# Patient Record
Sex: Male | Born: 1935
Health system: Southern US, Community
[De-identification: ages and names within clinical notes are randomized; demographics above are authoritative.]

## PROBLEM LIST (undated history)

## (undated) DIAGNOSIS — N189 Chronic kidney disease, unspecified: Secondary | ICD-10-CM

## (undated) DIAGNOSIS — R001 Bradycardia, unspecified: Secondary | ICD-10-CM

## (undated) DIAGNOSIS — D708 Other neutropenia: Secondary | ICD-10-CM

## (undated) DIAGNOSIS — D329 Benign neoplasm of meninges, unspecified: Secondary | ICD-10-CM

## (undated) DIAGNOSIS — G4762 Sleep related leg cramps: Secondary | ICD-10-CM

## (undated) DIAGNOSIS — Z8546 Personal history of malignant neoplasm of prostate: Secondary | ICD-10-CM

## (undated) DIAGNOSIS — G478 Other sleep disorders: Secondary | ICD-10-CM

## (undated) DIAGNOSIS — E785 Hyperlipidemia, unspecified: Secondary | ICD-10-CM

## (undated) DIAGNOSIS — G7000 Myasthenia gravis without (acute) exacerbation: Secondary | ICD-10-CM

## (undated) DIAGNOSIS — I82409 Acute embolism and thrombosis of unspecified deep veins of unspecified lower extremity: Secondary | ICD-10-CM

## (undated) DIAGNOSIS — R569 Unspecified convulsions: Secondary | ICD-10-CM

## (undated) DIAGNOSIS — I499 Cardiac arrhythmia, unspecified: Secondary | ICD-10-CM

## (undated) DIAGNOSIS — M47812 Spondylosis without myelopathy or radiculopathy, cervical region: Secondary | ICD-10-CM

## (undated) DIAGNOSIS — I4891 Unspecified atrial fibrillation: Secondary | ICD-10-CM

## (undated) DIAGNOSIS — T8859XA Other complications of anesthesia, initial encounter: Secondary | ICD-10-CM

## (undated) DIAGNOSIS — C801 Malignant (primary) neoplasm, unspecified: Secondary | ICD-10-CM

## (undated) DIAGNOSIS — G629 Polyneuropathy, unspecified: Secondary | ICD-10-CM

## (undated) DIAGNOSIS — L120 Bullous pemphigoid: Secondary | ICD-10-CM

## (undated) DIAGNOSIS — I10 Essential (primary) hypertension: Secondary | ICD-10-CM

## (undated) DIAGNOSIS — Z87442 Personal history of urinary calculi: Secondary | ICD-10-CM

## (undated) DIAGNOSIS — E041 Nontoxic single thyroid nodule: Secondary | ICD-10-CM

## (undated) DIAGNOSIS — M1990 Unspecified osteoarthritis, unspecified site: Secondary | ICD-10-CM

## (undated) HISTORY — DX: Myasthenia gravis without (acute) exacerbation: G70.00

## (undated) HISTORY — DX: Hyperlipidemia, unspecified: E78.5

## (undated) HISTORY — PX: PARTIAL COLECTOMY: SHX5273

## (undated) HISTORY — DX: Unspecified atrial fibrillation: I48.91

## (undated) HISTORY — DX: Nontoxic single thyroid nodule: E04.1

## (undated) HISTORY — PX: BRAIN SURGERY: SHX531

## (undated) HISTORY — DX: Personal history of malignant neoplasm of prostate: Z85.46

## (undated) HISTORY — DX: Other sleep disorders: G47.8

## (undated) HISTORY — DX: Bullous pemphigoid: L12.0

## (undated) HISTORY — PX: BLADDER SURGERY: SHX569

## (undated) HISTORY — DX: Sleep related leg cramps: G47.62

## (undated) HISTORY — DX: Unspecified convulsions: R56.9

## (undated) HISTORY — PX: RADIOACTIVE SEED IMPLANT: SHX5150

## (undated) HISTORY — PX: CHOLECYSTECTOMY: SHX55

## (undated) HISTORY — DX: Spondylosis without myelopathy or radiculopathy, cervical region: M47.812

---
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
## 1998-09-23 ENCOUNTER — Encounter: Payer: Self-pay | Admitting: Neurology

## 1998-09-23 ENCOUNTER — Ambulatory Visit (HOSPITAL_COMMUNITY): Admission: RE | Admit: 1998-09-23 | Discharge: 1998-09-23 | Payer: Self-pay | Admitting: Neurology

## 2001-02-08 ENCOUNTER — Encounter: Payer: Self-pay | Admitting: Emergency Medicine

## 2001-02-08 ENCOUNTER — Emergency Department (HOSPITAL_COMMUNITY): Admission: EM | Admit: 2001-02-08 | Discharge: 2001-02-08 | Payer: Self-pay | Admitting: Emergency Medicine

## 2001-04-25 ENCOUNTER — Ambulatory Visit (HOSPITAL_COMMUNITY): Admission: RE | Admit: 2001-04-25 | Discharge: 2001-04-25 | Payer: Self-pay | Admitting: General Surgery

## 2001-12-03 ENCOUNTER — Encounter: Payer: Self-pay | Admitting: Emergency Medicine

## 2001-12-03 ENCOUNTER — Emergency Department (HOSPITAL_COMMUNITY): Admission: EM | Admit: 2001-12-03 | Discharge: 2001-12-03 | Payer: Self-pay | Admitting: Emergency Medicine

## 2002-08-24 ENCOUNTER — Encounter: Payer: Self-pay | Admitting: Neurosurgery

## 2002-08-24 ENCOUNTER — Ambulatory Visit (HOSPITAL_COMMUNITY): Admission: RE | Admit: 2002-08-24 | Discharge: 2002-08-24 | Payer: Self-pay | Admitting: Neurosurgery

## 2003-04-17 ENCOUNTER — Ambulatory Visit (HOSPITAL_COMMUNITY): Admission: RE | Admit: 2003-04-17 | Discharge: 2003-04-17 | Payer: Self-pay | Admitting: Family Medicine

## 2004-08-29 ENCOUNTER — Ambulatory Visit (HOSPITAL_COMMUNITY): Admission: RE | Admit: 2004-08-29 | Discharge: 2004-08-29 | Payer: Self-pay | Admitting: Family Medicine

## 2005-04-16 ENCOUNTER — Other Ambulatory Visit: Admission: RE | Admit: 2005-04-16 | Discharge: 2005-04-16 | Payer: Self-pay | Admitting: Dermatology

## 2005-05-31 ENCOUNTER — Encounter: Admission: RE | Admit: 2005-05-31 | Discharge: 2005-05-31 | Payer: Self-pay | Admitting: Neurology

## 2005-09-03 ENCOUNTER — Ambulatory Visit (HOSPITAL_COMMUNITY): Admission: RE | Admit: 2005-09-03 | Discharge: 2005-09-03 | Payer: Self-pay | Admitting: Neurology

## 2005-11-20 ENCOUNTER — Encounter: Admission: RE | Admit: 2005-11-20 | Discharge: 2005-11-20 | Payer: Self-pay | Admitting: Neurology

## 2007-06-09 ENCOUNTER — Ambulatory Visit (HOSPITAL_COMMUNITY): Admission: RE | Admit: 2007-06-09 | Discharge: 2007-06-09 | Payer: Self-pay | Admitting: Family Medicine

## 2007-06-13 ENCOUNTER — Ambulatory Visit (HOSPITAL_COMMUNITY): Admission: RE | Admit: 2007-06-13 | Discharge: 2007-06-13 | Payer: Self-pay | Admitting: Family Medicine

## 2007-06-29 ENCOUNTER — Encounter (INDEPENDENT_AMBULATORY_CARE_PROVIDER_SITE_OTHER): Payer: Self-pay | Admitting: General Surgery

## 2007-06-29 ENCOUNTER — Ambulatory Visit (HOSPITAL_COMMUNITY): Admission: RE | Admit: 2007-06-29 | Discharge: 2007-06-30 | Payer: Self-pay | Admitting: General Surgery

## 2008-03-07 ENCOUNTER — Ambulatory Visit: Admission: RE | Admit: 2008-03-07 | Discharge: 2008-03-20 | Payer: Self-pay | Admitting: Radiation Oncology

## 2008-04-02 ENCOUNTER — Ambulatory Visit (HOSPITAL_COMMUNITY): Admission: RE | Admit: 2008-04-02 | Discharge: 2008-04-02 | Payer: Self-pay | Admitting: General Surgery

## 2008-04-26 ENCOUNTER — Ambulatory Visit: Admission: RE | Admit: 2008-04-26 | Discharge: 2008-07-25 | Payer: Self-pay | Admitting: Radiation Oncology

## 2008-05-17 ENCOUNTER — Ambulatory Visit (HOSPITAL_COMMUNITY): Admission: RE | Admit: 2008-05-17 | Discharge: 2008-05-17 | Payer: Self-pay | Admitting: Family Medicine

## 2008-06-29 ENCOUNTER — Ambulatory Visit (HOSPITAL_BASED_OUTPATIENT_CLINIC_OR_DEPARTMENT_OTHER): Admission: RE | Admit: 2008-06-29 | Discharge: 2008-06-29 | Payer: Self-pay | Admitting: Urology

## 2008-08-06 ENCOUNTER — Ambulatory Visit: Admission: RE | Admit: 2008-08-06 | Discharge: 2008-09-04 | Payer: Self-pay | Admitting: Radiation Oncology

## 2010-07-24 LAB — COMPREHENSIVE METABOLIC PANEL
Chloride: 102 mEq/L (ref 96–112)
Creatinine, Ser: 0.63 mg/dL (ref 0.4–1.5)
GFR calc Af Amer: >60 mL/min (ref >60)
Glucose, Bld: 95 mg/dL (ref 70–99)
Potassium: 4.7 mEq/L (ref 3.5–5.1)
Total Bilirubin: 0.8 mg/dL (ref 0.3–1.2)

## 2010-07-24 LAB — PROTIME-INR: INR: 1 (ref 0.00–1.49)

## 2010-07-24 LAB — APTT: aPTT: 26 seconds (ref 24–37)

## 2010-07-24 LAB — CBC
HCT: 45.3 % (ref 39.0–52.0)
Hemoglobin: 15.7 g/dL (ref 13.0–17.0)
Platelets: 187 10*3/uL (ref 150–400)
RDW: 12.9 % (ref 11.5–15.5)

## 2010-08-26 NOTE — H&P (Signed)
NAMETAVIS, KRING NO.:  [id number]   MEDICAL RECORD NO.:  [id number]          PATIENT TYPE:  AMB   LOCATION:  DAY                           FACILITY:  [facility]   PHYSICIAN:  Dalia Heading, M.D.  DATE OF BIRTH:  11-05-1934   DATE OF ADMISSION:  04/02/2008  DATE OF DISCHARGE:  LH                              HISTORY & PHYSICAL   CHIEF COMPLAINT:  Hematochezia.   HISTORY OF PRESENT ILLNESS:  The patient is a 76 year old white male who  was referred for endoscopic evaluation.  He needs a colonoscopy for  recent hematochezia.  He noted some blood on the toilet paper when he  wiped himself.  He is about to undergo radiation seed placement for  prostate carcinoma.  No abdominal pain, weight loss, nausea, vomiting,  diarrhea, constipation, melena.  Has been noted he did have a history  diverticulosis as per our colonoscopy in 2003.  There is no family  history of colon carcinoma.   PAST MEDICAL HISTORY:  Seizure disorder, hypertension, prostate cancer,  bone pain, myasthenia gravis.   PAST SURGICAL HISTORY:  Craniotomy, cholecystectomy.   CURRENT MEDICATIONS:  Dilantin, atenolol, diclofenac, simvastatin,  pyridostigmine, niacin, tetracycline.   ALLERGIES:  SULFA, IV DYE.   REVIEW OF SYSTEMS:  Noncontributory.   PHYSICAL EXAMINATION:  GENERAL:  The patient is a well-developed, well-  nourished white male in no acute distress.  LUNGS:  Clear to auscultation with equal breath sounds bilaterally.  HEART:  Regular rate and rhythm without S3, S4 or murmurs.  ABDOMEN:  Soft, nontender, nondistended.  No hepatosplenomegaly or  masses noted.  RECTAL:  Deferred to the procedure.   IMPRESSION:  Hematochezia.   PLAN:  The patient is scheduled for a colonoscopy on April 02, 2008.  The risks and benefits of the procedure including bleeding and  perforation were fully explained to the patient, who gave informed  consent.      Dalia Heading, M.D.  Electronically Signed     MAJ/MEDQ  D:  03/22/2008  T:  03/23/2008  Job:  [id number]   cc:   Kirk Ruths, M.D.  Fax: [phone]   [facility]  Fax: [phone]

## 2010-08-26 NOTE — H&P (Signed)
[name], [name] NO.:  [id number]   MEDICAL RECORD NO.:  [id number]          PATIENT TYPE:  AMB   LOCATION:  DAY                           FACILITY:  [facility]   PHYSICIAN:  Dalia Heading, M.D.  DATE OF BIRTH:  04/16/1934   DATE OF ADMISSION:  04/02/2008  DATE OF DISCHARGE:  LH                              HISTORY & PHYSICAL   CHIEF COMPLAINT:  Hematochezia.   HISTORY OF PRESENT ILLNESS:  The patient is a 76 year old white male who  was referred for endoscopic evaluation.  He needs a colonoscopy for  recent hematochezia.  He noted some blood on the toilet paper when he  wiped himself.  He is about to undergo radiation seed placement for  prostate carcinoma.  No abdominal pain, weight loss, nausea, vomiting,  diarrhea, constipation, melena.  Has been noted he did have a history  diverticulosis as per our colonoscopy in 2003.  There is no family  history of colon carcinoma.   PAST MEDICAL HISTORY:  Seizure disorder, hypertension, prostate cancer,  bone pain, myasthenia gravis.   PAST SURGICAL HISTORY:  Craniotomy, cholecystectomy.   CURRENT MEDICATIONS:  Dilantin, atenolol, diclofenac, simvastatin,  pyridostigmine, niacin, tetracycline.   ALLERGIES:  SULFA, IV DYE.   REVIEW OF SYSTEMS:  Noncontributory.   PHYSICAL EXAMINATION:  GENERAL:  The patient is a well-developed, well-  nourished white male in no acute distress.  LUNGS:  Clear to auscultation with equal breath sounds bilaterally.  HEART:  Regular rate and rhythm without S3, S4 or murmurs.  ABDOMEN:  Soft, nontender, nondistended.  No hepatosplenomegaly or  masses noted.  RECTAL:  Deferred to the procedure.   IMPRESSION:  Hematochezia.   PLAN:  The patient is scheduled for a colonoscopy on April 02, 2008.  The risks and benefits of the procedure including bleeding and  perforation were fully explained to the patient, who gave informed  consent.      Dalia Heading, M.D.     MAJ/MEDQ  D:  03/22/2008  T:  03/23/2008  Job:  [id number]   cc:   Kyle [name], M.D.  Fax: [phone]   [name]  Fax: [phone]

## 2010-08-26 NOTE — Op Note (Signed)
NAMEALBARO, DEVINEY NO.:  [id number]   MEDICAL RECORD NO.:  [id number]          PATIENT TYPE:  OIB   LOCATION:  A326                          FACILITY:  [facility]   PHYSICIAN:  Barbaraann Barthel, M.D. DATE OF BIRTH:  May 01, 1934   DATE OF PROCEDURE:  06/29/2007  DATE OF DISCHARGE:                               OPERATIVE REPORT   SURGEON:  Barbaraann Barthel, M.D.   PREOPERATIVE DIAGNOSES:  1. Cholecystitis.  2. Cholelithiasis.   POSTOPERATIVE DIAGNOSES:  1. Cholecystitis.  2. Cholelithiasis.   PROCEDURE:  Laparoscopic cholecystectomy (no cholangiogram).   SPECIMEN:  Gallbladder with stones.   NOTE:  This is a 76 year old white male who was self-referred for a  history of almost 2 years of right upper quadrant pain accompanied with  bouts of nausea and vomiting.  His liver function was grossly within  normal limits.  A sonogram showed the presence of cholelithiasis.  We  discussed laparoscopic cholecystectomy with him in detail, discussing  complications not limited to but including bleeding, infection, damage  to bile ducts, perforation of organs and transitory diarrhea and the  possibility that open surgery might be required.   Informed consent was obtained.   TECHNIQUE:  The patient was placed in supine position.  After the  adequate administration of general anesthesia via endotracheal  intubation, his entire abdomen was prepped with Betadine solution and  draped in the usual manner.  Prior to this a Foley catheter was  aseptically inserted.  With the patient Trendelenburg, a periumbilical  incision was carried out over the superior aspect of the umbilicus.  The  fascia was identified and elevated with a sharp towel clip and then a  Veress needle was inserted and confirmed in position with a saline drop  test.  We then placed an 11-mm cannula in the umbilicus incision and  then under direct vision using the Visiport technique, an 11-mm cannulas  placed  the epigastrium and the two 5-mm cannulas were placed in the  right upper quadrant laterally.  The gallbladder was grasped.  Its  adhesions were taken down.  The cystic duct was clearly visualized and  triply silver-clipped on the side of the common bile duct and singly  silver-clipped on the side of the gallbladder and divided.  Likewise the  cystic artery was visualized and clipped.  The gallbladder was then  removed uneventfully from the liver bed.  There was a small amount of  oozing, which was easily controlled with a cautery device.  The  gallbladder was then removed with minimal spillage with the Endosac  device.  We checked for hemostasis, irrigated with normal saline  solution, and I elected to leave Surgicel in the liver bed and the  Jackson-Pratt drain to drain the liver bed as well.  The abdomen was  then desufflated.  The port sites were the 11-mm cannulas were placed in  the epigastrium and in the umbilicus area were closed with 0 Polysorb  and 0.5% Sensorcaine was used at all port sites to  help with postoperative comfort.  The drain was sutured in place with 3-  0 nylon.  Prior to closure all sponge, needle and instrument counts were  found to be correct.  Estimated blood loss was minimal.  The patient  received 1100 mL of crystalloids intraoperatively.  There were no  complications.      Barbaraann Barthel, M.D.  Electronically Signed     WB/MEDQ  D:  06/29/2007  T:  06/29/2007  Job:  [id number]   cc:   Kirk Ruths, M.D.  Fax: [phone]

## 2010-08-26 NOTE — Op Note (Signed)
NAMECHEVON, LAUFER               ACCOUNT NO.:  [id number]   MEDICAL RECORD NO.:  [id number]          PATIENT TYPE:  AMB   LOCATION:  NESC                         FACILITY:  [facility]   PHYSICIAN:  Ronald L. Earlene Plater, M.D.  DATE OF BIRTH:  1934-12-13   DATE OF PROCEDURE:  06/29/2008  DATE OF DISCHARGE:                               OPERATIVE REPORT   DIAGNOSIS:  Adenocarcinoma of the prostate.   OPERATIVE PROCEDURE:  Robotic arm Nucletron transperineal implantation  of iodine 125 seeds into the prostate and flexible cystourethroscopy.   SURGEON:  Lucrezia Starch. Earlene Plater, M.D.   ASSISTANT:  Artist Pais. Kathrynn Running, M.D.   ANESTHESIA:  LMA.   ESTIMATED BLOOD LOSS:  15 mL.   TUBES:  16 French Foley.   COMPLICATIONS:  None.   A total of 86 iodine 125 seeds were implanted with 24 needles at 42.7420  mCi total apparent activity.   INDICATIONS FOR PROCEDURE:  Mr. Liptak is a very nice 76 year old white  male who has myasthenia gravis.  He had had a low-grade prostate cancer  and underwent watchful waiting with curative intent.  The PSA increased  to 6.04 and a subsequent biopsy in November of 2009 revealed increased  volume of Gleason score 6, 3 plus 3 adenocarcinoma.  He has considered  all options and after understanding risks, benefits and alternatives he  has elected to proceed with the above procedure.   PROCEDURE IN DETAIL:  The patient was placed in supine position.  After  proper LMA anesthesia was placed in the dorsal lithotomy position and  prepped and draped with Betadine in a sterile fashion.  A 16 French  Foley catheter was inserted and the bladder was drained.  The B and K  biplanar transrectal ultrasound probe was placed and both axial and  sagittal scans were performed of the prostate and 3-dimensionally.  Simulation was then performed and compared with preoperative simulation  and dosimetry was calculated for the urethra, the rectum and the  prostate and we were very comfortable  with that dosing.  Two both  electronic and physical grid were placed.  Two holding needles were  placed in unused coordinates.  The initial needle was set at the base  with the robotic arm Nucletron and serial implantation was performed  utilizing the robotic arm Nucletron.  A total of 86 seeds were placed  with 24 needles at 42.7420 mCi total apparent activity.  Following this  all hardware was removed.  The wound was dressed sterilely and the  patient was placed in a supine position.  A static image was obtained  fluoroscopically and we were very comfortable with the localization of  the seeds.  The Foley catheter was removed and scanned for seeds and  there were none within it.  Flexible cystourethroscopy was then  performed and noted to have mild trilobar hypertrophy, grade 1  trabeculation was noted.  Efflux of clear urine was noted from the  normally placed ureteral  orifices bilaterally and there were no seeds or spacers or lesions noted  in the bladder or the urethra.  The flexible  cystourethroscope was  visually removed and a new 16 French Foley catheter was inserted and the  bladder was drained.  The patient tolerated the procedure well.  No  complications.  He was taken to the recovery room stable.      Ronald L. Earlene Plater, M.D.  Electronically Signed     RLD/MEDQ  D:  06/29/2008  T:  06/29/2008  Job:  [id number]

## 2011-01-05 LAB — DIFFERENTIAL
Basophils Absolute: 0
Basophils Absolute: 0.1
Basophils Relative: 0
Basophils Relative: 1
Eosinophils Absolute: 0.1
Eosinophils Relative: 2
Lymphocytes Relative: 27
Lymphs Abs: 1.9
Monocytes Absolute: 0.7
Monocytes Absolute: 1
Neutro Abs: 8.3 — ABNORMAL HIGH
Neutrophils Relative %: 76

## 2011-01-05 LAB — BASIC METABOLIC PANEL
BUN: 9
CO2: 29
Creatinine, Ser: 0.61
GFR calc non Af Amer: >60
Potassium: 4
Sodium: 139

## 2011-01-05 LAB — CBC
HCT: 45.2
Hemoglobin: 13.3
Hemoglobin: 16.1
MCHC: 35.2
MCHC: 35.7
MCV: 92.7
MCV: 94.5
Platelets: 184
WBC: 10.9 — ABNORMAL HIGH
WBC: 9.1

## 2011-01-05 LAB — HEPATIC FUNCTION PANEL
AST: 51 — ABNORMAL HIGH
Alkaline Phosphatase: 76
Alkaline Phosphatase: 97
Total Protein: 7.5

## 2011-01-05 LAB — AMYLASE: Amylase: 103

## 2011-01-27 ENCOUNTER — Other Ambulatory Visit: Payer: Self-pay | Admitting: Dermatology

## 2011-12-24 ENCOUNTER — Other Ambulatory Visit: Payer: Self-pay | Admitting: Dermatology

## 2012-08-17 ENCOUNTER — Other Ambulatory Visit: Payer: Self-pay | Admitting: Dermatology

## 2012-11-17 ENCOUNTER — Other Ambulatory Visit: Payer: Self-pay | Admitting: Dermatology

## 2012-12-21 ENCOUNTER — Emergency Department (HOSPITAL_COMMUNITY): Payer: No Typology Code available for payment source

## 2012-12-21 ENCOUNTER — Encounter (HOSPITAL_COMMUNITY): Payer: Self-pay

## 2012-12-21 ENCOUNTER — Emergency Department (HOSPITAL_COMMUNITY)
Admission: EM | Admit: 2012-12-21 | Discharge: 2012-12-21 | Disposition: A | Discharge status: Home / Self Care | Payer: No Typology Code available for payment source | Attending: Emergency Medicine | Admitting: Emergency Medicine

## 2012-12-21 DIAGNOSIS — Z86011 Personal history of benign neoplasm of the brain: Secondary | ICD-10-CM | POA: Insufficient documentation

## 2012-12-21 DIAGNOSIS — S0993XA Unspecified injury of face, initial encounter: Secondary | ICD-10-CM | POA: Insufficient documentation

## 2012-12-21 DIAGNOSIS — Z859 Personal history of malignant neoplasm, unspecified: Secondary | ICD-10-CM | POA: Insufficient documentation

## 2012-12-21 DIAGNOSIS — Z79899 Other long term (current) drug therapy: Secondary | ICD-10-CM | POA: Insufficient documentation

## 2012-12-21 DIAGNOSIS — S0990XA Unspecified injury of head, initial encounter: Secondary | ICD-10-CM | POA: Insufficient documentation

## 2012-12-21 DIAGNOSIS — Z791 Long term (current) use of non-steroidal anti-inflammatories (NSAID): Secondary | ICD-10-CM | POA: Insufficient documentation

## 2012-12-21 DIAGNOSIS — E041 Nontoxic single thyroid nodule: Secondary | ICD-10-CM | POA: Insufficient documentation

## 2012-12-21 DIAGNOSIS — M129 Arthropathy, unspecified: Secondary | ICD-10-CM | POA: Insufficient documentation

## 2012-12-21 DIAGNOSIS — Z9889 Other specified postprocedural states: Secondary | ICD-10-CM | POA: Insufficient documentation

## 2012-12-21 DIAGNOSIS — Y92410 Unspecified street and highway as the place of occurrence of the external cause: Secondary | ICD-10-CM | POA: Insufficient documentation

## 2012-12-21 DIAGNOSIS — S6990XA Unspecified injury of unspecified wrist, hand and finger(s), initial encounter: Secondary | ICD-10-CM | POA: Insufficient documentation

## 2012-12-21 DIAGNOSIS — Y9389 Activity, other specified: Secondary | ICD-10-CM | POA: Insufficient documentation

## 2012-12-21 DIAGNOSIS — IMO0002 Reserved for concepts with insufficient information to code with codable children: Secondary | ICD-10-CM | POA: Insufficient documentation

## 2012-12-21 DIAGNOSIS — I10 Essential (primary) hypertension: Secondary | ICD-10-CM | POA: Insufficient documentation

## 2012-12-21 HISTORY — DX: Essential (primary) hypertension: I10

## 2012-12-21 HISTORY — DX: Unspecified osteoarthritis, unspecified site: M19.90

## 2012-12-21 HISTORY — DX: Other neutropenia: D70.8

## 2012-12-21 HISTORY — DX: Malignant (primary) neoplasm, unspecified: C80.1
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
## 2012-12-23 ENCOUNTER — Ambulatory Visit (HOSPITAL_COMMUNITY)
Admit: 2012-12-23 | Discharge: 2012-12-23 | Disposition: A | Discharge status: Home / Self Care | Payer: Medicare Other | Source: Ambulatory Visit | Attending: Emergency Medicine | Admitting: Emergency Medicine

## 2012-12-23 DIAGNOSIS — E041 Nontoxic single thyroid nodule: Secondary | ICD-10-CM | POA: Insufficient documentation
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
## 2012-12-29 NOTE — ED Provider Notes (Signed)
Medical screening examination/treatment/procedure(s) were performed by non-physician practitioner and as supervising physician I was immediately available for consultation/collaboration. Mani Celestin, MD, FACEP   Laquinn Shippy L Jewelz Kobus, MD 12/29/12 0843 

## 2012-12-30 NOTE — ED Provider Notes (Signed)
3:08 PM  Spoke with Dwyane Luo,  PA-C with [facility].  Discussed US findings at need for biopsy of thyroid nodule.  Kyle [name] will arrange for f/u procedure and further management of this thyroid finding.  Burgess Amor, PA-C 12/30/12 1511

## 2013-01-04 NOTE — ED Provider Notes (Signed)
Medical screening examination/treatment/procedure(s) were performed by non-physician practitioner and as supervising physician I was immediately available for consultation/collaboration. Berneita Sanagustin, MD, FACEP   Redina Zeller L Rebecca Motta, MD 01/04/13 0823 

## 2013-01-12 ENCOUNTER — Other Ambulatory Visit (HOSPITAL_COMMUNITY): Payer: Self-pay | Admitting: Specialist

## 2013-01-12 DIAGNOSIS — E041 Nontoxic single thyroid nodule: Secondary | ICD-10-CM

## 2013-01-23 ENCOUNTER — Encounter: Payer: Self-pay | Admitting: Neurology

## 2013-01-23 ENCOUNTER — Other Ambulatory Visit (HOSPITAL_COMMUNITY): Payer: Self-pay | Admitting: "Endocrinology

## 2013-01-23 DIAGNOSIS — E041 Nontoxic single thyroid nodule: Secondary | ICD-10-CM

## 2013-01-24 ENCOUNTER — Ambulatory Visit (HOSPITAL_COMMUNITY): Admission: RE | Admit: 2013-01-24 | Payer: Medicare Other | Source: Ambulatory Visit

## 2013-01-26 ENCOUNTER — Ambulatory Visit (HOSPITAL_COMMUNITY): Admission: RE | Admit: 2013-01-26 | Payer: Medicare Other | Source: Ambulatory Visit

## 2013-01-26 ENCOUNTER — Encounter (HOSPITAL_COMMUNITY): Payer: Self-pay

## 2013-01-26 ENCOUNTER — Ambulatory Visit (HOSPITAL_COMMUNITY)
Admission: RE | Admit: 2013-01-26 | Discharge: 2013-01-26 | Disposition: A | Discharge status: Home / Self Care | Payer: Medicare Other | Source: Ambulatory Visit | Attending: "Endocrinology | Admitting: "Endocrinology

## 2013-01-26 DIAGNOSIS — E0789 Other specified disorders of thyroid: Secondary | ICD-10-CM | POA: Insufficient documentation

## 2013-01-26 DIAGNOSIS — E041 Nontoxic single thyroid nodule: Secondary | ICD-10-CM

## 2013-01-26 MED ORDER — LIDOCAINE HCL (PF) 2 % IJ SOLN
INTRAMUSCULAR | Status: AC
  Administered 2013-01-26: 10 mL
  Filled 2013-01-26: qty 10

## 2013-01-26 MED ORDER — LIDOCAINE HCL (PF) 2 % IJ SOLN
10.0000 mL | Freq: Once | INTRAMUSCULAR | Status: AC
  Administered 2013-01-26: 10 mL

## 2013-01-26 NOTE — Progress Notes (Signed)
Thyroid biopsy complete no signs of distress.  

## 2013-01-26 NOTE — Procedures (Signed)
PreOperative Dx: LEFT thyroid mass Postoperative Dx: LEFT thyroid mass Procedure:   US guided FNA of LEFT thyroid nodule Radiologist:  Tyron Russell Anesthesia:  2 ml of 2 lidocaine Specimen:  FNA x 3  EBL:   None Complications: None

## 2013-02-14 ENCOUNTER — Encounter: Payer: Self-pay | Admitting: Neurology

## 2013-02-14 ENCOUNTER — Ambulatory Visit (INDEPENDENT_AMBULATORY_CARE_PROVIDER_SITE_OTHER): Payer: Medicare Other | Admitting: Neurology

## 2013-02-14 VITALS — BP 162/76 | HR 60 | Wt 185.0 lb

## 2013-02-14 DIAGNOSIS — G7000 Myasthenia gravis without (acute) exacerbation: Secondary | ICD-10-CM

## 2013-02-14 DIAGNOSIS — R569 Unspecified convulsions: Secondary | ICD-10-CM | POA: Insufficient documentation

## 2013-02-14 HISTORY — DX: Myasthenia gravis without (acute) exacerbation: G70.00

## 2013-02-14 NOTE — Patient Instructions (Signed)
Myasthenia Gravis Myasthenia gravis is a disease that causes muscle weakness throughout the body. The muscles affected are the ones we can control (voluntary muscles). An example of a voluntary muscle is your hand muscles. You can control the muscles to make the hand pick something up. An example of an involuntary muscle is the heart. The heart beats without any direction from you.  Myasthenia Gravis is thought to be an autoimmune disease. That means that normal defenses of the body begin to attack the body. In this case, the immune system begins to attack cells located at the junctions of the muscles and the nerves. Women are affected more often. Women are affected at a younger age than men. Babies born to affected women frequently develop symptoms at an early age. SYMPTOMS Initially in the disease, the facial muscles are affected first. After this, a person may develop droopy eyelids. They may have difficulty controlling facial muscles. They may have problems chewing. Swallowing and speaking may become impaired. The weakness gradually spreads to the arms and legs. It begins to affect breathing. Sometimes, the symptoms lessen or go away without any apparent cause. DIAGNOSIS  Diagnosis can be made with blood tests. Tests such as electromyography may be done to examine the electrical activity in the muscle. An improvement in symptoms after having an anti-cholinesterase drug helps confirm the diagnosis.  TREATMENT  Medicines are usually prescribed as the first treatment. These medicines help, but they do not cure the disease. A plasma cleansing procedure (plasmapheresis) can be used to treat a crisis. It can also be used to prepare a person for surgery. This procedure produces short-term improvement. Some cases are helped by removing the thymus gland. Steroids are used for short-term benefits. Document Released: 07/06/2000 Document Revised: 06/22/2011 Document Reviewed: 03/30/2005 [employer] Patient  Information 2014 [employer].  

## 2013-02-14 NOTE — Progress Notes (Signed)
Reason for visit: Myasthenia gravis, seizures  Kyle Fera. is an 78 y.o. male  History of present illness:  Kyle [name] is a 78 year old right-handed white male with a history of myasthenia gravis with ocular features, and a history of seizures that are under good control. The patient is on Dilantin, and he takes low-dose Mestinon for the myasthenia. The patient does have some ptosis affecting the right eye on occasion, but he is not clear that this ever closes the eye down completely. The patient denies any double vision. The patient denies any problems with chewing or swallowing. The patient has some difficulty with standing from a squatting position, otherwise he does not note any significant issues with strength of the arms or legs. The patient denies any gait disturbance, or falls. The patient has nocturnal leg cramps occurring once or twice a week, affecting mainly the calf muscles. The patient has to get up and stretch frequently. The patient otherwise has not noted any new medical issues that have come up since last seen. The patient was found to have benign thyroid nodules, and he required a biopsy.  Past Medical History  Diagnosis Date  . Hypertension   . Auto immune neutropenia   . Cancer   . Arthritis   . Ocular myasthenia gravis   . Seizures   . Sleep paralysis   . Dyslipidemia   . History of prostate cancer   . Bullous pemphigoid   . Myasthenia 02/14/2013  . Nocturnal leg cramps   . Thyroid nodule, cold     Past Surgical History  Procedure Laterality Date  . Brain surgery    . Cholecystectomy    . Radioactive seed implant      Family History  Problem Relation Age of Onset  . Heart failure Daughter   . Heart failure Mother   . Diabetes Mother   . Diabetes Sister   . Lung cancer Son     Social history:  reports that he has never smoked. He has never used smokeless tobacco. He reports that he does not drink alcohol or use illicit drugs.    Allergies   Allergen Reactions  . Bactericin [Bacitracin]   . Iohexol      Code: HIVES, Desc: pt was premedicated/hives reaction occurred 24 hours post injection, Onset Date: 16109604   . Sulfa Antibiotics Swelling and Rash    Medications:  Current Outpatient Prescriptions on File Prior to Visit  Medication Sig Dispense Refill  . atenolol (TENORMIN) 25 MG tablet Take 25 mg by mouth daily.      . clobetasol (TEMOVATE) 0.05 % external solution Apply 1 application topically daily as needed (rash).       . diclofenac (VOLTAREN) 75 MG EC tablet Take 75 mg by mouth 2 (two) times daily.      Marland Kitchen DILANTIN 100 MG ER capsule Take 200 mg by mouth 2 (two) times daily.      . fish oil-omega-3 fatty acids 1000 MG capsule Take 1 g by mouth 3 (three) times daily.      . Garlic (GARLIQUE PO) Take 1 tablet by mouth daily.      . niacinamide 500 MG tablet Take 500 mg by mouth 3 (three) times daily.      . predniSONE (DELTASONE) 1 MG tablet Take 4 mg by mouth every other day.      . pyridostigmine (MESTINON) 60 MG tablet Take 30 mg by mouth 3 (three) times daily.      Marland Kitchen  simvastatin (ZOCOR) 20 MG tablet Take 20 mg by mouth daily.      . tamsulosin (FLOMAX) 0.4 MG CAPS capsule Take 0.4 mg by mouth daily.      . TOVIAZ 8 MG TB24 tablet Take 8 mg by mouth daily.       No current facility-administered medications on file prior to visit.    ROS:  Out of a complete 14 system review of symptoms, the patient complains only of the following symptoms, and all other reviewed systems are negative.  Muscle cramps Restless legs  Blood pressure 162/76, pulse 60, weight 185 lb (83.915 kg).  Physical Exam  General: The patient is alert and cooperative at the time of the examination.  Skin: No significant peripheral edema is noted.   Neurologic Exam  Mental status: The patient is oriented x 3.  Cranial nerves: Facial symmetry is not present. Mild ptosis is noted on the right. With superior gaze for 1 minute, there is  some increase in the right sided ptosis. No reports of double vision is noted. Speech is normal, no aphasia or dysarthria is noted. Extraocular movements are full. Visual fields are full. No facial weakness or weakness with jaw opening and closure or head turning or shoulder shrug is noted.  Motor: The patient has good strength in all 4 extremities. With the arms outstretched for 1 minute, no fatigable weakness of the deltoid muscles is noted.  Sensory examination: Soft touch sensation is symmetric on the face, arms, and legs.  Coordination: The patient has good finger-nose-finger and heel-to-shin bilaterally.  Gait and station: The patient has a normal gait. Tandem gait is normal. Romberg is negative. No drift is seen. The patient is able to arise from a squatting position.  Reflexes: Deep tendon reflexes are symmetric.   Assessment/Plan:  1. History of seizures, under good control  2. Myasthenia gravis with ocular features  3. Nocturnal leg cramps  The patient does not wish to go on medications for the nocturnal leg cramps such as baclofen. The patient may try low-dose magnesium, although this may adversely affect the myasthenia. The patient is doing well with the myasthenia this time. The patient not had any recurrent seizures, and he indicates that his primary care physician has checked blood work in March of 2014. The patient will otherwise followup in one year. The patient may adjust the dosing of the Mestinon as needed.  Marlan Palau MD 02/14/2013 3:48 PM  [facility] [address] [place], [place] [address]  Phone [phone] Fax [phone]

## 2013-05-22 ENCOUNTER — Other Ambulatory Visit: Payer: Self-pay

## 2013-05-22 MED ORDER — DICLOFENAC SODIUM 75 MG PO TBEC: 75.0000 mg | DELAYED_RELEASE_TABLET | Freq: Two times a day (BID) | ORAL | Status: DC

## 2013-05-22 MED ORDER — DILANTIN INFATABS 50 MG PO CHEW: 25.0000 mg | CHEWABLE_TABLET | Freq: Two times a day (BID) | ORAL | Status: DC

## 2013-05-22 MED ORDER — ATENOLOL 25 MG PO TABS: 25.0000 mg | ORAL_TABLET | Freq: Every day | ORAL | Status: DC

## 2013-05-22 MED ORDER — DILANTIN 100 MG PO CAPS: 200.0000 mg | ORAL_CAPSULE | Freq: Two times a day (BID) | ORAL | Status: DC

## 2013-05-22 NOTE — Telephone Encounter (Signed)
All of the meds sent were previously prescribed in [facility].

## 2013-07-12 NOTE — H&P (Signed)
  NTS SOAP Note  Vital Signs:  Vitals as of: 2/83/6629: Systolic 476: Diastolic 73: Heart Rate 48: Temp 96.45F: Height 62ft 8in: Weight 177Lbs 0 Ounces: BMI 26.91  BMI : 26.91 kg/m2  Subjective: This 79 Years 22 Months old Male presents for of loose bowel movements.  Some diarrhea noted.  No blood in stools.  Was told to get TCS due to radiation seeds placed for prostate cancer.  No family h/o colon cancer.  Review of Symptoms:  Constitutional:unremarkable   Head:unremarkable    Eyes:unremarkable   Nose/Mouth/Throat:unremarkable Cardiovascular:  unremarkable   Respiratory:unremarkable   Genitourinary:unremarkable     Musculoskeletal:unremarkable   Skin:unremarkable Hematolgic/Lymphatic:unremarkable  negative   Allergic/Immunologic:unremarkablenegative     Past Medical History:  Obtained  Reviewed  Past Medical History  Surgical History: prostate seeds, brain tumor, cholecystectomy Medical Problems: prostate cancer, seizure disorder, HTN Allergies: sulfa, iv contrast Medications: atenolol, dilantin, diclofenac, minocycline, pyrastigmine, prednisone, toviaz, tamsulosin, simvastatin, fish oil   Social History:ObtainedReviewed  Social History  Preferred Language: English Race:  White Ethnicity: Not Hispanic / Latino Age: 79 Years 7 Months Marital Status:  M Alcohol:  No Recreational drug(s):  No   Smoking Status: Never smoker reviewed on 07/11/2013 Functional Status reviewed on 07/11/2013 ------------------------------------------------ Bathing: Normal Cooking: Normal Dressing: Normal Driving: Normal Eating: Normal Managing Meds: Normal Oral Care: Normal Shopping: Normal Toileting: Normal Transferring: Normal Walking: Normal Cognitive Status reviewed on 07/11/2013 ------------------------------------------------ Attention: Normal Decision Making: Normal Language: Normal Memory: Normal Motor: Normal Perception:  Normal Problem Solving: Normal Visual and Spatial: Normal   Family History:  Reviewed  Family Health History Family History is Unknown    Objective Information: General:  Well appearing, well nourished in no distress. Neck:  Supple without lymphadenopathy.  Heart:  RRR, no murmur or gallop.  Normal S1, S2.  No S3, S4.  Lungs:    CTA bilaterally, no wheezes, rhonchi, rales.  Breathing unlabored. Abdomen:Soft, NT/ND, no HSM, no masses.   deferred to procedure  Assessment:Diarrhea  Diagnoses: 787.91 Diarrhea symptom (Diarrhea, unspecified)  Procedures: 99213 - OFFICE OUTPATIENT VISIT 15 MINUTES    Plan:  Scheduled for TCS on 07/25/13.   Patient Education:Alternative treatments to surgery were discussed with patient (and family).  Risks and benefits  of procedure were fully explained to the patient (and family) who gave informed consent. Patient/family questions were addressed.  Follow-up:Pending Surgery

## 2013-07-13 ENCOUNTER — Encounter (HOSPITAL_COMMUNITY): Payer: Self-pay | Admitting: Pharmacy Technician

## 2013-07-25 ENCOUNTER — Encounter (HOSPITAL_COMMUNITY)
Admission: RE | Disposition: A | Discharge status: Home / Self Care | Payer: Self-pay | Source: Ambulatory Visit | Attending: General Surgery

## 2013-07-25 ENCOUNTER — Ambulatory Visit (HOSPITAL_COMMUNITY)
Admission: RE | Admit: 2013-07-25 | Discharge: 2013-07-25 | Disposition: A | Discharge status: Home / Self Care | Payer: Medicare Other | Source: Ambulatory Visit | Attending: General Surgery | Admitting: General Surgery

## 2013-07-25 ENCOUNTER — Encounter (HOSPITAL_COMMUNITY): Payer: Self-pay | Admitting: *Deleted

## 2013-07-25 DIAGNOSIS — I10 Essential (primary) hypertension: Secondary | ICD-10-CM | POA: Insufficient documentation

## 2013-07-25 DIAGNOSIS — Z1211 Encounter for screening for malignant neoplasm of colon: Secondary | ICD-10-CM | POA: Insufficient documentation

## 2013-07-25 DIAGNOSIS — K5730 Diverticulosis of large intestine without perforation or abscess without bleeding: Secondary | ICD-10-CM | POA: Insufficient documentation

## 2013-07-25 DIAGNOSIS — Z79899 Other long term (current) drug therapy: Secondary | ICD-10-CM | POA: Insufficient documentation

## 2013-07-25 DIAGNOSIS — R569 Unspecified convulsions: Secondary | ICD-10-CM | POA: Insufficient documentation

## 2013-07-25 HISTORY — PX: COLONOSCOPY: SHX5424

## 2013-07-25 SURGERY — COLONOSCOPY
Anesthesia: Moderate Sedation

## 2013-07-25 MED ORDER — SODIUM CHLORIDE 0.9 % IV SOLN: INTRAVENOUS | Status: DC

## 2013-07-25 MED ORDER — MEPERIDINE HCL 50 MG/ML IJ SOLN
INTRAMUSCULAR | Status: AC
  Filled 2013-07-25: qty 1

## 2013-07-25 MED ORDER — MEPERIDINE HCL 50 MG/ML IJ SOLN
INTRAMUSCULAR | Status: DC | PRN
  Administered 2013-07-25: 50 mg via INTRAVENOUS

## 2013-07-25 MED ORDER — SIMETHICONE 40 MG/0.6ML PO SUSP
ORAL | Status: DC | PRN
  Administered 2013-07-25: 09:00:00

## 2013-07-25 MED ORDER — MIDAZOLAM HCL 5 MG/5ML IJ SOLN
INTRAMUSCULAR | Status: AC
  Filled 2013-07-25: qty 5

## 2013-07-25 MED ORDER — MIDAZOLAM HCL 5 MG/5ML IJ SOLN
INTRAMUSCULAR | Status: DC | PRN
  Administered 2013-07-25: 1 mg via INTRAVENOUS
  Administered 2013-07-25: 3 mg via INTRAVENOUS

## 2013-07-25 NOTE — Discharge Instructions (Signed)
Colonoscopy, Care After °Refer to this sheet in the next few weeks. These instructions provide you with information on caring for yourself after your procedure. Your health care provider may also give you more specific instructions. Your treatment has been planned according to current medical practices, but problems sometimes occur. Call your health care provider if you have any problems or questions after your procedure. °WHAT TO EXPECT AFTER THE PROCEDURE  °After your procedure, it is typical to have the following: °· A small amount of blood in your stool. °· Moderate amounts of gas and mild abdominal cramping or bloating. °HOME CARE INSTRUCTIONS °· Do not drive, operate machinery, or sign important documents for 24 hours. °· You may shower and resume your regular physical activities, but move at a slower pace for the first 24 hours. °· Take frequent rest periods for the first 24 hours. °· Walk around or put a warm pack on your abdomen to help reduce abdominal cramping and bloating. °· Drink enough fluids to keep your urine clear or pale yellow. °· You may resume your normal diet as instructed by your health care provider. Avoid heavy or fried foods that are hard to digest. °· Avoid drinking alcohol for 24 hours or as instructed by your health care provider. °· Only take over-the-counter or prescription medicines as directed by your health care provider. °· If a tissue sample (biopsy) was taken during your procedure: °· Do not take aspirin or blood thinners for 7 days, or as instructed by your health care provider. °· Do not drink alcohol for 7 days, or as instructed by your health care provider. °· Eat soft foods for the first 24 hours. °SEEK MEDICAL CARE IF: °You have persistent spotting of blood in your stool 2 3 days after the procedure. °SEEK IMMEDIATE MEDICAL CARE IF: °· You have more than a small spotting of blood in your stool. °· You pass large blood clots in your stool. °· Your abdomen is swollen  (distended). °· You have nausea or vomiting. °· You have a fever. °· You have increasing abdominal pain that is not relieved with medicine. °Document Released: 11/12/2003 Document Revised: 01/18/2013 Document Reviewed: 12/05/2012 °ExitCare® Patient Information ©2014 [employer]. ° °

## 2013-07-25 NOTE — Op Note (Signed)
[facility] [address] [place], [address]   COLONOSCOPY PROCEDURE REPORT  PATIENT: Kyle [name], Kyle [name]  MR#: [id number] BIRTHDATE: 12/20/1934 , 78  yrs. old GENDER: Male ENDOSCOPIST: Aviva Signs, MD REFERRED WU:XLKGMWN, William PROCEDURE DATE:  07/25/2013 PROCEDURE:   Colonoscopy, screening ASA CLASS:   Class III INDICATIONS:unexplained diarrhea. MEDICATIONS: Versed 4 mg IV and Demerol 50 mg IV  DESCRIPTION OF PROCEDURE:   After the risks benefits and alternatives of the procedure were thoroughly explained, informed consent was obtained.  A digital rectal exam revealed no abnormalities of the rectum.   The [id number] ([id number])  endoscope was introduced through the anus and advanced to the cecum, which was identified by both the appendix and ileocecal valve. No adverse events experienced.   The quality of the prep was adequate, using Trilyte  The instrument was then slowly withdrawn as the colon was fully examined.      COLON FINDINGS: Mild diverticulosis was noted in the descending colon.   The colon mucosa was otherwise normal.  Retroflexed views revealed no abnormalities. The time to cecum=6 minutes 0 seconds. Withdrawal time=2 minutes 0 seconds.  The scope was withdrawn and the procedure completed. COMPLICATIONS: There were no complications.  ENDOSCOPIC IMPRESSION: 1.   Mild diverticulosis was noted in the descending colon 2.   The colon mucosa was otherwise normal  RECOMMENDATIONS: Repeat Colonscopy in 10 years.   eSigned:  Aviva Signs, MD 07/25/2013 8:51 AM   cc:

## 2013-07-25 NOTE — Interval H&P Note (Signed)
History and Physical Interval Note:  07/25/2013 8:31 AM  Kyle [name].  has presented today for surgery, with the diagnosis of diarrhea  The various methods of treatment have been discussed with the patient and family. After consideration of risks, benefits and other options for treatment, the patient has consented to  Procedure(s): COLONOSCOPY (N/A) as a surgical intervention .  The patient's history has been reviewed, patient examined, no change in status, stable for surgery.  I have reviewed the patient's chart and labs.  Questions were answered to the patient's satisfaction.     Jamesetta So

## 2013-07-27 ENCOUNTER — Encounter (HOSPITAL_COMMUNITY): Payer: Self-pay | Admitting: General Surgery

## 2013-08-22 ENCOUNTER — Other Ambulatory Visit: Payer: Self-pay

## 2013-08-22 MED ORDER — PYRIDOSTIGMINE BROMIDE 60 MG PO TABS: 30.0000 mg | ORAL_TABLET | Freq: Three times a day (TID) | ORAL | Status: DC

## 2013-10-31 ENCOUNTER — Other Ambulatory Visit: Payer: Self-pay | Admitting: Neurology

## 2014-01-17 ENCOUNTER — Other Ambulatory Visit (HOSPITAL_COMMUNITY): Payer: Self-pay | Admitting: "Endocrinology

## 2014-01-17 DIAGNOSIS — E042 Nontoxic multinodular goiter: Secondary | ICD-10-CM

## 2014-01-25 ENCOUNTER — Ambulatory Visit (HOSPITAL_COMMUNITY)
Admission: RE | Admit: 2014-01-25 | Discharge: 2014-01-25 | Disposition: A | Discharge status: Home / Self Care | Payer: Medicare Other | Source: Ambulatory Visit | Attending: "Endocrinology | Admitting: "Endocrinology

## 2014-01-25 DIAGNOSIS — E042 Nontoxic multinodular goiter: Secondary | ICD-10-CM | POA: Diagnosis not present

## 2014-02-14 ENCOUNTER — Ambulatory Visit (INDEPENDENT_AMBULATORY_CARE_PROVIDER_SITE_OTHER): Payer: Medicare Other | Admitting: Neurology

## 2014-02-14 ENCOUNTER — Encounter: Payer: Self-pay | Admitting: Neurology

## 2014-02-14 VITALS — BP 129/67 | HR 50 | Ht 68.0 in | Wt 178.8 lb

## 2014-02-14 DIAGNOSIS — R569 Unspecified convulsions: Secondary | ICD-10-CM

## 2014-02-14 DIAGNOSIS — G7000 Myasthenia gravis without (acute) exacerbation: Secondary | ICD-10-CM

## 2014-02-14 DIAGNOSIS — Z5181 Encounter for therapeutic drug level monitoring: Secondary | ICD-10-CM

## 2014-02-14 MED ORDER — DILANTIN INFATABS 50 MG PO CHEW: 25.0000 mg | CHEWABLE_TABLET | Freq: Two times a day (BID) | ORAL | Status: DC

## 2014-02-14 MED ORDER — PYRIDOSTIGMINE BROMIDE 60 MG PO TABS
30.0000 mg | ORAL_TABLET | Freq: Three times a day (TID) | ORAL | Status: DC
Start: 2014-02-14 — End: 2015-07-17

## 2014-02-14 MED ORDER — ATENOLOL 50 MG PO TABS: 50.0000 mg | ORAL_TABLET | Freq: Every day | ORAL | Status: DC

## 2014-02-14 MED ORDER — DILANTIN 100 MG PO CAPS: ORAL_CAPSULE | ORAL | Status: DC

## 2014-02-14 NOTE — Progress Notes (Signed)
Reason for visit: myasthenia gravis  Kyle [name]. is a 79 y.o. male  History of present illness:  Kyle [name] is a 79 year old right-handed white male with a history of myasthenia gravis primarily with ocular features, and a history of seizures that have been under good control. The patient indicates that he has not had a seizure in over 10 years. The patient is on Dilantin, and he is tolerating the medication well. The patient takes low-dose Mestinon for his myasthenia gravis. He has some occasional episodes of double vision and ptosis, but this does not impair his ability to function, he is able to operate a motor vehicle without difficulty. The patient has some problems with nocturnal leg cramps that may affect the legs and the feet. The patient takes vinegar as a preventative, and episodes may occur on average once a week. He will take mustard when the cramps come on. The patient never went on magnesium supplementation. He returns to this office for an evaluation. He denies any significant medical issues that have come up since last seen. The patient may have some difficulty with getting up from a squatting position at times, but he denies any falls.  Past Medical History  Diagnosis Date  . Hypertension   . Auto immune neutropenia   . Cancer   . Arthritis   . Ocular myasthenia gravis   . Seizures   . Sleep paralysis   . Dyslipidemia   . History of prostate cancer   . Bullous pemphigoid   . Myasthenia 02/14/2013  . Nocturnal leg cramps   . Thyroid nodule, cold   . Diarrhea     Past Surgical History  Procedure Laterality Date  . Brain surgery    . Cholecystectomy    . Radioactive seed implant    . Colonoscopy N/A 07/25/2013    Procedure: COLONOSCOPY;  Surgeon: Jamesetta So, MD;  Location: [facility];  Service: Gastroenterology;  Laterality: N/A;    Family History  Problem Relation Age of Onset  . Heart failure Daughter   . Heart failure Mother   . Diabetes  Mother   . Diabetes Sister   . Lung cancer Son   . Colon cancer Neg Hx     Social history:  reports that he has never smoked. He has never used smokeless tobacco. He reports that he does not drink alcohol or use illicit drugs.    Allergies  Allergen Reactions  . Bactericin [Bacitracin]   . Iohexol      Code: HIVES, Desc: pt was premedicated/hives reaction occurred 24 hours post injection, Onset Date: [id number]   . Sulfa Antibiotics Swelling and Rash    Medications:  Current Outpatient Prescriptions on File Prior to Visit  Medication Sig Dispense Refill  . clobetasol (TEMOVATE) 0.05 % external solution Apply 1 application topically daily as needed (rash).     . diclofenac (VOLTAREN) 75 MG EC tablet Take 1 tablet by mouth two  times daily 180 tablet 0  . fish oil-omega-3 fatty acids 1000 MG capsule Take 1 g by mouth 3 (three) times daily.    . Garlic (GARLIQUE PO) Take 1 tablet by mouth daily.    . niacinamide 500 MG tablet Take 500 mg by mouth 3 (three) times daily.    . simvastatin (ZOCOR) 20 MG tablet Take 20 mg by mouth daily.    . tamsulosin (FLOMAX) 0.4 MG CAPS capsule Take 0.4 mg by mouth daily.     No current facility-administered  medications on file prior to visit.    ROS:  Out of a complete 14 system review of symptoms, the patient complains only of the following symptoms, and all other reviewed systems are negative.  Hearing loss Snoring Muscle cramps  Blood pressure 129/67, pulse 50, height 5\' 8"  (1.727 m), weight 178 lb 12.8 oz (81.103 kg).  Physical Exam  General: The patient is alert and cooperative at the time of the examination.  Skin: No significant peripheral edema is noted.   Neurologic Exam  Mental status: The patient is oriented x 3.  Cranial nerves: Facial symmetry is present. Speech is normal, no aphasia or dysarthria is noted. Extraocular movements are full. Visual fields are full. With superior gaze for 1 minute, there is no increased  ptosis, subjective double vision. Facial muscle strength is normal, strength with jaw opening and closure is normal.  Motor: The patient has good strength in all 4 extremities. With the arms outstretched for 1 minute, there is no fatigable weakness of the deltoid muscle seen.  Sensory examination: Soft touch sensation is symmetric on the face, arms, and legs.  Coordination: The patient has good finger-nose-finger and heel-to-shin bilaterally.  Gait and station: The patient has a normal gait. Tandem gait is minimally unsteady.. Romberg is negative. No drift is seen.  Reflexes: Deep tendon reflexes are symmetric.   Assessment/Plan:  1. Myasthenia gravis  2. History of seizures  The patient has done fairly well with the myasthenia gravis that mainly has ocular features. The patient has history of seizures that are under good control. His primary care physician has checked a CBC and liver profile in May 2015. The patient has not had a Dilantin level in several years, we will check this today. The patient may try magnesium for the nocturnal leg cramps, but if this worsens the myasthenic symptoms he will have to stop the medication. He will follow-up otherwise in 6 months. Prescriptions were given for his medications today. The atenolol dose was increased by his primary care physician in December 2014. His heart rate has dropped from 60 to 50, but the patient is asymptomatic with the medication.  Jill Alexanders MD 02/14/2014 8:33 AM  [facility] [address] [place], [place] [address]  Phone [phone] Fax [phone]

## 2014-02-14 NOTE — Patient Instructions (Signed)
Myasthenia Gravis Myasthenia gravis is a disease that causes muscle weakness throughout the body. The muscles affected are the ones we can control (voluntary muscles). An example of a voluntary muscle is your hand muscles. You can control the muscles to make the hand pick something up. An example of an involuntary muscle is the heart. The heart beats without any direction from you.  Myasthenia Gravis is thought to be an autoimmune disease. That means that normal defenses of the body begin to attack the body. In this case, the immune system begins to attack cells located at the junctions of the muscles and the nerves. Women are affected more often. Women are affected at a younger age than men. Babies born to affected women frequently develop symptoms at an early age. SYMPTOMS Initially in the disease, the facial muscles are affected first. After this, a person may develop droopy eyelids. They may have difficulty controlling facial muscles. They may have problems chewing. Swallowing and speaking may become impaired. The weakness gradually spreads to the arms and legs. It begins to affect breathing. Sometimes, the symptoms lessen or go away without any apparent cause. DIAGNOSIS  Diagnosis can be made with blood tests. Tests such as electromyography may be done to examine the electrical activity in the muscle. An improvement in symptoms after having an anti-cholinesterase drug helps confirm the diagnosis.  TREATMENT  Medicines are usually prescribed as the first treatment. These medicines help, but they do not cure the disease. A plasma cleansing procedure (plasmapheresis) can be used to treat a crisis. It can also be used to prepare a person for surgery. This procedure produces short-term improvement. Some cases are helped by removing the thymus gland. Steroids are used for short-term benefits. Document Released: 07/06/2000 Document Revised: 06/22/2011 Document Reviewed: 05/31/2013 Exit[employer] Patient  Information 2015 [employer]. This information is not intended to replace advice given to you by your health care provider. Make sure you discuss any questions you have with your health care provider.  

## 2014-02-15 LAB — PHENYTOIN LEVEL, TOTAL: Phenytoin Lvl: 17.4 ug/mL (ref 10.0–20.0)

## 2014-02-15 NOTE — Progress Notes (Signed)
Quick Note:  Spoke to patient and relayed good dilantin level, no change in dose, per Dr. Jannifer Franklin. ______

## 2014-02-19 ENCOUNTER — Other Ambulatory Visit: Payer: Self-pay | Admitting: Neurology

## 2014-02-28 ENCOUNTER — Encounter: Payer: Self-pay | Admitting: Neurology

## 2014-03-06 ENCOUNTER — Encounter: Payer: Self-pay | Admitting: Neurology

## 2014-05-28 ENCOUNTER — Other Ambulatory Visit (HOSPITAL_COMMUNITY): Payer: Self-pay | Admitting: "Endocrinology

## 2014-05-28 DIAGNOSIS — E049 Nontoxic goiter, unspecified: Secondary | ICD-10-CM

## 2014-07-26 ENCOUNTER — Ambulatory Visit (HOSPITAL_COMMUNITY)
Admission: RE | Admit: 2014-07-26 | Discharge: 2014-07-26 | Disposition: A | Discharge status: Home / Self Care | Payer: Medicare Other | Source: Ambulatory Visit | Attending: "Endocrinology | Admitting: "Endocrinology

## 2014-07-26 DIAGNOSIS — E049 Nontoxic goiter, unspecified: Secondary | ICD-10-CM | POA: Diagnosis not present

## 2014-08-15 ENCOUNTER — Ambulatory Visit (INDEPENDENT_AMBULATORY_CARE_PROVIDER_SITE_OTHER): Payer: Medicare Other | Admitting: Neurology

## 2014-08-15 ENCOUNTER — Encounter: Payer: Self-pay | Admitting: Neurology

## 2014-08-15 VITALS — BP 156/75 | HR 47 | Ht 68.0 in | Wt 182.0 lb

## 2014-08-15 DIAGNOSIS — R569 Unspecified convulsions: Secondary | ICD-10-CM

## 2014-08-15 DIAGNOSIS — G7000 Myasthenia gravis without (acute) exacerbation: Secondary | ICD-10-CM | POA: Diagnosis not present

## 2014-08-15 MED ORDER — AMLODIPINE BESYLATE 2.5 MG PO TABS: 2.5000 mg | ORAL_TABLET | Freq: Every day | ORAL | Status: DC

## 2014-08-15 MED ORDER — DICLOFENAC SODIUM 75 MG PO TBEC: DELAYED_RELEASE_TABLET | ORAL | Status: DC

## 2014-08-15 MED ORDER — ATENOLOL 25 MG PO TABS: 25.0000 mg | ORAL_TABLET | Freq: Every day | ORAL | Status: DC

## 2014-08-15 NOTE — Patient Instructions (Signed)
Myasthenia Gravis Myasthenia gravis is a disease that causes muscle weakness throughout the body. The muscles affected are the ones we can control (voluntary muscles). An example of a voluntary muscle is your hand muscles. You can control the muscles to make the hand pick something up. An example of an involuntary muscle is the heart. The heart beats without any direction from you.  Myasthenia Gravis is thought to be an autoimmune disease. That means that normal defenses of the body begin to attack the body. In this case, the immune system begins to attack cells located at the junctions of the muscles and the nerves. Women are affected more often. Women are affected at a younger age than men. Babies born to affected women frequently develop symptoms at an early age. SYMPTOMS Initially in the disease, the facial muscles are affected first. After this, a person may develop droopy eyelids. They may have difficulty controlling facial muscles. They may have problems chewing. Swallowing and speaking may become impaired. The weakness gradually spreads to the arms and legs. It begins to affect breathing. Sometimes, the symptoms lessen or go away without any apparent cause. DIAGNOSIS  Diagnosis can be made with blood tests. Tests such as electromyography may be done to examine the electrical activity in the muscle. An improvement in symptoms after having an anti-cholinesterase drug helps confirm the diagnosis.  TREATMENT  Medicines are usually prescribed as the first treatment. These medicines help, but they do not cure the disease. A plasma cleansing procedure (plasmapheresis) can be used to treat a crisis. It can also be used to prepare a person for surgery. This procedure produces short-term improvement. Some cases are helped by removing the thymus gland. Steroids are used for short-term benefits. Document Released: 07/06/2000 Document Revised: 06/22/2011 Document Reviewed: 05/31/2013 Exit[employer] Patient  Information 2015 [employer]. This information is not intended to replace advice given to you by your health care provider. Make sure you discuss any questions you have with your health care provider.  

## 2014-08-15 NOTE — Progress Notes (Signed)
Reason for visit: Myasthenia gravis  Kyle [name]. is an 80 y.o. male  History of present illness:  Kyle [name] is a 80 year old right-handed white male with a history of seizures that have been under good control. He has a history of myasthenia gravis, and he takes Mestinon for this with good benefit. The patient is on Dilantin for the seizures, he has not had any recurrence of seizures. He denies any weakness of the extremities, double vision, swallowing problems or chewing problems. He has had some problems with a slightly elevated blood pressure consistently, with heart rates in the 50s and upper 40s. He is on very low-dose atenolol, 25 mg daily. His current primary care physician is on maternity leave. He has had some problems with nocturnal leg cramps, but he has found a topical ointment that seems to be effective. He has had issues with the skin with bullous pemphigoid that has also affected his gums. He returns for an evaluation.  Past Medical History  Diagnosis Date  . Hypertension   . Auto immune neutropenia   . Cancer   . Arthritis   . Ocular myasthenia gravis   . Seizures   . Sleep paralysis   . Dyslipidemia   . History of prostate cancer   . Bullous pemphigoid   . Myasthenia 02/14/2013  . Nocturnal leg cramps   . Thyroid nodule, cold   . Diarrhea     Past Surgical History  Procedure Laterality Date  . Brain surgery    . Cholecystectomy    . Radioactive seed implant    . Colonoscopy N/A 07/25/2013    Procedure: COLONOSCOPY;  Surgeon: Jamesetta So, MD;  Location: [facility];  Service: Gastroenterology;  Laterality: N/A;    Family History  Problem Relation Age of Onset  . Heart failure Daughter   . Heart failure Mother   . Diabetes Mother   . Diabetes Sister   . Lung cancer Son   . Colon cancer Neg Hx     Social history:  reports that he has never smoked. He has never used smokeless tobacco. He reports that he does not drink alcohol or use illicit  drugs.    Allergies  Allergen Reactions  . Bactericin [Bacitracin]   . Iohexol      Code: HIVES, Desc: pt was premedicated/hives reaction occurred 24 hours post injection, Onset Date: [id number]   . Sulfa Antibiotics Swelling and Rash    Medications:  Prior to Admission medications   Medication Sig Start Date End Date Taking? Authorizing Provider  atenolol (TENORMIN) 25 MG tablet Take 25 mg by mouth daily.   Yes Historical Provider, MD  clobetasol (TEMOVATE) 0.05 % external solution Apply 1 application topically daily as needed (rash).  11/17/12  Yes Historical Provider, MD  diclofenac (VOLTAREN) 75 MG EC tablet Take 1 tablet by mouth two  times daily 02/19/14  Yes Kathrynn Ducking, MD  DILANTIN 100 MG ER capsule Take 2 capsules by mouth  two times daily 02/14/14  Yes Kathrynn Ducking, MD  DILANTIN INFATABS 50 MG tablet Chew 0.5 tablets (25 mg total) by mouth 2 (two) times daily. 02/14/14  Yes Kathrynn Ducking, MD  fish oil-omega-3 fatty acids 1000 MG capsule Take 1 g by mouth 3 (three) times daily.   Yes Historical Provider, MD  Garlic (GARLIQUE PO) Take 1 tablet by mouth daily.   Yes Historical Provider, MD  niacinamide 500 MG tablet Take 500 mg by mouth 3 (  three) times daily.   Yes Historical Provider, MD  pyridostigmine (MESTINON) 60 MG tablet Take 0.5 tablets (30 mg total) by mouth 3 (three) times daily. 02/14/14  Yes Kathrynn Ducking, MD  simvastatin (ZOCOR) 20 MG tablet Take 20 mg by mouth daily. 12/19/12  Yes Historical Provider, MD  tamsulosin (FLOMAX) 0.4 MG CAPS capsule Take 0.4 mg by mouth daily. 09/29/12  Yes Historical Provider, MD    ROS:  Out of a complete 14 system review of symptoms, the patient complains only of the following symptoms, and all other reviewed systems are negative.  Restless legs, snoring Joint pain, muscle cramps Bruising easily  Blood pressure 156/75, pulse 47, height 5\' 8"  (1.727 m), weight 182 lb (82.555 kg).  Physical Exam  General: The patient is  alert and cooperative at the time of the examination.  Skin: No significant peripheral edema is noted.   Neurologic Exam  Mental status: The patient is alert and oriented x 3 at the time of the examination. The patient has apparent normal recent and remote memory, with an apparently normal attention span and concentration ability.   Cranial nerves: Facial symmetry is present. Speech is normal, no aphasia or dysarthria is noted. Extraocular movements are full. Visual fields are full.  Motor: The patient has good strength in all 4 extremities.  Sensory examination: Soft touch sensation is symmetric on the face, arms, and legs.  Coordination: The patient has good finger-nose-finger and heel-to-shin bilaterally.  Gait and station: The patient has a normal gait. Tandem gait is normal. Romberg is negative. No drift is seen.  Reflexes: Deep tendon reflexes are symmetric.   Assessment/Plan:  1. Seizures, well controlled  2. Ocular myasthenia gravis  3. Nocturnal leg cramps  4. Hypertension  The patient is doing relatively well at this point. His blood pressures are slightly elevated, we will add low-dose Norvasc to the atenolol. The patient was given prescription for Dilantin and atenolol. He will follow-up in 9 months.  Jill Alexanders MD 08/15/2014 8:43 PM  [facility] [address] [place], [place] [address]  Phone [phone] Fax [phone]

## 2014-08-23 ENCOUNTER — Telehealth: Payer: Self-pay | Admitting: Neurology

## 2014-08-23 NOTE — Telephone Encounter (Signed)
I called back.  Spoke with Ms Pichardo.  She said they just wanted to clarify if patient is to take both Norvasc and Atenolol.   Per OV notes: The patient is doing relatively well at this point. His blood pressures are slightly elevated, we will add low-dose Norvasc to the atenolol They will proceed with meds as ordered and will call us back if anything further is needed.

## 2014-08-23 NOTE — Telephone Encounter (Signed)
Patient called and requested to speak with someone regarding some of the medication he is taking. Rx.atenolol (TENORMIN) 25 MG tablet and Rx.amLODipine (NORVASC) 2.5 MG tablet. Please call and advise.

## 2014-10-17 ENCOUNTER — Telehealth: Payer: Self-pay | Admitting: Neurology

## 2014-10-17 MED ORDER — AMLODIPINE BESYLATE 2.5 MG PO TABS: 2.5000 mg | ORAL_TABLET | Freq: Every day | ORAL | Status: DC

## 2014-10-17 NOTE — Telephone Encounter (Signed)
Patient is calling to let the doctor how his new Rx amLODipine 2.5 is doing for him.  He states BP average is 154/73 and heartrate 55. Patient has only 2 pills left and is going out of town tomorrow. If he is to continue Rx will need to be called in to [facility].  Please call

## 2014-10-17 NOTE — Telephone Encounter (Signed)
I called the patient. I will call in a Rx for the patient.

## 2014-10-25 ENCOUNTER — Observation Stay (HOSPITAL_COMMUNITY)
Admission: EM | Admit: 2014-10-25 | Discharge: 2014-10-26 | Disposition: A | Discharge status: Home / Self Care | Payer: Medicare Other | Attending: Internal Medicine | Admitting: Internal Medicine

## 2014-10-25 ENCOUNTER — Observation Stay (HOSPITAL_BASED_OUTPATIENT_CLINIC_OR_DEPARTMENT_OTHER): Payer: Medicare Other

## 2014-10-25 ENCOUNTER — Emergency Department (HOSPITAL_COMMUNITY): Payer: Medicare Other

## 2014-10-25 ENCOUNTER — Encounter (HOSPITAL_COMMUNITY): Payer: Self-pay

## 2014-10-25 DIAGNOSIS — E785 Hyperlipidemia, unspecified: Secondary | ICD-10-CM | POA: Diagnosis not present

## 2014-10-25 DIAGNOSIS — R0602 Shortness of breath: Secondary | ICD-10-CM | POA: Diagnosis present

## 2014-10-25 DIAGNOSIS — Z79899 Other long term (current) drug therapy: Secondary | ICD-10-CM | POA: Diagnosis not present

## 2014-10-25 DIAGNOSIS — G4762 Sleep related leg cramps: Secondary | ICD-10-CM | POA: Insufficient documentation

## 2014-10-25 DIAGNOSIS — M1990 Unspecified osteoarthritis, unspecified site: Secondary | ICD-10-CM | POA: Diagnosis not present

## 2014-10-25 DIAGNOSIS — I10 Essential (primary) hypertension: Secondary | ICD-10-CM | POA: Diagnosis not present

## 2014-10-25 DIAGNOSIS — R079 Chest pain, unspecified: Principal | ICD-10-CM | POA: Insufficient documentation

## 2014-10-25 DIAGNOSIS — Z86718 Personal history of other venous thrombosis and embolism: Secondary | ICD-10-CM | POA: Insufficient documentation

## 2014-10-25 DIAGNOSIS — G478 Other sleep disorders: Secondary | ICD-10-CM | POA: Diagnosis not present

## 2014-10-25 DIAGNOSIS — E041 Nontoxic single thyroid nodule: Secondary | ICD-10-CM | POA: Insufficient documentation

## 2014-10-25 DIAGNOSIS — Z8546 Personal history of malignant neoplasm of prostate: Secondary | ICD-10-CM | POA: Insufficient documentation

## 2014-10-25 DIAGNOSIS — R740 Nonspecific elevation of levels of transaminase and lactic acid dehydrogenase [LDH]: Secondary | ICD-10-CM

## 2014-10-25 DIAGNOSIS — Z862 Personal history of diseases of the blood and blood-forming organs and certain disorders involving the immune mechanism: Secondary | ICD-10-CM | POA: Diagnosis not present

## 2014-10-25 DIAGNOSIS — G7000 Myasthenia gravis without (acute) exacerbation: Secondary | ICD-10-CM | POA: Diagnosis present

## 2014-10-25 DIAGNOSIS — Z791 Long term (current) use of non-steroidal anti-inflammatories (NSAID): Secondary | ICD-10-CM | POA: Diagnosis not present

## 2014-10-25 DIAGNOSIS — R0600 Dyspnea, unspecified: Secondary | ICD-10-CM | POA: Diagnosis not present

## 2014-10-25 DIAGNOSIS — D696 Thrombocytopenia, unspecified: Secondary | ICD-10-CM | POA: Diagnosis present

## 2014-10-25 DIAGNOSIS — R7401 Elevation of levels of liver transaminase levels: Secondary | ICD-10-CM | POA: Diagnosis present

## 2014-10-25 HISTORY — DX: Acute embolism and thrombosis of unspecified deep veins of unspecified lower extremity: I82.409

## 2014-10-25 HISTORY — DX: Bradycardia, unspecified: R00.1

## 2014-10-25 HISTORY — DX: Benign neoplasm of meninges, unspecified: D32.9

## 2014-10-25 LAB — CBC WITH DIFFERENTIAL/PLATELET
BASOS ABS: 0 10*3/uL (ref 0.0–0.1)
BASOS PCT: 0 % (ref 0–1)
EOS ABS: 0.3 10*3/uL (ref 0.0–0.7)
Eosinophils Relative: 4 % (ref 0–5)
HEMATOCRIT: 43.7 % (ref 39.0–52.0)
Hemoglobin: 15.2 g/dL (ref 13.0–17.0)
LYMPHS ABS: 2.1 10*3/uL (ref 0.7–4.0)
Lymphocytes Relative: 24 % (ref 12–46)
MCH: 33 pg (ref 26.0–34.0)
MCHC: 34.8 g/dL (ref 30.0–36.0)
MCV: 94.8 fL (ref 78.0–100.0)
MONO ABS: 0.8 10*3/uL (ref 0.1–1.0)
Monocytes Relative: 9 % (ref 3–12)
NEUTROS PCT: 63 % (ref 43–77)
Neutro Abs: 5.4 10*3/uL (ref 1.7–7.7)
Platelets: 130 10*3/uL — ABNORMAL LOW (ref 150–400)
RBC: 4.61 MIL/uL (ref 4.22–5.81)
RDW: 13 % (ref 11.5–15.5)
WBC: 8.6 10*3/uL (ref 4.0–10.5)

## 2014-10-25 LAB — TSH: TSH: 3.316 u[IU]/mL (ref 0.350–4.500)

## 2014-10-25 LAB — BASIC METABOLIC PANEL
Anion gap: 10 (ref 5–15)
BUN: 23 mg/dL — AB (ref 6–20)
CHLORIDE: 104 mmol/L (ref 101–111)
CO2: 24 mmol/L (ref 22–32)
Calcium: 8.5 mg/dL — ABNORMAL LOW (ref 8.9–10.3)
Creatinine, Ser: 0.68 mg/dL (ref 0.61–1.24)
GFR calc Af Amer: >60 mL/min (ref >60)
GFR calc non Af Amer: >60 mL/min (ref >60)
Glucose, Bld: 121 mg/dL — ABNORMAL HIGH (ref 65–99)
Potassium: 4.1 mmol/L (ref 3.5–5.1)
SODIUM: 138 mmol/L (ref 135–145)

## 2014-10-25 LAB — TROPONIN I
Troponin I: <0.03 ng/mL (ref <0.031)
Troponin I: <0.03 ng/mL (ref <0.031)
Troponin I: <0.03 ng/mL (ref <0.031)

## 2014-10-25 LAB — PHENYTOIN LEVEL, TOTAL: Phenytoin Lvl: 14.9 ug/mL (ref 10.0–20.0)

## 2014-10-25 LAB — BRAIN NATRIURETIC PEPTIDE: B NATRIURETIC PEPTIDE 5: 90 pg/mL (ref 0.0–100.0)

## 2014-10-25 MED ORDER — DICLOFENAC SODIUM 75 MG PO TBEC
75.0000 mg | DELAYED_RELEASE_TABLET | Freq: Two times a day (BID) | ORAL | Status: DC
  Administered 2014-10-25 – 2014-10-26 (×4): 75 mg via ORAL
  Filled 2014-10-25 (×9): qty 1

## 2014-10-25 MED ORDER — ASPIRIN 81 MG PO CHEW
324.0000 mg | CHEWABLE_TABLET | Freq: Once | ORAL | Status: AC
  Administered 2014-10-25: 324 mg via ORAL
  Filled 2014-10-25: qty 4

## 2014-10-25 MED ORDER — NITROGLYCERIN 0.4 MG SL SUBL
0.4000 mg | SUBLINGUAL_TABLET | SUBLINGUAL | Status: AC | PRN
  Administered 2014-10-25 (×3): 0.4 mg via SUBLINGUAL
  Filled 2014-10-25: qty 1

## 2014-10-25 MED ORDER — ATENOLOL 25 MG PO TABS
25.0000 mg | ORAL_TABLET | Freq: Every day | ORAL | Status: DC
  Administered 2014-10-25 – 2014-10-26 (×2): 25 mg via ORAL
  Filled 2014-10-25 (×2): qty 1

## 2014-10-25 MED ORDER — MORPHINE SULFATE 2 MG/ML IJ SOLN
2.0000 mg | Freq: Once | INTRAMUSCULAR | Status: AC
  Administered 2014-10-25: 2 mg via INTRAVENOUS
  Filled 2014-10-25: qty 1

## 2014-10-25 MED ORDER — ACETAMINOPHEN 325 MG PO TABS: 650.0000 mg | ORAL_TABLET | Freq: Four times a day (QID) | ORAL | Status: DC | PRN

## 2014-10-25 MED ORDER — PANTOPRAZOLE SODIUM 40 MG PO TBEC
40.0000 mg | DELAYED_RELEASE_TABLET | Freq: Every day | ORAL | Status: DC
Start: 2014-10-25 — End: 2014-10-26
  Administered 2014-10-25 – 2014-10-26 (×2): 40 mg via ORAL
  Filled 2014-10-25 (×2): qty 1

## 2014-10-25 MED ORDER — OMEGA-3-ACID ETHYL ESTERS 1 G PO CAPS
1.0000 g | ORAL_CAPSULE | Freq: Three times a day (TID) | ORAL | Status: DC
  Administered 2014-10-25 – 2014-10-26 (×5): 1 g via ORAL
  Filled 2014-10-25 (×5): qty 1

## 2014-10-25 MED ORDER — OMEGA-3 FATTY ACIDS 1000 MG PO CAPS: 1.0000 g | ORAL_CAPSULE | Freq: Three times a day (TID) | ORAL | Status: DC

## 2014-10-25 MED ORDER — SODIUM CHLORIDE 0.9 % IV SOLN
INTRAVENOUS | Status: AC
  Administered 2014-10-25: 09:00:00 via INTRAVENOUS

## 2014-10-25 MED ORDER — PYRIDOSTIGMINE BROMIDE 60 MG PO TABS
30.0000 mg | ORAL_TABLET | Freq: Three times a day (TID) | ORAL | Status: DC
  Administered 2014-10-25 – 2014-10-26 (×5): 30 mg via ORAL
  Filled 2014-10-25 (×13): qty 0.5

## 2014-10-25 MED ORDER — SODIUM CHLORIDE 0.9 % IJ SOLN
3.0000 mL | Freq: Two times a day (BID) | INTRAMUSCULAR | Status: DC
  Administered 2014-10-26: 3 mL via INTRAVENOUS

## 2014-10-25 MED ORDER — ONDANSETRON HCL 4 MG/2ML IJ SOLN
4.0000 mg | Freq: Once | INTRAMUSCULAR | Status: AC
  Administered 2014-10-25: 4 mg via INTRAVENOUS
  Filled 2014-10-25: qty 2

## 2014-10-25 MED ORDER — ENOXAPARIN SODIUM 40 MG/0.4ML ~~LOC~~ SOLN
40.0000 mg | SUBCUTANEOUS | Status: DC
Start: 2014-10-25 — End: 2014-10-26
  Administered 2014-10-25 – 2014-10-26 (×2): 40 mg via SUBCUTANEOUS
  Filled 2014-10-25 (×2): qty 0.4

## 2014-10-25 MED ORDER — AMLODIPINE BESYLATE 5 MG PO TABS
2.5000 mg | ORAL_TABLET | Freq: Every day | ORAL | Status: DC
  Administered 2014-10-25 – 2014-10-26 (×2): 2.5 mg via ORAL
  Filled 2014-10-25 (×2): qty 1

## 2014-10-25 MED ORDER — TAMSULOSIN HCL 0.4 MG PO CAPS
0.4000 mg | ORAL_CAPSULE | Freq: Every day | ORAL | Status: DC
  Administered 2014-10-25 – 2014-10-26 (×2): 0.4 mg via ORAL
  Filled 2014-10-25 (×2): qty 1

## 2014-10-25 MED ORDER — ACETAMINOPHEN 650 MG RE SUPP: 650.0000 mg | Freq: Four times a day (QID) | RECTAL | Status: DC | PRN

## 2014-10-25 MED ORDER — PHENYTOIN 50 MG PO CHEW
25.0000 mg | CHEWABLE_TABLET | Freq: Two times a day (BID) | ORAL | Status: DC
  Administered 2014-10-25 – 2014-10-26 (×3): 25 mg via ORAL
  Filled 2014-10-25 (×3): qty 1

## 2014-10-25 MED ORDER — ASPIRIN EC 81 MG PO TBEC
81.0000 mg | DELAYED_RELEASE_TABLET | Freq: Every day | ORAL | Status: DC
  Administered 2014-10-25 – 2014-10-26 (×2): 81 mg via ORAL
  Filled 2014-10-25 (×2): qty 1

## 2014-10-25 MED ORDER — SIMVASTATIN 20 MG PO TABS
20.0000 mg | ORAL_TABLET | Freq: Every evening | ORAL | Status: DC
Start: 2014-10-25 — End: 2014-10-26
  Administered 2014-10-25: 20 mg via ORAL
  Filled 2014-10-25: qty 1

## 2014-10-25 MED ORDER — PHENYTOIN SODIUM EXTENDED 100 MG PO CAPS
200.0000 mg | ORAL_CAPSULE | Freq: Two times a day (BID) | ORAL | Status: DC
  Administered 2014-10-25 – 2014-10-26 (×3): 200 mg via ORAL
  Filled 2014-10-25 (×3): qty 2

## 2014-10-25 NOTE — ED Notes (Signed)
   10/25/14 0429  Respiratory  Respiratory (WDL) X  Bilateral Breath Sounds Clear  Respiratory Pattern Regular  Chest Assessment Chest expansion symmetrical  O2 Device Room Air  Pt c/o waking up with chest tightness and sob. Pt states the tightness is better now.

## 2014-10-25 NOTE — Progress Notes (Signed)
[facility] PROGRESS NOTE  Kyle [name]. GYK:[id number] DOB: [birth year] DOA: 10/25/2014 PCP: Rocky Morel, MD  Assessment/Plan: Chest pan: atypical. Resolved today. Risk factors  HTN, dylipidemia.  Await lipid panel. Troponin negative x4. Chest xray no active cardiopulmonary disease. EKG without acute changes. Echo with EF 79-39% grade 1 diastolic dysfunction. Evaluated by cardiology who opine lexiscan in am.    Thrombocytopenia. No s/sx bleeding. monitor  Hyperglycemia: CBG 121. await hga1c. No hx DM  Myasthenia: await neurology consult Cont current tx  Code Status: full Family Communication:  Disposition Plan: home hopefully tomorrw   Consultants:  cardiology  Procedures:  ECHO: The cavity size was normal. Wall thickness was increased in a pattern of moderate LVH. Systolic function was vigorous. The estimated ejection fraction was in the range of 65% to 70%. Wall motion was normal; there were no regional wall motion abnormalities. Doppler parameters are consistent with abnormal left ventricular relaxation (grade 1 diastolic dysfunction).  Antibiotics:    HPI/Subjective: Sitting on side of bed. Denies pain/discomfort  Objective: Filed Vitals:   10/25/14 1336  BP: 132/67  Pulse: 55  Temp: 97.8 F (36.6 C)  Resp: 20    Intake/Output Summary (Last 24 hours) at 10/25/14 1514 Last data filed at 10/25/14 1216  Gross per 24 hour  Intake    240 ml  Output      0 ml  Net    240 ml   Filed Weights   10/25/14 0428 10/25/14 0609  Weight: 77.111 kg (170 lb) 80.65 kg (177 lb 12.8 oz)    Exam:   General:  Well nourished   Cardiovascular: RRR no MGR   Respiratory: normal effort BS clear bilateral  Abdomen: soft +BS non-tender  Musculoskeletal: no swelling/erythema   Data Reviewed: Basic Metabolic Panel:  Recent Labs Lab 10/25/14 0457  NA 138  K 4.1  CL 104  CO2 24  GLUCOSE 121*  BUN 23*  CREATININE 0.68  CALCIUM  8.5*   Liver Function Tests: No results for input(s): AST, ALT, ALKPHOS, BILITOT, PROT, ALBUMIN in the last 168 hours. No results for input(s): LIPASE, AMYLASE in the last 168 hours. No results for input(s): AMMONIA in the last 168 hours. CBC:  Recent Labs Lab 10/25/14 0457  WBC 8.6  NEUTROABS 5.4  HGB 15.2  HCT 43.7  MCV 94.8  PLT 130*   Cardiac Enzymes:  Recent Labs Lab 10/25/14 0457 10/25/14 0810 10/25/14 1342  TROPONINI <0.03 <0.03 <0.03   BNP (last 3 results)  Recent Labs  10/25/14 0457  BNP 90.0    ProBNP (last 3 results) No results for input(s): PROBNP in the last 8760 hours.  CBG: No results for input(s): GLUCAP in the last 168 hours.  No results found for this or any previous visit (from the past 240 hour(s)).   Studies: Dg Chest 2 View  10/25/2014   CLINICAL DATA:  Patient woke up this morning with chest tightness.  EXAM: CHEST  2 VIEW  COMPARISON:  05/17/2008  FINDINGS: Hyperinflation. The heart size and mediastinal contours are within normal limits. Both lungs are clear. The visualized skeletal structures are unremarkable.  IMPRESSION: No active cardiopulmonary disease.   Electronically Signed   By: Lucienne Capers M.D.   On: 10/25/2014 05:25    Scheduled Meds: . amLODipine  2.5 mg Oral Daily  . aspirin EC  81 mg Oral Daily  . atenolol  25 mg Oral Daily  . diclofenac  75 mg Oral BID WC  .  enoxaparin (LOVENOX) injection  40 mg Subcutaneous Q24H  . omega-3 acid ethyl esters  1 g Oral TID  . pantoprazole  40 mg Oral Q0600  . phenytoin  25 mg Oral BID  . phenytoin  200 mg Oral BID  . pyridostigmine  30 mg Oral TID  . simvastatin  20 mg Oral QPM  . sodium chloride  3 mL Intravenous Q12H  . tamsulosin  0.4 mg Oral Daily   Continuous Infusions: . sodium chloride 75 mL/hr at 10/25/14 0849    Active Problems:   Myasthenia   Chest pain   Dyspnea    Time spent: 35 minutes    [facility] Pager [phone]. If 7PM-7AM,  please contact night-coverage at [email or web address], password [facility] 10/25/2014, 3:14 PM

## 2014-10-25 NOTE — Progress Notes (Signed)
Unable to locate the NIF and FVC measurement devices. MD notified. MD ordered me to do Incentive Spirometry to determine Vital Capacity.   Ideal volume goal : 2800 ml  Patient achieved : 2800 ml  Patient had no difficulty performing test. No adverse reactions or shortness of breath noted at this time.

## 2014-10-25 NOTE — Progress Notes (Signed)
2D Echo showed mod LVH, EF 65-70%, normal wall motion, grade 1 DD, severe LAE, mildly dilated RV with normal thickness and systolic function, mod increased PASP, dilated IVC with normal resp variation. D/w Dr. Harl Bowie. No signs of PE on exam and he reports echo does not really show right heart strain. Elevated pulm pressures are felt likely due to chronic diastolic dysfunction. This admission he's had no evidence of acute CHF by exam and BNP was normal. Will keep NPO after midnight and proceed with lexiscan nuc in AM. Patient thinks he could try walking on a treadmill but in the past has been nervous to due to h/o seizures - to be safe, we'll do this pharmacologically. Keller Bounds PA-C

## 2014-10-25 NOTE — Consult Note (Signed)
Cardiology Consultation Note  Patient ID: Kyle Picotte., MRN: [id number], DOB/AGE: May 18, 1934 80 y.o. Admit date: 10/25/2014   Date of Consult: 10/25/2014 Primary Physician: Kyle Morel, MD Primary Cardiologist: New to Dr. Harl [name]  Chief Complaint: chest pain, SOB Reason for Consultation: CP/SOB  HPI: [name] is a 80 y/o active man with history of HTN, dyslipidemia, myasthenia gravis, seizures, bullous pemphigoid,, sleep paralysis, remote RLE DVT (after meningioma resection in 1993) who presented to [facility] overnight with chest tightness and SOB. He is a nonsmoker with a family history of CHF but no known CAD. He awoke around 3am and felt "stuck" like he was awake but paralyzed and couldn't wake himself up. He felt a sensation of SOB with this - sounding like sleep paralysis. He was finally able to jolt himself awake, but noticed he still felt SOB but also with chest tightness. He got up and walked around and took deep breaths, but pain did not improve nor worsen. Due to persistent symptoms he presented to the ER. He was given morphine and NTG without relief. He says the symptoms finally eased off on their own after about 4 hours of constant discomfort. Since that time he's had 2 recurrent episodes of chest tightness radiating to his upper abdomen, lasting about 3-4 minutes and resolving spontaneously. In the ER he is not tachycardic, tacyhpneic or hypoxic. He is currently symptom free. He and his family describe him as very active - always on the go, especially with yardwork. He worked outside yesterday doing mowing and weed-eating and had no functional limitation, CP or dyspnea. He has had no exertional symptoms recently otherwise. CXR no active. BNP normal. Troponin neg x 2. No palpitations, orthopnea, syncope, LEE, weight gain, orthopnea, recent travel/surgery/bedrest. Tele shows NSR/SB (HR upper 40s nocturnally but mostly 50s).  Past Medical History  Diagnosis Date  . Hypertension    . Auto immune neutropenia   . Arthritis   . Ocular myasthenia gravis   . Seizures   . Sleep paralysis   . Dyslipidemia   . History of prostate cancer   . Bullous pemphigoid   . Myasthenia 02/14/2013  . Nocturnal leg cramps   . Thyroid nodule, cold   . Sinus bradycardia   . Meningioma     a. s/p resection 1990s.      Most Recent Cardiac Studies: None available   Surgical History:  Past Surgical History  Procedure Laterality Date  . Brain surgery    . Cholecystectomy    . Radioactive seed implant    . Colonoscopy N/A 07/25/2013    Procedure: COLONOSCOPY;  Surgeon: Kyle So, MD;  Location: [facility];  Service: Gastroenterology;  Laterality: N/A;     Home Meds: Prior to Admission medications   Medication Sig Start Date End Date Taking? Authorizing Provider  amLODipine (NORVASC) 2.5 MG tablet Take 1 tablet (2.5 mg total) by mouth daily. 10/17/14  Yes Kyle Ducking, MD  atenolol (TENORMIN) 25 MG tablet Take 1 tablet (25 mg total) by mouth daily. 08/15/14  Yes Kyle Ducking, MD  clobetasol (TEMOVATE) 0.05 % external solution Apply 1 application topically daily as needed (rash).  11/17/12  Yes Historical Provider, MD  diclofenac (VOLTAREN) 75 MG EC tablet Take 1 tablet by mouth two  times daily 08/15/14  Yes Kyle Ducking, MD  DILANTIN 100 MG ER capsule Take 2 capsules by mouth  two times daily 02/14/14  Yes Kyle Ducking, MD  DILANTIN INFATABS 42  MG tablet Chew 0.5 tablets (25 mg total) by mouth 2 (two) times daily. 02/14/14  Yes Kyle Ducking, MD  fish oil-omega-3 fatty acids 1000 MG capsule Take 1 g by mouth 3 (three) times daily.   Yes Historical Provider, MD  Garlic (GARLIQUE PO) Take 1 tablet by mouth daily.   Yes Historical Provider, MD  niacinamide 500 MG tablet Take 500 mg by mouth 3 (three) times daily.   Yes Historical Provider, MD  pyridostigmine (MESTINON) 60 MG tablet Take 0.5 tablets (30 mg total) by mouth 3 (three) times daily. 02/14/14  Yes Kyle Ducking, MD  simvastatin (ZOCOR) 20 MG tablet Take 20 mg by mouth daily. 12/19/12  Yes Historical Provider, MD  tamsulosin (FLOMAX) 0.4 MG CAPS capsule Take 0.4 mg by mouth daily. 09/29/12  Yes Historical Provider, MD    Inpatient Medications:  . amLODipine  2.5 mg Oral Daily  . atenolol  25 mg Oral Daily  . diclofenac  75 mg Oral BID WC  . enoxaparin (LOVENOX) injection  40 mg Subcutaneous Q24H  . omega-3 acid ethyl esters  1 g Oral TID  . phenytoin  25 mg Oral BID  . phenytoin  100 mg Oral Daily  . pyridostigmine  30 mg Oral TID  . simvastatin  20 mg Oral QPM  . sodium chloride  3 mL Intravenous Q12H  . tamsulosin  0.4 mg Oral Daily   . sodium chloride 75 mL/hr at 10/25/14 0849    Allergies:  Allergies  Allergen Reactions  . Bactericin [Bacitracin]   . Iohexol      Code: HIVES, Desc: pt was premedicated/hives reaction occurred 24 hours post injection, Onset Date: [id number]   . Sulfa Antibiotics Swelling and Rash    History   Social History  . Marital Status: Married    Spouse Name: N/A  . Number of Children: 1  . Years of Education: 37 Kyle [name]   Occupational History  . retired    Social History Main Topics  . Smoking status: Never Smoker   . Smokeless tobacco: Never Used  . Alcohol Use: No  . Drug Use: No  . Sexual Activity: Not on file   Other Topics Concern  . Not on file   Social History Narrative   Patient is right handed.   Patient drinks 1 cup of coffee daily.     Family History  Problem Relation Age of Onset  . Heart failure Daughter   . Heart failure Mother   . Diabetes Mother   . Diabetes Sister   . Lung cancer Son   . Colon cancer Neg Hx      Review of Systems: All other systems reviewed and are otherwise negative except as noted above.  Labs:  Recent Labs  10/25/14 0457 10/25/14 0810  TROPONINI <0.03 <0.03   Lab Results  Component Value Date   WBC 8.6 10/25/2014   HGB 15.2 10/25/2014   HCT 43.7 10/25/2014   MCV 94.8 10/25/2014    PLT 130* 10/25/2014     Recent Labs Lab 10/25/14 0457  NA 138  K 4.1  CL 104  CO2 24  BUN 23*  CREATININE 0.68  CALCIUM 8.5*  GLUCOSE 121*   Radiology/Studies:  Dg Chest 2 View  10/25/2014   CLINICAL DATA:  Patient woke up this morning with chest tightness.  EXAM: CHEST  2 VIEW  COMPARISON:  05/17/2008  FINDINGS: Hyperinflation. The heart size and mediastinal contours are within normal limits. Both lungs are  clear. The visualized skeletal structures are unremarkable.  IMPRESSION: No active cardiopulmonary disease.   Electronically Signed   By: Lucienne Capers M.D.   On: 10/25/2014 05:25    Wt Readings from Last 3 Encounters:  10/25/14 177 lb 12.8 oz (80.65 kg)  08/15/14 182 lb (82.555 kg)  02/14/14 178 lb 12.8 oz (81.103 kg)    EKG: sinus bradycardia 53bpm, nonspecific T wave changes, no prior to compare to  Physical Exam: Blood pressure 128/62, pulse 50, temperature 98.5 F (36.9 C), temperature source Oral, resp. rate 21, height 5\' 8"  (1.727 m), weight 177 lb 12.8 oz (80.65 kg), SpO2 97 %. General: Well developed, well nourished, in no acute distress. Head: Normocephalic, atraumatic, sclera non-icteric, no xanthomas, nares are without discharge.  Neck: Negative for carotid bruits. JVD not elevated. Lungs: Clear bilaterally to auscultation without wheezes, rales, or rhonchi. Breathing is unlabored. Heart: RRR with S1 S2. No murmurs, rubs, or gallops appreciated. Abdomen: Soft, non-tender, non-distended with normoactive bowel sounds. No hepatomegaly. No rebound/guarding. No obvious abdominal masses. Msk:  Strength and tone appear normal for age. Extremities: No clubbing or cyanosis. No edema.  Distal pedal pulses are 2+ and equal bilaterally. Neuro: Alert and oriented X 3. No facial asymmetry. No focal deficit. Moves all extremities spontaneously. Psych:  Responds to questions appropriately with a normal affect.    Assessment and Plan:   1. Chest discomfort/shortness of  breath - symptoms are somewhat atypical, came on after what sounded like an episode of sleep paralysis. Despite 4 hours of constant discomfort, troponins are negative thus far. No recent exertional symptoms. He's had 2 brief recurrences of discomfort associated with epigastric pain. Question GI related. He does take daily NSAIDS. Will start PPI. Await echo. Will discuss plan for nuc with MD - inpatient vs outpatient.  2. HTN - controlled.  3. Hyperlipidemia - check lipids in AM.  4. Myasthenia gravis - he does not seem to let this slow him down. On home regimen.  5. Sinus bradycardia - HR appears at baseline per review of chart. Check TSH.  SignedMelina Copa PA-C 10/25/2014, 9:07 AM Pager: [phone]   Patient seen and discussed with PA Dunn, I agree with her documentation. 80 yo male history of myasthenia gravis, HL, HTN admitted with chest pain. Admitted early this AM with fairly atypical chest pain and is still undergoing rule out for ACS. 4 hours of constant midchest pain with some SOB. [name] far cardiac enzymes are negative, EKG without specific ischemic changes. Echo is pending. Sinus brady, decreased atenolol to 25 mg daily. May discontinue if low heart rates continue, several other options for bp control if needed. Pending complete of ACS rule out and echo results, consider if stress testing is needed and whether inpatient vs outpatient is best.   BNP 90, K 4.1, Cr 0.68, Hgb 15.2, Plt 130, trop neg x 2.  CXR no acute process EKG sinus brady, nonspecific ST/T changes   Zandra Abts MD

## 2014-10-25 NOTE — H&P (Signed)
Kyle Cosens. is an 80 y.o. male.     Chief Complaint: chest tightness HPI: 80yo male with htn, hyperlipidemia, myasthenia gravis, with c/o dyspnea, and chest tightness starting about 3 am.  Pt states that the last round of morphine has helped take the pain away.  Pt denies fever, chills, cough, gerd, n/v, diarrhea, brbpr, black stool, weakness beyond baseline. Pt was brought to ED for evaluation and trop negative.    Past Medical History  Diagnosis Date  . Hypertension   . Auto immune neutropenia   . Cancer   . Arthritis   . Ocular myasthenia gravis   . Seizures   . Sleep paralysis   . Dyslipidemia   . History of prostate cancer   . Bullous pemphigoid   . Myasthenia 02/14/2013  . Nocturnal leg cramps   . Thyroid nodule, cold   . Diarrhea     Past Surgical History  Procedure Laterality Date  . Brain surgery    . Cholecystectomy    . Radioactive seed implant    . Colonoscopy N/A 07/25/2013    Procedure: COLONOSCOPY;  Surgeon: Jamesetta So, MD;  Location: [facility];  Service: Gastroenterology;  Laterality: N/A;    Family History  Problem Relation Age of Onset  . Heart failure Daughter   . Heart failure Mother   . Diabetes Mother   . Diabetes Sister   . Lung cancer Son   . Colon cancer Neg Hx    Social History:  reports that he has never smoked. He has never used smokeless tobacco. He reports that he does not drink alcohol or use illicit drugs.  Allergies:  Allergies  Allergen Reactions  . Bactericin [Bacitracin]   . Iohexol      Code: HIVES, Desc: pt was premedicated/hives reaction occurred 24 hours post injection, Onset Date: 66063016   . Sulfa Antibiotics Swelling and Rash     (Not in a hospital admission)  Results for orders placed or performed during the hospital encounter of 10/25/14 (from the past 48 hour(s))  CBC with Differential     Status: Abnormal   Collection Time: 10/25/14  4:57 AM  Result Value Ref Range   WBC 8.6 4.0 - 10.5 K/uL   RBC 4.61 4.22 - 5.81 MIL/uL   Hemoglobin 15.2 13.0 - 17.0 g/dL   HCT 43.7 39.0 - 52.0 %   MCV 94.8 78.0 - 100.0 fL   MCH 33.0 26.0 - 34.0 pg   MCHC 34.8 30.0 - 36.0 g/dL   RDW 13.0 11.5 - 15.5 %   Platelets 130 (L) 150 - 400 K/uL   Neutrophils Relative % 63 43 - 77 %   Neutro Abs 5.4 1.7 - 7.7 K/uL   Lymphocytes Relative 24 12 - 46 %   Lymphs Abs 2.1 0.7 - 4.0 K/uL   Monocytes Relative 9 3 - 12 %   Monocytes Absolute 0.8 0.1 - 1.0 K/uL   Eosinophils Relative 4 0 - 5 %   Eosinophils Absolute 0.3 0.0 - 0.7 K/uL   Basophils Relative 0 0 - 1 %   Basophils Absolute 0.0 0.0 - 0.1 K/uL  Basic metabolic panel     Status: Abnormal   Collection Time: 10/25/14  4:57 AM  Result Value Ref Range   Sodium 138 135 - 145 mmol/L   Potassium 4.1 3.5 - 5.1 mmol/L   Chloride 104 101 - 111 mmol/L   CO2 24 22 - 32 mmol/L   Glucose, Bld 121 (  H) 65 - 99 mg/dL   BUN 23 (H) 6 - 20 mg/dL   Creatinine, Ser 0.68 0.61 - 1.24 mg/dL   Calcium 8.5 (L) 8.9 - 10.3 mg/dL   GFR calc non Af Amer >60 >60 mL/min   GFR calc Af Amer >60 >60 mL/min    Comment: (NOTE) The eGFR has been calculated using the CKD EPI equation. This calculation has not been validated in all clinical situations. eGFR's persistently <60 mL/min signify possible Chronic Kidney Disease.    Anion gap 10 5 - 15  Troponin I     Status: None   Collection Time: 10/25/14  4:57 AM  Result Value Ref Range   Troponin I <0.03 <0.031 ng/mL    Comment:        NO INDICATION OF MYOCARDIAL INJURY.   Brain natriuretic peptide     Status: None   Collection Time: 10/25/14  4:57 AM  Result Value Ref Range   B Natriuretic Peptide 90.0 0.0 - 100.0 pg/mL  Phenytoin level, total     Status: None   Collection Time: 10/25/14  4:57 AM  Result Value Ref Range   Phenytoin Lvl 14.9 10.0 - 20.0 ug/mL   Dg Chest 2 View  10/25/2014   CLINICAL DATA:  Patient woke up this morning with chest tightness.  EXAM: CHEST  2 VIEW  COMPARISON:  05/17/2008  FINDINGS:  Hyperinflation. The heart size and mediastinal contours are within normal limits. Both lungs are clear. The visualized skeletal structures are unremarkable.  IMPRESSION: No active cardiopulmonary disease.   Electronically Signed   By: Lucienne Capers M.D.   On: 10/25/2014 05:25    Review of Systems  Constitutional: Negative.   HENT: Negative.   Eyes: Negative.   Respiratory: Positive for shortness of breath. Negative for cough, hemoptysis, sputum production and wheezing.   Cardiovascular: Positive for chest pain. Negative for palpitations, orthopnea, claudication, leg swelling and PND.  Gastrointestinal: Negative.   Genitourinary: Negative.   Musculoskeletal: Negative.   Skin: Negative.   Neurological: Negative.   Endo/Heme/Allergies: Negative.   Psychiatric/Behavioral: Negative.     Blood pressure 110/71, pulse 52, temperature 97.1 F (36.2 C), resp. rate 12, height 5' 8" (1.727 m), weight 77.111 kg (170 lb), SpO2 95 %. Physical Exam  Constitutional: He is oriented to person, place, and time. He appears well-developed and well-nourished.  HENT:  Head: Normocephalic and atraumatic.  Eyes: Conjunctivae and EOM are normal. Pupils are equal, round, and reactive to light. No scleral icterus.  Neck: Normal range of motion. Neck supple. No JVD present. No tracheal deviation present. No thyromegaly present.  Cardiovascular: Normal rate and regular rhythm.  Exam reveals no gallop and no friction rub.   No murmur heard. Respiratory: Effort normal and breath sounds normal. No respiratory distress. He has no wheezes. He has no rales.  GI: Soft. Bowel sounds are normal. He exhibits no distension. There is no tenderness. There is no rebound and no guarding.  Musculoskeletal: Normal range of motion. He exhibits no edema or tenderness.  Lymphadenopathy:    He has no cervical adenopathy.  Neurological: He is alert and oriented to person, place, and time. He has normal reflexes. He displays normal  reflexes. No cranial nerve deficit. He exhibits normal muscle tone. Coordination normal.  Skin: Skin is warm and dry. No rash noted. No erythema. No pallor.  Psychiatric: He has a normal mood and affect. His behavior is normal. Judgment and thought content normal.     Assessment/Plan  Dyspnea Chest pan Tele Trop i q6hx3  Check cardiac echo NPO  Check nuclear stress test    Thrombocytopenia,  Check cbc in am  Hyperglycemia Check hga1c  Myasthenia Cont current tx  DVT prophylaxis:  SCD  Jani Gravel 10/25/2014, 6:14 AM

## 2014-10-25 NOTE — Consult Note (Addendum)
[place]ak[place] A. Merlene Laughter, MD     [email or web address]          Kyle [name]. is an 80 y.o. male.   ASSESSMENT/PLAN: 1. Myasthenia gravis mostly ocular type without evidence of exacerbation. 2. Mild gait impairment of unclear etiology likely multifactorial including aging and arthritic changes. No evidence of generalized myasthenic syndrome affecting his symptom of gait impairment. 3. Likely restless leg syndrome. 4. Nocturnal leg pain syndrome. RECOMMENDATION: Labs for restless leg syndrome including ferritin, vitamin B12 and homocysteine level. Continue with pyridostigmine to the current doses.  The patient presents with chest pain and dyspnea on awakening this morning symptoms involving entire chest region and was severe with the patient's symptoms persisted and on the morning time. This resulted in the patient seek medical attention. He does have a history of myasthenia gravis ocular type for the last several years. He initially presented with right ptosis and diplopia. He was treated by Dr. Floyde Parkins in [place] with pyridostigmine after an initial workup revealed the likely diagnosis of myself and is. He has done well with this. He still has mild ptosis but is able to function well. Dr. Tobey Grim notes are reviewed from his last visits and copied in the records. The patient has a longer history of right-sided focal seizures which was worked up to be due to intracranial tumor. The patient underwent craniotomy for the removal of the tumor and resolution of the seizures. He has been seizure-free for at least 10 years. However, he tells me that his right leg has been weak ever since he had his seizures and surgery years ago. Patient reports that he has been having some difficulties with walking around over last year. He feels that his legs are just not as strong as they used to be. He complains of having some nocturnal leg pain associated with motor restlessness.  Additionally, he has pain awakening in the morning time about halt the time. The symptoms have been present for the last several months. The review of systems otherwise unremarkable.  GENERAL: This is a very pleasant man in no acute distress.  HEENT: Supple. Atraumatic normocephalic. There is mild right ptosis.  ABDOMEN: soft  EXTREMITIES: No edema   BACK: Normal.  SKIN: Normal by inspection.    MENTAL STATUS: Alert and oriented. Speech, language and cognition are generally intact. Judgment and insight normal.   CRANIAL NERVES: Pupils are equal, round and reactive to light and accommodation; extra ocular movements are full, there is no significant nystagmus; visual fields are full; upper and lower facial muscles are normal in strength and symmetric, there is no flattening of the nasolabial folds; tongue is midline; uvula is midline; shoulder elevation is normal.  MOTOR: Normal tone, bulk and strength; no pronator drift. Arm abduction time is normal with the patient being able to go 2 minutes.  COORDINATION: Left finger to nose is normal, right finger to nose is normal, No rest tremor; no intention tremor; no postural tremor; no bradykinesia.  REFLEXES: Deep tendon reflexes are symmetrical and normal. Babinski reflexes are flexor bilaterally.   SENSATION: Normal to light touch.  GAIT: Seems relatively normal and steady today.    [[[[[[[[[[[[[[[[[[[[[[[[[[[[facility] NOTE 02/2014. Mr. Vilar is a 80 year old right-handed white male with a history of myasthenia gravis primarily with ocular features, and a history of seizures that have been under good control. The patient indicates that he has not had a seizure in over 10 years. The patient is on Dilantin, and he is  tolerating the medication well. The patient takes low-dose Mestinon for his myasthenia gravis. He has some occasional episodes of double vision and ptosis, but this does not impair his ability to function, he is able to operate a  motor vehicle without difficulty. The patient has some problems with nocturnal leg cramps that may affect the legs and the feet. The patient takes vinegar as a preventative, and episodes may occur on average once a week. He will take mustard when the cramps come on. The patient never went on magnesium supplementation. He returns to this office for an evaluation. He denies any significant medical issues that have come up since last seen. The patient may have some difficulty with getting up from a squatting position at times, but he denies any falls.]]]]]]]]]]]]]]]]]]]]]]]]]]]]]]  Blood pressure 132/67, pulse 55, temperature 97.8 F (36.6 C), temperature source Oral, resp. rate 20, height _0  (1.727 m), weight 80.65 kg (177 lb 12.8 oz), SpO2 95 %.  Past Medical History  Diagnosis Date  . Hypertension   . Auto immune neutropenia   . Arthritis   . Ocular myasthenia gravis   . Seizures   . Sleep paralysis   . Dyslipidemia   . History of prostate cancer   . Bullous pemphigoid   . Myasthenia 02/14/2013  . Nocturnal leg cramps   . Thyroid nodule, cold   . Sinus bradycardia   . Meningioma     a. s/p resection 1990s.  Marland Kitchen DVT (deep venous thrombosis)     a. Has had a previous right lower extremity DVT in the setting of hospitalization and surgery (after his brain tumor was resected in 1993) but is no longer on Coumadin.    Past Surgical History  Procedure Laterality Date  . Brain surgery    . Cholecystectomy    . Radioactive seed implant    . Colonoscopy N/A 07/25/2013    Procedure: COLONOSCOPY;  Surgeon: Jamesetta So, MD;  Location: [facility];  Service: Gastroenterology;  Laterality: N/A;    Family History  Problem Relation Age of Onset  . Heart failure Daughter   . Heart failure Mother   . Diabetes Mother   . Diabetes Sister   . Lung cancer Son   . Colon cancer Neg Hx     Social History:  reports that he has never smoked. He has never used smokeless tobacco. He reports that  he does not drink alcohol or use illicit drugs.  Allergies:  Allergies  Allergen Reactions  . Bactericin [Bacitracin]   . Iohexol      Code: HIVES, Desc: pt was premedicated/hives reaction occurred 24 hours post injection, Onset Date: 96295284   . Sulfa Antibiotics Swelling and Rash    Medications: Prior to Admission medications   Medication Sig Start Date End Date Taking? Authorizing Provider  amLODipine (NORVASC) 2.5 MG tablet Take 1 tablet (2.5 mg total) by mouth daily. 10/17/14  Yes Kathrynn Ducking, MD  atenolol (TENORMIN) 50 MG tablet Take 50 mg by mouth daily.   Yes Historical Provider, MD  clobetasol (TEMOVATE) 0.05 % external solution Apply 1 application topically daily as needed (rash).  11/17/12  Yes Historical Provider, MD  diclofenac (VOLTAREN) 75 MG EC tablet Take 1 tablet by mouth two  times daily 08/15/14  Yes Kathrynn Ducking, MD  DILANTIN 100 MG ER capsule Take 2 capsules by mouth  two times daily 02/14/14  Yes Kathrynn Ducking, MD  DILANTIN INFATABS 50 MG tablet Chew 0.5 tablets (25  mg total) by mouth 2 (two) times daily. 02/14/14  Yes Kathrynn Ducking, MD  fish oil-omega-3 fatty acids 1000 MG capsule Take 1 g by mouth 3 (three) times daily.   Yes Historical Provider, MD  Garlic (GARLIQUE PO) Take 1 tablet by mouth daily.   Yes Historical Provider, MD  niacinamide 500 MG tablet Take 500 mg by mouth 3 (three) times daily.   Yes Historical Provider, MD  pyridostigmine (MESTINON) 60 MG tablet Take 0.5 tablets (30 mg total) by mouth 3 (three) times daily. 02/14/14  Yes Kathrynn Ducking, MD  simvastatin (ZOCOR) 20 MG tablet Take 20 mg by mouth daily. 12/19/12  Yes Historical Provider, MD  tamsulosin (FLOMAX) 0.4 MG CAPS capsule Take 0.4 mg by mouth daily. 09/29/12  Yes Historical Provider, MD    Scheduled Meds: . amLODipine  2.5 mg Oral Daily  . aspirin EC  81 mg Oral Daily  . atenolol  25 mg Oral Daily  . diclofenac  75 mg Oral BID WC  . enoxaparin (LOVENOX) injection  40 mg  Subcutaneous Q24H  . omega-3 acid ethyl esters  1 g Oral TID  . pantoprazole  40 mg Oral Q0600  . phenytoin  25 mg Oral BID  . phenytoin  200 mg Oral BID  . pyridostigmine  30 mg Oral TID  . simvastatin  20 mg Oral QPM  . sodium chloride  3 mL Intravenous Q12H  . tamsulosin  0.4 mg Oral Daily   Continuous Infusions: . sodium chloride 75 mL/hr at 10/25/14 0849   PRN Meds:.acetaminophen **OR** acetaminophen     Results for orders placed or performed during the hospital encounter of 10/25/14 (from the past 48 hour(s))  CBC with Differential     Status: Abnormal   Collection Time: 10/25/14  4:57 AM  Result Value Ref Range   WBC 8.6 4.0 - 10.5 K/uL   RBC 4.61 4.22 - 5.81 MIL/uL   Hemoglobin 15.2 13.0 - 17.0 g/dL   HCT 43.7 39.0 - 52.0 %   MCV 94.8 78.0 - 100.0 fL   MCH 33.0 26.0 - 34.0 pg   MCHC 34.8 30.0 - 36.0 g/dL   RDW 13.0 11.5 - 15.5 %   Platelets 130 (L) 150 - 400 K/uL   Neutrophils Relative % 63 43 - 77 %   Neutro Abs 5.4 1.7 - 7.7 K/uL   Lymphocytes Relative 24 12 - 46 %   Lymphs Abs 2.1 0.7 - 4.0 K/uL   Monocytes Relative 9 3 - 12 %   Monocytes Absolute 0.8 0.1 - 1.0 K/uL   Eosinophils Relative 4 0 - 5 %   Eosinophils Absolute 0.3 0.0 - 0.7 K/uL   Basophils Relative 0 0 - 1 %   Basophils Absolute 0.0 0.0 - 0.1 K/uL  Basic metabolic panel     Status: Abnormal   Collection Time: 10/25/14  4:57 AM  Result Value Ref Range   Sodium 138 135 - 145 mmol/L   Potassium 4.1 3.5 - 5.1 mmol/L   Chloride 104 101 - 111 mmol/L   CO2 24 22 - 32 mmol/L   Glucose, Bld 121 (H) 65 - 99 mg/dL   BUN 23 (H) 6 - 20 mg/dL   Creatinine, Ser 0.68 0.61 - 1.24 mg/dL   Calcium 8.5 (L) 8.9 - 10.3 mg/dL   GFR calc non Af Amer >60 >60 mL/min   GFR calc Af Amer >60 >60 mL/min    Comment: (NOTE) The eGFR has been calculated  using the CKD EPI equation. This calculation has not been validated in all clinical situations. eGFR's persistently <60 mL/min signify possible Chronic  Kidney Disease.    Anion gap 10 5 - 15  Troponin I     Status: None   Collection Time: 10/25/14  4:57 AM  Result Value Ref Range   Troponin I <0.03 <0.031 ng/mL    Comment:        NO INDICATION OF MYOCARDIAL INJURY.   Brain natriuretic peptide     Status: None   Collection Time: 10/25/14  4:57 AM  Result Value Ref Range   B Natriuretic Peptide 90.0 0.0 - 100.0 pg/mL  Phenytoin level, total     Status: None   Collection Time: 10/25/14  4:57 AM  Result Value Ref Range   Phenytoin Lvl 14.9 10.0 - 20.0 ug/mL  TSH     Status: None   Collection Time: 10/25/14  4:57 AM  Result Value Ref Range   TSH 3.316 0.350 - 4.500 uIU/mL  Troponin I (q 6hr x 3)     Status: None   Collection Time: 10/25/14  8:10 AM  Result Value Ref Range   Troponin I <0.03 <0.031 ng/mL    Comment:        NO INDICATION OF MYOCARDIAL INJURY.   Troponin I (q 6hr x 3)     Status: None   Collection Time: 10/25/14  1:42 PM  Result Value Ref Range   Troponin I <0.03 <0.031 ng/mL    Comment:        NO INDICATION OF MYOCARDIAL INJURY.     Studies/Results:     Kenzee Bassin A. Merlene Laughter, M.D.  Diplomate, Tax adviser of Psychiatry and Neurology ( Neurology). 10/25/2014, 5:55 PM

## 2014-10-25 NOTE — ED Notes (Signed)
Pt states he awoke feeling like he couldn't get his breath, states his chest feels tight.

## 2014-10-25 NOTE — Care Management Note (Signed)
Case Management Note  Patient Details  Name: Kyle [name]. MRN: [id number] Date of Birth: 1935/01/12  Subjective/Objective:                    Action/Plan:   Expected Discharge Date:                  Expected Discharge Plan:  Home/Self Care  In-House Referral:  NA  Discharge planning Services  CM Consult  Post Acute Care Choice:  NA Choice offered to:  NA  DME Arranged:    DME Agency:     HH Arranged:    HH Agency:     Status of Service:  Completed, signed off  Medicare Important Message Given:    Date Medicare IM Given:    Medicare IM give by:    Date Additional Medicare IM Given:    Additional Medicare Important Message give by:     If discussed at [place] of Stay Meetings, dates discussed:    Additional Comments: Pts Medicare OBS Notification form signed and placed on shadow chart. Christinia Gully Decherd, RN 10/25/2014, 11:27 AM

## 2014-10-25 NOTE — ED Provider Notes (Signed)
TIME SEEN: 4:30 AM  CHIEF COMPLAINT: Chest tightness and shortness of breath  HPI: Pt is a 80 y.o. male with history of hypertension, dyslipidemia, myasthenia gravis currently on pyridostigmine him a previous history of brain tumor in 1993 status post resection with subsequent right-sided seizures on Dilantin who presents to the emergency department with shortness of breath that woke him up from sleep and chest tightness. Denies any aggravating or relieving factors. Reports his shortness of breath has improved but he still feels tight in his chest. No radiation of pain. Denies missing any doses of his pyridostigmine. No diplopia, extremity weakness, difficulty swallowing or speaking.  Reports history of stress test many years ago. No history of cardiac catheterization. No known history of CAD, CHF. Has had a previous right lower extremity DVT in the setting of hospitalization and surgery (after his brain tumor was resected in 1993) but is no longer on Coumadin. No history of PE. No lower extremity swelling. Reports he does wake up in the mornings with bilateral lower extremity pain for the past 4 months that goes away with walking. No fevers but has had dry cough. No history of tobacco use. No history of asthma or COPD. Does not wear oxygen at home.  Denies nausea or vomiting, dizziness. Is mildly diaphoretic in the emergency department.   ROS: See HPI Constitutional: no fever  Eyes: no drainage  ENT: no runny nose   Cardiovascular:  chest pain  Resp: SOB  GI: no vomiting GU: no dysuria Integumentary: no rash  Allergy: no hives  Musculoskeletal: no leg swelling  Neurological: no slurred speech ROS otherwise negative  PAST MEDICAL HISTORY/PAST SURGICAL HISTORY:  Past Medical History  Diagnosis Date  . Hypertension   . Auto immune neutropenia   . Cancer   . Arthritis   . Ocular myasthenia gravis   . Seizures   . Sleep paralysis   . Dyslipidemia   . History of prostate cancer   .  Bullous pemphigoid   . Myasthenia 02/14/2013  . Nocturnal leg cramps   . Thyroid nodule, cold   . Diarrhea     MEDICATIONS:  Prior to Admission medications   Medication Sig Start Date End Date Taking? Authorizing Provider  amLODipine (NORVASC) 2.5 MG tablet Take 1 tablet (2.5 mg total) by mouth daily. 10/17/14   Kathrynn Ducking, MD  atenolol (TENORMIN) 25 MG tablet Take 1 tablet (25 mg total) by mouth daily. 08/15/14   Kathrynn Ducking, MD  clobetasol (TEMOVATE) 0.05 % external solution Apply 1 application topically daily as needed (rash).  11/17/12   Historical Provider, MD  diclofenac (VOLTAREN) 75 MG EC tablet Take 1 tablet by mouth two  times daily 08/15/14   Kathrynn Ducking, MD  DILANTIN 100 MG ER capsule Take 2 capsules by mouth  two times daily 02/14/14   Kathrynn Ducking, MD  DILANTIN INFATABS 50 MG tablet Chew 0.5 tablets (25 mg total) by mouth 2 (two) times daily. 02/14/14   Kathrynn Ducking, MD  fish oil-omega-3 fatty acids 1000 MG capsule Take 1 g by mouth 3 (three) times daily.    Historical Provider, MD  Garlic (GARLIQUE PO) Take 1 tablet by mouth daily.    Historical Provider, MD  niacinamide 500 MG tablet Take 500 mg by mouth 3 (three) times daily.    Historical Provider, MD  pyridostigmine (MESTINON) 60 MG tablet Take 0.5 tablets (30 mg total) by mouth 3 (three) times daily. 02/14/14   Kathrynn Ducking,  MD  simvastatin (ZOCOR) 20 MG tablet Take 20 mg by mouth daily. 12/19/12   Historical Provider, MD  tamsulosin (FLOMAX) 0.4 MG CAPS capsule Take 0.4 mg by mouth daily. 09/29/12   Historical Provider, MD    ALLERGIES:  Allergies  Allergen Reactions  . Bactericin [Bacitracin]   . Iohexol      Code: HIVES, Desc: pt was premedicated/hives reaction occurred 24 hours post injection, Onset Date: [id number]   . Sulfa Antibiotics Swelling and Rash    SOCIAL HISTORY:  History  Substance Use Topics  . Smoking status: Never Smoker   . Smokeless tobacco: Never Used  . Alcohol Use: No     FAMILY HISTORY: Family History  Problem Relation Age of Onset  . Heart failure Daughter   . Heart failure Mother   . Diabetes Mother   . Diabetes Sister   . Lung cancer Son   . Colon cancer Neg Hx     EXAM: BP 180/82 mmHg  Pulse 57  Temp(Src) 97.1 F (36.2 C)  Resp 16  Ht 5\' 8"  (1.727 m)  Wt 170 lb (77.111 kg)  BMI 25.85 kg/m2  SpO2 96% CONSTITUTIONAL: Alert and oriented and responds appropriately to questions. Elderly, in no apparent distress HEAD: Normocephalic EYES: Conjunctivae clear, PERRL, extraocular movements intact ENT: normal nose; no rhinorrhea; moist mucous membranes; pharynx without lesions noted NECK: Supple, no meningismus, no LAD  CARD: Regular and bradycardic; S1 and S2 appreciated; no murmurs, no clicks, no rubs, no gallops RESP: Normal chest excursion without splinting or tachypnea; breath sounds clear and equal bilaterally; no wheezes, no rhonchi, no rales, no hypoxia or respiratory distress, speaking full sentences ABD/GI: Normal bowel sounds; non-distended; soft, non-tender, no rebound, no guarding, no peritoneal signs BACK:  The back appears normal and is non-tender to palpation, there is no CVA tenderness EXT: Normal ROM in all joints; non-tender to palpation; no edema; normal capillary refill; no cyanosis, no calf tenderness or swelling    SKIN: Normal color for age and race; warm, skin is clammy and slightly moist NEURO: Moves all extremities equally, sensation to light touch intact diffusely, cranial nerves II through XII intact PSYCH: The patient's mood and manner are appropriate. Grooming and personal hygiene are appropriate.  MEDICAL DECISION MAKING: Patient here with chest tightness and shortness of breath. Multiple risk factors for ACS. EKG shows no ischemic changes. He has also had a previous DVT in the setting of surgery and hospitalization. Denies any recent prolonged immobilization, surgery, fracture, trauma. He is not tachycardic,  tachypneic or hypoxic. Lower suspicion that this is caused by his myasthenia gravis given he has no other symptoms and appears neurologically intact. We'll obtain a vital capacity and negative inspiratory force. We'll give aspirin nitroglycerin. We'll obtain cardiac labs, chest x-ray. Anticipate admission for chest pain rule out.  ED PROGRESS: Unable to find equipment to perform negative inspiratory force but patient was able to achieve 2800 mL using incentive spirometry.  Doubt that this is due to muscle weakness from myasthenia gravis. His labs are unremarkable including negative troponin, normal BNP. Chest x-ray clear. No significant improvement in chest tightness with nitroglycerin. We'll give morphine Will admit for chest pain rule out given his age and multiple risk factors. His heart score is 4.  Discussed with Dr. Maudie Mercury with hospitalist service who agrees on admission to observation, telemetry.     EKG Interpretation  Date/Time:  Thursday October 25 2014 04:27:44 EDT Ventricular Rate:  53 PR Interval:  188 QRS Duration:  90 QT Interval:  450 QTC Calculation: 422 R Axis:   38 Text Interpretation:  Sinus bradycardia Nonspecific T abnrm, anterolateral leads No significant change since last tracing in 2009 Confirmed by Laderrick Wilk,  DO, Brantley Wiley [phone]) on 10/25/2014 4:31:10 AM         [place], DO 10/25/14 9476

## 2014-10-25 NOTE — Care Management Note (Signed)
Case Management Note  Patient Details  Name: Kyle [name]. MRN: [id number] Date of Birth: [birth year]  Subjective/Objective:                  Pt admitted from home with CP. Pt lives with his wife and will return home at discharge. Pt is independent with ADL's.  Action/Plan: No Cm needs noted.  Expected Discharge Date:   10/26/14               Expected Discharge Plan:  Home/Self Care  In-House Referral:  NA  Discharge planning Services  CM Consult  Post Acute Care Choice:  NA Choice offered to:  NA  DME Arranged:    DME Agency:     HH Arranged:    HH Agency:     Status of Service:  Completed, signed off  Medicare Important Message Given:    Date Medicare IM Given:    Medicare IM give by:    Date Additional Medicare IM Given:    Additional Medicare Important Message give by:     If discussed at [place] of Stay Meetings, dates discussed:    Additional Comments:  Joylene Draft, RN 10/25/2014, 11:25 AM

## 2014-10-26 ENCOUNTER — Observation Stay (HOSPITAL_COMMUNITY): Payer: Medicare Other

## 2014-10-26 ENCOUNTER — Encounter (HOSPITAL_COMMUNITY): Payer: Self-pay

## 2014-10-26 DIAGNOSIS — R740 Nonspecific elevation of levels of transaminase and lactic acid dehydrogenase [LDH]: Secondary | ICD-10-CM | POA: Diagnosis not present

## 2014-10-26 DIAGNOSIS — D696 Thrombocytopenia, unspecified: Secondary | ICD-10-CM | POA: Diagnosis not present

## 2014-10-26 DIAGNOSIS — R7401 Elevation of levels of liver transaminase levels: Secondary | ICD-10-CM | POA: Diagnosis present

## 2014-10-26 DIAGNOSIS — G7000 Myasthenia gravis without (acute) exacerbation: Secondary | ICD-10-CM | POA: Diagnosis not present

## 2014-10-26 DIAGNOSIS — R0600 Dyspnea, unspecified: Secondary | ICD-10-CM | POA: Diagnosis not present

## 2014-10-26 DIAGNOSIS — R079 Chest pain, unspecified: Secondary | ICD-10-CM | POA: Diagnosis not present

## 2014-10-26 LAB — CBC WITH DIFFERENTIAL/PLATELET
BASOS ABS: 0 10*3/uL (ref 0.0–0.1)
Basophils Relative: 0 % (ref 0–1)
Eosinophils Absolute: 0.1 10*3/uL (ref 0.0–0.7)
Eosinophils Relative: 2 % (ref 0–5)
HCT: 46.3 % (ref 39.0–52.0)
Hemoglobin: 15.9 g/dL (ref 13.0–17.0)
LYMPHS ABS: 0.9 10*3/uL (ref 0.7–4.0)
LYMPHS PCT: 13 % (ref 12–46)
MCH: 32.9 pg (ref 26.0–34.0)
MCHC: 34.3 g/dL (ref 30.0–36.0)
MCV: 95.9 fL (ref 78.0–100.0)
MONO ABS: 0.4 10*3/uL (ref 0.1–1.0)
MONOS PCT: 6 % (ref 3–12)
NEUTROS PCT: 79 % — AB (ref 43–77)
Neutro Abs: 5.6 10*3/uL (ref 1.7–7.7)
PLATELETS: 120 10*3/uL — AB (ref 150–400)
RBC: 4.83 MIL/uL (ref 4.22–5.81)
RDW: 13.1 % (ref 11.5–15.5)
WBC: 7.1 10*3/uL (ref 4.0–10.5)

## 2014-10-26 LAB — COMPREHENSIVE METABOLIC PANEL
ALT: 349 U/L — ABNORMAL HIGH (ref 17–63)
ANION GAP: 7 (ref 5–15)
AST: 207 U/L — ABNORMAL HIGH (ref 15–41)
Albumin: 4 g/dL (ref 3.5–5.0)
Alkaline Phosphatase: 83 U/L (ref 38–126)
BUN: 17 mg/dL (ref 6–20)
CO2: 27 mmol/L (ref 22–32)
Calcium: 8.6 mg/dL — ABNORMAL LOW (ref 8.9–10.3)
Chloride: 103 mmol/L (ref 101–111)
Creatinine, Ser: 0.6 mg/dL — ABNORMAL LOW (ref 0.61–1.24)
GFR calc Af Amer: >60 mL/min (ref >60)
GFR calc non Af Amer: >60 mL/min (ref >60)
GLUCOSE: 177 mg/dL — AB (ref 65–99)
POTASSIUM: 4.6 mmol/L (ref 3.5–5.1)
Sodium: 137 mmol/L (ref 135–145)
Total Bilirubin: 1 mg/dL (ref 0.3–1.2)
Total Protein: 7.4 g/dL (ref 6.5–8.1)

## 2014-10-26 LAB — LIPID PANEL
Cholesterol: 182 mg/dL (ref 0–200)
HDL: 67 mg/dL (ref >40)
LDL CALC: 89 mg/dL (ref 0–99)
Total CHOL/HDL Ratio: 2.7 RATIO
Triglycerides: 130 mg/dL (ref <150)
VLDL: 26 mg/dL (ref 0–40)

## 2014-10-26 LAB — NM MYOCAR MULTI W/SPECT W/WALL MOTION / EF
CHL CUP NUCLEAR SRS: 0
CHL CUP NUCLEAR SSS: 1
LVDIAVOL: 88 mL
LVSYSVOL: 35 mL
NUC STRESS TID: 1.04
Peak HR: 70 {beats}/min
RATE: 0.41
Rest HR: 47 {beats}/min
SDS: 1

## 2014-10-26 LAB — VITAMIN B12: Vitamin B-12: 327 pg/mL (ref 180–914)

## 2014-10-26 LAB — FERRITIN: Ferritin: 191 ng/mL (ref 24–336)

## 2014-10-26 MED ORDER — PANTOPRAZOLE SODIUM 40 MG PO TBEC: 40.0000 mg | DELAYED_RELEASE_TABLET | Freq: Every day | ORAL | Status: DC

## 2014-10-26 MED ORDER — AMLODIPINE BESYLATE 5 MG PO TABS: 10.0000 mg | ORAL_TABLET | Freq: Every day | ORAL | Status: DC

## 2014-10-26 MED ORDER — REGADENOSON 0.4 MG/5ML IV SOLN
INTRAVENOUS | Status: AC
  Administered 2014-10-26: 0.4 mg
  Filled 2014-10-26: qty 5

## 2014-10-26 MED ORDER — ATENOLOL 25 MG PO TABS: 25.0000 mg | ORAL_TABLET | Freq: Every day | ORAL | Status: DC

## 2014-10-26 MED ORDER — TECHNETIUM TC 99M SESTAMIBI - CARDIOLITE
10.0000 | Freq: Once | INTRAVENOUS | Status: AC | PRN
  Administered 2014-10-26: 8 via INTRAVENOUS

## 2014-10-26 MED ORDER — TECHNETIUM TC 99M SESTAMIBI GENERIC - CARDIOLITE
30.0000 | Freq: Once | INTRAVENOUS | Status: AC | PRN
  Administered 2014-10-26: 29 via INTRAVENOUS

## 2014-10-26 MED ORDER — AMLODIPINE BESYLATE 10 MG PO TABS: 10.0000 mg | ORAL_TABLET | Freq: Every day | ORAL | Status: DC

## 2014-10-26 MED ORDER — OMEGA-3-ACID ETHYL ESTERS 1 G PO CAPS: 1.0000 g | ORAL_CAPSULE | Freq: Three times a day (TID) | ORAL | Status: DC

## 2014-10-26 NOTE — Discharge Summary (Signed)
Physician Discharge Summary  [name]. JKD:[id number] DOB: 1935/04/08 DOA: 10/25/2014  PCP: Rocky Morel, MD  Admit date: 10/25/2014 Discharge date: 10/26/2014  Time spent: 40 minutes  Recommendations for Outpatient Follow-up:  1. Has follow up appointment with PCP 2 weeks. Recommend recheck liver function tests and OP abdominal US. Statin discontinued. Monitor BP and HR as BB discontinued and norvasc increased at discharge due to bradycardia 2. Follow up with Dr Jannifer Franklin as soon as possible for evaluation of liver function tests and Dilantin use.   Discharge Diagnoses:  Active Problems:   Myasthenia   Chest pain   Dyspnea   Thrombocytopenia   Elevated transaminase level   Discharge Condition: stable  Diet recommendation: heart healthy  Filed Weights   10/25/14 0428 10/25/14 0609 10/26/14 0616  Weight: 77.111 kg (170 lb) 80.65 kg (177 lb 12.8 oz) 80.196 kg (176 lb 12.8 oz)    History of present illness:  80yo male with htn, hyperlipidemia, myasthenia gravis presented to ED on 10/25/14, with c/o dyspnea, and chest tightness starting about 3 am. Pt stated that the last round of morphine had helped take the pain away. Pt denied fever, chills, cough, gerd, n/v, diarrhea, brbpr, black stool, weakness beyond baseline. Pt was brought to ED for evaluation and trop negative.  Hospital Course:  Chest pan: atypical. No further episodes. Risk factors HTN, dylipidemia.Lipid panel within limits of normal. Troponin negative x4. Chest xray no active cardiopulmonary disease. EKG without acute changes. Echo with EF 09-98% grade 1 diastolic dysfunction. Evaluated by cardiology who recommended lexiscan that was negative.   Thrombocytopenia in setting of elevated LFT's taking dilantin and statin.  No s/sx bleeding.   Elevated transainase: trend unclear. Patient on Dilantin 10 years and currently on statin as well. Exam benign. S/p cholecystectomy remote. Stop statin. Recommend  stopping dilantin but will defer to neurology for changing anti seizure. OP abdominal US. Close OP follow up  Hyperglycemia: CBG 121. hga1c pending at discharge.  Myasthenia:  Evaluated by neurology who opine myasthenia gravis molstly ocular type without exacerbation or generalized myasthenic syndrome per note. Recommended continuing pyridostigmine at current dose. Close OP follow up   Procedures:  ECHO: The cavity size was normal. Wall thickness was increased in a pattern of moderate LVH. Systolic function was vigorous. The estimated ejection fraction was in the range of 65% to 70%. Wall motion was normal; there were no regional wall motion abnormalities. Doppler parameters are consistent with abnormal left ventricular relaxation (grade 1 diastolic dysfunction).  Consultations:  cardiology  Discharge Exam: Filed Vitals:   10/26/14 1500  BP: 124/65  Pulse: 48  Temp: 98.3 F (36.8 C)  Resp: 20    General: well nourished appears comfortable Cardiovascular: RRR no MGR Respiratory: normal effort BS clear bilaterally Abdomen: obese soft BS non-tender no organomegaly  Discharge Instructions   Discharge Instructions    Diet - low sodium heart healthy    Complete by:  As directed      Discharge instructions    Complete by:  As directed   Take medications as directed Stop statin Follow up with dr Jannifer Franklin as soon as possible regarding liver function tests and Dilantin Follow up with PCP as scheduled. Recommend recheck liver function test     Increase activity slowly    Complete by:  As directed           Current Discharge Medication List    START taking these medications   Details  omega-3 acid ethyl  esters (LOVAZA) 1 G capsule Take 1 capsule (1 g total) by mouth 3 (three) times daily. Qty: 30 capsule, Refills: 0    pantoprazole (PROTONIX) 40 MG tablet Take 1 tablet (40 mg total) by mouth daily at 6 (six) AM.      CONTINUE these medications which have  CHANGED   Details  amLODipine (NORVASC) 5 MG tablet Take 2 tablets (10 mg total) by mouth daily. Qty: 30 tablet, Refills: 0      CONTINUE these medications which have NOT CHANGED   Details  clobetasol (TEMOVATE) 0.05 % external solution Apply 1 application topically daily as needed (rash).     diclofenac (VOLTAREN) 75 MG EC tablet Take 1 tablet by mouth two  times daily Qty: 180 tablet, Refills: 1    DILANTIN 100 MG ER capsule Take 2 capsules by mouth  two times daily Qty: 360 capsule, Refills: 3    DILANTIN INFATABS 50 MG tablet Chew 0.5 tablets (25 mg total) by mouth 2 (two) times daily. Qty: 90 tablet, Refills: 3    fish oil-omega-3 fatty acids 1000 MG capsule Take 1 g by mouth 3 (three) times daily.    Garlic (GARLIQUE PO) Take 1 tablet by mouth daily.    niacinamide 500 MG tablet Take 500 mg by mouth 3 (three) times daily.    pyridostigmine (MESTINON) 60 MG tablet Take 0.5 tablets (30 mg total) by mouth 3 (three) times daily. Qty: 135 tablet, Refills: 3    tamsulosin (FLOMAX) 0.4 MG CAPS capsule Take 0.4 mg by mouth daily.      STOP taking these medications     atenolol (TENORMIN) 50 MG tablet      simvastatin (ZOCOR) 20 MG tablet      atenolol (TENORMIN) 25 MG tablet        Allergies  Allergen Reactions  . Bactericin [Bacitracin]   . Iohexol      Code: HIVES, Desc: pt was premedicated/hives reaction occurred 24 hours post injection, Onset Date: [id number]   . Sulfa Antibiotics Swelling and Rash   Follow-up Information    Follow up with Lenor Coffin, MD.   Specialty:  Neurology   Contact information:   [address] [place] [address] [phone]       Follow up with Rocky Morel, MD.   Specialty:  Family Medicine   Why:  follow up as scheduled. recommend recheck of liver function test   Contact information:   [address] [place] [address] [phone]        The results of significant diagnostics  from this hospitalization (including imaging, microbiology, ancillary and laboratory) are listed below for reference.    Significant Diagnostic Studies: Dg Chest 2 View  10/25/2014   CLINICAL DATA:  Patient woke up this morning with chest tightness.  EXAM: CHEST  2 VIEW  COMPARISON:  05/17/2008  FINDINGS: Hyperinflation. The heart size and mediastinal contours are within normal limits. Both lungs are clear. The visualized skeletal structures are unremarkable.  IMPRESSION: No active cardiopulmonary disease.   Electronically Signed   By: Lucienne Capers M.D.   On: 10/25/2014 05:25   Nm Myocar Multi W/spect W/wall Motion / Ef  10/26/2014    Defect 1: There is a small, fixed defect of mild severity present in the  basal inferolateral and mid inferolateral location. Likely due to  artifact, but scar cannot entirely be excluded.  This is a low risk study.  The left ventricular ejection fraction is normal (55-65%).  Nuclear stress EF: 60%.     Microbiology: No results found for this or any previous visit (from the past 240 hour(s)).   Labs: Basic Metabolic Panel:  Recent Labs Lab 10/25/14 0457 10/26/14 0912  NA 138 137  K 4.1 4.6  CL 104 103  CO2 24 27  GLUCOSE 121* 177*  BUN 23* 17  CREATININE 0.68 0.60*  CALCIUM 8.5* 8.6*   Liver Function Tests:  Recent Labs Lab 10/26/14 0912  AST 207*  ALT 349*  ALKPHOS 83  BILITOT 1.0  PROT 7.4  ALBUMIN 4.0   No results for input(s): LIPASE, AMYLASE in the last 168 hours. No results for input(s): AMMONIA in the last 168 hours. CBC:  Recent Labs Lab 10/25/14 0457 10/26/14 0912  WBC 8.6 7.1  NEUTROABS 5.4 5.6  HGB 15.2 15.9  HCT 43.7 46.3  MCV 94.8 95.9  PLT 130* 120*   Cardiac Enzymes:  Recent Labs Lab 10/25/14 0457 10/25/14 0810 10/25/14 1342 10/25/14 1950  TROPONINI <0.03 <0.03 <0.03 <0.03   BNP: BNP (last 3 results)  Recent Labs  10/25/14 0457  BNP 90.0    ProBNP (last 3 results) No results for input(s):  PROBNP in the last 8760 hours.  CBG: No results for input(s): GLUCAP in the last 168 hours.     SignedRadene Gunning  [facility] 10/26/2014, 4:32 PM

## 2014-10-26 NOTE — Progress Notes (Addendum)
Consulting cardiologist: Kate Sable MD Primary Cardiologist: Carlyle Dolly MD  Cardiology Specific Problem List: 1. Hypertension 2. Dyspnea.  3. Chest Pain  Subjective:   Patient seen and examined in stress lab. He is without complaints of chest pain.   Objective:   Temp:  [97.8 F (36.6 C)-97.9 F (36.6 C)] 97.8 F (36.6 C) (07/15 0616) Pulse Rate:  [45-55] 52 (07/15 0616) Resp:  [20] 20 (07/15 0616) BP: (120-151)/(55-79) 151/79 mmHg (07/15 0616) SpO2:  [95 %-100 %] 100 % (07/15 0616) Weight:  [176 lb 12.8 oz (80.196 kg)] 176 lb 12.8 oz (80.196 kg) (07/15 0616) Last BM Date: 10/25/14  Filed Weights   10/25/14 0428 10/25/14 0609 10/26/14 0616  Weight: 170 lb (77.111 kg) 177 lb 12.8 oz (80.65 kg) 176 lb 12.8 oz (80.196 kg)    Intake/Output Summary (Last 24 hours) at 10/26/14 0831 Last data filed at 10/25/14 1737  Gross per 24 hour  Intake    720 ml  Output      0 ml  Net    720 ml    Telemetry: NSR with non-specific T-wave changes.   Exam:  General: No acute distress.  HEENT: Conjunctiva and lids normal, oropharynx clear.  Lungs: Clear to auscultation, nonlabored.  Cardiac: No elevated JVP or bruits. RRR, no gallop or rub.   Abdomen: Normoactive bowel sounds, nontender, nondistended.  Extremities: No pitting edema, distal pulses full.  Neuropsychiatric: Alert and oriented x3, affect appropriate.   Lab Results:  Basic Metabolic Panel:  Recent Labs Lab 10/25/14 0457  NA 138  K 4.1  CL 104  CO2 24  GLUCOSE 121*  BUN 23*  CREATININE 0.68  CALCIUM 8.5*    CBC:  Recent Labs Lab 10/25/14 0457  WBC 8.6  HGB 15.2  HCT 43.7  MCV 94.8  PLT 130*    Cardiac Enzymes:  Recent Labs Lab 10/25/14 0810 10/25/14 1342 10/25/14 1950  TROPONINI <0.03 <0.03 <0.03    BNP: No results for input(s): PROBNP in the last 8760 hours.  Coagulation: No results for input(s): INR in the last 168 hours.  Radiology: Dg Chest 2  View  10/25/2014   CLINICAL DATA:  Patient woke up this morning with chest tightness.  EXAM: CHEST  2 VIEW  COMPARISON:  05/17/2008  FINDINGS: Hyperinflation. The heart size and mediastinal contours are within normal limits. Both lungs are clear. The visualized skeletal structures are unremarkable.  IMPRESSION: No active cardiopulmonary disease.   Electronically Signed   By: Lucienne Capers M.D.   On: 10/25/2014 05:25     ECG:   Medications:   Scheduled Medications: . amLODipine  2.5 mg Oral Daily  . aspirin EC  81 mg Oral Daily  . atenolol  25 mg Oral Daily  . diclofenac  75 mg Oral BID WC  . enoxaparin (LOVENOX) injection  40 mg Subcutaneous Q24H  . omega-3 acid ethyl esters  1 g Oral TID  . pantoprazole  40 mg Oral Q0600  . phenytoin  25 mg Oral BID  . phenytoin  200 mg Oral BID  . pyridostigmine  30 mg Oral TID  . simvastatin  20 mg Oral QPM  . sodium chloride  3 mL Intravenous Q12H  . tamsulosin  0.4 mg Oral Daily    Infusions:    PRN Medications: acetaminophen **OR** acetaminophen, technetium sestamibi generic   Assessment and Plan:   1. Chest Pain:  Troponin is negative X 2. He is undergoing Lexiscan stress test this am.  Follow up scintigraphy will provided definitive information concerning need for more invasive testing.  2. Dyspnea; No significant symptoms at this time.  3. Hypertension:  BP is well controlled.   Phill Myron. Lawrence NP [facility]  10/26/2014, 8:31 AM   The patient was seen and examined, and I agree with the assessment and plan as documented above, with modifications as noted below.  Pt admitted with chest pain and ruled out for an ACS with serial normal troponins. Symptomatology very atypical for ischemic heart disease. Given sinus bradycardia, atenolol decreased to 25 mg daily yesterday. Agree that this can be d/c altogether and amlodipine can be increased for optimal BP control. AST 207, ALT 349 (both elevated), possibly secondary to chronic  phenytoin use. Will need outpatient workup. Awaiting nuclear imaging results. If normal, can be discharged.

## 2014-10-26 NOTE — Progress Notes (Signed)
NURSING PROGRESS NOTE  Kyle [name] [id number] Discharge Data: 10/26/2014 5:25 PM Attending Provider: Kathie Dike, MD GYB:[id number], Kassie Mends, MD   [name]. to be D/C'd Home per MD order.    All IV's discontinued and monitored for bleeding.  All belongings returned to patient for patient to take home.  AVS summary and prescriptions reviewed with patient and spouse.  Patient left floor ambulatory, escorted by RN.  Last Documented Vital Signs:  Blood pressure 124/65, pulse 48, temperature 98.3 F (36.8 C), temperature source Oral, resp. rate 20, height 5\' 8"  (1.727 m), weight 80.196 kg (176 lb 12.8 oz), SpO2 97 %.  [name] D

## 2014-10-26 NOTE — Discharge Instructions (Signed)
Adenosine Stress Electrocardiography An adenosine stress electrocardiography is a test used to detect heart disease (coronary artery disease). Adenosine is a medicine that makes the heart arteries react as if you are exercising. Adenosine is given with a radioactive tracer. The "tracer" is a safe radioactive substance that travels in the bloodstream to the heart arteries. Special imaging cameras detect the tracer and help find blocked arteries in the heart. This test may be done with or without treadmill exercise.  LET [facility] CARE PROVIDER KNOW ABOUT:  Allergies, including latex allergies.  All prescription medicines you taking as well as all non-prescription and over-the-counter medicines, including herbs and vitamins.  Use of steroids (by mouth or creams).  Previous problems with anesthetics or novocaine.  History of blood clots or bleeding problems.  Previous surgery.  Other health problems such as kidney or lung conditions.  Possibility of pregnancy, if this applies. RISKS AND COMPLICATIONS You may develop chest discomfort, shortness of breath, sweating, or light-headedness during the test. On rare occasions, you could experience a heart attack or your heart may go into a very fast or irregular rhythm. This could cause you to collapse. To ensure your safety, your health care provider will supervise the test. Your blood pressure and electrocardiogram are constantly watched. The test team watches for and is able to treat any problems. BEFORE THE PROCEDURE  Do not eat or drink caffeine for 12 to 24 hours before the test. This includes all caffeinated beverages and food, such as pop, coffee (roasted, instant, decaffeinated roasted, decaffeinated instant), hot chocolate, tea, and all chocolate.  Do not smoke on the day of your test. Smoking on the day of your test may change your test results.  Do not eat anything 3 hours before the test or as recommended by your health care provider.  Eating may cause an unclear image and may also cause nausea. If you are diabetic, talk to your health care provider regarding your insulin coverage.  Bring a list of all the medicines you are taking. Take your medicine as usual before the test except as told by the testing center.  Wear comfortable clothing, such as a short-sleeve shirt and sweatpants. Do not wear an underwire bra or jewelry. A hospital gown can be provided.  Shower before your appointment to reduce the spread of bacteria.  You may want to bring a book to read because there are some waiting periods during the test.  Your health care provider will go over the adenosine stress test with you, such as procedure protocol, what to expect, how long it will take, and results. PROCEDURE   An IV will be started in a vein in your hand or arm.  Electrode patches will be placed on your chest. The electrodes are connected to a monitor so your heart rhythm and heart rate can be watched. Your blood pressure will also be monitored during the test.  Two sets of images are usually taken of your heart. The images compare your heart at rest and when it is "stressed." This first image is a "resting" picture of your heart. The "resting" image is usually done before adenosine is given.  Adenosine is given in the IV over a period of 4 to 6 minutes.  After the adenosine is given, you will be monitored for a few minutes afterward to ensure your heart rate, heart rhythm, and blood pressure are normal. AFTER THE PROCEDURE  When your test is completed, you may be asked to schedule an office  visit with your health care provider to discuss the test results, or your health care provider may choose to call you with the results.  Document Released: 06/07/2006 Document Revised: 08/14/2013 Document Reviewed: 07/15/2011 [facility] Patient Information 2015 [place], [place]. This information is not intended to replace advice given to you by your health care provider.  Make sure you discuss any questions you have with your health care provider.  Chest Pain Observation It is often hard to give a specific diagnosis for the cause of chest pain. Among other possibilities your symptoms might be caused by inadequate oxygen delivery to your heart (angina). Angina that is not treated or evaluated can lead to a heart attack (myocardial infarction) or death. Blood tests, electrocardiograms, and X-rays may have been done to help determine a possible cause of your chest pain. After evaluation and observation, your health care provider has determined that it is unlikely your pain was caused by an unstable condition that requires hospitalization. However, a full evaluation of your pain may need to be completed, with additional diagnostic testing as directed. It is very important to keep your follow-up appointments. Not keeping your follow-up appointments could result in permanent heart damage, disability, or death. If there is any problem keeping your follow-up appointments, you must call your health care provider. HOME CARE INSTRUCTIONS  Due to the slight chance that your pain could be angina, it is important to follow your health care provider's treatment plan and also maintain a healthy lifestyle:  Maintain or work toward achieving a healthy weight.  Stay physically active and exercise regularly.  Decrease your salt intake.  Eat a balanced, healthy diet. Talk to a dietitian to learn about heart-healthy foods.  Increase your fiber intake by including whole grains, vegetables, fruits, and nuts in your diet.  Avoid situations that cause stress, anger, or depression.  Take medicines as advised by your health care provider. Report any side effects to your health care provider. Do not stop medicines or adjust the dosages on your own.  Quit smoking. Do not use nicotine patches or gum until you check with your health care provider.  Keep your blood pressure, blood sugar, and  cholesterol levels within normal limits.  Limit alcohol intake to no more than 1 drink per day for women who are not pregnant and 2 drinks per day for men.  Do not abuse drugs. SEEK IMMEDIATE MEDICAL CARE IF: You have severe chest pain or pressure which may include symptoms such as:  You feel pain or pressure in your arms, neck, jaw, or back.  You have severe back or abdominal pain, feel sick to your stomach (nauseous), or throw up (vomit).  You are sweating profusely.  You are having a fast or irregular heartbeat.  You feel short of breath while at rest.  You notice increasing shortness of breath during rest, sleep, or with activity.  You have chest pain that does not get better after rest or after taking your usual medicine.  You wake from sleep with chest pain.  You are unable to sleep because you cannot breathe.  You develop a frequent cough or you are coughing up blood.  You feel dizzy, faint, or experience extreme fatigue.  You develop severe weakness, dizziness, fainting, or chills. Any of these symptoms may represent a serious problem that is an emergency. Do not wait to see if the symptoms will go away. Call your local emergency services (911 in the [place] Do not drive yourself to the hospital. MAKE  SURE YOU:  Understand these instructions.  Will watch your condition.  Will get help right away if you are not doing well or get worse. Document Released: 05/02/2010 Document Revised: 04/04/2013 Document Reviewed: 09/29/2012 [facility] Patient Information 2015 [place], [place]. This information is not intended to replace advice given to you by your health care provider. Make sure you discuss any questions you have with your health care provider.

## 2014-10-27 LAB — HEMOGLOBIN A1C
HEMOGLOBIN A1C: 5.9 % — AB (ref 4.8–5.6)
Mean Plasma Glucose: 123 mg/dL

## 2014-10-28 LAB — HOMOCYSTEINE: Homocysteine: 8.7 umol/L (ref 0.0–15.0)

## 2014-10-30 ENCOUNTER — Ambulatory Visit (INDEPENDENT_AMBULATORY_CARE_PROVIDER_SITE_OTHER): Payer: Medicare Other | Admitting: Neurology

## 2014-10-30 ENCOUNTER — Encounter: Payer: Self-pay | Admitting: Neurology

## 2014-10-30 VITALS — BP 155/73 | HR 48 | Ht 68.0 in | Wt 178.6 lb

## 2014-10-30 DIAGNOSIS — R569 Unspecified convulsions: Secondary | ICD-10-CM | POA: Diagnosis not present

## 2014-10-30 DIAGNOSIS — G7000 Myasthenia gravis without (acute) exacerbation: Secondary | ICD-10-CM | POA: Diagnosis not present

## 2014-10-30 MED ORDER — LEVETIRACETAM 500 MG PO TABS: ORAL_TABLET | ORAL | Status: DC

## 2014-10-30 NOTE — Patient Instructions (Addendum)
We will start a new seizure medication called Keppra (levacitram). Will start on 500 mg, taking one half tablet in the morning and 1 full tablet in the evening. Look out for irritability and drowsiness. You can stop the 50 mg Dilantin tablet as seizures start the [place]. After you have been on both Dilantin and Keppra for 2 weeks, initiate a taper off of Dilantin by 100 mg every 2 weeks until you are off the Dilantin. If you have any problems with the taper, please let me know.  Epilepsy Epilepsy is a disorder in which a person has repeated seizures over time. A seizure is a release of abnormal electrical activity in the brain. Seizures can cause a change in attention, behavior, or the ability to remain awake and alert (altered mental status). Seizures often involve uncontrollable shaking (convulsions).  Most people with epilepsy lead normal lives. However, people with epilepsy are at an increased risk of falls, accidents, and injuries. Therefore, it is important to begin treatment right away. CAUSES  Epilepsy has many possible causes. Anything that disturbs the normal pattern of brain cell activity can lead to seizures. This may include:   Head injury.  Birth trauma.  High fever as a child.  Stroke.  Bleeding into or around the brain.  Certain drugs.  Prolonged low oxygen, such as what occurs after CPR efforts.  Abnormal brain development.  Certain illnesses, such as meningitis, encephalitis (brain infection), malaria, and other infections.  An imbalance of nerve signaling chemicals (neurotransmitters).  SIGNS AND SYMPTOMS  The symptoms of a seizure can vary greatly from one person to another. Right before a seizure, you may have a warning (aura) that a seizure is about to occur. An aura may include the following symptoms:  Fear or anxiety.  Nausea.  Feeling like the room is spinning (vertigo).  Vision changes, such as seeing flashing lights or spots. Common symptoms during  a seizure include:  Abnormal sensations, such as an abnormal smell or a bitter taste in the mouth.   Sudden, general body stiffness.   Convulsions that involve rhythmic jerking of the face, arm, or leg on one or both sides.   Sudden change in consciousness.   Appearing to be awake but not responding.   Appearing to be asleep but cannot be awakened.   Grimacing, chewing, lip smacking, drooling, tongue biting, or loss of bowel or bladder control. After a seizure, you may feel sleepy for a while. DIAGNOSIS  Your health care provider will ask about your symptoms and take a medical history. Descriptions from any witnesses to your seizures will be very helpful in the diagnosis. A physical exam, including a detailed neurological exam, is necessary. Various tests may be done, such as:   An electroencephalogram (EEG). This is a painless test of your brain waves. In this test, a diagram is created of your brain waves. These diagrams can be interpreted by a specialist.  An MRI of the brain.   A CT scan of the brain.   A spinal tap (lumbar puncture, LP).  Blood tests to check for signs of infection or abnormal blood chemistry. TREATMENT  There is no cure for epilepsy, but it is generally treatable. Once epilepsy is diagnosed, it is important to begin treatment as soon as possible. For most people with epilepsy, seizures can be controlled with medicines. The following may also be used:  A pacemaker for the brain (vagus nerve stimulator) can be used for people with seizures that are not  well controlled by medicine.  Surgery on the brain. For some people, epilepsy eventually goes away. HOME CARE INSTRUCTIONS   Follow your health care provider's recommendations on driving and safety in normal activities.  Get enough rest. Lack of sleep can cause seizures.  Only take over-the-counter or prescription medicines as directed by your health care provider. Take any prescribed medicine  exactly as directed.  Avoid any known triggers of your seizures.  Keep a seizure diary. Record what you recall about any seizure, especially any possible trigger.   Make sure the people you live and work with know that you are prone to seizures. They should receive instructions on how to help you. In general, a witness to a seizure should:   Cushion your head and body.   Turn you on your side.   Avoid unnecessarily restraining you.   Not place anything inside your mouth.   Call for emergency medical help if there is any question about what has occurred.   Follow up with your health care provider as directed. You may need regular blood tests to monitor the levels of your medicine.  SEEK MEDICAL CARE IF:   You develop signs of infection or other illness. This might increase the risk of a seizure.   You seem to be having more frequent seizures.   Your seizure pattern is changing.  SEEK IMMEDIATE MEDICAL CARE IF:   You have a seizure that does not stop after a few moments.   You have a seizure that causes any difficulty in breathing.   You have a seizure that results in a very severe headache.   You have a seizure that leaves you with the inability to speak or use a part of your body.  Document Released: 03/30/2005 Document Revised: 01/18/2013 Document Reviewed: 11/09/2012 [facility] Patient Information 2015 [place], [place]. This information is not intended to replace advice given to you by your health care provider. Make sure you discuss any questions you have with your health care provider.

## 2014-10-30 NOTE — Progress Notes (Signed)
Reason for visit: Myasthenia gravis  Jayanth Szczesniak. is an 80 y.o. male  History of present illness:  Mr. Vigen is a 80 year old right-handed white male with a history of myasthenia gravis, and a history of seizures. The patient has not had a seizure in over 1 decade, he has been on Dilantin therapy with good tolerance and control of the seizures. The patient was admitted to the hospital on 10/25/2014 with onset of shortness of breath at nighttime. The patient underwent a cardiac workup that was unremarkable. He was noted to have some bradycardia, and the beta blocker was discontinued, and the Norvasc dose was doubled. The patient was noted to have an elevation in liver enzymes, and he was taken off of his statin medication, and there is a question whether or not the Dilantin should be discontinued. The patient comes to this office for that reason. The patient has not had any weakness of the arms or legs per se, but he does have some mild fatigability of the legs at times. He denies any problems with speech or swallowing, he denies ptosis of the eyes, or double vision.  Past Medical History  Diagnosis Date  . Hypertension   . Auto immune neutropenia   . Arthritis   . Ocular myasthenia gravis   . Seizures   . Sleep paralysis   . Dyslipidemia   . History of prostate cancer   . Bullous pemphigoid   . Myasthenia 02/14/2013  . Nocturnal leg cramps   . Thyroid nodule, cold   . Sinus bradycardia   . Meningioma     a. s/p resection 1990s.  Marland Kitchen DVT (deep venous thrombosis)     a. Has had a previous right lower extremity DVT in the setting of hospitalization and surgery (after his brain tumor was resected in 1993) but is no longer on Coumadin.  . Cancer     Prostate    Past Surgical History  Procedure Laterality Date  . Brain surgery    . Cholecystectomy    . Radioactive seed implant    . Colonoscopy N/A 07/25/2013    Procedure: COLONOSCOPY;  Surgeon: Jamesetta So, MD;  Location:  [facility];  Service: Gastroenterology;  Laterality: N/A;    Family History  Problem Relation Age of Onset  . Heart failure Daughter   . Heart failure Mother   . Diabetes Mother   . Diabetes Sister   . Lung cancer Son   . Colon cancer Neg Hx     Social history:  reports that he has never smoked. He has never used smokeless tobacco. He reports that he does not drink alcohol or use illicit drugs.    Allergies  Allergen Reactions  . Bactericin [Bacitracin]   . Iohexol      Code: HIVES, Desc: pt was premedicated/hives reaction occurred 24 hours post injection, Onset Date: [id number]   . Sulfa Antibiotics Swelling and Rash    Medications:  Prior to Admission medications   Medication Sig Start Date End Date Taking? Authorizing Provider  amLODipine (NORVASC) 5 MG tablet Take 2 tablets (10 mg total) by mouth daily. 10/26/14  Yes Lezlie Octave Black, NP  clobetasol (TEMOVATE) 0.05 % external solution Apply 1 application topically daily as needed (rash).  11/17/12  Yes Historical Provider, MD  diclofenac (VOLTAREN) 75 MG EC tablet Take 1 tablet by mouth two  times daily 08/15/14  Yes Kathrynn Ducking, MD  DILANTIN 100 MG ER capsule Take 2 capsules  by mouth  two times daily 02/14/14  Yes Kathrynn Ducking, MD  fish oil-omega-3 fatty acids 1000 MG capsule Take 1 g by mouth 3 (three) times daily.   Yes Historical Provider, MD  Garlic (GARLIQUE PO) Take 1 tablet by mouth daily.   Yes Historical Provider, MD  niacinamide 500 MG tablet Take 500 mg by mouth 3 (three) times daily.   Yes Historical Provider, MD  pantoprazole (PROTONIX) 40 MG tablet Take 1 tablet (40 mg total) by mouth daily at 6 (six) AM. 10/26/14  Yes Radene Gunning, NP  pyridostigmine (MESTINON) 60 MG tablet Take 0.5 tablets (30 mg total) by mouth 3 (three) times daily. 02/14/14  Yes Kathrynn Ducking, MD  tamsulosin (FLOMAX) 0.4 MG CAPS capsule Take 0.4 mg by mouth daily. 09/29/12  Yes Historical Provider, MD    ROS:   Out of a complete 14  system review of symptoms, the patient complains only of the following symptoms, and all other reviewed systems are negative.  Muscle cramps  Blood pressure 155/73, pulse 48, height 5\' 8"  (1.727 m), weight 178 lb 9.6 oz (81.012 kg).  Physical Exam  General: The patient is alert and cooperative at the time of the examination.  Skin: No significant peripheral edema is noted.   Neurologic Exam  Mental status: The patient is alert and oriented x 3 at the time of the examination. The patient has apparent normal recent and remote memory, with an apparently normal attention span and concentration ability.   Cranial nerves: Facial symmetry is present. Speech is normal, no aphasia or dysarthria is noted. Extraocular movements are full. Visual fields are full. With superior gaze for 1 minute, no ptosis or divergence of gaze or subjective double vision was noted.  Motor: The patient has good strength in all 4 extremities. With arms outstretched 1 minute, no fatigable weakness of the arms was noted.  Sensory examination: Soft touch sensation is symmetric on the face, arms, and legs.  Coordination: The patient has good finger-nose-finger and heel-to-shin bilaterally.  Gait and station: The patient has a normal gait. Tandem gait is normal. Romberg is negative. No drift is seen.  Reflexes: Deep tendon reflexes are symmetric.   Assessment/Plan:  1. Myasthenia gravis  2. History of seizures  3. Nocturnal leg cramps  The patient has had some elevation in liver enzymes recently. The patient will be placed on Keppra, and after a two-week overlap, the patient will be tapered off of Dilantin by 100 mg every 2 weeks. The patient will contact our office if he is not tolerating the medication well. He otherwise will follow-up for his next routine revisit in February 2016. The liver enzymes may need to be checked in the next several weeks.  Jill Alexanders MD 10/30/2014 7:47 PM  [facility] [address] [place], [place] [address]  Phone [phone] Fax [phone]

## 2015-01-31 ENCOUNTER — Ambulatory Visit (INDEPENDENT_AMBULATORY_CARE_PROVIDER_SITE_OTHER): Payer: Medicare Other | Admitting: "Endocrinology

## 2015-01-31 ENCOUNTER — Encounter: Payer: Self-pay | Admitting: "Endocrinology

## 2015-01-31 VITALS — BP 154/74 | HR 59 | Ht 68.0 in | Wt 177.0 lb

## 2015-01-31 DIAGNOSIS — E042 Nontoxic multinodular goiter: Secondary | ICD-10-CM | POA: Diagnosis not present
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
## 2015-02-08 ENCOUNTER — Other Ambulatory Visit: Payer: Self-pay | Admitting: Neurology

## 2015-02-15 ENCOUNTER — Telehealth: Payer: Self-pay | Admitting: Neurology

## 2015-02-15 MED ORDER — LEVETIRACETAM 500 MG PO TABS: ORAL_TABLET | ORAL | Status: DC

## 2015-02-15 NOTE — Telephone Encounter (Signed)
Rx has been sent.  Receipt confirmed by pharmacy.  Called back to advise. Got no answer.  Left message.

## 2015-02-15 NOTE — Telephone Encounter (Signed)
Patient is needing a refill on Keppra 500 MG. He wants it sent to [facility] and he said it usually is a 3 month supply. The best number to contact him is [phone]

## 2015-05-21 ENCOUNTER — Telehealth: Payer: Self-pay

## 2015-05-21 ENCOUNTER — Ambulatory Visit: Payer: Medicare Other | Admitting: Neurology

## 2015-05-21 NOTE — Telephone Encounter (Signed)
Patient did not come to a follow up appointment today.  

## 2015-05-23 ENCOUNTER — Encounter: Payer: Self-pay | Admitting: Neurology

## 2015-05-24 ENCOUNTER — Other Ambulatory Visit (HOSPITAL_COMMUNITY): Payer: Self-pay | Admitting: Internal Medicine

## 2015-05-24 ENCOUNTER — Ambulatory Visit (HOSPITAL_COMMUNITY)
Admission: RE | Admit: 2015-05-24 | Discharge: 2015-05-24 | Disposition: A | Discharge status: Home / Self Care | Payer: Medicare Other | Source: Ambulatory Visit | Attending: Internal Medicine | Admitting: Internal Medicine

## 2015-05-24 DIAGNOSIS — Z1389 Encounter for screening for other disorder: Secondary | ICD-10-CM | POA: Insufficient documentation

## 2015-05-24 DIAGNOSIS — R05 Cough: Secondary | ICD-10-CM

## 2015-05-24 DIAGNOSIS — R059 Cough, unspecified: Secondary | ICD-10-CM

## 2015-06-20 ENCOUNTER — Other Ambulatory Visit: Payer: Self-pay | Admitting: Neurology

## 2015-06-28 DIAGNOSIS — C61 Malignant neoplasm of prostate: Secondary | ICD-10-CM | POA: Insufficient documentation

## 2015-07-17 ENCOUNTER — Other Ambulatory Visit: Payer: Self-pay | Admitting: *Deleted

## 2015-07-17 MED ORDER — PYRIDOSTIGMINE BROMIDE 60 MG PO TABS: 30.0000 mg | ORAL_TABLET | Freq: Three times a day (TID) | ORAL | Status: DC

## 2015-07-23 ENCOUNTER — Telehealth: Payer: Self-pay

## 2015-07-23 NOTE — Telephone Encounter (Signed)
Called pt to offer sooner appt. Spoke to pt's wife who agreed to scheduling appt @ 8 am tomorrow for f/u of MG and seizures.

## 2015-07-24 ENCOUNTER — Encounter: Payer: Self-pay | Admitting: Neurology

## 2015-07-24 ENCOUNTER — Ambulatory Visit (INDEPENDENT_AMBULATORY_CARE_PROVIDER_SITE_OTHER): Payer: Medicare Other | Admitting: Neurology

## 2015-07-24 VITALS — BP 138/78 | HR 54 | Ht 68.0 in | Wt 181.0 lb

## 2015-07-24 DIAGNOSIS — R569 Unspecified convulsions: Secondary | ICD-10-CM

## 2015-07-24 DIAGNOSIS — G7000 Myasthenia gravis without (acute) exacerbation: Secondary | ICD-10-CM

## 2015-07-24 DIAGNOSIS — G4762 Sleep related leg cramps: Secondary | ICD-10-CM | POA: Insufficient documentation

## 2015-07-24 MED ORDER — LEVETIRACETAM 500 MG PO TABS: ORAL_TABLET | ORAL | Status: DC

## 2015-07-24 MED ORDER — ATENOLOL 25 MG PO TABS: 25.0000 mg | ORAL_TABLET | Freq: Every day | ORAL | Status: DC

## 2015-07-24 MED ORDER — DICLOFENAC SODIUM 75 MG PO TBEC: DELAYED_RELEASE_TABLET | ORAL | Status: DC

## 2015-07-24 NOTE — Progress Notes (Signed)
Reason for visit: Myasthenia gravis  Broc Parks. is an 81 y.o. male  History of present illness:  Mr. Refuerzo is a 81 year old right-handed white male with a history of myasthenia gravis and a history of seizures. The patient has done well with the seizures, he has been switched to [place] and he is taking 500 mg twice daily. He is tolerating the medication, and he has had no seizure recurrence in over a decade. He operates a Teacher, music without difficulty. He suffers from nocturnal leg cramps, this is been a significant issue for him, occurring virtually on a nightly basis. He has recently been placed on gabapentin 300 mg in the evening which has helped some, but he still has some breakthrough issues with cramps. He has noted some droopiness of the right eyelid over the last 2 or 3 months. He denies any double vision, occasionally the eyelid will close down completely. The patient does have some difficulty with opening his mouth, not closing the mouth, he denies any weakness of the extremities with exception that he has a chronic sensation of mild weakness in the legs. He denies any falls. He returns to this office for an evaluation.  Past Medical History  Diagnosis Date  . Hypertension   . Auto immune neutropenia (HCC)   . Arthritis   . Ocular myasthenia gravis ([place])   . Seizures ([place])   . Sleep paralysis   . Dyslipidemia   . History of prostate cancer   . Bullous pemphigoid   . Myasthenia ([place]) 02/14/2013  . Nocturnal leg cramps   . Thyroid nodule, cold   . Sinus bradycardia   . Meningioma ([place])     a. s/p resection 1990s.  Marland Kitchen DVT (deep venous thrombosis) ([place])     a. Has had a previous right lower extremity DVT in the setting of hospitalization and surgery (after his brain tumor was resected in 1993) but is no longer on Coumadin.  . Cancer [facility])     Prostate    Past Surgical History  Procedure Laterality Date  . Brain surgery    . Cholecystectomy    . Radioactive  seed implant    . Colonoscopy N/A 07/25/2013    Procedure: COLONOSCOPY;  Surgeon: Jamesetta So, MD;  Location: [facility];  Service: Gastroenterology;  Laterality: N/A;    Family History  Problem Relation Age of Onset  . Heart failure Daughter   . Heart failure Mother   . Diabetes Mother   . Diabetes Sister   . Lung cancer Son   . Colon cancer Neg Hx     Social history:  reports that he has never smoked. He has never used smokeless tobacco. He reports that he does not drink alcohol or use illicit drugs.    Allergies  Allergen Reactions  . Bactericin [Bacitracin]   . Iohexol      Code: HIVES, Desc: pt was premedicated/hives reaction occurred 24 hours post injection, Onset Date: [id number]   . Ivp Dye [Iodinated Diagnostic Agents]   . Sulfa Antibiotics Swelling and Rash    Medications:  Prior to Admission medications   Medication Sig Start Date End Date Taking? Authorizing Provider  amLODipine (NORVASC) 5 MG tablet Take 2 tablets (10 mg total) by mouth daily. 10/26/14   Radene Gunning, NP  clobetasol (TEMOVATE) 0.05 % external solution Apply 1 application topically daily as needed (rash).  11/17/12   Historical Provider, MD  diclofenac (VOLTAREN) 75 MG EC tablet  Take 1 tablet by mouth two  times daily 02/08/15   Kathrynn Ducking, MD  DILANTIN 100 MG ER capsule Take 2 capsules by mouth  two times daily Patient not taking: Reported on 01/31/2015 02/14/14   Kathrynn Ducking, MD  fish oil-omega-3 fatty acids 1000 MG capsule Take 1 g by mouth 3 (three) times daily.    Historical Provider, MD  Garlic (GARLIQUE PO) Take 1 tablet by mouth daily.    Historical Provider, MD  levETIRAcetam (KEPPRA) 500 MG tablet Take 1/2 tablet by mouth in the morning and 1 tablet in the evening 06/20/15   Kathrynn Ducking, MD  niacinamide 500 MG tablet Take 500 mg by mouth 3 (three) times daily.    Historical Provider, MD  pantoprazole (PROTONIX) 40 MG tablet Take 1 tablet (40 mg total) by mouth daily at 6  (six) AM. 10/26/14   Radene Gunning, NP  pyridostigmine (MESTINON) 60 MG tablet Take 0.5 tablets (30 mg total) by mouth 3 (three) times daily. 07/17/15   Kathrynn Ducking, MD  tamsulosin (FLOMAX) 0.4 MG CAPS capsule Take 0.4 mg by mouth daily. 09/29/12   Historical Provider, MD    ROS:  Out of a complete 14 system review of symptoms, the patient complains only of the following symptoms, and all other reviewed systems are negative.  Eye itching Muscle cramps, neck stiffness Ptosis, right eye Bruising easily  Blood pressure 138/78, pulse 54, height 5\' 8"  (1.727 m), weight 181 lb (82.101 kg).  Physical Exam  General: The patient is alert and cooperative at the time of the examination.  Skin: No significant peripheral edema is noted.   Neurologic Exam  Mental status: The patient is alert and oriented x 3 at the time of the examination. The patient has apparent normal recent and remote memory, with an apparently normal attention span and concentration ability.   Cranial nerves: Facial symmetry is not present. Mild ptosis of the right eye is noted. Speech is normal, no aphasia or dysarthria is noted. Extraocular movements are full. Visual fields are full. With superior gaze for 1 minute, there may be some slight increase in right eye ptosis, no divergence of gaze or subjective double vision.  Motor: The patient has good strength in all 4 extremities. With abduction of the arms bilaterally for 1 minute, the patient has no fatigable weakness of the deltoid muscles.  Sensory examination: Soft touch sensation is symmetric on the face, arms, and legs.  Coordination: The patient has good finger-nose-finger and heel-to-shin bilaterally.  Gait and station: The patient has a normal gait. Tandem gait is unsteady. Romberg is negative. No drift is seen.  Reflexes: Deep tendon reflexes are symmetric.   Assessment/Plan:  1. Myasthenia gravis  2. History of seizures, well controlled  3.  Nocturnal leg cramps  The patient is having some ptosis of the right eye, the patient is on Mestinon taking 60 mg, one half tablet 3 times daily. He is to add an extra half tablet for the ptosis when it does occur. If he is consistently taking more tablets throughout the day, we will increase his maintenance dose. He is to contact our office for this. If the gabapentin does not help the nocturnal leg cramps, we will add baclofen in the future. The patient will follow-up in about 6 months. The patient is well controlled on Keppra, he will continue the medication for his seizures. A prescription was called in for the atenolol.  Jill Alexanders MD 07/24/2015  8:16 PM  [facility] [address] [place], [place] [address]  Phone [phone] Fax [phone]

## 2015-07-24 NOTE — Patient Instructions (Signed)
Myasthenia Gravis Myasthenia gravis (MG) means severe weakness. It is a long-term (chronic) condition that causes weakness in the muscles you can control (voluntary muscles). MG can affect any voluntary muscle. The muscles most often affected are the ones that control:   Eye movement.  Facial movements.  Swallowing. MG is an autoimmune disease, which means that your body's defense system (immune system) attacks healthy parts of your body instead of germs and other things that make you sick. When you have MG, your immune system makes proteins (antibodies) that block the chemical (acetylcholine) your body needs to send nerve signals to your muscles. This causes muscle weakness. CAUSES  The exact cause of MG is unknown. One possible cause is an enlarged thymus gland, which is located under your breastbone.  SIGNS AND SYMPTOMS The earliest symptom of MG is muscle weakness that gets worse with activity and gets better after rest. Other symptoms of MG may include:  Drooping eyelids.  Double vision.  Loss of facial expression.  Trouble chewing and swallowing.  Slurred speech.  A waddling walk.  Weakness of the arms, hands, and legs. Trouble breathing is the most dangerous symptom of MG. Sudden and severe difficulty breathing (myasthenic crisis) may require emergency breathing support. This symptom sometimes happens after:   Infection.  Fever.  Drug reaction. DIAGNOSIS  It can be hard to diagnose MG because muscle weakness is a common symptom in many conditions. Your health care provider will do a physical exam. You may also have tests that will help make a diagnosis. These may include:  A blood test.  A test using the medicine edrophonium. This medicine increases muscle strength by slowing the breakdown of acetylcholine.  Tests to measure nerve conduction to muscle (electromyography).  An imaging study of the chest (CT or MRI). TREATMENT  Treatment can improve muscle strength.  Sometimes symptoms of MG go away for a while (remission) and you can stop treatment. Possible treatments include:  Medicine.  Removal of the thymus gland (thymectomy). This may result in a long remission for some people. HOME CARE INSTRUCTIONS  Take medicines only as directed by your health care provider.  Get plenty of rest to conserve your energy.  Take frequent breaks to rest your eyes.  Maintain a healthy diet and a healthy weight.  Do not use any tobacco products including cigarettes, chewing tobacco, or electronic cigarettes. If you need help quitting, ask your health care provider.  Keep all follow-up visits as directed by your health care provider. This is important. SEEK MEDICAL CARE IF:  Your symptoms get worse after a fever or infection.  You have a reaction to a medicine you are taking.  Your symptoms change or get worse. SEEK IMMEDIATE MEDICAL CARE IF: You have trouble breathing.    This information is not intended to replace advice given to you by your health care provider. Make sure you discuss any questions you have with your health care provider.   Document Released: 07/06/2000 Document Revised: 04/20/2014 Document Reviewed: 05/31/2013 Elsevier Interactive Patient Education 2016 Elsevier Inc.  

## 2015-09-06 ENCOUNTER — Telehealth: Payer: Self-pay | Admitting: Neurology

## 2015-09-06 MED ORDER — PYRIDOSTIGMINE BROMIDE 60 MG PO TABS: 60.0000 mg | ORAL_TABLET | Freq: Three times a day (TID) | ORAL | Status: DC

## 2015-09-06 NOTE — Telephone Encounter (Signed)
I called the patient. He has increased the mestinon to 60 mg TID. I will call in a Rx to cover the increase. He is doing well with this.

## 2015-09-06 NOTE — Telephone Encounter (Signed)
Patient is calling regarding medication pyridostigmine (MESTINON) 60 MG tablet. He needs to discuss if there is a lower tier drug that would be cheaper than this medication.

## 2015-09-30 ENCOUNTER — Ambulatory Visit: Payer: Medicare Other | Admitting: Neurology

## 2015-12-12 ENCOUNTER — Ambulatory Visit: Payer: Medicare Other | Admitting: Neurology

## 2016-01-16 ENCOUNTER — Other Ambulatory Visit: Payer: Self-pay | Admitting: Neurology

## 2016-01-22 ENCOUNTER — Ambulatory Visit (INDEPENDENT_AMBULATORY_CARE_PROVIDER_SITE_OTHER): Payer: Medicare Other | Admitting: Neurology

## 2016-01-22 ENCOUNTER — Encounter: Payer: Self-pay | Admitting: Neurology

## 2016-01-22 VITALS — BP 153/80 | HR 57 | Ht 68.0 in | Wt 176.8 lb

## 2016-01-22 DIAGNOSIS — R569 Unspecified convulsions: Secondary | ICD-10-CM | POA: Diagnosis not present

## 2016-01-22 DIAGNOSIS — M47812 Spondylosis without myelopathy or radiculopathy, cervical region: Secondary | ICD-10-CM | POA: Insufficient documentation

## 2016-01-22 DIAGNOSIS — G7000 Myasthenia gravis without (acute) exacerbation: Secondary | ICD-10-CM | POA: Diagnosis not present

## 2016-01-22 DIAGNOSIS — G4762 Sleep related leg cramps: Secondary | ICD-10-CM

## 2016-01-22 HISTORY — DX: Spondylosis without myelopathy or radiculopathy, cervical region: M47.812

## 2016-01-22 MED ORDER — GABAPENTIN 100 MG PO CAPS: 100.0000 mg | ORAL_CAPSULE | Freq: Every day | ORAL | 2 refills | Status: DC

## 2016-01-22 NOTE — Progress Notes (Signed)
Reason for visit: Myasthenia gravis  Kyle [name]. is an 81 y.o. male  History of present illness:  Kyle [name] is a 81 year old right-handed white male with a history of myasthenia gravis with ocular features and a history of seizures. The patient has not had any seizures since last seen, he is doing well with his myasthenia, he only takes Mestinon. The patient has a history of classic migraine headaches, since he has come off of tamsulosin, he has had some visual aura associated with visual distortions, with circular distortions. The patient has nocturnal leg cramps, he may have episodes on average once a week. He may take magnesium supplementation at times. The patient also reports some cervical arthritis, some neck pain and stiffness that seems to get better when he gets up and moves around. When he is inactive, the stiffness and discomfort worsens. He denies any pain going down the arms. The patient overall is doing relatively well. He takes gabapentin at night for the nocturnal leg cramps and for the neck pain. He is also on diclofenac.  Past Medical History:  Diagnosis Date  . Arthritis   . Auto immune neutropenia (HCC)   . Bullous pemphigoid   . Cancer [facility])    Prostate  . DVT (deep venous thrombosis) ([place])    a. Has had a previous right lower extremity DVT in the setting of hospitalization and surgery (after his brain tumor was resected in 1993) but is no longer on Coumadin.  Marland Kitchen Dyslipidemia   . History of prostate cancer   . Hypertension   . Meningioma ([place])    a. s/p resection 1990s.  . Myasthenia ([place]) 02/14/2013  . Nocturnal leg cramps   . Ocular myasthenia gravis ([place])   . Seizures ([place])   . Sinus bradycardia   . Sleep paralysis   . Thyroid nodule, cold     Past Surgical History:  Procedure Laterality Date  . BRAIN SURGERY    . CHOLECYSTECTOMY    . COLONOSCOPY N/A 07/25/2013   Procedure: COLONOSCOPY;  Surgeon: Jamesetta So, MD;  Location[facility];   Service: Gastroenterology;  Laterality: N/A;  . RADIOACTIVE SEED IMPLANT      Family History  Problem Relation Age of Onset  . Heart failure Daughter   . Heart failure Mother   . Diabetes Mother   . Diabetes Sister   . Lung cancer Son   . Colon cancer Neg Hx     Social history:  reports that he has never smoked. He has never used smokeless tobacco. He reports that he does not drink alcohol or use drugs.    Allergies  Allergen Reactions  . Bactericin [Bacitracin]   . Iohexol      Code: HIVES, Desc: pt was premedicated/hives reaction occurred 24 hours post injection, Onset Date: [id number]   . Ivp Dye [Iodinated Diagnostic Agents]   . Sulfa Antibiotics Swelling and Rash    Medications:  Prior to Admission medications   Medication Sig Start Date End Date Taking? Authorizing Provider  atenolol (TENORMIN) 25 MG tablet Take 1 tablet (25 mg total) by mouth daily. 07/24/15  Yes Kathrynn Ducking, MD  clobetasol (TEMOVATE) 0.05 % external solution Apply 1 application topically daily as needed (rash).  11/17/12  Yes Historical Provider, MD  diclofenac (VOLTAREN) 75 MG EC tablet Take 1 tablet by mouth two  times daily 07/24/15  Yes Kathrynn Ducking, MD  fish oil-omega-3 fatty acids 1000 MG capsule Take 1 g  by mouth 3 (three) times daily.   Yes Historical Provider, MD  gabapentin (NEURONTIN) 300 MG capsule at bedtime. 07/03/15  Yes Historical Provider, MD  Garlic (GARLIQUE PO) Take 1 tablet by mouth daily.   Yes Historical Provider, MD  levETIRAcetam (KEPPRA) 500 MG tablet TAKE 1/2 TABLET BY MOUTH IN THE MORNING AND 1 TABLET IN THE EVENING 01/23/16  Yes Kathrynn Ducking, MD  niacinamide 500 MG tablet Take 500 mg by mouth 3 (three) times daily.   Yes Historical Provider, MD  pyridostigmine (MESTINON) 60 MG tablet Take 1 tablet (60 mg total) by mouth 3 (three) times daily. 09/06/15  Yes Kathrynn Ducking, MD    ROS:  Out of a complete 14 system review of symptoms, the patient complains only of the  following symptoms, and all other reviewed systems are negative.  Neck discomfort Leg cramps  Blood pressure (!) 153/80, pulse (!) 57, height 5\' 8"  (1.727 m), weight 176 lb 12 oz (80.2 kg).  Physical Exam  General: The patient is alert and cooperative at the time of the examination.  Skin: No significant peripheral edema is noted.   Neurologic Exam  Mental status: The patient is alert and oriented x 3 at the time of the examination. The patient has apparent normal recent and remote memory, with an apparently normal attention span and concentration ability.   Cranial nerves: Facial symmetry is present. Speech is normal, no aphasia or dysarthria is noted. Extraocular movements are full. Visual fields are full.  Motor: The patient has good strength in all 4 extremities.  Sensory examination: Soft touch sensation is symmetric on the face, arms, and legs.  Coordination: The patient has good finger-nose-finger and heel-to-shin bilaterally.  Gait and station: The patient has a normal gait. Tandem gait is slightly unsteady. Romberg is negative. No drift is seen.  Reflexes: Deep tendon reflexes are symmetric.   Assessment/Plan:  1. Myasthenia gravis with ocular features  2. History of seizures, well controlled  3. Cervical spondylosis  4. Nocturnal leg cramps  The patient is in general doing well, he is having some increased discomfort with the neck, we will increase the gabapentin taking 400 mg at night. A prescription for the 100 mg gabapentin capsules were called in. He will follow-up in 6 months, he will contact me for dose adjustments on the gabapentin.  Jill Alexanders MD 01/22/2016 7:47 AM  [facility] [address] [place], [place] [address]  Phone [phone] Fax [phone]

## 2016-01-22 NOTE — Patient Instructions (Addendum)
Leg Cramps Leg cramps occur when a muscle or muscles tighten and you have no control over this tightening (involuntary muscle contraction). Muscle cramps can develop in any muscle, but the most common place is in the calf muscles of the leg. Those cramps can occur during exercise or when you are at rest. Leg cramps are painful, and they may last for a few seconds to a few minutes. Cramps may return several times before they finally stop. Usually, leg cramps are not caused by a serious medical problem. In many cases, the cause is not known. Some common causes include:  Overexertion.  Overuse from repetitive motions, or doing the same thing over and over.  Remaining in a certain position for a long period of time.  Improper preparation, form, or technique while performing a sport or an activity.  Dehydration.  Injury.  Side effects of some medicines.  Abnormally low levels of the salts and ions in your blood (electrolytes), especially potassium and calcium. These levels could be low if you are taking water pills (diuretics) or if you are pregnant. HOME CARE INSTRUCTIONS Watch your condition for any changes. Taking the following actions may help to lessen any discomfort that you are feeling:  Stay well-hydrated. Drink enough fluid to keep your urine clear or pale yellow.  Try massaging, stretching, and relaxing the affected muscle. Do this for several minutes at a time.  For tight or tense muscles, use a warm towel, heating pad, or hot shower water directed to the affected area.  If you are sore or have pain after a cramp, applying ice to the affected area may relieve discomfort.  Put ice in a plastic bag.  Place a towel between your skin and the bag.  Leave the ice on for 20 minutes, 2-3 times per day.  Avoid strenuous exercise for several days if you have been having frequent leg cramps.  Make sure that your diet includes the essential minerals for your muscles to work  normally.  Take medicines only as directed by your health care provider. SEEK MEDICAL CARE IF:  Your leg cramps get more severe or more frequent, or they do not improve over time.  Your foot becomes cold, numb, or blue.   This information is not intended to replace advice given to you by your health care provider. Make sure you discuss any questions you have with your health care provider.   Document Released: 05/07/2004 Document Revised: 08/14/2014 Document Reviewed: 03/07/2014 Elsevier Interactive Patient Education 2016 Elsevier Inc.   

## 2016-01-27 ENCOUNTER — Other Ambulatory Visit: Payer: Self-pay | Admitting: "Endocrinology

## 2016-01-27 DIAGNOSIS — E039 Hypothyroidism, unspecified: Secondary | ICD-10-CM

## 2016-01-27 LAB — T4, FREE: FREE T4: 1.2 ng/dL (ref 0.8–1.8)

## 2016-01-27 LAB — TSH: TSH: 2.13 m[IU]/L (ref 0.40–4.50)

## 2016-02-03 ENCOUNTER — Ambulatory Visit (INDEPENDENT_AMBULATORY_CARE_PROVIDER_SITE_OTHER): Payer: Medicare Other | Admitting: "Endocrinology

## 2016-02-03 ENCOUNTER — Encounter: Payer: Self-pay | Admitting: "Endocrinology

## 2016-02-03 VITALS — BP 152/90 | HR 55 | Ht 68.0 in | Wt 181.0 lb

## 2016-02-03 DIAGNOSIS — I10 Essential (primary) hypertension: Secondary | ICD-10-CM | POA: Insufficient documentation

## 2016-02-03 DIAGNOSIS — E042 Nontoxic multinodular goiter: Secondary | ICD-10-CM
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
## 2016-03-13 ENCOUNTER — Other Ambulatory Visit: Payer: Self-pay

## 2016-03-13 ENCOUNTER — Telehealth: Payer: Self-pay | Admitting: *Deleted

## 2016-03-13 MED ORDER — GABAPENTIN 300 MG PO CAPS: 300.0000 mg | ORAL_CAPSULE | Freq: Every day | ORAL | 3 refills | Status: DC

## 2016-03-13 MED ORDER — GABAPENTIN 100 MG PO CAPS: 100.0000 mg | ORAL_CAPSULE | Freq: Every day | ORAL | 3 refills | Status: DC

## 2016-03-13 NOTE — Telephone Encounter (Signed)
90 day refills sent to mail order pharmacy as requested.

## 2016-04-14 DIAGNOSIS — H524 Presbyopia: Secondary | ICD-10-CM | POA: Diagnosis not present

## 2016-04-14 DIAGNOSIS — H5203 Hypermetropia, bilateral: Secondary | ICD-10-CM | POA: Diagnosis not present

## 2016-04-14 DIAGNOSIS — H52221 Regular astigmatism, right eye: Secondary | ICD-10-CM | POA: Diagnosis not present

## 2016-04-14 DIAGNOSIS — H25813 Combined forms of age-related cataract, bilateral: Secondary | ICD-10-CM | POA: Diagnosis not present

## 2016-04-22 ENCOUNTER — Other Ambulatory Visit: Payer: Self-pay | Admitting: Neurology

## 2016-05-12 ENCOUNTER — Encounter: Payer: Self-pay | Admitting: "Endocrinology

## 2016-05-18 MED ORDER — GABAPENTIN 400 MG PO CAPS: 400.0000 mg | ORAL_CAPSULE | Freq: Every day | ORAL | 3 refills | Status: DC

## 2016-05-18 NOTE — Telephone Encounter (Signed)
A prescription was sent into [facility] for gabapentin 400 mg capsules to take 1 at night.

## 2016-05-18 NOTE — Telephone Encounter (Signed)
Dr Jannifer Franklin- per your last OV note you wanted patient to increase to 400mg  at night. I see where 100mg  capsule was called in. Can you send in rx for  400mg  at night if this is correct to [facility]?  Thank you

## 2016-05-20 ENCOUNTER — Other Ambulatory Visit: Payer: Self-pay | Admitting: Neurology

## 2016-06-02 DIAGNOSIS — C61 Malignant neoplasm of prostate: Secondary | ICD-10-CM | POA: Diagnosis not present

## 2016-06-02 DIAGNOSIS — Z1389 Encounter for screening for other disorder: Secondary | ICD-10-CM | POA: Diagnosis not present

## 2016-06-02 DIAGNOSIS — Z0001 Encounter for general adult medical examination with abnormal findings: Secondary | ICD-10-CM | POA: Diagnosis not present

## 2016-06-02 DIAGNOSIS — E748 Other specified disorders of carbohydrate metabolism: Secondary | ICD-10-CM | POA: Diagnosis not present

## 2016-06-02 DIAGNOSIS — I10 Essential (primary) hypertension: Secondary | ICD-10-CM | POA: Diagnosis not present

## 2016-06-02 DIAGNOSIS — G7000 Myasthenia gravis without (acute) exacerbation: Secondary | ICD-10-CM | POA: Diagnosis not present

## 2016-06-29 DIAGNOSIS — N320 Bladder-neck obstruction: Secondary | ICD-10-CM | POA: Diagnosis not present

## 2016-06-29 DIAGNOSIS — C61 Malignant neoplasm of prostate: Secondary | ICD-10-CM | POA: Diagnosis not present

## 2016-07-04 ENCOUNTER — Other Ambulatory Visit: Payer: Self-pay | Admitting: Neurology

## 2016-07-08 ENCOUNTER — Other Ambulatory Visit: Payer: Self-pay | Admitting: Neurology

## 2016-07-14 IMAGING — NM NM MYOCAR MULTI W/SPECT W/WALL MOTION & EF
2 series · 12 of 12 positions shown · non-contrast
Comparison: none

[Series 1: rest · 8.28mm/px · 6 of 64 frames shown]
[frame 6/64]
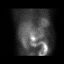
[frame 16/64]
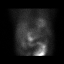
[frame 27/64]
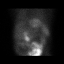
[frame 38/64]
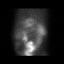
[frame 48/64]
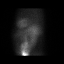
[frame 59/64]
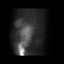

[Series 2: stress gated · 8.28mm/px · 6 of 64 frames shown]
[frame 6/64]
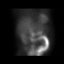
[frame 16/64]
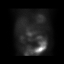
[frame 27/64]
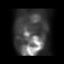
[frame 38/64]
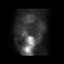
[frame 48/64]
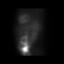
[frame 59/64]
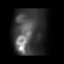

[12 of 12 positions shown; findings below may reference images not displayed]

Canned report from images found in remote index.

Refer to host system for actual result text.

## 2016-07-21 ENCOUNTER — Other Ambulatory Visit: Payer: Self-pay | Admitting: "Endocrinology

## 2016-07-21 ENCOUNTER — Ambulatory Visit (HOSPITAL_COMMUNITY)
Admission: RE | Admit: 2016-07-21 | Discharge: 2016-07-21 | Disposition: A | Discharge status: Home / Self Care | Payer: Medicare Other | Source: Ambulatory Visit | Attending: "Endocrinology | Admitting: "Endocrinology

## 2016-07-21 DIAGNOSIS — E042 Nontoxic multinodular goiter: Secondary | ICD-10-CM | POA: Diagnosis not present

## 2016-07-21 DIAGNOSIS — E041 Nontoxic single thyroid nodule: Secondary | ICD-10-CM | POA: Diagnosis not present

## 2016-07-22 ENCOUNTER — Ambulatory Visit (INDEPENDENT_AMBULATORY_CARE_PROVIDER_SITE_OTHER): Payer: Medicare Other | Admitting: Nurse Practitioner

## 2016-07-22 ENCOUNTER — Encounter: Payer: Self-pay | Admitting: Nurse Practitioner

## 2016-07-22 VITALS — BP 144/78 | HR 48 | Resp 18 | Ht 68.0 in | Wt 176.0 lb

## 2016-07-22 DIAGNOSIS — R569 Unspecified convulsions: Secondary | ICD-10-CM

## 2016-07-22 DIAGNOSIS — M47812 Spondylosis without myelopathy or radiculopathy, cervical region: Secondary | ICD-10-CM | POA: Diagnosis not present

## 2016-07-22 DIAGNOSIS — G4762 Sleep related leg cramps: Secondary | ICD-10-CM | POA: Diagnosis not present

## 2016-07-22 DIAGNOSIS — G7000 Myasthenia gravis without (acute) exacerbation: Secondary | ICD-10-CM | POA: Diagnosis not present

## 2016-07-22 LAB — TSH: TSH: 1.82 u[IU]/mL (ref 0.450–4.500)

## 2016-07-22 LAB — T4, FREE: Free T4: 1.34 ng/dL (ref 0.82–1.77)

## 2016-07-22 NOTE — Patient Instructions (Addendum)
Continue Keppra as directed for seizure disorder Continue gabapentin for restless legs Continue Mestinon for myasthenia gravis Given a copy of neck exercises Follow-up in 6 months  Neck Exercises Neck exercises can be important for many reasons:  They can help you to improve and maintain flexibility in your neck. This can be especially important as you age.  They can help to make your neck stronger. This can make movement easier.  They can reduce or prevent neck pain.  They may help your upper back. Ask your health care provider which neck exercises would be best for you. Exercises Neck Press  Repeat this exercise 10 times. Do it first thing in the morning and right before bed or as told by your health care provider. 1. Lie on your back on a firm bed or on the floor with a pillow under your head. 2. Use your neck muscles to push your head down on the pillow and straighten your spine. 3. Hold the position as well as you can. Keep your head facing up and your chin tucked. 4. Slowly count to 5 while holding this position. 5. Relax for a few seconds. Then repeat. Isometric Strengthening  Do a full set of these exercises 2 times a day or as told by your health care provider. 1. Sit in a supportive chair and place your hand on your forehead. 2. Push forward with your head and neck while pushing back with your hand. Hold for 10 seconds. 3. Relax. Then repeat the exercise 3 times. 4. Next, do thesequence again, this time putting your hand against the back of your head. Use your head and neck to push backward against the hand pressure. 5. Finally, do the same exercise on either side of your head, pushing sideways against the pressure of your hand. Prone Head Lifts  Repeat this exercise 5 times. Do this 2 times a day or as told by your health care provider. 1. Lie face-down, resting on your elbows so that your chest and upper back are raised. 2. Start with your head facing downward, near  your chest. Position your chin either on or near your chest. 3. Slowly lift your head upward. Lift until you are looking straight ahead. Then continue lifting your head as far back as you can stretch. 4. Hold your head up for 5 seconds. Then slowly lower it to your starting position. Supine Head Lifts  Repeat this exercise 8-10 times. Do this 2 times a day or as told by your health care provider. 1. Lie on your back, bending your knees to point to the ceiling and keeping your feet flat on the floor. 2. Lift your head slowly off the floor, raising your chin toward your chest. 3. Hold for 5 seconds. 4. Relax and repeat. Scapular Retraction  Repeat this exercise 5 times. Do this 2 times a day or as told by your health care provider. 1. Stand with your arms at your sides. Look straight ahead. 2. Slowly pull both shoulders backward and downward until you feel a stretch between your shoulder blades in your upper back. 3. Hold for 10-30 seconds. 4. Relax and repeat. Contact a health care provider if:  Your neck pain or discomfort gets much worse when you do an exercise.  Your neck pain or discomfort does not improve within 2 hours after you exercise. If you have any of these problems, stop exercising right away. Do not do the exercises again unless your health care provider says that you can.  Get help right away if:  You develop sudden, severe neck pain. If this happens, stop exercising right away. Do not do the exercises again unless your health care provider says that you can. Exercises Neck Stretch  Repeat this exercise 3-5 times. 1. Do this exercise while standing or while sitting in a chair. 2. Place your feet flat on the floor, shoulder-width apart. 3. Slowly turn your head to the right. Turn it all the way to the right so you can look over your right shoulder. Do not tilt or tip your head. 4. Hold this position for 10-30 seconds. 5. Slowly turn your head to the left, to look over your  left shoulder. 6. Hold this position for 10-30 seconds. Neck Retraction Repeat this exercise 8-10 times. Do this 3-4 times a day or as told by your health care provider. 1. Do this exercise while standing or while sitting in a sturdy chair. 2. Look straight ahead. Do not bend your neck. 3. Use your fingers to push your chin backward. Do not bend your neck for this movement. Continue to face straight ahead. If you are doing the exercise properly, you will feel a slight sensation in your throat and a stretch at the back of your neck. 4. Hold the stretch for 1-2 seconds. Relax and repeat. This information is not intended to replace advice given to you by your health care provider. Make sure you discuss any questions you have with your health care provider. Document Released: 03/11/2015 Document Revised: 09/05/2015 Document Reviewed: 10/08/2014 Elsevier Interactive Patient Education  2017 [employer].

## 2016-07-22 NOTE — Progress Notes (Signed)
I have read the note, and I agree with the clinical assessment and plan.  Jah Alarid KEITH   

## 2016-07-22 NOTE — Progress Notes (Signed)
[facility]  PATIENT: Kyle [name]. DOB: 10-09-1934   REASON FOR VISIT: follow up for myasthenia gravis, seizure  disorder, restless legs HISTORY FROM: Patient    HISTORY OF PRESENT ILLNESS:UPDATE 04/11/2018CMMr Kyle [name], 82 year old male returns for follow-up for myasthenia gravis with ocular features, seizure disorder and restless leg syndrome. She also has a history of cervical arthritis with neck pain and stiffness that is better when he moves about. He denies any pain in arms or sensory changes. He is currently on Mestinon for his myasthenia and has done well with this. Last seizure was 12 years ago. Patient reports he had surgery to remove a benign brain tumor in 1993. He denies any headaches today. He is on gabapentin 400 mg at night for his restless legs and he also has some diclofenac to take if needed. He returns for reevaluation  01/22/16 Kyle [name] is a 82 year old right-handed white male with a history of myasthenia gravis with ocular features and a history of seizures. The patient has not had any seizures since last seen, he is doing well with his myasthenia, he only takes Mestinon. The patient has a history of classic migraine headaches, since he has come off of tamsulosin, he has had some visual aura associated with visual distortions, with circular distortions. The patient has nocturnal leg cramps, he may have episodes on average once a week. He may take magnesium supplementation at times. The patient also reports some cervical arthritis, some neck pain and stiffness that seems to get better when he gets up and moves around. When he is inactive, the stiffness and discomfort worsens. He denies any pain going down the arms. The patient overall is doing relatively well. He takes gabapentin at night for the nocturnal leg cramps and for the neck pain. He is also on diclofenac.   REVIEW OF SYSTEMS: Full 14 system review of systems performed and notable only for  those listed, all others are neg:  Constitutional: neg  Cardiovascular: neg Ear/Nose/Throat: neg  Skin: neg Eyes: neg Respiratory: neg Gastroitestinal: neg  Hematology/Lymphatic: neg  Endocrine: neg Musculoskeletal: Neck pain Allergy/Immunology: neg Neurological: neg Psychiatric: neg Sleep : Restless legs   ALLERGIES: Allergies  Allergen Reactions  . Bactericin [Bacitracin]   . Iohexol      Code: HIVES, Desc: pt was premedicated/hives reaction occurred 24 hours post injection, Onset Date: [id number]   . Ivp Dye [Iodinated Diagnostic Agents]   . Sulfa Antibiotics Swelling and Rash    HOME MEDICATIONS: Outpatient Medications Prior to Visit  Medication Sig Dispense Refill  . atenolol (TENORMIN) 25 MG tablet TAKE 1 TABLET BY MOUTH  DAILY 90 tablet 0  . clobetasol (TEMOVATE) 0.05 % external solution Apply 1 application topically daily as needed (rash).     Kyle [name] ON 07/23/2016] diclofenac (VOLTAREN) 75 MG EC tablet TAKE 1 TABLET BY MOUTH TWO  TIMES DAILY 180 tablet 0  . fish oil-omega-3 fatty acids 1000 MG capsule Take 1 g by mouth 3 (three) times daily.    Marland Kitchen gabapentin (NEURONTIN) 400 MG capsule Take 1 capsule (400 mg total) by mouth at bedtime. 90 capsule 3  . Garlic (GARLIQUE PO) Take 1 tablet by mouth daily.    Marland Kitchen levETIRAcetam (KEPPRA) 500 MG tablet TAKE 1/2 TABLET BY MOUTH IN THE MORNING AND 1 TABLET IN THE EVENING 135 tablet 3  . niacinamide 500 MG tablet Take 500 mg by mouth 3 (three) times daily.    Kyle [name] ON 09/05/2016] pyridostigmine (MESTINON) 60 MG  tablet TAKE 1 TABLET BY MOUTH 3  TIMES DAILY 270 tablet 0   No facility-administered medications prior to visit.     PAST MEDICAL HISTORY: Past Medical History:  Diagnosis Date  . Arthritis   . Auto immune neutropenia (HCC)   . Bullous pemphigoid   . Cancer [facility])    Prostate  . Cervical spondylosis without myelopathy 01/22/2016  . DVT (deep venous thrombosis) ([place])    a. Has had a previous right lower extremity  DVT in the setting of hospitalization and surgery (after his brain tumor was resected in 1993) but is no longer on Coumadin.  Marland Kitchen Dyslipidemia   . History of prostate cancer   . Hypertension   . Meningioma ([place])    a. s/p resection 1990s.  . Myasthenia ([place]) 02/14/2013  . Nocturnal leg cramps   . Ocular myasthenia gravis ([place])   . Seizures ([place])   . Sinus bradycardia   . Sleep paralysis   . Thyroid nodule, cold     PAST SURGICAL HISTORY: Past Surgical History:  Procedure Laterality Date  . BRAIN SURGERY    . CHOLECYSTECTOMY    . COLONOSCOPY N/A 07/25/2013   Procedure: COLONOSCOPY;  Surgeon: Jamesetta So, MD;  Location[facility];  Service: Gastroenterology;  Laterality: N/A;  . RADIOACTIVE SEED IMPLANT      FAMILY HISTORY: Family History  Problem Relation Age of Onset  . Heart failure Mother   . Diabetes Mother   . Diabetes Sister   . Lung cancer Son   . Heart failure Daughter   . Colon cancer Neg Hx     SOCIAL HISTORY: Social History   Social History  . Marital status: Married    Spouse name: N/A  . Number of children: 1  . Years of education: 76 [place]   Occupational History  . retired    Social History Main Topics  . Smoking status: Never Smoker  . Smokeless tobacco: Never Used  . Alcohol use No  . Drug use: No  . Sexual activity: Not on file   Other Topics Concern  . Not on file   Social History Narrative   Patient is right handed.   Patient drinks 1 cup of coffee daily.     PHYSICAL EXAM  Vitals:   07/22/16 0720  BP: (!) 144/78  Pulse: (!) 48  Resp: 18  Weight: 176 lb (79.8 kg)  Height: 5\' 8"  (1.727 m)   Body mass index is 26.76 kg/m.  Generalized: Well developed, in no acute distress  Head: normocephalic and atraumatic,. Oropharynx benign  Neck: Supple, No decreased range of motion Cardiac: Regular rate rhythm, no murmur  Musculoskeletal: No deformity   Neurological examination   Mentation: Alert oriented to time, place, history  taking. Attention span and concentration appropriate. Recent and remote memory intact.  Follows all commands speech and language fluent.   Cranial nerve II-XII: .Pupils were equal round reactive to light extraocular movements were full, visual field were full on confrontational test. No ptosis Facial sensation and strength were normal. hearing was intact to finger rubbing bilaterally. Uvula tongue midline. head turning and shoulder shrug were normal and symmetric.Tongue protrusion into cheek strength was normal. Motor: normal bulk and tone, full strength in the BUE, BLE, fine finger movements normal, no pronator drift. No focal weakness Sensory: normal and symmetric to light touch, pinprick, and  Vibration, in the upper and lower extremities Coordination: finger-nose-finger, heel-to-shin bilaterally, no dysmetria Reflexes: Symmetric upper and lower,  plantar  responses were flexor bilaterally. Gait and Station: Rising up from seated position without assistance, normal stance,  moderate stride, good arm swing, smooth turning, able to perform tiptoe, and heel walking without difficulty. Tandem gait is mildly unsteady   DIAGNOSTIC DATA (LABS, IMAGING, TESTING) - I reviewed patient records, labs, notes, testing and imaging myself where available.     ASSESSMENT AND PLAN  82 y.o. year old male  has a past medical history of Arthritis;  Meningioma ([place]); (02/14/2013); Nocturnal leg cramps; Ocular myasthenia gravis ([place]); Seizures ([place]); Sinus bradycardia; and cervical spondylosis  Here to follow-up.  PLAN: Continue Keppra as directed for seizure disorder Continue gabapentin for restless legs Continue Mestinon for myasthenia gravis Given a copy of neck exercises Follow-up in 6 months I spent 25 minutes in total face to face time with the patient more than 50% of which was spent counseling and coordination of care, reviewing test results reviewing medications and discussing and reviewing the diagnosis  of myasthenia gravis, restless legs, seizure disorder and cervical spondylosis. Given a copy of neck exercises to perform Dennie Bible, [facility], [facility], APRN  [facility] [address], [place], [place] [address] [phone]

## 2016-07-27 ENCOUNTER — Ambulatory Visit: Payer: Medicare Other | Admitting: "Endocrinology

## 2016-07-29 ENCOUNTER — Encounter: Payer: Self-pay | Admitting: "Endocrinology

## 2016-07-29 ENCOUNTER — Ambulatory Visit (INDEPENDENT_AMBULATORY_CARE_PROVIDER_SITE_OTHER): Payer: Medicare Other | Admitting: "Endocrinology

## 2016-07-29 VITALS — BP 146/80 | HR 61 | Ht 68.0 in | Wt 174.0 lb

## 2016-07-29 DIAGNOSIS — E042 Nontoxic multinodular goiter: Secondary | ICD-10-CM
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
## 2016-08-31 ENCOUNTER — Other Ambulatory Visit: Payer: Self-pay | Admitting: *Deleted

## 2016-08-31 ENCOUNTER — Other Ambulatory Visit: Payer: Self-pay | Admitting: Neurology

## 2016-08-31 MED ORDER — PYRIDOSTIGMINE BROMIDE 60 MG PO TABS: 60.0000 mg | ORAL_TABLET | Freq: Three times a day (TID) | ORAL | 3 refills | Status: DC

## 2016-09-14 ENCOUNTER — Encounter (INDEPENDENT_AMBULATORY_CARE_PROVIDER_SITE_OTHER): Payer: Self-pay

## 2016-09-14 ENCOUNTER — Encounter: Payer: Self-pay | Admitting: Neurology

## 2016-09-14 ENCOUNTER — Ambulatory Visit (INDEPENDENT_AMBULATORY_CARE_PROVIDER_SITE_OTHER): Payer: Medicare Other | Admitting: Neurology

## 2016-09-14 VITALS — BP 168/81 | HR 44 | Ht 68.0 in | Wt 172.0 lb

## 2016-09-14 DIAGNOSIS — M47812 Spondylosis without myelopathy or radiculopathy, cervical region: Secondary | ICD-10-CM

## 2016-09-14 DIAGNOSIS — R569 Unspecified convulsions: Secondary | ICD-10-CM | POA: Diagnosis not present

## 2016-09-14 DIAGNOSIS — G7000 Myasthenia gravis without (acute) exacerbation: Secondary | ICD-10-CM | POA: Diagnosis not present

## 2016-09-14 MED ORDER — PYRIDOSTIGMINE BROMIDE 60 MG PO TABS: 90.0000 mg | ORAL_TABLET | Freq: Three times a day (TID) | ORAL | 3 refills | Status: DC

## 2016-09-14 NOTE — Patient Instructions (Signed)
   We will go up on the mestinon 60 mg tablets taking 1.5 tablets three times a day.

## 2016-09-14 NOTE — Progress Notes (Signed)
Reason for visit: Myasthenia gravis  Kyle [name]. is an 82 y.o. male  History of present illness:  Kyle [name] is a 82 year old right-handed white male with a history of myasthenia gravis, cervical arthritis, and a history of seizures. The patient has done well with his seizures, he has not had any recurrence while taking Keppra. The patient has noted an increase in double vision and some occasional droopiness of the right eyelid over the last one month. If he closes one eye, he will have clearing of vision. The patient denies any problems with chewing or swallowing, he denies any change in speech, he denies a weakness of the extremities. He is on Mestinon taking 60 mg 3 times daily. He is tolerating the medication well. He has been doing some exercises for the cervical spine which he finds have been somewhat helpful. The patient returns to the office today for an evaluation.  Past Medical History:  Diagnosis Date  . Arthritis   . Auto immune neutropenia (HCC)   . Bullous pemphigoid   . Cancer [facility])    Prostate  . Cervical spondylosis without myelopathy 01/22/2016  . DVT (deep venous thrombosis) ([place])    a. Has had a previous right lower extremity DVT in the setting of hospitalization and surgery (after his brain tumor was resected in 1993) but is no longer on Coumadin.  Marland Kitchen Dyslipidemia   . History of prostate cancer   . Hypertension   . Meningioma ([place])    a. s/p resection 1990s.  . Myasthenia ([place]) 02/14/2013  . Nocturnal leg cramps   . Ocular myasthenia gravis ([place])   . Seizures ([place])   . Sinus bradycardia   . Sleep paralysis   . Thyroid nodule, cold     Past Surgical History:  Procedure Laterality Date  . BRAIN SURGERY    . CHOLECYSTECTOMY    . COLONOSCOPY N/A 07/25/2013   Procedure: COLONOSCOPY;  Surgeon: Jamesetta So, MD;  Location[facility];  Service: Gastroenterology;  Laterality: N/A;  . RADIOACTIVE SEED IMPLANT      Family History  Problem Relation  Age of Onset  . Heart failure Mother   . Diabetes Mother   . Diabetes Sister   . Lung cancer Son   . Heart failure Daughter   . Colon cancer Neg Hx     Social history:  reports that he has never smoked. He has never used smokeless tobacco. He reports that he does not drink alcohol or use drugs.    Allergies  Allergen Reactions  . Bactericin [Bacitracin]   . Iohexol      Code: HIVES, Desc: pt was premedicated/hives reaction occurred 24 hours post injection, Onset Date: 38182993   . Ivp Dye [Iodinated Diagnostic Agents]   . Sulfa Antibiotics Swelling and Rash    Medications:  Prior to Admission medications   Medication Sig Start Date End Date Taking? Authorizing Provider  atenolol (TENORMIN) 25 MG tablet TAKE 1 TABLET BY MOUTH  DAILY 09/01/16  Yes Kathrynn Ducking, MD  clobetasol (TEMOVATE) 0.05 % external solution Apply 1 application topically daily as needed (rash).  11/17/12  Yes [provider]  diclofenac (VOLTAREN) 75 MG EC tablet TAKE 1 TABLET BY MOUTH TWO  TIMES DAILY 07/23/16  Yes Kathrynn Ducking, MD  fish oil-omega-3 fatty acids 1000 MG capsule Take 1 g by mouth 3 (three) times daily.   Yes [provider]  gabapentin (NEURONTIN) 400 MG capsule Take 1 capsule (  400 mg total) by mouth at bedtime. 05/18/16  Yes Kathrynn Ducking, MD  Garlic (GARLIQUE PO) Take 1 tablet by mouth daily.   Yes [provider]  levETIRAcetam (KEPPRA) 500 MG tablet TAKE 1/2 TABLET BY MOUTH IN THE MORNING AND 1 TABLET IN THE EVENING 01/23/16  Yes Kathrynn Ducking, MD  losartan (COZAAR) 25 MG tablet Take 25 mg by mouth daily.  06/02/16  Yes [provider]  MAGNESIUM PO Take 800 mg by mouth daily.    Yes [provider]  niacinamide 500 MG tablet Take 500 mg by mouth 3 (three) times daily.   Yes [provider]  pyridostigmine (MESTINON) 60 MG tablet Take 1 tablet (60 mg total) by mouth 3 (three) times daily. 08/31/16  Yes Dennie Bible, NP    simvastatin (ZOCOR) 20 MG tablet  06/02/16  Yes [provider]    ROS:  Out of a complete 14 system review of symptoms, the patient complains only of the following symptoms, and all other reviewed systems are negative.  Double vision, loss of vision Muscle cramps, neck pain Bruising easily  Blood pressure (!) 168/81, pulse (!) 44, height 5\' 8"  (1.727 m), weight 172 lb (78 kg).  Physical Exam  General: The patient is alert and cooperative at the time of the examination.  Skin: No significant peripheral edema is noted.   Neurologic Exam  Mental status: The patient is alert and oriented x 3 at the time of the examination. The patient has apparent normal recent and remote memory, with an apparently normal attention span and concentration ability.   Cranial nerves: Facial symmetry is present. Speech is normal, no aphasia or dysarthria is noted. Extraocular movements are full. Visual fields are full. With superior deviation of eyes for 1 minute, no divergence of gaze or subjective double vision was noted, no ptosis was noted.  Motor: The patient has good strength in all 4 extremities. With arms outstretched for 1 minute, no fatigable weakness of the deltoid muscles was seen.  Sensory examination: Soft touch sensation is symmetric on the face, arms, and legs.  Coordination: The patient has good finger-nose-finger and heel-to-shin bilaterally.  Gait and station: The patient has a normal gait. Tandem gait is unsteady. Romberg is negative. No drift is seen.  Reflexes: Deep tendon reflexes are symmetric.   Assessment/Plan:  1. Myasthenia gravis with mild exacerbation  2. Cervical spondylosis  3. History of seizures  The patient has done well with seizure control, he is doing somewhat better with the cervical arthritis pain. The patient is having some increased problems with double vision and ptosis likely associated with the myasthenia gravis. We will go up on the  Mestinon taking 90 mg 3 times daily. If the patient continues to have symptoms, we will add low-dose prednisone within the next 2 weeks, he will call our office if this is the case. He will follow-up for his next scheduled appointment.  Jill Alexanders MD 09/14/2016 9:11 AM  [facility] [address] [place], [place] [address]  Phone [phone] Fax [phone]

## 2016-09-24 ENCOUNTER — Telehealth: Payer: Self-pay | Admitting: Neurology

## 2016-09-24 MED ORDER — PREDNISONE 5 MG PO TABS: 10.0000 mg | ORAL_TABLET | Freq: Every day | ORAL | 3 refills | Status: DC

## 2016-09-24 NOTE — Telephone Encounter (Signed)
I called the patient. The patient still having some double vision and droopy eyelids. I talk with the wife, I will start prednisone 10 mg daily. A prescription was sent in.

## 2016-09-30 ENCOUNTER — Ambulatory Visit: Payer: Medicare Other | Admitting: Nurse Practitioner

## 2016-10-19 ENCOUNTER — Other Ambulatory Visit: Payer: Self-pay | Admitting: Neurology

## 2016-11-03 ENCOUNTER — Ambulatory Visit (INDEPENDENT_AMBULATORY_CARE_PROVIDER_SITE_OTHER): Payer: Medicare Other | Admitting: General Surgery

## 2016-11-03 ENCOUNTER — Encounter: Payer: Self-pay | Admitting: General Surgery

## 2016-11-03 VITALS — BP 152/70 | HR 46 | Temp 97.1°F | Resp 18 | Ht 68.0 in | Wt 170.0 lb

## 2016-11-03 DIAGNOSIS — K921 Melena: Secondary | ICD-10-CM | POA: Diagnosis not present

## 2016-11-03 MED ORDER — PEG 3350-KCL-NABCB-NACL-NASULF 236 G PO SOLR: 4000.0000 mL | Freq: Once | ORAL | 0 refills | Status: AC

## 2016-11-03 NOTE — H&P (Signed)
Sajid Ruppert.; [id number]; [birth year]   HPI Patient is a 82 year old white male who was referred to my care by Dr. Gerarda Fraction for evaluation and treatment of blood per rectum. He states he has been having blood per rectum over the past few months, though it seems to be getting worse. He does notice blood on the stool. He has not been lightheaded. He did have a colonoscopy in 2015 which showed sigmoid diverticulosis. Does have prostate cancer with radiation seeds present. He does have additional cramping when he moves his bowels. He denies any fever, chills, or diarrhea. He states sometimes the caliber of his stools have decreased. There is no family history of colon cancer. He currently has no pain. Past Medical History:  Diagnosis Date  . Arthritis   . Auto immune neutropenia (HCC)   . Bullous pemphigoid   . Cancer [facility])    Prostate  . Cervical spondylosis without myelopathy 01/22/2016  . DVT (deep venous thrombosis) ([place])    a. Has had a previous right lower extremity DVT in the setting of hospitalization and surgery (after his brain tumor was resected in 1993) but is no longer on Coumadin.  Marland Kitchen Dyslipidemia   . History of prostate cancer   . Hypertension   . Meningioma ([place])    a. s/p resection 1990s.  . Myasthenia ([place]) 02/14/2013  . Nocturnal leg cramps   . Ocular myasthenia gravis ([place])   . Seizures ([place])   . Sinus bradycardia   . Sleep paralysis   . Thyroid nodule, cold     Past Surgical History:  Procedure Laterality Date  . BRAIN SURGERY    . CHOLECYSTECTOMY    . COLONOSCOPY N/A 07/25/2013   Procedure: COLONOSCOPY;  Surgeon: Jamesetta So, MD;  Location[facility];  Service: Gastroenterology;  Laterality: N/A;  . RADIOACTIVE SEED IMPLANT      Family History  Problem Relation Age of Onset  . Heart failure Mother   . Diabetes Mother   . Diabetes Sister   . Lung cancer Son   . Heart failure Daughter   . Colon cancer Neg Hx     Current Outpatient Prescriptions on  File Prior to Visit  Medication Sig Dispense Refill  . atenolol (TENORMIN) 25 MG tablet TAKE 1 TABLET BY MOUTH  DAILY 90 tablet 3  . clobetasol (TEMOVATE) 0.05 % external solution Apply 1 application topically daily as needed (rash).     . diclofenac (VOLTAREN) 75 MG EC tablet TAKE 1 TABLET BY MOUTH TWO  TIMES DAILY 180 tablet 1  . fish oil-omega-3 fatty acids 1000 MG capsule Take 1 g by mouth 3 (three) times daily.    Marland Kitchen gabapentin (NEURONTIN) 400 MG capsule Take 1 capsule (400 mg total) by mouth at bedtime. 90 capsule 3  . Garlic (GARLIQUE PO) Take 1 tablet by mouth daily.    Marland Kitchen levETIRAcetam (KEPPRA) 500 MG tablet TAKE 1/2 TABLET BY MOUTH IN THE MORNING AND 1 TABLET IN THE EVENING 135 tablet 3  . losartan (COZAAR) 25 MG tablet Take 25 mg by mouth daily.     Marland Kitchen MAGNESIUM PO Take 800 mg by mouth daily.     . niacinamide 500 MG tablet Take 500 mg by mouth 3 (three) times daily.    . predniSONE (DELTASONE) 5 MG tablet Take 2 tablets (10 mg total) by mouth daily with breakfast. 60 tablet 3  . pyridostigmine (MESTINON) 60 MG tablet Take 1.5 tablets (90 mg total) by mouth 3 (three)  times daily. 270 tablet 3  . simvastatin (ZOCOR) 20 MG tablet      No current facility-administered medications on file prior to visit.     Allergies  Allergen Reactions  . Bactericin [Bacitracin]   . Iohexol      Code: HIVES, Desc: pt was premedicated/hives reaction occurred 24 hours post injection, Onset Date: 88416606   . Ivp Dye [Iodinated Diagnostic Agents]   . Sulfa Antibiotics Swelling and Rash    History  Alcohol Use No    History  Smoking Status  . Never Smoker  Smokeless Tobacco  . Never Used    Review of Systems  Constitutional: Negative.   HENT: Negative.   Eyes: Negative.   Respiratory: Negative.   Cardiovascular: Negative.   Gastrointestinal: Positive for abdominal pain.  Genitourinary: Negative.   Musculoskeletal: Positive for neck pain.  Skin: Negative.   Neurological: Negative.    Endo/Heme/Allergies: Negative.   Psychiatric/Behavioral: Negative.     Objective   Vitals:   11/03/16 0915  BP: (!) 152/70  Pulse: (!) 46  Resp: 18  Temp: (!) 97.1 F (36.2 C)    Physical Exam  Constitutional: He is oriented to person, place, and time and well-developed, well-nourished, and in no distress.  HENT:  Head: Normocephalic and atraumatic.  Neck: Normal range of motion. Neck supple.  Cardiovascular: Normal rate, regular rhythm and normal heart sounds.   No murmur heard. Pulmonary/Chest: Effort normal and breath sounds normal. He has no wheezes. He has no rales.  Abdominal: Soft. Bowel sounds are normal. He exhibits no distension. There is no tenderness.  Neurological: He is alert and oriented to person, place, and time.  Skin: Skin is warm and dry.  Vitals reviewed.   Assessment  Blood per rectum Plan   Patient is scheduled for a colonoscopy on 11/10/2016. The risks and benefits of the procedure were fully explained to the patient, who gave informed consent.

## 2016-11-03 NOTE — Patient Instructions (Signed)
Colonoscopy, Adult A colonoscopy is an exam to look at the entire large intestine. During the exam, a lubricated, bendable tube is inserted into the anus and then passed into the rectum, colon, and other parts of the large intestine. A colonoscopy is often done as a part of normal colorectal screening or in response to certain symptoms, such as anemia, persistent diarrhea, abdominal pain, and blood in the stool. The exam can help screen for and diagnose medical problems, including:  Tumors.  Polyps.  Inflammation.  Areas of bleeding.  Tell a health care provider about:  Any allergies you have.  All medicines you are taking, including vitamins, herbs, eye drops, creams, and over-the-counter medicines.  Any problems you or family members have had with anesthetic medicines.  Any blood disorders you have.  Any surgeries you have had.  Any medical conditions you have.  Any problems you have had passing stool. What are the risks? Generally, this is a safe procedure. However, problems may occur, including:  Bleeding.  A tear in the intestine.  A reaction to medicines given during the exam.  Infection (rare).  What happens before the procedure? Eating and drinking restrictions Follow instructions from your health care provider about eating and drinking, which may include:  A few days before the procedure - follow a low-fiber diet. Avoid nuts, seeds, dried fruit, raw fruits, and vegetables.  1-3 days before the procedure - follow a clear liquid diet. Drink only clear liquids, such as clear broth or bouillon, black coffee or tea, clear juice, clear soft drinks or sports drinks, gelatin dessert, and popsicles. Avoid any liquids that contain red or purple dye.  On the day of the procedure - do not eat or drink anything during the 2 hours before the procedure, or within the time period that your health care provider recommends.  Bowel prep If you were prescribed an oral bowel prep  to clean out your colon:  Take it as told by your health care provider. Starting the day before your procedure, you will need to drink a large amount of medicated liquid. The liquid will cause you to have multiple loose stools until your stool is almost clear or light green.  If your skin or anus gets irritated from diarrhea, you may use these to relieve the irritation: ? Medicated wipes, such as adult wet wipes with aloe and vitamin E. ? A skin soothing-product like petroleum jelly.  If you vomit while drinking the bowel prep, take a break for up to 60 minutes and then begin the bowel prep again. If vomiting continues and you cannot take the bowel prep without vomiting, call your health care provider.  General instructions  Ask your health care provider about changing or stopping your regular medicines. This is especially important if you are taking diabetes medicines or blood thinners.  Plan to have someone take you home from the hospital or clinic. What happens during the procedure?  An IV tube may be inserted into one of your veins.  You will be given medicine to help you relax (sedative).  To reduce your risk of infection: ? Your health care team will wash or sanitize their hands. ? Your anal area will be washed with soap.  You will be asked to lie on your side with your knees bent.  Your health care provider will lubricate a long, thin, flexible tube. The tube will have a camera and a light on the end.  The tube will be inserted into your   anus.  The tube will be gently eased through your rectum and colon.  Air will be delivered into your colon to keep it open. You may feel some pressure or cramping.  The camera will be used to take images during the procedure.  A small tissue sample may be removed from your body to be examined under a microscope (biopsy). If any potential problems are found, the tissue will be sent to a lab for testing.  If small polyps are found, your  health care provider may remove them and have them checked for cancer cells.  The tube that was inserted into your anus will be slowly removed. The procedure may vary among health care providers and hospitals. What happens after the procedure?  Your blood pressure, heart rate, breathing rate, and blood oxygen level will be monitored until the medicines you were given have worn off.  Do not drive for 24 hours after the exam.  You may have a small amount of blood in your stool.  You may pass gas and have mild abdominal cramping or bloating due to the air that was used to inflate your colon during the exam.  It is up to you to get the results of your procedure. Ask your health care provider, or the department performing the procedure, when your results will be ready. This information is not intended to replace advice given to you by your health care provider. Make sure you discuss any questions you have with your health care provider. Document Released: 03/27/2000 Document Revised: 01/29/2016 Document Reviewed: 06/11/2015 Elsevier Interactive Patient Education  2018 [employer]  

## 2016-11-03 NOTE — Progress Notes (Signed)
Kyle [name].; [id number]; [birth year]   HPI Patient is a 82 year old white male who was referred to my care by Dr. Gerarda Fraction for evaluation and treatment of blood per rectum. He states he has been having blood per rectum over the past few months, though it seems to be getting worse. He does notice blood on the stool. He has not been lightheaded. He did have a colonoscopy in 2015 which showed sigmoid diverticulosis. Does have prostate cancer with radiation seeds present. He does have additional cramping when he moves his bowels. He denies any fever, chills, or diarrhea. He states sometimes the caliber of his stools have decreased. There is no family history of colon cancer. He currently has no pain. Past Medical History:  Diagnosis Date  . Arthritis   . Auto immune neutropenia (HCC)   . Bullous pemphigoid   . Cancer [facility])    Prostate  . Cervical spondylosis without myelopathy 01/22/2016  . DVT (deep venous thrombosis) ([place])    a. Has had a previous right lower extremity DVT in the setting of hospitalization and surgery (after his brain tumor was resected in 1993) but is no longer on Coumadin.  Marland Kitchen Dyslipidemia   . History of prostate cancer   . Hypertension   . Meningioma ([place])    a. s/p resection 1990s.  . Myasthenia ([place]) 02/14/2013  . Nocturnal leg cramps   . Ocular myasthenia gravis ([place])   . Seizures ([place])   . Sinus bradycardia   . Sleep paralysis   . Thyroid nodule, cold     Past Surgical History:  Procedure Laterality Date  . BRAIN SURGERY    . CHOLECYSTECTOMY    . COLONOSCOPY N/A 07/25/2013   Procedure: COLONOSCOPY;  Surgeon: Jamesetta So, MD;  Location: [facility];  Service: Gastroenterology;  Laterality: N/A;  . RADIOACTIVE SEED IMPLANT      Family History  Problem Relation Age of Onset  . Heart failure Mother   . Diabetes Mother   . Diabetes Sister   . Lung cancer Son   . Heart failure Daughter   . Colon cancer Neg Hx     Current Outpatient Prescriptions on  File Prior to Visit  Medication Sig Dispense Refill  . atenolol (TENORMIN) 25 MG tablet TAKE 1 TABLET BY MOUTH  DAILY 90 tablet 3  . clobetasol (TEMOVATE) 0.05 % external solution Apply 1 application topically daily as needed (rash).     . diclofenac (VOLTAREN) 75 MG EC tablet TAKE 1 TABLET BY MOUTH TWO  TIMES DAILY 180 tablet 1  . fish oil-omega-3 fatty acids 1000 MG capsule Take 1 g by mouth 3 (three) times daily.    Marland Kitchen gabapentin (NEURONTIN) 400 MG capsule Take 1 capsule (400 mg total) by mouth at bedtime. 90 capsule 3  . Garlic (GARLIQUE PO) Take 1 tablet by mouth daily.    Marland Kitchen levETIRAcetam (KEPPRA) 500 MG tablet TAKE 1/2 TABLET BY MOUTH IN THE MORNING AND 1 TABLET IN THE EVENING 135 tablet 3  . losartan (COZAAR) 25 MG tablet Take 25 mg by mouth daily.     Marland Kitchen MAGNESIUM PO Take 800 mg by mouth daily.     . niacinamide 500 MG tablet Take 500 mg by mouth 3 (three) times daily.    . predniSONE (DELTASONE) 5 MG tablet Take 2 tablets (10 mg total) by mouth daily with breakfast. 60 tablet 3  . pyridostigmine (MESTINON) 60 MG tablet Take 1.5 tablets (90 mg total) by mouth 3 (three)  times daily. 270 tablet 3  . simvastatin (ZOCOR) 20 MG tablet      No current facility-administered medications on file prior to visit.     Allergies  Allergen Reactions  . Bactericin [Bacitracin]   . Iohexol      Code: HIVES, Desc: pt was premedicated/hives reaction occurred 24 hours post injection, Onset Date: [id number]   . Ivp Dye [Iodinated Diagnostic Agents]   . Sulfa Antibiotics Swelling and Rash    History  Alcohol Use No    History  Smoking Status  . Never Smoker  Smokeless Tobacco  . Never Used    Review of Systems  Constitutional: Negative.   HENT: Negative.   Eyes: Negative.   Respiratory: Negative.   Cardiovascular: Negative.   Gastrointestinal: Positive for abdominal pain.  Genitourinary: Negative.   Musculoskeletal: Positive for neck pain.  Skin: Negative.   Neurological: Negative.    Endo/Heme/Allergies: Negative.   Psychiatric/Behavioral: Negative.     Objective   Vitals:   11/03/16 0915  BP: (!) 152/70  Pulse: (!) 46  Resp: 18  Temp: (!) 97.1 F (36.2 C)    Physical Exam  Constitutional: He is oriented to person, place, and time and well-developed, well-nourished, and in no distress.  HENT:  Head: Normocephalic and atraumatic.  Neck: Normal range of motion. Neck supple.  Cardiovascular: Normal rate, regular rhythm and normal heart sounds.   No murmur heard. Pulmonary/Chest: Effort normal and breath sounds normal. He has no wheezes. He has no rales.  Abdominal: Soft. Bowel sounds are normal. He exhibits no distension. There is no tenderness.  Neurological: He is alert and oriented to person, place, and time.  Skin: Skin is warm and dry.  Vitals reviewed.   Assessment  Blood per rectum Plan   Patient is scheduled for a colonoscopy on 11/10/2016. The risks and benefits of the procedure were fully explained to the patient, who gave informed consent.

## 2016-11-10 ENCOUNTER — Encounter (HOSPITAL_COMMUNITY)
Admission: RE | Disposition: A | Discharge status: Home Health | Payer: Self-pay | Source: Ambulatory Visit | Attending: General Surgery

## 2016-11-10 ENCOUNTER — Encounter (HOSPITAL_COMMUNITY): Payer: Self-pay

## 2016-11-10 ENCOUNTER — Ambulatory Visit (HOSPITAL_COMMUNITY)
Admission: RE | Admit: 2016-11-10 | Discharge: 2016-11-10 | Disposition: A | Discharge status: Home Health | Payer: Medicare Other | Source: Ambulatory Visit | Attending: General Surgery | Admitting: General Surgery

## 2016-11-10 DIAGNOSIS — M1990 Unspecified osteoarthritis, unspecified site: Secondary | ICD-10-CM | POA: Insufficient documentation

## 2016-11-10 DIAGNOSIS — K5730 Diverticulosis of large intestine without perforation or abscess without bleeding: Secondary | ICD-10-CM | POA: Diagnosis not present

## 2016-11-10 DIAGNOSIS — Z86718 Personal history of other venous thrombosis and embolism: Secondary | ICD-10-CM | POA: Insufficient documentation

## 2016-11-10 DIAGNOSIS — Z79899 Other long term (current) drug therapy: Secondary | ICD-10-CM | POA: Insufficient documentation

## 2016-11-10 DIAGNOSIS — I10 Essential (primary) hypertension: Secondary | ICD-10-CM | POA: Insufficient documentation

## 2016-11-10 DIAGNOSIS — E785 Hyperlipidemia, unspecified: Secondary | ICD-10-CM | POA: Insufficient documentation

## 2016-11-10 DIAGNOSIS — M47812 Spondylosis without myelopathy or radiculopathy, cervical region: Secondary | ICD-10-CM | POA: Insufficient documentation

## 2016-11-10 DIAGNOSIS — K641 Second degree hemorrhoids: Secondary | ICD-10-CM | POA: Diagnosis not present

## 2016-11-10 DIAGNOSIS — Z7901 Long term (current) use of anticoagulants: Secondary | ICD-10-CM | POA: Diagnosis not present

## 2016-11-10 DIAGNOSIS — K921 Melena: Secondary | ICD-10-CM | POA: Diagnosis not present

## 2016-11-10 DIAGNOSIS — G7000 Myasthenia gravis without (acute) exacerbation: Secondary | ICD-10-CM | POA: Diagnosis not present

## 2016-11-10 DIAGNOSIS — Z8546 Personal history of malignant neoplasm of prostate: Secondary | ICD-10-CM | POA: Insufficient documentation

## 2016-11-10 HISTORY — PX: COLONOSCOPY: SHX5424

## 2016-11-10 SURGERY — COLONOSCOPY
Anesthesia: Moderate Sedation

## 2016-11-10 MED ORDER — MIDAZOLAM HCL 5 MG/5ML IJ SOLN
INTRAMUSCULAR | Status: AC
  Filled 2016-11-10: qty 5

## 2016-11-10 MED ORDER — MIDAZOLAM HCL 5 MG/5ML IJ SOLN
INTRAMUSCULAR | Status: DC | PRN
  Administered 2016-11-10: 1 mg via INTRAVENOUS
  Administered 2016-11-10: 2 mg via INTRAVENOUS

## 2016-11-10 MED ORDER — STERILE WATER FOR IRRIGATION IR SOLN
Status: DC | PRN
  Administered 2016-11-10: 08:00:00

## 2016-11-10 MED ORDER — MEPERIDINE HCL 50 MG/ML IJ SOLN
INTRAMUSCULAR | Status: AC
  Filled 2016-11-10: qty 1

## 2016-11-10 MED ORDER — MEPERIDINE HCL 50 MG/ML IJ SOLN
INTRAMUSCULAR | Status: DC | PRN
  Administered 2016-11-10: 50 mg via INTRAVENOUS

## 2016-11-10 MED ORDER — SODIUM CHLORIDE 0.9 % IV SOLN
INTRAVENOUS | Status: DC
  Administered 2016-11-10: 07:00:00 via INTRAVENOUS

## 2016-11-10 NOTE — Interval H&P Note (Signed)
History and Physical Interval Note:  11/10/2016 7:51 AM  Kyle [name].  has presented today for surgery, with the diagnosis of blood in stool  The various methods of treatment have been discussed with the patient and family. After consideration of risks, benefits and other options for treatment, the patient has consented to  Procedure(s): COLONOSCOPY (N/A) as a surgical intervention .  The patient's history has been reviewed, patient examined, no change in status, stable for surgery.  I have reviewed the patient's chart and labs.  Questions were answered to the patient's satisfaction.     Aviva Signs

## 2016-11-10 NOTE — Discharge Instructions (Signed)
Colonoscopy, Adult, Care After  This sheet gives you information about how to care for yourself after your procedure. Your health care provider may also give you more specific instructions. If you have problems or questions, contact your health care provider.  What can I expect after the procedure?  After the procedure, it is common to have:  · A small amount of blood in your stool for 24 hours after the procedure.  · Some gas.  · Mild abdominal cramping or bloating.    Follow these instructions at home:  General instructions    · For the first 24 hours after the procedure:  ? Do not drive or use machinery.  ? Do not sign important documents.  ? Do not drink alcohol.  ? Do your regular daily activities at a slower pace than normal.  ? Eat soft, easy-to-digest foods.  ? Rest often.  · Take over-the-counter or prescription medicines only as told by your health care provider.  · It is up to you to get the results of your procedure. Ask your health care provider, or the department performing the procedure, when your results will be ready.  Relieving cramping and bloating  · Try walking around when you have cramps or feel bloated.  · Apply heat to your abdomen as told by your health care provider. Use a heat source that your health care provider recommends, such as a moist heat pack or a heating pad.  ? Place a towel between your skin and the heat source.  ? Leave the heat on for 20-30 minutes.  ? Remove the heat if your skin turns bright red. This is especially important if you are unable to feel pain, heat, or cold. You may have a greater risk of getting burned.  Eating and drinking  · Drink enough fluid to keep your urine clear or pale yellow.  · Resume your normal diet as instructed by your health care provider. Avoid heavy or fried foods that are hard to digest.  · Avoid drinking alcohol for as long as instructed by your health care provider.  Contact a health care provider if:  · You have blood in your stool 2-3  days after the procedure.  Get help right away if:  · You have more than a small spotting of blood in your stool.  · You pass large blood clots in your stool.  · Your abdomen is swollen.  · You have nausea or vomiting.  · You have a fever.  · You have increasing abdominal pain that is not relieved with medicine.  This information is not intended to replace advice given to you by your health care provider. Make sure you discuss any questions you have with your health care provider.  Document Released: 11/12/2003 Document Revised: 12/23/2015 Document Reviewed: 06/11/2015  Elsevier Interactive Patient Education © 2018 [employer]

## 2016-11-10 NOTE — Op Note (Signed)
[facility] Patient Name: Kyle [name] Procedure Date: 11/10/2016 7:56 AM MRN: [id number] Date of Birth: 11-Oct-1934 Attending MD: Aviva Signs , MD CSN: [id number] Age: 82 Admit Type: Outpatient Procedure:                Colonoscopy Indications:              Hematochezia Providers:                Aviva Signs, MD, Otis Peak B. Sharon Seller, RN, Bonnetta Barry, Technician Referring MD:              Medicines:                Midazolam 3 mg IV, Meperidine 50 mg IV Complications:            No immediate complications. Estimated blood loss:                            None. Estimated Blood Loss:     Estimated blood loss: none. Procedure:                Pre-Anesthesia Assessment:                           - Prior to the procedure, a History and Physical                            was performed, and patient medications and                            allergies were reviewed. The patient is competent.                            The risks and benefits of the procedure and the                            sedation options and risks were discussed with the                            patient. All questions were answered and informed                            consent was obtained. Patient identification and                            proposed procedure were verified by the physician,                            the nurse and the technician in the endoscopy                            suite. Mental Status Examination: alert and  oriented. Airway Examination: normal oropharyngeal                            airway and neck mobility. Respiratory Examination:                            clear to auscultation. CV Examination: normal.                            Prophylactic Antibiotics: The patient does not                            require prophylactic antibiotics. Prior                            Anticoagulants: The patient has taken no previous                 anticoagulant or antiplatelet agents. ASA Grade                            Assessment: III - A patient with severe systemic                            disease. After reviewing the risks and benefits,                            the patient was deemed in satisfactory condition to                            undergo the procedure. The anesthesia plan was to                            use moderate sedation / analgesia (conscious                            sedation). Immediately prior to administration of                            medications, the patient was re-assessed for                            adequacy to receive sedatives. The heart rate,                            respiratory rate, oxygen saturations, blood                            pressure, adequacy of pulmonary ventilation, and                            response to care were monitored throughout the                            procedure. The physical status of the patient was  re-assessed after the procedure.                           After obtaining informed consent, the colonoscope                            was passed under direct vision. Throughout the                            procedure, the patient's blood pressure, pulse, and                            oxygen saturations were monitored continuously. The                            [id number] ([id number]) scope was introduced through                            the anus and advanced to the the cecum, identified                            by the appendiceal orifice, ileocecal valve and                            palpation. No anatomical landmarks were                            photographed. The entire colon was examined. The                            colonoscopy was performed without difficulty. The                            patient tolerated the procedure well. The quality                            of the bowel preparation was adequate.  The total                            duration of the procedure was 10 minutes. Scope In: 7:59:48 AM Scope Out: 8:08:17 AM Scope Withdrawal Time: 0 hours 2 minutes 56 seconds  Total Procedure Duration: 0 hours 8 minutes 29 seconds  Findings:      The perianal exam findings include non-thrombosed internal hemorrhoids       and internal hemorrhoids that prolapse with straining, but spontaneously       regress to the resting position (Grade II).      Multiple medium-mouthed diverticula were found in the left colon and       right colon. There was no evidence of diverticular bleeding.      The exam was otherwise without abnormality on direct and retroflexion       views. Impression:               - Non-thrombosed internal hemorrhoids and internal  hemorrhoids that prolapse with straining, but                            spontaneously regress to the resting position                            (Grade II) found on perianal exam.                           - Moderate diverticulosis in the left colon and in                            the right colon. There was no evidence of                            diverticular bleeding.                           - The examination was otherwise normal on direct                            and retroflexion views.                           - No specimens collected. Moderate Sedation:      Moderate (conscious) sedation was administered by the endoscopy nurse       and supervised by the endoscopist. The following parameters were       monitored: oxygen saturation, heart rate, blood pressure, and response       to care. Total physician intraservice time was 10 minutes. Recommendation:           - Patient has a contact number available for                            emergencies. The signs and symptoms of potential                            delayed complications were discussed with the                            patient. Return to normal  activities tomorrow.                            Written discharge instructions were provided to the                            patient.                           - Written discharge instructions were provided to                            the patient.                           - The signs and  symptoms of potential delayed                            complications were discussed with the patient.                           - Patient has a contact number available for                            emergencies.                           - Return to normal activities tomorrow.                           - Resume previous diet.                           - Continue present medications.                           - No repeat colonoscopy due to age. Procedure Code(s):        --- Professional ---                           [id number], Colonoscopy, flexible; diagnostic, including                            collection of specimen(s) by brushing or washing,                            when performed (separate procedure)                           G0500, Moderate sedation services provided by the                            same physician or other qualified health care                            professional performing a gastrointestinal                            endoscopic service that sedation supports,                            requiring the presence of an independent trained                            observer to assist in the monitoring of the                            patient's level of consciousness and physiological                            status; initial 15 minutes of intra-service time;  patient age 75 years or older (additional time may                            be reported with [phone], as appropriate) Diagnosis Code(s):        --- Professional ---                           K92.1, Melena (includes Hematochezia)                           K64.1, Second degree hemorrhoids                            K57.30, Diverticulosis of large intestine without                            perforation or abscess without bleeding CPT copyright 2016 [employer]. All rights reserved. The codes documented in this report are preliminary and upon coder review may  be revised to meet current compliance requirements. Aviva Signs, MD Aviva Signs, MD 11/10/2016 8:15:43 AM This report has been signed electronically. Number of Addenda: 0

## 2016-11-11 ENCOUNTER — Encounter (HOSPITAL_COMMUNITY): Payer: Self-pay | Admitting: General Surgery

## 2016-11-26 DIAGNOSIS — L821 Other seborrheic keratosis: Secondary | ICD-10-CM | POA: Diagnosis not present

## 2016-11-26 DIAGNOSIS — X32XXXD Exposure to sunlight, subsequent encounter: Secondary | ICD-10-CM | POA: Diagnosis not present

## 2016-11-26 DIAGNOSIS — L120 Bullous pemphigoid: Secondary | ICD-10-CM | POA: Diagnosis not present

## 2016-11-26 DIAGNOSIS — Z1283 Encounter for screening for malignant neoplasm of skin: Secondary | ICD-10-CM | POA: Diagnosis not present

## 2016-11-26 DIAGNOSIS — L570 Actinic keratosis: Secondary | ICD-10-CM | POA: Diagnosis not present

## 2016-12-15 ENCOUNTER — Emergency Department (HOSPITAL_COMMUNITY): Payer: Medicare Other

## 2016-12-15 ENCOUNTER — Inpatient Hospital Stay (HOSPITAL_COMMUNITY)
Admission: EM | Admit: 2016-12-15 | Discharge: 2016-12-19 | DRG: 392 | Disposition: A | Discharge status: Home / Self Care | Payer: Medicare Other | Attending: Internal Medicine | Admitting: Internal Medicine

## 2016-12-15 ENCOUNTER — Encounter (HOSPITAL_COMMUNITY): Payer: Self-pay | Admitting: *Deleted

## 2016-12-15 DIAGNOSIS — H9190 Unspecified hearing loss, unspecified ear: Secondary | ICD-10-CM | POA: Diagnosis not present

## 2016-12-15 DIAGNOSIS — Z8546 Personal history of malignant neoplasm of prostate: Secondary | ICD-10-CM | POA: Diagnosis not present

## 2016-12-15 DIAGNOSIS — Z8249 Family history of ischemic heart disease and other diseases of the circulatory system: Secondary | ICD-10-CM | POA: Diagnosis not present

## 2016-12-15 DIAGNOSIS — D709 Neutropenia, unspecified: Secondary | ICD-10-CM | POA: Diagnosis not present

## 2016-12-15 DIAGNOSIS — K5792 Diverticulitis of intestine, part unspecified, without perforation or abscess without bleeding: Secondary | ICD-10-CM | POA: Diagnosis not present

## 2016-12-15 DIAGNOSIS — R561 Post traumatic seizures: Secondary | ICD-10-CM | POA: Diagnosis not present

## 2016-12-15 DIAGNOSIS — Z881 Allergy status to other antibiotic agents status: Secondary | ICD-10-CM

## 2016-12-15 DIAGNOSIS — E785 Hyperlipidemia, unspecified: Secondary | ICD-10-CM | POA: Diagnosis not present

## 2016-12-15 DIAGNOSIS — R001 Bradycardia, unspecified: Secondary | ICD-10-CM | POA: Diagnosis present

## 2016-12-15 DIAGNOSIS — B9620 Unspecified Escherichia coli [E. coli] as the cause of diseases classified elsewhere: Secondary | ICD-10-CM | POA: Diagnosis present

## 2016-12-15 DIAGNOSIS — Z79899 Other long term (current) drug therapy: Secondary | ICD-10-CM

## 2016-12-15 DIAGNOSIS — Z801 Family history of malignant neoplasm of trachea, bronchus and lung: Secondary | ICD-10-CM

## 2016-12-15 DIAGNOSIS — Z91041 Radiographic dye allergy status: Secondary | ICD-10-CM

## 2016-12-15 DIAGNOSIS — N200 Calculus of kidney: Secondary | ICD-10-CM | POA: Diagnosis not present

## 2016-12-15 DIAGNOSIS — Z86011 Personal history of benign neoplasm of the brain: Secondary | ICD-10-CM

## 2016-12-15 DIAGNOSIS — Z883 Allergy status to other anti-infective agents status: Secondary | ICD-10-CM

## 2016-12-15 DIAGNOSIS — I10 Essential (primary) hypertension: Secondary | ICD-10-CM

## 2016-12-15 DIAGNOSIS — Z7952 Long term (current) use of systemic steroids: Secondary | ICD-10-CM | POA: Diagnosis not present

## 2016-12-15 DIAGNOSIS — Z9049 Acquired absence of other specified parts of digestive tract: Secondary | ICD-10-CM

## 2016-12-15 DIAGNOSIS — G40909 Epilepsy, unspecified, not intractable, without status epilepticus: Secondary | ICD-10-CM | POA: Diagnosis present

## 2016-12-15 DIAGNOSIS — K5720 Diverticulitis of large intestine with perforation and abscess without bleeding: Secondary | ICD-10-CM | POA: Diagnosis not present

## 2016-12-15 DIAGNOSIS — R569 Unspecified convulsions: Secondary | ICD-10-CM | POA: Diagnosis not present

## 2016-12-15 DIAGNOSIS — G7000 Myasthenia gravis without (acute) exacerbation: Secondary | ICD-10-CM

## 2016-12-15 DIAGNOSIS — K5732 Diverticulitis of large intestine without perforation or abscess without bleeding: Secondary | ICD-10-CM | POA: Diagnosis not present

## 2016-12-15 DIAGNOSIS — K631 Perforation of intestine (nontraumatic): Secondary | ICD-10-CM | POA: Diagnosis present

## 2016-12-15 DIAGNOSIS — Z86718 Personal history of other venous thrombosis and embolism: Secondary | ICD-10-CM | POA: Diagnosis not present

## 2016-12-15 DIAGNOSIS — K5730 Diverticulosis of large intestine without perforation or abscess without bleeding: Secondary | ICD-10-CM | POA: Diagnosis not present

## 2016-12-15 LAB — CBC WITH DIFFERENTIAL/PLATELET
BASOS ABS: 0 10*3/uL (ref 0.0–0.1)
Basophils Relative: 0 %
Eosinophils Absolute: 0 10*3/uL (ref 0.0–0.7)
Eosinophils Relative: 0 %
HEMATOCRIT: 40.7 % (ref 39.0–52.0)
HEMOGLOBIN: 13.4 g/dL (ref 13.0–17.0)
LYMPHS PCT: 4 %
Lymphs Abs: 0.8 10*3/uL (ref 0.7–4.0)
MCH: 31.5 pg (ref 26.0–34.0)
MCHC: 32.9 g/dL (ref 30.0–36.0)
MCV: 95.8 fL (ref 78.0–100.0)
MONOS PCT: 7 %
Monocytes Absolute: 1.5 10*3/uL — ABNORMAL HIGH (ref 0.1–1.0)
NEUTROS ABS: 18.8 10*3/uL — AB (ref 1.7–7.7)
Neutrophils Relative %: 89 %
Platelets: 147 10*3/uL — ABNORMAL LOW (ref 150–400)
RBC: 4.25 MIL/uL (ref 4.22–5.81)
RDW: 13.4 % (ref 11.5–15.5)
WBC MORPHOLOGY: INCREASED
WBC: 21.1 10*3/uL — AB (ref 4.0–10.5)

## 2016-12-15 LAB — URINALYSIS, ROUTINE W REFLEX MICROSCOPIC
Bacteria, UA: NONE SEEN
Bilirubin Urine: NEGATIVE
GLUCOSE, UA: 50 mg/dL — AB
Ketones, ur: NEGATIVE mg/dL
Leukocytes, UA: NEGATIVE
Nitrite: NEGATIVE
PROTEIN: 30 mg/dL — AB
SPECIFIC GRAVITY, URINE: 1.021 (ref 1.005–1.030)
pH: 5 (ref 5.0–8.0)

## 2016-12-15 LAB — COMPREHENSIVE METABOLIC PANEL
ALBUMIN: 3.5 g/dL (ref 3.5–5.0)
ALT: 64 U/L — AB (ref 17–63)
AST: 40 U/L (ref 15–41)
Alkaline Phosphatase: 68 U/L (ref 38–126)
Anion gap: 9 (ref 5–15)
BILIRUBIN TOTAL: 1.7 mg/dL — AB (ref 0.3–1.2)
BUN: 26 mg/dL — AB (ref 6–20)
CO2: 23 mmol/L (ref 22–32)
Calcium: 8.8 mg/dL — ABNORMAL LOW (ref 8.9–10.3)
Chloride: 101 mmol/L (ref 101–111)
Creatinine, Ser: 0.82 mg/dL (ref 0.61–1.24)
GFR calc Af Amer: >60 mL/min (ref >60)
GFR calc non Af Amer: >60 mL/min (ref >60)
GLUCOSE: 212 mg/dL — AB (ref 65–99)
POTASSIUM: 4.7 mmol/L (ref 3.5–5.1)
Sodium: 133 mmol/L — ABNORMAL LOW (ref 135–145)
TOTAL PROTEIN: 7.1 g/dL (ref 6.5–8.1)

## 2016-12-15 MED ORDER — SODIUM CHLORIDE 0.9 % IV SOLN
3.0000 g | Freq: Four times a day (QID) | INTRAVENOUS | Status: DC
  Administered 2016-12-15 – 2016-12-17 (×8): 3 g via INTRAVENOUS
  Filled 2016-12-15 (×12): qty 3

## 2016-12-15 MED ORDER — PREDNISONE 20 MG PO TABS
20.0000 mg | ORAL_TABLET | Freq: Every day | ORAL | Status: DC
  Administered 2016-12-16 – 2016-12-19 (×4): 20 mg via ORAL
  Filled 2016-12-15 (×4): qty 1

## 2016-12-15 MED ORDER — SIMVASTATIN 20 MG PO TABS
20.0000 mg | ORAL_TABLET | Freq: Every day | ORAL | Status: DC
  Administered 2016-12-15 – 2016-12-18 (×4): 20 mg via ORAL
  Filled 2016-12-15 (×4): qty 1

## 2016-12-15 MED ORDER — SODIUM CHLORIDE 0.9 % IV SOLN
INTRAVENOUS | Status: DC
  Administered 2016-12-15 – 2016-12-18 (×4): via INTRAVENOUS

## 2016-12-15 MED ORDER — ACETAMINOPHEN 650 MG RE SUPP: 650.0000 mg | Freq: Four times a day (QID) | RECTAL | Status: DC | PRN

## 2016-12-15 MED ORDER — PIPERACILLIN-TAZOBACTAM 3.375 G IVPB 30 MIN
3.3750 g | Freq: Once | INTRAVENOUS | Status: AC
  Administered 2016-12-15: 3.375 g via INTRAVENOUS
  Filled 2016-12-15: qty 50

## 2016-12-15 MED ORDER — HYDROCODONE-ACETAMINOPHEN 5-325 MG PO TABS: 1.0000 | ORAL_TABLET | ORAL | Status: DC | PRN

## 2016-12-15 MED ORDER — PYRIDOSTIGMINE BROMIDE 60 MG PO TABS
90.0000 mg | ORAL_TABLET | Freq: Three times a day (TID) | ORAL | Status: DC
  Administered 2016-12-15 – 2016-12-19 (×11): 90 mg via ORAL
  Filled 2016-12-15 (×18): qty 1.5

## 2016-12-15 MED ORDER — NIACINAMIDE 500 MG PO TABS: 500.0000 mg | ORAL_TABLET | Freq: Three times a day (TID) | ORAL | Status: DC

## 2016-12-15 MED ORDER — LEVETIRACETAM 250 MG PO TABS
250.0000 mg | ORAL_TABLET | Freq: Every morning | ORAL | Status: DC
  Administered 2016-12-16 – 2016-12-19 (×4): 250 mg via ORAL
  Filled 2016-12-15 (×4): qty 1

## 2016-12-15 MED ORDER — POLYVINYL ALCOHOL 1.4 % OP SOLN: 1.0000 [drp] | Freq: Three times a day (TID) | OPHTHALMIC | Status: DC | PRN

## 2016-12-15 MED ORDER — ONDANSETRON HCL 4 MG/2ML IJ SOLN: 4.0000 mg | Freq: Four times a day (QID) | INTRAMUSCULAR | Status: DC | PRN

## 2016-12-15 MED ORDER — ONDANSETRON HCL 4 MG PO TABS: 4.0000 mg | ORAL_TABLET | Freq: Four times a day (QID) | ORAL | Status: DC | PRN

## 2016-12-15 MED ORDER — GABAPENTIN 400 MG PO CAPS
400.0000 mg | ORAL_CAPSULE | Freq: Every day | ORAL | Status: DC
  Administered 2016-12-15 – 2016-12-18 (×4): 400 mg via ORAL
  Filled 2016-12-15 (×4): qty 1

## 2016-12-15 MED ORDER — ACETAMINOPHEN 325 MG PO TABS: 650.0000 mg | ORAL_TABLET | Freq: Four times a day (QID) | ORAL | Status: DC | PRN

## 2016-12-15 MED ORDER — MORPHINE SULFATE (PF) 2 MG/ML IV SOLN: 2.0000 mg | INTRAVENOUS | Status: DC | PRN

## 2016-12-15 MED ORDER — LOSARTAN POTASSIUM 50 MG PO TABS
25.0000 mg | ORAL_TABLET | Freq: Every day | ORAL | Status: DC
  Administered 2016-12-15 – 2016-12-18 (×4): 25 mg via ORAL
  Filled 2016-12-15 (×4): qty 1

## 2016-12-15 MED ORDER — POLYETHYLENE GLYCOL 3350 17 G PO PACK: 17.0000 g | PACK | Freq: Every day | ORAL | Status: DC | PRN

## 2016-12-15 MED ORDER — LEVETIRACETAM 500 MG PO TABS
500.0000 mg | ORAL_TABLET | Freq: Every day | ORAL | Status: DC
  Administered 2016-12-15 – 2016-12-18 (×4): 500 mg via ORAL
  Filled 2016-12-15 (×4): qty 1

## 2016-12-15 NOTE — ED Notes (Signed)
Patient transported to CT 

## 2016-12-15 NOTE — ED Triage Notes (Signed)
Pt comes in with lower abdominal pain starting Sunday. States he has had pain with urination and making a bowel movement. Pt made a loose bowel movement last night. Pt feels more tired than normal.

## 2016-12-15 NOTE — H&P (Signed)
History and Physical  Kyle [name]. JKK:[id number] DOB: October 20, 1934 DOA: 12/15/2016  PCP: Redmond School, MD  Patient coming from: home  Chief Complaint: abdominal pain  HPI:  12yom PMH myasthenia gravis, prostate cancer, presented to the emergency department with abdominal pain. CT revealed perforated diverticulitis without evidence of peritonitis. He was assessed by the general surgeon, and medical admission was recommended, treatment with antibiotics and close observation.  Patient reports symptoms began suddenly 9/2, with lower left-sided abdominal pain. Pain was constant and intense in nature. Aggravated by sitting upright and moving, improved with rest. No fever or systemic symptoms. Appetite has remained intact.  ED Course: Afebrile, vital signs stable, treated with Zosyn.   Review of Systems:  Negative for fever, new visual changes, sore throat, rash, new muscle aches, chest pain, shortness of breath, dysuria, bleeding, nausea, vomiting   Past Medical History:  Diagnosis Date  . Arthritis   . Auto immune neutropenia (HCC)   . Bullous pemphigoid   . Cancer [facility])    Prostate  . Cervical spondylosis without myelopathy 01/22/2016  . DVT (deep venous thrombosis) ([place])    a. Has had a previous right lower extremity DVT in the setting of hospitalization and surgery (after his brain tumor was resected in 1993) but is no longer on Coumadin.  Marland Kitchen Dyslipidemia   . History of prostate cancer   . Hypertension   . Meningioma ([place])    a. s/p resection 1990s.  . Myasthenia ([place]) 02/14/2013  . Nocturnal leg cramps   . Ocular myasthenia gravis ([place])   . Seizures ([place])   . Sinus bradycardia   . Sleep paralysis   . Thyroid nodule, cold     Past Surgical History:  Procedure Laterality Date  . BRAIN SURGERY    . CHOLECYSTECTOMY    . COLONOSCOPY N/A 07/25/2013   Procedure: COLONOSCOPY;  Surgeon: Jamesetta So, MD;  [facility];  Service: Gastroenterology;  Laterality:  N/A;  . COLONOSCOPY N/A 11/10/2016   Procedure: COLONOSCOPY;  Surgeon: Aviva Signs, MD;  Location: [facility];  Service: Gastroenterology;  Laterality: N/A;  . RADIOACTIVE SEED IMPLANT       reports that he has never smoked. He has never used smokeless tobacco. He reports that he does not drink alcohol or use drugs. Mobility: Ambulatory  Allergies  Allergen Reactions  . Bactericin [Bacitracin] Other (See Comments)    Unknown reaction   . Iohexol      Code: HIVES, Desc: pt was premedicated/hives reaction occurred 24 hours post injection, Onset Date: 71696789   . Ivp Dye [Iodinated Diagnostic Agents]   . Sulfa Antibiotics Swelling and Rash    Family History  Problem Relation Age of Onset  . Heart failure Mother   . Diabetes Mother   . Diabetes Sister   . Lung cancer Son   . Heart failure Daughter   . Colon cancer Neg Hx      Prior to Admission medications   Medication Sig Start Date End Date Taking? Authorizing Provider  atenolol (TENORMIN) 25 MG tablet TAKE 1 TABLET BY MOUTH  DAILY 09/01/16  Yes Kathrynn Ducking, MD  clobetasol (TEMOVATE) 0.05 % external solution Apply 1 application topically daily as needed (rash).  11/17/12  Yes [provider]  diclofenac (VOLTAREN) 75 MG EC tablet TAKE 1 TABLET BY MOUTH TWO  TIMES DAILY 10/19/16  Yes Kathrynn Ducking, MD  fish oil-omega-3 fatty acids 1000 MG capsule Take 1 g by mouth 3 (three)  times daily.   Yes [provider]  gabapentin (NEURONTIN) 400 MG capsule Take 1 capsule (400 mg total) by mouth at bedtime. 05/18/16  Yes Kathrynn Ducking, MD  Garlic (GARLIQUE PO) Take 1 tablet by mouth daily.   Yes [provider]  levETIRAcetam (KEPPRA) 500 MG tablet TAKE 1/2 TABLET BY MOUTH IN THE MORNING AND 1 TABLET IN THE EVENING 01/23/16  Yes Kathrynn Ducking, MD  losartan (COZAAR) 25 MG tablet Take 25 mg by mouth at bedtime.  06/02/16  Yes [provider]  magnesium oxide (MAGOX 400) 400 (241.3 Mg) MG  tablet Take 800 mg by mouth daily.   Yes [provider]  niacinamide 500 MG tablet Take 500 mg by mouth 3 (three) times daily.   Yes [provider]  Polyethyl Glycol-Propyl Glycol (SYSTANE) 0.4-0.3 % SOLN Place 1 drop into both eyes 3 (three) times daily as needed (for dry eyes).   Yes [provider]  predniSONE (DELTASONE) 5 MG tablet Take 2 tablets (10 mg total) by mouth daily with breakfast. 09/24/16  Yes Kathrynn Ducking, MD  pyridostigmine (MESTINON) 60 MG tablet Take 1.5 tablets (90 mg total) by mouth 3 (three) times daily. 09/14/16  Yes Kathrynn Ducking, MD  simvastatin (ZOCOR) 20 MG tablet Take 20 mg by mouth at bedtime.  06/02/16  Yes [provider]    Physical Exam:   97.7, 88, 101/45, 99% on room air. Appears calm, comfortable.  Eyes. Pupils, irises, lids appear unremarkable.  ENT. Grossly normal lips, tongue. Slightly hard of hearing.  Neck. No lymphadenopathy or masses. No thyromegaly.  Cardiovascular. Regular rate and rhythm. No murmur, rub or gallop. No lower extremity edema.  Respiratory. Clear to auscultation bilaterally. No wheezes, rales or rhonchi. Normal respiratory effort.  Abdomen. Soft, nondistended. No rebound or guarding. There is  moderate left lower quadrant pain with palpation. No hernias are noted.  Skin. No rash or induration. Nontender to palpation.  Musculoskeletal. Moves all extremities. Grossly normal tone and strength. Digits of the upper extremities appear unremarkable.  Psychiatric. Grossly normal mood and affect. Speech fluent and appropriate.   Wt Readings from Last 3 Encounters:  12/15/16 74.8 kg (165 lb)  11/03/16 77.1 kg (170 lb)  09/14/16 78 kg (172 lb)    I have personally reviewed following labs and imaging studies  Labs:   Complete metabolic panel unremarkable  WBC 21.1, platelets 147  Imaging studies:   CT abdomen and pelvis noted, extensive active diverticulitis of the sigmoid colon  with large amount of gas in the soft tissues in dissection superiorly into the retroperitoneum.   Principal Problem:   Perforation and abscess of large intestine concurrent with and due to diverticulitis Active Problems:   Myasthenia ([place])   Convulsions/seizures ([place])   Essential hypertension, benign   Assessment/Plan Perforated sigmoid diverticulitis without abscess or peritonitis. -Discussed with Dr. Arnoldo Morale. Plan bowel rest, sips with meds, IV antibiotics, analgesics as needed.  Myasthenia gravis. -Stable. Continue pyridostigmine.  Essential hypertension. -Stable. Currently bradycardic. Will hold metoprolol for now.  Seizure disorder, status post resection of meningioma. Last seizure approximately 12 years ago. -Continue Keppra   Severity of Illness: The appropriate patient status for this patient is INPATIENT. Inpatient status is judged to be reasonable and necessary in order to provide the required intensity of service to ensure the patient's safety. The patient's presenting symptoms, physical exam findings, and initial radiographic and laboratory data in the context of their chronic comorbidities is felt to  place them at high risk for further clinical deterioration. Furthermore, it is not anticipated that the patient will be medically stable for discharge from the hospital within 2 midnights of admission. The following factors support the patient status of inpatient.   * I certify that at the point of admission it is my clinical judgment that the patient will require inpatient hospital care spanning beyond 2 midnights from the point of admission due to high intensity of service, high risk for further deterioration and high frequency of surveillance required.*     DVT prophylaxis: SCDs Code Status: full Family Communication: wife and daughter at bedside   Time spent: 48 minutes  Murray Hodgkins, MD  [facility] Direct contact: [phone] --[place]ia [place]  --[email or web address]; password [email or web address]  7PM-7AM contact night coverage as above  12/15/2016, 2:51 PM

## 2016-12-15 NOTE — Progress Notes (Signed)
Pharmacy Antibiotic Note  Kyle [name]. is a 82 y.o. male admitted on 12/15/2016 with intra abdominal infection.  Pharmacy has been consulted for unasyn dosing.  Plan: Unasyn 3 gm IV q6 hours F/u renal function, cultures and clinical course  Height: 5\' 8"  (172.7 cm) Weight: 165 lb (74.8 kg) IBW/kg (Calculated) : 68.4  Temp (24hrs), Avg:98.2 F (36.8 C), Min:97.7 F (36.5 C), Max:98.7 F (37.1 C)   Recent Labs Lab 12/15/16 1031  WBC 21.1*  CREATININE 0.82    Estimated Creatinine Clearance: 69.5 mL/min (by C-G formula based on SCr of 0.82 mg/dL).    Allergies  Allergen Reactions  . Bactericin [Bacitracin] Other (See Comments)    Unknown reaction   . Iohexol      Code: HIVES, Desc: pt was premedicated/hives reaction occurred 24 hours post injection, Onset Date: [id number]   . Ivp Dye [Iodinated Diagnostic Agents]   . Sulfa Antibiotics Swelling and Rash     Thank you for allowing pharmacy to be a part of this patient's care.  Eurika Sandy, [place] 12/15/2016 2:57 PM

## 2016-12-15 NOTE — ED Notes (Signed)
Report given to Audrea Muscat, pt going to room 323.

## 2016-12-15 NOTE — Consult Note (Signed)
Reason for Consult: Perforated sigmoid diverticulitis Referring Physician: Dr. Tammi Sou. is an 82 y.o. male.  HPI: Patient is an 82 year old white male who presented emergency room with a 2 day history of worsening lower abdominal pain. He states it has been intermittent in nature, but worsened over the last 24 hours. He denies any nausea or vomiting. His pain is 6 out of 10. He denies any fever or chills. CT scan of the abdomen and pelvis revealed perforated sigmoid diverticulitis with retroperitoneal air. No free air is noted within the abdominal cavity. No abscess is present. I recently performed a colonoscopy on 11/10/2016 which showed pandiverticulosis.  Past Medical History:  Diagnosis Date  . Arthritis   . Auto immune neutropenia (HCC)   . Bullous pemphigoid   . Cancer [facility])    Prostate  . Cervical spondylosis without myelopathy 01/22/2016  . DVT (deep venous thrombosis) ([place])    a. Has had a previous right lower extremity DVT in the setting of hospitalization and surgery (after his brain tumor was resected in 1993) but is no longer on Coumadin.  Marland Kitchen Dyslipidemia   . History of prostate cancer   . Hypertension   . Meningioma ([place])    a. s/p resection 1990s.  . Myasthenia ([place]) 02/14/2013  . Nocturnal leg cramps   . Ocular myasthenia gravis ([place])   . Seizures ([place])   . Sinus bradycardia   . Sleep paralysis   . Thyroid nodule, cold     Past Surgical History:  Procedure Laterality Date  . BRAIN SURGERY    . CHOLECYSTECTOMY    . COLONOSCOPY N/A 07/25/2013   Procedure: COLONOSCOPY;  Surgeon: Jamesetta So, MD;  [facility];  Service: Gastroenterology;  Laterality: N/A;  . COLONOSCOPY N/A 11/10/2016   Procedure: COLONOSCOPY;  Surgeon: Aviva Signs, MD;  Location[facility];  Service: Gastroenterology;  Laterality: N/A;  . RADIOACTIVE SEED IMPLANT      Family History  Problem Relation Age of Onset  . Heart failure Mother   . Diabetes Mother    . Diabetes Sister   . Lung cancer Son   . Heart failure Daughter   . Colon cancer Neg Hx     Social History:  reports that he has never smoked. He has never used smokeless tobacco. He reports that he does not drink alcohol or use drugs.  Allergies:  Allergies  Allergen Reactions  . Bactericin [Bacitracin] Other (See Comments)    Unknown reaction   . Iohexol      Code: HIVES, Desc: pt was premedicated/hives reaction occurred 24 hours post injection, Onset Date: 44967591   . Ivp Dye [Iodinated Diagnostic Agents]   . Sulfa Antibiotics Swelling and Rash    Medications: Scheduled:  Results for orders placed or performed during the hospital encounter of 12/15/16 (from the past 48 hour(s))  Urinalysis, Routine w reflex microscopic     Status: Abnormal   Collection Time: 12/15/16  9:45 AM  Result Value Ref Range   Color, Urine YELLOW YELLOW   APPearance CLEAR CLEAR   Specific Gravity, Urine 1.021 1.005 - 1.030   pH 5.0 5.0 - 8.0   Glucose, UA 50 (A) NEGATIVE mg/dL   Hgb urine dipstick SMALL (A) NEGATIVE   Bilirubin Urine NEGATIVE NEGATIVE   Ketones, ur NEGATIVE NEGATIVE mg/dL   Protein, ur 30 (A) NEGATIVE mg/dL   Nitrite NEGATIVE NEGATIVE   Leukocytes, UA NEGATIVE NEGATIVE   RBC / HPF 0-5 0 -  5 RBC/hpf   WBC, UA 0-5 0 - 5 WBC/hpf   Bacteria, UA NONE SEEN NONE SEEN   Squamous Epithelial / LPF 0-5 (A) NONE SEEN  CBC with Differential/Platelet     Status: Abnormal   Collection Time: 12/15/16 10:31 AM  Result Value Ref Range   WBC 21.1 (H) 4.0 - 10.5 K/uL   RBC 4.25 4.22 - 5.81 MIL/uL   Hemoglobin 13.4 13.0 - 17.0 g/dL   HCT 40.7 39.0 - 52.0 %   MCV 95.8 78.0 - 100.0 fL   MCH 31.5 26.0 - 34.0 pg   MCHC 32.9 30.0 - 36.0 g/dL   RDW 13.4 11.5 - 15.5 %   Platelets 147 (L) 150 - 400 K/uL   Neutrophils Relative % 89 %   Lymphocytes Relative 4 %   Monocytes Relative 7 %   Eosinophils Relative 0 %   Basophils Relative 0 %   Neutro Abs 18.8 (H) 1.7 - 7.7 K/uL   Lymphs Abs 0.8  0.7 - 4.0 K/uL   Monocytes Absolute 1.5 (H) 0.1 - 1.0 K/uL   Eosinophils Absolute 0.0 0.0 - 0.7 K/uL   Basophils Absolute 0.0 0.0 - 0.1 K/uL   WBC Morphology INCREASED BANDS (>20% BANDS)     Comment: ATYPICAL LYMPHOCYTES  Comprehensive metabolic panel     Status: Abnormal   Collection Time: 12/15/16 10:31 AM  Result Value Ref Range   Sodium 133 (L) 135 - 145 mmol/L   Potassium 4.7 3.5 - 5.1 mmol/L   Chloride 101 101 - 111 mmol/L   CO2 23 22 - 32 mmol/L   Glucose, Bld 212 (H) 65 - 99 mg/dL   BUN 26 (H) 6 - 20 mg/dL   Creatinine, Ser 0.82 0.61 - 1.24 mg/dL   Calcium 8.8 (L) 8.9 - 10.3 mg/dL   Total Protein 7.1 6.5 - 8.1 g/dL   Albumin 3.5 3.5 - 5.0 g/dL   AST 40 15 - 41 U/L   ALT 64 (H) 17 - 63 U/L   Alkaline Phosphatase 68 38 - 126 U/L   Total Bilirubin 1.7 (H) 0.3 - 1.2 mg/dL   GFR calc non Af Amer >60 >60 mL/min   GFR calc Af Amer >60 >60 mL/min    Comment: (NOTE) The eGFR has been calculated using the CKD EPI equation. This calculation has not been validated in all clinical situations. eGFR's persistently <60 mL/min signify possible Chronic Kidney Disease.    Anion gap 9 5 - 15    Ct Abdomen Pelvis Wo Contrast  Result Date: 12/15/2016 CLINICAL DATA:  Abdominal pain, diverticulitis suspected EXAM: CT ABDOMEN AND PELVIS WITHOUT CONTRAST TECHNIQUE: Multidetector CT imaging of the abdomen and pelvis was performed following the standard protocol without IV contrast. COMPARISON:  None. FINDINGS: Lower chest: Lung bases are clear. No effusions. Heart is normal size. Calcifications in the visualize coronary arteries. Hepatobiliary: Scattered calcifications in the liver compatible with old granulomatous disease. Prior cholecystectomy. Pancreas: No focal abnormality or ductal dilatation. Spleen: No focal abnormality.  Normal size. Adrenals/Urinary Tract: Bilateral renal parapelvic cysts. No hydronephrosis. Two calcifications noted in the bladder or possibly at the left ureterovesical  junction. These are punctate 1-2 mm stones. Bilateral nonobstructing renal stones. Adrenal glands unremarkable. Stomach/Bowel: Extensive diverticular disease in the sigmoid colon. Extensive stranding and extraluminal gas noted throughout the pelvis adjacent to the sigmoid colon, extending superiorly in the retroperitoneum compatible with perforation. No fluid collection. No evidence of bowel obstruction. Vascular/Lymphatic: Aortic and iliac calcifications. No aneurysm or  adenopathy. Reproductive: Prostate radiation seeds in place. Other: No free fluid. Musculoskeletal: Degenerative changes in the lumbar spine. No acute bony abnormality. IMPRESSION: Sigmoid diverticulosis with extensive changes of active diverticulitis and moderate to large amount of extraluminal gas in the soft tissues surrounding the sigmoid colon and dissecting superiorly in the retroperitoneum compatible with bowel perforation. Bilateral nephrolithiasis. Punctate 1-2 mm stones within the bladder or possibly at the left ureterovesical junction. No hydronephrosis. Aortoiliac atherosclerosis. Coronary artery disease. Electronically Signed   By: Rolm Baptise M.D.   On: 12/15/2016 10:56    ROS:  Pertinent items are noted in HPI.  Blood pressure (!) 107/52, pulse 60, temperature 98.7 F (37.1 C), temperature source Oral, resp. rate 20, SpO2 96 %. Physical Exam: Well-developed well-nourished white male in no acute distress Head is normocephalic, atraumatic Lungs clear auscultation with breath sounds bilaterally Heart examination reveals a regular rate and rhythm without S3, S4, murmurs Abdomen is soft with some discomfort to palpation in the suprapubic region. No rigidity is noted. No hepatosplenomegaly is noted.  CT scan images personally reviewed  Assessment/Plan: Impression: Sigmoid diverticulitis with contained retroperitoneal perforation. No abscess is present at this time. Plan: Patient be admitted by the hospitalist for bowel  rest and IV antibiotics. He may need a follow-up in 3-5 days. He does not need acute surgical intervention at this time. Patient is aware and agreeable to the treatment plan. Patient may have ice chips and sips of clears to take medicine.  Aviva Signs 12/15/2016, 12:20 PM

## 2016-12-15 NOTE — ED Notes (Signed)
Pt returned from CT °

## 2016-12-15 NOTE — ED Provider Notes (Signed)
[facility] DEPT Provider Note   CSN: [id number] Arrival date & time: 12/15/16  7494     History   Chief Complaint Chief Complaint  Patient presents with  . Abdominal Pain    HPI Huel Centola. is a 82 y.o. male.chief complaint is lower abdominal pain  HPI: 82 year old male. Normal colonoscopy in June area and occasional discomfort with bowel movements. Has had symptoms for the last 2 days.  He describes suprapubic and left lower quadrant pain. Worse with turning over in bed with ambulation. Had chills and sweats yesterday while sitting in his recliner. History of prostate cancer status post radium seed implant. His PSA has returned normal and his function has normalized. He states that he normally does not have nocturia. For the last week he has had nocturia 2 or 3 episodes per night. No dysuria or difficulty starting stream. No gross hematuria.  Has known diverticuli from previous colonoscopy and imaging. Has never had an episode of diverticulitis.  Past Medical History:  Diagnosis Date  . Arthritis   . Auto immune neutropenia (HCC)   . Bullous pemphigoid   . Cancer [facility])    Prostate  . Cervical spondylosis without myelopathy 01/22/2016  . DVT (deep venous thrombosis) ([place])    a. Has had a previous right lower extremity DVT in the setting of hospitalization and surgery (after his brain tumor was resected in 1993) but is no longer on Coumadin.  Marland Kitchen Dyslipidemia   . History of prostate cancer   . Hypertension   . Meningioma ([place])    a. s/p resection 1990s.  . Myasthenia ([place]) 02/14/2013  . Nocturnal leg cramps   . Ocular myasthenia gravis ([place])   . Seizures ([place])   . Sinus bradycardia   . Sleep paralysis   . Thyroid nodule, cold     Patient Active Problem List   Diagnosis Date Noted  . Blood in stool   . Second degree hemorrhoids   . Diverticulosis of large intestine without diverticulitis   . Essential hypertension, benign 02/03/2016  . Cervical spondylosis  without myelopathy 01/22/2016  . Nocturnal leg cramps 07/24/2015  . Nontoxic multinodular goiter 01/31/2015  . Elevated transaminase level 10/26/2014  . Chest pain 10/25/2014  . Dyspnea 10/25/2014  . Thrombocytopenia ([place]) 10/25/2014  . Myasthenia ([place]) 02/14/2013  . Convulsions/seizures ([place]) 02/14/2013    Past Surgical History:  Procedure Laterality Date  . BRAIN SURGERY    . CHOLECYSTECTOMY    . COLONOSCOPY N/A 07/25/2013   Procedure: COLONOSCOPY;  Surgeon: Jamesetta So, MD;  Location: [facility];  Service: Gastroenterology;  Laterality: N/A;  . COLONOSCOPY N/A 11/10/2016   Procedure: COLONOSCOPY;  Surgeon: Aviva Signs, MD;  Location: [facility] END[facility] SUITE;  Service: Gastroenterology;  Laterality: N/A;  . RADIOACTIVE SEED IMPLANT         Home Medications    Prior to Admission medications   Medication Sig Start Date End Date Taking? Authorizing Provider  atenolol (TENORMIN) 25 MG tablet TAKE 1 TABLET BY MOUTH  DAILY 09/01/16  Yes Kathrynn Ducking, MD  clobetasol (TEMOVATE) 0.05 % external solution Apply 1 application topically daily as needed (rash).  11/17/12  Yes [provider]  diclofenac (VOLTAREN) 75 MG EC tablet TAKE 1 TABLET BY MOUTH TWO  TIMES DAILY 10/19/16  Yes Kathrynn Ducking, MD  fish oil-omega-3 fatty acids 1000 MG capsule Take 1 g by mouth 3 (three) times daily.   Yes [provider]  gabapentin (NEURONTIN) 400 MG capsule  Take 1 capsule (400 mg total) by mouth at bedtime. 05/18/16  Yes Kathrynn Ducking, MD  Garlic (GARLIQUE PO) Take 1 tablet by mouth daily.   Yes [provider]  levETIRAcetam (KEPPRA) 500 MG tablet TAKE 1/2 TABLET BY MOUTH IN THE MORNING AND 1 TABLET IN THE EVENING 01/23/16  Yes Kathrynn Ducking, MD  losartan (COZAAR) 25 MG tablet Take 25 mg by mouth at bedtime.  06/02/16  Yes [provider]  magnesium oxide (MAGOX 400) 400 (241.3 Mg) MG tablet Take 800 mg by mouth daily.   Yes [provider]    niacinamide 500 MG tablet Take 500 mg by mouth 3 (three) times daily.   Yes [provider]  Polyethyl Glycol-Propyl Glycol (SYSTANE) 0.4-0.3 % SOLN Place 1 drop into both eyes 3 (three) times daily as needed (for dry eyes).   Yes [provider]  predniSONE (DELTASONE) 5 MG tablet Take 2 tablets (10 mg total) by mouth daily with breakfast. 09/24/16  Yes Kathrynn Ducking, MD  pyridostigmine (MESTINON) 60 MG tablet Take 1.5 tablets (90 mg total) by mouth 3 (three) times daily. 09/14/16  Yes Kathrynn Ducking, MD  simvastatin (ZOCOR) 20 MG tablet Take 20 mg by mouth at bedtime.  06/02/16  Yes [provider]    Family History Family History  Problem Relation Age of Onset  . Heart failure Mother   . Diabetes Mother   . Diabetes Sister   . Lung cancer Son   . Heart failure Daughter   . Colon cancer Neg Hx     Social History Social History  Substance Use Topics  . Smoking status: Never Smoker  . Smokeless tobacco: Never Used  . Alcohol use No     Allergies   Bactericin [bacitracin]; Iohexol; Ivp dye [iodinated diagnostic agents]; and Sulfa antibiotics   Review of Systems Review of Systems  Constitutional: Positive for diaphoresis and fatigue. Negative for appetite change, chills and fever.  HENT: Negative for mouth sores, sore throat and trouble swallowing.   Eyes: Negative for visual disturbance.  Respiratory: Negative for cough, chest tightness, shortness of breath and wheezing.   Cardiovascular: Negative for chest pain.  Gastrointestinal: Positive for abdominal pain. Negative for abdominal distention, diarrhea, nausea and vomiting.  Endocrine: Negative for polydipsia, polyphagia and polyuria.  Genitourinary: Positive for frequency and urgency. Negative for dysuria and hematuria.  Musculoskeletal: Negative for gait problem.  Skin: Negative for color change, pallor and rash.  Neurological: Negative for dizziness, syncope, light-headedness and  headaches.  Hematological: Does not bruise/bleed easily.  Psychiatric/Behavioral: Negative for behavioral problems and confusion.     Physical Exam Updated Vital Signs BP (!) 147/71 (BP Location: Right Arm)   Pulse 66   Temp 98.7 F (37.1 C) (Oral)   Resp 20   SpO2 99%   Physical Exam  Constitutional: He is oriented to person, place, and time. He appears well-developed and well-nourished. No distress.  HENT:  Head: Normocephalic.  Eyes: Pupils are equal, round, and reactive to light. Conjunctivae are normal. No scleral icterus.  Neck: Normal range of motion. Neck supple. No thyromegaly present.  Cardiovascular: Normal rate and regular rhythm.  Exam reveals no gallop and no friction rub.   No murmur heard. Pulmonary/Chest: Effort normal and breath sounds normal. No respiratory distress. He has no wheezes. He has no rales.  Abdominal: Soft. Bowel sounds are normal. He exhibits no distension. There is no tenderness. There is no rebound.  Minimal suprapubic and left  lower quadrant tenderness to deep palpation. No peritoneal irritation.  Musculoskeletal: Normal range of motion.  Neurological: He is alert and oriented to person, place, and time.  Skin: Skin is warm and dry. No rash noted.  Psychiatric: He has a normal mood and affect. His behavior is normal.     ED Treatments / Results  Labs (all labs ordered are listed, but only abnormal results are displayed) Labs Reviewed  URINALYSIS, ROUTINE W REFLEX MICROSCOPIC - Abnormal; Notable for the following:       Result Value   Glucose, UA 50 (*)    Hgb urine dipstick SMALL (*)    Protein, ur 30 (*)    Squamous Epithelial / LPF 0-5 (*)    All other components within normal limits  CBC WITH DIFFERENTIAL/PLATELET - Abnormal; Notable for the following:    WBC 21.1 (*)    Platelets 147 (*)    Neutro Abs 18.8 (*)    Monocytes Absolute 1.5 (*)    All other components within normal limits  COMPREHENSIVE METABOLIC PANEL - Abnormal;  Notable for the following:    Sodium 133 (*)    Glucose, Bld 212 (*)    BUN 26 (*)    Calcium 8.8 (*)    ALT 64 (*)    Total Bilirubin 1.7 (*)    All other components within normal limits  URINE CULTURE    EKG  EKG Interpretation None       Radiology Ct Abdomen Pelvis Wo Contrast  Result Date: 12/15/2016 CLINICAL DATA:  Abdominal pain, diverticulitis suspected EXAM: CT ABDOMEN AND PELVIS WITHOUT CONTRAST TECHNIQUE: Multidetector CT imaging of the abdomen and pelvis was performed following the standard protocol without IV contrast. COMPARISON:  None. FINDINGS: Lower chest: Lung bases are clear. No effusions. Heart is normal size. Calcifications in the visualize coronary arteries. Hepatobiliary: Scattered calcifications in the liver compatible with old granulomatous disease. Prior cholecystectomy. Pancreas: No focal abnormality or ductal dilatation. Spleen: No focal abnormality.  Normal size. Adrenals/Urinary Tract: Bilateral renal parapelvic cysts. No hydronephrosis. Two calcifications noted in the bladder or possibly at the left ureterovesical junction. These are punctate 1-2 mm stones. Bilateral nonobstructing renal stones. Adrenal glands unremarkable. Stomach/Bowel: Extensive diverticular disease in the sigmoid colon. Extensive stranding and extraluminal gas noted throughout the pelvis adjacent to the sigmoid colon, extending superiorly in the retroperitoneum compatible with perforation. No fluid collection. No evidence of bowel obstruction. Vascular/Lymphatic: Aortic and iliac calcifications. No aneurysm or adenopathy. Reproductive: Prostate radiation seeds in place. Other: No free fluid. Musculoskeletal: Degenerative changes in the lumbar spine. No acute bony abnormality. IMPRESSION: Sigmoid diverticulosis with extensive changes of active diverticulitis and moderate to large amount of extraluminal gas in the soft tissues surrounding the sigmoid colon and dissecting superiorly in the  retroperitoneum compatible with bowel perforation. Bilateral nephrolithiasis. Punctate 1-2 mm stones within the bladder or possibly at the left ureterovesical junction. No hydronephrosis. Aortoiliac atherosclerosis. Coronary artery disease. Electronically Signed   By: Rolm Baptise M.D.   On: 12/15/2016 10:56    Procedures Procedures (including critical care time)  Medications Ordered in ED Medications  piperacillin-tazobactam (ZOSYN) IVPB 3.375 g (not administered)     Initial Impression / Assessment and Plan / ED Course  I have reviewed the triage vital signs and the nursing notes.  Pertinent labs & imaging results that were available during my care of the patient were reviewed by me and considered in my medical decision making (see chart for details).    Differential  diagnosis diverticulitis, doubt constipation, prostatitis, doubt cystitis or Tylenol. Plan imaging. We'll be noncontrast as patient has significant contrast allergy. Lab. Reevaluation after the above.   CT scan shows extensive diverticuli with diverticulitis and perforation. Gas is contained within the pelvis. No diffuse pneumoperitoneum. Given IV Zosyn. We'll discussed with hospitalist, and general surgery.  Final Clinical Impressions(s) / ED Diagnoses   Final diagnoses:  Diverticulitis  Bowel perforation [facility])    New Prescriptions New Prescriptions   No medications on file     Tanna Furry, MD 12/15/16 1118

## 2016-12-16 DIAGNOSIS — K5720 Diverticulitis of large intestine with perforation and abscess without bleeding: Principal | ICD-10-CM

## 2016-12-16 DIAGNOSIS — K631 Perforation of intestine (nontraumatic): Secondary | ICD-10-CM

## 2016-12-16 LAB — BASIC METABOLIC PANEL
ANION GAP: 10 (ref 5–15)
BUN: 21 mg/dL — ABNORMAL HIGH (ref 6–20)
CALCIUM: 8.6 mg/dL — AB (ref 8.9–10.3)
CO2: 23 mmol/L (ref 22–32)
Chloride: 101 mmol/L (ref 101–111)
Creatinine, Ser: 0.66 mg/dL (ref 0.61–1.24)
GLUCOSE: 94 mg/dL (ref 65–99)
POTASSIUM: 4 mmol/L (ref 3.5–5.1)
SODIUM: 134 mmol/L — AB (ref 135–145)

## 2016-12-16 LAB — C DIFFICILE QUICK SCREEN W PCR REFLEX
C DIFFICILE (CDIFF) INTERP: NOT DETECTED
C DIFFICILE (CDIFF) TOXIN: NEGATIVE
C DIFFICLE (CDIFF) ANTIGEN: NEGATIVE

## 2016-12-16 LAB — CBC
HEMATOCRIT: 41.2 % (ref 39.0–52.0)
Hemoglobin: 13.6 g/dL (ref 13.0–17.0)
MCH: 31.6 pg (ref 26.0–34.0)
MCHC: 33 g/dL (ref 30.0–36.0)
MCV: 95.6 fL (ref 78.0–100.0)
Platelets: 148 10*3/uL — ABNORMAL LOW (ref 150–400)
RBC: 4.31 MIL/uL (ref 4.22–5.81)
RDW: 13.7 % (ref 11.5–15.5)
WBC: 18.5 10*3/uL — AB (ref 4.0–10.5)

## 2016-12-16 MED ORDER — SACCHAROMYCES BOULARDII 250 MG PO CAPS
250.0000 mg | ORAL_CAPSULE | Freq: Two times a day (BID) | ORAL | Status: DC
Start: 2016-12-16 — End: 2016-12-19
  Administered 2016-12-16 – 2016-12-19 (×6): 250 mg via ORAL
  Filled 2016-12-16 (×6): qty 1

## 2016-12-16 MED ORDER — ENOXAPARIN SODIUM 40 MG/0.4ML ~~LOC~~ SOLN
40.0000 mg | SUBCUTANEOUS | Status: DC
  Administered 2016-12-16 – 2016-12-19 (×4): 40 mg via SUBCUTANEOUS
  Filled 2016-12-16 (×4): qty 0.4

## 2016-12-16 NOTE — Progress Notes (Signed)
[facility] PROGRESS NOTE  Kyle [name]. LOV:[id number] DOB: 1934-08-02 DOA: 12/15/2016   PCP: Redmond School, MD     Assessment/Plan: Principal Problem:   Perforation and abscess of large intestine concurrent with and due to diverticulitis Active Problems:   Myasthenia ([place])   Convulsions/seizures ([place])   Essential hypertension, benign   Bowel perforation ([place])     82yom PMH myasthenia gravis, prostate cancer, presented to the emergency department with abdominal pain. CT revealed perforated diverticulitis without evidence of peritonitis. He was assessed by the general surgeon, and medical admission was recommended, treatment with antibiotics and close observation.  Assessment and plan Perforated sigmoid diverticulitis without abscess or peritonitis. Continue Unasyn, clear liquids, leukocytosis improving, clinically improving GI panel for stool studies   Myasthenia gravis. -Stable. Continue pyridostigmine.  Essential hypertension. -Stable. Currently bradycardic.  Continue to hold metoprolol  Seizure disorder, status post resection of meningioma. Last seizure approximately 12 years ago. -Continue Keppra   DVT prophylaxsis Lovenox  Code Status:   Full code    Family Communication: Discussed in detail with the patient, all imaging results, lab results explained to the patient   Disposition Plan:  1-2 days      Consultants:  Surgery  Procedures:  None    Antibiotics: Anti-infectives    Start     Dose/Rate Route Frequency Ordered Stop   12/15/16 1600  Ampicillin-Sulbactam (UNASYN) 3 g in sodium chloride 0.9 % 100 mL IVPB     3 g 200 mL/hr over 30 Minutes Intravenous Every 6 hours 12/15/16 1453     12/15/16 1130  piperacillin-tazobactam (ZOSYN) IVPB 3.375 g     3.375 g 100 mL/hr over 30 Minutes Intravenous  Once 12/15/16 1115 12/15/16 1209         HPI/Subjective: Patient complains of having 2 bowel movements today, has had  diarrhea yesterday  Objective: Vitals:   12/15/16 2041 12/15/16 2059 12/16/16 0543 12/16/16 1119  BP: (!) 135/54  128/62   Pulse: (!) 58  60   Resp:   20   Temp: (!) 97.5 F (36.4 C)  98 F (36.7 C)   TempSrc: Oral  Oral   SpO2: 96% 94% 95% (!) 89%  Weight:      Height:        Intake/Output Summary (Last 24 hours) at 12/16/16 1206 Last data filed at 12/15/16 1523  Gross per 24 hour  Intake                0 ml  Output              150 ml  Net             -150 ml    Exam:  Examination:  General exam: Appears calm and comfortable  Respiratory system: Clear to auscultation. Respiratory effort normal. Cardiovascular system: S1 & S2 heard, RRR. No JVD, murmurs, rubs, gallops or clicks. No pedal edema. Gastrointestinal system: Abdomen is nondistended, soft and nontender. No organomegaly or masses felt. Normal bowel sounds heard. Central nervous system: Alert and oriented. No focal neurological deficits. Extremities: Symmetric 5 x 5 power. Skin: No rashes, lesions or ulcers Psychiatry: Judgement and insight appear normal. Mood & affect appropriate.     Data Reviewed: I have personally reviewed following labs and imaging studies  Micro Results No results found for this or any previous visit (from the past 240 hour(s)).  Radiology Reports Ct Abdomen Pelvis Wo Contrast  Result Date: 12/15/2016  CLINICAL DATA:  Abdominal pain, diverticulitis suspected EXAM: CT ABDOMEN AND PELVIS WITHOUT CONTRAST TECHNIQUE: Multidetector CT imaging of the abdomen and pelvis was performed following the standard protocol without IV contrast. COMPARISON:  None. FINDINGS: Lower chest: Lung bases are clear. No effusions. Heart is normal size. Calcifications in the visualize coronary arteries. Hepatobiliary: Scattered calcifications in the liver compatible with old granulomatous disease. Prior cholecystectomy. Pancreas: No focal abnormality or ductal dilatation. Spleen: No focal abnormality.  Normal size.  Adrenals/Urinary Tract: Bilateral renal parapelvic cysts. No hydronephrosis. Two calcifications noted in the bladder or possibly at the left ureterovesical junction. These are punctate 1-2 mm stones. Bilateral nonobstructing renal stones. Adrenal glands unremarkable. Stomach/Bowel: Extensive diverticular disease in the sigmoid colon. Extensive stranding and extraluminal gas noted throughout the pelvis adjacent to the sigmoid colon, extending superiorly in the retroperitoneum compatible with perforation. No fluid collection. No evidence of bowel obstruction. Vascular/Lymphatic: Aortic and iliac calcifications. No aneurysm or adenopathy. Reproductive: Prostate radiation seeds in place. Other: No free fluid. Musculoskeletal: Degenerative changes in the lumbar spine. No acute bony abnormality. IMPRESSION: Sigmoid diverticulosis with extensive changes of active diverticulitis and moderate to large amount of extraluminal gas in the soft tissues surrounding the sigmoid colon and dissecting superiorly in the retroperitoneum compatible with bowel perforation. Bilateral nephrolithiasis. Punctate 1-2 mm stones within the bladder or possibly at the left ureterovesical junction. No hydronephrosis. Aortoiliac atherosclerosis. Coronary artery disease. Electronically Signed   By: Rolm Baptise M.D.   On: 12/15/2016 10:56     CBC  Recent Labs Lab 12/15/16 1031 12/16/16 0631  WBC 21.1* 18.5*  HGB 13.4 13.6  HCT 40.7 41.2  PLT 147* 148*  MCV 95.8 95.6  MCH 31.5 31.6  MCHC 32.9 33.0  RDW 13.4 13.7  LYMPHSABS 0.8  --   MONOABS 1.5*  --   EOSABS 0.0  --   BASOSABS 0.0  --     Chemistries   Recent Labs Lab 12/15/16 1031 12/16/16 0631  NA 133* 134*  K 4.7 4.0  CL 101 101  CO2 23 23  GLUCOSE 212* 94  BUN 26* 21*  CREATININE 0.82 0.66  CALCIUM 8.8* 8.6*  AST 40  --   ALT 64*  --   ALKPHOS 68  --   BILITOT 1.7*  --     ------------------------------------------------------------------------------------------------------------------ estimated creatinine clearance is 71.3 mL/min (by C-G formula based on SCr of 0.66 mg/dL). ------------------------------------------------------------------------------------------------------------------ No results for input(s): HGBA1C in the last 72 hours. ------------------------------------------------------------------------------------------------------------------ No results for input(s): CHOL, HDL, LDLCALC, TRIG, CHOLHDL, LDLDIRECT in the last 72 hours. ------------------------------------------------------------------------------------------------------------------ No results for input(s): TSH, T4TOTAL, T3FREE, THYROIDAB in the last 72 hours.  Invalid input(s): FREET3 ------------------------------------------------------------------------------------------------------------------ No results for input(s): VITAMINB12, FOLATE, FERRITIN, TIBC, IRON, RETICCTPCT in the last 72 hours.  Coagulation profile No results for input(s): INR, PROTIME in the last 168 hours.  No results for input(s): DDIMER in the last 72 hours.  Cardiac Enzymes No results for input(s): CKMB, TROPONINI, MYOGLOBIN in the last 168 hours.  Invalid input(s): CK ------------------------------------------------------------------------------------------------------------------ Invalid input(s): POCBNP   CBG: No results for input(s): GLUCAP in the last 168 hours.     Studies: Ct Abdomen Pelvis Wo Contrast  Result Date: 12/15/2016 CLINICAL DATA:  Abdominal pain, diverticulitis suspected EXAM: CT ABDOMEN AND PELVIS WITHOUT CONTRAST TECHNIQUE: Multidetector CT imaging of the abdomen and pelvis was performed following the standard protocol without IV contrast. COMPARISON:  None. FINDINGS: Lower chest: Lung bases are clear. No effusions. Heart is normal size. Calcifications in the  visualize coronary  arteries. Hepatobiliary: Scattered calcifications in the liver compatible with old granulomatous disease. Prior cholecystectomy. Pancreas: No focal abnormality or ductal dilatation. Spleen: No focal abnormality.  Normal size. Adrenals/Urinary Tract: Bilateral renal parapelvic cysts. No hydronephrosis. Two calcifications noted in the bladder or possibly at the left ureterovesical junction. These are punctate 1-2 mm stones. Bilateral nonobstructing renal stones. Adrenal glands unremarkable. Stomach/Bowel: Extensive diverticular disease in the sigmoid colon. Extensive stranding and extraluminal gas noted throughout the pelvis adjacent to the sigmoid colon, extending superiorly in the retroperitoneum compatible with perforation. No fluid collection. No evidence of bowel obstruction. Vascular/Lymphatic: Aortic and iliac calcifications. No aneurysm or adenopathy. Reproductive: Prostate radiation seeds in place. Other: No free fluid. Musculoskeletal: Degenerative changes in the lumbar spine. No acute bony abnormality. IMPRESSION: Sigmoid diverticulosis with extensive changes of active diverticulitis and moderate to large amount of extraluminal gas in the soft tissues surrounding the sigmoid colon and dissecting superiorly in the retroperitoneum compatible with bowel perforation. Bilateral nephrolithiasis. Punctate 1-2 mm stones within the bladder or possibly at the left ureterovesical junction. No hydronephrosis. Aortoiliac atherosclerosis. Coronary artery disease. Electronically Signed   By: Rolm Baptise M.D.   On: 12/15/2016 10:56      Lab Results  Component Value Date   HGBA1C 5.9 (H) 10/25/2014   Lab Results  Component Value Date   LDLCALC 89 10/26/2014   CREATININE 0.66 12/16/2016       Scheduled Meds: . gabapentin  400 mg Oral QHS  . levETIRAcetam  250 mg Oral q morning - 10a  . levETIRAcetam  500 mg Oral QHS  . losartan  25 mg Oral QHS  . niacinamide  500 mg Oral TID  . predniSONE  20 mg Oral  Q breakfast  . pyridostigmine  90 mg Oral TID  . simvastatin  20 mg Oral QHS   Continuous Infusions: . sodium chloride 75 mL/hr at 12/15/16 1523  . ampicillin-sulbactam (UNASYN) IV 3 g (12/16/16 0455)     LOS: 1 day    Time spent: >30 MINS    Reyne Dumas  [facility] Pager [phone]. If 7PM-7AM, please contact night-coverage at [email or web address], password [facility] 12/16/2016, 12:06 PM  LOS: 1 day

## 2016-12-16 NOTE — Progress Notes (Signed)
Subjective: Patient has had multiple bowel movements since admission. He states his abdominal pain is still present when having a bowel movement, but has not worsened. He denies any fever or chills.  Objective: Vital signs in last 24 hours: Temp:  [97.5 F (36.4 C)-98 F (36.7 C)] 98 F (36.7 C) (09/05 0543) Pulse Rate:  [58-88] 60 (09/05 0543) Resp:  [20] 20 (09/05 0543) BP: (101-135)/(45-62) 128/62 (09/05 0543) SpO2:  [94 %-99 %] 95 % (09/05 0543) Weight:  [165 lb (74.8 kg)] 165 lb (74.8 kg) (09/04 1248) Last BM Date: 12/15/16  Intake/Output from previous day: 09/04 0701 - 09/05 0700 In: 0  Out: 150 [Urine:150] Intake/Output this shift: No intake/output data recorded.  General appearance: alert, cooperative and no distress GI: Soft with tenderness to deep palpation in the suprapubic region. No rigidity is noted. Bowel sounds active.  Lab Results:   Recent Labs  12/15/16 1031 12/16/16 0631  WBC 21.1* 18.5*  HGB 13.4 13.6  HCT 40.7 41.2  PLT 147* 148*   BMET  Recent Labs  12/15/16 1031 12/16/16 0631  NA 133* 134*  K 4.7 4.0  CL 101 101  CO2 23 23  GLUCOSE 212* 94  BUN 26* 21*  CREATININE 0.82 0.66  CALCIUM 8.8* 8.6*   PT/INR No results for input(s): LABPROT, INR in the last 72 hours.  Studies/Results: Ct Abdomen Pelvis Wo Contrast  Result Date: 12/15/2016 CLINICAL DATA:  Abdominal pain, diverticulitis suspected EXAM: CT ABDOMEN AND PELVIS WITHOUT CONTRAST TECHNIQUE: Multidetector CT imaging of the abdomen and pelvis was performed following the standard protocol without IV contrast. COMPARISON:  None. FINDINGS: Lower chest: Lung bases are clear. No effusions. Heart is normal size. Calcifications in the visualize coronary arteries. Hepatobiliary: Scattered calcifications in the liver compatible with old granulomatous disease. Prior cholecystectomy. Pancreas: No focal abnormality or ductal dilatation. Spleen: No focal abnormality.  Normal size.  Adrenals/Urinary Tract: Bilateral renal parapelvic cysts. No hydronephrosis. Two calcifications noted in the bladder or possibly at the left ureterovesical junction. These are punctate 1-2 mm stones. Bilateral nonobstructing renal stones. Adrenal glands unremarkable. Stomach/Bowel: Extensive diverticular disease in the sigmoid colon. Extensive stranding and extraluminal gas noted throughout the pelvis adjacent to the sigmoid colon, extending superiorly in the retroperitoneum compatible with perforation. No fluid collection. No evidence of bowel obstruction. Vascular/Lymphatic: Aortic and iliac calcifications. No aneurysm or adenopathy. Reproductive: Prostate radiation seeds in place. Other: No free fluid. Musculoskeletal: Degenerative changes in the lumbar spine. No acute bony abnormality. IMPRESSION: Sigmoid diverticulosis with extensive changes of active diverticulitis and moderate to large amount of extraluminal gas in the soft tissues surrounding the sigmoid colon and dissecting superiorly in the retroperitoneum compatible with bowel perforation. Bilateral nephrolithiasis. Punctate 1-2 mm stones within the bladder or possibly at the left ureterovesical junction. No hydronephrosis. Aortoiliac atherosclerosis. Coronary artery disease. Electronically Signed   By: Rolm Baptise M.D.   On: 12/15/2016 10:56    Anti-infectives: Anti-infectives    Start     Dose/Rate Route Frequency Ordered Stop   12/15/16 1600  Ampicillin-Sulbactam (UNASYN) 3 g in sodium chloride 0.9 % 100 mL IVPB     3 g 200 mL/hr over 30 Minutes Intravenous Every 6 hours 12/15/16 1453     12/15/16 1130  piperacillin-tazobactam (ZOSYN) IVPB 3.375 g     3.375 g 100 mL/hr over 30 Minutes Intravenous  Once 12/15/16 1115 12/15/16 1209      Assessment/Plan: Impression: Contained perforated sigmoid diverticulitis. Leukocytosis starting to resolve. Clinically, the patient appears  somewhat better. Plan: Continue current management.  LOS: 1  day    Aviva Signs 12/16/2016

## 2016-12-17 LAB — COMPREHENSIVE METABOLIC PANEL
ALT: 37 U/L (ref 17–63)
ANION GAP: 10 (ref 5–15)
AST: 22 U/L (ref 15–41)
Albumin: 3.2 g/dL — ABNORMAL LOW (ref 3.5–5.0)
Alkaline Phosphatase: 68 U/L (ref 38–126)
BUN: 18 mg/dL (ref 6–20)
CHLORIDE: 101 mmol/L (ref 101–111)
CO2: 25 mmol/L (ref 22–32)
Calcium: 8.9 mg/dL (ref 8.9–10.3)
Creatinine, Ser: 0.65 mg/dL (ref 0.61–1.24)
GFR calc non Af Amer: >60 mL/min (ref >60)
Glucose, Bld: 80 mg/dL (ref 65–99)
POTASSIUM: 3.6 mmol/L (ref 3.5–5.1)
SODIUM: 136 mmol/L (ref 135–145)
Total Bilirubin: 1.1 mg/dL (ref 0.3–1.2)
Total Protein: 7.3 g/dL (ref 6.5–8.1)

## 2016-12-17 LAB — GASTROINTESTINAL PANEL BY PCR, STOOL (REPLACES STOOL CULTURE)
ADENOVIRUS F40/41: NOT DETECTED
ASTROVIRUS: NOT DETECTED
CYCLOSPORA CAYETANENSIS: NOT DETECTED
Campylobacter species: NOT DETECTED
Cryptosporidium: NOT DETECTED
ENTAMOEBA HISTOLYTICA: NOT DETECTED
ENTEROPATHOGENIC E COLI (EPEC): DETECTED — AB
ENTEROTOXIGENIC E COLI (ETEC): NOT DETECTED
Enteroaggregative E coli (EAEC): NOT DETECTED
Giardia lamblia: NOT DETECTED
Norovirus GI/GII: NOT DETECTED
Plesimonas shigelloides: NOT DETECTED
Rotavirus A: NOT DETECTED
SAPOVIRUS (I, II, IV, AND V): NOT DETECTED
Salmonella species: NOT DETECTED
Shiga like toxin producing E coli (STEC): NOT DETECTED
Shigella/Enteroinvasive E coli (EIEC): NOT DETECTED
VIBRIO CHOLERAE: NOT DETECTED
VIBRIO SPECIES: NOT DETECTED
YERSINIA ENTEROCOLITICA: NOT DETECTED

## 2016-12-17 LAB — CBC
HCT: 40.2 % (ref 39.0–52.0)
Hemoglobin: 13.6 g/dL (ref 13.0–17.0)
MCH: 31.7 pg (ref 26.0–34.0)
MCHC: 33.8 g/dL (ref 30.0–36.0)
MCV: 93.7 fL (ref 78.0–100.0)
PLATELETS: 176 10*3/uL (ref 150–400)
RBC: 4.29 MIL/uL (ref 4.22–5.81)
RDW: 13.5 % (ref 11.5–15.5)
WBC: 17.2 10*3/uL — ABNORMAL HIGH (ref 4.0–10.5)

## 2016-12-17 LAB — URINE CULTURE: Culture: <10000 — AB

## 2016-12-17 MED ORDER — LOPERAMIDE HCL 2 MG PO CAPS
2.0000 mg | ORAL_CAPSULE | Freq: Once | ORAL | Status: AC
  Administered 2016-12-17: 2 mg via ORAL
  Filled 2016-12-17: qty 1

## 2016-12-17 MED ORDER — PIPERACILLIN-TAZOBACTAM 3.375 G IVPB
3.3750 g | Freq: Three times a day (TID) | INTRAVENOUS | Status: DC
  Administered 2016-12-17 – 2016-12-19 (×6): 3.375 g via INTRAVENOUS
  Filled 2016-12-17 (×5): qty 50

## 2016-12-17 NOTE — Progress Notes (Signed)
Subjective: Patient states that he feels a little bit better, but still has lower abdominal pain. He has had multiple bowel movements. No nausea or vomiting.  Objective: Vital signs in last 24 hours: Temp:  [98 F (36.7 C)-99.1 F (37.3 C)] 99.1 F (37.3 C) (09/06 0543) Pulse Rate:  [65] 65 (09/05 1500) Resp:  [18] 18 (09/06 0543) BP: (126-157)/(52-70) 126/52 (09/06 0543) SpO2:  [89 %-100 %] 100 % (09/06 0543) Last BM Date: 12/16/16  Intake/Output from previous day: 09/05 0701 - 09/06 0700 In: 600 [P.O.:600] Out: 2100 [Urine:2100] Intake/Output this shift: No intake/output data recorded.  General appearance: alert, cooperative and no distress GI: Soft with active bowel sounds. No rigidity noted. Discomfort to palpation in the suprapubic and left lower quadrant regions.  Lab Results:   Recent Labs  12/16/16 0631 12/17/16 0648  WBC 18.5* 17.2*  HGB 13.6 13.6  HCT 41.2 40.2  PLT 148* 176   BMET  Recent Labs  12/16/16 0631 12/17/16 0648  NA 134* 136  K 4.0 3.6  CL 101 101  CO2 23 25  GLUCOSE 94 80  BUN 21* 18  CREATININE 0.66 0.65  CALCIUM 8.6* 8.9   PT/INR No results for input(s): LABPROT, INR in the last 72 hours.  Studies/Results: Ct Abdomen Pelvis Wo Contrast  Result Date: 12/15/2016 CLINICAL DATA:  Abdominal pain, diverticulitis suspected EXAM: CT ABDOMEN AND PELVIS WITHOUT CONTRAST TECHNIQUE: Multidetector CT imaging of the abdomen and pelvis was performed following the standard protocol without IV contrast. COMPARISON:  None. FINDINGS: Lower chest: Lung bases are clear. No effusions. Heart is normal size. Calcifications in the visualize coronary arteries. Hepatobiliary: Scattered calcifications in the liver compatible with old granulomatous disease. Prior cholecystectomy. Pancreas: No focal abnormality or ductal dilatation. Spleen: No focal abnormality.  Normal size. Adrenals/Urinary Tract: Bilateral renal parapelvic cysts. No hydronephrosis. Two  calcifications noted in the bladder or possibly at the left ureterovesical junction. These are punctate 1-2 mm stones. Bilateral nonobstructing renal stones. Adrenal glands unremarkable. Stomach/Bowel: Extensive diverticular disease in the sigmoid colon. Extensive stranding and extraluminal gas noted throughout the pelvis adjacent to the sigmoid colon, extending superiorly in the retroperitoneum compatible with perforation. No fluid collection. No evidence of bowel obstruction. Vascular/Lymphatic: Aortic and iliac calcifications. No aneurysm or adenopathy. Reproductive: Prostate radiation seeds in place. Other: No free fluid. Musculoskeletal: Degenerative changes in the lumbar spine. No acute bony abnormality. IMPRESSION: Sigmoid diverticulosis with extensive changes of active diverticulitis and moderate to large amount of extraluminal gas in the soft tissues surrounding the sigmoid colon and dissecting superiorly in the retroperitoneum compatible with bowel perforation. Bilateral nephrolithiasis. Punctate 1-2 mm stones within the bladder or possibly at the left ureterovesical junction. No hydronephrosis. Aortoiliac atherosclerosis. Coronary artery disease. Electronically Signed   By: Rolm Baptise M.D.   On: 12/15/2016 10:56    Anti-infectives: Anti-infectives    Start     Dose/Rate Route Frequency Ordered Stop   12/15/16 1600  Ampicillin-Sulbactam (UNASYN) 3 g in sodium chloride 0.9 % 100 mL IVPB     3 g 200 mL/hr over 30 Minutes Intravenous Every 6 hours 12/15/16 1453     12/15/16 1130  piperacillin-tazobactam (ZOSYN) IVPB 3.375 g     3.375 g 100 mL/hr over 30 Minutes Intravenous  Once 12/15/16 1115 12/15/16 1209      Assessment/Plan: Impression: Perforated sigmoid diverticulitis, resolving leukocytosis Plan: Continue current management. Will repeat CT scan of abdomen and pelvis tomorrow.  LOS: 2 days    Aviva Signs 12/17/2016

## 2016-12-17 NOTE — Progress Notes (Signed)
Pharmacy Antibiotic Note  Kyle [name]. is a 82 y.o. male admitted on 12/15/2016 with intra abdominal infection.  Pharmacy has been consulted for unasyn dosing, now change to zosyn  Plan: Zosyn 3.375 gm IV q8 hours F/u renal function, cultures and clinical course  Height: 5\' 8"  (172.7 cm) Weight: 154 lb (69.9 kg) (AFter breakfast and lunch time) IBW/kg (Calculated) : 68.4  Temp (24hrs), Avg:98.6 F (37 C), Min:98 F (36.7 C), Max:99.1 F (37.3 C)   Recent Labs Lab 12/15/16 1031 12/16/16 0631 12/17/16 0648  WBC 21.1* 18.5* 17.2*  CREATININE 0.82 0.66 0.65    Estimated Creatinine Clearance: 71.3 mL/min (by C-G formula based on SCr of 0.65 mg/dL).    Allergies  Allergen Reactions  . Bactericin [Bacitracin] Other (See Comments)    Unknown reaction   . Iohexol      Code: HIVES, Desc: pt was premedicated/hives reaction occurred 24 hours post injection, Onset Date: [id number]   . Ivp Dye [Iodinated Diagnostic Agents]   . Sulfa Antibiotics Swelling and Rash     Thank you for allowing pharmacy to be a part of this patient's care.  Excell Seltzer Poteet 12/17/2016 3:42 PM

## 2016-12-17 NOTE — Progress Notes (Addendum)
[facility] PROGRESS NOTE  Kyle [name]. YNW:[id number] DOB: December 31, 1934 DOA: 12/15/2016   PCP: Redmond School, MD     Assessment/Plan: Principal Problem:   Perforation and abscess of large intestine concurrent with and due to diverticulitis Active Problems:   Myasthenia ([place])   Convulsions/seizures ([place])   Essential hypertension, benign   Bowel perforation ([place])     82 yo m PMH myasthenia gravis, prostate cancer, presented to the emergency department with abdominal pain. CT revealed perforated diverticulitis without evidence of peritonitis. He was assessed by the general surgeon, and medical admission was recommended, treatment with antibiotics and close observation.  Assessment and plan Perforated sigmoid diverticulitis without abscess or peritonitis. Tolerating clear liquids, advance to soft diet, leukocytosis improving, clinically improving GI panel positive for Enterobacter and Escherichia coli , c diff negative  Repeat CT in am  Change Unasyn to Zosyn  Myasthenia gravis. -Stable. Continue pyridostigmine.  Essential hypertension. -Stable. Currently bradycardic.  Continue to hold metoprolol  Seizure disorder, status post resection of meningioma. Last seizure approximately 12 years ago. -Continue Keppra   DVT prophylaxsis Lovenox  Code Status:   Full code    Family Communication: Discussed in detail with the patient, all imaging results, lab results explained to the patient   Disposition Plan:  1-2 days      Consultants:  Surgery  Procedures:  None    Antibiotics: Anti-infectives    Start     Dose/Rate Route Frequency Ordered Stop   12/15/16 1600  Ampicillin-Sulbactam (UNASYN) 3 g in sodium chloride 0.9 % 100 mL IVPB     3 g 200 mL/hr over 30 Minutes Intravenous Every 6 hours 12/15/16 1453     12/15/16 1130  piperacillin-tazobactam (ZOSYN) IVPB 3.375 g     3.375 g 100 mL/hr over 30 Minutes Intravenous  Once 12/15/16 1115  12/15/16 1209         HPI/Subjective: Patient complains of having  7  bowel movements yesterday  Objective: Vitals:   12/16/16 1500 12/16/16 2010 12/16/16 2200 12/17/16 0543  BP: 135/60  (!) 157/70 (!) 126/52  Pulse: 65     Resp: 18  18 18   Temp: 98 F (36.7 C)  98 F (36.7 C) 99.1 F (37.3 C)  TempSrc: Oral  Oral Oral  SpO2: 97% 96%  100%  Weight:      Height:        Intake/Output Summary (Last 24 hours) at 12/17/16 1008 Last data filed at 12/17/16 0544  Gross per 24 hour  Intake              600 ml  Output             2100 ml  Net            -1500 ml    Exam:  Examination:  General exam: Appears calm and comfortable  Respiratory system: Clear to auscultation. Respiratory effort normal. Cardiovascular system: S1 & S2 heard, RRR. No JVD, murmurs, rubs, gallops or clicks. No pedal edema. Gastrointestinal system: Abdomen is nondistended, soft and nontender. No organomegaly or masses felt. Normal bowel sounds heard. Central nervous system: Alert and oriented. No focal neurological deficits. Extremities: Symmetric 5 x 5 power. Skin: No rashes, lesions or ulcers Psychiatry: Judgement and insight appear normal. Mood & affect appropriate.     Data Reviewed: I have personally reviewed following labs and imaging studies  Micro Results Recent Results (from the past 240 hour(s))  Urine Culture  Status: Abnormal   Collection Time: 12/15/16  9:51 AM  Result Value Ref Range Status   Specimen Description URINE, CLEAN CATCH  Final   Special Requests NONE  Final   Culture (A)  Final    <10,000 COLONIES/mL INSIGNIFICANT GROWTH Performed at [facility], [address] [place], [place] [address]    Report Status 12/17/2016 FINAL  Final  C difficile quick scan w PCR reflex     Status: None   Collection Time: 12/16/16  1:11 PM  Result Value Ref Range Status   C Diff antigen NEGATIVE NEGATIVE Final   C Diff toxin NEGATIVE NEGATIVE Final   C Diff interpretation  No C. difficile detected.  Final    Radiology Reports Ct Abdomen Pelvis Wo Contrast  Result Date: 12/15/2016 CLINICAL DATA:  Abdominal pain, diverticulitis suspected EXAM: CT ABDOMEN AND PELVIS WITHOUT CONTRAST TECHNIQUE: Multidetector CT imaging of the abdomen and pelvis was performed following the standard protocol without IV contrast. COMPARISON:  None. FINDINGS: Lower chest: Lung bases are clear. No effusions. Heart is normal size. Calcifications in the visualize coronary arteries. Hepatobiliary: Scattered calcifications in the liver compatible with old granulomatous disease. Prior cholecystectomy. Pancreas: No focal abnormality or ductal dilatation. Spleen: No focal abnormality.  Normal size. Adrenals/Urinary Tract: Bilateral renal parapelvic cysts. No hydronephrosis. Two calcifications noted in the bladder or possibly at the left ureterovesical junction. These are punctate 1-2 mm stones. Bilateral nonobstructing renal stones. Adrenal glands unremarkable. Stomach/Bowel: Extensive diverticular disease in the sigmoid colon. Extensive stranding and extraluminal gas noted throughout the pelvis adjacent to the sigmoid colon, extending superiorly in the retroperitoneum compatible with perforation. No fluid collection. No evidence of bowel obstruction. Vascular/Lymphatic: Aortic and iliac calcifications. No aneurysm or adenopathy. Reproductive: Prostate radiation seeds in place. Other: No free fluid. Musculoskeletal: Degenerative changes in the lumbar spine. No acute bony abnormality. IMPRESSION: Sigmoid diverticulosis with extensive changes of active diverticulitis and moderate to large amount of extraluminal gas in the soft tissues surrounding the sigmoid colon and dissecting superiorly in the retroperitoneum compatible with bowel perforation. Bilateral nephrolithiasis. Punctate 1-2 mm stones within the bladder or possibly at the left ureterovesical junction. No hydronephrosis. Aortoiliac atherosclerosis.  Coronary artery disease. Electronically Signed   By: Rolm Baptise M.D.   On: 12/15/2016 10:56     CBC  Recent Labs Lab 12/15/16 1031 12/16/16 0631 12/17/16 0648  WBC 21.1* 18.5* 17.2*  HGB 13.4 13.6 13.6  HCT 40.7 41.2 40.2  PLT 147* 148* 176  MCV 95.8 95.6 93.7  MCH 31.5 31.6 31.7  MCHC 32.9 33.0 33.8  RDW 13.4 13.7 13.5  LYMPHSABS 0.8  --   --   MONOABS 1.5*  --   --   EOSABS 0.0  --   --   BASOSABS 0.0  --   --     Chemistries   Recent Labs Lab 12/15/16 1031 12/16/16 0631 12/17/16 0648  NA 133* 134* 136  K 4.7 4.0 3.6  CL 101 101 101  CO2 23 23 25   GLUCOSE 212* 94 80  BUN 26* 21* 18  CREATININE 0.82 0.66 0.65  CALCIUM 8.8* 8.6* 8.9  AST 40  --  22  ALT 64*  --  37  ALKPHOS 68  --  68  BILITOT 1.7*  --  1.1   ------------------------------------------------------------------------------------------------------------------ estimated creatinine clearance is 71.3 mL/min (by C-G formula based on SCr of 0.65 mg/dL). ------------------------------------------------------------------------------------------------------------------ No results for input(s): HGBA1C in the last 72 hours. ------------------------------------------------------------------------------------------------------------------ No results for  input(s): CHOL, HDL, LDLCALC, TRIG, CHOLHDL, LDLDIRECT in the last 72 hours. ------------------------------------------------------------------------------------------------------------------ No results for input(s): TSH, T4TOTAL, T3FREE, THYROIDAB in the last 72 hours.  Invalid input(s): FREET3 ------------------------------------------------------------------------------------------------------------------ No results for input(s): VITAMINB12, FOLATE, FERRITIN, TIBC, IRON, RETICCTPCT in the last 72 hours.  Coagulation profile No results for input(s): INR, PROTIME in the last 168 hours.  No results for input(s): DDIMER in the last 72 hours.  Cardiac  Enzymes No results for input(s): CKMB, TROPONINI, MYOGLOBIN in the last 168 hours.  Invalid input(s): CK ------------------------------------------------------------------------------------------------------------------ Invalid input(s): POCBNP   CBG: No results for input(s): GLUCAP in the last 168 hours.     Studies: Ct Abdomen Pelvis Wo Contrast  Result Date: 12/15/2016 CLINICAL DATA:  Abdominal pain, diverticulitis suspected EXAM: CT ABDOMEN AND PELVIS WITHOUT CONTRAST TECHNIQUE: Multidetector CT imaging of the abdomen and pelvis was performed following the standard protocol without IV contrast. COMPARISON:  None. FINDINGS: Lower chest: Lung bases are clear. No effusions. Heart is normal size. Calcifications in the visualize coronary arteries. Hepatobiliary: Scattered calcifications in the liver compatible with old granulomatous disease. Prior cholecystectomy. Pancreas: No focal abnormality or ductal dilatation. Spleen: No focal abnormality.  Normal size. Adrenals/Urinary Tract: Bilateral renal parapelvic cysts. No hydronephrosis. Two calcifications noted in the bladder or possibly at the left ureterovesical junction. These are punctate 1-2 mm stones. Bilateral nonobstructing renal stones. Adrenal glands unremarkable. Stomach/Bowel: Extensive diverticular disease in the sigmoid colon. Extensive stranding and extraluminal gas noted throughout the pelvis adjacent to the sigmoid colon, extending superiorly in the retroperitoneum compatible with perforation. No fluid collection. No evidence of bowel obstruction. Vascular/Lymphatic: Aortic and iliac calcifications. No aneurysm or adenopathy. Reproductive: Prostate radiation seeds in place. Other: No free fluid. Musculoskeletal: Degenerative changes in the lumbar spine. No acute bony abnormality. IMPRESSION: Sigmoid diverticulosis with extensive changes of active diverticulitis and moderate to large amount of extraluminal gas in the soft tissues  surrounding the sigmoid colon and dissecting superiorly in the retroperitoneum compatible with bowel perforation. Bilateral nephrolithiasis. Punctate 1-2 mm stones within the bladder or possibly at the left ureterovesical junction. No hydronephrosis. Aortoiliac atherosclerosis. Coronary artery disease. Electronically Signed   By: Rolm Baptise M.D.   On: 12/15/2016 10:56      Lab Results  Component Value Date   HGBA1C 5.9 (H) 10/25/2014   Lab Results  Component Value Date   LDLCALC 89 10/26/2014   CREATININE 0.65 12/17/2016       Scheduled Meds: . enoxaparin (LOVENOX) injection  40 mg Subcutaneous Q24H  . gabapentin  400 mg Oral QHS  . levETIRAcetam  250 mg Oral q morning - 10a  . levETIRAcetam  500 mg Oral QHS  . losartan  25 mg Oral QHS  . niacinamide  500 mg Oral TID  . predniSONE  20 mg Oral Q breakfast  . pyridostigmine  90 mg Oral TID  . saccharomyces boulardii  250 mg Oral BID  . simvastatin  20 mg Oral QHS   Continuous Infusions: . sodium chloride 75 mL/hr at 12/17/16 0953  . ampicillin-sulbactam (UNASYN) IV 3 g (12/17/16 0953)     LOS: 2 days    Time spent: >30 MINS    Reyne Dumas  [facility] Pager [phone]. If 7PM-7AM, please contact night-coverage at [email or web address], password [facility] [place] 12/17/2016, 10:08 AM  LOS: 2 days

## 2016-12-18 ENCOUNTER — Inpatient Hospital Stay (HOSPITAL_COMMUNITY): Payer: Medicare Other

## 2016-12-18 DIAGNOSIS — R561 Post traumatic seizures: Secondary | ICD-10-CM

## 2016-12-18 MED ORDER — BARIUM SULFATE 2.1 % PO SUSP
ORAL | Status: AC
  Administered 2016-12-18: 08:00:00
  Filled 2016-12-18: qty 2

## 2016-12-18 NOTE — Progress Notes (Signed)
[facility] PROGRESS NOTE  Kyle [name]. DTO:[id number] DOB: 1935-03-01 DOA: 12/15/2016   PCP: Redmond School, MD     Assessment/Plan: Principal Problem:   Perforation and abscess of large intestine concurrent with and due to diverticulitis Active Problems:   Myasthenia ([place])   Convulsions/seizures ([place])   Essential hypertension, benign   Bowel perforation ([place])     82 yo m PMH myasthenia gravis, prostate cancer, presented to the emergency department with abdominal pain. CT revealed perforated diverticulitis without evidence of peritonitis. He was assessed by the general surgeon, and medical admission was recommended, treatment with antibiotics and close observation.  Assessment and plan Perforated sigmoid diverticulitis without abscess or peritonitis. Clinically improving, tolerating soft diet GI panel positive for Enterobacter and Escherichia coli , c diff negative  Repeat CT 9/7 showed perforated sigmoid diverticulitis, mild interval worsening, no well-defined drainable abscess Continue Zosyn, may discharge home on Cipro/Flagyl for 10 days Anticipate discharge in 24-48 hours  Myasthenia gravis. -Stable. Continue pyridostigmine.  Essential hypertension. -Stable. Currently bradycardic.  Continue to hold metoprolol  Seizure disorder, status post resection of meningioma. Last seizure approximately 12 years ago. -Continue Keppra   DVT prophylaxsis Lovenox  Code Status:   Full code    Family Communication: Discussed in detail with the patient, all imaging results, lab results explained to the patient   Disposition Plan:  1-2 days      Consultants:  Surgery  Procedures:  None    Antibiotics: Anti-infectives    Start     Dose/Rate Route Frequency Ordered Stop   12/17/16 1600  piperacillin-tazobactam (ZOSYN) IVPB 3.375 g     3.375 g 12.5 mL/hr over 240 Minutes Intravenous Every 8 hours 12/17/16 1542     12/15/16 1600   Ampicillin-Sulbactam (UNASYN) 3 g in sodium chloride 0.9 % 100 mL IVPB  Status:  Discontinued     3 g 200 mL/hr over 30 Minutes Intravenous Every 6 hours 12/15/16 1453 12/17/16 1513   12/15/16 1130  piperacillin-tazobactam (ZOSYN) IVPB 3.375 g     3.375 g 100 mL/hr over 30 Minutes Intravenous  Once 12/15/16 1115 12/15/16 1209         HPI/Subjective: Patient complains of having  4-5  bowel movements , They appear to be more formed  Objective: Vitals:   12/17/16 1050 12/17/16 1342 12/17/16 2011 12/18/16 0541  BP:  (!) 108/53  (!) 117/54  Pulse:  62  (!) 55  Resp:  16  16  Temp:  98.7 F (37.1 C)  98.8 F (37.1 C)  TempSrc:  Oral  Oral  SpO2: 100% 99% 91% 100%  Weight:      Height:        Intake/Output Summary (Last 24 hours) at 12/18/16 1153 Last data filed at 12/18/16 0842  Gross per 24 hour  Intake              770 ml  Output             1500 ml  Net             -730 ml    Exam:  Examination:  General exam: Appears calm and comfortable  Respiratory system: Clear to auscultation. Respiratory effort normal. Cardiovascular system: S1 & S2 heard, RRR. No JVD, murmurs, rubs, gallops or clicks. No pedal edema. Gastrointestinal system: Abdomen is nondistended, soft and nontender. No organomegaly or masses felt. Normal bowel sounds heard. Central nervous system: Alert and oriented. No focal  neurological deficits. Extremities: Symmetric 5 x 5 power. Skin: No rashes, lesions or ulcers Psychiatry: Judgement and insight appear normal. Mood & affect appropriate.     Data Reviewed: I have personally reviewed following labs and imaging studies  Micro Results Recent Results (from the past 240 hour(s))  Urine Culture     Status: Abnormal   Collection Time: 12/15/16  9:51 AM  Result Value Ref Range Status   Specimen Description URINE, CLEAN CATCH  Final   Special Requests NONE  Final   Culture (A)  Final    <10,000 COLONIES/mL INSIGNIFICANT GROWTH Performed at [facility], [address] [place], [place] [address]    Report Status 12/17/2016 FINAL  Final  Gastrointestinal Panel by PCR , Stool     Status: Abnormal   Collection Time: 12/16/16  1:11 PM  Result Value Ref Range Status   Campylobacter species NOT DETECTED NOT DETECTED Final   Plesimonas shigelloides NOT DETECTED NOT DETECTED Final   Salmonella species NOT DETECTED NOT DETECTED Final   Yersinia enterocolitica NOT DETECTED NOT DETECTED Final   Vibrio species NOT DETECTED NOT DETECTED Final   Vibrio cholerae NOT DETECTED NOT DETECTED Final   Enteroaggregative E coli (EAEC) NOT DETECTED NOT DETECTED Final   Enteropathogenic E coli (EPEC) DETECTED (A) NOT DETECTED Final    Comment: RESULT CALLED TO, READ BACK BY AND VERIFIED WITH: LAURA BIVENS 12/17/16 1503 KLW    Enterotoxigenic E coli (ETEC) NOT DETECTED NOT DETECTED Final   Shiga like toxin producing E coli (STEC) NOT DETECTED NOT DETECTED Final   Shigella/Enteroinvasive E coli (EIEC) NOT DETECTED NOT DETECTED Final   Cryptosporidium NOT DETECTED NOT DETECTED Final   Cyclospora cayetanensis NOT DETECTED NOT DETECTED Final   Entamoeba histolytica NOT DETECTED NOT DETECTED Final   Giardia lamblia NOT DETECTED NOT DETECTED Final   Adenovirus F40/41 NOT DETECTED NOT DETECTED Final   Astrovirus NOT DETECTED NOT DETECTED Final   Norovirus GI/GII NOT DETECTED NOT DETECTED Final   Rotavirus A NOT DETECTED NOT DETECTED Final   Sapovirus (I, II, IV, and V) NOT DETECTED NOT DETECTED Final  C difficile quick scan w PCR reflex     Status: None   Collection Time: 12/16/16  1:11 PM  Result Value Ref Range Status   C Diff antigen NEGATIVE NEGATIVE Final   C Diff toxin NEGATIVE NEGATIVE Final   C Diff interpretation No C. difficile detected.  Final    Radiology Reports Ct Abdomen Pelvis Wo Contrast  Result Date: 12/18/2016 CLINICAL DATA:  Inpatient. Recent diagnosis of perforated sigmoid diverticulitis, now presenting with abdominal pain and  fever. EXAM: CT ABDOMEN AND PELVIS WITHOUT CONTRAST TECHNIQUE: Multidetector CT imaging of the abdomen and pelvis was performed following the standard protocol without IV contrast. COMPARISON:  12/15/2016 CT abdomen/ pelvis. FINDINGS: Lower chest: No significant pulmonary nodules or acute consolidative airspace disease. Coronary atherosclerosis. Hepatobiliary: Normal liver size. Stable granulomatous inferior right liver lobe calcification. No liver mass. Cholecystectomy. No biliary ductal dilatation. Pancreas: Normal, with no mass or duct dilation. Spleen: Normal size. No mass. Adrenals/Urinary Tract: Normal adrenals. Nonobstructing 3 mm interpolar right renal stone. Nonobstructing 4 mm interpolar left renal stone. Small simple parapelvic renal cysts in both kidneys, unchanged. No hydronephrosis. No contour deforming renal mass. Normal bladder. Stomach/Bowel: Grossly normal stomach. Normal caliber small bowel with no small bowel wall thickening. The appendix is mildly dilated (9 mm diameter), unchanged. There is new mild wall thickening in the distal appendix, probably reactive.  Moderate sigmoid diverticulosis. Mild segmental wall thickening throughout the mid sigmoid colon. There is extensive phlegmonous change in the sigmoid mesentery with extensive pericolonic free air, ill-defined fluid and fat stranding. The amount of free air is slightly decreased in the interval, particularly in the upper perirectal and para-aortic regions. The amount of fat stranding and ill-defined fluid in the sigmoid mesentery is increased. No well-defined measurable focal fluid collection. Oral contrast progresses to the ascending colon. No additional sites of large bowel wall thickening. Vascular/Lymphatic: Atherosclerotic nonaneurysmal abdominal aorta. No pathologically enlarged lymph nodes in the abdomen or pelvis. Reproductive: Normal size prostate. Brachytherapy seeds are again noted in the prostate. Other: No ascites.  Musculoskeletal: No aggressive appearing focal osseous lesions. Marked lumbar spondylosis. IMPRESSION: 1. Perforated sigmoid diverticulitis, with mild interval worsening of extensive phlegmonous changes in the sigmoid mesentery. No well-defined measurable/drainable abscess at this time. 2. Additional findings include Aortic Atherosclerosis (ICD10-I70.0), coronary atherosclerosis and nonobstructing bilateral nephrolithiasis. Electronically Signed   By: Ilona Sorrel M.D.   On: 12/18/2016 10:55   Ct Abdomen Pelvis Wo Contrast  Result Date: 12/15/2016 CLINICAL DATA:  Abdominal pain, diverticulitis suspected EXAM: CT ABDOMEN AND PELVIS WITHOUT CONTRAST TECHNIQUE: Multidetector CT imaging of the abdomen and pelvis was performed following the standard protocol without IV contrast. COMPARISON:  None. FINDINGS: Lower chest: Lung bases are clear. No effusions. Heart is normal size. Calcifications in the visualize coronary arteries. Hepatobiliary: Scattered calcifications in the liver compatible with old granulomatous disease. Prior cholecystectomy. Pancreas: No focal abnormality or ductal dilatation. Spleen: No focal abnormality.  Normal size. Adrenals/Urinary Tract: Bilateral renal parapelvic cysts. No hydronephrosis. Two calcifications noted in the bladder or possibly at the left ureterovesical junction. These are punctate 1-2 mm stones. Bilateral nonobstructing renal stones. Adrenal glands unremarkable. Stomach/Bowel: Extensive diverticular disease in the sigmoid colon. Extensive stranding and extraluminal gas noted throughout the pelvis adjacent to the sigmoid colon, extending superiorly in the retroperitoneum compatible with perforation. No fluid collection. No evidence of bowel obstruction. Vascular/Lymphatic: Aortic and iliac calcifications. No aneurysm or adenopathy. Reproductive: Prostate radiation seeds in place. Other: No free fluid. Musculoskeletal: Degenerative changes in the lumbar spine. No acute bony  abnormality. IMPRESSION: Sigmoid diverticulosis with extensive changes of active diverticulitis and moderate to large amount of extraluminal gas in the soft tissues surrounding the sigmoid colon and dissecting superiorly in the retroperitoneum compatible with bowel perforation. Bilateral nephrolithiasis. Punctate 1-2 mm stones within the bladder or possibly at the left ureterovesical junction. No hydronephrosis. Aortoiliac atherosclerosis. Coronary artery disease. Electronically Signed   By: Rolm Baptise M.D.   On: 12/15/2016 10:56     CBC  Recent Labs Lab 12/15/16 1031 12/16/16 0631 12/17/16 0648  WBC 21.1* 18.5* 17.2*  HGB 13.4 13.6 13.6  HCT 40.7 41.2 40.2  PLT 147* 148* 176  MCV 95.8 95.6 93.7  MCH 31.5 31.6 31.7  MCHC 32.9 33.0 33.8  RDW 13.4 13.7 13.5  LYMPHSABS 0.8  --   --   MONOABS 1.5*  --   --   EOSABS 0.0  --   --   BASOSABS 0.0  --   --     Chemistries   Recent Labs Lab 12/15/16 1031 12/16/16 0631 12/17/16 0648  NA 133* 134* 136  K 4.7 4.0 3.6  CL 101 101 101  CO2 23 23 25   GLUCOSE 212* 94 80  BUN 26* 21* 18  CREATININE 0.82 0.66 0.65  CALCIUM 8.8* 8.6* 8.9  AST 40  --  22  ALT 64*  --  37  ALKPHOS 68  --  68  BILITOT 1.7*  --  1.1   ------------------------------------------------------------------------------------------------------------------ estimated creatinine clearance is 71.3 mL/min (by C-G formula based on SCr of 0.65 mg/dL). ------------------------------------------------------------------------------------------------------------------ No results for input(s): HGBA1C in the last 72 hours. ------------------------------------------------------------------------------------------------------------------ No results for input(s): CHOL, HDL, LDLCALC, TRIG, CHOLHDL, LDLDIRECT in the last 72 hours. ------------------------------------------------------------------------------------------------------------------ No results for input(s): TSH,  T4TOTAL, T3FREE, THYROIDAB in the last 72 hours.  Invalid input(s): FREET3 ------------------------------------------------------------------------------------------------------------------ No results for input(s): VITAMINB12, FOLATE, FERRITIN, TIBC, IRON, RETICCTPCT in the last 72 hours.  Coagulation profile No results for input(s): INR, PROTIME in the last 168 hours.  No results for input(s): DDIMER in the last 72 hours.  Cardiac Enzymes No results for input(s): CKMB, TROPONINI, MYOGLOBIN in the last 168 hours.  Invalid input(s): CK ------------------------------------------------------------------------------------------------------------------ Invalid input(s): POCBNP   CBG: No results for input(s): GLUCAP in the last 168 hours.     Studies: Ct Abdomen Pelvis Wo Contrast  Result Date: 12/18/2016 CLINICAL DATA:  Inpatient. Recent diagnosis of perforated sigmoid diverticulitis, now presenting with abdominal pain and fever. EXAM: CT ABDOMEN AND PELVIS WITHOUT CONTRAST TECHNIQUE: Multidetector CT imaging of the abdomen and pelvis was performed following the standard protocol without IV contrast. COMPARISON:  12/15/2016 CT abdomen/ pelvis. FINDINGS: Lower chest: No significant pulmonary nodules or acute consolidative airspace disease. Coronary atherosclerosis. Hepatobiliary: Normal liver size. Stable granulomatous inferior right liver lobe calcification. No liver mass. Cholecystectomy. No biliary ductal dilatation. Pancreas: Normal, with no mass or duct dilation. Spleen: Normal size. No mass. Adrenals/Urinary Tract: Normal adrenals. Nonobstructing 3 mm interpolar right renal stone. Nonobstructing 4 mm interpolar left renal stone. Small simple parapelvic renal cysts in both kidneys, unchanged. No hydronephrosis. No contour deforming renal mass. Normal bladder. Stomach/Bowel: Grossly normal stomach. Normal caliber small bowel with no small bowel wall thickening. The appendix is mildly dilated  (9 mm diameter), unchanged. There is new mild wall thickening in the distal appendix, probably reactive. Moderate sigmoid diverticulosis. Mild segmental wall thickening throughout the mid sigmoid colon. There is extensive phlegmonous change in the sigmoid mesentery with extensive pericolonic free air, ill-defined fluid and fat stranding. The amount of free air is slightly decreased in the interval, particularly in the upper perirectal and para-aortic regions. The amount of fat stranding and ill-defined fluid in the sigmoid mesentery is increased. No well-defined measurable focal fluid collection. Oral contrast progresses to the ascending colon. No additional sites of large bowel wall thickening. Vascular/Lymphatic: Atherosclerotic nonaneurysmal abdominal aorta. No pathologically enlarged lymph nodes in the abdomen or pelvis. Reproductive: Normal size prostate. Brachytherapy seeds are again noted in the prostate. Other: No ascites. Musculoskeletal: No aggressive appearing focal osseous lesions. Marked lumbar spondylosis. IMPRESSION: 1. Perforated sigmoid diverticulitis, with mild interval worsening of extensive phlegmonous changes in the sigmoid mesentery. No well-defined measurable/drainable abscess at this time. 2. Additional findings include Aortic Atherosclerosis (ICD10-I70.0), coronary atherosclerosis and nonobstructing bilateral nephrolithiasis. Electronically Signed   By: Ilona Sorrel M.D.   On: 12/18/2016 10:55      Lab Results  Component Value Date   HGBA1C 5.9 (H) 10/25/2014   Lab Results  Component Value Date   LDLCALC 89 10/26/2014   CREATININE 0.65 12/17/2016       Scheduled Meds: . enoxaparin (LOVENOX) injection  40 mg Subcutaneous Q24H  . gabapentin  400 mg Oral QHS  . levETIRAcetam  250 mg Oral q morning - 10a  . levETIRAcetam  500 mg Oral QHS  . losartan  25 mg Oral QHS  .  niacinamide  500 mg Oral TID  . predniSONE  20 mg Oral Q breakfast  . pyridostigmine  90 mg Oral TID  .  saccharomyces boulardii  250 mg Oral BID  . simvastatin  20 mg Oral QHS   Continuous Infusions: . sodium chloride 10 mL/hr at 12/18/16 1118  . piperacillin-tazobactam (ZOSYN)  IV 3.375 g (12/18/16 0820)     LOS: 3 days    Time spent: >30 MINS    Reyne Dumas  [facility] Pager [phone]. If 7PM-7AM, please contact night-coverage at [email or web address], password [facility] 12/18/2016, 11:53 AM  LOS: 3 days

## 2016-12-18 NOTE — Care Management Note (Signed)
Case Management Note  Patient Details  Name: Kyle [name]. MRN: [id number] Date of Birth: 1934-11-27  Subjective/Objective:                  Admitted with diverticulitis and perforations. Pt from home with wife. Very active at baseline. No HH, no DME. Pt not homebound. Pt has PCP, transportation and insurance with drug coverage. Pt communicates no needs or concerns about DC plan.  Action/Plan: DC home with self care.   Expected Discharge Date:  12/17/16               Expected Discharge Plan:  Home/Self Care  In-House Referral:  NA  Discharge planning Services  CM Consult  Post Acute Care Choice:  NA Choice offered to:  NA  Status of Service:  Completed, signed off  Sherald Barge, RN 12/18/2016, 8:01 AM

## 2016-12-18 NOTE — Progress Notes (Signed)
Subjective: Still with mild abdominal discomfort and bloating. Patient had a soft diet this morning.  Objective: Vital signs in last 24 hours: Temp:  [98.7 F (37.1 C)-98.8 F (37.1 C)] 98.8 F (37.1 C) (09/07 0541) Pulse Rate:  [55-62] 55 (09/07 0541) Resp:  [16] 16 (09/07 0541) BP: (108-117)/(53-54) 117/54 (09/07 0541) SpO2:  [91 %-100 %] 100 % (09/07 0541) Last BM Date: 12/16/16  Intake/Output from previous day: 09/06 0701 - 09/07 0700 In: 750 [P.O.:600; IV Piggyback:150] Out: 1500 [Urine:1500] Intake/Output this shift: Total I/O In: 240 [P.O.:240] Out: -   General appearance: alert, cooperative and no distress GI: Soft with slight distention noted. Minimal suprapubic discomfort to palpation noted. No rigidity noted. Bowel sounds active. CT scan images personally reviewed. Lab Results:   Recent Labs  12/16/16 0631 12/17/16 0648  WBC 18.5* 17.2*  HGB 13.6 13.6  HCT 41.2 40.2  PLT 148* 176   BMET  Recent Labs  12/16/16 0631 12/17/16 0648  NA 134* 136  K 4.0 3.6  CL 101 101  CO2 23 25  GLUCOSE 94 80  BUN 21* 18  CREATININE 0.66 0.65  CALCIUM 8.6* 8.9   PT/INR No results for input(s): LABPROT, INR in the last 72 hours.  Studies/Results: Ct Abdomen Pelvis Wo Contrast  Result Date: 12/18/2016 CLINICAL DATA:  Inpatient. Recent diagnosis of perforated sigmoid diverticulitis, now presenting with abdominal pain and fever. EXAM: CT ABDOMEN AND PELVIS WITHOUT CONTRAST TECHNIQUE: Multidetector CT imaging of the abdomen and pelvis was performed following the standard protocol without IV contrast. COMPARISON:  12/15/2016 CT abdomen/ pelvis. FINDINGS: Lower chest: No significant pulmonary nodules or acute consolidative airspace disease. Coronary atherosclerosis. Hepatobiliary: Normal liver size. Stable granulomatous inferior right liver lobe calcification. No liver mass. Cholecystectomy. No biliary ductal dilatation. Pancreas: Normal, with no mass or duct dilation.  Spleen: Normal size. No mass. Adrenals/Urinary Tract: Normal adrenals. Nonobstructing 3 mm interpolar right renal stone. Nonobstructing 4 mm interpolar left renal stone. Small simple parapelvic renal cysts in both kidneys, unchanged. No hydronephrosis. No contour deforming renal mass. Normal bladder. Stomach/Bowel: Grossly normal stomach. Normal caliber small bowel with no small bowel wall thickening. The appendix is mildly dilated (9 mm diameter), unchanged. There is new mild wall thickening in the distal appendix, probably reactive. Moderate sigmoid diverticulosis. Mild segmental wall thickening throughout the mid sigmoid colon. There is extensive phlegmonous change in the sigmoid mesentery with extensive pericolonic free air, ill-defined fluid and fat stranding. The amount of free air is slightly decreased in the interval, particularly in the upper perirectal and para-aortic regions. The amount of fat stranding and ill-defined fluid in the sigmoid mesentery is increased. No well-defined measurable focal fluid collection. Oral contrast progresses to the ascending colon. No additional sites of large bowel wall thickening. Vascular/Lymphatic: Atherosclerotic nonaneurysmal abdominal aorta. No pathologically enlarged lymph nodes in the abdomen or pelvis. Reproductive: Normal size prostate. Brachytherapy seeds are again noted in the prostate. Other: No ascites. Musculoskeletal: No aggressive appearing focal osseous lesions. Marked lumbar spondylosis. IMPRESSION: 1. Perforated sigmoid diverticulitis, with mild interval worsening of extensive phlegmonous changes in the sigmoid mesentery. No well-defined measurable/drainable abscess at this time. 2. Additional findings include Aortic Atherosclerosis (ICD10-I70.0), coronary atherosclerosis and nonobstructing bilateral nephrolithiasis. Electronically Signed   By: Ilona Sorrel M.D.   On: 12/18/2016 10:55    Anti-infectives: Anti-infectives    Start     Dose/Rate Route  Frequency Ordered Stop   12/17/16 1600  piperacillin-tazobactam (ZOSYN) IVPB 3.375 g     3.375  g 12.5 mL/hr over 240 Minutes Intravenous Every 8 hours 12/17/16 1542     12/15/16 1600  Ampicillin-Sulbactam (UNASYN) 3 g in sodium chloride 0.9 % 100 mL IVPB  Status:  Discontinued     3 g 200 mL/hr over 30 Minutes Intravenous Every 6 hours 12/15/16 1453 12/17/16 1513   12/15/16 1130  piperacillin-tazobactam (ZOSYN) IVPB 3.375 g     3.375 g 100 mL/hr over 30 Minutes Intravenous  Once 12/15/16 1115 12/15/16 1209      Assessment/Plan: Impression: Sigmoid diverticulitis with resolved retroperitoneal air. Patient now has significant inflammation of the mesentery. There is no discrete abscess. No pneumoperitoneum. White blood cell count down to 17,000. Was started on a soft diet. Plan: Would continue IV antibiotics for next 24 hours. Should he tolerate the soft diet and his white blood cell count continued to trend downward, may be able to discharge in next 24-48 hours. No need for acute surgical intervention.  LOS: 3 days    Aviva Signs 12/18/2016

## 2016-12-18 NOTE — Care Management Important Message (Signed)
Important Message  Patient Details  Name: Kyle [name]. MRN: [id number] Date of Birth: [birth year]   Medicare Important Message Given:  Yes    Sherald Barge, RN 12/18/2016, 9:56 AM

## 2016-12-19 LAB — COMPREHENSIVE METABOLIC PANEL
ALBUMIN: 3.3 g/dL — AB (ref 3.5–5.0)
ALT: 36 U/L (ref 17–63)
ANION GAP: 11 (ref 5–15)
AST: 23 U/L (ref 15–41)
Alkaline Phosphatase: 69 U/L (ref 38–126)
BUN: 17 mg/dL (ref 6–20)
CALCIUM: 9.1 mg/dL (ref 8.9–10.3)
CHLORIDE: 103 mmol/L (ref 101–111)
CO2: 25 mmol/L (ref 22–32)
Creatinine, Ser: 0.72 mg/dL (ref 0.61–1.24)
GFR calc Af Amer: >60 mL/min (ref >60)
GFR calc non Af Amer: >60 mL/min (ref >60)
GLUCOSE: 82 mg/dL (ref 65–99)
POTASSIUM: 3.4 mmol/L — AB (ref 3.5–5.1)
SODIUM: 139 mmol/L (ref 135–145)
Total Bilirubin: 0.7 mg/dL (ref 0.3–1.2)
Total Protein: 7.5 g/dL (ref 6.5–8.1)

## 2016-12-19 LAB — CBC
HCT: 39.2 % (ref 39.0–52.0)
HEMOGLOBIN: 13.2 g/dL (ref 13.0–17.0)
MCH: 31.3 pg (ref 26.0–34.0)
MCHC: 33.7 g/dL (ref 30.0–36.0)
MCV: 92.9 fL (ref 78.0–100.0)
PLATELETS: 219 10*3/uL (ref 150–400)
RBC: 4.22 MIL/uL (ref 4.22–5.81)
RDW: 13.3 % (ref 11.5–15.5)
WBC: 14.4 10*3/uL — ABNORMAL HIGH (ref 4.0–10.5)

## 2016-12-19 MED ORDER — CIPROFLOXACIN HCL 250 MG PO TABS: 500.0000 mg | ORAL_TABLET | Freq: Two times a day (BID) | ORAL | Status: DC

## 2016-12-19 MED ORDER — CIPROFLOXACIN HCL 500 MG PO TABS: 500.0000 mg | ORAL_TABLET | Freq: Two times a day (BID) | ORAL | 0 refills | Status: DC

## 2016-12-19 MED ORDER — METRONIDAZOLE 500 MG PO TABS: 500.0000 mg | ORAL_TABLET | Freq: Three times a day (TID) | ORAL | 0 refills | Status: DC

## 2016-12-19 MED ORDER — METRONIDAZOLE IN NACL 5-0.79 MG/ML-% IV SOLN
500.0000 mg | Freq: Three times a day (TID) | INTRAVENOUS | Status: DC
Start: 2016-12-19 — End: 2016-12-19

## 2016-12-19 NOTE — Discharge Instructions (Signed)

## 2016-12-19 NOTE — Progress Notes (Signed)
Subjective: Patient denies any abdominal pain. Tolerating diet well.  Objective: Vital signs in last 24 hours: Temp:  [97.9 F (36.6 C)-98.5 F (36.9 C)] 98.4 F (36.9 C) (09/08 0615) Pulse Rate:  [60-65] 64 (09/08 0615) Resp:  [16] 16 (09/08 0615) BP: (126-140)/(68-72) 126/68 (09/08 0615) SpO2:  [98 %-100 %] 100 % (09/08 0615) Weight:  [164 lb 11.6 oz (74.7 kg)] 164 lb 11.6 oz (74.7 kg) (09/08 0615) Last BM Date: 12/19/16 (per pt)  Intake/Output from previous day: 09/07 0701 - 09/08 0700 In: 480 [P.O.:480] Out: 2200 [Urine:2200] Intake/Output this shift: Total I/O In: 240 [P.O.:240] Out: -   General appearance: alert, cooperative and no distress GI: soft, non-tender; bowel sounds normal; no masses,  no organomegaly  Lab Results:   Recent Labs  12/17/16 0648 12/19/16 0639  WBC 17.2* 14.4*  HGB 13.6 13.2  HCT 40.2 39.2  PLT 176 219   BMET  Recent Labs  12/17/16 0648 12/19/16 0639  NA 136 139  K 3.6 3.4*  CL 101 103  CO2 25 25  GLUCOSE 80 82  BUN 18 17  CREATININE 0.65 0.72  CALCIUM 8.9 9.1   PT/INR No results for input(s): LABPROT, INR in the last 72 hours.  Studies/Results: Ct Abdomen Pelvis Wo Contrast  Result Date: 12/18/2016 CLINICAL DATA:  Inpatient. Recent diagnosis of perforated sigmoid diverticulitis, now presenting with abdominal pain and fever. EXAM: CT ABDOMEN AND PELVIS WITHOUT CONTRAST TECHNIQUE: Multidetector CT imaging of the abdomen and pelvis was performed following the standard protocol without IV contrast. COMPARISON:  12/15/2016 CT abdomen/ pelvis. FINDINGS: Lower chest: No significant pulmonary nodules or acute consolidative airspace disease. Coronary atherosclerosis. Hepatobiliary: Normal liver size. Stable granulomatous inferior right liver lobe calcification. No liver mass. Cholecystectomy. No biliary ductal dilatation. Pancreas: Normal, with no mass or duct dilation. Spleen: Normal size. No mass. Adrenals/Urinary Tract: Normal  adrenals. Nonobstructing 3 mm interpolar right renal stone. Nonobstructing 4 mm interpolar left renal stone. Small simple parapelvic renal cysts in both kidneys, unchanged. No hydronephrosis. No contour deforming renal mass. Normal bladder. Stomach/Bowel: Grossly normal stomach. Normal caliber small bowel with no small bowel wall thickening. The appendix is mildly dilated (9 mm diameter), unchanged. There is new mild wall thickening in the distal appendix, probably reactive. Moderate sigmoid diverticulosis. Mild segmental wall thickening throughout the mid sigmoid colon. There is extensive phlegmonous change in the sigmoid mesentery with extensive pericolonic free air, ill-defined fluid and fat stranding. The amount of free air is slightly decreased in the interval, particularly in the upper perirectal and para-aortic regions. The amount of fat stranding and ill-defined fluid in the sigmoid mesentery is increased. No well-defined measurable focal fluid collection. Oral contrast progresses to the ascending colon. No additional sites of large bowel wall thickening. Vascular/Lymphatic: Atherosclerotic nonaneurysmal abdominal aorta. No pathologically enlarged lymph nodes in the abdomen or pelvis. Reproductive: Normal size prostate. Brachytherapy seeds are again noted in the prostate. Other: No ascites. Musculoskeletal: No aggressive appearing focal osseous lesions. Marked lumbar spondylosis. IMPRESSION: 1. Perforated sigmoid diverticulitis, with mild interval worsening of extensive phlegmonous changes in the sigmoid mesentery. No well-defined measurable/drainable abscess at this time. 2. Additional findings include Aortic Atherosclerosis (ICD10-I70.0), coronary atherosclerosis and nonobstructing bilateral nephrolithiasis. Electronically Signed   By: Ilona Sorrel M.D.   On: 12/18/2016 10:55    Anti-infectives: Anti-infectives    Start     Dose/Rate Route Frequency Ordered Stop   12/17/16 1600   piperacillin-tazobactam (ZOSYN) IVPB 3.375 g     3.375 g  12.5 mL/hr over 240 Minutes Intravenous Every 8 hours 12/17/16 1542     12/15/16 1600  Ampicillin-Sulbactam (UNASYN) 3 g in sodium chloride 0.9 % 100 mL IVPB  Status:  Discontinued     3 g 200 mL/hr over 30 Minutes Intravenous Every 6 hours 12/15/16 1453 12/17/16 1513   12/15/16 1130  piperacillin-tazobactam (ZOSYN) IVPB 3.375 g     3.375 g 100 mL/hr over 30 Minutes Intravenous  Once 12/15/16 1115 12/15/16 1209      Assessment/Plan: Impression: Perforated sigmoid diverticulitis, resolving. Follow-up CT scan showed no abscess. Plan: Okay for discharge on ciprofloxacin and Flagyl for a total of 2 weeks. I will see the patient in my office in 2 weeks.  LOS: 4 days    Aviva Signs 12/19/2016

## 2016-12-19 NOTE — Discharge Summary (Signed)
DISCHARGE SUMMARY  Kyle [name].  MR#: [id number]  DOB:[birth year]  Date of Admission: 12/15/2016 Date of Discharge: 12/19/2016  Attending Physician:[name],Kyle T  Patient's BUL:AGTXM, Kyle Nails, MD  Consults:  Gen Surgery   Disposition: D/C home   Follow-up Appts: Follow-up Information    Kyle Signs, MD. Schedule an appointment as soon as possible for a visit on 12/31/2016.   Specialty:  General Surgery Contact information: [id number] [place] [address] [phone]          Discharge Diagnoses: Perforation and abscess of large intestine due to diverticulitis Myasthenia gravis Essential hypertension  Initial presentation: 82yo M w/ a hx of myasthenia gravis and prostate cancer who presented to the ED with abdominal pain. CT revealed a perforated diverticulitis without evidence of peritonitis. He was assessed by the General Surgeon, and medical admission was recommended.  Hospital Course: CT at admission confirmed a perforated sigmoid diverticulitis without evidence of abscess or Aivan peritonitis.  The patient's diet was initially limited and he was placed on empiric antibiotic therapy.  General Surgery followed in consultation.  As the patient improved clinically his diet was able to be advanced.  A follow-up CT abdomen was accomplished and there remained no evidence of an abscess.  At the time of his discharge he is tolerating a soft diet without abdominal pain or other complications.  The patient is to complete a 2 week course of ciprofloxacin and Flagyl.  He will follow up with general surgery in 2 weeks.  Fortunately his myasthenia gravis remained stable throughout his hospital stay.  No other acute medical complications were encountered during this hospitalization.  Allergies as of 12/19/2016      Reactions   Bactericin [bacitracin] Other (See Comments)   Unknown reaction    Iohexol     Code: HIVES, Desc: pt was premedicated/hives reaction  occurred 24 hours post injection, Onset Date: [id number]   Ivp Dye [iodinated Diagnostic Agents]    Sulfa Antibiotics Swelling, Rash      Medication List    TAKE these medications   atenolol 25 MG tablet Commonly known as:  TENORMIN TAKE 1 TABLET BY MOUTH  DAILY   ciprofloxacin 500 MG tablet Commonly known as:  CIPRO Take 1 tablet (500 mg total) by mouth 2 (two) times daily.   clobetasol 0.05 % external solution Commonly known as:  TEMOVATE Apply 1 application topically daily as needed (rash).   diclofenac 75 MG EC tablet Commonly known as:  VOLTAREN TAKE 1 TABLET BY MOUTH TWO  TIMES DAILY   fish oil-omega-3 fatty acids 1000 MG capsule Take 1 g by mouth 3 (three) times daily.   gabapentin 400 MG capsule Commonly known as:  NEURONTIN Take 1 capsule (400 mg total) by mouth at bedtime.   GARLIQUE PO Take 1 tablet by mouth daily.   levETIRAcetam 500 MG tablet Commonly known as:  KEPPRA TAKE 1/2 TABLET BY MOUTH IN THE MORNING AND 1 TABLET IN THE EVENING   losartan 25 MG tablet Commonly known as:  COZAAR Take 25 mg by mouth at bedtime.   MAGOX 400 400 (241.3 Mg) MG tablet Generic drug:  magnesium oxide Take 800 mg by mouth daily.   metroNIDAZOLE 500 MG tablet Commonly known as:  FLAGYL Take 1 tablet (500 mg total) by mouth 3 (three) times daily.   niacinamide 500 MG tablet Take 500 mg by mouth 3 (three) times daily.   predniSONE 5 MG tablet Commonly known as:  DELTASONE Take 2 tablets (10 mg  total) by mouth daily with breakfast.   pyridostigmine 60 MG tablet Commonly known as:  MESTINON Take 1.5 tablets (90 mg total) by mouth 3 (three) times daily.   simvastatin 20 MG tablet Commonly known as:  ZOCOR Take 20 mg by mouth at bedtime.   SYSTANE 0.4-0.3 % Soln Generic drug:  Polyethyl Glycol-Propyl Glycol Place 1 drop into both eyes 3 (three) times daily as needed (for dry eyes).            Discharge Care Instructions        Start     Ordered    12/19/16 0000  ciprofloxacin (CIPRO) 500 MG tablet  2 times daily     12/19/16 1435   12/19/16 0000  metroNIDAZOLE (FLAGYL) 500 MG tablet  3 times daily     12/19/16 1435   12/19/16 0000  Increase activity slowly     12/19/16 1435      Day of Discharge BP 122/68 (BP Location: Left Arm)   Pulse 68   Temp 97.6 F (36.4 C) (Oral)   Resp 16   Ht 5\' 8"  (1.727 m)   Wt 74.7 kg (164 lb 11.6 oz)   SpO2 97%   BMI 25.05 kg/m   Physical Exam: General: No acute respiratory distress Lungs: Clear to auscultation bilaterally without wheezes or crackles Cardiovascular: Regular rate and rhythm without murmur gallop or rub normal S1 and S2 Abdomen: Nontender, nondistended, soft, bowel sounds positive, no rebound, no ascites, no appreciable mass Extremities: No significant cyanosis, clubbing, or edema bilateral lower extremities  Basic Metabolic Panel:  Recent Labs Lab 12/15/16 1031 12/16/16 0631 12/17/16 0648 12/19/16 0639  NA 133* 134* 136 139  K 4.7 4.0 3.6 3.4*  CL 101 101 101 103  CO2 23 23 25 25   GLUCOSE 212* 94 80 82  BUN 26* 21* 18 17  CREATININE 0.82 0.66 0.65 0.72  CALCIUM 8.8* 8.6* 8.9 9.1    Liver Function Tests:  Recent Labs Lab 12/15/16 1031 12/17/16 0648 12/19/16 0639  AST 40 22 23  ALT 64* 37 36  ALKPHOS 68 68 69  BILITOT 1.7* 1.1 0.7  PROT 7.1 7.3 7.5  ALBUMIN 3.5 3.2* 3.3*    CBC:  Recent Labs Lab 12/15/16 1031 12/16/16 0631 12/17/16 0648 12/19/16 0639  WBC 21.1* 18.5* 17.2* 14.4*  NEUTROABS 18.8*  --   --   --   HGB 13.4 13.6 13.6 13.2  HCT 40.7 41.2 40.2 39.2  MCV 95.8 95.6 93.7 92.9  PLT 147* 148* 176 219    Recent Results (from the past 240 hour(s))  Urine Culture     Status: Abnormal   Collection Time: 12/15/16  9:51 AM  Result Value Ref Range Status   Specimen Description URINE, CLEAN CATCH  Final   Special Requests NONE  Final   Culture (A)  Final    <10,000 COLONIES/mL INSIGNIFICANT GROWTH Performed at [facility],  [address], [place], [place] [address]    Report Status 12/17/2016 FINAL  Final  Gastrointestinal Panel by PCR , Stool     Status: Abnormal   Collection Time: 12/16/16  1:11 PM  Result Value Ref Range Status   Campylobacter species NOT DETECTED NOT DETECTED Final   Plesimonas shigelloides NOT DETECTED NOT DETECTED Final   Salmonella species NOT DETECTED NOT DETECTED Final   Yersinia enterocolitica NOT DETECTED NOT DETECTED Final   Vibrio species NOT DETECTED NOT DETECTED Final   Vibrio cholerae NOT DETECTED NOT DETECTED  Final   Enteroaggregative E coli (EAEC) NOT DETECTED NOT DETECTED Final   Enteropathogenic E coli (EPEC) DETECTED (A) NOT DETECTED Final    Comment: RESULT CALLED TO, READ BACK BY AND VERIFIED WITH: LAURA BIVENS 12/17/16 1503 KLW    Enterotoxigenic E coli (ETEC) NOT DETECTED NOT DETECTED Final   Shiga like toxin producing E coli (STEC) NOT DETECTED NOT DETECTED Final   Shigella/Enteroinvasive E coli (EIEC) NOT DETECTED NOT DETECTED Final   Cryptosporidium NOT DETECTED NOT DETECTED Final   Cyclospora cayetanensis NOT DETECTED NOT DETECTED Final   Entamoeba histolytica NOT DETECTED NOT DETECTED Final   Giardia lamblia NOT DETECTED NOT DETECTED Final   Adenovirus F40/41 NOT DETECTED NOT DETECTED Final   Astrovirus NOT DETECTED NOT DETECTED Final   Norovirus GI/GII NOT DETECTED NOT DETECTED Final   Rotavirus A NOT DETECTED NOT DETECTED Final   Sapovirus (I, II, IV, and V) NOT DETECTED NOT DETECTED Final  C difficile quick scan w PCR reflex     Status: None   Collection Time: 12/16/16  1:11 PM  Result Value Ref Range Status   C Diff antigen NEGATIVE NEGATIVE Final   C Diff toxin NEGATIVE NEGATIVE Final   C Diff interpretation No C. difficile detected.  Final     Time spent in discharge (includes decision making & examination of pt): 25 minutes  12/19/2016, 2:36 PM   Cherene Altes, MD [facility] Office  [phone] Pager [phone]  On-Call/Text  Page:      [email or web address]      password [facility]

## 2016-12-19 NOTE — Progress Notes (Signed)
Pt IV removed, tolerated well.  Reviewed discharge instructions with pt and family at bedside, answered all questions at this time.  Pt discharged to lobby via wheelchair by NT with all belongings.

## 2016-12-27 ENCOUNTER — Inpatient Hospital Stay (HOSPITAL_COMMUNITY)
Admission: EM | Admit: 2016-12-27 | Discharge: 2016-12-30 | DRG: 392 | Disposition: A | Discharge status: Home / Self Care | Payer: Medicare Other | Attending: Internal Medicine | Admitting: Internal Medicine

## 2016-12-27 ENCOUNTER — Emergency Department (HOSPITAL_COMMUNITY): Payer: Medicare Other

## 2016-12-27 ENCOUNTER — Encounter (HOSPITAL_COMMUNITY): Payer: Self-pay

## 2016-12-27 DIAGNOSIS — I10 Essential (primary) hypertension: Secondary | ICD-10-CM | POA: Diagnosis not present

## 2016-12-27 DIAGNOSIS — K631 Perforation of intestine (nontraumatic): Secondary | ICD-10-CM | POA: Diagnosis present

## 2016-12-27 DIAGNOSIS — G40909 Epilepsy, unspecified, not intractable, without status epilepticus: Secondary | ICD-10-CM | POA: Diagnosis present

## 2016-12-27 DIAGNOSIS — Z888 Allergy status to other drugs, medicaments and biological substances status: Secondary | ICD-10-CM

## 2016-12-27 DIAGNOSIS — Z883 Allergy status to other anti-infective agents status: Secondary | ICD-10-CM | POA: Diagnosis not present

## 2016-12-27 DIAGNOSIS — K5720 Diverticulitis of large intestine with perforation and abscess without bleeding: Secondary | ICD-10-CM | POA: Diagnosis not present

## 2016-12-27 DIAGNOSIS — M1991 Primary osteoarthritis, unspecified site: Secondary | ICD-10-CM | POA: Diagnosis present

## 2016-12-27 DIAGNOSIS — R001 Bradycardia, unspecified: Secondary | ICD-10-CM | POA: Diagnosis not present

## 2016-12-27 DIAGNOSIS — Z86718 Personal history of other venous thrombosis and embolism: Secondary | ICD-10-CM

## 2016-12-27 DIAGNOSIS — Z882 Allergy status to sulfonamides status: Secondary | ICD-10-CM

## 2016-12-27 DIAGNOSIS — Z86011 Personal history of benign neoplasm of the brain: Secondary | ICD-10-CM | POA: Diagnosis not present

## 2016-12-27 DIAGNOSIS — K5732 Diverticulitis of large intestine without perforation or abscess without bleeding: Secondary | ICD-10-CM | POA: Diagnosis not present

## 2016-12-27 DIAGNOSIS — K5792 Diverticulitis of intestine, part unspecified, without perforation or abscess without bleeding: Secondary | ICD-10-CM | POA: Diagnosis present

## 2016-12-27 DIAGNOSIS — E785 Hyperlipidemia, unspecified: Secondary | ICD-10-CM | POA: Diagnosis not present

## 2016-12-27 DIAGNOSIS — K651 Peritoneal abscess: Secondary | ICD-10-CM | POA: Diagnosis not present

## 2016-12-27 DIAGNOSIS — G7000 Myasthenia gravis without (acute) exacerbation: Secondary | ICD-10-CM | POA: Diagnosis present

## 2016-12-27 DIAGNOSIS — N200 Calculus of kidney: Secondary | ICD-10-CM | POA: Diagnosis present

## 2016-12-27 DIAGNOSIS — Z91041 Radiographic dye allergy status: Secondary | ICD-10-CM

## 2016-12-27 DIAGNOSIS — K5780 Diverticulitis of intestine, part unspecified, with perforation and abscess without bleeding: Secondary | ICD-10-CM | POA: Diagnosis not present

## 2016-12-27 LAB — COMPREHENSIVE METABOLIC PANEL
ALK PHOS: 51 U/L (ref 38–126)
ALT: 27 U/L (ref 17–63)
AST: 29 U/L (ref 15–41)
Albumin: 2.9 g/dL — ABNORMAL LOW (ref 3.5–5.0)
Anion gap: 10 (ref 5–15)
BUN: 18 mg/dL (ref 6–20)
CALCIUM: 8.9 mg/dL (ref 8.9–10.3)
CO2: 24 mmol/L (ref 22–32)
CREATININE: 0.87 mg/dL (ref 0.61–1.24)
Chloride: 102 mmol/L (ref 101–111)
Glucose, Bld: 206 mg/dL — ABNORMAL HIGH (ref 65–99)
Potassium: 4 mmol/L (ref 3.5–5.1)
Sodium: 136 mmol/L (ref 135–145)
Total Bilirubin: 0.4 mg/dL (ref 0.3–1.2)
Total Protein: 6.6 g/dL (ref 6.5–8.1)

## 2016-12-27 LAB — CBC WITH DIFFERENTIAL/PLATELET
BASOS ABS: 0 10*3/uL (ref 0.0–0.1)
Basophils Relative: 0 %
EOS ABS: 0.1 10*3/uL (ref 0.0–0.7)
EOS PCT: 1 %
HCT: 38 % — ABNORMAL LOW (ref 39.0–52.0)
Hemoglobin: 12.6 g/dL — ABNORMAL LOW (ref 13.0–17.0)
Lymphocytes Relative: 5 %
Lymphs Abs: 0.9 10*3/uL (ref 0.7–4.0)
MCH: 31 pg (ref 26.0–34.0)
MCHC: 33.2 g/dL (ref 30.0–36.0)
MCV: 93.6 fL (ref 78.0–100.0)
Monocytes Absolute: 1.4 10*3/uL — ABNORMAL HIGH (ref 0.1–1.0)
Monocytes Relative: 8 %
Neutro Abs: 16.7 10*3/uL — ABNORMAL HIGH (ref 1.7–7.7)
Neutrophils Relative %: 86 %
PLATELETS: 269 10*3/uL (ref 150–400)
RBC: 4.06 MIL/uL — AB (ref 4.22–5.81)
RDW: 13.6 % (ref 11.5–15.5)
WBC: 19.2 10*3/uL — AB (ref 4.0–10.5)

## 2016-12-27 LAB — URINALYSIS, ROUTINE W REFLEX MICROSCOPIC
BILIRUBIN URINE: NEGATIVE
Bacteria, UA: NONE SEEN
Glucose, UA: NEGATIVE mg/dL
HGB URINE DIPSTICK: NEGATIVE
Ketones, ur: NEGATIVE mg/dL
Nitrite: NEGATIVE
PH: 5 (ref 5.0–8.0)
Protein, ur: NEGATIVE mg/dL
SPECIFIC GRAVITY, URINE: 1.02 (ref 1.005–1.030)
Squamous Epithelial / LPF: NONE SEEN

## 2016-12-27 LAB — LIPASE, BLOOD: LIPASE: 36 U/L (ref 11–51)

## 2016-12-27 MED ORDER — ACETAMINOPHEN 325 MG PO TABS: 650.0000 mg | ORAL_TABLET | Freq: Four times a day (QID) | ORAL | Status: DC | PRN

## 2016-12-27 MED ORDER — GABAPENTIN 400 MG PO CAPS
400.0000 mg | ORAL_CAPSULE | Freq: Every day | ORAL | Status: DC
  Administered 2016-12-27 – 2016-12-30 (×3): 400 mg via ORAL
  Filled 2016-12-27 (×3): qty 1

## 2016-12-27 MED ORDER — PREDNISONE 10 MG PO TABS
10.0000 mg | ORAL_TABLET | Freq: Every day | ORAL | Status: DC
  Administered 2016-12-28 – 2016-12-30 (×3): 10 mg via ORAL
  Filled 2016-12-27 (×3): qty 1

## 2016-12-27 MED ORDER — POLYETHYL GLYCOL-PROPYL GLYCOL 0.4-0.3 % OP SOLN: 1.0000 [drp] | Freq: Three times a day (TID) | OPHTHALMIC | Status: DC | PRN

## 2016-12-27 MED ORDER — ONDANSETRON HCL 4 MG PO TABS: 4.0000 mg | ORAL_TABLET | Freq: Four times a day (QID) | ORAL | Status: DC | PRN

## 2016-12-27 MED ORDER — LEVETIRACETAM 500 MG PO TABS: 500.0000 mg | ORAL_TABLET | Freq: Every day | ORAL | Status: DC

## 2016-12-27 MED ORDER — PIPERACILLIN-TAZOBACTAM 3.375 G IVPB 30 MIN: 3.3750 g | Freq: Three times a day (TID) | INTRAVENOUS | Status: DC

## 2016-12-27 MED ORDER — ONDANSETRON HCL 4 MG/2ML IJ SOLN: 4.0000 mg | Freq: Four times a day (QID) | INTRAMUSCULAR | Status: DC | PRN

## 2016-12-27 MED ORDER — ENOXAPARIN SODIUM 40 MG/0.4ML ~~LOC~~ SOLN
40.0000 mg | SUBCUTANEOUS | Status: DC
  Administered 2016-12-27: 40 mg via SUBCUTANEOUS
  Filled 2016-12-27: qty 0.4

## 2016-12-27 MED ORDER — SODIUM CHLORIDE 0.9 % IV BOLUS (SEPSIS)
500.0000 mL | Freq: Once | INTRAVENOUS | Status: AC
  Administered 2016-12-27: 500 mL via INTRAVENOUS

## 2016-12-27 MED ORDER — OMEGA-3-ACID ETHYL ESTERS 1 G PO CAPS
1.0000 g | ORAL_CAPSULE | Freq: Three times a day (TID) | ORAL | Status: DC
  Administered 2016-12-27 – 2016-12-30 (×9): 1 g via ORAL
  Filled 2016-12-27 (×9): qty 1

## 2016-12-27 MED ORDER — NIACINAMIDE 500 MG PO TABS
500.0000 mg | ORAL_TABLET | Freq: Three times a day (TID) | ORAL | Status: DC
  Filled 2016-12-27 (×9): qty 1

## 2016-12-27 MED ORDER — MAGNESIUM OXIDE 400 (241.3 MG) MG PO TABS
800.0000 mg | ORAL_TABLET | Freq: Every day | ORAL | Status: DC
  Administered 2016-12-28 – 2016-12-30 (×3): 800 mg via ORAL
  Filled 2016-12-27 (×3): qty 2

## 2016-12-27 MED ORDER — LEVETIRACETAM 250 MG PO TABS
250.0000 mg | ORAL_TABLET | Freq: Every day | ORAL | Status: DC
  Administered 2016-12-28 – 2016-12-30 (×3): 250 mg via ORAL
  Filled 2016-12-27 (×3): qty 1

## 2016-12-27 MED ORDER — PIPERACILLIN-TAZOBACTAM 3.375 G IVPB
3.3750 g | Freq: Three times a day (TID) | INTRAVENOUS | Status: DC
  Administered 2016-12-27 – 2016-12-30 (×8): 3.375 g via INTRAVENOUS
  Filled 2016-12-27 (×8): qty 50

## 2016-12-27 MED ORDER — POLYVINYL ALCOHOL 1.4 % OP SOLN: 1.0000 [drp] | OPHTHALMIC | Status: DC | PRN

## 2016-12-27 MED ORDER — PYRIDOSTIGMINE BROMIDE 60 MG PO TABS
90.0000 mg | ORAL_TABLET | Freq: Three times a day (TID) | ORAL | Status: DC
  Administered 2016-12-27 – 2016-12-30 (×9): 90 mg via ORAL
  Filled 2016-12-27 (×15): qty 1.5

## 2016-12-27 MED ORDER — LEVETIRACETAM 500 MG PO TABS
500.0000 mg | ORAL_TABLET | Freq: Every day | ORAL | Status: DC
  Administered 2016-12-27 – 2016-12-30 (×3): 500 mg via ORAL
  Filled 2016-12-27 (×3): qty 1

## 2016-12-27 MED ORDER — SIMVASTATIN 20 MG PO TABS
20.0000 mg | ORAL_TABLET | Freq: Every day | ORAL | Status: DC
  Administered 2016-12-27 – 2016-12-30 (×3): 20 mg via ORAL
  Filled 2016-12-27 (×3): qty 1

## 2016-12-27 MED ORDER — OMEGA-3 FATTY ACIDS 1000 MG PO CAPS: 1.0000 g | ORAL_CAPSULE | Freq: Three times a day (TID) | ORAL | Status: DC

## 2016-12-27 MED ORDER — NIACINAMIDE 500 MG PO TABS: 500.0000 mg | ORAL_TABLET | Freq: Three times a day (TID) | ORAL | Status: DC

## 2016-12-27 MED ORDER — PIPERACILLIN-TAZOBACTAM 3.375 G IVPB 30 MIN
3.3750 g | Freq: Once | INTRAVENOUS | Status: AC
  Administered 2016-12-27: 3.375 g via INTRAVENOUS
  Filled 2016-12-27: qty 50

## 2016-12-27 MED ORDER — ACETAMINOPHEN 650 MG RE SUPP: 650.0000 mg | Freq: Four times a day (QID) | RECTAL | Status: DC | PRN

## 2016-12-27 MED ORDER — POTASSIUM CHLORIDE IN NACL 20-0.9 MEQ/L-% IV SOLN
INTRAVENOUS | Status: DC
  Administered 2016-12-27 – 2016-12-30 (×4): via INTRAVENOUS

## 2016-12-27 MED ORDER — LOSARTAN POTASSIUM 50 MG PO TABS
25.0000 mg | ORAL_TABLET | Freq: Every day | ORAL | Status: DC
  Administered 2016-12-27 – 2016-12-30 (×3): 25 mg via ORAL
  Filled 2016-12-27 (×3): qty 1

## 2016-12-27 MED ORDER — FENTANYL CITRATE (PF) 100 MCG/2ML IJ SOLN
25.0000 ug | INTRAMUSCULAR | Status: DC | PRN
  Administered 2016-12-28: 25 ug via INTRAVENOUS
  Filled 2016-12-27: qty 2

## 2016-12-27 NOTE — ED Triage Notes (Signed)
Pt reports had diverticulitis and perforated colon and was admitted recently.  Reports was on IV antibiotics in the hospital and has been on po antibiotics at home.  Reports was supposed to see Dr. Arnoldo Morale this Thursday for follow up but pt having continuous lower abd pain.  Denies blood in stool, Denies n/v.   Reports diarrhea.

## 2016-12-27 NOTE — ED Provider Notes (Signed)
Emergency Department Provider Note   I have reviewed the triage vital signs and the nursing notes.   HISTORY  Chief Complaint Abdominal Pain   HPI Kyle [name]. is a 82 y.o. male with PMH of DVT, HLD, HTN, ocular myasthenia, and recent admission for perforated diverticulitis returns to the emergency department today with worsening lower abdominal pain. He describes pain primarily with having bowel movements but over the past 1-2 days has had worsening, constant lower abdominal pain. He describes it as occurring low in the abdomen with slight radiation to the right, as opposed to the left. He denies any fevers or chills. He is completing his antibiotics with the last dose of antiemetics plan for tomorrow. No nausea and vomiting. He denies any blood in the stool. He is having frequent bowel movements that are watery to semi-formed.   Past Medical History:  Diagnosis Date  . Arthritis   . Auto immune neutropenia (HCC)   . Bullous pemphigoid   . Cancer [facility])    Prostate  . Cervical spondylosis without myelopathy 01/22/2016  . DVT (deep venous thrombosis) ([place])    a. Has had a previous right lower extremity DVT in the setting of hospitalization and surgery (after his brain tumor was resected in 1993) but is no longer on Coumadin.  Marland Kitchen Dyslipidemia   . History of prostate cancer   . Hypertension   . Meningioma ([place])    a. s/p resection 1990s.  . Myasthenia ([place]) 02/14/2013  . Nocturnal leg cramps   . Ocular myasthenia gravis ([place])   . Seizures ([place])   . Sinus bradycardia   . Sleep paralysis   . Thyroid nodule, cold     Patient Active Problem List   Diagnosis Date Noted  . Acute diverticulitis of intestine 12/27/2016  . Bowel perforation ([place])   . Perforation and abscess of large intestine concurrent with and due to diverticulitis 12/15/2016  . Blood in stool   . Second degree hemorrhoids   . Diverticulosis of large intestine without diverticulitis   . Essential  hypertension, benign 02/03/2016  . Cervical spondylosis without myelopathy 01/22/2016  . Nocturnal leg cramps 07/24/2015  . Nontoxic multinodular goiter 01/31/2015  . Elevated transaminase level 10/26/2014  . Thrombocytopenia ([place]) 10/25/2014  . Myasthenia ([place]) 02/14/2013  . Convulsions/seizures ([place]) 02/14/2013    Past Surgical History:  Procedure Laterality Date  . BRAIN SURGERY    . CHOLECYSTECTOMY    . COLONOSCOPY N/A 07/25/2013   Procedure: COLONOSCOPY;  Surgeon: Jamesetta So, MD;  Location: [facility];  Service: Gastroenterology;  Laterality: N/A;  . COLONOSCOPY N/A 11/10/2016   Procedure: COLONOSCOPY;  Surgeon: Aviva Signs, MD;  Location: [facility] ENDO SUITE;  Service: Gastroenterology;  Laterality: N/A;  . RADIOACTIVE SEED IMPLANT        Allergies Bactericin [bacitracin]; Iohexol; Ivp dye [iodinated diagnostic agents]; and Sulfa antibiotics  Family History  Problem Relation Age of Onset  . Heart failure Mother   . Diabetes Mother   . Diabetes Sister   . Lung cancer Son   . Heart failure Daughter   . Colon cancer Neg Hx     Social History Social History  Substance Use Topics  . Smoking status: Never Smoker  . Smokeless tobacco: Never Used  . Alcohol use No    Review of Systems  Constitutional: No fever/chills Eyes: No visual changes. ENT: No sore throat. Cardiovascular: Denies chest pain. Respiratory: Denies shortness of breath. Gastrointestinal: Positive lower abdominal pain.  No  nausea, no vomiting.  No diarrhea.  No constipation. Genitourinary: Negative for dysuria. Musculoskeletal: Negative for back pain. Skin: Negative for rash. Neurological: Negative for headaches, focal weakness or numbness.  10-point ROS otherwise negative.  ____________________________________________   PHYSICAL EXAM:  VITAL SIGNS: ED Triage Vitals  Enc Vitals Group     BP 12/27/16 1026 (!) 142/66     Pulse Rate 12/27/16 1026 (!) 43     Resp 12/27/16 1026 18      Temp 12/27/16 1026 98.2 F (36.8 C)     Temp Source 12/27/16 1026 Oral     SpO2 12/27/16 1026 96 %     Weight 12/27/16 1025 160 lb (72.6 kg)     Height 12/27/16 1025 5\' 8"  (1.727 m)     Pain Score 12/27/16 1024 4   Constitutional: Alert and oriented. Well appearing and in no acute distress. Eyes: Conjunctivae are normal. Head: Atraumatic. Nose: No congestion/rhinnorhea. Mouth/Throat: Mucous membranes are moist.   Neck: No stridor.  Cardiovascular: Normal rate, regular rhythm. Good peripheral circulation. Grossly normal heart sounds.   Respiratory: Normal respiratory effort.  No retractions. Lungs CTAB. Gastrointestinal: Soft with diffuse lower abdominal tenderness. No rebound or guarding. No distention.  Musculoskeletal: No lower extremity tenderness nor edema. No gross deformities of extremities. Neurologic:  Normal speech and language. No gross focal neurologic deficits are appreciated.  Skin:  Skin is warm, dry and intact. No rash noted.  ____________________________________________   LABS (all labs ordered are listed, but only abnormal results are displayed)  Labs Reviewed  COMPREHENSIVE METABOLIC PANEL - Abnormal; Notable for the following:       Result Value   Glucose, Bld 206 (*)    Albumin 2.9 (*)    All other components within normal limits  CBC WITH DIFFERENTIAL/PLATELET - Abnormal; Notable for the following:    WBC 19.2 (*)    RBC 4.06 (*)    Hemoglobin 12.6 (*)    HCT 38.0 (*)    Neutro Abs 16.7 (*)    Monocytes Absolute 1.4 (*)    All other components within normal limits  URINALYSIS, ROUTINE W REFLEX MICROSCOPIC - Abnormal; Notable for the following:    Color, Urine AMBER (*)    Leukocytes, UA MODERATE (*)    All other components within normal limits  URINE CULTURE  LIPASE, BLOOD  BASIC METABOLIC PANEL  CBC  PROTIME-INR  APTT   ____________________________________________  RADIOLOGY  Ct Abdomen Pelvis Wo Contrast  Result Date: 12/27/2016 CLINICAL  DATA:  Diverticulitis with perforation. Persistent fever and abdominal pain. EXAM: CT ABDOMEN AND PELVIS WITHOUT CONTRAST TECHNIQUE: Multidetector CT imaging of the abdomen and pelvis was performed following the standard protocol without IV contrast. COMPARISON:  12/18/2016 FINDINGS: Lower chest: Normal Hepatobiliary: No liver lesion. Previous cholecystectomy. Benign hepatic calcifications unchanged. Pancreas: Normal Spleen: Normal Adrenals/Urinary Tract: Adrenal glands are normal. Kidneys show some cortical atrophy. Multiple small parapelvic cysts. Nonobstructing 2 mm stone in the right lower pole. Nonobstructing 4 mm stone in the left midportion. No evidence of passing stone. Bladder appears normal. Stomach/Bowel: No evidence of bowel obstruction. Advanced sigmoid diverticulitis with perforation. 3.8 cm abscess in the central pelvis. 5 cm region of less distinct inflammatory change with edema and gas. No evidence of cul de sac abscess. Vascular/Lymphatic: Aortic atherosclerosis. No aneurysm. IVC is normal. No retroperitoneal adenopathy. Reproductive: Multiple prostate seed implants. Other: No free fluid or air. Musculoskeletal: Chronic lumbar degenerative changes. IMPRESSION: Persistent sigmoid diverticulitis with perforation. 4cm abscess immediately adjacent. 5  cm region of phlegmonous inflammation with air bubbles and edema. Electronically Signed   By: Nelson Chimes M.D.   On: 12/27/2016 11:20    ____________________________________________   PROCEDURES  Procedure(s) performed:   Procedures   ____________________________________________   INITIAL IMPRESSION / ASSESSMENT AND PLAN / ED COURSE  Pertinent labs & imaging results that were available during my care of the patient were reviewed by me and considered in my medical decision making (see chart for details).  Patient presents to the emergency department for evaluation of continued/worsening lower abdominal pain with radiation slightly to  the right. No fevers or chills. The patient does have focal tenderness in the lower abdomen without rebound or guarding. Given his recent history of perforated diverticulitis plan for CT to evaluate for abscess formation.   12:17 PM Spoke with Dr. Arnoldo Morale who recommends admission with IV abx and monitoring. He will see in the AM. Question if the abscess is large enough for IR drainage. Dies sips and clear liquids at this time.   Discussed patient's case with Hospitalist to request admission. Patient and family (if present) updated with plan. Care transferred to Hospitalist service.  I reviewed all nursing notes, vitals, pertinent old records, EKGs, labs, imaging (as available).  ____________________________________________  FINAL CLINICAL IMPRESSION(S) / ED DIAGNOSES  Final diagnoses:  Intra-abdominal abscess ([place])     MEDICATIONS GIVEN DURING THIS VISIT:  Medications  magnesium oxide (MAG-OX) tablet 800 mg (800 mg Oral Not Given 12/27/16 1530)  predniSONE (DELTASONE) tablet 10 mg (not administered)  pyridostigmine (MESTINON) tablet 90 mg (90 mg Oral Given 12/27/16 1616)  losartan (COZAAR) tablet 25 mg (not administered)  simvastatin (ZOCOR) tablet 20 mg (not administered)  gabapentin (NEURONTIN) capsule 400 mg (not administered)  enoxaparin (LOVENOX) injection 40 mg (40 mg Subcutaneous Given 12/27/16 1616)  acetaminophen (TYLENOL) tablet 650 mg (not administered)    Or  acetaminophen (TYLENOL) suppository 650 mg (not administered)  ondansetron (ZOFRAN) tablet 4 mg (not administered)    Or  ondansetron (ZOFRAN) injection 4 mg (not administered)  0.9 % NaCl with KCl 20 mEq/ L  infusion ( Intravenous New Bag/Given 12/27/16 1613)  fentaNYL (SUBLIMAZE) injection 25 mcg (not administered)  polyvinyl alcohol (LIQUIFILM TEARS) 1.4 % ophthalmic solution 1 drop (not administered)  levETIRAcetam (KEPPRA) tablet 500 mg (not administered)  omega-3 acid ethyl esters (LOVAZA) capsule 1 g (1 g  Oral Given 12/27/16 1616)  niacinamide tablet 500 mg (500 mg Oral Not Given 12/27/16 1700)  piperacillin-tazobactam (ZOSYN) IVPB 3.375 g (not administered)  levETIRAcetam (KEPPRA) tablet 250 mg (not administered)  sodium chloride 0.9 % bolus 500 mL (0 mLs Intravenous Stopped 12/27/16 1230)  piperacillin-tazobactam (ZOSYN) IVPB 3.375 g (0 g Intravenous Stopped 12/27/16 1324)  sodium chloride 0.9 % bolus 500 mL (0 mLs Intravenous Stopped 12/27/16 1310)     NEW OUTPATIENT MEDICATIONS STARTED DURING THIS VISIT:  None   Note:  This document was prepared using Dragon voice recognition software and may include unintentional dictation errors.  Nanda Quinton, MD Emergency Medicine    Long, [facility], MD 12/27/16 2004

## 2016-12-28 DIAGNOSIS — I10 Essential (primary) hypertension: Secondary | ICD-10-CM

## 2016-12-28 DIAGNOSIS — K631 Perforation of intestine (nontraumatic): Secondary | ICD-10-CM

## 2016-12-28 DIAGNOSIS — G7000 Myasthenia gravis without (acute) exacerbation: Secondary | ICD-10-CM

## 2016-12-28 DIAGNOSIS — K651 Peritoneal abscess: Secondary | ICD-10-CM

## 2016-12-28 DIAGNOSIS — K5720 Diverticulitis of large intestine with perforation and abscess without bleeding: Principal | ICD-10-CM

## 2016-12-28 LAB — BASIC METABOLIC PANEL
ANION GAP: 9 (ref 5–15)
BUN: 15 mg/dL (ref 6–20)
CALCIUM: 8.6 mg/dL — AB (ref 8.9–10.3)
CO2: 25 mmol/L (ref 22–32)
CREATININE: 0.78 mg/dL (ref 0.61–1.24)
Chloride: 102 mmol/L (ref 101–111)
Glucose, Bld: 95 mg/dL (ref 65–99)
Potassium: 4.1 mmol/L (ref 3.5–5.1)
SODIUM: 136 mmol/L (ref 135–145)

## 2016-12-28 LAB — APTT: aPTT: 29 seconds (ref 24–36)

## 2016-12-28 LAB — CBC
HCT: 41 % (ref 39.0–52.0)
Hemoglobin: 13.4 g/dL (ref 13.0–17.0)
MCH: 30.9 pg (ref 26.0–34.0)
MCHC: 32.7 g/dL (ref 30.0–36.0)
MCV: 94.7 fL (ref 78.0–100.0)
Platelets: 271 10*3/uL (ref 150–400)
RBC: 4.33 MIL/uL (ref 4.22–5.81)
RDW: 13.8 % (ref 11.5–15.5)
WBC: 14.8 10*3/uL — AB (ref 4.0–10.5)

## 2016-12-28 LAB — PROTIME-INR
INR: 1.36
PROTHROMBIN TIME: 16.7 s — AB (ref 11.4–15.2)

## 2016-12-28 MED ORDER — ENOXAPARIN SODIUM 40 MG/0.4ML ~~LOC~~ SOLN
40.0000 mg | SUBCUTANEOUS | Status: DC
  Administered 2016-12-29: 40 mg via SUBCUTANEOUS
  Filled 2016-12-28: qty 0.4

## 2016-12-28 MED ORDER — NIACIN 500 MG PO TABS
500.0000 mg | ORAL_TABLET | Freq: Three times a day (TID) | ORAL | Status: DC
  Administered 2016-12-28 – 2016-12-30 (×6): 500 mg via ORAL
  Filled 2016-12-28 (×13): qty 1

## 2016-12-28 NOTE — Progress Notes (Signed)
PROGRESS NOTE  Kyle [name]. IOE:[id number] DOB: [birth year] DOA: 12/27/2016 PCP: Redmond School, MD  Brief History:  82 y.o. male with medical history of perforated diverticulitis, essential hypertension, hyperlipidemia, ocular myasthenia gravis, and DVT no longer on anticoagulation presented with 3-4 day history of worsening abdominal pain which significantly worsened over the period of the past 24 hours. The patient was recently discharged from the hospital after a stay from 12/15/2016 through 12/19/2016 at which time, the patient was treated for perforated sigmoid diverticulitis. The patient was treated nonsurgically. He was discharged home with Cipro and Flagyl which he is due to finish on 12/28/2016. At home, the patient continued to have 4-5 loose stools per day associated with abdominal pain. He endorses compliance with his antibiotics. He denies any fevers, chills, chest pain, shortness breath, coughing, hemoptysis, nausea, vomiting, dysuria, hematuria, hematochezia, melena. Upon presentation, the patient was noted to have WBC 19.2, but he was afebrile and hemodynamically stable. The patient was started on Zosyn and IV fluids.  CT of the abdomen and pelvis showed advanced sigmoid diverticulitis with perforation and 3.8 cm abscess in the central pelvis. There was a 5 cm region of less distinct inflammatory changes with edema and gas.  General surgery was consulted to assist.  Assessment/Plan: Perforated diverticulitis/Intraabdominal abscess -Gen. surgery consulted -Continue IV Zosyn -Continue IV fluids -IR for percutaneous drainage of abscess  Essential hypertension -Continue losartan -Holding atenolol secondary to bradycardia -BP controlled  Hyperlipidemia -Continue niacin and Lovaza  Ocular myasthenia -Continue Mestinon and prednisone -double dose prednisone due to stress  Seizure disorder -Continue home dose of Keppra       Disposition Plan:   Home in  2-3 days  Family Communication:   Spouse updated at bedside 9/17  Consultants:  General surgery  Code Status:  FULL   DVT Prophylaxis:  [place] Lovenox   Procedures: As Listed in Progress Note Above  Antibiotics: None    Subjective: Patient complains of right lower quadrant and left lower quadrant abdominal pain. He denies any nausea, vomiting, fevers, chills, chest pain, short breath, nausea, vomiting, medications, melena.  Objective: Vitals:   12/27/16 1400 12/27/16 1501 12/27/16 2122 12/28/16 0526  BP: (!) 156/70 139/66 (!) 122/58 (!) 122/49  Pulse: (!) 50 (!) 45 76 71  Resp: 19 18 18 18   Temp:  97.6 F (36.4 C) 98.9 F (37.2 C) 98.5 F (36.9 C)  TempSrc:  Oral Oral Oral  SpO2: 98% 98% 98% 96%  Weight:  75.3 kg (166 lb)    Height:  5\' 8"  (1.727 m)      Intake/Output Summary (Last 24 hours) at 12/28/16 1111 Last data filed at 12/28/16 1829  Gross per 24 hour  Intake          1233.75 ml  Output              400 ml  Net           833.75 ml   Weight change:  Exam:   General:  Pt is alert, follows commands appropriately, not in acute distress  HEENT: No icterus, No thrush, No neck mass, [place]/AT  Cardiovascular: RRR, S1/S2, no rubs, no gallops  Respiratory: CTA bilaterally, no wheezing, no crackles, no rhonchi  Abdomen: Soft/+BS, RLQ, LLQ tender, non distended, no guarding  Extremities: No edema, No lymphangitis, No petechiae, No rashes, no synovitis   Data Reviewed: I have personally reviewed following labs and imaging studies Basic  Metabolic Panel:  Recent Labs Lab 12/27/16 1049 12/28/16 0628  NA 136 136  K 4.0 4.1  CL 102 102  CO2 24 25  GLUCOSE 206* 95  BUN 18 15  CREATININE 0.87 0.78  CALCIUM 8.9 8.6*   Liver Function Tests:  Recent Labs Lab 12/27/16 1049  AST 29  ALT 27  ALKPHOS 51  BILITOT 0.4  PROT 6.6  ALBUMIN 2.9*    Recent Labs Lab 12/27/16 1049  LIPASE 36   No results for input(s): AMMONIA in the last 168  hours. Coagulation Profile:  Recent Labs Lab 12/28/16 0628  INR 1.36   CBC:  Recent Labs Lab 12/27/16 1049 12/28/16 0628  WBC 19.2* 14.8*  NEUTROABS 16.7*  --   HGB 12.6* 13.4  HCT 38.0* 41.0  MCV 93.6 94.7  PLT 269 271   Cardiac Enzymes: No results for input(s): CKTOTAL, CKMB, CKMBINDEX, TROPONINI in the last 168 hours. BNP: Invalid input(s): POCBNP CBG: No results for input(s): GLUCAP in the last 168 hours. HbA1C: No results for input(s): HGBA1C in the last 72 hours. Urine analysis:    Component Value Date/Time   COLORURINE AMBER (A) 12/27/2016 1105   APPEARANCEUR CLEAR 12/27/2016 1105   LABSPEC 1.020 12/27/2016 1105   PHURINE 5.0 12/27/2016 1105   GLUCOSEU NEGATIVE 12/27/2016 1105   HGBUR NEGATIVE 12/27/2016 1105   BILIRUBINUR NEGATIVE 12/27/2016 1105   KETONESUR NEGATIVE 12/27/2016 1105   PROTEINUR NEGATIVE 12/27/2016 1105   NITRITE NEGATIVE 12/27/2016 1105   LEUKOCYTESUR MODERATE (A) 12/27/2016 1105   Sepsis Labs: @LABRCNTIP (procalcitonin:4,lacticidven:4) )No results found for this or any previous visit (from the past 240 hour(s)).   Scheduled Meds: . enoxaparin (LOVENOX) injection  40 mg Subcutaneous Q24H  . gabapentin  400 mg Oral QHS  . levETIRAcetam  250 mg Oral Daily  . levETIRAcetam  500 mg Oral QHS  . losartan  25 mg Oral QHS  . magnesium oxide  800 mg Oral Daily  . niacin  500 mg Oral TID WC  . omega-3 acid ethyl esters  1 g Oral TID  . predniSONE  10 mg Oral Q breakfast  . pyridostigmine  90 mg Oral TID  . simvastatin  20 mg Oral QHS   Continuous Infusions: . 0.9 % NaCl with KCl 20 mEq / L 75 mL/hr at 12/28/16 0613  . piperacillin-tazobactam (ZOSYN)  IV Stopped (12/28/16 1013)    Procedures/Studies: Ct Abdomen Pelvis Wo Contrast  Result Date: 12/27/2016 CLINICAL DATA:  Diverticulitis with perforation. Persistent fever and abdominal pain. EXAM: CT ABDOMEN AND PELVIS WITHOUT CONTRAST TECHNIQUE: Multidetector CT imaging of the abdomen  and pelvis was performed following the standard protocol without IV contrast. COMPARISON:  12/18/2016 FINDINGS: Lower chest: Normal Hepatobiliary: No liver lesion. Previous cholecystectomy. Benign hepatic calcifications unchanged. Pancreas: Normal Spleen: Normal Adrenals/Urinary Tract: Adrenal glands are normal. Kidneys show some cortical atrophy. Multiple small parapelvic cysts. Nonobstructing 2 mm stone in the right lower pole. Nonobstructing 4 mm stone in the left midportion. No evidence of passing stone. Bladder appears normal. Stomach/Bowel: No evidence of bowel obstruction. Advanced sigmoid diverticulitis with perforation. 3.8 cm abscess in the central pelvis. 5 cm region of less distinct inflammatory change with edema and gas. No evidence of cul de sac abscess. Vascular/Lymphatic: Aortic atherosclerosis. No aneurysm. IVC is normal. No retroperitoneal adenopathy. Reproductive: Multiple prostate seed implants. Other: No free fluid or air. Musculoskeletal: Chronic lumbar degenerative changes. IMPRESSION: Persistent sigmoid diverticulitis with perforation. 4cm abscess immediately adjacent. 5 cm region of phlegmonous inflammation with  air bubbles and edema. Electronically Signed   By: Nelson Chimes M.D.   On: 12/27/2016 11:20   Ct Abdomen Pelvis Wo Contrast  Result Date: 12/18/2016 CLINICAL DATA:  Inpatient. Recent diagnosis of perforated sigmoid diverticulitis, now presenting with abdominal pain and fever. EXAM: CT ABDOMEN AND PELVIS WITHOUT CONTRAST TECHNIQUE: Multidetector CT imaging of the abdomen and pelvis was performed following the standard protocol without IV contrast. COMPARISON:  12/15/2016 CT abdomen/ pelvis. FINDINGS: Lower chest: No significant pulmonary nodules or acute consolidative airspace disease. Coronary atherosclerosis. Hepatobiliary: Normal liver size. Stable granulomatous inferior right liver lobe calcification. No liver mass. Cholecystectomy. No biliary ductal dilatation. Pancreas:  Normal, with no mass or duct dilation. Spleen: Normal size. No mass. Adrenals/Urinary Tract: Normal adrenals. Nonobstructing 3 mm interpolar right renal stone. Nonobstructing 4 mm interpolar left renal stone. Small simple parapelvic renal cysts in both kidneys, unchanged. No hydronephrosis. No contour deforming renal mass. Normal bladder. Stomach/Bowel: Grossly normal stomach. Normal caliber small bowel with no small bowel wall thickening. The appendix is mildly dilated (9 mm diameter), unchanged. There is new mild wall thickening in the distal appendix, probably reactive. Moderate sigmoid diverticulosis. Mild segmental wall thickening throughout the mid sigmoid colon. There is extensive phlegmonous change in the sigmoid mesentery with extensive pericolonic free air, ill-defined fluid and fat stranding. The amount of free air is slightly decreased in the interval, particularly in the upper perirectal and para-aortic regions. The amount of fat stranding and ill-defined fluid in the sigmoid mesentery is increased. No well-defined measurable focal fluid collection. Oral contrast progresses to the ascending colon. No additional sites of large bowel wall thickening. Vascular/Lymphatic: Atherosclerotic nonaneurysmal abdominal aorta. No pathologically enlarged lymph nodes in the abdomen or pelvis. Reproductive: Normal size prostate. Brachytherapy seeds are again noted in the prostate. Other: No ascites. Musculoskeletal: No aggressive appearing focal osseous lesions. Marked lumbar spondylosis. IMPRESSION: 1. Perforated sigmoid diverticulitis, with mild interval worsening of extensive phlegmonous changes in the sigmoid mesentery. No well-defined measurable/drainable abscess at this time. 2. Additional findings include Aortic Atherosclerosis (ICD10-I70.0), coronary atherosclerosis and nonobstructing bilateral nephrolithiasis. Electronically Signed   By: Ilona Sorrel M.D.   On: 12/18/2016 10:55   Ct Abdomen Pelvis Wo  Contrast  Result Date: 12/15/2016 CLINICAL DATA:  Abdominal pain, diverticulitis suspected EXAM: CT ABDOMEN AND PELVIS WITHOUT CONTRAST TECHNIQUE: Multidetector CT imaging of the abdomen and pelvis was performed following the standard protocol without IV contrast. COMPARISON:  None. FINDINGS: Lower chest: Lung bases are clear. No effusions. Heart is normal size. Calcifications in the visualize coronary arteries. Hepatobiliary: Scattered calcifications in the liver compatible with old granulomatous disease. Prior cholecystectomy. Pancreas: No focal abnormality or ductal dilatation. Spleen: No focal abnormality.  Normal size. Adrenals/Urinary Tract: Bilateral renal parapelvic cysts. No hydronephrosis. Two calcifications noted in the bladder or possibly at the left ureterovesical junction. These are punctate 1-2 mm stones. Bilateral nonobstructing renal stones. Adrenal glands unremarkable. Stomach/Bowel: Extensive diverticular disease in the sigmoid colon. Extensive stranding and extraluminal gas noted throughout the pelvis adjacent to the sigmoid colon, extending superiorly in the retroperitoneum compatible with perforation. No fluid collection. No evidence of bowel obstruction. Vascular/Lymphatic: Aortic and iliac calcifications. No aneurysm or adenopathy. Reproductive: Prostate radiation seeds in place. Other: No free fluid. Musculoskeletal: Degenerative changes in the lumbar spine. No acute bony abnormality. IMPRESSION: Sigmoid diverticulosis with extensive changes of active diverticulitis and moderate to large amount of extraluminal gas in the soft tissues surrounding the sigmoid colon and dissecting superiorly in the retroperitoneum compatible with bowel  perforation. Bilateral nephrolithiasis. Punctate 1-2 mm stones within the bladder or possibly at the left ureterovesical junction. No hydronephrosis. Aortoiliac atherosclerosis. Coronary artery disease. Electronically Signed   By: Rolm Baptise M.D.   On:  12/15/2016 10:56    Girtie Wiersma, DO  [facility] Pager [phone]  If 7PM-7AM, please contact night-coverage [email or web address] Password [email or web address] 12/28/2016, 11:11 AM   LOS: 1 day

## 2016-12-28 NOTE — H&P (Signed)
History and Physical  Kyle [name]. KVQ:[id number] DOB: [birth year] DOA: 12/27/2016   PCP: Redmond School, MD   Patient coming from: Home  Chief Complaint: abdominal pain Date of service 12/27/16  HPI:  Kyle [name]. is a 82 y.o. male with medical history of perforated diverticulitis, essential hypertension, hyperlipidemia, ocular myasthenia gravis, and DVT no longer on anticoagulation presented with 3-4 day history of worsening abdominal pain which significantly worsened over the period of the past 24 hours. The patient was recently discharged from the hospital after a stay from 12/15/2016 through 12/19/2016 at which time, the patient was treated for perforated sigmoid diverticulitis. The patient was treated nonsurgically. He was discharged home with Cipro and Flagyl which he is due to finish on 12/28/2016. At home, the patient continued to have 4-5 loose stools per day associated with abdominal pain. He endorses compliance with his antibiotics. He denies any fevers, chills, chest pain, shortness breath, coughing, hemoptysis, nausea, vomiting, dysuria, hematuria, hematochezia, melena. Upon presentation, the patient was noted to have WBC 19.2, but he was afebrile and hemodynamically stable. The patient was started on Zosyn and IV fluids.  CT of the abdomen and pelvis showed advanced sigmoid diverticulitis with perforation and 3.8 cm abscess in the central pelvis. There was a 5 cm region of less distinct inflammatory changes with edema and gas.  Assessment/Plan: Perforated diverticulitis -Gen. surgery consulted -Continue IV Zosyn -Continue IV fluids  Essential hypertension -Continue losartan -Holding atenolol secondary to bradycardia  Hyperlipidemia -Continue niacin and Lovaza  Ocular myasthenia -Continue Mestinon and prednisone -double dose prednisone due to stress  Seizure disorder -Continue home dose of Keppra        Past Medical History:  Diagnosis Date  .  Arthritis   . Auto immune neutropenia (HCC)   . Bullous pemphigoid   . Cancer [facility])    Prostate  . Cervical spondylosis without myelopathy 01/22/2016  . DVT (deep venous thrombosis) ([place])    a. Has had a previous right lower extremity DVT in the setting of hospitalization and surgery (after his brain tumor was resected in 1993) but is no longer on Coumadin.  Marland Kitchen Dyslipidemia   . History of prostate cancer   . Hypertension   . Meningioma ([place])    a. s/p resection 1990s.  . Myasthenia ([place]) 02/14/2013  . Nocturnal leg cramps   . Ocular myasthenia gravis ([place])   . Seizures ([place])   . Sinus bradycardia   . Sleep paralysis   . Thyroid nodule, cold    Past Surgical History:  Procedure Laterality Date  . BRAIN SURGERY    . CHOLECYSTECTOMY    . COLONOSCOPY N/A 07/25/2013   Procedure: COLONOSCOPY;  Surgeon: Jamesetta So, MD;  Location[facility];  Service: Gastroenterology;  Laterality: N/A;  . COLONOSCOPY N/A 11/10/2016   Procedure: COLONOSCOPY;  Surgeon: Aviva Signs, MD;  Location: [facility];  Service: Gastroenterology;  Laterality: N/A;  . RADIOACTIVE SEED IMPLANT     Social History:  reports that he has never smoked. He has never used smokeless tobacco. He reports that he does not drink alcohol or use drugs.   Family History  Problem Relation Age of Onset  . Heart failure Mother   . Diabetes Mother   . Diabetes Sister   . Lung cancer Son   . Heart failure Daughter   . Colon cancer Neg Hx      Allergies  Allergen Reactions  . Bactericin [Bacitracin] Other (  See Comments)    Unknown reaction   . Iohexol      Code: HIVES, Desc: pt was premedicated/hives reaction occurred 24 hours post injection, Onset Date: [id number]   . Ivp Dye [Iodinated Diagnostic Agents]   . Sulfa Antibiotics Swelling and Rash     Prior to Admission medications   Medication Sig Start Date End Date Taking? Authorizing Provider  atenolol (TENORMIN) 25 MG tablet TAKE 1 TABLET BY MOUTH  DAILY  09/01/16  Yes Kathrynn Ducking, MD  ciprofloxacin (CIPRO) 500 MG tablet Take 1 tablet (500 mg total) by mouth 2 (two) times daily. 12/19/16  Yes Cherene Altes, MD  clobetasol (TEMOVATE) 0.05 % external solution Apply 1 application topically daily as needed (rash).  11/17/12  Yes [provider]  diclofenac (VOLTAREN) 75 MG EC tablet TAKE 1 TABLET BY MOUTH TWO  TIMES DAILY 10/19/16  Yes Kathrynn Ducking, MD  fish oil-omega-3 fatty acids 1000 MG capsule Take 1 g by mouth 3 (three) times daily.   Yes [provider]  gabapentin (NEURONTIN) 400 MG capsule Take 1 capsule (400 mg total) by mouth at bedtime. 05/18/16  Yes Kathrynn Ducking, MD  Garlic (GARLIQUE PO) Take 1 tablet by mouth daily.   Yes [provider]  levETIRAcetam (KEPPRA) 500 MG tablet TAKE 1/2 TABLET BY MOUTH IN THE MORNING AND 1 TABLET IN THE EVENING 01/23/16  Yes Kathrynn Ducking, MD  losartan (COZAAR) 25 MG tablet Take 25 mg by mouth at bedtime.  06/02/16  Yes [provider]  magnesium oxide (MAGOX 400) 400 (241.3 Mg) MG tablet Take 800 mg by mouth daily.   Yes [provider]  metroNIDAZOLE (FLAGYL) 500 MG tablet Take 1 tablet (500 mg total) by mouth 3 (three) times daily. 12/19/16 12/28/16 Yes Cherene Altes, MD  niacinamide 500 MG tablet Take 500 mg by mouth 3 (three) times daily.   Yes [provider]  Polyethyl Glycol-Propyl Glycol (SYSTANE) 0.4-0.3 % SOLN Place 1 drop into both eyes 3 (three) times daily as needed (for dry eyes).   Yes [provider]  predniSONE (DELTASONE) 5 MG tablet Take 2 tablets (10 mg total) by mouth daily with breakfast. 09/24/16  Yes Kathrynn Ducking, MD  pyridostigmine (MESTINON) 60 MG tablet Take 1.5 tablets (90 mg total) by mouth 3 (three) times daily. 09/14/16  Yes Kathrynn Ducking, MD  simvastatin (ZOCOR) 20 MG tablet Take 20 mg by mouth at bedtime.  06/02/16  Yes [provider]    Review of Systems:  Constitutional:  No  weight loss, night sweats, Fevers, chills, fatigue.  Head&Eyes: No headache.  No vision loss.  No eye pain or scotoma ENT:  No Difficulty swallowing,Tooth/dental problems,Sore throat,  No ear ache, post nasal drip,  Cardio-vascular:  No chest pain, Orthopnea, PND, swelling in lower extremities,  dizziness, palpitations  GI:  No   nausea, vomiting, diarrhea, loss of appetite, hematochezia, melena, heartburn, indigestion, Resp:  No shortness of breath with exertion or at rest. No cough. No coughing up of blood .No wheezing.No chest wall deformity  Skin:  no rash or lesions.  GU:  no dysuria, change in color of urine, no urgency or frequency. No flank pain.  Musculoskeletal:  No joint pain or swelling. No decreased range of motion. No back pain.  Psych:  No change in mood or affect. No depression or anxiety. Neurologic: No headache, no dysesthesia, no focal weakness, no vision loss. No syncope  Physical Exam:  Vitals:   12/27/16 1400 12/27/16 1501 12/27/16 2122 12/28/16 0526  BP: (!) 156/70 139/66 (!) 122/58 (!) 122/49  Pulse: (!) 50 (!) 45 76 71  Resp: 19 18 18 18   Temp:  97.6 F (36.4 C) 98.9 F (37.2 C) 98.5 F (36.9 C)  TempSrc:  Oral Oral Oral  SpO2: 98% 98% 98% 96%  Weight:  75.3 kg (166 lb)    Height:  5\' 8"  (1.727 m)     General:  A&O x 3, NAD, nontoxic, pleasant/cooperative Head/Eye: No conjunctival hemorrhage, no icterus, [place]/AT, No nystagmus ENT:  No icterus,  No thrush, good dentition, no pharyngeal exudate Neck:  No masses, no lymphadenpathy, no bruits CV:  RRR, no rub, no gallop, no S3 Lung:  CTAB, good air movement, no wheeze, no rhonchi Abdomen: soft, +BS, nondistended, no peritoneal signs; RLQ and periumbilical pain Ext: No cyanosis, No rashes, No petechiae, No lymphangitis, No edema Neuro: CNII-XII intact, strength 4/5 in bilateral upper and lower extremities, no dysmetria  Labs on Admission:  Basic Metabolic Panel:  Recent Labs Lab 12/27/16 1049  12/28/16 0628  NA 136 136  K 4.0 4.1  CL 102 102  CO2 24 25  GLUCOSE 206* 95  BUN 18 15  CREATININE 0.87 0.78  CALCIUM 8.9 8.6*   Liver Function Tests:  Recent Labs Lab 12/27/16 1049  AST 29  ALT 27  ALKPHOS 51  BILITOT 0.4  PROT 6.6  ALBUMIN 2.9*    Recent Labs Lab 12/27/16 1049  LIPASE 36   No results for input(s): AMMONIA in the last 168 hours. CBC:  Recent Labs Lab 12/27/16 1049 12/28/16 0628  WBC 19.2* 14.8*  NEUTROABS 16.7*  --   HGB 12.6* 13.4  HCT 38.0* 41.0  MCV 93.6 94.7  PLT 269 271   Coagulation Profile:  Recent Labs Lab 12/28/16 0628  INR 1.36   Cardiac Enzymes: No results for input(s): CKTOTAL, CKMB, CKMBINDEX, TROPONINI in the last 168 hours. BNP: Invalid input(s): POCBNP CBG: No results for input(s): GLUCAP in the last 168 hours. Urine analysis:    Component Value Date/Time   COLORURINE AMBER (A) 12/27/2016 1105   APPEARANCEUR CLEAR 12/27/2016 1105   LABSPEC 1.020 12/27/2016 1105   PHURINE 5.0 12/27/2016 1105   GLUCOSEU NEGATIVE 12/27/2016 1105   HGBUR NEGATIVE 12/27/2016 1105   BILIRUBINUR NEGATIVE 12/27/2016 1105   KETONESUR NEGATIVE 12/27/2016 1105   PROTEINUR NEGATIVE 12/27/2016 1105   NITRITE NEGATIVE 12/27/2016 1105   LEUKOCYTESUR MODERATE (A) 12/27/2016 1105   Sepsis Labs: @LABRCNTIP (procalcitonin:4,lacticidven:4) )No results found for this or any previous visit (from the past 240 hour(s)).   Radiological Exams on Admission: Ct Abdomen Pelvis Wo Contrast  Result Date: 12/27/2016 CLINICAL DATA:  Diverticulitis with perforation. Persistent fever and abdominal pain. EXAM: CT ABDOMEN AND PELVIS WITHOUT CONTRAST TECHNIQUE: Multidetector CT imaging of the abdomen and pelvis was performed following the standard protocol without IV contrast. COMPARISON:  12/18/2016 FINDINGS: Lower chest: Normal Hepatobiliary: No liver lesion. Previous cholecystectomy. Benign hepatic calcifications unchanged. Pancreas: Normal Spleen: Normal  Adrenals/Urinary Tract: Adrenal glands are normal. Kidneys show some cortical atrophy. Multiple small parapelvic cysts. Nonobstructing 2 mm stone in the right lower pole. Nonobstructing 4 mm stone in the left midportion. No evidence of passing stone. Bladder appears normal. Stomach/Bowel: No evidence of bowel obstruction. Advanced sigmoid diverticulitis with perforation. 3.8 cm abscess in the central pelvis. 5 cm region of less distinct inflammatory change with edema and gas. No evidence of cul de sac abscess. Vascular/Lymphatic: Aortic  atherosclerosis. No aneurysm. IVC is normal. No retroperitoneal adenopathy. Reproductive: Multiple prostate seed implants. Other: No free fluid or air. Musculoskeletal: Chronic lumbar degenerative changes. IMPRESSION: Persistent sigmoid diverticulitis with perforation. 4cm abscess immediately adjacent. 5 cm region of phlegmonous inflammation with air bubbles and edema. Electronically Signed   By: Nelson Chimes M.D.   On: 12/27/2016 11:20        Time spent:60 minutes Code Status:   FULL Family Communication:  No Family at bedside Disposition Plan: expect 2-3 day hospitalization Consults called: general surgery DVT Prophylaxis: [place] Lovenox  Adysson Revelle, DO  [facility] Pager [phone]  If 7PM-7AM, please contact night-coverage [email or web address] Password [facility] 12/28/2016, 10:43 AM

## 2016-12-28 NOTE — Progress Notes (Signed)
Patient ID: Kyle [name]., male   DOB: [birth year], 82 y.o.   MRN: [id number]   Scheduled for abscess drain placement at [facility] Radiology Tomorrow 9/18  See orders

## 2016-12-28 NOTE — Consult Note (Signed)
Reason for Consult: Sigmoid diverticulitis with abscess Referring Physician: Dr. Etta Grandchild. is an 82 y.o. male.  HPI: Patient is an 82 year old white male who was recently discharged from the hospital with perforated sigmoid diverticulitis with phlegmon formation who presented back to the hospital yesterday with a 24-hour history of worsening lower abdominal pain which was suprapubic in nature, made worse with straining when moving bowels. He denies any nausea or vomiting. Denies any fever or chills. He has not had any melena or hematochezia. CT scan of the abdomen done in the emergency room revealed a 4 cm contained abscess with a larger area of phlegmon formation. No free air was noted. The patient's white blood cell count was noted to be elevated at 19,000. He was then brought into the hospital for further management treatment as he was failing outpatient medical therapy. He currently has no significant pain.  Past Medical History:  Diagnosis Date  . Arthritis   . Auto immune neutropenia (HCC)   . Bullous pemphigoid   . Cancer [facility])    Prostate  . Cervical spondylosis without myelopathy 01/22/2016  . DVT (deep venous thrombosis) ([place])    a. Has had a previous right lower extremity DVT in the setting of hospitalization and surgery (after his brain tumor was resected in 1993) but is no longer on Coumadin.  Marland Kitchen Dyslipidemia   . History of prostate cancer   . Hypertension   . Meningioma ([place])    a. s/p resection 1990s.  . Myasthenia ([place]) 02/14/2013  . Nocturnal leg cramps   . Ocular myasthenia gravis ([place])   . Seizures ([place])   . Sinus bradycardia   . Sleep paralysis   . Thyroid nodule, cold     Past Surgical History:  Procedure Laterality Date  . BRAIN SURGERY    . CHOLECYSTECTOMY    . COLONOSCOPY N/A 07/25/2013   Procedure: COLONOSCOPY;  Surgeon: Jamesetta So, MD;  [facility]: [facility];  Service: Gastroenterology;  Laterality: N/A;  . COLONOSCOPY N/A 11/10/2016   Procedure: COLONOSCOPY;  Surgeon: Aviva Signs, MD;  Location: [facility];  Service: Gastroenterology;  Laterality: N/A;  . RADIOACTIVE SEED IMPLANT      Family History  Problem Relation Age of Onset  . Heart failure Mother   . Diabetes Mother   . Diabetes Sister   . Lung cancer Son   . Heart failure Daughter   . Colon cancer Neg Hx     Social History:  reports that he has never smoked. He has never used smokeless tobacco. He reports that he does not drink alcohol or use drugs.  Allergies:  Allergies  Allergen Reactions  . Bactericin [Bacitracin] Other (See Comments)    Unknown reaction   . Iohexol      Code: HIVES, Desc: pt was premedicated/hives reaction occurred 24 hours post injection, Onset Date: [id number]   . Ivp Dye [Iodinated Diagnostic Agents]   . Sulfa Antibiotics Swelling and Rash    Medications:  Scheduled: . enoxaparin (LOVENOX) injection  40 mg Subcutaneous Q24H  . gabapentin  400 mg Oral QHS  . levETIRAcetam  250 mg Oral Daily  . levETIRAcetam  500 mg Oral QHS  . losartan  25 mg Oral QHS  . magnesium oxide  800 mg Oral Daily  . niacin  500 mg Oral TID WC  . omega-3 acid ethyl esters  1 g Oral TID  . predniSONE  10 mg Oral Q breakfast  . pyridostigmine  90 mg Oral TID  . simvastatin  20 mg Oral QHS    Results for orders placed or performed during the hospital encounter of 12/27/16 (from the past 48 hour(s))  Comprehensive metabolic panel     Status: Abnormal   Collection Time: 12/27/16 10:49 AM  Result Value Ref Range   Sodium 136 135 - 145 mmol/L   Potassium 4.0 3.5 - 5.1 mmol/L   Chloride 102 101 - 111 mmol/L   CO2 24 22 - 32 mmol/L   Glucose, Bld 206 (H) 65 - 99 mg/dL   BUN 18 6 - 20 mg/dL   Creatinine, Ser 0.87 0.61 - 1.24 mg/dL   Calcium 8.9 8.9 - 10.3 mg/dL   Total Protein 6.6 6.5 - 8.1 g/dL   Albumin 2.9 (L) 3.5 - 5.0 g/dL   AST 29 15 - 41 U/L   ALT 27 17 - 63 U/L   Alkaline Phosphatase 51 38 - 126 U/L   Total Bilirubin 0.4 0.3 - 1.2  mg/dL   GFR calc non Af Amer >60 >60 mL/min   GFR calc Af Amer >60 >60 mL/min    Comment: (NOTE) The eGFR has been calculated using the CKD EPI equation. This calculation has not been validated in all clinical situations. eGFR's persistently <60 mL/min signify possible Chronic Kidney Disease.    Anion gap 10 5 - 15  Lipase, blood     Status: None   Collection Time: 12/27/16 10:49 AM  Result Value Ref Range   Lipase 36 11 - 51 U/L  CBC with Differential     Status: Abnormal   Collection Time: 12/27/16 10:49 AM  Result Value Ref Range   WBC 19.2 (H) 4.0 - 10.5 K/uL   RBC 4.06 (L) 4.22 - 5.81 MIL/uL   Hemoglobin 12.6 (L) 13.0 - 17.0 g/dL   HCT 38.0 (L) 39.0 - 52.0 %   MCV 93.6 78.0 - 100.0 fL   MCH 31.0 26.0 - 34.0 pg   MCHC 33.2 30.0 - 36.0 g/dL   RDW 13.6 11.5 - 15.5 %   Platelets 269 150 - 400 K/uL   Neutrophils Relative % 86 %   Neutro Abs 16.7 (H) 1.7 - 7.7 K/uL   Lymphocytes Relative 5 %   Lymphs Abs 0.9 0.7 - 4.0 K/uL   Monocytes Relative 8 %   Monocytes Absolute 1.4 (H) 0.1 - 1.0 K/uL   Eosinophils Relative 1 %   Eosinophils Absolute 0.1 0.0 - 0.7 K/uL   Basophils Relative 0 %   Basophils Absolute 0.0 0.0 - 0.1 K/uL  Urinalysis, Routine w reflex microscopic     Status: Abnormal   Collection Time: 12/27/16 11:05 AM  Result Value Ref Range   Color, Urine AMBER (A) YELLOW    Comment: BIOCHEMICALS MAY BE AFFECTED BY COLOR   APPearance CLEAR CLEAR   Specific Gravity, Urine 1.020 1.005 - 1.030   pH 5.0 5.0 - 8.0   Glucose, UA NEGATIVE NEGATIVE mg/dL   Hgb urine dipstick NEGATIVE NEGATIVE   Bilirubin Urine NEGATIVE NEGATIVE   Ketones, ur NEGATIVE NEGATIVE mg/dL   Protein, ur NEGATIVE NEGATIVE mg/dL   Nitrite NEGATIVE NEGATIVE   Leukocytes, UA MODERATE (A) NEGATIVE   RBC / HPF 6-30 0 - 5 RBC/hpf   WBC, UA 0-5 0 - 5 WBC/hpf   Bacteria, UA NONE SEEN NONE SEEN   Squamous Epithelial / LPF NONE SEEN NONE SEEN   Mucus PRESENT    Hyaline Casts, UA PRESENT   Basic  metabolic panel     Status: Abnormal   Collection Time: 12/28/16  6:28 AM  Result Value Ref Range   Sodium 136 135 - 145 mmol/L   Potassium 4.1 3.5 - 5.1 mmol/L   Chloride 102 101 - 111 mmol/L   CO2 25 22 - 32 mmol/L   Glucose, Bld 95 65 - 99 mg/dL   BUN 15 6 - 20 mg/dL   Creatinine, Ser 0.78 0.61 - 1.24 mg/dL   Calcium 8.6 (L) 8.9 - 10.3 mg/dL   GFR calc non Af Amer >60 >60 mL/min   GFR calc Af Amer >60 >60 mL/min    Comment: (NOTE) The eGFR has been calculated using the CKD EPI equation. This calculation has not been validated in all clinical situations. eGFR's persistently <60 mL/min signify possible Chronic Kidney Disease.    Anion gap 9 5 - 15  CBC     Status: Abnormal   Collection Time: 12/28/16  6:28 AM  Result Value Ref Range   WBC 14.8 (H) 4.0 - 10.5 K/uL   RBC 4.33 4.22 - 5.81 MIL/uL   Hemoglobin 13.4 13.0 - 17.0 g/dL   HCT 41.0 39.0 - 52.0 %   MCV 94.7 78.0 - 100.0 fL   MCH 30.9 26.0 - 34.0 pg   MCHC 32.7 30.0 - 36.0 g/dL   RDW 13.8 11.5 - 15.5 %   Platelets 271 150 - 400 K/uL  Protime-INR     Status: Abnormal   Collection Time: 12/28/16  6:28 AM  Result Value Ref Range   Prothrombin Time 16.7 (H) 11.4 - 15.2 seconds   INR 1.36   APTT     Status: None   Collection Time: 12/28/16  6:28 AM  Result Value Ref Range   aPTT 29 24 - 36 seconds    Ct Abdomen Pelvis Wo Contrast  Result Date: 12/27/2016 CLINICAL DATA:  Diverticulitis with perforation. Persistent fever and abdominal pain. EXAM: CT ABDOMEN AND PELVIS WITHOUT CONTRAST TECHNIQUE: Multidetector CT imaging of the abdomen and pelvis was performed following the standard protocol without IV contrast. COMPARISON:  12/18/2016 FINDINGS: Lower chest: Normal Hepatobiliary: No liver lesion. Previous cholecystectomy. Benign hepatic calcifications unchanged. Pancreas: Normal Spleen: Normal Adrenals/Urinary Tract: Adrenal glands are normal. Kidneys show some cortical atrophy. Multiple small parapelvic cysts.  Nonobstructing 2 mm stone in the right lower pole. Nonobstructing 4 mm stone in the left midportion. No evidence of passing stone. Bladder appears normal. Stomach/Bowel: No evidence of bowel obstruction. Advanced sigmoid diverticulitis with perforation. 3.8 cm abscess in the central pelvis. 5 cm region of less distinct inflammatory change with edema and gas. No evidence of cul de sac abscess. Vascular/Lymphatic: Aortic atherosclerosis. No aneurysm. IVC is normal. No retroperitoneal adenopathy. Reproductive: Multiple prostate seed implants. Other: No free fluid or air. Musculoskeletal: Chronic lumbar degenerative changes. IMPRESSION: Persistent sigmoid diverticulitis with perforation. 4cm abscess immediately adjacent. 5 cm region of phlegmonous inflammation with air bubbles and edema. Electronically Signed   By: Nelson Chimes M.D.   On: 12/27/2016 11:20    ROS:  Pertinent items are noted in HPI.  Blood pressure (!) 122/49, pulse 71, temperature 98.5 F (36.9 C), temperature source Oral, resp. rate 18, height 5' 8"  (1.727 m), weight 166 lb (75.3 kg), SpO2 96 %. Physical Exam: Pleasant well-developed well-nourished white male no acute distress Head is normocephalic, atraumatic. Lungs clear auscultation with equal breath sounds bilaterally. Heart examination reveals a regular rate and rhythm without S3, S4, murmurs. Abdomen is soft with no significant  distention noted. Minimal suprapubic and left lower quadrant tenderness is noted to deep palpation. No hepatosplenomegaly, masses, or hernias are noted. No rigidity is noted.  CT scan images personally reviewed  Assessment/Plan: Impression: Sigmoid diverticulitis with contained perforation and abscess. Has somewhat filled outpatient antibiotic therapy. Plan: Arrangements have been made for interventional radiology to drain the abscess percutaneously. Further management pending those results. Continue IV Zosyn. Leukocytosis is starting to resolve.  Aviva Signs 12/28/2016, 12:12 PM

## 2016-12-29 ENCOUNTER — Ambulatory Visit (HOSPITAL_COMMUNITY)
Admit: 2016-12-29 | Discharge: 2016-12-29 | Disposition: A | Discharge status: Home / Self Care | Payer: Medicare Other | Attending: General Surgery | Admitting: General Surgery

## 2016-12-29 DIAGNOSIS — K5780 Diverticulitis of intestine, part unspecified, with perforation and abscess without bleeding: Secondary | ICD-10-CM | POA: Diagnosis not present

## 2016-12-29 LAB — CBC
HCT: 40.9 % (ref 39.0–52.0)
HEMOGLOBIN: 13.5 g/dL (ref 13.0–17.0)
MCH: 31 pg (ref 26.0–34.0)
MCHC: 33 g/dL (ref 30.0–36.0)
MCV: 93.8 fL (ref 78.0–100.0)
Platelets: 284 10*3/uL (ref 150–400)
RBC: 4.36 MIL/uL (ref 4.22–5.81)
RDW: 13.8 % (ref 11.5–15.5)
WBC: 13.4 10*3/uL — ABNORMAL HIGH (ref 4.0–10.5)

## 2016-12-29 LAB — BASIC METABOLIC PANEL
ANION GAP: 10 (ref 5–15)
BUN: 14 mg/dL (ref 6–20)
CALCIUM: 8.5 mg/dL — AB (ref 8.9–10.3)
CHLORIDE: 102 mmol/L (ref 101–111)
CO2: 23 mmol/L (ref 22–32)
Creatinine, Ser: 0.84 mg/dL (ref 0.61–1.24)
GFR calc non Af Amer: >60 mL/min (ref >60)
Glucose, Bld: 99 mg/dL (ref 65–99)
Potassium: 3.9 mmol/L (ref 3.5–5.1)
Sodium: 135 mmol/L (ref 135–145)

## 2016-12-29 LAB — URINE CULTURE
CULTURE: NO GROWTH
Special Requests: NORMAL

## 2016-12-29 MED ORDER — FENTANYL CITRATE (PF) 100 MCG/2ML IJ SOLN
INTRAMUSCULAR | Status: AC | PRN
  Administered 2016-12-29 (×4): 25 ug via INTRAVENOUS

## 2016-12-29 MED ORDER — MIDAZOLAM HCL 2 MG/2ML IJ SOLN
INTRAMUSCULAR | Status: AC
  Filled 2016-12-29: qty 4

## 2016-12-29 MED ORDER — MIDAZOLAM HCL 2 MG/2ML IJ SOLN
INTRAMUSCULAR | Status: AC | PRN
  Administered 2016-12-29: 1 mg via INTRAVENOUS
  Administered 2016-12-29 (×2): 0.5 mg via INTRAVENOUS
  Administered 2016-12-29: 1 mg via INTRAVENOUS

## 2016-12-29 MED ORDER — FENTANYL CITRATE (PF) 100 MCG/2ML IJ SOLN
INTRAMUSCULAR | Status: AC
  Filled 2016-12-29: qty 2

## 2016-12-29 MED ORDER — SODIUM CHLORIDE 0.9% FLUSH
5.0000 mL | Freq: Three times a day (TID) | INTRAVENOUS | Status: DC
  Administered 2016-12-29 – 2016-12-30 (×2): 5 mL via INTRAVENOUS

## 2016-12-29 MED ORDER — LIDOCAINE HCL 1 % IJ SOLN
INTRAMUSCULAR | Status: AC
  Filled 2016-12-29: qty 20

## 2016-12-29 MED ORDER — HYDROCODONE-ACETAMINOPHEN 5-325 MG PO TABS: 1.0000 | ORAL_TABLET | ORAL | Status: DC | PRN

## 2016-12-29 NOTE — Sedation Documentation (Signed)
Patient denies pain and is resting comfortably.  

## 2016-12-29 NOTE — Progress Notes (Addendum)
PROGRESS NOTE  Kyle [name]. KGY:[id number] DOB: 1934-08-27 DOA: 12/27/2016 PCP: Redmond School, MD Brief History:  82 y.o.malewith medical history of perforated diverticulitis, essential hypertension, hyperlipidemia, ocular myasthenia gravis, and DVT no longer on anticoagulation presented with 3-4day history of worsening abdominal pain which significantly worsened over the period of the past 24 hours. The patient was recently discharged from the hospital after a stay from 12/15/2016 through 12/19/2016 at which time, the patient was treated for perforated sigmoid diverticulitis. The patient was treated nonsurgically. He was discharged home with Cipro and Flagyl which he is due to finish on 12/28/2016. At home, the patient continued to have 4-5 loose stools per day associated with abdominal pain. He endorses compliance with his antibiotics. He denies any fevers, chills, chest pain, shortness breath, coughing, hemoptysis, nausea, vomiting, dysuria, hematuria, hematochezia, melena. Upon presentation, the patient was noted to have WBC 19.2, but he was afebrile and hemodynamically stable. The patient was started on Zosyn and IV fluids. CT of the abdomen and pelvis showed advanced sigmoid diverticulitis with perforation and 3.8 cm abscess in the central pelvis. There was a 5 cm region of less distinct inflammatory changes with edema and gas.  General surgery was consulted to assist.  Assessment/Plan: Perforated diverticulitis/Intraabdominal abscess -Gen. surgery consult appreciated -Continue IV Zosyn -Continue IV fluids -advance diet -12/29/16--IR percutaneous drainage of abscess-->JP drain placed-->follow cultures  Essential hypertension -Continue losartan -Holding atenolol secondary to bradycardia -BP controlled  Hyperlipidemia -Continue statin, niacin and Lovaza  Ocular myasthenia -Continue Mestinon and prednisone  Seizure disorder -Continue home dose of  Keppra     Disposition Plan:   Home in 1-2 days when cleared by surgery Family Communication:  No family present  Consultants:  General surgery  Code Status:  FULL   DVT Prophylaxis:  [place] Lovenox   Procedures: As Listed in Progress Note Above  Antibiotics: Zosyn 12/27/16>>>    Subjective: Overall, the patient states that his abdominal pain is slightly improved. She feels hungry and wants to eat. He denies any fevers, chest congestion, shortness breath, nausea, vomiting. He denies any hematochezia or melena.  Objective: Vitals:   12/27/16 2122 12/28/16 0526 12/28/16 2100 12/29/16 0614  BP: (!) 122/58 (!) 122/49 (!) 121/59 (!) 117/56  Pulse: 76 71 61 71  Resp: 18 18 18 18   Temp: 98.9 F (37.2 C) 98.5 F (36.9 C) 98.5 F (36.9 C) 98.6 F (37 C)  TempSrc: Oral Oral Oral Oral  SpO2: 98% 96% 97% 98%  Weight:      Height:        Intake/Output Summary (Last 24 hours) at 12/29/16 1736 Last data filed at 12/29/16 0736  Gross per 24 hour  Intake             2325 ml  Output              700 ml  Net             1625 ml   Weight change:  Exam:   General:  Pt is alert, follows commands appropriately, not in acute distress  HEENT: No icterus, No thrush, No neck mass, [place]/AT  Cardiovascular: RRR, S1/S2, no rubs, no gallops  Respiratory: CTA bilaterally, no wheezing, no crackles, no rhonchi  Abdomen: Soft/+BS, suprapubic and LLQ tender, non distended, no guarding  Extremities: No edema, No lymphangitis, No petechiae, No rashes, no synovitis   Data Reviewed: I have personally reviewed following labs and  imaging studies Basic Metabolic Panel:  Recent Labs Lab 12/27/16 1049 12/28/16 0628 12/29/16 0633  NA 136 136 135  K 4.0 4.1 3.9  CL 102 102 102  CO2 24 25 23   GLUCOSE 206* 95 99  BUN 18 15 14   CREATININE 0.87 0.78 0.84  CALCIUM 8.9 8.6* 8.5*   Liver Function Tests:  Recent Labs Lab 12/27/16 1049  AST 29  ALT 27  ALKPHOS 51  BILITOT 0.4   PROT 6.6  ALBUMIN 2.9*    Recent Labs Lab 12/27/16 1049  LIPASE 36   No results for input(s): AMMONIA in the last 168 hours. Coagulation Profile:  Recent Labs Lab 12/28/16 0628  INR 1.36   CBC:  Recent Labs Lab 12/27/16 1049 12/28/16 0628 12/29/16 0633  WBC 19.2* 14.8* 13.4*  NEUTROABS 16.7*  --   --   HGB 12.6* 13.4 13.5  HCT 38.0* 41.0 40.9  MCV 93.6 94.7 93.8  PLT 269 271 284   Cardiac Enzymes: No results for input(s): CKTOTAL, CKMB, CKMBINDEX, TROPONINI in the last 168 hours. BNP: Invalid input(s): POCBNP CBG: No results for input(s): GLUCAP in the last 168 hours. HbA1C: No results for input(s): HGBA1C in the last 72 hours. Urine analysis:    Component Value Date/Time   COLORURINE AMBER (A) 12/27/2016 1105   APPEARANCEUR CLEAR 12/27/2016 1105   LABSPEC 1.020 12/27/2016 1105   PHURINE 5.0 12/27/2016 1105   GLUCOSEU NEGATIVE 12/27/2016 1105   HGBUR NEGATIVE 12/27/2016 1105   BILIRUBINUR NEGATIVE 12/27/2016 1105   KETONESUR NEGATIVE 12/27/2016 1105   PROTEINUR NEGATIVE 12/27/2016 1105   NITRITE NEGATIVE 12/27/2016 1105   LEUKOCYTESUR MODERATE (A) 12/27/2016 1105   Sepsis Labs: @LABRCNTIP (procalcitonin:4,lacticidven:4) ) Recent Results (from the past 240 hour(s))  Urine culture     Status: None   Collection Time: 12/27/16 11:05 AM  Result Value Ref Range Status   Specimen Description URINE, CLEAN CATCH  Final   Special Requests Normal  Final   Culture   Final    NO GROWTH Performed at [facility], [place] [address] [place], [place] [address]    Report Status 12/29/2016 FINAL  Final     Scheduled Meds: . enoxaparin (LOVENOX) injection  40 mg Subcutaneous Q24H  . gabapentin  400 mg Oral QHS  . levETIRAcetam  250 mg Oral Daily  . levETIRAcetam  500 mg Oral QHS  . losartan  25 mg Oral QHS  . magnesium oxide  800 mg Oral Daily  . niacin  500 mg Oral TID WC  . omega-3 acid ethyl esters  1 g Oral TID  . predniSONE  10 mg Oral Q breakfast   . pyridostigmine  90 mg Oral TID  . simvastatin  20 mg Oral QHS   Continuous Infusions: . 0.9 % NaCl with KCl 20 mEq / L 75 mL/hr at 12/29/16 0909  . piperacillin-tazobactam (ZOSYN)  IV 3.375 g (12/29/16 1545)    Procedures/Studies: Ct Abdomen Pelvis Wo Contrast  Result Date: 12/27/2016 CLINICAL DATA:  Diverticulitis with perforation. Persistent fever and abdominal pain. EXAM: CT ABDOMEN AND PELVIS WITHOUT CONTRAST TECHNIQUE: Multidetector CT imaging of the abdomen and pelvis was performed following the standard protocol without IV contrast. COMPARISON:  12/18/2016 FINDINGS: Lower chest: Normal Hepatobiliary: No liver lesion. Previous cholecystectomy. Benign hepatic calcifications unchanged. Pancreas: Normal Spleen: Normal Adrenals/Urinary Tract: Adrenal glands are normal. Kidneys show some cortical atrophy. Multiple small parapelvic cysts. Nonobstructing 2 mm stone in the right lower pole. Nonobstructing 4 mm stone in the  left midportion. No evidence of passing stone. Bladder appears normal. Stomach/Bowel: No evidence of bowel obstruction. Advanced sigmoid diverticulitis with perforation. 3.8 cm abscess in the central pelvis. 5 cm region of less distinct inflammatory change with edema and gas. No evidence of cul de sac abscess. Vascular/Lymphatic: Aortic atherosclerosis. No aneurysm. IVC is normal. No retroperitoneal adenopathy. Reproductive: Multiple prostate seed implants. Other: No free fluid or air. Musculoskeletal: Chronic lumbar degenerative changes. IMPRESSION: Persistent sigmoid diverticulitis with perforation. 4cm abscess immediately adjacent. 5 cm region of phlegmonous inflammation with air bubbles and edema. Electronically Signed   By: Nelson Chimes M.D.   On: 12/27/2016 11:20   Ct Abdomen Pelvis Wo Contrast  Result Date: 12/18/2016 CLINICAL DATA:  Inpatient. Recent diagnosis of perforated sigmoid diverticulitis, now presenting with abdominal pain and fever. EXAM: CT ABDOMEN AND PELVIS  WITHOUT CONTRAST TECHNIQUE: Multidetector CT imaging of the abdomen and pelvis was performed following the standard protocol without IV contrast. COMPARISON:  12/15/2016 CT abdomen/ pelvis. FINDINGS: Lower chest: No significant pulmonary nodules or acute consolidative airspace disease. Coronary atherosclerosis. Hepatobiliary: Normal liver size. Stable granulomatous inferior right liver lobe calcification. No liver mass. Cholecystectomy. No biliary ductal dilatation. Pancreas: Normal, with no mass or duct dilation. Spleen: Normal size. No mass. Adrenals/Urinary Tract: Normal adrenals. Nonobstructing 3 mm interpolar right renal stone. Nonobstructing 4 mm interpolar left renal stone. Small simple parapelvic renal cysts in both kidneys, unchanged. No hydronephrosis. No contour deforming renal mass. Normal bladder. Stomach/Bowel: Grossly normal stomach. Normal caliber small bowel with no small bowel wall thickening. The appendix is mildly dilated (9 mm diameter), unchanged. There is new mild wall thickening in the distal appendix, probably reactive. Moderate sigmoid diverticulosis. Mild segmental wall thickening throughout the mid sigmoid colon. There is extensive phlegmonous change in the sigmoid mesentery with extensive pericolonic free air, ill-defined fluid and fat stranding. The amount of free air is slightly decreased in the interval, particularly in the upper perirectal and para-aortic regions. The amount of fat stranding and ill-defined fluid in the sigmoid mesentery is increased. No well-defined measurable focal fluid collection. Oral contrast progresses to the ascending colon. No additional sites of large bowel wall thickening. Vascular/Lymphatic: Atherosclerotic nonaneurysmal abdominal aorta. No pathologically enlarged lymph nodes in the abdomen or pelvis. Reproductive: Normal size prostate. Brachytherapy seeds are again noted in the prostate. Other: No ascites. Musculoskeletal: No aggressive appearing focal  osseous lesions. Marked lumbar spondylosis. IMPRESSION: 1. Perforated sigmoid diverticulitis, with mild interval worsening of extensive phlegmonous changes in the sigmoid mesentery. No well-defined measurable/drainable abscess at this time. 2. Additional findings include Aortic Atherosclerosis (ICD10-I70.0), coronary atherosclerosis and nonobstructing bilateral nephrolithiasis. Electronically Signed   By: Ilona Sorrel M.D.   On: 12/18/2016 10:55   Ct Abdomen Pelvis Wo Contrast  Result Date: 12/15/2016 CLINICAL DATA:  Abdominal pain, diverticulitis suspected EXAM: CT ABDOMEN AND PELVIS WITHOUT CONTRAST TECHNIQUE: Multidetector CT imaging of the abdomen and pelvis was performed following the standard protocol without IV contrast. COMPARISON:  None. FINDINGS: Lower chest: Lung bases are clear. No effusions. Heart is normal size. Calcifications in the visualize coronary arteries. Hepatobiliary: Scattered calcifications in the liver compatible with old granulomatous disease. Prior cholecystectomy. Pancreas: No focal abnormality or ductal dilatation. Spleen: No focal abnormality.  Normal size. Adrenals/Urinary Tract: Bilateral renal parapelvic cysts. No hydronephrosis. Two calcifications noted in the bladder or possibly at the left ureterovesical junction. These are punctate 1-2 mm stones. Bilateral nonobstructing renal stones. Adrenal glands unremarkable. Stomach/Bowel: Extensive diverticular disease in the sigmoid colon. Extensive stranding and  extraluminal gas noted throughout the pelvis adjacent to the sigmoid colon, extending superiorly in the retroperitoneum compatible with perforation. No fluid collection. No evidence of bowel obstruction. Vascular/Lymphatic: Aortic and iliac calcifications. No aneurysm or adenopathy. Reproductive: Prostate radiation seeds in place. Other: No free fluid. Musculoskeletal: Degenerative changes in the lumbar spine. No acute bony abnormality. IMPRESSION: Sigmoid diverticulosis with  extensive changes of active diverticulitis and moderate to large amount of extraluminal gas in the soft tissues surrounding the sigmoid colon and dissecting superiorly in the retroperitoneum compatible with bowel perforation. Bilateral nephrolithiasis. Punctate 1-2 mm stones within the bladder or possibly at the left ureterovesical junction. No hydronephrosis. Aortoiliac atherosclerosis. Coronary artery disease. Electronically Signed   By: Rolm Baptise M.D.   On: 12/15/2016 10:56    Zella Dewan, DO  [facility] Pager [phone]  If 7PM-7AM, please contact night-coverage [email or web address] Password [email or web address] 12/29/2016, 5:36 PM   LOS: 2 days

## 2016-12-29 NOTE — Consult Note (Signed)
Chief Complaint: Patient was seen in consultation today for abdominal abscess  Referring Physician(s): Jenkins,Mark  History of Present Illness: Kyle [name]. is a 82 y.o. male with past medical history of arthritis, prostate cancer, DVT, HTN, and sinus bradycardia who presented to [facility] with severe abdominal pain.  Patient found to have sigmoid diverticulitis.   CT Abdomen 12/27/16: Persistent sigmoid diverticulitis with perforation. 4cm abscess immediately adjacent. 5 cm region of phlegmonous inflammation with air bubbles and edema.  IR consulted for possible aspiration and and drainage at the request of Dr. Arnoldo Morale.  Case reviewed by Dr. Vernard Gambles and approved.  Patient presents for procedure today at [facility].   Past Medical History:  Diagnosis Date  . Arthritis   . Auto immune neutropenia (HCC)   . Bullous pemphigoid   . Cancer [facility])    Prostate  . Cervical spondylosis without myelopathy 01/22/2016  . DVT (deep venous thrombosis) ([place])    a. Has had a previous right lower extremity DVT in the setting of hospitalization and surgery (after his brain tumor was resected in 1993) but is no longer on Coumadin.  Marland Kitchen Dyslipidemia   . History of prostate cancer   . Hypertension   . Meningioma ([place])    a. s/p resection 1990s.  . Myasthenia ([place]) 02/14/2013  . Nocturnal leg cramps   . Ocular myasthenia gravis ([place])   . Seizures ([place])   . Sinus bradycardia   . Sleep paralysis   . Thyroid nodule, cold     Past Surgical History:  Procedure Laterality Date  . BRAIN SURGERY    . CHOLECYSTECTOMY    . COLONOSCOPY N/A 07/25/2013   Procedure: COLONOSCOPY;  Surgeon: Jamesetta So, MD;  Location: [facility];  Service: Gastroenterology;  Laterality: N/A;  . COLONOSCOPY N/A 11/10/2016   Procedure: COLONOSCOPY;  Surgeon: Aviva Signs, MD;  Location: [facility];  Service: Gastroenterology;  Laterality: N/A;  . RADIOACTIVE SEED IMPLANT      Allergies: Bactericin [bacitracin];  Iohexol; Ivp dye [iodinated diagnostic agents]; and Sulfa antibiotics  Medications: Prior to Admission medications   Medication Sig Start Date End Date Taking? Authorizing Provider  atenolol (TENORMIN) 25 MG tablet TAKE 1 TABLET BY MOUTH  DAILY 09/01/16   Kathrynn Ducking, MD  ciprofloxacin (CIPRO) 500 MG tablet Take 1 tablet (500 mg total) by mouth 2 (two) times daily. 12/19/16   Cherene Altes, MD  clobetasol (TEMOVATE) 0.05 % external solution Apply 1 application topically daily as needed (rash).  11/17/12   [provider]  diclofenac (VOLTAREN) 75 MG EC tablet TAKE 1 TABLET BY MOUTH TWO  TIMES DAILY 10/19/16   Kathrynn Ducking, MD  fish oil-omega-3 fatty acids 1000 MG capsule Take 1 g by mouth 3 (three) times daily.    [provider]  gabapentin (NEURONTIN) 400 MG capsule Take 1 capsule (400 mg total) by mouth at bedtime. 05/18/16   Kathrynn Ducking, MD  Garlic (GARLIQUE PO) Take 1 tablet by mouth daily.    [provider]  levETIRAcetam (KEPPRA) 500 MG tablet TAKE 1/2 TABLET BY MOUTH IN THE MORNING AND 1 TABLET IN THE EVENING 01/23/16   Kathrynn Ducking, MD  losartan (COZAAR) 25 MG tablet Take 25 mg by mouth at bedtime.  06/02/16   [provider]  magnesium oxide (MAGOX 400) 400 (241.3 Mg) MG tablet Take 800 mg by mouth daily.    [provider]  niacinamide 500 MG tablet Take 500 mg  by mouth 3 (three) times daily.    [provider]  Polyethyl Glycol-Propyl Glycol (SYSTANE) 0.4-0.3 % SOLN Place 1 drop into both eyes 3 (three) times daily as needed (for dry eyes).    [provider]  predniSONE (DELTASONE) 5 MG tablet Take 2 tablets (10 mg total) by mouth daily with breakfast. 09/24/16   Kathrynn Ducking, MD  pyridostigmine (MESTINON) 60 MG tablet Take 1.5 tablets (90 mg total) by mouth 3 (three) times daily. 09/14/16   Kathrynn Ducking, MD  simvastatin (ZOCOR) 20 MG tablet Take 20 mg by mouth at bedtime.  06/02/16   [provider]     Family History  Problem Relation Age of Onset  . Heart failure Mother   . Diabetes Mother   . Diabetes Sister   . Lung cancer Son   . Heart failure Daughter   . Colon cancer Neg Hx     Social History   Social History  . Marital status: Married    Spouse name: N/A  . Number of children: 1  . Years of education: 31 [place]   Occupational History  . retired    Social History Main Topics  . Smoking status: Never Smoker  . Smokeless tobacco: Never Used  . Alcohol use No  . Drug use: No  . Sexual activity: Not on file   Other Topics Concern  . Not on file   Social History Narrative   Patient is right handed.   Patient drinks 1 cup of coffee daily.   Review of Systems  Constitutional: Negative for fatigue and fever.  Respiratory: Negative for cough and shortness of breath.   Cardiovascular: Negative for chest pain.  Gastrointestinal: Positive for abdominal pain.  Psychiatric/Behavioral: Negative for behavioral problems and confusion.    Vital Signs: There were no vitals taken for this visit.  Physical Exam  Constitutional: He is oriented to person, place, and time. He appears well-developed.  Cardiovascular: Normal rate, regular rhythm and normal heart sounds.   Pulmonary/Chest: Effort normal and breath sounds normal. No respiratory distress.  Abdominal: Soft.  Neurological: He is alert and oriented to person, place, and time.  Skin: Skin is warm and dry.  Psychiatric: He has a normal mood and affect. His behavior is normal. Judgment and thought content normal.  Nursing note and vitals reviewed.   Mallampati Score:  MD Evaluation Airway: WNL Heart: WNL Abdomen: WNL Chest/ Lungs: WNL ASA  Classification: 3 Mallampati/Airway Score: Two  Imaging: Ct Abdomen Pelvis Wo Contrast  Result Date: 12/27/2016 CLINICAL DATA:  Diverticulitis with perforation. Persistent fever and abdominal pain. EXAM: CT ABDOMEN AND PELVIS WITHOUT CONTRAST TECHNIQUE:  Multidetector CT imaging of the abdomen and pelvis was performed following the standard protocol without IV contrast. COMPARISON:  12/18/2016 FINDINGS: Lower chest: Normal Hepatobiliary: No liver lesion. Previous cholecystectomy. Benign hepatic calcifications unchanged. Pancreas: Normal Spleen: Normal Adrenals/Urinary Tract: Adrenal glands are normal. Kidneys show some cortical atrophy. Multiple small parapelvic cysts. Nonobstructing 2 mm stone in the right lower pole. Nonobstructing 4 mm stone in the left midportion. No evidence of passing stone. Bladder appears normal. Stomach/Bowel: No evidence of bowel obstruction. Advanced sigmoid diverticulitis with perforation. 3.8 cm abscess in the central pelvis. 5 cm region of less distinct inflammatory change with edema and gas. No evidence of cul de sac abscess. Vascular/Lymphatic: Aortic atherosclerosis. No aneurysm. IVC is normal. No retroperitoneal adenopathy. Reproductive: Multiple prostate seed implants. Other: No free fluid or air. Musculoskeletal: Chronic lumbar degenerative changes. IMPRESSION:  Persistent sigmoid diverticulitis with perforation. 4cm abscess immediately adjacent. 5 cm region of phlegmonous inflammation with air bubbles and edema. Electronically Signed   By: Nelson Chimes M.D.   On: 12/27/2016 11:20   Ct Abdomen Pelvis Wo Contrast  Result Date: 12/18/2016 CLINICAL DATA:  Inpatient. Recent diagnosis of perforated sigmoid diverticulitis, now presenting with abdominal pain and fever. EXAM: CT ABDOMEN AND PELVIS WITHOUT CONTRAST TECHNIQUE: Multidetector CT imaging of the abdomen and pelvis was performed following the standard protocol without IV contrast. COMPARISON:  12/15/2016 CT abdomen/ pelvis. FINDINGS: Lower chest: No significant pulmonary nodules or acute consolidative airspace disease. Coronary atherosclerosis. Hepatobiliary: Normal liver size. Stable granulomatous inferior right liver lobe calcification. No liver mass. Cholecystectomy. No  biliary ductal dilatation. Pancreas: Normal, with no mass or duct dilation. Spleen: Normal size. No mass. Adrenals/Urinary Tract: Normal adrenals. Nonobstructing 3 mm interpolar right renal stone. Nonobstructing 4 mm interpolar left renal stone. Small simple parapelvic renal cysts in both kidneys, unchanged. No hydronephrosis. No contour deforming renal mass. Normal bladder. Stomach/Bowel: Grossly normal stomach. Normal caliber small bowel with no small bowel wall thickening. The appendix is mildly dilated (9 mm diameter), unchanged. There is new mild wall thickening in the distal appendix, probably reactive. Moderate sigmoid diverticulosis. Mild segmental wall thickening throughout the mid sigmoid colon. There is extensive phlegmonous change in the sigmoid mesentery with extensive pericolonic free air, ill-defined fluid and fat stranding. The amount of free air is slightly decreased in the interval, particularly in the upper perirectal and para-aortic regions. The amount of fat stranding and ill-defined fluid in the sigmoid mesentery is increased. No well-defined measurable focal fluid collection. Oral contrast progresses to the ascending colon. No additional sites of large bowel wall thickening. Vascular/Lymphatic: Atherosclerotic nonaneurysmal abdominal aorta. No pathologically enlarged lymph nodes in the abdomen or pelvis. Reproductive: Normal size prostate. Brachytherapy seeds are again noted in the prostate. Other: No ascites. Musculoskeletal: No aggressive appearing focal osseous lesions. Marked lumbar spondylosis. IMPRESSION: 1. Perforated sigmoid diverticulitis, with mild interval worsening of extensive phlegmonous changes in the sigmoid mesentery. No well-defined measurable/drainable abscess at this time. 2. Additional findings include Aortic Atherosclerosis (ICD10-I70.0), coronary atherosclerosis and nonobstructing bilateral nephrolithiasis. Electronically Signed   By: Ilona Sorrel M.D.   On: 12/18/2016  10:55   Ct Abdomen Pelvis Wo Contrast  Result Date: 12/15/2016 CLINICAL DATA:  Abdominal pain, diverticulitis suspected EXAM: CT ABDOMEN AND PELVIS WITHOUT CONTRAST TECHNIQUE: Multidetector CT imaging of the abdomen and pelvis was performed following the standard protocol without IV contrast. COMPARISON:  None. FINDINGS: Lower chest: Lung bases are clear. No effusions. Heart is normal size. Calcifications in the visualize coronary arteries. Hepatobiliary: Scattered calcifications in the liver compatible with old granulomatous disease. Prior cholecystectomy. Pancreas: No focal abnormality or ductal dilatation. Spleen: No focal abnormality.  Normal size. Adrenals/Urinary Tract: Bilateral renal parapelvic cysts. No hydronephrosis. Two calcifications noted in the bladder or possibly at the left ureterovesical junction. These are punctate 1-2 mm stones. Bilateral nonobstructing renal stones. Adrenal glands unremarkable. Stomach/Bowel: Extensive diverticular disease in the sigmoid colon. Extensive stranding and extraluminal gas noted throughout the pelvis adjacent to the sigmoid colon, extending superiorly in the retroperitoneum compatible with perforation. No fluid collection. No evidence of bowel obstruction. Vascular/Lymphatic: Aortic and iliac calcifications. No aneurysm or adenopathy. Reproductive: Prostate radiation seeds in place. Other: No free fluid. Musculoskeletal: Degenerative changes in the lumbar spine. No acute bony abnormality. IMPRESSION: Sigmoid diverticulosis with extensive changes of active diverticulitis and moderate to large amount of extraluminal gas in  the soft tissues surrounding the sigmoid colon and dissecting superiorly in the retroperitoneum compatible with bowel perforation. Bilateral nephrolithiasis. Punctate 1-2 mm stones within the bladder or possibly at the left ureterovesical junction. No hydronephrosis. Aortoiliac atherosclerosis. Coronary artery disease. Electronically Signed   By:  Rolm Baptise M.D.   On: 12/15/2016 10:56    Labs:  CBC:  Recent Labs  12/19/16 0639 12/27/16 1049 12/28/16 0628 12/29/16 0633  WBC 14.4* 19.2* 14.8* 13.4*  HGB 13.2 12.6* 13.4 13.5  HCT 39.2 38.0* 41.0 40.9  PLT 219 269 271 284    COAGS:  Recent Labs  12/28/16 0628  INR 1.36  APTT 29    BMP:  Recent Labs  12/19/16 0639 12/27/16 1049 12/28/16 0628 12/29/16 0633  NA 139 136 136 135  K 3.4* 4.0 4.1 3.9  CL 103 102 102 102  CO2 25 24 25 23   GLUCOSE 82 206* 95 99  BUN 17 18 15 14   CALCIUM 9.1 8.9 8.6* 8.5*  CREATININE 0.72 0.87 0.78 0.84  GFRNONAA >60 >60 >60 >60  GFRAA >60 >60 >60 >60    LIVER FUNCTION TESTS:  Recent Labs  12/15/16 1031 12/17/16 0648 12/19/16 0639 12/27/16 1049  BILITOT 1.7* 1.1 0.7 0.4  AST 40 22 23 29   ALT 64* 37 36 27  ALKPHOS 68 68 69 51  PROT 7.1 7.3 7.5 6.6  ALBUMIN 3.5 3.2* 3.3* 2.9*    TUMOR MARKERS: No results for input(s): AFPTM, CEA, CA199, CHROMGRNA in the last 8760 hours.  Assessment and Plan: Abdominal abscess Patient with perforated diverticulitis and contained abscess.  IR consulted for aspiration and drainage.  Case reviewed and approved by Dr. Vernard Gambles. Patient transferred [facility] to [facility] for procedure.  RN aware of patient history of asymptomatic bradycardia.  He has been NPO.  He blood thinners have been held.  Risks and benefits discussed with the patient including bleeding, infection, damage to adjacent structures, bowel perforation/fistula connection, and sepsis. All of the patient's questions were answered, patient is agreeable to proceed. Consent signed and in chart.  Thank you for this interesting consult.  I greatly enjoyed meeting [employer]. and look forward to participating in their care.  A copy of this report was sent to the requesting provider on this date.  Electronically Signed: Docia Barrier 12/29/2016, 10:32 AM   I spent a total of 40 Minutes    in face to face in  clinical consultation, greater than 50% of which was counseling/coordinating care for abdominal abscess

## 2016-12-29 NOTE — Procedures (Signed)
CT guided drainage of colonic diverticular abscess.  Removed 30 ml of yellow purulent fluid.  Minimal blood loss and no immediate complication.

## 2016-12-29 NOTE — Sedation Documentation (Signed)
Patient is resting comfortably. 

## 2016-12-29 NOTE — Progress Notes (Signed)
  Subjective: Patient having slight suprapubic and left lower quadrant abdominal pain with bowel movement. He does say it is better than it was.  Objective: Vital [name] in last 24 hours: Temp:  [98.5 F (36.9 C)-98.6 F (37 C)] 98.6 F (37 C) (09/18 8937) Pulse Rate:  [61-71] 71 (09/18 0614) Resp:  [18] 18 (09/18 0614) BP: (117-121)/(56-59) 117/56 (09/18 0614) SpO2:  [97 %-98 %] 98 % (09/18 0614) Last BM Date: 12/29/16  Intake/Output from previous day: 09/17 0701 - 09/18 0700 In: 2565 [P.O.:240; I.V.:2175; IV Piggyback:150] Out: 400 [Urine:400] Intake/Output this shift: Total I/O In: -  Out: 300 [Urine:300]  General appearance: alert, cooperative and no distress GI: Soft, flat. Minimal discomfort in left lower quadrant palpation. No rigidity noted.  Lab Results:   Recent Labs  12/28/16 0628 12/29/16 0633  WBC 14.8* 13.4*  HGB 13.4 13.5  HCT 41.0 40.9  PLT 271 284   BMET  Recent Labs  12/28/16 0628 12/29/16 0633  NA 136 135  K 4.1 3.9  CL 102 102  CO2 25 23  GLUCOSE 95 99  BUN 15 14  CREATININE 0.78 0.84  CALCIUM 8.6* 8.5*   PT/INR  Recent Labs  12/28/16 0628  LABPROT 16.7*  INR 1.36    Studies/Results: Ct Abdomen Pelvis Wo Contrast  Result Date: 12/27/2016 CLINICAL DATA:  Diverticulitis with perforation. Persistent fever and abdominal pain. EXAM: CT ABDOMEN AND PELVIS WITHOUT CONTRAST TECHNIQUE: Multidetector CT imaging of the abdomen and pelvis was performed following the standard protocol without IV contrast. COMPARISON:  12/18/2016 FINDINGS: Lower chest: Normal Hepatobiliary: No liver lesion. Previous cholecystectomy. Benign hepatic calcifications unchanged. Pancreas: Normal Spleen: Normal Adrenals/Urinary Tract: Adrenal glands are normal. Kidneys show some cortical atrophy. Multiple small parapelvic cysts. Nonobstructing 2 mm stone in the right lower pole. Nonobstructing 4 mm stone in the left midportion. No evidence of passing stone. Bladder  appears normal. Stomach/Bowel: No evidence of bowel obstruction. Advanced sigmoid diverticulitis with perforation. 3.8 cm abscess in the central pelvis. 5 cm region of less distinct inflammatory change with edema and gas. No evidence of cul de sac abscess. Vascular/Lymphatic: Aortic atherosclerosis. No aneurysm. IVC is normal. No retroperitoneal adenopathy. Reproductive: Multiple prostate seed implants. Other: No free fluid or air. Musculoskeletal: Chronic lumbar degenerative changes. IMPRESSION: Persistent sigmoid diverticulitis with perforation. 4cm abscess immediately adjacent. 5 cm region of phlegmonous inflammation with air bubbles and edema. Electronically Signed   By: Nelson Chimes M.D.   On: 12/27/2016 11:20    Anti-infectives: Anti-infectives    Start     Dose/Rate Route Frequency Ordered Stop   12/27/16 2200  piperacillin-tazobactam (ZOSYN) IVPB 3.375 g     3.375 g 12.5 mL/hr over 240 Minutes Intravenous Every 8 hours 12/27/16 1527     12/27/16 1515  piperacillin-tazobactam (ZOSYN) IVPB 3.375 g  Status:  Discontinued     3.375 g 100 mL/hr over 30 Minutes Intravenous Every 8 hours 12/27/16 1501 12/27/16 1525   12/27/16 1230  piperacillin-tazobactam (ZOSYN) IVPB 3.375 g     3.375 g 100 mL/hr over 30 Minutes Intravenous  Once 12/27/16 1217 12/27/16 1324      Assessment/Plan: Impression: Sigmoid diverticulitis with contained abscess Plan: For percutaneous drainage of abscess today. Further management pending those results. Leukocytosis resolving.  LOS: 2 days    Kyle [name] 12/29/2016

## 2016-12-30 ENCOUNTER — Other Ambulatory Visit: Payer: Self-pay | Admitting: General Surgery

## 2016-12-30 DIAGNOSIS — K5792 Diverticulitis of intestine, part unspecified, without perforation or abscess without bleeding: Secondary | ICD-10-CM

## 2016-12-30 DIAGNOSIS — K651 Peritoneal abscess: Secondary | ICD-10-CM

## 2016-12-30 LAB — CBC
HCT: 40.3 % (ref 39.0–52.0)
HEMOGLOBIN: 13.3 g/dL (ref 13.0–17.0)
MCH: 31.1 pg (ref 26.0–34.0)
MCHC: 33 g/dL (ref 30.0–36.0)
MCV: 94.2 fL (ref 78.0–100.0)
PLATELETS: 257 10*3/uL (ref 150–400)
RBC: 4.28 MIL/uL (ref 4.22–5.81)
RDW: 13.7 % (ref 11.5–15.5)
WBC: 9 10*3/uL (ref 4.0–10.5)

## 2016-12-30 MED ORDER — AMOXICILLIN-POT CLAVULANATE 875-125 MG PO TABS: 1.0000 | ORAL_TABLET | Freq: Two times a day (BID) | ORAL | 0 refills | Status: AC

## 2016-12-30 NOTE — Progress Notes (Signed)
Referring Physician(s): Dr Isaac Bliss  Supervising Physician: Aletta Edouard  Patient Status:  [facility] Inpt  Chief Complaint:  Sigmoid diverticulitis abscess Drain placed 9/18  Subjective:  Better this am Soreness at drain site Up in bed; has ambulated in room Eating better  Allergies: Bactericin [bacitracin]; Iohexol; Ivp dye [iodinated diagnostic agents]; and Sulfa antibiotics  Medications: Prior to Admission medications   Medication Sig Start Date End Date Taking? Authorizing Provider  atenolol (TENORMIN) 25 MG tablet TAKE 1 TABLET BY MOUTH  DAILY 09/01/16  Yes Kathrynn Ducking, MD  ciprofloxacin (CIPRO) 500 MG tablet Take 1 tablet (500 mg total) by mouth 2 (two) times daily. 12/19/16  Yes Cherene Altes, MD  clobetasol (TEMOVATE) 0.05 % external solution Apply 1 application topically daily as needed (rash).  11/17/12  Yes [provider]  diclofenac (VOLTAREN) 75 MG EC tablet TAKE 1 TABLET BY MOUTH TWO  TIMES DAILY 10/19/16  Yes Kathrynn Ducking, MD  fish oil-omega-3 fatty acids 1000 MG capsule Take 1 g by mouth 3 (three) times daily.   Yes [provider]  gabapentin (NEURONTIN) 400 MG capsule Take 1 capsule (400 mg total) by mouth at bedtime. 05/18/16  Yes Kathrynn Ducking, MD  Garlic (GARLIQUE PO) Take 1 tablet by mouth daily.   Yes [provider]  levETIRAcetam (KEPPRA) 500 MG tablet TAKE 1/2 TABLET BY MOUTH IN THE MORNING AND 1 TABLET IN THE EVENING 01/23/16  Yes Kathrynn Ducking, MD  losartan (COZAAR) 25 MG tablet Take 25 mg by mouth at bedtime.  06/02/16  Yes [provider]  magnesium oxide (MAGOX 400) 400 (241.3 Mg) MG tablet Take 800 mg by mouth daily.   Yes [provider]  niacinamide 500 MG tablet Take 500 mg by mouth 3 (three) times daily.   Yes [provider]  Polyethyl Glycol-Propyl Glycol (SYSTANE) 0.4-0.3 % SOLN Place 1 drop into both eyes 3 (three) times daily as needed (for dry eyes).   Yes  [provider]  predniSONE (DELTASONE) 5 MG tablet Take 2 tablets (10 mg total) by mouth daily with breakfast. 09/24/16  Yes Kathrynn Ducking, MD  pyridostigmine (MESTINON) 60 MG tablet Take 1.5 tablets (90 mg total) by mouth 3 (three) times daily. 09/14/16  Yes Kathrynn Ducking, MD  simvastatin (ZOCOR) 20 MG tablet Take 20 mg by mouth at bedtime.  06/02/16  Yes [provider]     Vital Signs: BP 119/65 (BP Location: Left Arm)   Pulse 61   Temp (!) 97.5 F (36.4 C) (Oral)   Resp 18   Ht 5\' 8"  (1.727 m)   Wt 166 lb (75.3 kg)   SpO2 97%   BMI 25.24 kg/m   Physical Exam  Constitutional: He is oriented to person, place, and time.  Abdominal: Soft. Bowel sounds are normal. There is tenderness.  Musculoskeletal: Normal range of motion.  Neurological: He is alert and oriented to person, place, and time.  Skin: Skin is warm and dry.  Site is clean and dry NT no bleeding 30 cc OP so far 20 cc in JP Bloody fluid Drain flushes with ease  Nursing note and vitals reviewed.   Imaging: Ct Abdomen Pelvis Wo Contrast  Result Date: 12/27/2016 CLINICAL DATA:  Diverticulitis with perforation. Persistent fever and abdominal pain. EXAM: CT ABDOMEN AND PELVIS WITHOUT CONTRAST TECHNIQUE: Multidetector CT imaging of the abdomen and pelvis was performed following the standard protocol without IV contrast. COMPARISON:  12/18/2016 FINDINGS:  Lower chest: Normal Hepatobiliary: No liver lesion. Previous cholecystectomy. Benign hepatic calcifications unchanged. Pancreas: Normal Spleen: Normal Adrenals/Urinary Tract: Adrenal glands are normal. Kidneys show some cortical atrophy. Multiple small parapelvic cysts. Nonobstructing 2 mm stone in the right lower pole. Nonobstructing 4 mm stone in the left midportion. No evidence of passing stone. Bladder appears normal. Stomach/Bowel: No evidence of bowel obstruction. Advanced sigmoid diverticulitis with perforation. 3.8 cm abscess in the central  pelvis. 5 cm region of less distinct inflammatory change with edema and gas. No evidence of cul de sac abscess. Vascular/Lymphatic: Aortic atherosclerosis. No aneurysm. IVC is normal. No retroperitoneal adenopathy. Reproductive: Multiple prostate seed implants. Other: No free fluid or air. Musculoskeletal: Chronic lumbar degenerative changes. IMPRESSION: Persistent sigmoid diverticulitis with perforation. 4cm abscess immediately adjacent. 5 cm region of phlegmonous inflammation with air bubbles and edema. Electronically Signed   By: Nelson Chimes M.D.   On: 12/27/2016 11:20   Ct Image Guided Drainage By Percutaneous Catheter  Result Date: 12/30/2016 INDICATION: 82 year old with a diverticular abscess. Plan for CT-guided drain placement. EXAM: CT GUIDED DRAINAGE OF DIVERTICULAR ABSCESS MEDICATIONS: Sedation medications ANESTHESIA/SEDATION: 3.0 mg IV Versed 100 mcg IV Fentanyl Moderate Sedation Time:  26 minutes The patient was continuously monitored during the procedure by the interventional radiology nurse under my direct supervision. COMPLICATIONS: None immediate. TECHNIQUE: Informed written consent was obtained from the patient after a thorough discussion of the procedural risks, benefits and alternatives. All questions were addressed. A timeout was performed prior to the initiation of the procedure. PROCEDURE: CT images obtained in the lower abdomen and pelvis with the patient supine. The left side of the abdomen was prepped and draped in sterile fashion. Skin was anesthetized with 1% lidocaine. Using CT guidance, an 18 gauge trocar needle was directed from the lateral left abdomen into the midline abscess collection. Yellow purulent fluid was aspirated from the collection. Stiff Amplatz wire was placed. Tract was dilated to accommodate a 10 [place] multipurpose drain. 30 mL of yellow purulent fluid was removed. Catheter was attached to a suction bulb. FINDINGS: Air-fluid collection in the midline of the lower  abdomen. Multiple adjacent small pockets of extraluminal gas just caudal and posterior to the larger air-fluid collection. Drain was successfully placed within the largest air-fluid collection. 30 mL of purulent fluid was removed. The largest air-fluid collection was decompressed at the end of the procedure. IMPRESSION: CT-guided placement of a drainage catheter within the lower abdominal diverticular abscess. Electronically Signed   By: Markus Daft M.D.   On: 12/30/2016 08:44    Labs:  CBC:  Recent Labs  12/27/16 1049 12/28/16 0628 12/29/16 0633 12/30/16 0602  WBC 19.2* 14.8* 13.4* 9.0  HGB 12.6* 13.4 13.5 13.3  HCT 38.0* 41.0 40.9 40.3  PLT 269 271 284 257    COAGS:  Recent Labs  12/28/16 0628  INR 1.36  APTT 29    BMP:  Recent Labs  12/19/16 0639 12/27/16 1049 12/28/16 0628 12/29/16 0633  NA 139 136 136 135  K 3.4* 4.0 4.1 3.9  CL 103 102 102 102  CO2 25 24 25 23   GLUCOSE 82 206* 95 99  BUN 17 18 15 14   CALCIUM 9.1 8.9 8.6* 8.5*  CREATININE 0.72 0.87 0.78 0.84  GFRNONAA >60 >60 >60 >60  GFRAA >60 >60 >60 >60    LIVER FUNCTION TESTS:  Recent Labs  12/15/16 1031 12/17/16 0648 12/19/16 0639 12/27/16 1049  BILITOT 1.7* 1.1 0.7 0.4  AST 40 22 23 29   ALT  64* 37 36 27  ALKPHOS 68 68 69 51  PROT 7.1 7.3 7.5 6.6  ALBUMIN 3.5 3.2* 3.3* 2.9*    Assessment and Plan:  Diverticular abscess drain intact Afeb; wbc wnl Plan for home per MD IR OP drain clinic can follow pt if needed Would flush daily 5-10 cc sterile saline Record output Should have drain injection prior to removal  Electronically Signed: Navina Wohlers A, PA-C 12/30/2016, 11:22 AM   I spent a total of 15 Minutes at the the patient's bedside AND on the patient's hospital floor or unit, greater than 50% of which was counseling/coordinating care for abscess drain

## 2016-12-30 NOTE — Progress Notes (Signed)
Discharge instructions given at bedside

## 2016-12-30 NOTE — Care Management Important Message (Signed)
Important Message  Patient Details  Name: Kyle [name]. MRN: [id number] Date of Birth: Mar 22, 1935   Medicare Important Message Given:  Yes    Sherald Barge, RN 12/30/2016, 2:11 PM

## 2016-12-30 NOTE — Progress Notes (Signed)
Subjective: Patient has no complaints.  Objective: Vital signs in last 24 hours: Temp:  [97.5 F (36.4 C)-98 F (36.7 C)] 97.5 F (36.4 C) (09/19 0559) Pulse Rate:  [57-69] 61 (09/19 0559) Resp:  [16-18] 18 (09/19 0559) BP: (102-122)/(62-75) 119/65 (09/19 0559) SpO2:  [92 %-100 %] 97 % (09/19 0559) Last BM Date: 12/29/16  Intake/Output from previous day: 09/18 0701 - 09/19 0700 In: -  Out: 1005 [Urine:975; Drains:30] Intake/Output this shift: No intake/output data recorded.  General appearance: alert, cooperative and no distress GI: Soft, JP drain with serosanguineous fluid present.  Lab Results:   Recent Labs  12/29/16 0633 12/30/16 0602  WBC 13.4* 9.0  HGB 13.5 13.3  HCT 40.9 40.3  PLT 284 257   BMET  Recent Labs  12/28/16 0628 12/29/16 0633  NA 136 135  K 4.1 3.9  CL 102 102  CO2 25 23  GLUCOSE 95 99  BUN 15 14  CREATININE 0.78 0.84  CALCIUM 8.6* 8.5*   PT/INR  Recent Labs  12/28/16 0628  LABPROT 16.7*  INR 1.36    Studies/Results: Ct Image Guided Drainage By Percutaneous Catheter  Result Date: 12/30/2016 INDICATION: 82 year old with a diverticular abscess. Plan for CT-guided drain placement. EXAM: CT GUIDED DRAINAGE OF DIVERTICULAR ABSCESS MEDICATIONS: Sedation medications ANESTHESIA/SEDATION: 3.0 mg IV Versed 100 mcg IV Fentanyl Moderate Sedation Time:  26 minutes The patient was continuously monitored during the procedure by the interventional radiology nurse under my direct supervision. COMPLICATIONS: None immediate. TECHNIQUE: Informed written consent was obtained from the patient after a thorough discussion of the procedural risks, benefits and alternatives. All questions were addressed. A timeout was performed prior to the initiation of the procedure. PROCEDURE: CT images obtained in the lower abdomen and pelvis with the patient supine. The left side of the abdomen was prepped and draped in sterile fashion. Skin was anesthetized with 1%  lidocaine. Using CT guidance, an 18 gauge trocar needle was directed from the lateral left abdomen into the midline abscess collection. Yellow purulent fluid was aspirated from the collection. Stiff Amplatz wire was placed. Tract was dilated to accommodate a 10 [place] multipurpose drain. 30 mL of yellow purulent fluid was removed. Catheter was attached to a suction bulb. FINDINGS: Air-fluid collection in the midline of the lower abdomen. Multiple adjacent small pockets of extraluminal gas just caudal and posterior to the larger air-fluid collection. Drain was successfully placed within the largest air-fluid collection. 30 mL of purulent fluid was removed. The largest air-fluid collection was decompressed at the end of the procedure. IMPRESSION: CT-guided placement of a drainage catheter within the lower abdominal diverticular abscess. Electronically Signed   By: Markus Daft M.D.   On: 12/30/2016 08:44    Anti-infectives: Anti-infectives    Start     Dose/Rate Route Frequency Ordered Stop   12/27/16 2200  piperacillin-tazobactam (ZOSYN) IVPB 3.375 g     3.375 g 12.5 mL/hr over 240 Minutes Intravenous Every 8 hours 12/27/16 1527     12/27/16 1515  piperacillin-tazobactam (ZOSYN) IVPB 3.375 g  Status:  Discontinued     3.375 g 100 mL/hr over 30 Minutes Intravenous Every 8 hours 12/27/16 1501 12/27/16 1525   12/27/16 1230  piperacillin-tazobactam (ZOSYN) IVPB 3.375 g     3.375 g 100 mL/hr over 30 Minutes Intravenous  Once 12/27/16 1217 12/27/16 1324      Assessment/Plan: Impression: Sigmoid diverticulitis with perforation, resolving. Preliminary cultures show mixed bacteria. Leukocytosis has resolved. Plan: OK for discharge home with drain in  place. Would discharge on Augmentin for 10 days. I will see him in my office in one week.  LOS: 3 days    Aviva Signs 12/30/2016

## 2016-12-30 NOTE — Progress Notes (Signed)
Patient IV and telemetry removed, tolerated well.

## 2016-12-30 NOTE — Discharge Instructions (Signed)
Surgical Drain Home Care °Surgical drains are used to remove extra fluid that normally builds up in a surgical wound after surgery. A surgical drain helps to heal a surgical wound. Different kinds of surgical drains include: °· Active drains. These drains use suction to pull drainage away from the surgical wound. Drainage flows through a tube to a container outside of the body. It is important to keep the bulb or the drainage container flat (compressed) at all times, except while you empty it. Flattening the bulb or container creates suction. The two most common types of active drains are bulb drains and Hemovac drains. °· Passive drains. These drains allow fluid to drain naturally, by gravity. Drainage flows through a tube to a bandage (dressing) or a container outside of the body. Passive drains do not need to be emptied. The most common type of passive drain is the Penrose drain. ° °A drain is placed during surgery. Immediately after surgery, drainage is usually bright red and a little thicker than water. The drainage may gradually turn yellow or pink and become thinner. It is likely that your health care provider will remove the drain when the drainage stops or when the amount decreases to 1-2 Tbsp (15-30 mL) during a 24-hour period. °How to care for your surgical drain °· Keep the skin around the drain dry and covered with a dressing at all times. °· Check your drain area every day for signs of infection. Check for: °? More redness, swelling, or pain. °? Pus or a bad smell. °? Cloudy drainage. °Follow instructions from your health care provider about how to take care of your drain and how to change your dressing. Change your dressing at least one time every day. Change it more often if needed to keep the dressing dry. Make sure you: °1. Gather your supplies, including: °? Tape. °? Germ-free cleaning solution (sterile saline). °? Split gauze drain sponge: 4 x 4 inches (10 x 10 cm). °? Gauze square: 4 x 4 inches  (10 x 10 cm). °2. Wash your hands with soap and water before you change your dressing. If soap and water are not available, use hand sanitizer. °3. Remove the old dressing. Avoid using scissors to do that. °4. Use sterile saline to clean your skin around the drain. °5. Place the tube through the slit in a drain sponge. Place the drain sponge so that it covers your wound. °6. Place the gauze square or another drain sponge on top of the drain sponge that is on the wound. Make sure the tube is between those layers. °7. Tape the dressing to your skin. °8. If you have an active bulb or Hemovac drain, tape the drainage tube to your skin 1-2 inches (2.5-5 cm) below the place where the tube enters your body. Taping keeps the tube from pulling on any stitches (sutures) that you have. °9. Wash your hands with soap and water. °10. Write down the color of your drainage and how often you change your dressing. ° °How to empty your active bulb or Hemovac drain °1. Make sure that you have a measuring cup that you can empty your drainage into. °2. Wash your hands with soap and water. If soap and water are not available, use hand sanitizer. °3. Gently move your fingers down the tube while squeezing very lightly. This is called stripping the tube. This clears any drainage, clots, or tissue from the tube. °? Do not pull on the tube. °? You may need to strip   the tube several times every day to keep the tube clear. °4. Open the bulb cap or the drain plug. Do not touch the inside of the cap or the bottom of the plug. °5. Empty all of the drainage into the measuring cup. °6. Compress the bulb or the container and replace the cap or the plug. To compress the bulb or the container, squeeze it firmly in the middle while you close the cap or plug the container. °7. Write down the amount of drainage that you have in each 24-hour period. If you have less than 2 Tbsp (30 mL) of drainage during 24 hours, contact your health care  provider. °8. Flush the drainage down the toilet. °9. Wash your hands with soap and water. °Contact a health care provider if: °· You have more redness, swelling, or pain around your drain area. °· The amount of drainage that you have is increasing instead of decreasing. °· You have pus or a bad smell coming from your drain area. °· You have a fever. °· You have drainage that is cloudy. °· There is a sudden stop or a sudden decrease in the amount of drainage that you have. °· Your tube falls out. °· Your active drain does not stay compressed after you empty it. °This information is not intended to replace advice given to you by your health care provider. Make sure you discuss any questions you have with your health care provider. °Document Released: 03/27/2000 Document Revised: 09/05/2015 Document Reviewed: 10/17/2014 °Elsevier Interactive Patient Education © 2018 [employer] ° °

## 2016-12-30 NOTE — Care Management Note (Signed)
Case Management Note  Patient Details  Name: Kyle [name]. MRN: [id number] Date of Birth: October 09, 1934  Subjective/Objective:                  Admitted with diverticulitis. Pt seen last week. He lives with wife. Ind, not homebound, very active, has PCP, transportation, insurance with drug coverage, no difficulty affording medications/medical care. Pt communicates no needs or concerns about DC.   Action/Plan: DC home today with self care.   Expected Discharge Date:     12/30/2016             Expected Discharge Plan:  Home/Self Care  In-House Referral:  NA  Discharge planning Services  CM Consult  Post Acute Care Choice:  NA Choice offered to:  NA  Status of Service:  Completed, signed off  Sherald Barge, RN 12/30/2016, 2:11 PM

## 2016-12-30 NOTE — Discharge Summary (Signed)
Physician Discharge Summary  Kyle [name]. UVO:[id number] DOB: 06-Apr-1935 DOA: 12/27/2016  PCP: Redmond School, MD  Admit date: 12/27/2016 Discharge date: 12/30/2016  Time spent: 45 minutes  Recommendations for Outpatient Follow-up:  -Will be discharged home today. -Will need to follow up with IR and with Dr. Arnoldo Morale in 1 week.   Discharge Diagnoses:  Active Problems:   Myasthenia ([place])   Essential hypertension, benign   Bowel perforation ([facility])   Acute diverticulitis of intestine   Intra-abdominal abscess ([place])   Abscess of sigmoid colon due to diverticulitis   Discharge Condition: Stable and improved  Filed Weights   12/27/16 1025 12/27/16 1501  Weight: 72.6 kg (160 lb) 75.3 kg (166 lb)    History of present illness:  As per Dr. Carles Collet on 9/17: Kyle [name]. is a 82 y.o. male with medical history of perforated diverticulitis, essential hypertension, hyperlipidemia, ocular myasthenia gravis, and DVT no longer on anticoagulation presented with 3-4 day history of worsening abdominal pain which significantly worsened over the period of the past 24 hours. The patient was recently discharged from the hospital after a stay from 12/15/2016 through 12/19/2016 at which time, the patient was treated for perforated sigmoid diverticulitis. The patient was treated nonsurgically. He was discharged home with Cipro and Flagyl which he is due to finish on 12/28/2016. At home, the patient continued to have 4-5 loose stools per day associated with abdominal pain. He endorses compliance with his antibiotics. He denies any fevers, chills, chest pain, shortness breath, coughing, hemoptysis, nausea, vomiting, dysuria, hematuria, hematochezia, melena. Upon presentation, the patient was noted to have WBC 19.2, but he was afebrile and hemodynamically stable. The patient was started on Zosyn and IV fluids.  CT of the abdomen and pelvis showed advanced sigmoid diverticulitis with perforation and 3.8  cm abscess in the central pelvis. There was a 5 cm region of less distinct inflammatory changes with edema and gas.  Hospital Course:   Perforated diverticulitis/intra-abdominal abscess -He is status post percutaneous drainage of axis with JP drain. Cultures with multiple bacterial morphotypes. -Had been on Zosyn while in the hospital, will transition over to Augmentin for 10 days for surgical recommendations. -Has follow-up in place with both surgery and IR.  Hypertension -Well-controlled, continue losartan, atenolol.  Hyperlipidemia -Continue statin, niacin and Lovaza  Seizure disorder -Continue Keppra  Procedures:  JP drain  Consultations: IR  Gen. surgery   Discharge Instructions  Discharge Instructions    Diet - low sodium heart healthy    Complete by:  As directed    Increase activity slowly    Complete by:  As directed      Allergies as of 12/30/2016      Reactions   Bactericin [bacitracin] Other (See Comments)   Unknown reaction    Iohexol     Code: HIVES, Desc: pt was premedicated/hives reaction occurred 24 hours post injection, Onset Date: [id number]   Ivp Dye [iodinated Diagnostic Agents]    Sulfa Antibiotics Swelling, Rash      Medication List    STOP taking these medications   ciprofloxacin 500 MG tablet Commonly known as:  CIPRO   metroNIDAZOLE 500 MG tablet Commonly known as:  FLAGYL     TAKE these medications   amoxicillin-clavulanate 875-125 MG tablet Commonly known as:  AUGMENTIN Take 1 tablet by mouth 2 (two) times daily.   atenolol 25 MG tablet Commonly known as:  TENORMIN TAKE 1 TABLET BY MOUTH  DAILY   clobetasol  0.05 % external solution Commonly known as:  TEMOVATE Apply 1 application topically daily as needed (rash).   diclofenac 75 MG EC tablet Commonly known as:  VOLTAREN TAKE 1 TABLET BY MOUTH TWO  TIMES DAILY   fish oil-omega-3 fatty acids 1000 MG capsule Take 1 g by mouth 3 (three) times daily.   gabapentin 400 MG  capsule Commonly known as:  NEURONTIN Take 1 capsule (400 mg total) by mouth at bedtime.   GARLIQUE PO Take 1 tablet by mouth daily.   levETIRAcetam 500 MG tablet Commonly known as:  KEPPRA TAKE 1/2 TABLET BY MOUTH IN THE MORNING AND 1 TABLET IN THE EVENING   losartan 25 MG tablet Commonly known as:  COZAAR Take 25 mg by mouth at bedtime.   MAGOX 400 400 (241.3 Mg) MG tablet Generic drug:  magnesium oxide Take 800 mg by mouth daily.   niacinamide 500 MG tablet Take 500 mg by mouth 3 (three) times daily.   predniSONE 5 MG tablet Commonly known as:  DELTASONE Take 2 tablets (10 mg total) by mouth daily with breakfast.   pyridostigmine 60 MG tablet Commonly known as:  MESTINON Take 1.5 tablets (90 mg total) by mouth 3 (three) times daily.   simvastatin 20 MG tablet Commonly known as:  ZOCOR Take 20 mg by mouth at bedtime.   SYSTANE 0.4-0.3 % Soln Generic drug:  Polyethyl Glycol-Propyl Glycol Place 1 drop into both eyes 3 (three) times daily as needed (for dry eyes).            Discharge Care Instructions        Start     Ordered   12/30/16 0000  IR Radiologist Eval & Mgmt    Question Answer Comment  Reason for Exam (SYMPTOM  OR DIAGNOSIS REQUIRED) 7-10 day follow up post sigmoid diverticular abscess drain placement---Dr Anselm Pancoast; will need injection and CT   Preferred Imaging Location? [facility]      12/30/16 1130   12/30/16 0000  amoxicillin-clavulanate (AUGMENTIN) 875-125 MG tablet  2 times daily     12/30/16 1536   12/30/16 0000  Increase activity slowly     12/30/16 1536   12/30/16 0000  Diet - low sodium heart healthy     12/30/16 1536     Allergies  Allergen Reactions  . Bactericin [Bacitracin] Other (See Comments)    Unknown reaction   . Iohexol      Code: HIVES, Desc: pt was premedicated/hives reaction occurred 24 hours post injection, Onset Date: [id number]   . Ivp Dye [Iodinated Diagnostic Agents]   . Sulfa Antibiotics Swelling  and Rash   Follow-up Information    Aviva Signs, MD. Schedule an appointment as soon as possible for a visit on 01/05/2017.   Specialty:  General Surgery Contact information: [address] [place] [address] [phone]        Markus Daft, MD Follow up in 1 week(s).   Specialty:  Interventional Radiology Why:  call [phone] if questions or concerns; clinic will call pt with appt date and time Contact information: [place] [address] [phone]            The results of significant diagnostics from this hospitalization (including imaging, microbiology, ancillary and laboratory) are listed below for reference.    Significant Diagnostic Studies: Ct Abdomen Pelvis Wo Contrast  Result Date: 12/27/2016 CLINICAL DATA:  Diverticulitis with perforation. Persistent fever and abdominal pain. EXAM: CT ABDOMEN AND PELVIS WITHOUT  CONTRAST TECHNIQUE: Multidetector CT imaging of the abdomen and pelvis was performed following the standard protocol without IV contrast. COMPARISON:  12/18/2016 FINDINGS: Lower chest: Normal Hepatobiliary: No liver lesion. Previous cholecystectomy. Benign hepatic calcifications unchanged. Pancreas: Normal Spleen: Normal Adrenals/Urinary Tract: Adrenal glands are normal. Kidneys show some cortical atrophy. Multiple small parapelvic cysts. Nonobstructing 2 mm stone in the right lower pole. Nonobstructing 4 mm stone in the left midportion. No evidence of passing stone. Bladder appears normal. Stomach/Bowel: No evidence of bowel obstruction. Advanced sigmoid diverticulitis with perforation. 3.8 cm abscess in the central pelvis. 5 cm region of less distinct inflammatory change with edema and gas. No evidence of cul de sac abscess. Vascular/Lymphatic: Aortic atherosclerosis. No aneurysm. IVC is normal. No retroperitoneal adenopathy. Reproductive: Multiple prostate seed implants. Other: No free fluid or air. Musculoskeletal: Chronic  lumbar degenerative changes. IMPRESSION: Persistent sigmoid diverticulitis with perforation. 4cm abscess immediately adjacent. 5 cm region of phlegmonous inflammation with air bubbles and edema. Electronically Signed   By: Nelson Chimes M.D.   On: 12/27/2016 11:20   Ct Abdomen Pelvis Wo Contrast  Result Date: 12/18/2016 CLINICAL DATA:  Inpatient. Recent diagnosis of perforated sigmoid diverticulitis, now presenting with abdominal pain and fever. EXAM: CT ABDOMEN AND PELVIS WITHOUT CONTRAST TECHNIQUE: Multidetector CT imaging of the abdomen and pelvis was performed following the standard protocol without IV contrast. COMPARISON:  12/15/2016 CT abdomen/ pelvis. FINDINGS: Lower chest: No significant pulmonary nodules or acute consolidative airspace disease. Coronary atherosclerosis. Hepatobiliary: Normal liver size. Stable granulomatous inferior right liver lobe calcification. No liver mass. Cholecystectomy. No biliary ductal dilatation. Pancreas: Normal, with no mass or duct dilation. Spleen: Normal size. No mass. Adrenals/Urinary Tract: Normal adrenals. Nonobstructing 3 mm interpolar right renal stone. Nonobstructing 4 mm interpolar left renal stone. Small simple parapelvic renal cysts in both kidneys, unchanged. No hydronephrosis. No contour deforming renal mass. Normal bladder. Stomach/Bowel: Grossly normal stomach. Normal caliber small bowel with no small bowel wall thickening. The appendix is mildly dilated (9 mm diameter), unchanged. There is new mild wall thickening in the distal appendix, probably reactive. Moderate sigmoid diverticulosis. Mild segmental wall thickening throughout the mid sigmoid colon. There is extensive phlegmonous change in the sigmoid mesentery with extensive pericolonic free air, ill-defined fluid and fat stranding. The amount of free air is slightly decreased in the interval, particularly in the upper perirectal and para-aortic regions. The amount of fat stranding and ill-defined fluid  in the sigmoid mesentery is increased. No well-defined measurable focal fluid collection. Oral contrast progresses to the ascending colon. No additional sites of large bowel wall thickening. Vascular/Lymphatic: Atherosclerotic nonaneurysmal abdominal aorta. No pathologically enlarged lymph nodes in the abdomen or pelvis. Reproductive: Normal size prostate. Brachytherapy seeds are again noted in the prostate. Other: No ascites. Musculoskeletal: No aggressive appearing focal osseous lesions. Marked lumbar spondylosis. IMPRESSION: 1. Perforated sigmoid diverticulitis, with mild interval worsening of extensive phlegmonous changes in the sigmoid mesentery. No well-defined measurable/drainable abscess at this time. 2. Additional findings include Aortic Atherosclerosis (ICD10-I70.0), coronary atherosclerosis and nonobstructing bilateral nephrolithiasis. Electronically Signed   By: Ilona Sorrel M.D.   On: 12/18/2016 10:55   Ct Abdomen Pelvis Wo Contrast  Result Date: 12/15/2016 CLINICAL DATA:  Abdominal pain, diverticulitis suspected EXAM: CT ABDOMEN AND PELVIS WITHOUT CONTRAST TECHNIQUE: Multidetector CT imaging of the abdomen and pelvis was performed following the standard protocol without IV contrast. COMPARISON:  None. FINDINGS: Lower chest: Lung bases are clear. No effusions. Heart is normal size. Calcifications in the visualize coronary arteries. Hepatobiliary: Scattered  calcifications in the liver compatible with old granulomatous disease. Prior cholecystectomy. Pancreas: No focal abnormality or ductal dilatation. Spleen: No focal abnormality.  Normal size. Adrenals/Urinary Tract: Bilateral renal parapelvic cysts. No hydronephrosis. Two calcifications noted in the bladder or possibly at the left ureterovesical junction. These are punctate 1-2 mm stones. Bilateral nonobstructing renal stones. Adrenal glands unremarkable. Stomach/Bowel: Extensive diverticular disease in the sigmoid colon. Extensive stranding and  extraluminal gas noted throughout the pelvis adjacent to the sigmoid colon, extending superiorly in the retroperitoneum compatible with perforation. No fluid collection. No evidence of bowel obstruction. Vascular/Lymphatic: Aortic and iliac calcifications. No aneurysm or adenopathy. Reproductive: Prostate radiation seeds in place. Other: No free fluid. Musculoskeletal: Degenerative changes in the lumbar spine. No acute bony abnormality. IMPRESSION: Sigmoid diverticulosis with extensive changes of active diverticulitis and moderate to large amount of extraluminal gas in the soft tissues surrounding the sigmoid colon and dissecting superiorly in the retroperitoneum compatible with bowel perforation. Bilateral nephrolithiasis. Punctate 1-2 mm stones within the bladder or possibly at the left ureterovesical junction. No hydronephrosis. Aortoiliac atherosclerosis. Coronary artery disease. Electronically Signed   By: Rolm Baptise M.D.   On: 12/15/2016 10:56   Ct Image Guided Drainage By Percutaneous Catheter  Result Date: 12/30/2016 INDICATION: 82 year old with a diverticular abscess. Plan for CT-guided drain placement. EXAM: CT GUIDED DRAINAGE OF DIVERTICULAR ABSCESS MEDICATIONS: Sedation medications ANESTHESIA/SEDATION: 3.0 mg IV Versed 100 mcg IV Fentanyl Moderate Sedation Time:  26 minutes The patient was continuously monitored during the procedure by the interventional radiology nurse under my direct supervision. COMPLICATIONS: None immediate. TECHNIQUE: Informed written consent was obtained from the patient after a thorough discussion of the procedural risks, benefits and alternatives. All questions were addressed. A timeout was performed prior to the initiation of the procedure. PROCEDURE: CT images obtained in the lower abdomen and pelvis with the patient supine. The left side of the abdomen was prepped and draped in sterile fashion. Skin was anesthetized with 1% lidocaine. Using CT guidance, an 18 gauge  trocar needle was directed from the lateral left abdomen into the midline abscess collection. Yellow purulent fluid was aspirated from the collection. Stiff Amplatz wire was placed. Tract was dilated to accommodate a 10 [place] multipurpose drain. 30 mL of yellow purulent fluid was removed. Catheter was attached to a suction bulb. FINDINGS: Air-fluid collection in the midline of the lower abdomen. Multiple adjacent small pockets of extraluminal gas just caudal and posterior to the larger air-fluid collection. Drain was successfully placed within the largest air-fluid collection. 30 mL of purulent fluid was removed. The largest air-fluid collection was decompressed at the end of the procedure. IMPRESSION: CT-guided placement of a drainage catheter within the lower abdominal diverticular abscess. Electronically Signed   By: Markus Daft M.D.   On: 12/30/2016 08:44    Microbiology: Recent Results (from the past 240 hour(s))  Urine culture     Status: None   Collection Time: 12/27/16 11:05 AM  Result Value Ref Range Status   Specimen Description URINE, CLEAN CATCH  Final   Special Requests Normal  Final   Culture   Final    NO GROWTH Performed at [facility], [address], [place], [place] [id number]    Report Status 12/29/2016 FINAL  Final  Aerobic/Anaerobic Culture (surgical/deep wound)     Status: None (Preliminary result)   Collection Time: 12/29/16  3:13 PM  Result Value Ref Range Status   Specimen Description ABSCESS  Final   Special Requests DIVERTICULAR  Final  Gram Stain   Final    ABUNDANT WBC PRESENT, PREDOMINANTLY PMN MODERATE GRAM VARIABLE ROD RARE GRAM NEGATIVE RODS RARE GRAM POSITIVE RODS    Culture CULTURE REINCUBATED FOR BETTER GROWTH  Final   Report Status PENDING  Incomplete     Labs: Basic Metabolic Panel:  Recent Labs Lab 12/27/16 1049 12/28/16 0628 12/29/16 0633  NA 136 136 135  K 4.0 4.1 3.9  CL 102 102 102  CO2 24 25 23   GLUCOSE 206* 95 99  BUN 18  15 14   CREATININE 0.87 0.78 0.84  CALCIUM 8.9 8.6* 8.5*   Liver Function Tests:  Recent Labs Lab 12/27/16 1049  AST 29  ALT 27  ALKPHOS 51  BILITOT 0.4  PROT 6.6  ALBUMIN 2.9*    Recent Labs Lab 12/27/16 1049  LIPASE 36   No results for input(s): AMMONIA in the last 168 hours. CBC:  Recent Labs Lab 12/27/16 1049 12/28/16 0628 12/29/16 0633 12/30/16 0602  WBC 19.2* 14.8* 13.4* 9.0  NEUTROABS 16.7*  --   --   --   HGB 12.6* 13.4 13.5 13.3  HCT 38.0* 41.0 40.9 40.3  MCV 93.6 94.7 93.8 94.2  PLT 269 271 284 257   Cardiac Enzymes: No results for input(s): CKTOTAL, CKMB, CKMBINDEX, TROPONINI in the last 168 hours. BNP: BNP (last 3 results) No results for input(s): BNP in the last 8760 hours.  ProBNP (last 3 results) No results for input(s): PROBNP in the last 8760 hours.  CBG: No results for input(s): GLUCAP in the last 168 hours.     SignedLelon Frohlich  [facility] Pager: [phone] 12/30/2016, 3:37 PM

## 2016-12-31 ENCOUNTER — Ambulatory Visit: Payer: Medicare Other | Admitting: General Surgery

## 2017-01-01 ENCOUNTER — Encounter (HOSPITAL_COMMUNITY): Payer: Self-pay | Admitting: Emergency Medicine

## 2017-01-01 ENCOUNTER — Emergency Department (HOSPITAL_COMMUNITY)
Admission: EM | Admit: 2017-01-01 | Discharge: 2017-01-01 | Disposition: A | Discharge status: Home / Self Care | Payer: Medicare Other | Attending: Emergency Medicine | Admitting: Emergency Medicine

## 2017-01-01 DIAGNOSIS — Z9889 Other specified postprocedural states: Secondary | ICD-10-CM | POA: Insufficient documentation

## 2017-01-01 DIAGNOSIS — T85698A Other mechanical complication of other specified internal prosthetic devices, implants and grafts, initial encounter: Secondary | ICD-10-CM | POA: Diagnosis not present

## 2017-01-01 DIAGNOSIS — I10 Essential (primary) hypertension: Secondary | ICD-10-CM | POA: Insufficient documentation

## 2017-01-01 DIAGNOSIS — Y658 Other specified misadventures during surgical and medical care: Secondary | ICD-10-CM | POA: Insufficient documentation

## 2017-01-01 DIAGNOSIS — Z79899 Other long term (current) drug therapy: Secondary | ICD-10-CM | POA: Insufficient documentation

## 2017-01-01 DIAGNOSIS — Z8546 Personal history of malignant neoplasm of prostate: Secondary | ICD-10-CM | POA: Insufficient documentation

## 2017-01-01 NOTE — ED Triage Notes (Signed)
Pt had abdominal surgery 12/29/16 with a drain placement. Pt was flushing the drain at home as instructed, and the flush site broke. No irritation/redness/pain to the surgical site.

## 2017-01-01 NOTE — Discharge Instructions (Signed)
Dr. Kathlene Cote office will call you in the morning to have an appointment for drain replacement. Return for other concerns. Keep dressing on until seen.

## 2017-01-01 NOTE — ED Notes (Signed)
ED Provider at bedside. 

## 2017-01-01 NOTE — ED Provider Notes (Signed)
[place] DEPT Provider Note   CSN: [id number] Arrival date & time: 01/01/17  1545     History   Chief Complaint Chief Complaint  Patient presents with  . Post-op Problem    HPI Kyle [name]. is a 82 y.o. male with medical history of perforated diverticulitis, essential hypertension,and hyperlipidemia who presents emergency department today for JP drain dysfunction. Patient was recently discharged from the hospital on 12/30/2016 after patient was found to have a perforated diverticulitis with intraabdominal abscess. The patient had interventional radiology drain the abscess percutaneously and a JP drain was placed by Dr. Arnoldo Morale on 12/29/16. The patient successfully drained approximately 12.5 mL of milky fluid yesterday evening at approximately 10 PM. The patient was trying to flush the tube today at 1pm and a please of plastic that connects the drainage catheter to the tubing for the collection bulb broke. There is approximately 12.5 mL of fluid still in the bulb, milky fluid in appearance. He denies fever, chills, abdominal pain, nausea, vomiting, or redness/heat/drainge around JP drain site.  HPI  Past Medical History:  Diagnosis Date  . Arthritis   . Auto immune neutropenia (HCC)   . Bullous pemphigoid   . Cancer [facility])    Prostate  . Cervical spondylosis without myelopathy 01/22/2016  . DVT (deep venous thrombosis) ([place])    a. Has had a previous right lower extremity DVT in the setting of hospitalization and surgery (after his brain tumor was resected in 1993) but is no longer on Coumadin.  Marland Kitchen Dyslipidemia   . History of prostate cancer   . Hypertension   . Meningioma ([place])    a. s/p resection 1990s.  . Myasthenia ([place]) 02/14/2013  . Nocturnal leg cramps   . Ocular myasthenia gravis ([place])   . Seizures ([place])   . Sinus bradycardia   . Sleep paralysis   . Thyroid nodule, cold     Patient Active Problem List   Diagnosis Date Noted  . Intra-abdominal abscess ([place])    . Abscess of sigmoid colon due to diverticulitis   . Acute diverticulitis of intestine 12/27/2016  . Bowel perforation ([place])   . Perforation and abscess of large intestine concurrent with and due to diverticulitis 12/15/2016  . Blood in stool   . Second degree hemorrhoids   . Diverticulosis of large intestine without diverticulitis   . Essential hypertension, benign 02/03/2016  . Cervical spondylosis without myelopathy 01/22/2016  . Nocturnal leg cramps 07/24/2015  . Nontoxic multinodular goiter 01/31/2015  . Elevated transaminase level 10/26/2014  . Thrombocytopenia ([place]) 10/25/2014  . Myasthenia ([place]) 02/14/2013  . Convulsions/seizures ([place]) 02/14/2013    Past Surgical History:  Procedure Laterality Date  . BRAIN SURGERY    . CHOLECYSTECTOMY    . COLONOSCOPY N/A 07/25/2013   Procedure: COLONOSCOPY;  Surgeon: Jamesetta So, MD;  [facility];  Service: Gastroenterology;  Laterality: N/A;  . COLONOSCOPY N/A 11/10/2016   Procedure: COLONOSCOPY;  Surgeon: Aviva Signs, MD;  Location: [facility] ENDO [facility];  Service: Gastroenterology;  Laterality: N/A;  . RADIOACTIVE SEED IMPLANT         Home Medications    Prior to Admission medications   Medication Sig Start Date End Date Taking? Authorizing Provider  amoxicillin-clavulanate (AUGMENTIN) 875-125 MG tablet Take 1 tablet by mouth 2 (two) times daily. 12/30/16 01/09/17  Isaac Bliss, Rayford Halsted, MD  atenolol (TENORMIN) 25 MG tablet TAKE 1 TABLET BY MOUTH  DAILY 09/01/16   Kathrynn Ducking, MD  clobetasol (TEMOVATE)  0.05 % external solution Apply 1 application topically daily as needed (rash).  11/17/12   [provider]  diclofenac (VOLTAREN) 75 MG EC tablet TAKE 1 TABLET BY MOUTH TWO  TIMES DAILY 10/19/16   Kathrynn Ducking, MD  fish oil-omega-3 fatty acids 1000 MG capsule Take 1 g by mouth 3 (three) times daily.    [provider]  gabapentin (NEURONTIN) 400 MG capsule Take 1 capsule (400 mg total) by mouth at  bedtime. 05/18/16   Kathrynn Ducking, MD  Garlic (GARLIQUE PO) Take 1 tablet by mouth daily.    [provider]  levETIRAcetam (KEPPRA) 500 MG tablet TAKE 1/2 TABLET BY MOUTH IN THE MORNING AND 1 TABLET IN THE EVENING 01/23/16   Kathrynn Ducking, MD  losartan (COZAAR) 25 MG tablet Take 25 mg by mouth at bedtime.  06/02/16   [provider]  magnesium oxide (MAGOX 400) 400 (241.3 Mg) MG tablet Take 800 mg by mouth daily.    [provider]  niacinamide 500 MG tablet Take 500 mg by mouth 3 (three) times daily.    [provider]  Polyethyl Glycol-Propyl Glycol (SYSTANE) 0.4-0.3 % SOLN Place 1 drop into both eyes 3 (three) times daily as needed (for dry eyes).    [provider]  predniSONE (DELTASONE) 5 MG tablet Take 2 tablets (10 mg total) by mouth daily with breakfast. 09/24/16   Kathrynn Ducking, MD  pyridostigmine (MESTINON) 60 MG tablet Take 1.5 tablets (90 mg total) by mouth 3 (three) times daily. 09/14/16   Kathrynn Ducking, MD  simvastatin (ZOCOR) 20 MG tablet Take 20 mg by mouth at bedtime.  06/02/16   [provider]    Family History Family History  Problem Relation Age of Onset  . Heart failure Mother   . Diabetes Mother   . Diabetes Sister   . Lung cancer Son   . Heart failure Daughter   . Colon cancer Neg Hx     Social History Social History  Substance Use Topics  . Smoking status: Never Smoker  . Smokeless tobacco: Never Used  . Alcohol use No     Allergies   Bactericin [bacitracin]; Iohexol; Ivp dye [iodinated diagnostic agents]; and Sulfa antibiotics   Review of Systems Review of Systems  Constitutional: Negative for chills and fever.  Gastrointestinal: Negative for abdominal pain, diarrhea, nausea and vomiting.  Skin: Positive for wound.  Neurological: Negative for weakness.  All other systems reviewed and are negative.    Physical Exam Updated Vital Signs BP (!) 148/88 (BP Location: Right Arm)    Pulse 87   Temp 98 F (36.7 C) (Oral)   SpO2 100%   Physical Exam  Constitutional: He appears well-developed and well-nourished.  HENT:  Head: Normocephalic and atraumatic.  Right Ear: External ear normal.  Left Ear: External ear normal.  Eyes: Conjunctivae are normal. Right eye exhibits no discharge. Left eye exhibits no discharge. No scleral icterus.  Pulmonary/Chest: Effort normal. No respiratory distress.  Abdominal: Soft. There is no tenderness.  Neurological: He is alert.  Skin: Skin is warm and dry. No erythema. No pallor.  Patient drainage tube stitched and secure to skin. No surronding heat or discharge or other signs of infection. Luer-Lock cap appears to have broken. Is still attached by suture string as shown below. Will not reattached manually. Minimal milky drainage for broken are of tubing as below. Bulb remains with 12.17ml of milky substance.   Psychiatric: He has a  normal mood and affect.  Nursing note and vitals reviewed.      ED Treatments / Results  Labs (all labs ordered are listed, but only abnormal results are displayed) Labs Reviewed - No data to display  EKG  EKG Interpretation None       Radiology No results found.  Procedures Procedures (including critical care time)  Medications Ordered in ED Medications - No data to display   Initial Impression / Assessment and Plan / ED Course  I have reviewed the triage vital signs and the nursing notes.  Pertinent labs & imaging results that were available during my care of the patient were reviewed by me and considered in my medical decision making (see chart for details).     82 y.o. male recently discharged from the hospital on 12/30/2016 for perforated diverticulitis with intraabdominal abscess with a JP drain placement (placed by Dr. Arnoldo Morale on 12/29/16) presenting for damage to JP drain. Patient was attempting to drain the JP drain when this occurred. Picture above with description. No  surrounding signs of infection around JP drain. He denies systemic signs of illness and vital signs are reassuring. Consult to IR placed and Dr. Kathlene Cote advised he will replace tomorrow. Asked to place dressing over are of malfunction with gauze and inform the patient not to take dressing off. Outpatient office will call the patient in the morning around 8am. Advised the patient does not need to be NPO. JP drain malfunction dressed and patient informed of plan. They are in agreement with plan. Patient case seen and discussed with Dr. Ellender Hose who is in agreement with plan. Patient appears safe for d/c.   Final Clinical Impressions(s) / ED Diagnoses   Final diagnoses:  JP drain, broken, initial encounter    New Prescriptions New Prescriptions   No medications on file     Lorelle Gibbs 01/01/17 2230    Duffy Bruce, MD 01/02/17 1113

## 2017-01-02 ENCOUNTER — Other Ambulatory Visit (HOSPITAL_COMMUNITY): Payer: Self-pay | Admitting: Interventional Radiology

## 2017-01-02 ENCOUNTER — Ambulatory Visit (HOSPITAL_COMMUNITY)
Admission: RE | Admit: 2017-01-02 | Discharge: 2017-01-02 | Disposition: A | Discharge status: Home / Self Care | Payer: Medicare Other | Source: Ambulatory Visit | Attending: Interventional Radiology | Admitting: Interventional Radiology

## 2017-01-02 ENCOUNTER — Encounter (HOSPITAL_COMMUNITY): Payer: Self-pay | Admitting: Interventional Radiology

## 2017-01-02 DIAGNOSIS — T85618A Breakdown (mechanical) of other specified internal prosthetic devices, implants and grafts, initial encounter: Secondary | ICD-10-CM | POA: Insufficient documentation

## 2017-01-02 DIAGNOSIS — Z9889 Other specified postprocedural states: Secondary | ICD-10-CM

## 2017-01-02 DIAGNOSIS — K5780 Diverticulitis of intestine, part unspecified, with perforation and abscess without bleeding: Secondary | ICD-10-CM | POA: Insufficient documentation

## 2017-01-02 DIAGNOSIS — Y828 Other medical devices associated with adverse incidents: Secondary | ICD-10-CM | POA: Diagnosis not present

## 2017-01-02 DIAGNOSIS — T85898A Other specified complication of other internal prosthetic devices, implants and grafts, initial encounter: Secondary | ICD-10-CM | POA: Diagnosis not present

## 2017-01-02 HISTORY — PX: IR CATHETER TUBE CHANGE: IMG717

## 2017-01-02 MED ORDER — IOPAMIDOL (ISOVUE-300) INJECTION 61%
INTRAVENOUS | Status: AC
  Administered 2017-01-02: 5 mL
  Filled 2017-01-02: qty 50

## 2017-01-02 MED ORDER — LIDOCAINE HCL 1 % IJ SOLN
INTRAMUSCULAR | Status: DC | PRN
  Administered 2017-01-02: 5 mL

## 2017-01-02 MED ORDER — LIDOCAINE HCL (PF) 1 % IJ SOLN
INTRAMUSCULAR | Status: AC
  Filled 2017-01-02: qty 10

## 2017-01-04 ENCOUNTER — Telehealth: Payer: Self-pay | Admitting: Radiology

## 2017-01-04 NOTE — Telephone Encounter (Signed)
Called 13 hour prep to [employer]. Called pt to give instructions. Pt states he understands.

## 2017-01-04 NOTE — Patient Outreach (Signed)
Outreach patient after ED visit on 01/01/17.  There was no answer but I left a voicemail message asking for a return call.  A letter will be mailed on today that will include [facility] Brochure explaining services and 24 Hour Nurse Advice Line magnet.

## 2017-01-06 ENCOUNTER — Other Ambulatory Visit: Payer: Self-pay | Admitting: Neurology

## 2017-01-06 ENCOUNTER — Other Ambulatory Visit: Payer: Self-pay | Admitting: General Surgery

## 2017-01-06 ENCOUNTER — Ambulatory Visit
Admission: RE | Admit: 2017-01-06 | Discharge: 2017-01-06 | Disposition: A | Discharge status: Home / Self Care | Payer: Medicare Other | Source: Ambulatory Visit | Attending: Radiology | Admitting: Radiology

## 2017-01-06 ENCOUNTER — Ambulatory Visit
Admission: RE | Admit: 2017-01-06 | Discharge: 2017-01-06 | Disposition: A | Discharge status: Home / Self Care | Payer: Medicare Other | Source: Ambulatory Visit | Attending: General Surgery | Admitting: General Surgery

## 2017-01-06 DIAGNOSIS — K5792 Diverticulitis of intestine, part unspecified, without perforation or abscess without bleeding: Secondary | ICD-10-CM

## 2017-01-06 DIAGNOSIS — K5720 Diverticulitis of large intestine with perforation and abscess without bleeding: Secondary | ICD-10-CM | POA: Diagnosis not present

## 2017-01-06 DIAGNOSIS — K5780 Diverticulitis of intestine, part unspecified, with perforation and abscess without bleeding: Secondary | ICD-10-CM | POA: Diagnosis not present

## 2017-01-06 DIAGNOSIS — K5730 Diverticulosis of large intestine without perforation or abscess without bleeding: Secondary | ICD-10-CM | POA: Diagnosis not present

## 2017-01-06 DIAGNOSIS — Z4659 Encounter for fitting and adjustment of other gastrointestinal appliance and device: Secondary | ICD-10-CM | POA: Diagnosis not present

## 2017-01-06 HISTORY — PX: IR RADIOLOGIST EVAL & MGMT: IMG5224

## 2017-01-06 LAB — AEROBIC/ANAEROBIC CULTURE (SURGICAL/DEEP WOUND)

## 2017-01-06 LAB — AEROBIC/ANAEROBIC CULTURE W GRAM STAIN (SURGICAL/DEEP WOUND)

## 2017-01-06 MED ORDER — IOPAMIDOL (ISOVUE-300) INJECTION 61%
100.0000 mL | Freq: Once | INTRAVENOUS | Status: AC | PRN
Start: 2017-01-06 — End: 2017-01-06
  Administered 2017-01-06: 50 mL via INTRAVENOUS

## 2017-01-06 NOTE — Progress Notes (Signed)
Referring Physician(s): Dr Mickeal Needy  Chief Complaint: The patient is seen in follow up today s/p  Diverticular abscess drain placed 9/18 in IR Exchanged 9/22 ---secondary break in catheter  History of present illness:  Doing well Denies N/V fever Denies pain Taking Cipro po per Dr Arnoldo Morale Minimal output of drain Scheduled for CT scan for recheck today   Past Medical History:  Diagnosis Date  . Arthritis   . Auto immune neutropenia (HCC)   . Bullous pemphigoid   . Cancer [facility])    Prostate  . Cervical spondylosis without myelopathy 01/22/2016  . DVT (deep venous thrombosis) ([place])    a. Has had a previous right lower extremity DVT in the setting of hospitalization and surgery (after his brain tumor was resected in 1993) but is no longer on Coumadin.  Marland Kitchen Dyslipidemia   . History of prostate cancer   . Hypertension   . Meningioma ([place])    a. s/p resection 1990s.  . Myasthenia ([place]) 02/14/2013  . Nocturnal leg cramps   . Ocular myasthenia gravis ([place])   . Seizures ([place])   . Sinus bradycardia   . Sleep paralysis   . Thyroid nodule, cold     Past Surgical History:  Procedure Laterality Date  . BRAIN SURGERY    . CHOLECYSTECTOMY    . COLONOSCOPY N/A 07/25/2013   Procedure: COLONOSCOPY;  Surgeon: Jamesetta So, MD;  [facility];  Service: Gastroenterology;  Laterality: N/A;  . COLONOSCOPY N/A 11/10/2016   Procedure: COLONOSCOPY;  Surgeon: Aviva Signs, MD;  Location[facility];  Service: Gastroenterology;  Laterality: N/A;  . IR CATHETER TUBE CHANGE  01/02/2017  . RADIOACTIVE SEED IMPLANT      Allergies: Iodinated diagnostic agents; Sulfa antibiotics; and Bactericin [bacitracin]  Medications: Prior to Admission medications   Medication Sig Start Date End Date Taking? Authorizing Provider  amoxicillin-clavulanate (AUGMENTIN) 875-125 MG tablet Take 1 tablet by mouth 2 (two) times daily. 12/30/16 01/09/17  Isaac Bliss, Rayford Halsted, MD  atenolol  (TENORMIN) 25 MG tablet TAKE 1 TABLET BY MOUTH  DAILY 09/01/16   Kathrynn Ducking, MD  clobetasol (TEMOVATE) 0.05 % external solution Apply 1 application topically daily as needed (rash).  11/17/12   [provider]  diclofenac (VOLTAREN) 75 MG EC tablet TAKE 1 TABLET BY MOUTH TWO  TIMES DAILY 10/19/16   Kathrynn Ducking, MD  fish oil-omega-3 fatty acids 1000 MG capsule Take 1 g by mouth 3 (three) times daily.    [provider]  gabapentin (NEURONTIN) 400 MG capsule Take 1 capsule (400 mg total) by mouth at bedtime. 05/18/16   Kathrynn Ducking, MD  Garlic (GARLIQUE PO) Take 1 tablet by mouth daily.    [provider]  levETIRAcetam (KEPPRA) 500 MG tablet TAKE 1/2 TABLET BY MOUTH IN THE MORNING AND 1 TABLET IN THE EVENING 01/06/17   Kathrynn Ducking, MD  losartan (COZAAR) 25 MG tablet Take 25 mg by mouth at bedtime.  06/02/16   [provider]  magnesium oxide (MAGOX 400) 400 (241.3 Mg) MG tablet Take 800 mg by mouth daily.    [provider]  niacinamide 500 MG tablet Take 500 mg by mouth 3 (three) times daily.    [provider]  Polyethyl Glycol-Propyl Glycol (SYSTANE) 0.4-0.3 % SOLN Place 1 drop into both eyes 3 (three) times daily as needed (for dry eyes).    [provider]  predniSONE (DELTASONE) 5 MG tablet Take 2 tablets (  10 mg total) by mouth daily with breakfast. 09/24/16   Kathrynn Ducking, MD  pyridostigmine (MESTINON) 60 MG tablet Take 1.5 tablets (90 mg total) by mouth 3 (three) times daily. 09/14/16   Kathrynn Ducking, MD  simvastatin (ZOCOR) 20 MG tablet Take 20 mg by mouth at bedtime.  06/02/16   [provider]     Family History  Problem Relation Age of Onset  . Heart failure Mother   . Diabetes Mother   . Diabetes Sister   . Lung cancer Son   . Heart failure Daughter   . Colon cancer Neg Hx     Social History   Social History  . Marital status: Married    Spouse name: N/A  . Number of children: 1  .  Years of education: 84 [place]   Occupational History  . retired    Social History Main Topics  . Smoking status: Never Smoker  . Smokeless tobacco: Never Used  . Alcohol use No  . Drug use: No  . Sexual activity: Not on file   Other Topics Concern  . Not on file   Social History Narrative   Patient is right handed.   Patient drinks 1 cup of coffee daily.     Vital Signs: BP (!) 146/70 (BP Location: Right Arm)   Pulse (!) 56   Temp 97.8 F (36.6 C)   SpO2 100%   Physical Exam  Constitutional: He is oriented to person, place, and time.  Abdominal: Soft. Bowel sounds are normal.  Musculoskeletal: Normal range of motion.  Neurological: He is alert and oriented to person, place, and time.  Skin: Skin is warm and dry.  Skin site is clean and dry NT no bleeding Output serous color Little in JP  Injection does reveal fistula to bowel per Dr Kathlene Cote  Psychiatric: He has a normal mood and affect. His behavior is normal.  Nursing note and vitals reviewed.  CT shows resolution of abscess per Dr Kathlene Cote  Imaging: No results found.  Labs:  CBC:  Recent Labs  12/27/16 1049 12/28/16 0628 12/29/16 0633 12/30/16 0602  WBC 19.2* 14.8* 13.4* 9.0  HGB 12.6* 13.4 13.5 13.3  HCT 38.0* 41.0 40.9 40.3  PLT 269 271 284 257    COAGS:  Recent Labs  12/28/16 0628  INR 1.36  APTT 29    BMP:  Recent Labs  12/19/16 0639 12/27/16 1049 12/28/16 0628 12/29/16 0633  NA 139 136 136 135  K 3.4* 4.0 4.1 3.9  CL 103 102 102 102  CO2 25 24 25 23   GLUCOSE 82 206* 95 99  BUN 17 18 15 14   CALCIUM 9.1 8.9 8.6* 8.5*  CREATININE 0.72 0.87 0.78 0.84  GFRNONAA >60 >60 >60 >60  GFRAA >60 >60 >60 >60    LIVER FUNCTION TESTS:  Recent Labs  12/15/16 1031 12/17/16 0648 12/19/16 0639 12/27/16 1049  BILITOT 1.7* 1.1 0.7 0.4  AST 40 22 23 29   ALT 64* 37 36 27  ALKPHOS 68 68 69 51  PROT 7.1 7.3 7.5 6.6  ALBUMIN 3.5 3.2* 3.3* 2.9*    Assessment:  Diverticular  abscess CT shows abscess resolution +fistula per drain injection Change JP to gravity bag Discontinue flushes Recheck drain injection only in 2 weeks (no need for premedication)  Signed: Doloris Servantes A, PA-C 01/06/2017, 1:35 PM   Please refer to Dr. Kathlene Cote attestation of this note for management and plan.

## 2017-01-07 ENCOUNTER — Ambulatory Visit (INDEPENDENT_AMBULATORY_CARE_PROVIDER_SITE_OTHER): Payer: Medicare Other | Admitting: General Surgery

## 2017-01-07 ENCOUNTER — Encounter: Payer: Self-pay | Admitting: General Surgery

## 2017-01-07 VITALS — BP 133/73 | HR 96 | Temp 97.5°F | Resp 18 | Ht 68.0 in | Wt 165.0 lb

## 2017-01-07 DIAGNOSIS — K5732 Diverticulitis of large intestine without perforation or abscess without bleeding: Secondary | ICD-10-CM | POA: Diagnosis not present

## 2017-01-07 NOTE — Progress Notes (Signed)
Subjective:     Kyle [name].  Status post percutaneous drainage of intra-abdominal abscess secondary to perforated sigmoid diverticulitis. Patient underwent CT scan yesterday by interventional radiology in [place]. They have left the drainage tube in place and will be seeing him again in 2 weeks for follow-up. He does complain of a rash and he thinks it secondary to the contrast that was used during the procedure. He was given prednisone prior to the procedure. He is continuing to take his ciprofloxacin. He currently has no pain. He denies any fevers. Objective:    BP 133/73   Pulse 96   Temp (!) 97.5 F (36.4 C)   Resp 18   Ht 5\' 8"  (1.727 m)   Wt 165 lb (74.8 kg)   BMI 25.09 kg/m   General:  alert, cooperative and no distress  Abdomen soft, nontender, nondistended. Drainage tube in place in left lower quadrant.     Assessment:    Sigmoid colon diverticulitis with abscess secondary to perforation, currently with drainage catheter in place.    Plan:   Triamcinolone topical cream prescribed for the rash. Finish ciprofloxacin as prescribed. To follow-up with interventional radiology in 2 weeks. Will follow peripherally as needed.

## 2017-01-12 ENCOUNTER — Ambulatory Visit (INDEPENDENT_AMBULATORY_CARE_PROVIDER_SITE_OTHER): Payer: Medicare Other | Admitting: Neurology

## 2017-01-12 ENCOUNTER — Encounter: Payer: Self-pay | Admitting: Neurology

## 2017-01-12 ENCOUNTER — Ambulatory Visit: Payer: Medicare Other | Admitting: Neurology

## 2017-01-12 VITALS — BP 141/69 | HR 65 | Ht 68.0 in | Wt 173.5 lb

## 2017-01-12 DIAGNOSIS — R561 Post traumatic seizures: Secondary | ICD-10-CM

## 2017-01-12 DIAGNOSIS — G7000 Myasthenia gravis without (acute) exacerbation: Secondary | ICD-10-CM | POA: Diagnosis not present

## 2017-01-12 MED ORDER — PREDNISONE 5 MG PO TABS: 5.0000 mg | ORAL_TABLET | Freq: Every day | ORAL | 3 refills | Status: DC

## 2017-01-12 MED ORDER — PREDNISONE 2.5 MG PO TABS: 2.5000 mg | ORAL_TABLET | Freq: Every day | ORAL | 1 refills | Status: DC

## 2017-01-12 NOTE — Patient Instructions (Signed)
   We will reduce the prednisone dose to 7.5 mg daily for 2 months, then reduce to 5 mg daily.  Call if the symptoms of Myasthenia worsen.

## 2017-01-12 NOTE — Progress Notes (Signed)
Reason for visit: Myasthenia gravis, seizures  Kyle [name]. is an 82 y.o. male  History of present illness:  Kyle [name] is an 82 year old right-handed white male with a history of myasthenia gravis primarily with ocular features. The patient has been on Mestinon and he was placed on low-dose prednisone taking 10 mg daily in June 2018 secondary to ongoing symptoms of double vision. Since being on prednisone his myasthenic symptoms have significantly improved. He has not had any problems chewing, swallowing, he reports no ptosis or double vision. Unfortunately, he was in the hospital in September 2018 with a perforated diverticula. He has required a JP drain and antibiotics. He is now covering from this. The patient has not had any exacerbation of his myasthenia or any further seizures in spite of all of the medical issues he has encountered recently. The patient returns to the office today for an evaluation. The atenolol was discontinued, the patient was bradycardic on the medication.  Past Medical History:  Diagnosis Date  . Arthritis   . Auto immune neutropenia (HCC)   . Bullous pemphigoid   . Cancer [facility])    Prostate  . Cervical spondylosis without myelopathy 01/22/2016  . DVT (deep venous thrombosis) ([place])    a. Has had a previous right lower extremity DVT in the setting of hospitalization and surgery (after his brain tumor was resected in 1993) but is no longer on Coumadin.  Marland Kitchen Dyslipidemia   . History of prostate cancer   . Hypertension   . Meningioma ([place])    a. s/p resection 1990s.  . Myasthenia ([place]) 02/14/2013  . Nocturnal leg cramps   . Ocular myasthenia gravis ([place])   . Seizures ([place])   . Sinus bradycardia   . Sleep paralysis   . Thyroid nodule, cold     Past Surgical History:  Procedure Laterality Date  . BRAIN SURGERY    . CHOLECYSTECTOMY    . COLONOSCOPY N/A 07/25/2013   Procedure: COLONOSCOPY;  Surgeon: Jamesetta So, MD;  [facility];   Service: Gastroenterology;  Laterality: N/A;  . COLONOSCOPY N/A 11/10/2016   Procedure: COLONOSCOPY;  Surgeon: Aviva Signs, MD;  Location[facility];  Service: Gastroenterology;  Laterality: N/A;  . IR CATHETER TUBE CHANGE  01/02/2017  . RADIOACTIVE SEED IMPLANT      Family History  Problem Relation Age of Onset  . Heart failure Mother   . Diabetes Mother   . Diabetes Sister   . Lung cancer Son   . Heart failure Daughter   . Colon cancer Neg Hx     Social history:  reports that he has never smoked. He has never used smokeless tobacco. He reports that he does not drink alcohol or use drugs.    Allergies  Allergen Reactions  . Iodinated Diagnostic Agents Hives     pt was premedicated/hives reaction occurred 24 hours post injection, Onset Date: [id number]   . Sulfa Antibiotics Swelling and Rash  . Bactericin [Bacitracin] Other (See Comments)    Unknown reaction     Medications:  Prior to Admission medications   Medication Sig Start Date End Date Taking? Authorizing Provider  clobetasol (TEMOVATE) 0.05 % external solution Apply 1 application topically daily as needed (rash).  11/17/12  Yes [provider]  diclofenac (VOLTAREN) 75 MG EC tablet TAKE 1 TABLET BY MOUTH TWO  TIMES DAILY 10/19/16  Yes Kathrynn Ducking, MD  fish oil-omega-3 fatty acids 1000 MG capsule Take 1 g  by mouth 3 (three) times daily.   Yes [provider]  gabapentin (NEURONTIN) 400 MG capsule Take 1 capsule (400 mg total) by mouth at bedtime. 05/18/16  Yes Kathrynn Ducking, MD  Garlic (GARLIQUE PO) Take 1 tablet by mouth daily.   Yes [provider]  levETIRAcetam (KEPPRA) 500 MG tablet TAKE 1/2 TABLET BY MOUTH IN THE MORNING AND 1 TABLET IN THE EVENING 01/06/17  Yes Kathrynn Ducking, MD  losartan (COZAAR) 25 MG tablet Take 25 mg by mouth at bedtime.  06/02/16  Yes [provider]  magnesium oxide (MAGOX 400) 400 (241.3 Mg) MG tablet Take 800 mg by mouth daily.   Yes [provider]  niacinamide 500 MG tablet Take 500 mg by mouth 3 (three) times daily.   Yes [provider]  Polyethyl Glycol-Propyl Glycol (SYSTANE) 0.4-0.3 % SOLN Place 1 drop into both eyes 3 (three) times daily as needed (for dry eyes).   Yes [provider]  predniSONE (DELTASONE) 5 MG tablet Take 2 tablets (10 mg total) by mouth daily with breakfast. 09/24/16  Yes Kathrynn Ducking, MD  pyridostigmine (MESTINON) 60 MG tablet Take 1.5 tablets (90 mg total) by mouth 3 (three) times daily. 09/14/16  Yes Kathrynn Ducking, MD  simvastatin (ZOCOR) 20 MG tablet Take 20 mg by mouth at bedtime.  06/02/16  Yes [provider]    ROS:  Out of a complete 14 system review of symptoms, the patient complains only of the following symptoms, and all other reviewed systems are negative.  Skin rash Leg swelling  Blood pressure (!) 141/69, pulse 65, height 5\' 8"  (1.727 m), weight 173 lb 8 oz (78.7 kg).  Physical Exam  General: The patient is alert and cooperative at the time of the examination.  Skin: 1-2+ edema below the knees is noted, skin rashes noted below the knees.   Neurologic Exam  Mental status: The patient is alert and oriented x 3 at the time of the examination. The patient has apparent normal recent and remote memory, with an apparently normal attention span and concentration ability.   Cranial nerves: Facial symmetry is present. Speech is normal, no aphasia or dysarthria is noted. Extraocular movements are full. Visual fields are full. With superior gaze for 1 minute, no ptosis or divergence of gaze or subjective double vision was noted.  Motor: The patient has good strength in all 4 extremities. With arms outstretched 1 minute, no fatigable weakness of the deltoid muscles was noted.  Sensory examination: Soft touch sensation is symmetric on the face, arms, and legs.  Coordination: The patient has good finger-nose-finger and heel-to-shin bilaterally.  Gait  and station: The patient has a normal gait. Tandem gait is unsteady. Romberg is negative. No drift is seen.  Reflexes: Deep tendon reflexes are symmetric.   Assessment/Plan:  1. Myasthenia gravis  2. History of seizures  The patient is doing relatively well at this point. We will reduce the prednisone taking 7.5 mg daily for 2 months and then go to 5 mg daily. He will call if his myasthenic symptoms worsen. The patient will remain on Mestinon and Keppra for the seizures. Will follow-up in 5 months.  Jill Alexanders MD 01/12/2017 9:02 AM  [facility] [address] [place], [place] [address]  Phone [phone] Fax [phone]

## 2017-01-20 ENCOUNTER — Ambulatory Visit
Admission: RE | Admit: 2017-01-20 | Discharge: 2017-01-20 | Disposition: A | Discharge status: Home / Self Care | Payer: Medicare Other | Source: Ambulatory Visit | Attending: General Surgery | Admitting: General Surgery

## 2017-01-20 ENCOUNTER — Other Ambulatory Visit: Payer: Self-pay | Admitting: General Surgery

## 2017-01-20 DIAGNOSIS — K5792 Diverticulitis of intestine, part unspecified, without perforation or abscess without bleeding: Secondary | ICD-10-CM

## 2017-01-20 DIAGNOSIS — K651 Peritoneal abscess: Secondary | ICD-10-CM | POA: Diagnosis not present

## 2017-01-20 DIAGNOSIS — Z434 Encounter for attention to other artificial openings of digestive tract: Secondary | ICD-10-CM | POA: Diagnosis not present

## 2017-01-20 HISTORY — PX: IR RADIOLOGIST EVAL & MGMT: IMG5224

## 2017-01-20 NOTE — Progress Notes (Signed)
Chief Complaint: Patient was seen in consultation today for follow up diverticular abscess drain at the request of Jenkins,Mark  Referring Physician(s): Aviva Signs  History of Present Illness: Kyle [name]. is a 82 y.o. male with diverticular abscess s/p drain placement 9/19. Was seen on 9/26 for follow up and had drain injection as well. Abscess was resolved by CT but injection showed + fistulous communication to the colon. He was switched to gravity bag and advised not to flush. He has been doing well the past 2 weeks. Reports minimal drainage. Bowels moving well. He does report he still developed a pretty bad rash after his contrasted VT and injection last visit despite pre-medication. This has finally resolved and he is a bit nervous about getting contrast injection today. I reassurred him that drain injection is not the same as systemic intravenous injection and his risk of reaction would be very low. He agreed to proceed with injection today  Past Medical History:  Diagnosis Date  . Arthritis   . Auto immune neutropenia (HCC)   . Bullous pemphigoid   . Cancer [facility])    Prostate  . Cervical spondylosis without myelopathy 01/22/2016  . DVT (deep venous thrombosis) ([place])    a. Has had a previous right lower extremity DVT in the setting of hospitalization and surgery (after his brain tumor was resected in 1993) but is no longer on Coumadin.  Marland Kitchen Dyslipidemia   . History of prostate cancer   . Hypertension   . Meningioma ([place])    a. s/p resection 1990s.  . Myasthenia ([place]) 02/14/2013  . Nocturnal leg cramps   . Ocular myasthenia gravis ([place])   . Seizures ([place])   . Sinus bradycardia   . Sleep paralysis   . Thyroid nodule, cold     Past Surgical History:  Procedure Laterality Date  . BRAIN SURGERY    . CHOLECYSTECTOMY    . COLONOSCOPY N/A 07/25/2013   Procedure: COLONOSCOPY;  Surgeon: Jamesetta So, MD;  [facility];  Service: Gastroenterology;   Laterality: N/A;  . COLONOSCOPY N/A 11/10/2016   Procedure: COLONOSCOPY;  Surgeon: Aviva Signs, MD;  Location: [facility];  Service: Gastroenterology;  Laterality: N/A;  . IR CATHETER TUBE CHANGE  01/02/2017  . RADIOACTIVE SEED IMPLANT      Allergies: Iodinated diagnostic agents; Sulfa antibiotics; and Bactericin [bacitracin]  Medications: Prior to Admission medications   Medication Sig Start Date End Date Taking? Authorizing Provider  clobetasol (TEMOVATE) 0.05 % external solution Apply 1 application topically daily as needed (rash).  11/17/12   [provider]  diclofenac (VOLTAREN) 75 MG EC tablet TAKE 1 TABLET BY MOUTH TWO  TIMES DAILY 10/19/16   Kathrynn Ducking, MD  fish oil-omega-3 fatty acids 1000 MG capsule Take 1 g by mouth 3 (three) times daily.    [provider]  gabapentin (NEURONTIN) 400 MG capsule Take 1 capsule (400 mg total) by mouth at bedtime. 05/18/16   Kathrynn Ducking, MD  Garlic (GARLIQUE PO) Take 1 tablet by mouth daily.    [provider]  levETIRAcetam (KEPPRA) 500 MG tablet TAKE 1/2 TABLET BY MOUTH IN THE MORNING AND 1 TABLET IN THE EVENING 01/06/17   Kathrynn Ducking, MD  losartan (COZAAR) 25 MG tablet Take 25 mg by mouth at bedtime.  06/02/16   [provider]  magnesium oxide (MAGOX 400) 400 (241.3 Mg) MG tablet Take 800 mg by mouth daily.    [provider]  niacinamide 500 MG tablet Take 500 mg by mouth 3 (three) times daily.    [provider]  Polyethyl Glycol-Propyl Glycol (SYSTANE) 0.4-0.3 % SOLN Place 1 drop into both eyes 3 (three) times daily as needed (for dry eyes).    [provider]  predniSONE (DELTASONE) 2.5 MG tablet Take 1 tablet (2.5 mg total) by mouth daily with breakfast. 01/12/17   Kathrynn Ducking, MD  predniSONE (DELTASONE) 5 MG tablet Take 1 tablet (5 mg total) by mouth daily with breakfast. 01/12/17   Kathrynn Ducking, MD  pyridostigmine (MESTINON) 60 MG tablet Take 1.5  tablets (90 mg total) by mouth 3 (three) times daily. 09/14/16   Kathrynn Ducking, MD  simvastatin (ZOCOR) 20 MG tablet Take 20 mg by mouth at bedtime.  06/02/16   [provider]     Family History  Problem Relation Age of Onset  . Heart failure Mother   . Diabetes Mother   . Diabetes Sister   . Lung cancer Son   . Heart failure Daughter   . Colon cancer Neg Hx     Social History   Social History  . Marital status: Married    Spouse name: N/A  . Number of children: 1  . Years of education: 24 [place]   Occupational History  . retired    Social History Main Topics  . Smoking status: Never Smoker  . Smokeless tobacco: Never Used  . Alcohol use No  . Drug use: No  . Sexual activity: Not on file   Other Topics Concern  . Not on file   Social History Narrative   Patient is right handed.   Patient drinks 1 cup of coffee daily.      Review of Systems: A 12 point ROS discussed and pertinent positives are indicated in the HPI above.  All other systems are negative.  Review of Systems  Vital Signs: There were no vitals taken for this visit.  Physical Exam LLQ drain intact, sie clean, NT Injection performed, see full report but fistula still patent.  Imaging: Ct Abdomen Pelvis Wo Contrast  Result Date: 12/27/2016 CLINICAL DATA:  Diverticulitis with perforation. Persistent fever and abdominal pain. EXAM: CT ABDOMEN AND PELVIS WITHOUT CONTRAST TECHNIQUE: Multidetector CT imaging of the abdomen and pelvis was performed following the standard protocol without IV contrast. COMPARISON:  12/18/2016 FINDINGS: Lower chest: Normal Hepatobiliary: No liver lesion. Previous cholecystectomy. Benign hepatic calcifications unchanged. Pancreas: Normal Spleen: Normal Adrenals/Urinary Tract: Adrenal glands are normal. Kidneys show some cortical atrophy. Multiple small parapelvic cysts. Nonobstructing 2 mm stone in the right lower pole. Nonobstructing 4 mm stone in the left midportion. No  evidence of passing stone. Bladder appears normal. Stomach/Bowel: No evidence of bowel obstruction. Advanced sigmoid diverticulitis with perforation. 3.8 cm abscess in the central pelvis. 5 cm region of less distinct inflammatory change with edema and gas. No evidence of cul de sac abscess. Vascular/Lymphatic: Aortic atherosclerosis. No aneurysm. IVC is normal. No retroperitoneal adenopathy. Reproductive: Multiple prostate seed implants. Other: No free fluid or air. Musculoskeletal: Chronic lumbar degenerative changes. IMPRESSION: Persistent sigmoid diverticulitis with perforation. 4cm abscess immediately adjacent. 5 cm region of phlegmonous inflammation with air bubbles and edema. Electronically Signed   By: Nelson Chimes M.D.   On: 12/27/2016 11:20   Ct Abdomen Pelvis W Contrast  Result Date: 01/06/2017 CLINICAL DATA:  Sigmoid colonic diverticulitis with abscess status post percutaneous drainage abscess on 12/29/2016. EXAM: CT ABDOMEN AND PELVIS WITH CONTRAST TECHNIQUE: Multidetector CT  imaging of the abdomen and pelvis was performed using the standard protocol following bolus administration of intravenous contrast. CONTRAST:  40mL ISOVUE-300 IOPAMIDOL (ISOVUE-300) INJECTION 61% The patient was pre-medicated with a full 13 hour steroid prep prior to contrast administration. COMPARISON:  12/27/2016 FINDINGS: Lower chest: No acute abnormality. Hepatobiliary: No focal liver abnormality is seen. Status post cholecystectomy. No biliary dilatation. Pancreas: Unremarkable. No pancreatic ductal dilatation or surrounding inflammatory changes. Spleen: Normal in size without focal abnormality. Adrenals/Urinary Tract: Adrenal glands are unremarkable. Stable small bilateral nonobstructing renal calculi and bilateral parapelvic renal cysts. Bladder is unremarkable. Stomach/Bowel: No evidence of bowel obstruction. Markedly reduced inflammation at the level of sigmoid diverticulitis. Drainage catheter just superior to the  sigmoid colon present with complete decompression of abscess and no residual fluid remaining. No extraluminal air is identified. No evidence of bowel obstruction or ileus. Vascular/Lymphatic: No significant vascular findings are present. No enlarged abdominal or pelvic lymph nodes. Reproductive: Prostate is unremarkable. Other: No abdominal wall hernia or abnormality. No abdominopelvic ascites. Musculoskeletal: Stable lumbar degenerative disc disease with associated mild leftward convex scoliosis. IMPRESSION: Resolved sigmoid diverticular abscess with no residual liquefied abscess remaining at the level of the percutaneous drainage catheter. Inflammation related to acute diverticulitis shows significant reduction since the prior CT. Electronically Signed   By: Aletta Edouard M.D.   On: 01/06/2017 16:59   Ir Catheter Tube Change  Result Date: 01/02/2017 INDICATION: Status post percutaneous catheter drainage of diverticular abscess on 12/29/2016. The drainage catheter has become damaged with separation of the catheter from its external hub outside of the body and now requires replacement. EXAM: PERCUTANEOUS DRAINAGE CATHETER EXCHANGE FLUOROSCOPY TIME:  30 seconds.  2.4 mGy. CONTRAST:  5 mL Isovue-300 MEDICATIONS: None ANESTHESIA/SEDATION: None COMPLICATIONS: None immediate. PROCEDURE: Informed written consent was obtained from the patient after a thorough discussion of the procedural risks, benefits and alternatives. All questions were addressed. Maximal Sterile Barrier Technique was utilized including caps, mask, sterile gowns, sterile gloves, sterile drape, hand hygiene and skin antiseptic. A timeout was performed prior to the initiation of the procedure. The pre-existing drainage catheter was prepped. Injection was performed of a small amount of contrast material via the drainage catheter tubing in order to confirm catheter positioning. The catheter was cut and removed over a guidewire. A new 10 French  percutaneous drainage catheter was advanced over the wire. Catheter positioning was confirmed with fluoroscopy. The catheter was flushed and connected to a suction bulb. The catheter was secured at the skin with a Prolene retention suture. A sterile overlying dressing was applied. FINDINGS: Distal catheter positioning appears stable with the level of a decompressed diverticular abscess. A new drainage catheter was positioned within the abscess cavity. IMPRESSION: Exchange of diverticular abscess drainage catheter due to damage to the originally placed drain. A new 10 French drain was placed and advanced into the abscess cavity. Electronically Signed   By: Aletta Edouard M.D.   On: 01/02/2017 11:10   Dg Sinus/fist Tube Chk-non Gi  Result Date: 01/06/2017 INDICATION: Status post percutaneous drainage of sigmoid diverticular abscess on 12/29/2016. Abscess cavity has resolved by CT and the drain is now injected to assess for potential remaining fistula to the sigmoid colon. EXAM: INJECTION OF PERCUTANEOUS ABSCESS DRAINAGE CATHETER UNDER FLUOROSCOPY CONTRAST:  10 mL Omnipaque 300 FLUOROSCOPY TIME:  48 seconds.  90 mGy. MEDICATIONS: None ANESTHESIA/SEDATION: None COMPLICATIONS: None immediate. PROCEDURE: The pre-existing drainage catheter was injected with contrast material and multiple fluoroscopic spot images obtained including cine sequences. The drainage  catheter was then aspirated, gently flushed with saline and attached to a gravity bag. FINDINGS: With injection of the drainage catheter, there is initial filling of an irregular cavity around the pigtail portion of the drainage catheter. There then is filling of a thin fistula inferior to the abscess cavity that communicates with the lumen of the sigmoid colon with contrast than demonstrating further transit in the sigmoid colon, opacifying several sigmoid diverticula. IMPRESSION: Injection of the diverticular abscess drainage catheter demonstrates a fistula to  the sigmoid colon. The fistula is inferior to the abscess cavity and is very thin by fluoroscopic imaging. Attempt will be made to try to close the fistula by further management of the drainage catheter. The catheter was attached to a gravity drainage bag rather than a suction bulb and the patient and his wife were told to stop flushing the drain. A repeat drainage catheter injection will be performed in 2 weeks. Electronically Signed   By: Aletta Edouard M.D.   On: 01/06/2017 17:13   Ct Image Guided Drainage By Percutaneous Catheter  Result Date: 12/30/2016 INDICATION: 82 year old with a diverticular abscess. Plan for CT-guided drain placement. EXAM: CT GUIDED DRAINAGE OF DIVERTICULAR ABSCESS MEDICATIONS: Sedation medications ANESTHESIA/SEDATION: 3.0 mg IV Versed 100 mcg IV Fentanyl Moderate Sedation Time:  26 minutes The patient was continuously monitored during the procedure by the interventional radiology nurse under my direct supervision. COMPLICATIONS: None immediate. TECHNIQUE: Informed written consent was obtained from the patient after a thorough discussion of the procedural risks, benefits and alternatives. All questions were addressed. A timeout was performed prior to the initiation of the procedure. PROCEDURE: CT images obtained in the lower abdomen and pelvis with the patient supine. The left side of the abdomen was prepped and draped in sterile fashion. Skin was anesthetized with 1% lidocaine. Using CT guidance, an 18 gauge trocar needle was directed from the lateral left abdomen into the midline abscess collection. Yellow purulent fluid was aspirated from the collection. Stiff Amplatz wire was placed. Tract was dilated to accommodate a 10 [place] multipurpose drain. 30 mL of yellow purulent fluid was removed. Catheter was attached to a suction bulb. FINDINGS: Air-fluid collection in the midline of the lower abdomen. Multiple adjacent small pockets of extraluminal gas just caudal and posterior to  the larger air-fluid collection. Drain was successfully placed within the largest air-fluid collection. 30 mL of purulent fluid was removed. The largest air-fluid collection was decompressed at the end of the procedure. IMPRESSION: CT-guided placement of a drainage catheter within the lower abdominal diverticular abscess. Electronically Signed   By: Markus Daft M.D.   On: 12/30/2016 08:44    Labs:  CBC:  Recent Labs  12/27/16 1049 12/28/16 0628 12/29/16 0633 12/30/16 0602  WBC 19.2* 14.8* 13.4* 9.0  HGB 12.6* 13.4 13.5 13.3  HCT 38.0* 41.0 40.9 40.3  PLT 269 271 284 257    COAGS:  Recent Labs  12/28/16 0628  INR 1.36  APTT 29    BMP:  Recent Labs  12/19/16 0639 12/27/16 1049 12/28/16 0628 12/29/16 0633  NA 139 136 136 135  K 3.4* 4.0 4.1 3.9  CL 103 102 102 102  CO2 25 24 25 23   GLUCOSE 82 206* 95 99  BUN 17 18 15 14   CALCIUM 9.1 8.9 8.6* 8.5*  CREATININE 0.72 0.87 0.78 0.84  GFRNONAA >60 >60 >60 >60  GFRAA >60 >60 >60 >60    LIVER FUNCTION TESTS:  Recent Labs  12/15/16 1031 12/17/16 0648 12/19/16 4034  12/27/16 1049  BILITOT 1.7* 1.1 0.7 0.4  AST 40 22 23 29   ALT 64* 37 36 27  ALKPHOS 68 68 69 51  PROT 7.1 7.3 7.5 6.6  ALBUMIN 3.5 3.2* 3.3* 2.9*    TUMOR MARKERS: No results for input(s): AFPTM, CEA, CA199, CHROMGRNA in the last 8760 hours.  Assessment and Plan: Diverticular abscess s/p perc drain Drain injection today shows fistula remains patent. Continue gravity drainage, no flushes. Recheck again in 2 weeks. Encouraged pt to take po Benadryl as a precaution when he gets home and to call if reaction occurs again.  Thank you for this interesting consult.  I greatly enjoyed meeting [employer]. and look forward to participating in their care.  A copy of this report was sent to the requesting provider on this date.  Electronically Signed: Ascencion Dike 01/20/2017, 11:41 AM   I spent a total of 20 minutes in face to face in  clinical consultation, greater than 50% of which was counseling/coordinating care for diverticular abscess drain

## 2017-01-21 ENCOUNTER — Ambulatory Visit: Payer: Medicare Other | Admitting: Nurse Practitioner

## 2017-01-25 ENCOUNTER — Encounter: Payer: Self-pay | Admitting: Radiology

## 2017-02-03 ENCOUNTER — Other Ambulatory Visit: Payer: Self-pay | Admitting: General Surgery

## 2017-02-03 ENCOUNTER — Ambulatory Visit
Admission: RE | Admit: 2017-02-03 | Discharge: 2017-02-03 | Disposition: A | Discharge status: Home / Self Care | Payer: Medicare Other | Source: Ambulatory Visit | Attending: General Surgery | Admitting: General Surgery

## 2017-02-03 ENCOUNTER — Telehealth: Payer: Self-pay | Admitting: Neurology

## 2017-02-03 DIAGNOSIS — Z434 Encounter for attention to other artificial openings of digestive tract: Secondary | ICD-10-CM | POA: Diagnosis not present

## 2017-02-03 DIAGNOSIS — K5792 Diverticulitis of intestine, part unspecified, without perforation or abscess without bleeding: Secondary | ICD-10-CM

## 2017-02-03 DIAGNOSIS — K5780 Diverticulitis of intestine, part unspecified, with perforation and abscess without bleeding: Secondary | ICD-10-CM | POA: Diagnosis not present

## 2017-02-03 HISTORY — PX: IR RADIOLOGIST EVAL & MGMT: IMG5224

## 2017-02-03 MED ORDER — PREDNISONE 5 MG PO TABS: 5.0000 mg | ORAL_TABLET | Freq: Every day | ORAL | 5 refills | Status: DC

## 2017-02-03 NOTE — Addendum Note (Signed)
Addended by: Kathrynn Ducking on: 02/03/2017 05:34 PM   Modules accepted: Orders

## 2017-02-03 NOTE — Telephone Encounter (Signed)
The prescription for prednisone will be called in. 

## 2017-02-03 NOTE — Consult Note (Signed)
Chief Complaint: Patient was seen in consultation today for follow-up of the diverticular abscess at the request of Jenkins,Mark  Referring Physician(s): Aviva Signs  History of Present Illness: Kyle Sutch. is a 82 y.o. male with a diverticular abscess. Percutaneous drain placement on 12/29/2016. Follow-up imaging has demonstrated resolution of the diverticular abscess but the patient has a persistent colonic fistula. The last fistula study was 2 weeks ago. Patient notes minimal drainage from the catheter. Approximately half a teaspoon per day. He denies fevers, chills or abdominal pain. He is having normal bowel movements. He has completed his antibiotics. He is not flushing the drain. History of an iodinated contrast reaction but no reaction with the previous drain injection.  Past Medical History:  Diagnosis Date  . Arthritis   . Auto immune neutropenia (HCC)   . Bullous pemphigoid   . Cancer [facility])    Prostate  . Cervical spondylosis without myelopathy 01/22/2016  . DVT (deep venous thrombosis) ([place])    a. Has had a previous right lower extremity DVT in the setting of hospitalization and surgery (after his brain tumor was resected in 1993) but is no longer on Coumadin.  Marland Kitchen Dyslipidemia   . History of prostate cancer   . Hypertension   . Meningioma ([place])    a. s/p resection 1990s.  . Myasthenia ([place]) 02/14/2013  . Nocturnal leg cramps   . Ocular myasthenia gravis ([place])   . Seizures ([place])   . Sinus bradycardia   . Sleep paralysis   . Thyroid nodule, cold     Past Surgical History:  Procedure Laterality Date  . BRAIN SURGERY    . CHOLECYSTECTOMY    . COLONOSCOPY N/A 07/25/2013   Procedure: COLONOSCOPY;  Surgeon: Jamesetta So, MD;  [facility];  Service: Gastroenterology;  Laterality: N/A;  . COLONOSCOPY N/A 11/10/2016   Procedure: COLONOSCOPY;  Surgeon: Aviva Signs, MD;  Location: [facility];  Service: Gastroenterology;  Laterality: N/A;  . IR  CATHETER TUBE CHANGE  01/02/2017  . IR RADIOLOGIST EVAL & MGMT  01/06/2017  . RADIOACTIVE SEED IMPLANT      Allergies: Iodinated diagnostic agents; Sulfa antibiotics; and Bactericin [bacitracin]  Medications: Prior to Admission medications   Medication Sig Start Date End Date Taking? Authorizing Provider  clobetasol (TEMOVATE) 0.05 % external solution Apply 1 application topically daily as needed (rash).  11/17/12   [provider]  diclofenac (VOLTAREN) 75 MG EC tablet TAKE 1 TABLET BY MOUTH TWO  TIMES DAILY 10/19/16   Kathrynn Ducking, MD  fish oil-omega-3 fatty acids 1000 MG capsule Take 1 g by mouth 3 (three) times daily.    [provider]  gabapentin (NEURONTIN) 400 MG capsule Take 1 capsule (400 mg total) by mouth at bedtime. 05/18/16   Kathrynn Ducking, MD  Garlic (GARLIQUE PO) Take 1 tablet by mouth daily.    [provider]  levETIRAcetam (KEPPRA) 500 MG tablet TAKE 1/2 TABLET BY MOUTH IN THE MORNING AND 1 TABLET IN THE EVENING 01/06/17   Kathrynn Ducking, MD  losartan (COZAAR) 25 MG tablet Take 25 mg by mouth at bedtime.  06/02/16   [provider]  magnesium oxide (MAGOX 400) 400 (241.3 Mg) MG tablet Take 800 mg by mouth daily.    [provider]  niacinamide 500 MG tablet Take 500 mg by mouth 3 (three) times daily.    [provider]  Polyethyl Glycol-Propyl Glycol (SYSTANE) 0.4-0.3 % SOLN Place 1 drop  into both eyes 3 (three) times daily as needed (for dry eyes).    [provider]  predniSONE (DELTASONE) 2.5 MG tablet Take 1 tablet (2.5 mg total) by mouth daily with breakfast. 01/12/17   Kathrynn Ducking, MD  predniSONE (DELTASONE) 5 MG tablet Take 1 tablet (5 mg total) by mouth daily with breakfast. 01/12/17   Kathrynn Ducking, MD  pyridostigmine (MESTINON) 60 MG tablet Take 1.5 tablets (90 mg total) by mouth 3 (three) times daily. 09/14/16   Kathrynn Ducking, MD  simvastatin (ZOCOR) 20 MG tablet Take 20 mg by mouth at  bedtime.  06/02/16   [provider]     Family History  Problem Relation Age of Onset  . Heart failure Mother   . Diabetes Mother   . Diabetes Sister   . Lung cancer Son   . Heart failure Daughter   . Colon cancer Neg Hx     Social History   Social History  . Marital status: Married    Spouse name: N/A  . Number of children: 1  . Years of education: 51 [place]   Occupational History  . retired    Social History Main Topics  . Smoking status: Never Smoker  . Smokeless tobacco: Never Used  . Alcohol use No  . Drug use: No  . Sexual activity: Not on file   Other Topics Concern  . Not on file   Social History Narrative   Patient is right handed.   Patient drinks 1 cup of coffee daily.    Review of Systems  Constitutional: Negative for activity change, chills and fever.  Gastrointestinal: Negative for abdominal distention and abdominal pain.    Vital Signs: BP 133/82   Pulse 60   Temp 97.8 F (36.6 C)   SpO2 100%   Physical Exam  Constitutional: No distress.  Abdominal: Soft. He exhibits no distension. There is no tenderness.  Percutaneous drain is present in the left lower abdomen. Retention sutures are still intact.  The StatLock device was removed and a new [name] was placed. Few drops of cloudy yellow fluid were within the gravity bag.       Imaging: Ct Abdomen Pelvis W Contrast  Result Date: 01/06/2017 CLINICAL DATA:  Sigmoid colonic diverticulitis with abscess status post percutaneous drainage abscess on 12/29/2016. EXAM: CT ABDOMEN AND PELVIS WITH CONTRAST TECHNIQUE: Multidetector CT imaging of the abdomen and pelvis was performed using the standard protocol following bolus administration of intravenous contrast. CONTRAST:  50mL ISOVUE-300 IOPAMIDOL (ISOVUE-300) INJECTION 61% The patient was pre-medicated with a full 13 hour steroid prep prior to contrast administration. COMPARISON:  12/27/2016 FINDINGS: Lower chest: No acute abnormality.  Hepatobiliary: No focal liver abnormality is seen. Status post cholecystectomy. No biliary dilatation. Pancreas: Unremarkable. No pancreatic ductal dilatation or surrounding inflammatory changes. Spleen: Normal in size without focal abnormality. Adrenals/Urinary Tract: Adrenal glands are unremarkable. Stable small bilateral nonobstructing renal calculi and bilateral parapelvic renal cysts. Bladder is unremarkable. Stomach/Bowel: No evidence of bowel obstruction. Markedly reduced inflammation at the level of sigmoid diverticulitis. Drainage catheter just superior to the sigmoid colon present with complete decompression of abscess and no residual fluid remaining. No extraluminal air is identified. No evidence of bowel obstruction or ileus. Vascular/Lymphatic: No significant vascular findings are present. No enlarged abdominal or pelvic lymph nodes. Reproductive: Prostate is unremarkable. Other: No abdominal wall hernia or abnormality. No abdominopelvic ascites. Musculoskeletal: Stable lumbar degenerative disc disease with associated mild leftward convex scoliosis. IMPRESSION: Resolved sigmoid diverticular  abscess with no residual liquefied abscess remaining at the level of the percutaneous drainage catheter. Inflammation related to acute diverticulitis shows significant reduction since the prior CT. Electronically Signed   By: Aletta Edouard M.D.   On: 01/06/2017 16:59   Dg Sinus/fist Tube Chk-non Gi  Result Date: 02/03/2017 INDICATION: Follow-up diverticular abscess and colonic fistula. Percutaneous drain was placed on 12/29/2016. EXAM: Drain injection with fluoroscopy MEDICATIONS: None ANESTHESIA/SEDATION: None COMPLICATIONS: None immediate. PROCEDURE: Patient has a history of a contrast reaction but did not have any problems with a drain injection 2 weeks ago. Patient was placed supine. The drain was injected with 5 mL of Omnipaque 300. Small amount of contrast was aspirated at the end of the procedure. The  drain was flushed with normal saline and connected back to the gravity bag. The StatLock device was changed.  A new dressing was placed. FINDINGS: Left lower abdominal percutaneous drain is intact. Tube in a stable position based on fluoroscopy. Injection of contrast demonstrates immediate filling of the sigmoid colon from a fistula tract. There is no significant abscess collection around the drain. Fistula connection is similar to the previous examination. IMPRESSION: Persistent fistula to the sigmoid colon. No change from the exam on 01/20/2017. Electronically Signed   By: Markus Daft M.D.   On: 02/03/2017 11:42   Dg Sinus/fist Tube Chk-non Gi  Result Date: 01/20/2017 INDICATION: 82 year old male with a history of diverticular abscess complicated by persistent fistulous communication between the drainage catheter and the sigmoid colon. He presents today for repeat drain injection. Of note, he had a significant cutaneous reaction to intravenous contrast despite a 13 hour pretreatment at his last visit when he had a CT scan of the abdomen and pelvis with contrast. He was not pre treated prior to this drain injection, however this is a very small amount of contrast into the drainage catheter, not the vascular system. His risk of reaction is very low. EXAM: Drain injection under fluoroscopy MEDICATIONS: None ANESTHESIA/SEDATION: None COMPLICATIONS: 5 mL Omnipaque 300 PROCEDURE: Informed written consent was obtained from the patient after a thorough discussion of the procedural risks, benefits and alternatives. All questions were addressed. A timeout was performed prior to the initiation of the procedure. A gentle hand injection of contrast material was performed under real-time fluoroscopy. There is immediate communication along a small fistulous tract between the drainage catheter and the adjacent sigmoid colon. The size of the fistula may be slightly smaller than seen on the prior study. IMPRESSION: Persistent  but slightly improved fistulous communication with the adjacent sigmoid colon. Return to clinic with repeat drain injection in 2 weeks time. Signed, Criselda Peaches, MD [facility] Radiology Electronically Signed   By: Jacqulynn Cadet M.D.   On: 01/20/2017 12:42   Dg Sinus/fist Tube Chk-non Gi  Result Date: 01/06/2017 INDICATION: Status post percutaneous drainage of sigmoid diverticular abscess on 12/29/2016. Abscess cavity has resolved by CT and the drain is now injected to assess for potential remaining fistula to the sigmoid colon. EXAM: INJECTION OF PERCUTANEOUS ABSCESS DRAINAGE CATHETER UNDER FLUOROSCOPY CONTRAST:  10 mL Omnipaque 300 FLUOROSCOPY TIME:  48 seconds.  90 mGy. MEDICATIONS: None ANESTHESIA/SEDATION: None COMPLICATIONS: None immediate. PROCEDURE: The pre-existing drainage catheter was injected with contrast material and multiple fluoroscopic spot images obtained including cine sequences. The drainage catheter was then aspirated, gently flushed with saline and attached to a gravity bag. FINDINGS: With injection of the drainage catheter, there is initial filling of an irregular  cavity around the pigtail portion of the drainage catheter. There then is filling of a thin fistula inferior to the abscess cavity that communicates with the lumen of the sigmoid colon with contrast than demonstrating further transit in the sigmoid colon, opacifying several sigmoid diverticula. IMPRESSION: Injection of the diverticular abscess drainage catheter demonstrates a fistula to the sigmoid colon. The fistula is inferior to the abscess cavity and is very thin by fluoroscopic imaging. Attempt will be made to try to close the fistula by further management of the drainage catheter. The catheter was attached to a gravity drainage bag rather than a suction bulb and the patient and his wife were told to stop flushing the drain. A repeat drainage catheter injection  will be performed in 2 weeks. Electronically Signed   By: Aletta Edouard M.D.   On: 01/06/2017 17:13   Ir Radiologist Eval & Mgmt  Result Date: 01/25/2017 Please refer to notes tab for details about interventional procedure. (Op Note)   Labs:  CBC:  Recent Labs  12/27/16 1049 12/28/16 0628 12/29/16 0633 12/30/16 0602  WBC 19.2* 14.8* 13.4* 9.0  HGB 12.6* 13.4 13.5 13.3  HCT 38.0* 41.0 40.9 40.3  PLT 269 271 284 257    COAGS:  Recent Labs  12/28/16 0628  INR 1.36  APTT 29    BMP:  Recent Labs  12/19/16 0639 12/27/16 1049 12/28/16 0628 12/29/16 0633  NA 139 136 136 135  K 3.4* 4.0 4.1 3.9  CL 103 102 102 102  CO2 25 24 25 23   GLUCOSE 82 206* 95 99  BUN 17 18 15 14   CALCIUM 9.1 8.9 8.6* 8.5*  CREATININE 0.72 0.87 0.78 0.84  GFRNONAA >60 >60 >60 >60  GFRAA >60 >60 >60 >60    LIVER FUNCTION TESTS:  Recent Labs  12/15/16 1031 12/17/16 0648 12/19/16 0639 12/27/16 1049  BILITOT 1.7* 1.1 0.7 0.4  AST 40 22 23 29   ALT 64* 37 36 27  ALKPHOS 68 68 69 51  PROT 7.1 7.3 7.5 6.6  ALBUMIN 3.5 3.2* 3.3* 2.9*    TUMOR MARKERS: No results for input(s): AFPTM, CEA, CA199, CHROMGRNA in the last 8760 hours.  Assessment and Plan:  82 year old with a history of a diverticular abscess and persistent colonic fistula. Percutaneous drain is still intact with minimal output. No change in the colonic fistula from 01/20/2017. Explained to patient that it can take a long time for these fistulas to heal and sometimes they will not heal with drain placement and antibiotics alone.  Patient will keep the drain to a gravity bag without flushing. Plan for another drain injection in 2 weeks.   Thank you for this interesting consult.  I greatly enjoyed meeting Kyle [name]. and look forward to participating in their care.  A copy of this report was sent to the requesting provider on this date.  Electronically Signed: Carylon Perches 02/03/2017, 12:16 PM   I spent a  total of    15 Minutes in face to face in clinical consultation, greater than 50% of which was counseling/coordinating care for a diverticular abscess and fistula.

## 2017-02-03 NOTE — Telephone Encounter (Signed)
Patient requesting a new Rx for predniSONE (DELTASONE) 5 MG tablet #30. He takes 1 pill a day. Please call to [facility] in [place].

## 2017-02-10 ENCOUNTER — Encounter: Payer: Self-pay | Admitting: Radiology

## 2017-02-17 ENCOUNTER — Ambulatory Visit
Admission: RE | Admit: 2017-02-17 | Discharge: 2017-02-17 | Disposition: A | Discharge status: Home / Self Care | Payer: Medicare Other | Source: Ambulatory Visit | Attending: General Surgery | Admitting: General Surgery

## 2017-02-17 DIAGNOSIS — K5780 Diverticulitis of intestine, part unspecified, with perforation and abscess without bleeding: Secondary | ICD-10-CM | POA: Diagnosis not present

## 2017-02-17 DIAGNOSIS — Z434 Encounter for attention to other artificial openings of digestive tract: Secondary | ICD-10-CM | POA: Diagnosis not present

## 2017-02-17 DIAGNOSIS — K5792 Diverticulitis of intestine, part unspecified, without perforation or abscess without bleeding: Secondary | ICD-10-CM

## 2017-02-17 HISTORY — PX: IR RADIOLOGIST EVAL & MGMT: IMG5224

## 2017-02-17 NOTE — Progress Notes (Signed)
Chief Complaint: Patient was seen in consultation today for follow up diverticular abscess drain at the request of Jenkins,Mark  Referring Physician(s): Aviva Signs  History of Present Illness: Kyle [name]. is a 82 y.o. male with diverticular abscess s/p drain placement 9/19. Was seen on 9/26 for follow up and had drain injection as well. Abscess was resolved by CT but injection showed + fistulous communication to the colon. He was switched to gravity bag and advised not to flush. He has had multiple subsequent drain injections confirming persistent fistula patency. He has been doing well the past 2 weeks. Reports minimal drainage. Bowels moving well. Has not had recent follow up with surgical team.  Past Medical History:  Diagnosis Date  . Arthritis   . Auto immune neutropenia (HCC)   . Bullous pemphigoid   . Cancer [facility])    Prostate  . Cervical spondylosis without myelopathy 01/22/2016  . DVT (deep venous thrombosis) ([place])    a. Has had a previous right lower extremity DVT in the setting of hospitalization and surgery (after his brain tumor was resected in 1993) but is no longer on Coumadin.  Marland Kitchen Dyslipidemia   . History of prostate cancer   . Hypertension   . Meningioma ([place])    a. s/p resection 1990s.  . Myasthenia ([place]) 02/14/2013  . Nocturnal leg cramps   . Ocular myasthenia gravis ([place])   . Seizures ([place])   . Sinus bradycardia   . Sleep paralysis   . Thyroid nodule, cold     Past Surgical History:  Procedure Laterality Date  . BRAIN SURGERY    . CHOLECYSTECTOMY    . IR CATHETER TUBE CHANGE  01/02/2017  . IR RADIOLOGIST EVAL & MGMT  01/06/2017  . IR RADIOLOGIST EVAL & MGMT  01/20/2017  . IR RADIOLOGIST EVAL & MGMT  02/03/2017  . RADIOACTIVE SEED IMPLANT      Allergies: Iodinated diagnostic agents; Sulfa antibiotics; and Bactericin [bacitracin]  Medications: Prior to Admission medications   Medication Sig Start Date End Date Taking? Authorizing  Provider  clobetasol (TEMOVATE) 0.05 % external solution Apply 1 application topically daily as needed (rash).  11/17/12   [provider]  diclofenac (VOLTAREN) 75 MG EC tablet TAKE 1 TABLET BY MOUTH TWO  TIMES DAILY 10/19/16   Kathrynn Ducking, MD  fish oil-omega-3 fatty acids 1000 MG capsule Take 1 g by mouth 3 (three) times daily.    [provider]  gabapentin (NEURONTIN) 400 MG capsule Take 1 capsule (400 mg total) by mouth at bedtime. 05/18/16   Kathrynn Ducking, MD  Garlic (GARLIQUE PO) Take 1 tablet by mouth daily.    [provider]  levETIRAcetam (KEPPRA) 500 MG tablet TAKE 1/2 TABLET BY MOUTH IN THE MORNING AND 1 TABLET IN THE EVENING 01/06/17   Kathrynn Ducking, MD  losartan (COZAAR) 25 MG tablet Take 25 mg by mouth at bedtime.  06/02/16   [provider]  magnesium oxide (MAGOX 400) 400 (241.3 Mg) MG tablet Take 800 mg by mouth daily.    [provider]  niacinamide 500 MG tablet Take 500 mg by mouth 3 (three) times daily.    [provider]  Polyethyl Glycol-Propyl Glycol (SYSTANE) 0.4-0.3 % SOLN Place 1 drop into both eyes 3 (three) times daily as needed (for dry eyes).    [provider]  predniSONE (DELTASONE) 2.5 MG tablet Take 1 tablet (2.5 mg total) by mouth daily with breakfast. 01/12/17  Kathrynn Ducking, MD  predniSONE (DELTASONE) 5 MG tablet Take 1 tablet (5 mg total) by mouth daily with breakfast. 01/12/17   Kathrynn Ducking, MD  pyridostigmine (MESTINON) 60 MG tablet Take 1.5 tablets (90 mg total) by mouth 3 (three) times daily. 09/14/16   Kathrynn Ducking, MD  simvastatin (ZOCOR) 20 MG tablet Take 20 mg by mouth at bedtime.  06/02/16   [provider]     Family History  Problem Relation Age of Onset  . Heart failure Mother   . Diabetes Mother   . Diabetes Sister   . Lung cancer Son   . Heart failure Daughter   . Colon cancer Neg Hx     Social History   Socioeconomic History  . Marital status:  Married    Spouse name: Not on file  . Number of children: 1  . Years of education: [place]  . Highest education level: Not on file  Social Needs  . Financial resource strain: Not on file  . Food insecurity - worry: Not on file  . Food insecurity - inability: Not on file  . Transportation needs - medical: Not on file  . Transportation needs - non-medical: Not on file  Occupational History  . Occupation: retired  Tobacco Use  . Smoking status: Never Smoker  . Smokeless tobacco: Never Used  Substance and Sexual Activity  . Alcohol use: No  . Drug use: No  . Sexual activity: Not on file  Other Topics Concern  . Not on file  Social History Narrative   Patient is right handed.   Patient drinks 1 cup of coffee daily.      Review of Systems: A 12 point ROS discussed and pertinent positives are indicated in the HPI above.  All other systems are negative.  Review of Systems  Vital Signs: BP 125/76   Pulse (!) 54   Temp 97.8 F (36.6 C)   SpO2 99%   Physical Exam LLQ drain intact, sie clean, NT Injection performed, see full report but fistula still patent.  Imaging: Dg Sinus/fist Tube Chk-non Gi  Result Date: 02/03/2017 INDICATION: Follow-up diverticular abscess and colonic fistula. Percutaneous drain was placed on 12/29/2016. EXAM: Drain injection with fluoroscopy MEDICATIONS: None ANESTHESIA/SEDATION: None COMPLICATIONS: None immediate. PROCEDURE: Patient has a history of a contrast reaction but did not have any problems with a drain injection 2 weeks ago. Patient was placed supine. The drain was injected with 5 mL of Omnipaque 300. Small amount of contrast was aspirated at the end of the procedure. The drain was flushed with normal saline and connected back to the gravity bag. The StatLock device was changed.  A new dressing was placed. FINDINGS: Left lower abdominal percutaneous drain is intact. Tube in a stable position based on fluoroscopy. Injection of contrast demonstrates  immediate filling of the sigmoid colon from a fistula tract. There is no significant abscess collection around the drain. Fistula connection is similar to the previous examination. IMPRESSION: Persistent fistula to the sigmoid colon. No change from the exam on 01/20/2017. Electronically Signed   By: Markus Daft M.D.   On: 02/03/2017 11:42   Dg Sinus/fist Tube Chk-non Gi  Result Date: 01/20/2017 INDICATION: 82 year old male with a history of diverticular abscess complicated by persistent fistulous communication between the drainage catheter and the sigmoid colon. He presents today for repeat drain injection. Of note, he had a significant cutaneous reaction to intravenous contrast despite a 13 hour pretreatment at his last visit  when he had a CT scan of the abdomen and pelvis with contrast. He was not pre treated prior to this drain injection, however this is a very small amount of contrast into the drainage catheter, not the vascular system. His risk of reaction is very low. EXAM: Drain injection under fluoroscopy MEDICATIONS: None ANESTHESIA/SEDATION: None COMPLICATIONS: 5 mL Omnipaque 300 PROCEDURE: Informed written consent was obtained from the patient after a thorough discussion of the procedural risks, benefits and alternatives. All questions were addressed. A timeout was performed prior to the initiation of the procedure. A gentle hand injection of contrast material was performed under real-time fluoroscopy. There is immediate communication along a small fistulous tract between the drainage catheter and the adjacent sigmoid colon. The size of the fistula may be slightly smaller than seen on the prior study. IMPRESSION: Persistent but slightly improved fistulous communication with the adjacent sigmoid colon. Return to clinic with repeat drain injection in 2 weeks time. Signed, Criselda Peaches, MD [facility] Radiology Electronically Signed   By: Jacqulynn Cadet M.D.   On: 01/20/2017 12:42   Ir Radiologist Eval & Mgmt  Result Date: 02/10/2017 Please refer to notes tab for details about interventional procedure. (Op Note)  Ir Radiologist Eval & Mgmt  Result Date: 02/10/2017 Please refer to notes tab for details about interventional procedure. (Op Note)   Labs:  CBC: Recent Labs    12/27/16 1049 12/28/16 0628 12/29/16 0633 12/30/16 0602  WBC 19.2* 14.8* 13.4* 9.0  HGB 12.6* 13.4 13.5 13.3  HCT 38.0* 41.0 40.9 40.3  PLT 269 271 284 257    COAGS: Recent Labs    12/28/16 0628  INR 1.36  APTT 29    BMP: Recent Labs    12/19/16 0639 12/27/16 1049 12/28/16 0628 12/29/16 0633  NA 139 136 136 135  K 3.4* 4.0 4.1 3.9  CL 103 102 102 102  CO2 25 24 25 23   GLUCOSE 82 206* 95 99  BUN 17 18 15 14   CALCIUM 9.1 8.9 8.6* 8.5*  CREATININE 0.72 0.87 0.78 0.84  GFRNONAA >60 >60 >60 >60  GFRAA >60 >60 >60 >60    LIVER FUNCTION TESTS: Recent Labs    12/15/16 1031 12/17/16 0648 12/19/16 0639 12/27/16 1049  BILITOT 1.7* 1.1 0.7 0.4  AST 40 22 23 29   ALT 64* 37 36 27  ALKPHOS 68 68 69 51  PROT 7.1 7.3 7.5 6.6  ALBUMIN 3.5 3.2* 3.3* 2.9*    TUMOR MARKERS: No results for input(s): AFPTM, CEA, CA199, CHROMGRNA in the last 8760 hours.  Assessment and Plan: Diverticular abscess s/p perc drain Drain injection today again shows fistula remains patent. Continue gravity drainage, no flushes. Encouraged patient to follow up with his surgeon for further plan/management as it has been almost 2 months since drain placement. Otherwise follow up drain injection in another 2 weeks if no plans for surgery.  Thank you for this interesting consult.  I greatly enjoyed meeting [employer]. and look forward to participating in their care.  A copy of this report was sent to the requesting provider on this date.  Electronically Signed: Ascencion Dike 02/17/2017, 11:28 AM   I spent a total of 20 minutes in face to face in  clinical consultation, greater than 50% of which was counseling/coordinating care for diverticular abscess drain

## 2017-02-25 ENCOUNTER — Ambulatory Visit (INDEPENDENT_AMBULATORY_CARE_PROVIDER_SITE_OTHER): Payer: Medicare Other | Admitting: General Surgery

## 2017-02-25 ENCOUNTER — Encounter: Payer: Self-pay | Admitting: General Surgery

## 2017-02-25 VITALS — BP 156/72 | HR 57 | Temp 97.5°F | Resp 18 | Ht 68.0 in | Wt 170.0 lb

## 2017-02-25 DIAGNOSIS — K5732 Diverticulitis of large intestine without perforation or abscess without bleeding: Secondary | ICD-10-CM | POA: Diagnosis not present

## 2017-02-25 NOTE — Progress Notes (Signed)
Subjective:     Kyle [name].  Here for follow-up of diverticulitis of the sigmoid colon.  He has had a catheter in place for almost 2 months.  Recent CT scan with fistulogram showed a small but persistent hole in the sigmoid colon.  He was referred back to my care for possible partial colectomy.  Patient states that he feels fine.  He has had minimal, less than 10 cc cloudy drainage in the bag.  No stool or air have been in the bag.  He does not flush it.  He denies any fever or chills.  He is not taking antibiotics at this time. Objective:    BP (!) 156/72   Pulse (!) 57   Temp (!) 97.5 F (36.4 C)   Resp 18   Ht 5\' 8"  (1.727 m)   Wt 170 lb (77.1 kg)   BMI 25.85 kg/m   General:  alert, cooperative and no distress  Abdomen is soft, nontender, nondistended.  Drainage tube in place in left lower quadrant with some cloudy drainage in the tubing, but nothing in the bag.  No air is in the bag.  No stool was in the bag. CT scan results reviewed.     Assessment:    Perforated sigmoid diverticulitis with resolving abscess.  Clinically, he does not have evidence of persistent leak from the sigmoid colon.    Plan:   Will discuss with interventional radiology.  He is at increased risk for surgery due to his age and hypertension, but it is not prohibitive.  Given that clinically he has improved, would like to see him one more CT scan with may be downsizing of the drain.  Patient understands and agrees.  Follow-up after CT scan is done.

## 2017-03-01 ENCOUNTER — Other Ambulatory Visit (HOSPITAL_COMMUNITY): Payer: Self-pay | Admitting: Interventional Radiology

## 2017-03-01 DIAGNOSIS — Z8719 Personal history of other diseases of the digestive system: Secondary | ICD-10-CM

## 2017-03-02 ENCOUNTER — Other Ambulatory Visit: Payer: Self-pay | Admitting: Radiology

## 2017-03-03 ENCOUNTER — Ambulatory Visit (HOSPITAL_COMMUNITY)
Admission: RE | Admit: 2017-03-03 | Discharge: 2017-03-03 | Disposition: A | Discharge status: Home / Self Care | Payer: Medicare Other | Source: Ambulatory Visit | Attending: Interventional Radiology | Admitting: Interventional Radiology

## 2017-03-03 ENCOUNTER — Encounter (HOSPITAL_COMMUNITY): Payer: Self-pay

## 2017-03-03 ENCOUNTER — Other Ambulatory Visit (HOSPITAL_COMMUNITY): Payer: Self-pay | Admitting: Interventional Radiology

## 2017-03-03 DIAGNOSIS — Z86718 Personal history of other venous thrombosis and embolism: Secondary | ICD-10-CM | POA: Diagnosis not present

## 2017-03-03 DIAGNOSIS — I10 Essential (primary) hypertension: Secondary | ICD-10-CM | POA: Diagnosis not present

## 2017-03-03 DIAGNOSIS — Z09 Encounter for follow-up examination after completed treatment for conditions other than malignant neoplasm: Secondary | ICD-10-CM | POA: Diagnosis not present

## 2017-03-03 DIAGNOSIS — Z8719 Personal history of other diseases of the digestive system: Secondary | ICD-10-CM

## 2017-03-03 DIAGNOSIS — K5730 Diverticulosis of large intestine without perforation or abscess without bleeding: Secondary | ICD-10-CM | POA: Diagnosis not present

## 2017-03-03 DIAGNOSIS — R569 Unspecified convulsions: Secondary | ICD-10-CM | POA: Insufficient documentation

## 2017-03-03 DIAGNOSIS — K630 Abscess of intestine: Secondary | ICD-10-CM | POA: Diagnosis not present

## 2017-03-03 DIAGNOSIS — E785 Hyperlipidemia, unspecified: Secondary | ICD-10-CM | POA: Insufficient documentation

## 2017-03-03 DIAGNOSIS — Z7952 Long term (current) use of systemic steroids: Secondary | ICD-10-CM | POA: Diagnosis not present

## 2017-03-03 DIAGNOSIS — Z4803 Encounter for change or removal of drains: Secondary | ICD-10-CM | POA: Diagnosis not present

## 2017-03-03 DIAGNOSIS — K651 Peritoneal abscess: Secondary | ICD-10-CM | POA: Diagnosis not present

## 2017-03-03 DIAGNOSIS — G7000 Myasthenia gravis without (acute) exacerbation: Secondary | ICD-10-CM | POA: Insufficient documentation

## 2017-03-03 HISTORY — PX: IR SINUS/FIST TUBE CHK-NON GI: IMG673

## 2017-03-03 LAB — CBC WITH DIFFERENTIAL/PLATELET
Basophils Absolute: 0 10*3/uL (ref 0.0–0.1)
Basophils Relative: 0 %
EOS ABS: 0.1 10*3/uL (ref 0.0–0.7)
EOS PCT: 1 %
HCT: 40.4 % (ref 39.0–52.0)
Hemoglobin: 13.6 g/dL (ref 13.0–17.0)
LYMPHS ABS: 1.9 10*3/uL (ref 0.7–4.0)
LYMPHS PCT: 21 %
MCH: 31.3 pg (ref 26.0–34.0)
MCHC: 33.7 g/dL (ref 30.0–36.0)
MCV: 93.1 fL (ref 78.0–100.0)
MONO ABS: 0.8 10*3/uL (ref 0.1–1.0)
Monocytes Relative: 9 %
Neutro Abs: 6.4 10*3/uL (ref 1.7–7.7)
Neutrophils Relative %: 69 %
PLATELETS: 192 10*3/uL (ref 150–400)
RBC: 4.34 MIL/uL (ref 4.22–5.81)
RDW: 13.5 % (ref 11.5–15.5)
WBC: 9.2 10*3/uL (ref 4.0–10.5)

## 2017-03-03 LAB — COMPREHENSIVE METABOLIC PANEL
ALT: 19 U/L (ref 17–63)
AST: 22 U/L (ref 15–41)
Albumin: 4.1 g/dL (ref 3.5–5.0)
Alkaline Phosphatase: 59 U/L (ref 38–126)
Anion gap: 8 (ref 5–15)
BUN: 20 mg/dL (ref 6–20)
CALCIUM: 9.6 mg/dL (ref 8.9–10.3)
CO2: 27 mmol/L (ref 22–32)
Chloride: 106 mmol/L (ref 101–111)
Creatinine, Ser: 0.68 mg/dL (ref 0.61–1.24)
GFR calc Af Amer: >60 mL/min (ref >60)
Glucose, Bld: 89 mg/dL (ref 65–99)
Potassium: 3.9 mmol/L (ref 3.5–5.1)
SODIUM: 141 mmol/L (ref 135–145)
TOTAL PROTEIN: 7.8 g/dL (ref 6.5–8.1)
Total Bilirubin: 0.9 mg/dL (ref 0.3–1.2)

## 2017-03-03 LAB — PROTIME-INR
INR: 1.02
PROTHROMBIN TIME: 13.3 s (ref 11.4–15.2)

## 2017-03-03 MED ORDER — IOPAMIDOL (ISOVUE-300) INJECTION 61%
10.0000 mL | Freq: Once | INTRAVENOUS | Status: AC | PRN
  Administered 2017-03-03: 10 mL

## 2017-03-03 MED ORDER — LIDOCAINE-EPINEPHRINE (PF) 2 %-1:200000 IJ SOLN
INTRAMUSCULAR | Status: AC | PRN
  Administered 2017-03-03: 10 mL

## 2017-03-03 MED ORDER — SODIUM CHLORIDE 0.9 % IV SOLN
INTRAVENOUS | Status: DC
  Administered 2017-03-03: 11:00:00 via INTRAVENOUS

## 2017-03-03 MED ORDER — IOPAMIDOL (ISOVUE-300) INJECTION 61%
INTRAVENOUS | Status: AC
  Filled 2017-03-03: qty 50

## 2017-03-03 MED ORDER — LIDOCAINE-EPINEPHRINE (PF) 2 %-1:200000 IJ SOLN
INTRAMUSCULAR | Status: AC
  Filled 2017-03-03: qty 20

## 2017-03-03 NOTE — H&P (Signed)
Referring Physician(s): Mickeal Needy  Supervising Physician: Sandi Mariscal  Patient Status:  [facility]  Chief Complaint: pelvic abscess  Subjective: Patient familiar to IR service from prior diverticular abscess drain placement on 12/29/16.  The drain was subsequently exchanged on 01/02/17.  He has a known persistent fistula from the drained abscess cavity to the sigmoid colon.  He has an increased risk for surgery due to his age and hypertension but it is not prohibitive.  Given that patient has clinically improved, surgical team prefers that he undergo drain downsizing to hopefully expedite fistula closing at this time and he presents today for the procedure.  He currently denies fever, chills, headache, chest pain, dyspnea, abdominal pain, nausea vomiting or bleeding.  Does have occasional cough.  He is currently not irrigating drain and there has been minimal output.  Past Medical History:  Diagnosis Date  . Arthritis   . Auto immune neutropenia (HCC)   . Bullous pemphigoid   . Cancer [facility])    Prostate  . Cervical spondylosis without myelopathy 01/22/2016  . DVT (deep venous thrombosis) ([place])    a. Has had a previous right lower extremity DVT in the setting of hospitalization and surgery (after his brain tumor was resected in 1993) but is no longer on Coumadin.  Marland Kitchen Dyslipidemia   . History of prostate cancer   . Hypertension   . Meningioma ([place])    a. s/p resection 1990s.  . Myasthenia ([place]) 02/14/2013  . Nocturnal leg cramps   . Ocular myasthenia gravis ([place])   . Seizures ([place])   . Sinus bradycardia   . Sleep paralysis   . Thyroid nodule, cold    Past Surgical History:  Procedure Laterality Date  . BRAIN SURGERY    . CHOLECYSTECTOMY    . COLONOSCOPY N/A 07/25/2013   Procedure: COLONOSCOPY;  Surgeon: Jamesetta So, MD;  [facility];  Service: Gastroenterology;  Laterality: N/A;  . COLONOSCOPY N/A 11/10/2016   Procedure: COLONOSCOPY;  Surgeon: Aviva Signs, MD;   Location: [facility];  Service: Gastroenterology;  Laterality: N/A;  . IR CATHETER TUBE CHANGE  01/02/2017  . IR RADIOLOGIST EVAL & MGMT  01/06/2017  . IR RADIOLOGIST EVAL & MGMT  01/20/2017  . IR RADIOLOGIST EVAL & MGMT  02/03/2017  . IR RADIOLOGIST EVAL & MGMT  02/17/2017  . RADIOACTIVE SEED IMPLANT       Allergies: Iodinated diagnostic agents; Sulfa antibiotics; and Bactericin [bacitracin]  Medications: Prior to Admission medications   Medication Sig Start Date End Date Taking? Authorizing Provider  clobetasol (TEMOVATE) 0.05 % external solution Apply 1 application topically daily as needed (rash).  11/17/12  Yes [provider]  diclofenac (VOLTAREN) 75 MG EC tablet TAKE 1 TABLET BY MOUTH TWO  TIMES DAILY 10/19/16  Yes Kathrynn Ducking, MD  fish oil-omega-3 fatty acids 1000 MG capsule Take 1 g by mouth 3 (three) times daily.   Yes [provider]  gabapentin (NEURONTIN) 400 MG capsule Take 1 capsule (400 mg total) by mouth at bedtime. 05/18/16  Yes Kathrynn Ducking, MD  Garlic (GARLIQUE PO) Take 1 tablet by mouth daily.   Yes [provider]  levETIRAcetam (KEPPRA) 500 MG tablet TAKE 1/2 TABLET BY MOUTH IN THE MORNING AND 1 TABLET IN THE EVENING 01/06/17  Yes Kathrynn Ducking, MD  losartan (COZAAR) 25 MG tablet Take 25 mg by mouth at bedtime.  06/02/16  Yes [provider]  magnesium oxide (MAGOX 400) 400 (241.3  Mg) MG tablet Take 800 mg by mouth daily.   Yes [provider]  niacinamide 500 MG tablet Take 500 mg by mouth 3 (three) times daily.   Yes [provider]  Polyethyl Glycol-Propyl Glycol (SYSTANE) 0.4-0.3 % SOLN Place 1 drop into both eyes 3 (three) times daily as needed (for dry eyes).   Yes [provider]  predniSONE (DELTASONE) 2.5 MG tablet Take 1 tablet (2.5 mg total) by mouth daily with breakfast. 01/12/17  Yes Kathrynn Ducking, MD  predniSONE (DELTASONE) 5 MG tablet Take 1 tablet (5 mg total) by mouth daily  with breakfast. 02/03/17  Yes Kathrynn Ducking, MD  pyridostigmine (MESTINON) 60 MG tablet Take 1.5 tablets (90 mg total) by mouth 3 (three) times daily. 09/14/16  Yes Kathrynn Ducking, MD  simvastatin (ZOCOR) 20 MG tablet Take 20 mg by mouth at bedtime.  06/02/16  Yes [provider]     Vital Signs: BP (!) 141/77   Pulse (!) 50   Temp 98.4 F (36.9 C) (Oral)   Resp 20   Ht 5\' 8"  (1.727 m)   Wt 168 lb 8 oz (76.4 kg)   SpO2 100%   BMI 25.62 kg/m   Physical Exam awake, alert.  Chest clear to auscultation bilaterally.  Heart with bradycardic rhythm with some ectopy; abdomen soft, positive bowel sounds, nontender, clean, intact left lower quadrant drain with minimal output in bag; no lower extremity edema  Imaging: No results found.  Labs:  CBC: Recent Labs    12/28/16 0628 12/29/16 0633 12/30/16 0602 03/03/17 1024  WBC 14.8* 13.4* 9.0 9.2  HGB 13.4 13.5 13.3 13.6  HCT 41.0 40.9 40.3 40.4  PLT 271 284 257 192    COAGS: Recent Labs    12/28/16 0628 03/03/17 1024  INR 1.36 1.02  APTT 29  --     BMP: Recent Labs    12/27/16 1049 12/28/16 0628 12/29/16 0633 03/03/17 1024  NA 136 136 135 141  K 4.0 4.1 3.9 3.9  CL 102 102 102 106  CO2 24 25 23 27   GLUCOSE 206* 95 99 89  BUN 18 15 14 20   CALCIUM 8.9 8.6* 8.5* 9.6  CREATININE 0.87 0.78 0.84 0.68  GFRNONAA >60 >60 >60 >60  GFRAA >60 >60 >60 >60    LIVER FUNCTION TESTS: Recent Labs    12/17/16 0648 12/19/16 0639 12/27/16 1049 03/03/17 1024  BILITOT 1.1 0.7 0.4 0.9  AST 22 23 29 22   ALT 37 36 27 19  ALKPHOS 68 69 51 59  PROT 7.3 7.5 6.6 7.8  ALBUMIN 3.2* 3.3* 2.9* 4.1    Assessment and Plan: Patient with history of diverticular abscess drain placement on 12/29/16 with persistent fistula to sigmoid colon; He has an increased risk for surgery due to his age and hypertension but it is not prohibitive.  Given that patient has clinically improved, surgical team prefers that he undergo drain  downsizing to hopefully expedite fistula closing at this time and he presents today for the procedure.  Details/risks of procedure, including but not limited to, internal bleeding, infection, injury to adjacent structures, need for prolonged drainage discussed with patient and family with their understanding and consent.   Electronically Signed: D. Rowe Robert, PA-C 03/03/2017, 11:19 AM   I spent a total of 20 minutes  at the the patient's bedside AND on the patient's hospital floor or unit, greater than 50% of which was counseling/coordinating care for pelvic abscess drain exchange/downsizing

## 2017-03-03 NOTE — Discharge Instructions (Signed)
Wound Care Wound Care  Keep the wound clean and dry.  Remove dressing in 24 hours and may shower  Contact a doctor if:  You were given a tetanus shot and you have any of these where the needle went in: ? Swelling. ? Very bad pain. ? Redness. ? Bleeding.  You have a fever.  A wound that was closed breaks open.  You notice a bad smell coming from the wound.  You notice something coming out of the wound, such as wood or glass.  Medicine does not help your pain.  You have any of these at the site of the wound. ? More redness. ? More swelling. ? More pain.  You have any of these coming from the wound. ? Fluid. ? Blood. ? Pus.  You notice a change in the color of your skin near the wound.  You need to change the bandage often due to fluid, blood, or pus coming from the wound.  You have a new rash.  You have numbness around the wound. Get help right away if:  You have very bad swelling around the wound.  Your pain suddenly gets worse and is very bad.  You have painful lumps near the wound or on skin that is anywhere on your body.  You have a red streak going away from the wound.  The wound is on your hand or foot and you cannot move a finger or toe like normal.  The wound is on your hand or foot and you notice that your fingers or toes look pale or bluish. This information is not intended to replace advice given to you by your health care provider. Make sure you discuss any questions you have with your health care provider. Document Released: 09/16/2007 Document Revised: 09/05/2015 Document Reviewed: 11/09/2012 Elsevier Interactive Patient Education  2017 [employer].

## 2017-03-03 NOTE — Procedures (Signed)
Pre procedural Dx: Diverticular Abscess Post procedural Dx: Same  Fluoro guided drain injection is negative for the presence of a persistent fistula between the end of the decompressed abscess cavity and the colon.  Drainage catheter removed at the bedside without incident.  EBL: None  Complications: None immediate  Ronny Bacon, MD Pager #: [phone]

## 2017-03-12 ENCOUNTER — Ambulatory Visit: Payer: Medicare Other | Admitting: Neurology

## 2017-04-21 ENCOUNTER — Other Ambulatory Visit: Payer: Self-pay | Admitting: Neurology

## 2017-05-14 ENCOUNTER — Other Ambulatory Visit: Payer: Self-pay | Admitting: "Endocrinology

## 2017-05-14 DIAGNOSIS — E039 Hypothyroidism, unspecified: Secondary | ICD-10-CM

## 2017-06-09 ENCOUNTER — Other Ambulatory Visit: Payer: Self-pay | Admitting: Nurse Practitioner

## 2017-06-16 ENCOUNTER — Encounter: Payer: Self-pay | Admitting: Adult Health

## 2017-06-16 ENCOUNTER — Encounter (INDEPENDENT_AMBULATORY_CARE_PROVIDER_SITE_OTHER): Payer: Self-pay

## 2017-06-16 ENCOUNTER — Ambulatory Visit: Payer: Medicare Other | Admitting: Adult Health

## 2017-06-16 VITALS — BP 138/65 | HR 57 | Wt 169.2 lb

## 2017-06-16 DIAGNOSIS — R569 Unspecified convulsions: Secondary | ICD-10-CM

## 2017-06-16 DIAGNOSIS — G7000 Myasthenia gravis without (acute) exacerbation: Secondary | ICD-10-CM | POA: Diagnosis not present

## 2017-06-16 MED ORDER — PREDNISONE 5 MG PO TABS
2.5000 mg | ORAL_TABLET | Freq: Every day | ORAL | 5 refills | Status: DC
Start: 2017-06-16 — End: 2018-05-12

## 2017-06-16 NOTE — Progress Notes (Signed)
I have read the note, and I agree with the clinical assessment and plan.  Bedie Dominey K [place]   

## 2017-06-16 NOTE — Patient Instructions (Signed)
Your Plan:  Continue Mestinon and Keppra Reduce Prednisone to 2.5 mg daily. If symptoms reoccur please let us know After 2 months on prednisone please call and we can further reduce dose.  If your symptoms worsen or you develop new symptoms please let us know.   Thank you for coming to see [place] at [facility]. I hope we have been able to provide you high quality care today.  You may receive a patient satisfaction survey over the next few weeks. We would appreciate your feedback and comments so that we may continue to improve ourselves and the health of our patients.

## 2017-06-16 NOTE — Progress Notes (Signed)
PATIENT: Kyle [name]. DOB: 04/06/1935  REASON FOR VISIT: follow up HISTORY FROM: patient  HISTORY OF PRESENT ILLNESS: Today 06/16/17 Kyle [name] is an 83 year old male with a history of myasthenia gravis primarily with ocular features.  He returns today for follow-up.  He remains on Mestinon 90 mg 3 times a day.  Reports that he is tolerating this medication well.  He did reduce his dose of prednisone to 5 mg daily.  He denies diplopia and ptosis.  Denies any trouble chewing or swallowing.  Denies any weakness in the arms.  He does report that he has some ongoing weakness in the legs.  He states that if he squats down he has to use his arms but push him back up.  He states before he can do this with no assistance.  The patient denies any seizure events.  He continues on Keppra and tolerates it well.  He returns today for evaluation.  HISTORY Kyle [name] is an 83 year old right-handed white male with a history of myasthenia gravis primarily with ocular features. The patient has been on Mestinon and he was placed on low-dose prednisone taking 10 mg daily in June 2018 secondary to ongoing symptoms of double vision. Since being on prednisone his myasthenic symptoms have significantly improved. He has not had any problems chewing, swallowing, he reports no ptosis or double vision. Unfortunately, he was in the hospital in September 2018 with a perforated diverticula. He has required a JP drain and antibiotics. He is now covering from this. The patient has not had any exacerbation of his myasthenia or any further seizures in spite of all of the medical issues he has encountered recently. The patient returns to the office today for an evaluation. The atenolol was discontinued, the patient was bradycardic on the medication.   REVIEW OF SYSTEMS: Out of a complete 14 system review of symptoms, the patient complains only of the following symptoms, and all other reviewed systems are  negative.  Diarrhea, muscle cramps  ALLERGIES: Allergies  Allergen Reactions  . Iodinated Diagnostic Agents Hives     pt was premedicated/hives reaction occurred 24 hours post injection, Onset Date: [id number]   . Sulfa Antibiotics Swelling and Rash  . Bactericin [Bacitracin] Other (See Comments)    Unknown reaction     HOME MEDICATIONS: Outpatient Medications Prior to Visit  Medication Sig Dispense Refill  . clobetasol (TEMOVATE) 0.05 % external solution Apply 1 application topically daily as needed (rash).     . diclofenac (VOLTAREN) 75 MG EC tablet TAKE 1 TABLET BY MOUTH TWO  TIMES DAILY 180 tablet 1  . fish oil-omega-3 fatty acids 1000 MG capsule Take 1 g by mouth 3 (three) times daily.    Marland Kitchen gabapentin (NEURONTIN) 400 MG capsule TAKE 1 CAPSULE BY MOUTH AT  BEDTIME 90 capsule 3  . Garlic (GARLIQUE PO) Take 1 tablet by mouth daily.    Marland Kitchen levETIRAcetam (KEPPRA) 500 MG tablet TAKE 1/2 TABLET BY MOUTH IN THE MORNING AND 1 TABLET IN THE EVENING 135 tablet 3  . losartan (COZAAR) 25 MG tablet Take 25 mg by mouth at bedtime.     . magnesium oxide (MAGOX 400) 400 (241.3 Mg) MG tablet Take 800 mg by mouth daily.    . niacinamide 500 MG tablet Take 500 mg by mouth 3 (three) times daily.    Vladimir Faster Glycol-Propyl Glycol (SYSTANE) 0.4-0.3 % SOLN Place 1 drop into both eyes 3 (three) times daily as needed (for dry eyes).    Marland Kitchen  predniSONE (DELTASONE) 5 MG tablet Take 1 tablet (5 mg total) by mouth daily with breakfast. 30 tablet 5  . pyridostigmine (MESTINON) 60 MG tablet Take 1.5 tablets (90 mg total) by mouth 3 (three) times daily. 270 tablet 3  . simvastatin (ZOCOR) 20 MG tablet Take 20 mg by mouth at bedtime.     . predniSONE (DELTASONE) 2.5 MG tablet Take 1 tablet (2.5 mg total) by mouth daily with breakfast. 30 tablet 1   No facility-administered medications prior to visit.     PAST MEDICAL HISTORY: Past Medical History:  Diagnosis Date  . Arthritis   . Auto immune neutropenia (HCC)    . Bullous pemphigoid   . Cancer [facility])    Prostate  . Cervical spondylosis without myelopathy 01/22/2016  . DVT (deep venous thrombosis) ([place])    a. Has had a previous right lower extremity DVT in the setting of hospitalization and surgery (after his brain tumor was resected in 1993) but is no longer on Coumadin.  Marland Kitchen Dyslipidemia   . History of prostate cancer   . Hypertension   . Meningioma ([place])    a. s/p resection 1990s.  . Myasthenia ([place]) 02/14/2013  . Nocturnal leg cramps   . Ocular myasthenia gravis ([place])   . Seizures ([place])   . Sinus bradycardia   . Sleep paralysis   . Thyroid nodule, cold     PAST SURGICAL HISTORY: Past Surgical History:  Procedure Laterality Date  . BRAIN SURGERY    . CHOLECYSTECTOMY    . COLONOSCOPY N/A 07/25/2013   Procedure: COLONOSCOPY;  Surgeon: Jamesetta So, MD;  [facility];  Service: Gastroenterology;  Laterality: N/A;  . COLONOSCOPY N/A 11/10/2016   Procedure: COLONOSCOPY;  Surgeon: Aviva Signs, MD;  Location[facility];  Service: Gastroenterology;  Laterality: N/A;  . IR CATHETER TUBE CHANGE  01/02/2017  . IR RADIOLOGIST EVAL & MGMT  01/06/2017  . IR RADIOLOGIST EVAL & MGMT  01/20/2017  . IR RADIOLOGIST EVAL & MGMT  02/03/2017  . IR RADIOLOGIST EVAL & MGMT  02/17/2017  . IR SINUS/FIST TUBE CHK-NON GI  03/03/2017  . RADIOACTIVE SEED IMPLANT      FAMILY HISTORY: Family History  Problem Relation Age of Onset  . Heart failure Mother   . Diabetes Mother   . Diabetes Sister   . Dementia Sister   . Lung cancer Son   . Heart failure Daughter   . Colon cancer Neg Hx     SOCIAL HISTORY: Social History   Socioeconomic History  . Marital status: Married    Spouse name: Not on file  . Number of children: 1  . Years of education: 82 TH  . Highest education level: Not on file  Social Needs  . Financial resource strain: Not on file  . Food insecurity - worry: Not on file  . Food insecurity - inability: Not on file  .  Transportation needs - medical: Not on file  . Transportation needs - non-medical: Not on file  Occupational History  . Occupation: retired  Tobacco Use  . Smoking status: Never Smoker  . Smokeless tobacco: Never Used  Substance and Sexual Activity  . Alcohol use: No  . Drug use: No  . Sexual activity: Not on file  Other Topics Concern  . Not on file  Social History Narrative   Patient is right handed.   Patient drinks 1 cup of coffee daily.      PHYSICAL EXAM  Vitals:  06/16/17 0733  BP: 138/65  Pulse: (!) 57  Weight: 169 lb 3.2 oz (76.7 kg)   Body mass index is 25.73 kg/m.  Generalized: Well developed, in no acute distress   Neurological examination  Mentation: Alert oriented to time, place, history taking. Follows all commands speech and language fluent Cranial nerve II-XII: Pupils were equal round reactive to light. Extraocular movements were full, visual field were full on confrontational test. Facial sensation and strength were normal. Uvula tongue midline. Head turning and shoulder shrug  were normal and symmetric.  With superior gaze held for 1 minute no diplopia and ptosis Motor: The motor testing reveals 5 over 5 strength of all 4 extremities. Good symmetric motor tone is noted throughout.  With arms abducted for 1 minute no weakness noted. Sensory: Sensory testing is intact to soft touch on all 4 extremities. No evidence of extinction is noted.  Coordination: Cerebellar testing reveals good finger-nose-finger and heel-to-shin bilaterally.  Gait and station: Gait is normal.  Reflexes: Deep tendon reflexes are symmetric and normal bilaterally.   DIAGNOSTIC DATA (LABS, IMAGING, TESTING) - I reviewed patient records, labs, notes, testing and imaging myself where available.  Lab Results  Component Value Date   WBC 9.2 03/03/2017   HGB 13.6 03/03/2017   HCT 40.4 03/03/2017   MCV 93.1 03/03/2017   PLT 192 03/03/2017      Component Value Date/Time   NA 141  03/03/2017 1024   K 3.9 03/03/2017 1024   CL 106 03/03/2017 1024   CO2 27 03/03/2017 1024   GLUCOSE 89 03/03/2017 1024   BUN 20 03/03/2017 1024   CREATININE 0.68 03/03/2017 1024   CALCIUM 9.6 03/03/2017 1024   PROT 7.8 03/03/2017 1024   ALBUMIN 4.1 03/03/2017 1024   AST 22 03/03/2017 1024   ALT 19 03/03/2017 1024   ALKPHOS 59 03/03/2017 1024   BILITOT 0.9 03/03/2017 1024   GFRNONAA >60 03/03/2017 1024   GFRAA >60 03/03/2017 1024   Lab Results  Component Value Date   CHOL 182 10/26/2014   HDL 67 10/26/2014   LDLCALC 89 10/26/2014   TRIG 130 10/26/2014   CHOLHDL 2.7 10/26/2014   Lab Results  Component Value Date   HGBA1C 5.9 (H) 10/25/2014   Lab Results  Component Value Date   VITAMINB12 327 10/26/2014   Lab Results  Component Value Date   TSH 1.820 07/21/2016      ASSESSMENT AND PLAN 83 y.o. year old male  has a past medical history of Arthritis, Auto immune neutropenia ([place]), Bullous pemphigoid, Cancer ([place]), Cervical spondylosis without myelopathy (01/22/2016), DVT (deep venous thrombosis) ([place]), Dyslipidemia, History of prostate cancer, Hypertension, Meningioma ([place]), Myasthenia ([place]) (02/14/2013), Nocturnal leg cramps, Ocular myasthenia gravis ([place]), Seizures ([place]), Sinus bradycardia, Sleep paralysis, and Thyroid nodule, cold. here with :  1.  Myasthenia gravis 2.  Seizures  The patient will continue on Mestinon 90 mg 3 times a day.  We will reduce his dose of prednisone to 2.5 mg daily.  If he has a recurrence of symptoms he should let us know.  After 2 months if he is tolerating the reduction in prednisone he should let us know and we will further reduce his dose.  He will continue on Keppra for seizures.  He is advised that if his symptoms worsen or he develops new symptoms he should let us know.  He will follow-up in 6 months or sooner if needed.    Ward Givens, MSN, NP-C 06/16/2017, 7:55 AM [facility]  [place], [address] [place], [place] [address] [phone]

## 2017-06-28 ENCOUNTER — Telehealth: Payer: Self-pay | Admitting: Neurology

## 2017-06-28 DIAGNOSIS — R8290 Unspecified abnormal findings in urine: Secondary | ICD-10-CM | POA: Diagnosis not present

## 2017-06-28 DIAGNOSIS — C61 Malignant neoplasm of prostate: Secondary | ICD-10-CM | POA: Diagnosis not present

## 2017-06-28 DIAGNOSIS — R3915 Urgency of urination: Secondary | ICD-10-CM | POA: Diagnosis not present

## 2017-06-28 NOTE — Telephone Encounter (Signed)
I called the patient and left a message.  No need to get a Keppra level unless he is having problems with seizure control or tolerance of the drug.

## 2017-06-28 NOTE — Telephone Encounter (Signed)
Patient states he is getting a physical with his primary care. He wanted to know if he needed blood work to check his Keppra levels.

## 2017-07-01 DIAGNOSIS — N321 Vesicointestinal fistula: Secondary | ICD-10-CM | POA: Diagnosis not present

## 2017-07-02 DIAGNOSIS — I10 Essential (primary) hypertension: Secondary | ICD-10-CM | POA: Diagnosis not present

## 2017-07-02 DIAGNOSIS — R3989 Other symptoms and signs involving the genitourinary system: Secondary | ICD-10-CM | POA: Diagnosis not present

## 2017-07-02 DIAGNOSIS — Z0001 Encounter for general adult medical examination with abnormal findings: Secondary | ICD-10-CM | POA: Diagnosis not present

## 2017-07-02 DIAGNOSIS — Z1389 Encounter for screening for other disorder: Secondary | ICD-10-CM | POA: Diagnosis not present

## 2017-07-02 DIAGNOSIS — E782 Mixed hyperlipidemia: Secondary | ICD-10-CM | POA: Diagnosis not present

## 2017-07-02 DIAGNOSIS — Z6824 Body mass index (BMI) 24.0-24.9, adult: Secondary | ICD-10-CM | POA: Diagnosis not present

## 2017-07-02 DIAGNOSIS — G7000 Myasthenia gravis without (acute) exacerbation: Secondary | ICD-10-CM | POA: Diagnosis not present

## 2017-07-02 DIAGNOSIS — K651 Peritoneal abscess: Secondary | ICD-10-CM | POA: Diagnosis not present

## 2017-07-02 DIAGNOSIS — C61 Malignant neoplasm of prostate: Secondary | ICD-10-CM | POA: Diagnosis not present

## 2017-07-09 DIAGNOSIS — N200 Calculus of kidney: Secondary | ICD-10-CM | POA: Diagnosis not present

## 2017-07-09 DIAGNOSIS — K5732 Diverticulitis of large intestine without perforation or abscess without bleeding: Secondary | ICD-10-CM | POA: Diagnosis not present

## 2017-07-14 ENCOUNTER — Ambulatory Visit (HOSPITAL_COMMUNITY)
Admission: RE | Admit: 2017-07-14 | Discharge: 2017-07-14 | Disposition: A | Discharge status: Home / Self Care | Payer: Medicare Other | Source: Ambulatory Visit | Attending: "Endocrinology | Admitting: "Endocrinology

## 2017-07-14 DIAGNOSIS — E042 Nontoxic multinodular goiter: Secondary | ICD-10-CM | POA: Insufficient documentation

## 2017-07-14 DIAGNOSIS — E041 Nontoxic single thyroid nodule: Secondary | ICD-10-CM | POA: Diagnosis not present

## 2017-07-27 DIAGNOSIS — N321 Vesicointestinal fistula: Secondary | ICD-10-CM | POA: Diagnosis not present

## 2017-07-30 ENCOUNTER — Other Ambulatory Visit: Payer: Self-pay | Admitting: Neurology

## 2017-07-30 ENCOUNTER — Other Ambulatory Visit: Payer: Self-pay | Admitting: Nurse Practitioner

## 2017-08-02 DIAGNOSIS — E039 Hypothyroidism, unspecified: Secondary | ICD-10-CM | POA: Diagnosis not present

## 2017-08-02 NOTE — Telephone Encounter (Signed)
Spoke to wife and gets longterm meds thru [facility].

## 2017-08-03 LAB — T4, FREE: FREE T4: 1.11 ng/dL (ref 0.82–1.77)

## 2017-08-03 LAB — TSH: TSH: 2.42 u[IU]/mL (ref 0.450–4.500)

## 2017-08-09 ENCOUNTER — Ambulatory Visit: Payer: Medicare Other | Admitting: "Endocrinology

## 2017-08-09 ENCOUNTER — Encounter: Payer: Self-pay | Admitting: "Endocrinology

## 2017-08-09 VITALS — BP 142/77 | HR 54 | Ht 68.0 in | Wt 166.0 lb

## 2017-08-09 DIAGNOSIS — E042 Nontoxic multinodular goiter: Secondary | ICD-10-CM | POA: Diagnosis not present
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
## 2017-08-17 ENCOUNTER — Telehealth: Payer: Self-pay | Admitting: Adult Health

## 2017-08-17 NOTE — Telephone Encounter (Signed)
He can stop medication. If symptoms reoccur he should let us know

## 2017-08-17 NOTE — Telephone Encounter (Signed)
Pt called to advise since decreasing prednisone to 2.5mg  at last OV he has not had any problems. Pt said he was told to call back in a couple of months to let Megan/NP know so a decision could made if it can be stopped. Please call to advise

## 2017-08-17 NOTE — Telephone Encounter (Signed)
Called the pt to make him aware that he can stop the mediation per Ward Givens, NP. Informed the pt to call us if symptoms reoocur. Pt verbalized understanding. Pt had no questions at this time but was encouraged to call back if questions arise.

## 2017-08-19 DIAGNOSIS — E785 Hyperlipidemia, unspecified: Secondary | ICD-10-CM | POA: Diagnosis not present

## 2017-08-19 DIAGNOSIS — N3289 Other specified disorders of bladder: Secondary | ICD-10-CM | POA: Diagnosis not present

## 2017-08-19 DIAGNOSIS — K641 Second degree hemorrhoids: Secondary | ICD-10-CM | POA: Diagnosis not present

## 2017-08-19 DIAGNOSIS — I10 Essential (primary) hypertension: Secondary | ICD-10-CM | POA: Diagnosis not present

## 2017-08-19 DIAGNOSIS — N321 Vesicointestinal fistula: Secondary | ICD-10-CM | POA: Diagnosis not present

## 2017-08-19 DIAGNOSIS — Z79899 Other long term (current) drug therapy: Secondary | ICD-10-CM | POA: Diagnosis not present

## 2017-08-19 DIAGNOSIS — Z91041 Radiographic dye allergy status: Secondary | ICD-10-CM | POA: Diagnosis not present

## 2017-08-19 DIAGNOSIS — G7000 Myasthenia gravis without (acute) exacerbation: Secondary | ICD-10-CM | POA: Diagnosis not present

## 2017-08-19 DIAGNOSIS — K5732 Diverticulitis of large intestine without perforation or abscess without bleeding: Secondary | ICD-10-CM | POA: Diagnosis not present

## 2017-08-20 DIAGNOSIS — I493 Ventricular premature depolarization: Secondary | ICD-10-CM | POA: Diagnosis not present

## 2017-08-20 DIAGNOSIS — R9431 Abnormal electrocardiogram [ECG] [EKG]: Secondary | ICD-10-CM | POA: Diagnosis not present

## 2017-09-09 ENCOUNTER — Ambulatory Visit (HOSPITAL_COMMUNITY)
Admission: RE | Admit: 2017-09-09 | Discharge: 2017-09-09 | Disposition: A | Discharge status: Home / Self Care | Payer: Medicare Other | Source: Ambulatory Visit | Attending: "Endocrinology | Admitting: "Endocrinology

## 2017-09-09 ENCOUNTER — Encounter (HOSPITAL_COMMUNITY): Payer: Self-pay

## 2017-09-09 DIAGNOSIS — D649 Anemia, unspecified: Secondary | ICD-10-CM | POA: Diagnosis not present

## 2017-09-09 DIAGNOSIS — Z0001 Encounter for general adult medical examination with abnormal findings: Secondary | ICD-10-CM | POA: Diagnosis not present

## 2017-09-09 DIAGNOSIS — E042 Nontoxic multinodular goiter: Secondary | ICD-10-CM | POA: Diagnosis not present

## 2017-09-09 DIAGNOSIS — E748 Other specified disorders of carbohydrate metabolism: Secondary | ICD-10-CM | POA: Diagnosis not present

## 2017-09-09 DIAGNOSIS — E041 Nontoxic single thyroid nodule: Secondary | ICD-10-CM | POA: Diagnosis not present

## 2017-09-09 MED ORDER — LIDOCAINE HCL (PF) 2 % IJ SOLN
INTRAMUSCULAR | Status: AC
  Filled 2017-09-09: qty 10

## 2017-09-09 NOTE — Procedures (Signed)
Uncomplicated FNA x 5 of the indeterminate right thyroid nodule using [place].    JWatts MD

## 2017-09-15 DIAGNOSIS — G8918 Other acute postprocedural pain: Secondary | ICD-10-CM | POA: Diagnosis not present

## 2017-09-15 DIAGNOSIS — N3289 Other specified disorders of bladder: Secondary | ICD-10-CM | POA: Diagnosis not present

## 2017-09-15 DIAGNOSIS — Z79899 Other long term (current) drug therapy: Secondary | ICD-10-CM | POA: Diagnosis not present

## 2017-09-15 DIAGNOSIS — K641 Second degree hemorrhoids: Secondary | ICD-10-CM | POA: Diagnosis not present

## 2017-09-15 DIAGNOSIS — E785 Hyperlipidemia, unspecified: Secondary | ICD-10-CM | POA: Diagnosis not present

## 2017-09-15 DIAGNOSIS — I10 Essential (primary) hypertension: Secondary | ICD-10-CM | POA: Diagnosis not present

## 2017-09-15 DIAGNOSIS — K5732 Diverticulitis of large intestine without perforation or abscess without bleeding: Secondary | ICD-10-CM | POA: Diagnosis not present

## 2017-09-15 DIAGNOSIS — G7000 Myasthenia gravis without (acute) exacerbation: Secondary | ICD-10-CM | POA: Diagnosis not present

## 2017-09-15 DIAGNOSIS — Z91041 Radiographic dye allergy status: Secondary | ICD-10-CM | POA: Diagnosis not present

## 2017-09-15 DIAGNOSIS — N321 Vesicointestinal fistula: Secondary | ICD-10-CM | POA: Diagnosis not present

## 2017-09-17 ENCOUNTER — Other Ambulatory Visit: Payer: Self-pay | Admitting: Neurology

## 2017-09-18 DIAGNOSIS — Z8719 Personal history of other diseases of the digestive system: Secondary | ICD-10-CM | POA: Insufficient documentation

## 2017-10-06 DIAGNOSIS — R339 Retention of urine, unspecified: Secondary | ICD-10-CM | POA: Diagnosis not present

## 2017-10-06 DIAGNOSIS — N321 Vesicointestinal fistula: Secondary | ICD-10-CM | POA: Diagnosis not present

## 2017-10-06 DIAGNOSIS — Z466 Encounter for fitting and adjustment of urinary device: Secondary | ICD-10-CM | POA: Diagnosis not present

## 2017-10-07 ENCOUNTER — Other Ambulatory Visit: Payer: Self-pay | Admitting: Neurology

## 2017-10-08 DIAGNOSIS — Z9049 Acquired absence of other specified parts of digestive tract: Secondary | ICD-10-CM | POA: Diagnosis not present

## 2017-10-08 DIAGNOSIS — Z9889 Other specified postprocedural states: Secondary | ICD-10-CM | POA: Diagnosis not present

## 2017-10-08 DIAGNOSIS — Z8719 Personal history of other diseases of the digestive system: Secondary | ICD-10-CM | POA: Diagnosis not present

## 2017-10-08 DIAGNOSIS — Z4800 Encounter for change or removal of nonsurgical wound dressing: Secondary | ICD-10-CM | POA: Diagnosis not present

## 2017-11-09 ENCOUNTER — Ambulatory Visit: Payer: Medicare Other | Admitting: "Endocrinology

## 2017-11-09 ENCOUNTER — Encounter: Payer: Self-pay | Admitting: "Endocrinology

## 2017-11-09 VITALS — BP 118/54 | HR 66 | Ht 68.0 in | Wt 161.0 lb

## 2017-11-09 DIAGNOSIS — E042 Nontoxic multinodular goiter: Secondary | ICD-10-CM | POA: Diagnosis not present
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
## 2017-11-25 ENCOUNTER — Other Ambulatory Visit: Payer: Self-pay | Admitting: Neurology

## 2017-11-25 DIAGNOSIS — X32XXXD Exposure to sunlight, subsequent encounter: Secondary | ICD-10-CM | POA: Diagnosis not present

## 2017-11-25 DIAGNOSIS — Z1283 Encounter for screening for malignant neoplasm of skin: Secondary | ICD-10-CM | POA: Diagnosis not present

## 2017-11-25 DIAGNOSIS — L570 Actinic keratosis: Secondary | ICD-10-CM | POA: Diagnosis not present

## 2017-11-25 DIAGNOSIS — L120 Bullous pemphigoid: Secondary | ICD-10-CM | POA: Diagnosis not present

## 2017-11-25 DIAGNOSIS — L821 Other seborrheic keratosis: Secondary | ICD-10-CM | POA: Diagnosis not present

## 2017-12-07 DIAGNOSIS — R399 Unspecified symptoms and signs involving the genitourinary system: Secondary | ICD-10-CM | POA: Diagnosis not present

## 2017-12-21 ENCOUNTER — Ambulatory Visit: Payer: Medicare Other | Admitting: Adult Health

## 2017-12-21 ENCOUNTER — Encounter: Payer: Self-pay | Admitting: Adult Health

## 2017-12-21 VITALS — BP 150/75 | HR 55 | Ht 68.0 in | Wt 166.2 lb

## 2017-12-21 DIAGNOSIS — G7000 Myasthenia gravis without (acute) exacerbation: Secondary | ICD-10-CM | POA: Diagnosis not present

## 2017-12-21 DIAGNOSIS — R569 Unspecified convulsions: Secondary | ICD-10-CM

## 2017-12-21 DIAGNOSIS — M542 Cervicalgia: Secondary | ICD-10-CM | POA: Diagnosis not present

## 2017-12-21 MED ORDER — DICLOFENAC SODIUM 75 MG PO TBEC: 75.0000 mg | DELAYED_RELEASE_TABLET | Freq: Two times a day (BID) | ORAL | 1 refills | Status: DC

## 2017-12-21 MED ORDER — LEVETIRACETAM 500 MG PO TABS: ORAL_TABLET | ORAL | 3 refills | Status: DC

## 2017-12-21 NOTE — Patient Instructions (Signed)
Your Plan:  Continue Mestinon Continue Keppra for seizures Diclofenac refilled for arthritis If your symptoms worsen or you develop new symptoms please let us know.   Thank you for coming to see [place] at [facility]. I hope we have been able to provide you high quality care today.  You may receive a patient satisfaction survey over the next few weeks. We would appreciate your feedback and comments so that we may continue to improve ourselves and the health of our patients.

## 2017-12-21 NOTE — Progress Notes (Signed)
PATIENT: Kyle [name]. DOB: 08-Aug-1934  REASON FOR VISIT: follow up HISTORY FROM: patient  HISTORY OF PRESENT ILLNESS: Today 12/21/17 Kyle [name] is an 83 year old male with a history of myasthenia gravis with ocular features.  He returns today for follow-up.  He no longer is on prednisone.  He reports that his symptoms did not worsen when he came off of prednisone.  He remains on Mestinon 90 mg 3 times a day.  He reports that he continues to notice ptosis in the right eye.  He denies diplopia.  Denies any trouble chewing or swallowing.  He reports generalized weakness in the legs.  Denies any weakness in the arms.  Denies any trouble breathing.  He continues on Keppra.  Denies any seizure events.  Reports that since the last visit he has had 2 surgeries.  One was for a hole in the colon and another one was for a hole in the bladder.  He returns today for evaluation.  HISTORY 06/16/17 Kyle [name] is an 83 year old male with a history of myasthenia gravis primarily with ocular features.  He returns today for follow-up.  He remains on Mestinon 90 mg 3 times a day.  Reports that he is tolerating this medication well.  He did reduce his dose of prednisone to 5 mg daily.  He denies diplopia and ptosis.  Denies any trouble chewing or swallowing.  Denies any weakness in the arms.  He does report that he has some ongoing weakness in the legs.  He states that if he squats down he has to use his arms but push him back up.  He states before he can do this with no assistance.  The patient denies any seizure events.  He continues on Keppra and tolerates it well.  He reports that he continues to have some neck pain.  He was given diclofenac by Dr. Jannifer [name] for arthritis.  He reports that that has been beneficial. He returns today for evaluation.   REVIEW OF SYSTEMS: Out of a complete 14 system review of symptoms, the patient complains only of the following symptoms, and all other reviewed systems are  negative.  Restless leg, neck pain, neck stiffness  ALLERGIES: Allergies  Allergen Reactions  . Iodinated Diagnostic Agents Hives     pt was premedicated/hives reaction occurred 24 hours post injection, Onset Date: [id number]   . Sulfa Antibiotics Swelling and Rash  . Bactericin [Bacitracin] Other (See Comments)    Unknown reaction     HOME MEDICATIONS: Outpatient Medications Prior to Visit  Medication Sig Dispense Refill  . clobetasol (TEMOVATE) 0.05 % external solution Apply 1 application topically daily as needed (rash).     . diclofenac (VOLTAREN) 75 MG EC tablet TAKE 1 TABLET BY MOUTH TWO  TIMES DAILY 180 tablet 1  . fish oil-omega-3 fatty acids 1000 MG capsule Take 1 g by mouth 3 (three) times daily.    Marland Kitchen gabapentin (NEURONTIN) 400 MG capsule TAKE 1 CAPSULE BY MOUTH AT  BEDTIME 90 capsule 3  . Garlic (GARLIQUE PO) Take 1 tablet by mouth daily.    Marland Kitchen levETIRAcetam (KEPPRA) 500 MG tablet Take by mouth.    . levETIRAcetam (KEPPRA) 500 MG tablet TAKE 1/2 TABLET BY MOUTH IN THE MORNING AND 1 TABLET IN THE EVENING 135 tablet 0  . losartan (COZAAR) 25 MG tablet Take 25 mg by mouth at bedtime.     . magnesium oxide (MAGOX 400) 400 (241.3 Mg) MG tablet Take 800 mg by mouth  daily.    . niacinamide 500 MG tablet Take 500 mg by mouth 3 (three) times daily.    Marland Kitchen oxybutynin (DITROPAN) 5 MG tablet TAKE 1 TABLET BY MOUTH THREE TIMES DAILY FOR 30 DAYS  0  . Polyethyl Glycol-Propyl Glycol (SYSTANE) 0.4-0.3 % SOLN Place 1 drop into both eyes 3 (three) times daily as needed (for dry eyes).    . pyridostigmine (MESTINON) 60 MG tablet Take 1.5 tablets (90 mg total) by mouth 3 (three) times daily. 405 tablet 3  . simvastatin (ZOCOR) 20 MG tablet Take 20 mg by mouth at bedtime.     . predniSONE (DELTASONE) 5 MG tablet Take 0.5 tablets (2.5 mg total) by mouth daily with breakfast. (Patient not taking: Reported on 12/21/2017) 30 tablet 5   No facility-administered medications prior to visit.     PAST  MEDICAL HISTORY: Past Medical History:  Diagnosis Date  . Arthritis   . Auto immune neutropenia (HCC)   . Bullous pemphigoid   . Cancer [facility])    Prostate  . Cervical spondylosis without myelopathy 01/22/2016  . DVT (deep venous thrombosis) ([place])    a. Has had a previous right lower extremity DVT in the setting of hospitalization and surgery (after his brain tumor was resected in 1993) but is no longer on Coumadin.  Marland Kitchen Dyslipidemia   . History of prostate cancer   . Hypertension   . Meningioma ([place])    a. s/p resection 1990s.  . Myasthenia ([place]) 02/14/2013  . Nocturnal leg cramps   . Ocular myasthenia gravis ([place])   . Seizures ([place])   . Sinus bradycardia   . Sleep paralysis   . Thyroid nodule, cold     PAST SURGICAL HISTORY: Past Surgical History:  Procedure Laterality Date  . BRAIN SURGERY    . CHOLECYSTECTOMY    . COLONOSCOPY N/A 07/25/2013   Procedure: COLONOSCOPY;  Surgeon: Kyle So, MD;  [facility];  Service: Gastroenterology;  Laterality: N/A;  . COLONOSCOPY N/A 11/10/2016   Procedure: COLONOSCOPY;  Surgeon: Kyle Signs, MD;  [facility];  Service: Gastroenterology;  Laterality: N/A;  . IR CATHETER TUBE CHANGE  01/02/2017  . IR RADIOLOGIST EVAL & MGMT  01/06/2017  . IR RADIOLOGIST EVAL & MGMT  01/20/2017  . IR RADIOLOGIST EVAL & MGMT  02/03/2017  . IR RADIOLOGIST EVAL & MGMT  02/17/2017  . IR SINUS/FIST TUBE CHK-NON GI  03/03/2017  . RADIOACTIVE SEED IMPLANT      FAMILY HISTORY: Family History  Problem Relation Age of Onset  . Heart failure Mother   . Diabetes Mother   . Diabetes Sister   . Dementia Sister   . Lung cancer Son   . Heart failure Daughter   . Colon cancer Neg Hx     SOCIAL HISTORY: Social History   Socioeconomic History  . Marital status: Married    Spouse name: Not on file  . Number of children: 1  . Years of education: 59 TH  . Highest education level: Not on file  Occupational History  . Occupation:  retired  Scientific laboratory technician  . Financial resource strain: Not on file  . Food insecurity:    Worry: Not on file    Inability: Not on file  . Transportation needs:    Medical: Not on file    Non-medical: Not on file  Tobacco Use  . Smoking status: Never Smoker  . Smokeless tobacco: Never Used  Substance and Sexual Activity  .  Alcohol use: No  . Drug use: No  . Sexual activity: Not on file  Lifestyle  . Physical activity:    Days per week: Not on file    Minutes per session: Not on file  . Stress: Not on file  Relationships  . Social connections:    Talks on phone: Not on file    Gets together: Not on file    Attends religious service: Not on file    Active member of club or organization: Not on file    Attends meetings of clubs or organizations: Not on file    Relationship status: Not on file  . Intimate partner violence:    Fear of current or ex partner: Not on file    Emotionally abused: Not on file    Physically abused: Not on file    Forced sexual activity: Not on file  Other Topics Concern  . Not on file  Social History Narrative   Patient is right handed.   Patient drinks 1 cup of coffee daily.      PHYSICAL EXAM  Vitals:   12/21/17 0707  BP: (!) 150/75  Pulse: (!) 55  Weight: 166 lb 3.2 oz (75.4 kg)  Height: 5\' 8"  (1.727 m)   Body mass index is 25.27 kg/m.  Generalized: Well developed, in no acute distress   Neurological examination  Mentation: Alert oriented to time, place, history taking. Follows all commands speech and language fluent Cranial nerve II-XII: Pupils were equal round reactive to light. Extraocular movements were full, visual field were full on confrontational test. Facial sensation and strength were normal. Uvula tongue midline. Head turning and shoulder shrug  were normal and symmetric.  With superior gaze for 1 minute ptosis noted in the right eye.  No diplopia noted. Motor: The motor testing reveals 5 over 5 strength of all 4 extremities.  Good symmetric motor tone is noted throughout.  With arms abducted for 1 minute no weakness noted Sensory: Sensory testing is intact to soft touch on all 4 extremities. No evidence of extinction is noted.  Coordination: Cerebellar testing reveals good finger-nose-finger and heel-to-shin bilaterally.  Gait and station: Gait is normal. Reflexes: Deep tendon reflexes are symmetric and normal bilaterally.   DIAGNOSTIC DATA (LABS, IMAGING, TESTING) - I reviewed patient records, labs, notes, testing and imaging myself where available.  Lab Results  Component Value Date   WBC 9.2 03/03/2017   HGB 13.6 03/03/2017   HCT 40.4 03/03/2017   MCV 93.1 03/03/2017   PLT 192 03/03/2017      Component Value Date/Time   NA 141 03/03/2017 1024   K 3.9 03/03/2017 1024   CL 106 03/03/2017 1024   CO2 27 03/03/2017 1024   GLUCOSE 89 03/03/2017 1024   BUN 20 03/03/2017 1024   CREATININE 0.68 03/03/2017 1024   CALCIUM 9.6 03/03/2017 1024   PROT 7.8 03/03/2017 1024   ALBUMIN 4.1 03/03/2017 1024   AST 22 03/03/2017 1024   ALT 19 03/03/2017 1024   ALKPHOS 59 03/03/2017 1024   BILITOT 0.9 03/03/2017 1024   GFRNONAA >60 03/03/2017 1024   GFRAA >60 03/03/2017 1024   Lab Results  Component Value Date   CHOL 182 10/26/2014   HDL 67 10/26/2014   LDLCALC 89 10/26/2014   TRIG 130 10/26/2014   CHOLHDL 2.7 10/26/2014   Lab Results  Component Value Date   HGBA1C 5.9 (H) 10/25/2014   Lab Results  Component Value Date   VITAMINB12 327 10/26/2014  Lab Results  Component Value Date   TSH 2.420 08/02/2017      ASSESSMENT AND PLAN 83 y.o. year old male  has a past medical history of Arthritis, Auto immune neutropenia ([place]), Bullous pemphigoid, Cancer ([place]), Cervical spondylosis without myelopathy (01/22/2016), DVT (deep venous thrombosis) ([place]), Dyslipidemia, History of prostate cancer, Hypertension, Meningioma ([place]), Myasthenia ([place]) (02/14/2013), Nocturnal leg cramps, Ocular myasthenia gravis ([place]),  Seizures ([place]), Sinus bradycardia, Sleep paralysis, and Thyroid nodule, cold. here with :  1.  Myasthenia gravis 2.  Seizures 3.  Arthritic pain in the cervical region  Overall the patient is doing well.  He will continue on Mestinon 90 mg 3 times a day.  He will continue on Keppra 250 mg in the morning and 500 mg at bedtime.  Diclofenac was refilled for arthritis.  He is advised that if his symptoms worsen or he develops new symptoms he should let us know.  He will follow-up in 6 months or sooner if needed.     Ward Givens, MSN, NP-C 12/21/2017, 7:29 AM [facility] [address], [place], [place] [address] [phone]

## 2018-01-07 DIAGNOSIS — N3941 Urge incontinence: Secondary | ICD-10-CM | POA: Insufficient documentation

## 2018-01-29 ENCOUNTER — Other Ambulatory Visit: Payer: Self-pay | Admitting: Adult Health

## 2018-01-29 ENCOUNTER — Other Ambulatory Visit: Payer: Self-pay | Admitting: Neurology

## 2018-02-18 DIAGNOSIS — I10 Essential (primary) hypertension: Secondary | ICD-10-CM | POA: Diagnosis not present

## 2018-02-18 DIAGNOSIS — M47812 Spondylosis without myelopathy or radiculopathy, cervical region: Secondary | ICD-10-CM | POA: Diagnosis not present

## 2018-02-18 DIAGNOSIS — Z6824 Body mass index (BMI) 24.0-24.9, adult: Secondary | ICD-10-CM | POA: Diagnosis not present

## 2018-02-18 DIAGNOSIS — G7000 Myasthenia gravis without (acute) exacerbation: Secondary | ICD-10-CM | POA: Diagnosis not present

## 2018-02-18 DIAGNOSIS — M5030 Other cervical disc degeneration, unspecified cervical region: Secondary | ICD-10-CM | POA: Diagnosis not present

## 2018-05-04 ENCOUNTER — Other Ambulatory Visit: Payer: Self-pay | Admitting: "Endocrinology

## 2018-05-04 DIAGNOSIS — E042 Nontoxic multinodular goiter: Secondary | ICD-10-CM | POA: Diagnosis not present

## 2018-05-05 LAB — TSH: TSH: 1.94 u[IU]/mL (ref 0.450–4.500)

## 2018-05-05 LAB — T4, FREE: FREE T4: 1.27 ng/dL (ref 0.82–1.77)

## 2018-05-12 ENCOUNTER — Encounter: Payer: Self-pay | Admitting: "Endocrinology

## 2018-05-12 ENCOUNTER — Ambulatory Visit: Payer: Medicare Other | Admitting: "Endocrinology

## 2018-05-12 VITALS — BP 148/85 | HR 63 | Ht 68.0 in | Wt 172.0 lb

## 2018-05-12 DIAGNOSIS — E042 Nontoxic multinodular goiter: Secondary | ICD-10-CM
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
## 2018-06-17 DIAGNOSIS — N183 Chronic kidney disease, stage 3 (moderate): Secondary | ICD-10-CM | POA: Diagnosis not present

## 2018-06-17 DIAGNOSIS — Z6824 Body mass index (BMI) 24.0-24.9, adult: Secondary | ICD-10-CM | POA: Diagnosis not present

## 2018-06-17 DIAGNOSIS — E063 Autoimmune thyroiditis: Secondary | ICD-10-CM | POA: Diagnosis not present

## 2018-06-17 DIAGNOSIS — Z0001 Encounter for general adult medical examination with abnormal findings: Secondary | ICD-10-CM | POA: Diagnosis not present

## 2018-06-17 DIAGNOSIS — M542 Cervicalgia: Secondary | ICD-10-CM | POA: Diagnosis not present

## 2018-06-17 DIAGNOSIS — E7849 Other hyperlipidemia: Secondary | ICD-10-CM | POA: Diagnosis not present

## 2018-06-17 DIAGNOSIS — Z1389 Encounter for screening for other disorder: Secondary | ICD-10-CM | POA: Diagnosis not present

## 2018-06-17 DIAGNOSIS — I10 Essential (primary) hypertension: Secondary | ICD-10-CM | POA: Diagnosis not present

## 2018-06-20 ENCOUNTER — Other Ambulatory Visit: Payer: Self-pay | Admitting: *Deleted

## 2018-06-20 ENCOUNTER — Other Ambulatory Visit: Payer: Self-pay

## 2018-06-20 MED ORDER — PYRIDOSTIGMINE BROMIDE 60 MG PO TABS: 90.0000 mg | ORAL_TABLET | Freq: Three times a day (TID) | ORAL | 3 refills | Status: DC

## 2018-06-20 MED ORDER — GABAPENTIN 400 MG PO CAPS: 400.0000 mg | ORAL_CAPSULE | Freq: Every day | ORAL | 3 refills | Status: DC

## 2018-06-21 DIAGNOSIS — H6123 Impacted cerumen, bilateral: Secondary | ICD-10-CM | POA: Diagnosis not present

## 2018-06-21 DIAGNOSIS — E7800 Pure hypercholesterolemia, unspecified: Secondary | ICD-10-CM | POA: Insufficient documentation

## 2018-06-21 DIAGNOSIS — C801 Malignant (primary) neoplasm, unspecified: Secondary | ICD-10-CM | POA: Insufficient documentation

## 2018-06-21 DIAGNOSIS — H9193 Unspecified hearing loss, bilateral: Secondary | ICD-10-CM | POA: Diagnosis not present

## 2018-06-21 DIAGNOSIS — M542 Cervicalgia: Secondary | ICD-10-CM | POA: Diagnosis not present

## 2018-06-21 DIAGNOSIS — M5030 Other cervical disc degeneration, unspecified cervical region: Secondary | ICD-10-CM | POA: Diagnosis not present

## 2018-06-26 ENCOUNTER — Telehealth: Payer: Self-pay | Admitting: *Deleted

## 2018-06-26 NOTE — Telephone Encounter (Signed)
Left message letting patient know our office will be closed, on 06/27/2018, to disinfect the facility. We will call back to reschedule the appointment that was scheduled for that day.

## 2018-06-27 ENCOUNTER — Ambulatory Visit: Payer: Medicare Other | Admitting: Neurology

## 2018-06-27 ENCOUNTER — Telehealth: Payer: Self-pay

## 2018-06-27 NOTE — Telephone Encounter (Signed)
I contacted the pt to reschedule appt from 06/27/18 pt rescheduled to 07/05/18 845 am with SS, NP.

## 2018-06-28 ENCOUNTER — Ambulatory Visit (HOSPITAL_COMMUNITY): Payer: Medicare Other | Attending: Orthopedic Surgery

## 2018-06-28 ENCOUNTER — Encounter (HOSPITAL_COMMUNITY): Payer: Self-pay

## 2018-06-28 ENCOUNTER — Other Ambulatory Visit: Payer: Self-pay

## 2018-06-28 DIAGNOSIS — M542 Cervicalgia: Secondary | ICD-10-CM | POA: Insufficient documentation

## 2018-06-28 DIAGNOSIS — R29898 Other symptoms and signs involving the musculoskeletal system: Secondary | ICD-10-CM | POA: Insufficient documentation

## 2018-06-28 DIAGNOSIS — R293 Abnormal posture: Secondary | ICD-10-CM | POA: Insufficient documentation

## 2018-06-28 NOTE — Therapy (Signed)
[facility] [address] [place], [place], [address] Phone: [phone]   Fax:  [phone]  Physical Therapy Evaluation  Patient Details  Name: Kyle [name]. MRN: [id number] Date of Birth: 04/20/1934 Referring Provider (PT): Melina Schools, MD   Encounter Date: 06/28/2018  PT End of Session - 06/28/18 0938    Visit Number  1    Number of Visits  8    Date for PT Re-Evaluation  07/26/18    Authorization Type  [employer] Medicare    Authorization Time Period  06/28/18 to 07/26/18    Authorization - Visit Number  1    Authorization - Number of Visits  10    PT Start Time  0845    PT Stop Time  0928    PT Time Calculation (min)  43 min    Activity Tolerance  Patient tolerated treatment well;No increased pain    Behavior During Therapy  WFL for tasks assessed/performed       Past Medical History:  Diagnosis Date  . Arthritis   . Auto immune neutropenia (HCC)   . Bullous pemphigoid   . Cancer [facility])    Prostate  . Cervical spondylosis without myelopathy 01/22/2016  . DVT (deep venous thrombosis) ([place])    a. Has had a previous right lower extremity DVT in the setting of hospitalization and surgery (after his brain tumor was resected in 1993) but is no longer on Coumadin.  Marland Kitchen Dyslipidemia   . History of prostate cancer   . Hypertension   . Meningioma ([place])    a. s/p resection 1990s.  . Myasthenia ([place]) 02/14/2013  . Nocturnal leg cramps   . Ocular myasthenia gravis ([place])   . Seizures ([place])   . Sinus bradycardia   . Sleep paralysis   . Thyroid nodule, cold     Past Surgical History:  Procedure Laterality Date  . BRAIN SURGERY    . CHOLECYSTECTOMY    . COLONOSCOPY N/A 07/25/2013   Procedure: COLONOSCOPY;  Surgeon: Jamesetta So, MD;  [facility];  Service: Gastroenterology;  Laterality: N/A;  . COLONOSCOPY N/A 11/10/2016   Procedure: COLONOSCOPY;  Surgeon: Aviva Signs, MD;  [facility];  Service:  Gastroenterology;  Laterality: N/A;  . IR CATHETER TUBE CHANGE  01/02/2017  . IR RADIOLOGIST EVAL & MGMT  01/06/2017  . IR RADIOLOGIST EVAL & MGMT  01/20/2017  . IR RADIOLOGIST EVAL & MGMT  02/03/2017  . IR RADIOLOGIST EVAL & MGMT  02/17/2017  . IR SINUS/FIST TUBE CHK-NON GI  03/03/2017  . RADIOACTIVE SEED IMPLANT      There were no vitals filed for this visit.   Subjective Assessment - 06/28/18 0850    Subjective  Pt reports that he has been having neck pain when he turns his head or moves his head. He states that it has bene going on for several years but it has worsened over the last few months. Dr. Rolena Infante took x-rays of his neck which showed degenerative disc in his neck. He states that heat helps a little but if he does anything physical, later on he has increased pain. His pain is located on the sides of his neck, some on the back of his neck but no pain down into upper trap region, shoulders, no n/t or b/b issues since this started. States his hands can go to sleep when he is trying to sleep but not sure if this is the way he  is positioned or not. No HA.    Limitations  Lifting;House hold activities    Diagnostic tests  x-rays    Patient Stated Goals  some degree of easing the pain    Currently in Pain?  No/denies         [facility] PT Assessment - 06/28/18 0001      Assessment   Medical Diagnosis  degeneration of cervical intervertebral disc    Referring Provider (PT)  Melina Schools, MD    Onset Date/Surgical Date  --   years ago, worsening over the last few months   Hand Dominance  Right    Next MD Visit  07/04/18    Prior Therapy  none      Balance Screen   Has the patient fallen in the past 6 months  No    Has the patient had a decrease in activity level because of a fear of falling?   No    Is the patient reluctant to leave their home because of a fear of falling?   No      Prior Function   Level of Independence  Independent    Vocation  Retired    Leisure  work in the  garden, Haematologist, watching the ballgames      Observation/Other Assessments   Focus on Therapeutic Outcomes (FOTO)   25% limitation      ROM / Strength   AROM / PROM / Strength  AROM;Strength      AROM   AROM Assessment Site  Cervical    Cervical Flexion  40   all from lower cervical spine   Cervical Extension  36    Cervical - Right Side Bend  15    Cervical - Left Side Bend  17    Cervical - Right Rotation  65    Cervical - Left Rotation  62      Strength   Strength Assessment Site  Shoulder;Elbow;Wrist;Hand    Right Shoulder Flexion  4+/5    Right Shoulder ABduction  4+/5    Right Shoulder Internal Rotation  5/5    Right Shoulder External Rotation  5/5    Left Shoulder Flexion  4+/5    Left Shoulder ABduction  4+/5    Left Shoulder Internal Rotation  5/5    Left Shoulder External Rotation  5/5    Right Elbow Flexion  5/5    Right Elbow Extension  5/5    Left Elbow Flexion  5/5    Left Elbow Extension  5/5    Right Wrist Flexion  5/5    Right Wrist Extension  5/5    Left Wrist Flexion  5/5    Left Wrist Extension  5/5    Right Hand Gross Grasp  Functional    Left Hand Gross Grasp  Functional      Palpation   Spinal mobility  hypomobile per gross assessment    Palpation comment  max restrictions in bil upper trap, levator scap, SCM, scalenes, cervical paraspinals, and suboccipitals; palpation to levator scap, SCM, and paraspinals recreated same pain but palpation throughout all mm groups tender      Special Tests    Special Tests  --    Cervical Tests  --          Objective measurements completed on examination: See above findings.        PT Education - 06/28/18 5465    Education Details  exam findings, HEP, POC  Person(s) Educated  Patient    Methods  Explanation;Demonstration;Handout    Comprehension  Verbalized understanding;Returned demonstration       PT Short Term Goals - 06/28/18 0941      PT SHORT TERM GOAL #1   Title  Pt will have  improved cervical AROM by 5deg throughout all in order to demo reduced restrictions and improve his overall function.     Time  2    Period  Weeks    Status  New    Target Date  07/12/18      PT SHORT TERM GOAL #2   Title  Pt will report being able to participate in his regular workout routine with 3/10 neck pain or less afterwards to demo improved overall strength.    Time  2    Period  Weeks    Status  New        PT Long Term Goals - 06/28/18 9629      PT LONG TERM GOAL #1   Title  Pt will have improved cervical AROM by 10deg throughout flexion, extension, and bil rotation in order to further reduce pain and maximize his [facility] duties with greater ease.     Time  4    Period  Weeks    Status  New    Target Date  07/26/18      PT LONG TERM GOAL #2   Title  Pt will report being able to perform yardwork with greater ease and with less neck pain following to demo improved strength and overall function at home.     Time  4    Period  Weeks    Status  New      PT LONG TERM GOAL #3   Title  Pt will have reduced soft tissue restrictions to moderate throughout cervical mm in order to reduce overall pain and maximize ROM.     Time  4    Period  Weeks    Status  New             Plan - 06/28/18 0939    Clinical Impression Statement  Pt is pleasant 84YO M who presents to OPPT with c/o chronic bil neck pain of insidious onset. He presents with deficits in ROM, posture, joint hypomobility, functional strength, and functional mobility due to neck pain. Pt noted to have increased forward head posture, increased thoracic kyphosis, and most of his cervical ROM appeared to occur at lower cervical spine. Pt also with palpable restrictions and tightness throughout cervical mm, mostly in levator scap, upper cervical paraspinals/suboccipitals, and SCM. Pt needs skilled PT intervention to address these impairments in order to reduce pain and maximize function at home with less pain.     Personal  Factors and Comorbidities  Age;Comorbidity 3+    Examination-Activity Limitations  Lift    Examination-Participation Restrictions  Yard Work    Stability/Clinical Decision Making  Stable/Uncomplicated    Clinical Decision Making  Low    Rehab Potential  Good    PT Frequency  2x / week    PT Duration  4 weeks    PT Treatment/Interventions  ADLs/Self Care Home Management;Aquatic Therapy;Cryotherapy;Electrical Stimulation;Moist Heat;Traction;Functional mobility training;Therapeutic activities;Ultrasound;Therapeutic exercise;Balance training;Neuromuscular re-education;Patient/family education;Orthotic Fit/Training;Manual techniques;Scar mobilization;Passive range of motion;Dry needling;Taping    PT Next Visit Plan  review goals, begin cervical retractions, stretching, pec stretching, and other postural training/strengthening, cervical and thoraic mobility work, manual for soft tissue restrictions and suboccipistal release    PT Home  Exercise Plan  eval: seated scap retraction    Consulted and Agree with Plan of Care  Patient       Patient will benefit from skilled therapeutic intervention in order to improve the following deficits and impairments:  Decreased activity tolerance, Decreased mobility, Decreased range of motion, Decreased strength, Hypomobility, Increased fascial restricitons, Increased muscle spasms, Impaired flexibility, Improper body mechanics, Postural dysfunction, Pain  Visit Diagnosis: Cervicalgia - Plan: PT plan of care cert/re-cert  Abnormal posture - Plan: PT plan of care cert/re-cert  Other symptoms and signs involving the musculoskeletal system - Plan: PT plan of care cert/re-cert     Problem List Patient Active Problem List   Diagnosis Date Noted  . Intra-abdominal abscess ([place])   . Abscess of sigmoid colon due to diverticulitis   . Acute diverticulitis of intestine 12/27/2016  . Bowel perforation ([place])   . Perforation and abscess of large intestine concurrent  with and due to diverticulitis 12/15/2016  . Blood in stool   . Second degree hemorrhoids   . Diverticulosis of large intestine without diverticulitis   . Essential hypertension, benign 02/03/2016  . Cervical spondylosis without myelopathy 01/22/2016  . Nocturnal leg cramps 07/24/2015  . Nontoxic multinodular goiter 01/31/2015  . Elevated transaminase level 10/26/2014  . Thrombocytopenia ([place]) 10/25/2014  . Myasthenia ([place]) 02/14/2013  . Convulsions/seizures ([place]) 02/14/2013        Geraldine Solar PT, [facility] [address] [place], [place], [address] Phone: [phone]   Fax:  [phone]  Name: Sal Spratley. MRN: [id number] Date of Birth: 1934/12/04

## 2018-06-30 ENCOUNTER — Other Ambulatory Visit: Payer: Self-pay

## 2018-06-30 ENCOUNTER — Ambulatory Visit (HOSPITAL_COMMUNITY): Payer: Medicare Other | Admitting: Physical Therapy

## 2018-06-30 ENCOUNTER — Encounter (HOSPITAL_COMMUNITY): Payer: Self-pay | Admitting: Physical Therapy

## 2018-06-30 DIAGNOSIS — M542 Cervicalgia: Secondary | ICD-10-CM | POA: Diagnosis not present

## 2018-06-30 DIAGNOSIS — R29898 Other symptoms and signs involving the musculoskeletal system: Secondary | ICD-10-CM

## 2018-06-30 DIAGNOSIS — R293 Abnormal posture: Secondary | ICD-10-CM | POA: Diagnosis not present

## 2018-06-30 NOTE — Therapy (Signed)
[facility] [place], [place], [address] Phone: [phone]   Fax:  [phone]  Physical Therapy Treatment  Patient Details  Name: Kyle [name]. MRN: [id number] Date of Birth: 05/13/1934 Referring Provider (PT): Melina Schools, MD   Encounter Date: 06/30/2018  PT End of Session - 06/30/18 1015    Visit Number  2    Number of Visits  8    Date for PT Re-Evaluation  07/26/18    Authorization Type  [employer]HC Medicare    Authorization Time Period  06/28/18 to 07/26/18    Authorization - Visit Number  2    Authorization - Number of Visits  10    PT Start Time  0905    PT Stop Time  0945    PT Time Calculation (min)  40 min    Activity Tolerance  Patient tolerated treatment well;No increased pain    Behavior During Therapy  WFL for tasks assessed/performed       Past Medical History:  Diagnosis Date  . Arthritis   . Auto immune neutropenia (HCC)   . Bullous pemphigoid   . Cancer [facility])    Prostate  . Cervical spondylosis without myelopathy 01/22/2016  . DVT (deep venous thrombosis) ([place])    a. Has had a previous right lower extremity DVT in the setting of hospitalization and surgery (after his brain tumor was resected in 1993) but is no longer on Coumadin.  Marland Kitchen Dyslipidemia   . History of prostate cancer   . Hypertension   . Meningioma ([place])    a. s/p resection 1990s.  . Myasthenia ([place]) 02/14/2013  . Nocturnal leg cramps   . Ocular myasthenia gravis ([place])   . Seizures ([place])   . Sinus bradycardia   . Sleep paralysis   . Thyroid nodule, cold     Past Surgical History:  Procedure Laterality Date  . BRAIN SURGERY    . CHOLECYSTECTOMY    . COLONOSCOPY N/A 07/25/2013   Procedure: COLONOSCOPY;  Surgeon: Jamesetta So, MD;  [facility];  Service: Gastroenterology;  Laterality: N/A;  . COLONOSCOPY N/A 11/10/2016   Procedure: COLONOSCOPY;  Surgeon: Aviva Signs, MD;  [facility];  Service:  Gastroenterology;  Laterality: N/A;  . IR CATHETER TUBE CHANGE  01/02/2017  . IR RADIOLOGIST EVAL & MGMT  01/06/2017  . IR RADIOLOGIST EVAL & MGMT  01/20/2017  . IR RADIOLOGIST EVAL & MGMT  02/03/2017  . IR RADIOLOGIST EVAL & MGMT  02/17/2017  . IR SINUS/FIST TUBE CHK-NON GI  03/03/2017  . RADIOACTIVE SEED IMPLANT      There were no vitals filed for this visit.  Subjective Assessment - 06/30/18 0909    Subjective  Pt states that he can move his neck but it hurts.      Limitations  Lifting;House hold activities    Diagnostic tests  x-rays    Patient Stated Goals  some degree of easing the pain    Currently in Pain?  Yes    Pain Score  5     Pain Location  Neck    Pain Orientation  Left;Right    Pain Descriptors / Indicators  Sore;Aching    Pain Type  Chronic pain    Pain Onset  1 to 4 weeks ago    Pain Frequency  Intermittent    Aggravating Factors   moving     Pain Relieving Factors  keeping still    Effect  of Pain on Daily Activities  does them just has increased pain                        [facility] Adult PT Treatment/Exercise - 06/30/18 0001      Exercises   Exercises  Neck      Neck Exercises: Seated   Cervical Rotation  Both;5 reps    Lateral Flexion  Both;5 reps    Postural Training  sitting tall x 5 reps       Neck Exercises: Supine   Cervical Isometrics  Extension;Right lateral flexion;Left lateral flexion;3 secs;5 reps    Neck Retraction  10 reps    Other Supine Exercise  scapular retraction x 10       Manual Therapy   Manual Therapy  Joint mobilization;Soft tissue mobilization;Manual Traction    Manual therapy comments  done seperate from all other activities of therapy     Joint Mobilization  Grade II and II to improve motion     Soft tissue mobilization  to decrease spasm and pain     Manual Traction  15" HOLD X 5             PT Education - 06/30/18 1012    Education Details  The effects of gravity on our spine,  the stress that occurs  when you have a forward head.     Person(s) Educated  Patient    Methods  Explanation;Handout    Comprehension  Verbalized understanding       PT Short Term Goals - 06/30/18 1037      PT SHORT TERM GOAL #1   Title  Pt will have improved cervical AROM by 5deg throughout all in order to demo reduced restrictions and improve his overall function.     Time  2    Period  Weeks    Status  On-going    Target Date  07/12/18      PT SHORT TERM GOAL #2   Title  Pt will report being able to participate in his regular workout routine with 3/10 neck pain or less afterwards to demo improved overall strength.    Time  2    Period  Weeks    Status  On-going        PT Long Term Goals - 06/30/18 1038      PT LONG TERM GOAL #1   Title  Pt will have improved cervical AROM by 10deg throughout flexion, extension, and bil rotation in order to further reduce pain and maximize his [facility] duties with greater ease.     Time  4    Period  Weeks    Status  On-going      PT LONG TERM GOAL #2   Title  Pt will report being able to perform yardwork with greater ease and with less neck pain following to demo improved strength and overall function at home.     Time  4    Period  Weeks    Status  On-going      PT LONG TERM GOAL #3   Title  Pt will have reduced soft tissue restrictions to moderate throughout cervical mm in order to reduce overall pain and maximize ROM.     Time  4    Period  Weeks    Status  New            Plan - 06/30/18 1015    Clinical Impression Statement  Evaluation and goals reviewed with pt.  PT using substitutional patterns with instructed in sitting cervical and scapular retraction therefore instructed in supine position.     Personal Factors and Comorbidities  Age;Comorbidity 3+    Examination-Activity Limitations  Lift    Examination-Participation Restrictions  Yard Work    Stability/Clinical Decision Making  Stable/Uncomplicated    Rehab Potential  Good    PT Frequency   2x / week    PT Duration  4 weeks    PT Treatment/Interventions  ADLs/Self Care Home Management;Aquatic Therapy;Cryotherapy;Electrical Stimulation;Moist Heat;Traction;Functional mobility training;Therapeutic activities;Ultrasound;Therapeutic exercise;Balance training;Neuromuscular re-education;Patient/family education;Orthotic Fit/Training;Manual techniques;Scar mobilization;Passive range of motion;Dry needling;Taping    PT Next Visit Plan   stretching, pec stretching, and other postural training/strengthening, cervical and thoraic mobility work, manual for soft tissue restrictions and suboccipistal release    PT Home Exercise Plan  eval: seated scap retraction    Consulted and Agree with Plan of Care  Patient       Patient will benefit from skilled therapeutic intervention in order to improve the following deficits and impairments:  Decreased activity tolerance, Decreased mobility, Decreased range of motion, Decreased strength, Hypomobility, Increased fascial restricitons, Increased muscle spasms, Impaired flexibility, Improper body mechanics, Postural dysfunction, Pain  Visit Diagnosis: Cervicalgia  Abnormal posture  Other symptoms and signs involving the musculoskeletal system     Problem List Patient Active Problem List   Diagnosis Date Noted  . Intra-abdominal abscess ([place])   . Abscess of sigmoid colon due to diverticulitis   . Acute diverticulitis of intestine 12/27/2016  . Bowel perforation ([place])   . Perforation and abscess of large intestine concurrent with and due to diverticulitis 12/15/2016  . Blood in stool   . Second degree hemorrhoids   . Diverticulosis of large intestine without diverticulitis   . Essential hypertension, benign 02/03/2016  . Cervical spondylosis without myelopathy 01/22/2016  . Nocturnal leg cramps 07/24/2015  . Nontoxic multinodular goiter 01/31/2015  . Elevated transaminase level 10/26/2014  . Thrombocytopenia ([place]) 10/25/2014  . Myasthenia  ([place]) 02/14/2013  . Convulsions/seizures [facility]) 02/14/2013    Rayetta Humphrey, PT CLT [phone] 06/30/2018, 10:38 AM  [facility] [address] [place], [place], [address] Phone: [phone]   Fax:  [phone]  Name: Jamerius Boeckman. MRN: [id number] Date of Birth: 1934-07-17

## 2018-07-01 ENCOUNTER — Telehealth (HOSPITAL_COMMUNITY): Payer: Self-pay

## 2018-07-01 NOTE — Telephone Encounter (Signed)
Spoke to pt's wife and informed her that our clinic would be closed for the next 2 weeks due to COVID-19. Told her that his next scheduled appointment would be 07/19/18. Educated her that the pt could call our office back if he had any questions.    Geraldine Solar PT, DPT

## 2018-07-04 DIAGNOSIS — R52 Pain, unspecified: Secondary | ICD-10-CM | POA: Diagnosis not present

## 2018-07-05 ENCOUNTER — Ambulatory Visit: Payer: Self-pay | Admitting: Neurology

## 2018-07-05 ENCOUNTER — Ambulatory Visit (HOSPITAL_COMMUNITY): Payer: Medicare Other

## 2018-07-05 ENCOUNTER — Telehealth (HOSPITAL_COMMUNITY): Payer: Self-pay

## 2018-07-05 NOTE — Telephone Encounter (Signed)
Spoke to pt's wife as she stated he was not home at the moment. Asked if he felt okay with his current exercises and how he was doing overall and she said that he told her his neck was a little sore this morning. Told Mrs. Isenberg that if he wanted to call back when he got home that I could talk with him and see if he wanted more exercises to try and she verbalized understanding.   Geraldine Solar PT, DPT

## 2018-07-07 ENCOUNTER — Ambulatory Visit (HOSPITAL_COMMUNITY): Payer: Medicare Other

## 2018-07-12 ENCOUNTER — Encounter (HOSPITAL_COMMUNITY): Payer: Medicare Other

## 2018-07-13 ENCOUNTER — Telehealth: Payer: Self-pay | Admitting: Neurology

## 2018-07-13 ENCOUNTER — Telehealth (HOSPITAL_COMMUNITY): Payer: Self-pay

## 2018-07-13 NOTE — Telephone Encounter (Signed)
Spoke to pt's wife regarding our clinic cancelling all appointments for further notice due to COVID-19 to ensure pt and staff safety during this time. Pt's wife stated they did not have capability for telehealth sessions but stated we could continue weekly calls to check in on how he is doing. She said to call them once we reopen to get him scheduled.   Geraldine Solar PT, DPT

## 2018-07-13 NOTE — Telephone Encounter (Signed)
I called the patient to set up virtual visit. They do not have smart phone or computer. No way to do virtual visit. We will do telephone visit. I talked with his wife and confirmed the time. 4/7 10;45

## 2018-07-14 ENCOUNTER — Encounter (HOSPITAL_COMMUNITY): Payer: Medicare Other

## 2018-07-19 ENCOUNTER — Ambulatory Visit (HOSPITAL_COMMUNITY): Payer: Medicare Other

## 2018-07-19 ENCOUNTER — Other Ambulatory Visit: Payer: Self-pay

## 2018-07-19 ENCOUNTER — Encounter: Payer: Self-pay | Admitting: Neurology

## 2018-07-19 ENCOUNTER — Ambulatory Visit (INDEPENDENT_AMBULATORY_CARE_PROVIDER_SITE_OTHER): Payer: Medicare Other | Admitting: Neurology

## 2018-07-19 DIAGNOSIS — G7000 Myasthenia gravis without (acute) exacerbation: Secondary | ICD-10-CM | POA: Diagnosis not present

## 2018-07-19 DIAGNOSIS — R569 Unspecified convulsions: Secondary | ICD-10-CM

## 2018-07-19 MED ORDER — GABAPENTIN 100 MG PO CAPS: ORAL_CAPSULE | ORAL | 3 refills | Status: DC

## 2018-07-19 MED ORDER — PREDNISONE 5 MG PO TABS: 5.0000 mg | ORAL_TABLET | Freq: Every day | ORAL | 5 refills | Status: DC

## 2018-07-19 NOTE — Progress Notes (Signed)
Virtual Visit via Telephone Note  I connected with Kyle [name]. on 07/19/18 at 10:45 AM EDT by telephone and verified that I am speaking with the correct person using two identifiers.   I discussed the limitations, risks, security and privacy concerns of performing an evaluation and management service by telephone and the availability of in person appointments. I also discussed with the patient that there may be a patient responsible charge related to this service. The patient expressed understanding and agreed to proceed.  History of Present Illness: 07/19/2018 SS: Kyle [name] is a 84 year old male with history of myasthenia gravis with ocular features. He has come off prednisone in May 2019. He remains on mestinon 90 mg three times daily. He remains on Keppra 250 mg in the morning, 500 mg at bedtime for seizure history. He has not had any seizure events.  I talked to Kyle [name] on the phone today.  He reports that he has been doing fairly well.  He does report that he is having more ptosis whenever he is reading, worse in the right eye.  He reports that he will have to open his eyelids to continue reading.  He does find the Mestinon to be some beneficial.  He denies any weakness in his hands or arms.  He denies any difficulty swallowing.  He denies any diplopia.  He continues to report generalized leg weakness, feels like for the last year his legs have become weaker.  He reports that he will avoid stairs, climbing ladders because his legs feel so weak.  He says that in general he is a very active 84 year old, still manages 3 yards, and has his own garden.  He is still taking gabapentin 400 mg at bedtime for pain in his legs.  He feels the medication is wearing off because he will wake up in the middle the night with leg pain.  12/21/2017 MM: Kyle [name] is an 84 year old male with a history of myasthenia gravis with ocular features.  He returns today for follow-up.  He no longer is on prednisone.   He reports that his symptoms did not worsen when he came off of prednisone.  He remains on Mestinon 90 mg 3 times a day.  He reports that he continues to notice ptosis in the right eye.  He denies diplopia.  Denies any trouble chewing or swallowing.  He reports generalized weakness in the legs.  Denies any weakness in the arms.  Denies any trouble breathing.  He continues on Keppra.  Denies any seizure events.  Reports that since the last visit he has had 2 surgeries.  One was for a hole in the colon and another one was for a hole in the bladder.  He returns today for evaluation.   Observations/Objective: Alert, answers questions appropriately, knowledgeable of her condition, speech is clear and concise (telephone visit)  Assessment and Plan: 1.  Myasthenia gravis, ocular 2. Seizures   Overall today he is complaining of worsening ptosis in both of his eyes when reading, right greater than the left.  He also continues to complain of generalized leg weakness however it has gotten worse in the last year.  I talked with Dr. Jannifer Franklin. He is currently taking Mestinon 90 mg 3 times daily, he will increase his Mestinon to 4 times daily.  He will restart prednisone 5 mg daily.  I will increase his gabapentin to 600 mg at bedtime. He has not had any seizure events, he will continue taking  Keppra.  Today I have sent in the prescription for gabapentin, 100 mg capsules, taking 2 capsules at bedtime with his gabapentin 400 mg.  He just received a 19-month supply from [facility] and would like to use these up.  He will call when his medication is running low so that we can send in a new prescription for gabapentin 600 mg at bedtime.  He also would like to use of his Mestinon which he just received a 69-month supply.  He will call for a new prescription when he starts running low.  He will follow-up in the office in 3 to 4 months for a revisit.  Follow Up Instructions: 3 to 4 months for revisit   I discussed the  assessment and treatment plan with the patient. The patient was provided an opportunity to ask questions and all were answered. The patient agreed with the plan and demonstrated an understanding of the instructions.   The patient was advised to call back or seek an in-person evaluation if the symptoms worsen or if the condition fails to improve as anticipated.  I provided 30 minutes of non-face-to-face time during this encounter.  Evangeline Dakin, DNP  [facility] [address], [place], [place] [address] [phone]

## 2018-07-19 NOTE — Progress Notes (Signed)
I have read the note, and I agree with the clinical assessment and plan.  Kyle [name] Kyle [name]   

## 2018-07-20 ENCOUNTER — Telehealth: Payer: Self-pay | Admitting: Neurology

## 2018-07-20 NOTE — Telephone Encounter (Signed)
I called the patient.  He is currently taking magnesium for leg cramps.  I advised him to stop taking the magnesium at this time given his reported leg weakness, ptosis related to his myasthenia gravis.  He verbalized understanding.

## 2018-07-21 ENCOUNTER — Encounter (HOSPITAL_COMMUNITY): Payer: Medicare Other

## 2018-07-25 ENCOUNTER — Telehealth (HOSPITAL_COMMUNITY): Payer: Self-pay

## 2018-07-25 NOTE — Telephone Encounter (Signed)
Attempted to call pt for weekly check-in, however, no answer and no answering machine setup. Will call back at a later time/day as able/appropriate.  Geraldine Solar PT, DPT

## 2018-08-01 ENCOUNTER — Telehealth (HOSPITAL_COMMUNITY): Payer: Self-pay

## 2018-08-01 NOTE — Telephone Encounter (Signed)
Weekly check-in phone call made to pt this date but no answer so left a voicemail. Educated pt that if had any questions or concerns about his HEP or pain that he could call our office back, otherwise, we would call again next week.   Geraldine Solar PT, DPT

## 2018-08-02 DIAGNOSIS — M5030 Other cervical disc degeneration, unspecified cervical region: Secondary | ICD-10-CM | POA: Diagnosis not present

## 2018-08-12 ENCOUNTER — Telehealth (HOSPITAL_COMMUNITY): Payer: Self-pay | Admitting: Physical Therapy

## 2018-08-12 NOTE — Telephone Encounter (Signed)
Therapist called to check-in on the patient and spoke to patient about how he is doing. Patient reported he is feeling about the same. He stated he has been trying to do his exercises when he can. He said he does not have any questions about them and does not need any updates on his HEP. He also stated he does not need continued weekly calls, but wishes to be called when the clinic re-opens. Patient stated he did get some injections about a month ago, but that he did not feel that this had helped. Therapist explained that patient would be called when the clinic re-opened and that in the meantime if the patient had any questions or concerns he could call the clinic phone number.  Clarene Critchley PT, DPT 10:16 AM, 08/12/18 [phone]

## 2018-08-21 ENCOUNTER — Other Ambulatory Visit: Payer: Self-pay | Admitting: Adult Health

## 2018-08-22 ENCOUNTER — Telehealth: Payer: Self-pay | Admitting: Neurology

## 2018-08-22 NOTE — Telephone Encounter (Signed)
Pt has called for a refill on his gabapentin (NEURONTIN) pt states this should be for 600mg  once a day and the  pyridostigmine (MESTINON) 60 MG tablet is for a total of 90 mg  4 times a day.  Please call into  [place]

## 2018-08-23 ENCOUNTER — Telehealth: Payer: Self-pay | Admitting: Neurology

## 2018-08-23 ENCOUNTER — Telehealth (HOSPITAL_COMMUNITY): Payer: Self-pay

## 2018-08-23 MED ORDER — GABAPENTIN 600 MG PO TABS: 600.0000 mg | ORAL_TABLET | Freq: Every day | ORAL | 3 refills | Status: DC

## 2018-08-23 MED ORDER — PYRIDOSTIGMINE BROMIDE 60 MG PO TABS: 90.0000 mg | ORAL_TABLET | Freq: Four times a day (QID) | ORAL | 3 refills | Status: DC

## 2018-08-23 NOTE — Telephone Encounter (Signed)
I called the patient and left a message. I have sent in a new rx for gabapentin 600 mg once daily, and Mestinon 90 mg up to 4 times daily as needed to [facility] per patient request.

## 2018-08-23 NOTE — Telephone Encounter (Signed)
I called Mr. Marques number on file and left a message informing him that our office is re-opening and will be able to see him in clinic if he is interested in returning to physical therapy for his neck pain. I asked that he call our front office number to let us know what he would like to do.  Kipp Brood, PT, DPT, [facility] Physical Therapist with [facility]  08/23/2018 11:42 AM

## 2018-09-06 ENCOUNTER — Encounter (HOSPITAL_COMMUNITY): Payer: Self-pay | Admitting: Physical Therapy

## 2018-09-06 ENCOUNTER — Other Ambulatory Visit: Payer: Self-pay

## 2018-09-06 ENCOUNTER — Ambulatory Visit (HOSPITAL_COMMUNITY): Payer: Medicare Other | Attending: Orthopedic Surgery | Admitting: Physical Therapy

## 2018-09-06 DIAGNOSIS — M542 Cervicalgia: Secondary | ICD-10-CM | POA: Insufficient documentation

## 2018-09-06 DIAGNOSIS — R29898 Other symptoms and signs involving the musculoskeletal system: Secondary | ICD-10-CM | POA: Insufficient documentation

## 2018-09-06 DIAGNOSIS — R293 Abnormal posture: Secondary | ICD-10-CM | POA: Diagnosis not present

## 2018-09-06 NOTE — Therapy (Signed)
[facility] [address] [place], [place], [address] Phone: [phone]   Fax:  [phone]  Physical Therapy Treatment / Re-assessment  Patient Details  Name: Kyle [name]. MRN: [id number] Date of Birth: 23-Nov-1934 Referring Provider (PT): Melina Schools, MD   Encounter Date: 09/06/2018   Progress Note Reporting Period 06/28/18  To 09/06/18  See note below for Objective Data and Assessment of Progress/Goals.       PT End of Session - 09/06/18 1046    Visit Number  3    Number of Visits  16    Date for PT Re-Evaluation  07/26/18    Authorization Type  [employer] Medicare    Authorization Time Period  06/28/18 to 07/26/18    Authorization - Visit Number  1    Authorization - Number of Visits  10    PT Start Time  1030    PT Stop Time  1110    PT Time Calculation (min)  40 min    Activity Tolerance  Patient tolerated treatment well;No increased pain    Behavior During Therapy  WFL for tasks assessed/performed       Past Medical History:  Diagnosis Date  . Arthritis   . Auto immune neutropenia (HCC)   . Bullous pemphigoid   . Cancer [facility])    Prostate  . Cervical spondylosis without myelopathy 01/22/2016  . DVT (deep venous thrombosis) ([place])    a. Has had a previous right lower extremity DVT in the setting of hospitalization and surgery (after his brain tumor was resected in 1993) but is no longer on Coumadin.  Marland Kitchen Dyslipidemia   . History of prostate cancer   . Hypertension   . Meningioma ([place])    a. s/p resection 1990s.  . Myasthenia ([place]) 02/14/2013  . Nocturnal leg cramps   . Ocular myasthenia gravis ([place])   . Seizures ([place])   . Sinus bradycardia   . Sleep paralysis   . Thyroid nodule, cold     Past Surgical History:  Procedure Laterality Date  . BRAIN SURGERY    . CHOLECYSTECTOMY    . COLONOSCOPY N/A 07/25/2013   Procedure: COLONOSCOPY;  Surgeon: Jamesetta So, MD;  [facility];  Service:  Gastroenterology;  Laterality: N/A;  . COLONOSCOPY N/A 11/10/2016   Procedure: COLONOSCOPY;  Surgeon: Aviva Signs, MD;  Location: [facility];  Service: Gastroenterology;  Laterality: N/A;  . IR CATHETER TUBE CHANGE  01/02/2017  . IR RADIOLOGIST EVAL & MGMT  01/06/2017  . IR RADIOLOGIST EVAL & MGMT  01/20/2017  . IR RADIOLOGIST EVAL & MGMT  02/03/2017  . IR RADIOLOGIST EVAL & MGMT  02/17/2017  . IR SINUS/FIST TUBE CHK-NON GI  03/03/2017  . RADIOACTIVE SEED IMPLANT      There were no vitals filed for this visit.  Subjective Assessment - 09/06/18 1031    Subjective  Patient reported that his neck has been a little better since he got some gel from the MD. He stated he is still having difficulty with yardwork.    Limitations  Lifting;House hold activities    Diagnostic tests  x-rays    Patient Stated Goals  some degree of easing the pain    Currently in Pain?  Yes    Pain Score  3     Pain Location  Neck    Pain Orientation  Right;Left    Pain Descriptors / Indicators  Aching    Pain Type  Chronic pain    Pain Onset  1 to 4 weeks ago         [facility] PT Assessment - 09/06/18 0001      Assessment   Medical Diagnosis  degeneration of cervical intervertebral disc    Referring Provider (PT)  Melina Schools, MD    Hand Dominance  Right    Prior Therapy  none      Prior Function   Level of Independence  Independent    Vocation  Retired    Leisure  work in the garden, Haematologist, watching the ballgames      Observation/Other Assessments   Focus on Therapeutic Outcomes (FOTO)   39% limited      AROM   Cervical Flexion  40   was 40   Cervical Extension  43   was 36   Cervical - Right Side Bend  25   was 15   Cervical - Left Side Bend  20   was 17   Cervical - Right Rotation  69   was 68   Cervical - Left Rotation  63   was 62     Palpation   Palpation comment  max restrictions in bil upper trap, levator scap, SCM, scalenes, cervical paraspinals, and suboccipitals; palpation  to levator scap, SCM, and paraspinals recreated same pain but palpation throughout all mm groups tender                   [facility] Adult PT Treatment/Exercise - 09/06/18 0001      Exercises   Exercises  Neck      Neck Exercises: Seated   Postural Training  sitting tall x 5 reps       Neck Exercises: Supine   Neck Retraction  10 reps    Neck Retraction Limitations  10'' with verbal cues and demonstration and tactile cues    Other Supine Exercise  scapular retraction x 10       Manual Therapy   Manual Therapy  Soft tissue mobilization;Manual Traction    Manual therapy comments  done seperate from all other activities of therapy     Soft tissue mobilization  patient seated. To bilateral cervical paraspinals. to decrease spasm and pain     Manual Traction  15'' Hold x 5 to cervical spine             PT Education - 09/06/18 1141    Education Details  Discussed continuation of care, patient consenting to in-clinic visits.     Person(s) Educated  Patient    Methods  Explanation    Comprehension  Verbalized understanding       PT Short Term Goals - 09/06/18 1143      PT SHORT TERM GOAL #1   Title  Pt will have improved cervical AROM by 5deg throughout all in order to demo reduced restrictions and improve his overall function.     Baseline  09/06/18: Achieved in some, but not all planes of movement see objective measures    Time  2    Period  Weeks    Status  Partially Met    Target Date  07/12/18      PT SHORT TERM GOAL #2   Title  Pt will report being able to participate in his regular workout routine with 3/10 neck pain or less afterwards to demo improved overall strength.    Baseline  09/06/18: Patient has not been able to return to his regular workout  Time  2    Period  Weeks    Status  On-going        PT Long Term Goals - 09/06/18 1144      PT LONG TERM GOAL #1   Title  Pt will have improved cervical AROM by 10deg throughout flexion, extension, and  bil rotation in order to further reduce pain and maximize his [facility] duties with greater ease.     Baseline  09/06/18: See objective measures    Time  4    Period  Weeks    Status  On-going      PT LONG TERM GOAL #2   Title  Pt will report being able to perform yardwork with greater ease and with less neck pain following to demo improved strength and overall function at home.     Baseline  09/06/18: patient reporting that it has improved, but still has pain following yardwork    Time  4    Period  Weeks    Status  On-going      PT LONG TERM GOAL #3   Title  Pt will have reduced soft tissue restrictions to moderate throughout cervical mm in order to reduce overall pain and maximize ROM.     Baseline  09/06/18: Patient with continued soft tissue restrictions    Time  4    Period  Weeks    Status  On-going            Plan - 09/06/18 1158    Clinical Impression Statement  Patient returned to therapy for the first treatment session since 06/30/18 (over 2 months break), due to clinic closure because of Covid-19. Performed re-assessment with indications of some improvement in patient's ROM and patient reporting some improvements in functional mobility, however, patient still with reported pain muscular restrictions and ROM restrictions. Discussed with patient plans to continue with therapy for an additional 4 weeks twice a week for in-clinic visits with patient made aware of his risks related to Covid-19 and patient consented to in-clinic visits. This session continued to focus on postural strengthening with patient requiring frequent verbal and tactile cues for proper form. Ended session with manual to reduce muscular restrictions and pain.     Personal Factors and Comorbidities  Age;Comorbidity 3+    Examination-Activity Limitations  Lift    Examination-Participation Restrictions  Yard Work    Stability/Clinical Decision Making  Stable/Uncomplicated    Rehab Potential  Good    PT Frequency  2x /  week    PT Duration  4 weeks    PT Treatment/Interventions  ADLs/Self Care Home Management;Aquatic Therapy;Cryotherapy;Electrical Stimulation;Moist Heat;Traction;Functional mobility training;Therapeutic activities;Ultrasound;Therapeutic exercise;Balance training;Neuromuscular re-education;Patient/family education;Orthotic Fit/Training;Manual techniques;Scar mobilization;Passive range of motion;Dry needling;Taping    PT Next Visit Plan   stretching, pec stretching, and other postural training/strengthening, cervical and thoraic mobility work, manual for soft tissue restrictions and suboccipistal release    PT Home Exercise Plan  eval: seated scap retraction    Consulted and Agree with Plan of Care  Patient       Patient will benefit from skilled therapeutic intervention in order to improve the following deficits and impairments:  Decreased activity tolerance, Decreased mobility, Decreased range of motion, Decreased strength, Hypomobility, Increased fascial restricitons, Increased muscle spasms, Impaired flexibility, Improper body mechanics, Postural dysfunction, Pain  Visit Diagnosis: Cervicalgia  Abnormal posture  Other symptoms and signs involving the musculoskeletal system     Problem List Patient Active Problem List   Diagnosis Date Noted  .  Intra-abdominal abscess ([place])   . Abscess of sigmoid colon due to diverticulitis   . Acute diverticulitis of intestine 12/27/2016  . Bowel perforation ([place])   . Perforation and abscess of large intestine concurrent with and due to diverticulitis 12/15/2016  . Blood in stool   . Second degree hemorrhoids   . Diverticulosis of large intestine without diverticulitis   . Essential hypertension, benign 02/03/2016  . Cervical spondylosis without myelopathy 01/22/2016  . Nocturnal leg cramps 07/24/2015  . Nontoxic multinodular goiter 01/31/2015  . Elevated transaminase level 10/26/2014  . Thrombocytopenia ([place]) 10/25/2014  . Myasthenia ([place])  02/14/2013  . Convulsions/seizures ([place]) 02/14/2013   Clarene Critchley PT, DPT 12:00 PM, 09/06/18 [facility] [address] [place], [place], [address] Phone: [phone]   Fax:  [phone]  Name: Kyle [name]. MRN: [id number] Date of Birth: [birth year]

## 2018-09-13 ENCOUNTER — Encounter (HOSPITAL_COMMUNITY): Payer: Self-pay

## 2018-09-13 ENCOUNTER — Other Ambulatory Visit: Payer: Self-pay

## 2018-09-13 ENCOUNTER — Ambulatory Visit (HOSPITAL_COMMUNITY): Payer: Medicare Other | Attending: Orthopedic Surgery

## 2018-09-13 DIAGNOSIS — R29898 Other symptoms and signs involving the musculoskeletal system: Secondary | ICD-10-CM | POA: Diagnosis not present

## 2018-09-13 DIAGNOSIS — R293 Abnormal posture: Secondary | ICD-10-CM | POA: Insufficient documentation

## 2018-09-13 DIAGNOSIS — M542 Cervicalgia: Secondary | ICD-10-CM | POA: Insufficient documentation

## 2018-09-13 NOTE — Therapy (Signed)
[place], [place], [address] Phone: [phone]   Fax:  [phone]  Physical Therapy Treatment  Patient Details  Name: Kyle [name]. MRN: [id number] Date of Birth: [birth year] Referring Provider (PT): Melina Schools, MD   Encounter Date: 09/13/2018  PT End of Session - 09/13/18 1201    Visit Number  4    Number of Visits  16    Date for PT Re-Evaluation  10/04/18    Authorization Type  [employer] Medicare    Authorization Time Period  06/28/18 to 07/26/18    Authorization - Visit Number  2    Authorization - Number of Visits  10    PT Start Time  0092    PT Stop Time  3300    PT Time Calculation (min)  42 min    Activity Tolerance  Patient tolerated treatment well;No increased pain    Behavior During Therapy  WFL for tasks assessed/performed       Past Medical History:  Diagnosis Date  . Arthritis   . Auto immune neutropenia (HCC)   . Bullous pemphigoid   . Cancer [facility])    Prostate  . Cervical spondylosis without myelopathy 01/22/2016  . DVT (deep venous thrombosis) ([place])    a. Has had a previous right lower extremity DVT in the setting of hospitalization and surgery (after his brain tumor was resected in 1993) but is no longer on Coumadin.  Marland Kitchen Dyslipidemia   . History of prostate cancer   . Hypertension   . Meningioma ([place])    a. s/p resection 1990s.  . Myasthenia ([place]) 02/14/2013  . Nocturnal leg cramps   . Ocular myasthenia gravis ([place])   . Seizures ([place])   . Sinus bradycardia   . Sleep paralysis   . Thyroid nodule, cold     Past Surgical History:  Procedure Laterality Date  . BRAIN SURGERY    . CHOLECYSTECTOMY    . COLONOSCOPY N/A 07/25/2013   Procedure: COLONOSCOPY;  Surgeon: Jamesetta So, MD;  [facility];  Service: Gastroenterology;  Laterality: N/A;  . COLONOSCOPY N/A 11/10/2016   Procedure: COLONOSCOPY;  Surgeon: Aviva Signs, MD;  [facility];  Service:  Gastroenterology;  Laterality: N/A;  . IR CATHETER TUBE CHANGE  01/02/2017  . IR RADIOLOGIST EVAL & MGMT  01/06/2017  . IR RADIOLOGIST EVAL & MGMT  01/20/2017  . IR RADIOLOGIST EVAL & MGMT  02/03/2017  . IR RADIOLOGIST EVAL & MGMT  02/17/2017  . IR SINUS/FIST TUBE CHK-NON GI  03/03/2017  . RADIOACTIVE SEED IMPLANT      There were no vitals filed for this visit.  Subjective Assessment - 09/13/18 1118    Subjective  Pt states that his neck is doing alright this morning. He used his tiller yesterday in the garden and he states his neck was sore last night from that.     Limitations  Lifting;House hold activities    Diagnostic tests  x-rays    Patient Stated Goals  some degree of easing the pain    Currently in Pain?  Yes    Pain Score  3     Pain Location  Neck    Pain Orientation  Right;Left    Pain Descriptors / Indicators  Aching    Pain Type  Chronic pain    Pain Onset  1 to 4 weeks ago    Pain Frequency  Intermittent    Aggravating Factors  moving    Pain Relieving Factors  keeping still    Effect of Pain on Daily Activities  does them just has increased pain              [facility] Adult PT Treatment/Exercise - 09/13/18 0001      Neck Exercises: Theraband   Scapula Retraction  10 reps;Red    Scapula Retraction Limitations  2 sets    Shoulder Extension  10 reps;Red    Shoulder Extension Limitations  2 sets      Neck Exercises: Seated   Neck Retraction  10 reps    Neck Retraction Limitations  mod cueing for form -- pt did best with cue to give self double chin    Other Seated Exercise  thoracic rotation with bolster and PVC pipe 10x2-3" holds each (cued to turn head opposite of shoulders to reduce neck pain with exercise)      Neck Exercises: Supine   Other Supine Exercise  UE flex OH x15reps (cues for cervical retraction and neutral lumbar spine) to promote thoracic extension      Manual Therapy   Manual Therapy  Soft tissue mobilization    Manual therapy comments   done seperate from all other activities of therapy     Soft tissue mobilization  STM to upper trap, levator scap, and cervical paraspinals with pt in prone to reduce restrictions and pain      Neck Exercises: Stretches   Upper Trapezius Stretch  Right;Left;2 reps;30 seconds    Levator Stretch  Right;Left;2 reps;30 seconds           PT Education - 09/13/18 1203    Education Details  exercise technique, added chin tucks to HEP    Person(s) Educated  Patient    Methods  Explanation;Demonstration;Tactile cues;Verbal cues    Comprehension  Verbalized understanding;Returned demonstration       PT Short Term Goals - 09/06/18 1143      PT SHORT TERM GOAL #1   Title  Pt will have improved cervical AROM by 5deg throughout all in order to demo reduced restrictions and improve his overall function.     Baseline  09/06/18: Achieved in some, but not all planes of movement see objective measures    Time  2    Period  Weeks    Status  Partially Met    Target Date  07/12/18      PT SHORT TERM GOAL #2   Title  Pt will report being able to participate in his regular workout routine with 3/10 neck pain or less afterwards to demo improved overall strength.    Baseline  09/06/18: Patient has not been able to return to his regular workout    Time  2    Period  Weeks    Status  On-going        PT Long Term Goals - 09/06/18 1144      PT LONG TERM GOAL #1   Title  Pt will have improved cervical AROM by 10deg throughout flexion, extension, and bil rotation in order to further reduce pain and maximize his [facility] duties with greater ease.     Baseline  09/06/18: See objective measures    Time  4    Period  Weeks    Status  On-going      PT LONG TERM GOAL #2   Title  Pt will report being able to perform yardwork with greater ease and with less neck pain following to  demo improved strength and overall function at home.     Baseline  09/06/18: patient reporting that it has improved, but still has pain  following yardwork    Time  4    Period  Weeks    Status  On-going      PT LONG TERM GOAL #3   Title  Pt will have reduced soft tissue restrictions to moderate throughout cervical mm in order to reduce overall pain and maximize ROM.     Baseline  09/06/18: Patient with continued soft tissue restrictions    Time  4    Period  Weeks    Status  On-going            Plan - 09/13/18 1202    Clinical Impression Statement  Continued with established POC focusing on improving overall mobility of cervical and thoracic spine, postural strength, and reducing restrictions. Performed cervical retractions in sitting this date and pt did best with cue to give himself a double chin; retractions still limited due to restrictions throughout cervical spine and thoracic spine. Added supine UE flexion OH to promote thoracic extension. Ended with manual STM to reduce restrictions in surrounding cervical mm. Cued pt to practice giving himself double chin when he is driving or riding his lawn mower and he verbalized understanding.     Personal Factors and Comorbidities  Age;Comorbidity 3+    Examination-Activity Limitations  Lift    Examination-Participation Restrictions  Yard Work    Stability/Clinical Decision Making  Stable/Uncomplicated    Rehab Potential  Good    PT Frequency  2x / week    PT Duration  4 weeks    PT Treatment/Interventions  ADLs/Self Care Home Management;Aquatic Therapy;Cryotherapy;Electrical Stimulation;Moist Heat;Traction;Functional mobility training;Therapeutic activities;Ultrasound;Therapeutic exercise;Balance training;Neuromuscular re-education;Patient/family education;Orthotic Fit/Training;Manual techniques;Scar mobilization;Passive range of motion;Dry needling;Taping    PT Next Visit Plan  continue stretching, pec stretching, and other postural training/strengthening, cervical and thoraic mobility work, manual for soft tissue restrictions and suboccipistal release    PT Home Exercise  Plan  eval: seated scap retraction; 6/2: chin tucks (giving self double chin) in sitting    Consulted and Agree with Plan of Care  Patient       Patient will benefit from skilled therapeutic intervention in order to improve the following deficits and impairments:  Decreased activity tolerance, Decreased mobility, Decreased range of motion, Decreased strength, Hypomobility, Increased fascial restricitons, Increased muscle spasms, Impaired flexibility, Improper body mechanics, Postural dysfunction, Pain  Visit Diagnosis: Cervicalgia  Abnormal posture  Other symptoms and signs involving the musculoskeletal system     Problem List Patient Active Problem List   Diagnosis Date Noted  . Intra-abdominal abscess ([place])   . Abscess of sigmoid colon due to diverticulitis   . Acute diverticulitis of intestine 12/27/2016  . Bowel perforation ([place])   . Perforation and abscess of large intestine concurrent with and due to diverticulitis 12/15/2016  . Blood in stool   . Second degree hemorrhoids   . Diverticulosis of large intestine without diverticulitis   . Essential hypertension, benign 02/03/2016  . Cervical spondylosis without myelopathy 01/22/2016  . Nocturnal leg cramps 07/24/2015  . Nontoxic multinodular goiter 01/31/2015  . Elevated transaminase level 10/26/2014  . Thrombocytopenia ([place]) 10/25/2014  . Myasthenia ([place]) 02/14/2013  . Convulsions/seizures ([place]) 02/14/2013        Geraldine Solar PT, [place] [address] [place], [place], [address] Phone: [phone]   Fax:  [phone]  Name: Kyle [name]. MRN: [id number] Date of Birth: 09-Oct-1934

## 2018-09-15 ENCOUNTER — Encounter (HOSPITAL_COMMUNITY): Payer: Self-pay

## 2018-09-15 ENCOUNTER — Ambulatory Visit (HOSPITAL_COMMUNITY): Payer: Medicare Other

## 2018-09-15 ENCOUNTER — Other Ambulatory Visit: Payer: Self-pay

## 2018-09-15 DIAGNOSIS — R293 Abnormal posture: Secondary | ICD-10-CM

## 2018-09-15 DIAGNOSIS — M542 Cervicalgia: Secondary | ICD-10-CM | POA: Diagnosis not present

## 2018-09-15 DIAGNOSIS — R29898 Other symptoms and signs involving the musculoskeletal system: Secondary | ICD-10-CM | POA: Diagnosis not present

## 2018-09-15 NOTE — Therapy (Signed)
[place], [place], [address] Phone: [phone]   Fax:  [phone]  Physical Therapy Treatment  Patient Details  Name: Kyle [name]. MRN: [id number] Date of Birth: 1934/06/18 Referring Provider (PT): Melina Schools, MD   Encounter Date: 09/15/2018  PT End of Session - 09/15/18 0910    Visit Number  5    Number of Visits  16    Date for PT Re-Evaluation  10/04/18    Authorization Type  UHC Medicare    Authorization Time Period  06/28/18 to 07/26/18    Authorization - Visit Number  3    Authorization - Number of Visits  10    PT Start Time  0910    PT Stop Time  0951    PT Time Calculation (min)  41 min    Activity Tolerance  Patient tolerated treatment well;No increased pain    Behavior During Therapy  WFL for tasks assessed/performed       Past Medical History:  Diagnosis Date  . Arthritis   . Auto immune neutropenia (HCC)   . Bullous pemphigoid   . Cancer [facility])    Prostate  . Cervical spondylosis without myelopathy 01/22/2016  . DVT (deep venous thrombosis) ([place])    a. Has had a previous right lower extremity DVT in the setting of hospitalization and surgery (after his brain tumor was resected in 1993) but is no longer on Coumadin.  Marland Kitchen Dyslipidemia   . History of prostate cancer   . Hypertension   . Meningioma ([place])    a. s/p resection 1990s.  . Myasthenia ([place]) 02/14/2013  . Nocturnal leg cramps   . Ocular myasthenia gravis ([place])   . Seizures ([place])   . Sinus bradycardia   . Sleep paralysis   . Thyroid nodule, cold     Past Surgical History:  Procedure Laterality Date  . BRAIN SURGERY    . CHOLECYSTECTOMY    . COLONOSCOPY N/A 07/25/2013   Procedure: COLONOSCOPY;  Surgeon: Jamesetta So, MD;  [facility];  Service: Gastroenterology;  Laterality: N/A;  . COLONOSCOPY N/A 11/10/2016   Procedure: COLONOSCOPY;  Surgeon: Aviva Signs, MD;  [facility];  Service:  Gastroenterology;  Laterality: N/A;  . IR CATHETER TUBE CHANGE  01/02/2017  . IR RADIOLOGIST EVAL & MGMT  01/06/2017  . IR RADIOLOGIST EVAL & MGMT  01/20/2017  . IR RADIOLOGIST EVAL & MGMT  02/03/2017  . IR RADIOLOGIST EVAL & MGMT  02/17/2017  . IR SINUS/FIST TUBE CHK-NON GI  03/03/2017  . RADIOACTIVE SEED IMPLANT      There were no vitals filed for this visit.  Subjective Assessment - 09/15/18 0910    Subjective  Pt stating that his neck is bothering him a little more on the R side today than the L. Hurts when he turns L.     Limitations  Lifting;House hold activities    Diagnostic tests  x-rays    Patient Stated Goals  some degree of easing the pain    Currently in Pain?  Yes    Pain Score  4     Pain Location  Neck    Pain Orientation  Right    Pain Descriptors / Indicators  Aching    Pain Type  Chronic pain    Pain Onset  1 to 4 weeks ago    Pain Frequency  Intermittent    Aggravating Factors   moving  Pain Relieving Factors  keeping still    Effect of Pain on Daily Activities  does them just has increased pain             [facility] Adult PT Treatment/Exercise - 09/15/18 0001      Neck Exercises: Theraband   Scapula Retraction  10 reps;Red    Scapula Retraction Limitations  2 sets    Shoulder Extension  10 reps;Red    Shoulder Extension Limitations  2 sets    Horizontal ABduction  10 reps;Red    Horizontal ABduction Limitations  band pulls, 3sets    Other Theraband Exercises  scap retraction +ER RTB 3x10 reps RTB    Other Theraband Exercises  lower trap strength RTB (anchored high, elbows pull down by side) 3x10 reps      Neck Exercises: Seated   Neck Retraction  20 reps    Neck Retraction Limitations  mod cueing for form -- pt did best with cue to give self double chin    Other Seated Exercise  thoracic rotation with bolster and PVC pipe 10x2-3" holds each (cued to turn head opposite of shoulders to reduce neck pain with exercise)      Manual Therapy   Manual  Therapy  Soft tissue mobilization    Manual therapy comments  done seperate from all other activities of therapy     Soft tissue mobilization  STM to upper trap, levator scap, cervical paraspinals, and SCM with pt in supine to reduce restrictions and pain      Neck Exercises: Stretches   Upper Trapezius Stretch  Right;Left;2 reps;30 seconds    Levator Stretch  Right;Left;2 reps;30 seconds             PT Education - 09/15/18 0910    Education Details  exercise technique, continue HEP    Person(s) Educated  Patient    Methods  Explanation;Demonstration;Tactile cues;Verbal cues    Comprehension  Verbalized understanding;Returned demonstration       PT Short Term Goals - 09/06/18 1143      PT SHORT TERM GOAL #1   Title  Pt will have improved cervical AROM by 5deg throughout all in order to demo reduced restrictions and improve his overall function.     Baseline  09/06/18: Achieved in some, but not all planes of movement see objective measures    Time  2    Period  Weeks    Status  Partially Met    Target Date  07/12/18      PT SHORT TERM GOAL #2   Title  Pt will report being able to participate in his regular workout routine with 3/10 neck pain or less afterwards to demo improved overall strength.    Baseline  09/06/18: Patient has not been able to return to his regular workout    Time  2    Period  Weeks    Status  On-going        PT Long Term Goals - 09/06/18 1144      PT LONG TERM GOAL #1   Title  Pt will have improved cervical AROM by 10deg throughout flexion, extension, and bil rotation in order to further reduce pain and maximize his [facility] duties with greater ease.     Baseline  09/06/18: See objective measures    Time  4    Period  Weeks    Status  On-going      PT LONG TERM GOAL #2   Title  Pt  will report being able to perform yardwork with greater ease and with less neck pain following to demo improved strength and overall function at home.     Baseline  09/06/18:  patient reporting that it has improved, but still has pain following yardwork    Time  4    Period  Weeks    Status  On-going      PT LONG TERM GOAL #3   Title  Pt will have reduced soft tissue restrictions to moderate throughout cervical mm in order to reduce overall pain and maximize ROM.     Baseline  09/06/18: Patient with continued soft tissue restrictions    Time  4    Period  Weeks    Status  On-going            Plan - 09/15/18 7341    Clinical Impression Statement  Continued with established POC focusing on improving cervical and overall posture, postural strength, and reducing soft tissue restrictions. Continued with require cues for proper form throughout therex. He is still using shoulder and thoracic spine/mm to compensate for cervical retractions. Continued with theraband strengthening and thoracic mobility. Ended with manual for STM to reduce soft tissue restrictions, adding in STM to [facility] as he was pinpointing pain at his mastoid process. Pt reported slightly reduced pain at EOS.     Personal Factors and Comorbidities  Age;Comorbidity 3+    Examination-Activity Limitations  Lift    Examination-Participation Restrictions  Yard Work    Stability/Clinical Decision Making  Stable/Uncomplicated    Rehab Potential  Good    PT Frequency  2x / week    PT Duration  4 weeks    PT Treatment/Interventions  ADLs/Self Care Home Management;Aquatic Therapy;Cryotherapy;Electrical Stimulation;Moist Heat;Traction;Functional mobility training;Therapeutic activities;Ultrasound;Therapeutic exercise;Balance training;Neuromuscular re-education;Patient/family education;Orthotic Fit/Training;Manual techniques;Scar mobilization;Passive range of motion;Dry needling;Taping    PT Next Visit Plan  continue stretching, pec stretching, and other postural training/strengthening, cervical and thoraic mobility work, manual for soft tissue restrictions and suboccipistal release    PT Home Exercise Plan  eval:  seated scap retraction; 6/2: chin tucks (giving self double chin) in sitting    Consulted and Agree with Plan of Care  Patient       Patient will benefit from skilled therapeutic intervention in order to improve the following deficits and impairments:  Decreased activity tolerance, Decreased mobility, Decreased range of motion, Decreased strength, Hypomobility, Increased fascial restricitons, Increased muscle spasms, Impaired flexibility, Improper body mechanics, Postural dysfunction, Pain  Visit Diagnosis: Cervicalgia  Abnormal posture  Other symptoms and signs involving the musculoskeletal system     Problem List Patient Active Problem List   Diagnosis Date Noted  . Intra-abdominal abscess ([place])   . Abscess of sigmoid colon due to diverticulitis   . Acute diverticulitis of intestine 12/27/2016  . Bowel perforation ([place])   . Perforation and abscess of large intestine concurrent with and due to diverticulitis 12/15/2016  . Blood in stool   . Second degree hemorrhoids   . Diverticulosis of large intestine without diverticulitis   . Essential hypertension, benign 02/03/2016  . Cervical spondylosis without myelopathy 01/22/2016  . Nocturnal leg cramps 07/24/2015  . Nontoxic multinodular goiter 01/31/2015  . Elevated transaminase level 10/26/2014  . Thrombocytopenia ([place]) 10/25/2014  . Myasthenia ([place]) 02/14/2013  . Convulsions/seizures ([place]) 02/14/2013       Geraldine Solar PT, [place] [address] [place], [place], [address] Phone: [phone]   Fax:  [phone]  Name: Kyle [name]. MRN: [id number] Date of Birth: 1934/08/21

## 2018-09-17 IMAGING — CT CT IMAGE GUIDED DRAINAGE BY PERCUTANEOUS CATHETER
3 of 6 series · 14 of 32 positions shown, 19 images · non-contrast
Comparison: none

INDICATION: 82-year-old with a diverticular abscess. Plan for CT-guided drain
placement.
TECHNIQUE: Informed written consent was obtained from the patient after a
thorough discussion of the procedural risks, benefits and
alternatives. All questions were addressed. A timeout was performed
prior to the initiation of the procedure.

[Series 2: i-spiral 5.0 b40f · axial · 0.87mm/px · z∈[+406,+546]mm · 6 of 56 slices shown, 11 images (1 of 3)]
[im 8/56  soft-tissue]
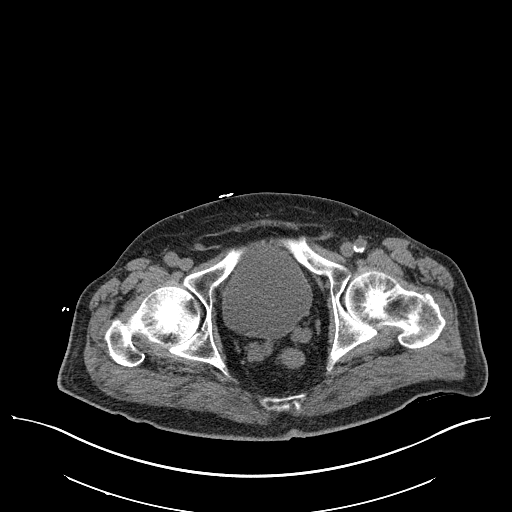
[im 8/56  bone]
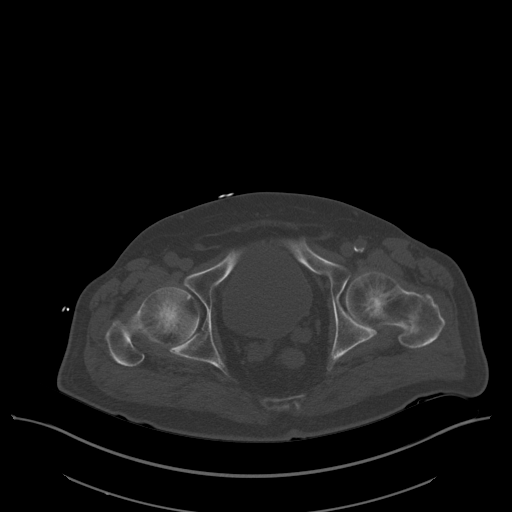
[im 16/56  soft-tissue]
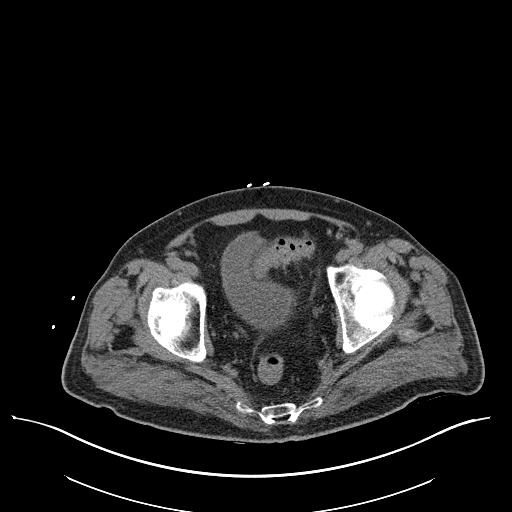
[im 24/56  soft-tissue]
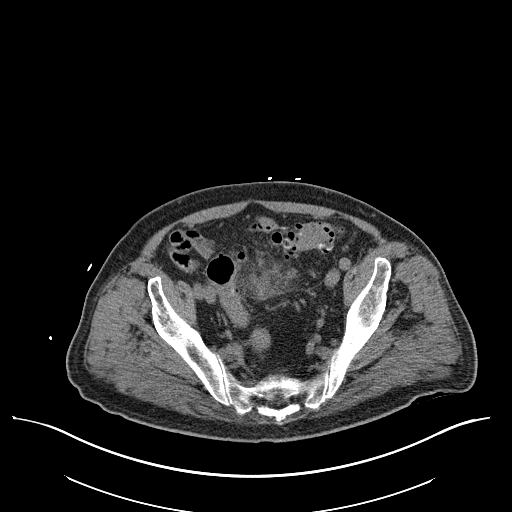
[im 24/56  lung]
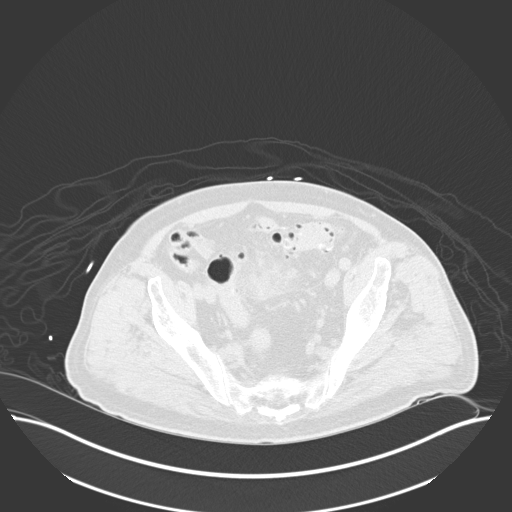
[im 32/56  soft-tissue]
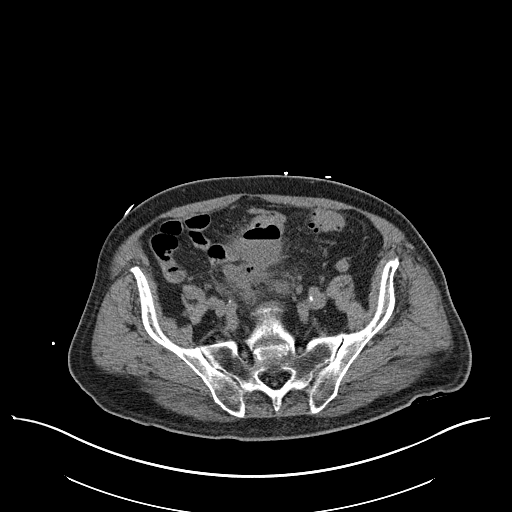
[im 32/56  lung]
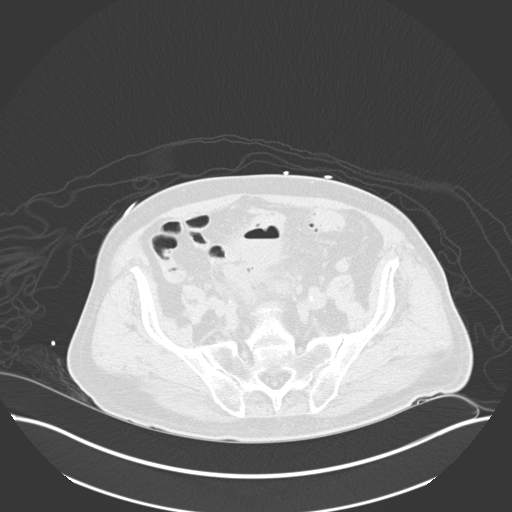
[im 40/56  soft-tissue]
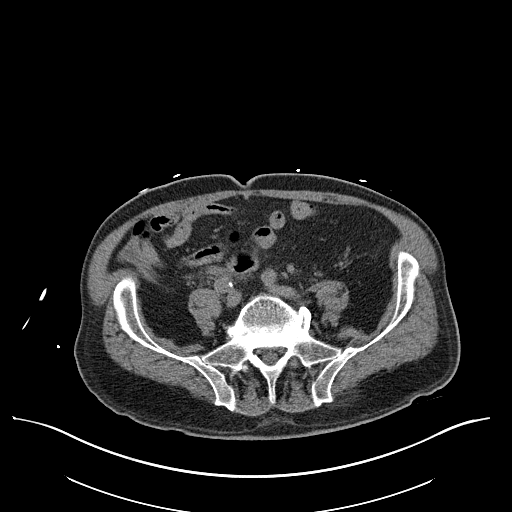
[im 40/56  lung]
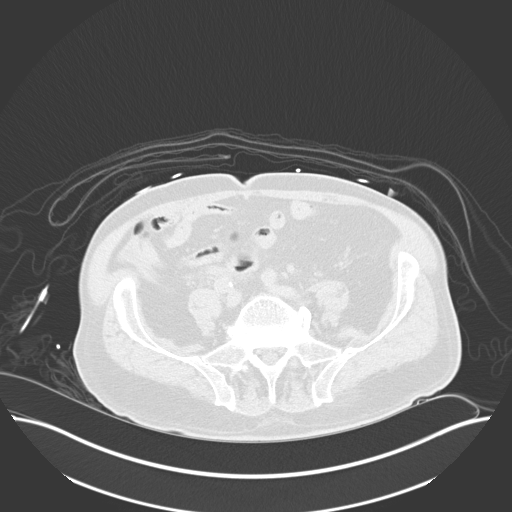
[im 48/56  soft-tissue]
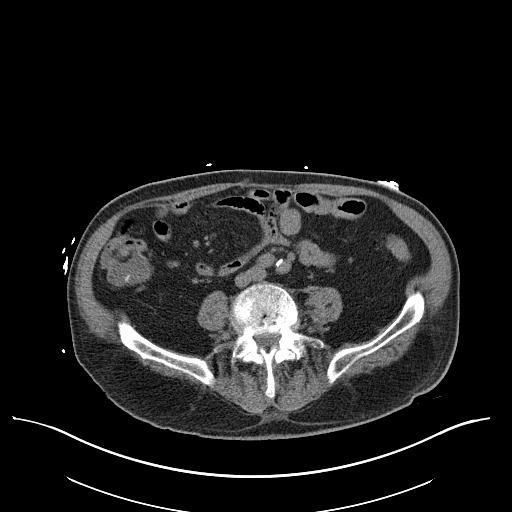
[im 48/56  lung]
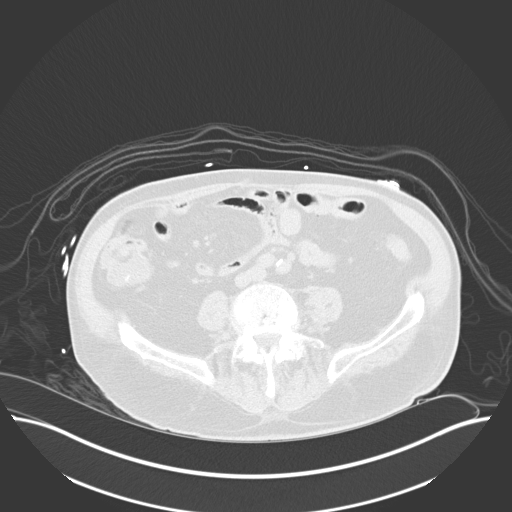

[Series 4: i-spiral 5.0 b40f · axial · 0.87mm/px · z∈[+416,+451]mm · 2 of 31 slices shown (2 of 3)]
[im 11/31  soft-tissue]
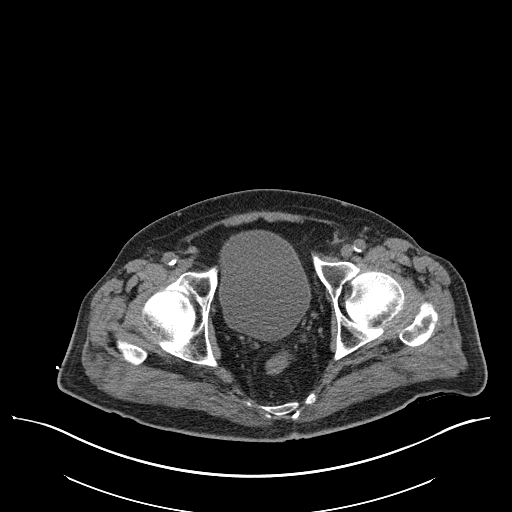
[im 21/31  soft-tissue]
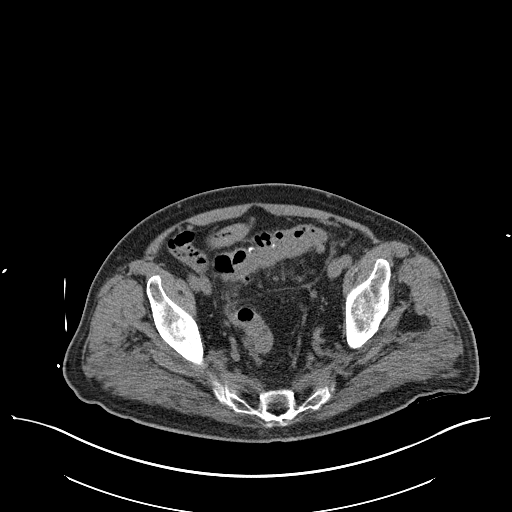

[Series 5: i-spiral 5.0 b40f · axial · 0.87mm/px · z∈[+409,+542]mm · 6 of 54 slices shown (3 of 3)]
[im 8/54  soft-tissue]
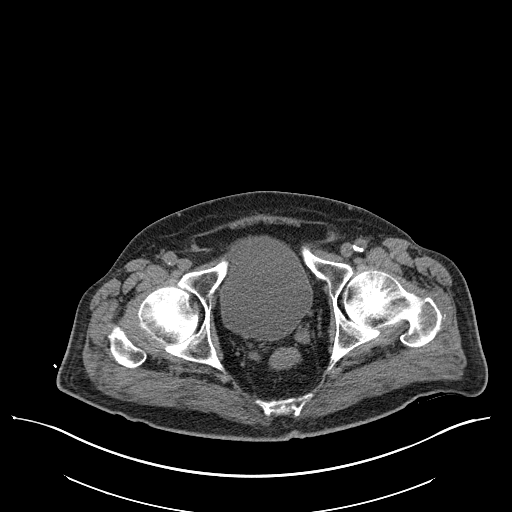
[im 16/54  soft-tissue]
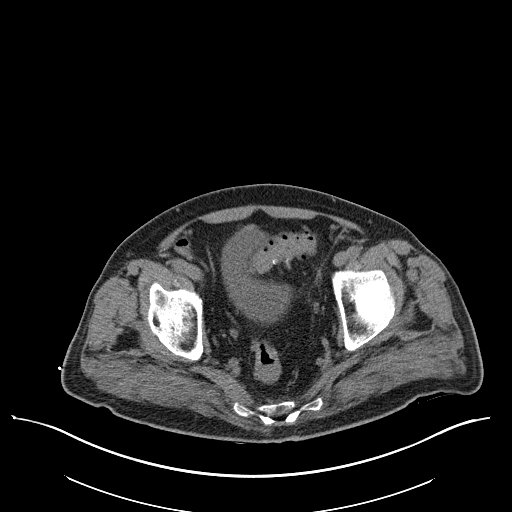
[im 23/54  soft-tissue]
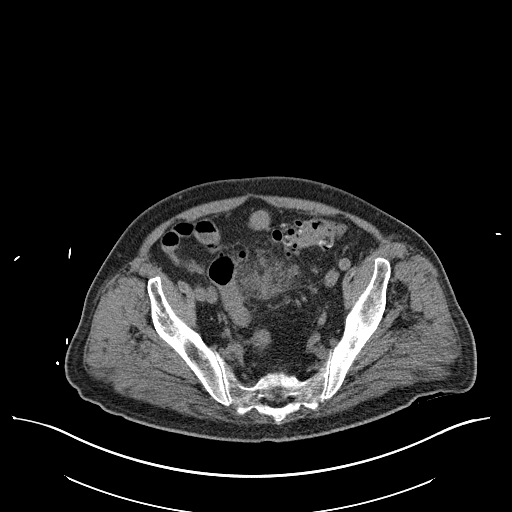
[im 31/54  soft-tissue]
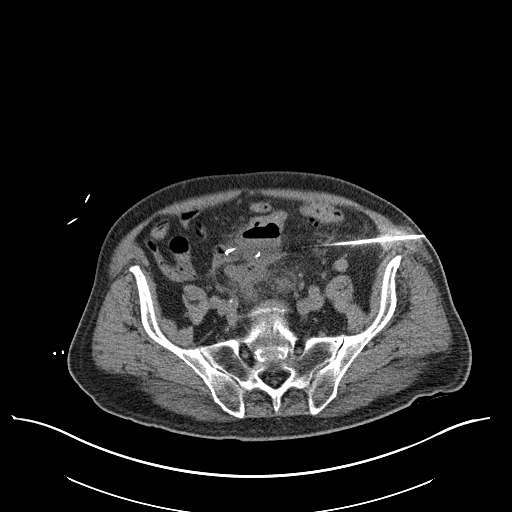
[im 38/54  soft-tissue]
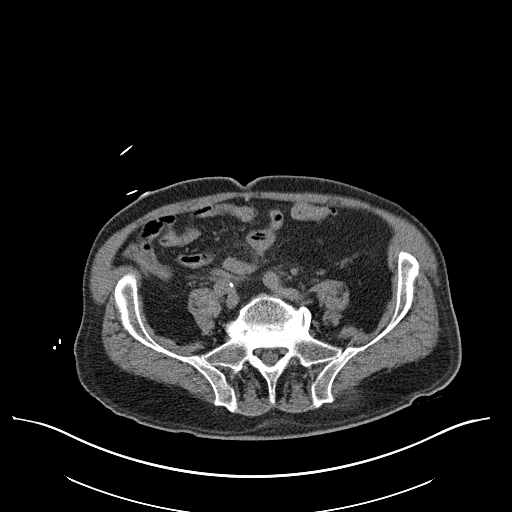
[im 46/54  soft-tissue]
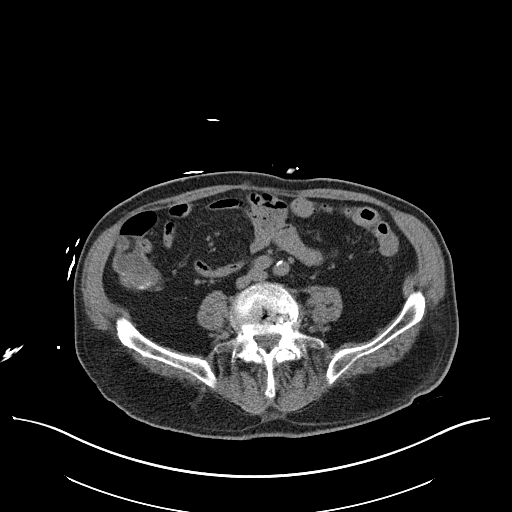

[14 of 32 positions shown; findings below may reference images not displayed]

EXAM:
CT GUIDED DRAINAGE OF DIVERTICULAR ABSCESS

MEDICATIONS:
Sedation medications

ANESTHESIA/SEDATION:
3.0 mg IV Versed 100 mcg IV Fentanyl

Moderate Sedation Time:  26 minutes

The patient was continuously monitored during the procedure by the
interventional radiology nurse under my direct supervision.

COMPLICATIONS:
None immediate.
PROCEDURE:
CT images obtained in the lower abdomen and pelvis with the patient
supine. The left side of the abdomen was prepped and draped in
sterile fashion. Skin was anesthetized with 1% lidocaine. Using CT
guidance, an 18 gauge trocar needle was directed from the lateral
left abdomen into the midline abscess collection. Yellow purulent
fluid was aspirated from the collection. Stiff Amplatz wire was
placed. Tract was dilated to accommodate a 10 French multipurpose
drain. 30 mL of yellow purulent fluid was removed. Catheter was
attached to a suction bulb.
FINDINGS: Air-fluid collection in the midline of the lower abdomen. Multiple
adjacent small pockets of extraluminal gas just caudal and posterior
to the larger air-fluid collection. Drain was successfully placed
within the largest air-fluid collection. 30 mL of purulent fluid was
removed. The largest air-fluid collection was decompressed at the
end of the procedure.
IMPRESSION: CT-guided placement of a drainage catheter within the lower
abdominal diverticular abscess.

## 2018-09-20 ENCOUNTER — Other Ambulatory Visit: Payer: Self-pay

## 2018-09-20 ENCOUNTER — Ambulatory Visit (HOSPITAL_COMMUNITY): Payer: Medicare Other

## 2018-09-20 ENCOUNTER — Encounter (HOSPITAL_COMMUNITY): Payer: Self-pay

## 2018-09-20 DIAGNOSIS — R29898 Other symptoms and signs involving the musculoskeletal system: Secondary | ICD-10-CM

## 2018-09-20 DIAGNOSIS — M542 Cervicalgia: Secondary | ICD-10-CM

## 2018-09-20 DIAGNOSIS — R293 Abnormal posture: Secondary | ICD-10-CM

## 2018-09-20 NOTE — Patient Instructions (Signed)

## 2018-09-20 NOTE — Therapy (Signed)
[place], [place], [address] Phone: [phone]   Fax:  [phone]  Physical Therapy Treatment  Patient Details  Name: Kyle [name]. MRN: [id number] Date of Birth: 1934/05/23 Referring Provider (PT): Melina Schools, MD   Encounter Date: 09/20/2018  PT End of Session - 09/20/18 1116    Visit Number  6    Number of Visits  16    Date for PT Re-Evaluation  10/04/18    Authorization Type  [employer] Medicare    Authorization Time Period  06/28/18 to 07/26/18; NEW 09/06/18 to 10/04/18    Authorization - Visit Number  4    Authorization - Number of Visits  10    PT Start Time  1113    PT Stop Time  3419    PT Time Calculation (min)  45 min    Activity Tolerance  Patient tolerated treatment well;No increased pain    Behavior During Therapy  WFL for tasks assessed/performed       Past Medical History:  Diagnosis Date  . Arthritis   . Auto immune neutropenia (HCC)   . Bullous pemphigoid   . Cancer [facility])    Prostate  . Cervical spondylosis without myelopathy 01/22/2016  . DVT (deep venous thrombosis) ([place])    a. Has had a previous right lower extremity DVT in the setting of hospitalization and surgery (after his brain tumor was resected in 1993) but is no longer on Coumadin.  Marland Kitchen Dyslipidemia   . History of prostate cancer   . Hypertension   . Meningioma ([place])    a. s/p resection 1990s.  . Myasthenia ([place]) 02/14/2013  . Nocturnal leg cramps   . Ocular myasthenia gravis ([place])   . Seizures ([place])   . Sinus bradycardia   . Sleep paralysis   . Thyroid nodule, cold     Past Surgical History:  Procedure Laterality Date  . BRAIN SURGERY    . CHOLECYSTECTOMY    . COLONOSCOPY N/A 07/25/2013   Procedure: COLONOSCOPY;  Surgeon: Jamesetta So, MD;  [facility];  Service: Gastroenterology;  Laterality: N/A;  . COLONOSCOPY N/A 11/10/2016   Procedure: COLONOSCOPY;  Surgeon: Aviva Signs, MD;  [facility];  Service: Gastroenterology;  Laterality: N/A;  . IR CATHETER TUBE CHANGE  01/02/2017  . IR RADIOLOGIST EVAL & MGMT  01/06/2017  . IR RADIOLOGIST EVAL & MGMT  01/20/2017  . IR RADIOLOGIST EVAL & MGMT  02/03/2017  . IR RADIOLOGIST EVAL & MGMT  02/17/2017  . IR SINUS/FIST TUBE CHK-NON GI  03/03/2017  . RADIOACTIVE SEED IMPLANT      There were no vitals filed for this visit.  Subjective Assessment - 09/20/18 1117    Subjective  Pt states that he weed eated this morning and now his neck is sore from that. It didn't bother him during it but it is sore now. Does feel like the therapy and manual work is helping.    Limitations  Lifting;House hold activities    Diagnostic tests  x-rays    Patient Stated Goals  some degree of easing the pain    Currently in Pain?  Yes    Pain Score  3     Pain Location  Neck    Pain Onset  1 to 4 weeks ago    Pain Frequency  Intermittent    Aggravating Factors   moving    Pain Relieving Factors  keeping still  Effect of Pain on Daily Activities  does them just has increased pai            [facility] Adult PT Treatment/Exercise - 09/20/18 0001      Neck Exercises: Machines for Strengthening   UBE (Upper Arm Bike)  x3 mins retro for postural strength      Neck Exercises: Theraband   Scapula Retraction  10 reps;Green    Scapula Retraction Limitations  2 sets    Shoulder Extension  10 reps;Green    Shoulder Extension Limitations  2 sets    Horizontal ABduction  10 reps;Green    Horizontal ABduction Limitations  (seated) band pulls, 3sets    Other Theraband Exercises  (seated) scap retraction +ER GTB 3x10 reps    Other Theraband Exercises  lower trap strength GTB (anchored high, elbows pull down by side) 2x10 reps      Neck Exercises: Supine   Neck Retraction  15 reps    Neck Retraction Limitations  3-5" holds, into towel, cues to reduce shoulder/back compensation    Other Supine Exercise  UE flex OH +chin tuck x15 reps (to improve thoracic  extension/cervical spine dissociation from thoracic spine/GH movement)      Neck Exercises: Stretches   Upper Trapezius Stretch  Right;Left;2 reps;30 seconds    Levator Stretch  Right;Left;2 reps;30 seconds    Corner Stretch  2 reps;30 seconds             PT Education - 09/20/18 1116    Education Details  exercise technique, continue HEP    Person(s) Educated  Patient    Methods  Explanation;Demonstration;Tactile cues;Verbal cues    Comprehension  Verbalized understanding;Returned demonstration       PT Short Term Goals - 09/06/18 1143      PT SHORT TERM GOAL #1   Title  Pt will have improved cervical AROM by 5deg throughout all in order to demo reduced restrictions and improve his overall function.     Baseline  09/06/18: Achieved in some, but not all planes of movement see objective measures    Time  2    Period  Weeks    Status  Partially Met    Target Date  07/12/18      PT SHORT TERM GOAL #2   Title  Pt will report being able to participate in his regular workout routine with 3/10 neck pain or less afterwards to demo improved overall strength.    Baseline  09/06/18: Patient has not been able to return to his regular workout    Time  2    Period  Weeks    Status  On-going        PT Long Term Goals - 09/06/18 1144      PT LONG TERM GOAL #1   Title  Pt will have improved cervical AROM by 10deg throughout flexion, extension, and bil rotation in order to further reduce pain and maximize his [facility] duties with greater ease.     Baseline  09/06/18: See objective measures    Time  4    Period  Weeks    Status  On-going      PT LONG TERM GOAL #2   Title  Pt will report being able to perform yardwork with greater ease and with less neck pain following to demo improved strength and overall function at home.     Baseline  09/06/18: patient reporting that it has improved, but still has pain following yardwork  Time  4    Period  Weeks    Status  On-going      PT LONG TERM  GOAL #3   Title  Pt will have reduced soft tissue restrictions to moderate throughout cervical mm in order to reduce overall pain and maximize ROM.     Baseline  09/06/18: Patient with continued soft tissue restrictions    Time  4    Period  Weeks    Status  On-going            Plan - 09/20/18 1206    Clinical Impression Statement  Continued with established POC focusing on improving overall posture, postural strength, and reducing cervical pain. Began UBE and pec stretching for postural strengthening and improved thoracic mobility. Also added UE flexion in supine +chin tuck to facilitate dissociation between cervical spine and thoracic spine/GH complex as he tends to compensate with these due to his lack of cervical ROM. Ended with manual again and feel that his pinpointed pain is actually at proximal upper trap vs SCM as noted last session; pt still with palpable restrictions throughout distal upper trap (between neck and shoulder) and educated pt on benefits/risks of dry needling. Provided handout and will initiate this if pt agrees next visit.     Personal Factors and Comorbidities  Age;Comorbidity 3+    Examination-Activity Limitations  Lift    Examination-Participation Restrictions  Yard Work    Stability/Clinical Decision Making  Stable/Uncomplicated    Rehab Potential  Good    PT Frequency  2x / week    PT Duration  4 weeks    PT Treatment/Interventions  ADLs/Self Care Home Management;Aquatic Therapy;Cryotherapy;Electrical Stimulation;Moist Heat;Traction;Functional mobility training;Therapeutic activities;Ultrasound;Therapeutic exercise;Balance training;Neuromuscular re-education;Patient/family education;Orthotic Fit/Training;Manual techniques;Scar mobilization;Passive range of motion;Dry needling;Taping    PT Next Visit Plan  initiate dry neelding when able. continue stretching, pec stretching, and other postural training/strengthening, cervical and thoraic mobility work, manual for  soft tissue restrictions and suboccipistal release    PT Home Exercise Plan  eval: seated scap retraction; 6/2: chin tucks (giving self double chin) in sitting    Consulted and Agree with Plan of Care  Patient       Patient will benefit from skilled therapeutic intervention in order to improve the following deficits and impairments:  Decreased activity tolerance, Decreased mobility, Decreased range of motion, Decreased strength, Hypomobility, Increased fascial restricitons, Increased muscle spasms, Impaired flexibility, Improper body mechanics, Postural dysfunction, Pain  Visit Diagnosis: Cervicalgia  Abnormal posture  Other symptoms and signs involving the musculoskeletal system     Problem List Patient Active Problem List   Diagnosis Date Noted  . Intra-abdominal abscess ([place])   . Abscess of sigmoid colon due to diverticulitis   . Acute diverticulitis of intestine 12/27/2016  . Bowel perforation ([place])   . Perforation and abscess of large intestine concurrent with and due to diverticulitis 12/15/2016  . Blood in stool   . Second degree hemorrhoids   . Diverticulosis of large intestine without diverticulitis   . Essential hypertension, benign 02/03/2016  . Cervical spondylosis without myelopathy 01/22/2016  . Nocturnal leg cramps 07/24/2015  . Nontoxic multinodular goiter 01/31/2015  . Elevated transaminase level 10/26/2014  . Thrombocytopenia ([place]) 10/25/2014  . Myasthenia ([place]) 02/14/2013  . Convulsions/seizures ([place]) 02/14/2013       Geraldine Solar PT, [place] [address] [place], [place], [address] Phone: [phone]   Fax:  [phone]  Name: Kyle [name].  MRN: [id number] Date of Birth: 20-Jun-1934

## 2018-09-22 ENCOUNTER — Encounter (HOSPITAL_COMMUNITY): Payer: Self-pay | Admitting: Physical Therapy

## 2018-09-22 ENCOUNTER — Ambulatory Visit (HOSPITAL_COMMUNITY): Payer: Medicare Other | Admitting: Physical Therapy

## 2018-09-22 ENCOUNTER — Other Ambulatory Visit: Payer: Self-pay

## 2018-09-22 DIAGNOSIS — M542 Cervicalgia: Secondary | ICD-10-CM | POA: Diagnosis not present

## 2018-09-22 DIAGNOSIS — R29898 Other symptoms and signs involving the musculoskeletal system: Secondary | ICD-10-CM | POA: Diagnosis not present

## 2018-09-22 DIAGNOSIS — R293 Abnormal posture: Secondary | ICD-10-CM

## 2018-09-22 NOTE — Therapy (Signed)
[place], [place], [address] Phone: [phone]   Fax:  [phone]  Physical Therapy Treatment  Patient Details  Name: Kyle [name]. MRN: [id number] Date of Birth: Mar 20, 1935 Referring Provider (PT): Melina Schools, MD   Encounter Date: 09/22/2018  PT End of Session - 09/22/18 0943    Visit Number  7    Number of Visits  16    Date for PT Re-Evaluation  10/04/18    Authorization Type  [employer] Medicare    Authorization Time Period  06/28/18 to 07/26/18; NEW 09/06/18 to 10/04/18    Authorization - Visit Number  5    Authorization - Number of Visits  10    PT Start Time  0923    PT Stop Time  1001    PT Time Calculation (min)  38 min    Activity Tolerance  Patient tolerated treatment well;No increased pain    Behavior During Therapy  WFL for tasks assessed/performed       Past Medical History:  Diagnosis Date  . Arthritis   . Auto immune neutropenia (HCC)   . Bullous pemphigoid   . Cancer [facility])    Prostate  . Cervical spondylosis without myelopathy 01/22/2016  . DVT (deep venous thrombosis) ([place])    a. Has had a previous right lower extremity DVT in the setting of hospitalization and surgery (after his brain tumor was resected in 1993) but is no longer on Coumadin.  Marland Kitchen Dyslipidemia   . History of prostate cancer   . Hypertension   . Meningioma ([place])    a. s/p resection 1990s.  . Myasthenia ([place]) 02/14/2013  . Nocturnal leg cramps   . Ocular myasthenia gravis ([place])   . Seizures ([place])   . Sinus bradycardia   . Sleep paralysis   . Thyroid nodule, cold     Past Surgical History:  Procedure Laterality Date  . BRAIN SURGERY    . CHOLECYSTECTOMY    . COLONOSCOPY N/A 07/25/2013   Procedure: COLONOSCOPY;  Surgeon: Jamesetta So, MD;  [facility];  Service: Gastroenterology;  Laterality: N/A;  . COLONOSCOPY N/A 11/10/2016   Procedure: COLONOSCOPY;  Surgeon: Aviva Signs, MD;  [facility];  Service: Gastroenterology;  Laterality: N/A;  . IR CATHETER TUBE CHANGE  01/02/2017  . IR RADIOLOGIST EVAL & MGMT  01/06/2017  . IR RADIOLOGIST EVAL & MGMT  01/20/2017  . IR RADIOLOGIST EVAL & MGMT  02/03/2017  . IR RADIOLOGIST EVAL & MGMT  02/17/2017  . IR SINUS/FIST TUBE CHK-NON GI  03/03/2017  . RADIOACTIVE SEED IMPLANT      There were no vitals filed for this visit.  Subjective Assessment - 09/22/18 0925    Subjective  Patient reported that his neck pain is about his usual pain which he rates as a 3/10.    Limitations  Lifting;House hold activities    Diagnostic tests  x-rays    Patient Stated Goals  some degree of easing the pain    Currently in Pain?  Yes    Pain Score  3     Pain Location  Neck    Pain Orientation  Right;Left    Pain Descriptors / Indicators  Aching    Pain Type  Chronic pain    Pain Onset  1 to 4 weeks ago  [place] Adult PT Treatment/Exercise - 09/22/18 0001      Neck Exercises: Machines for Strengthening   UBE (Upper Arm Bike)  x3 mins retro for postural strength      Neck Exercises: Theraband   Scapula Retraction  10 reps;Green    Scapula Retraction Limitations  2 sets    Shoulder Extension  10 reps;Green    Shoulder Extension Limitations  2 sets    Rows  10 reps;Green    Rows Limitations  2 sets    Horizontal ABduction  10 reps;Green    Horizontal ABduction Limitations  (seated) band pulls, 3sets    Other Theraband Exercises  (seated) scap retraction +ER GTB 3x10 reps      Neck Exercises: Supine   Neck Retraction  15 reps    Neck Retraction Limitations  3-5" holds, into towel, cues to reduce shoulder/back compensation    Other Supine Exercise  UE flex OH +chin tuck x15 reps (to improve thoracic extension/cervical spine dissociation from thoracic spine/GH movement)      Neck Exercises: Prone   Other Prone Exercise  Lower trap superman lift offs no weight x 10      Neck Exercises: Stretches   Upper  Trapezius Stretch  Right;Left;2 reps;30 seconds    Levator Stretch  Right;Left;2 reps;30 seconds    Corner Stretch  2 reps;30 seconds               PT Short Term Goals - 09/06/18 1143      PT SHORT TERM GOAL #1   Title  Pt will have improved cervical AROM by 5deg throughout all in order to demo reduced restrictions and improve his overall function.     Baseline  09/06/18: Achieved in some, but not all planes of movement see objective measures    Time  2    Period  Weeks    Status  Partially Met    Target Date  07/12/18      PT SHORT TERM GOAL #2   Title  Pt will report being able to participate in his regular workout routine with 3/10 neck pain or less afterwards to demo improved overall strength.    Baseline  09/06/18: Patient has not been able to return to his regular workout    Time  2    Period  Weeks    Status  On-going        PT Long Term Goals - 09/06/18 1144      PT LONG TERM GOAL #1   Title  Pt will have improved cervical AROM by 10deg throughout flexion, extension, and bil rotation in order to further reduce pain and maximize his [facility] duties with greater ease.     Baseline  09/06/18: See objective measures    Time  4    Period  Weeks    Status  On-going      PT LONG TERM GOAL #2   Title  Pt will report being able to perform yardwork with greater ease and with less neck pain following to demo improved strength and overall function at home.     Baseline  09/06/18: patient reporting that it has improved, but still has pain following yardwork    Time  4    Period  Weeks    Status  On-going      PT LONG TERM GOAL #3   Title  Pt will have reduced soft tissue restrictions to moderate throughout cervical mm in order to reduce overall pain  and maximize ROM.     Baseline  09/06/18: Patient with continued soft tissue restrictions    Time  4    Period  Weeks    Status  On-going            Plan - 09/22/18 1013    Clinical Impression Statement  This session  continued with a  focus on improving patient's cervical mobility and scapular as well as postural strengthening. This session added rows with green theraband and also added prone superman lift-offs for lower trapezius strengthening. Patient required cueing for improved form with exercises particularly to prevent thoracic compensation with exercises. Cued patient to engage abdominals throughout to reduce flaring of ribcage with exercises. Patient would benefit from continued skilled physical therapy in order to continue progressing towards functional goals.    Personal Factors and Comorbidities  Age;Comorbidity 3+    Examination-Activity Limitations  Lift    Examination-Participation Restrictions  Yard Work    Stability/Clinical Decision Making  Stable/Uncomplicated    Rehab Potential  Good    PT Frequency  2x / week    PT Duration  4 weeks    PT Treatment/Interventions  ADLs/Self Care Home Management;Aquatic Therapy;Cryotherapy;Electrical Stimulation;Moist Heat;Traction;Functional mobility training;Therapeutic activities;Ultrasound;Therapeutic exercise;Balance training;Neuromuscular re-education;Patient/family education;Orthotic Fit/Training;Manual techniques;Scar mobilization;Passive range of motion;Dry needling;Taping    PT Next Visit Plan  initiate dry neelding when able. continue stretching, pec stretching, and other postural training/strengthening, cervical and thoraic mobility work, manual for soft tissue restrictions and suboccipistal release    PT Home Exercise Plan  eval: seated scap retraction; 6/2: chin tucks (giving self double chin) in sitting    Consulted and Agree with Plan of Care  Patient       Patient will benefit from skilled therapeutic intervention in order to improve the following deficits and impairments:  Decreased activity tolerance, Decreased mobility, Decreased range of motion, Decreased strength, Hypomobility, Increased fascial restricitons, Increased muscle spasms,  Impaired flexibility, Improper body mechanics, Postural dysfunction, Pain  Visit Diagnosis: Cervicalgia  Abnormal posture  Other symptoms and signs involving the musculoskeletal system     Problem List Patient Active Problem List   Diagnosis Date Noted  . Intra-abdominal abscess ([place])   . Abscess of sigmoid colon due to diverticulitis   . Acute diverticulitis of intestine 12/27/2016  . Bowel perforation ([place])   . Perforation and abscess of large intestine concurrent with and due to diverticulitis 12/15/2016  . Blood in stool   . Second degree hemorrhoids   . Diverticulosis of large intestine without diverticulitis   . Essential hypertension, benign 02/03/2016  . Cervical spondylosis without myelopathy 01/22/2016  . Nocturnal leg cramps 07/24/2015  . Nontoxic multinodular goiter 01/31/2015  . Elevated transaminase level 10/26/2014  . Thrombocytopenia ([place]) 10/25/2014  . Myasthenia ([place]) 02/14/2013  . Convulsions/seizures ([place]) 02/14/2013   Clarene Critchley PT, DPT 10:16 AM, 09/22/18 [facility] [address] [place], [place], [address] Phone: [phone]   Fax:  [phone]  Name: Kennie Snedden. MRN: [id number] Date of Birth: January 12, 1935

## 2018-09-27 ENCOUNTER — Encounter (HOSPITAL_COMMUNITY): Payer: Self-pay

## 2018-09-27 ENCOUNTER — Ambulatory Visit (HOSPITAL_COMMUNITY): Payer: Medicare Other

## 2018-09-27 ENCOUNTER — Other Ambulatory Visit: Payer: Self-pay

## 2018-09-27 DIAGNOSIS — M542 Cervicalgia: Secondary | ICD-10-CM | POA: Diagnosis not present

## 2018-09-27 DIAGNOSIS — R29898 Other symptoms and signs involving the musculoskeletal system: Secondary | ICD-10-CM | POA: Diagnosis not present

## 2018-09-27 DIAGNOSIS — R293 Abnormal posture: Secondary | ICD-10-CM | POA: Diagnosis not present

## 2018-09-27 NOTE — Therapy (Signed)
[place], [place], [address] Phone: [phone]   Fax:  [phone]  Physical Therapy Treatment  Patient Details  Name: Kyle [name]. MRN: [id number] Date of Birth: [birth year] Referring Provider (PT): Melina Schools, MD   Encounter Date: 09/27/2018  PT End of Session - 09/27/18 1010    Visit Number  8    Number of Visits  16    Date for PT Re-Evaluation  10/04/18    Authorization Type  [employer] Medicare    Authorization Time Period  06/28/18 to 07/26/18; NEW 09/06/18 to 10/04/18    Authorization - Visit Number  6    Authorization - Number of Visits  10    PT Start Time  0254    PT Stop Time  2706    PT Time Calculation (min)  46 min    Activity Tolerance  Patient tolerated treatment well;No increased pain    Behavior During Therapy  WFL for tasks assessed/performed       Past Medical History:  Diagnosis Date  . Arthritis   . Auto immune neutropenia (HCC)   . Bullous pemphigoid   . Cancer [facility])    Prostate  . Cervical spondylosis without myelopathy 01/22/2016  . DVT (deep venous thrombosis) ([place])    a. Has had a previous right lower extremity DVT in the setting of hospitalization and surgery (after his brain tumor was resected in 1993) but is no longer on Coumadin.  Marland Kitchen Dyslipidemia   . History of prostate cancer   . Hypertension   . Meningioma ([place])    a. s/p resection 1990s.  . Myasthenia ([place]) 02/14/2013  . Nocturnal leg cramps   . Ocular myasthenia gravis ([place])   . Seizures ([place])   . Sinus bradycardia   . Sleep paralysis   . Thyroid nodule, cold     Past Surgical History:  Procedure Laterality Date  . BRAIN SURGERY    . CHOLECYSTECTOMY    . COLONOSCOPY N/A 07/25/2013   Procedure: COLONOSCOPY;  Surgeon: Jamesetta So, MD;  [facility];  Service: Gastroenterology;  Laterality: N/A;  . COLONOSCOPY N/A 11/10/2016   Procedure: COLONOSCOPY;  Surgeon: Aviva Signs, MD;  Location[facility];  Service: Gastroenterology;  Laterality: N/A;  . IR CATHETER TUBE CHANGE  01/02/2017  . IR RADIOLOGIST EVAL & MGMT  01/06/2017  . IR RADIOLOGIST EVAL & MGMT  01/20/2017  . IR RADIOLOGIST EVAL & MGMT  02/03/2017  . IR RADIOLOGIST EVAL & MGMT  02/17/2017  . IR SINUS/FIST TUBE CHK-NON GI  03/03/2017  . RADIOACTIVE SEED IMPLANT      There were no vitals filed for this visit.  Subjective Assessment - 09/27/18 1012    Subjective  Pt reports it has been more sore with turning over the last few days.    Limitations  Lifting;House hold activities    Diagnostic tests  x-rays    Patient Stated Goals  some degree of easing the pain    Currently in Pain?  Yes    Pain Score  3     Pain Location  Neck    Pain Orientation  Right;Left    Pain Descriptors / Indicators  Aching    Pain Type  Chronic pain    Pain Onset  1 to 4 weeks ago    Pain Frequency  Intermittent    Aggravating Factors   moving    Pain Relieving Factors  keeping still  Effect of Pain on Daily Activities  does them just has increased pain           [facility] Adult PT Treatment/Exercise - 09/27/18 0001      Neck Exercises: Theraband   Scapula Retraction  10 reps;Green    Scapula Retraction Limitations  2 sets    Shoulder Extension  10 reps;Green    Shoulder Extension Limitations  2 sets      Neck Exercises: Standing   Wall Push Ups  10 reps    Wall Push Ups Limitations  2 sets, with serratus punch at end range      Neck Exercises: Seated   Neck Retraction  20 reps    Neck Retraction Limitations  verbal and tactile cues for form - did best with double chin VC      Manual Therapy   Manual Therapy  Soft tissue mobilization;Manual Traction    Soft tissue mobilization  STM after needling to UT, cervical paraspinals, and suboccipitals to reduce restrictions, pain, and post-needling soreness    Manual Traction  suboccipital release to reduce pain and improve cervical posture      Neck Exercises: Stretches   Upper  Trapezius Stretch  Right;Left;2 reps;30 seconds    Levator Stretch  Right;Left;2 reps;30 seconds    Corner Stretch  2 reps;30 seconds       Trigger Point Dry Needling - 09/27/18 0001    Consent Given?  Yes    Education Handout Provided  Yes    Muscles Treated Head and Neck  Upper trapezius    Upper Trapezius Response  Twitch reponse elicited;Palpable increased muscle length   R in prone and supine, better response in supine          PT Education - 09/27/18 1011    Education Details  risks and benefits of dry needling, expect soreness from needling; exercise technique, continue HEP    Person(s) Educated  Patient    Methods  Explanation;Demonstration    Comprehension  Returned demonstration;Verbalized understanding       PT Short Term Goals - 09/06/18 1143      PT SHORT TERM GOAL #1   Title  Pt will have improved cervical AROM by 5deg throughout all in order to demo reduced restrictions and improve his overall function.     Baseline  09/06/18: Achieved in some, but not all planes of movement see objective measures    Time  2    Period  Weeks    Status  Partially Met    Target Date  07/12/18      PT SHORT TERM GOAL #2   Title  Pt will report being able to participate in his regular workout routine with 3/10 neck pain or less afterwards to demo improved overall strength.    Baseline  09/06/18: Patient has not been able to return to his regular workout    Time  2    Period  Weeks    Status  On-going        PT Long Term Goals - 09/06/18 1144      PT LONG TERM GOAL #1   Title  Pt will have improved cervical AROM by 10deg throughout flexion, extension, and bil rotation in order to further reduce pain and maximize his [facility] duties with greater ease.     Baseline  09/06/18: See objective measures    Time  4    Period  Weeks    Status  On-going  PT LONG TERM GOAL #2   Title  Pt will report being able to perform yardwork with greater ease and with less neck pain following  to demo improved strength and overall function at home.     Baseline  09/06/18: patient reporting that it has improved, but still has pain following yardwork    Time  4    Period  Weeks    Status  On-going      PT LONG TERM GOAL #3   Title  Pt will have reduced soft tissue restrictions to moderate throughout cervical mm in order to reduce overall pain and maximize ROM.     Baseline  09/06/18: Patient with continued soft tissue restrictions    Time  4    Period  Weeks    Status  On-going            Plan - 09/27/18 1101    Clinical Impression Statement  Pt agreeable to dry needling this date; performed needling to R UT with pt in prone and supine, however, achieved the best twitch response with pt in supine. Followed up with manual STM to UT, cervical paraspinals, and suboccipitals to further reduce restrictions, pain, post-needling soreness, and improve overall cervical ROM/posture. Continued progressing pt with standing postural strengthening exercises using GTB and added wall pushups with serratus anterior punch at end range to further improve scapular stability and postural alignment. Pt denies pain throughout exercises. Verbal cues throughout standing exercises for relaxing of shoulders and chin tucks to prevent compensations and improve body mechanics. Continue to progress as pt tolerates.    Personal Factors and Comorbidities  Age;Comorbidity 3+    Examination-Activity Limitations  Lift    Examination-Participation Restrictions  Yard Work    Stability/Clinical Decision Making  Stable/Uncomplicated    Rehab Potential  Good    PT Frequency  2x / week    PT Duration  4 weeks    PT Treatment/Interventions  ADLs/Self Care Home Management;Aquatic Therapy;Cryotherapy;Electrical Stimulation;Moist Heat;Traction;Functional mobility training;Therapeutic activities;Ultrasound;Therapeutic exercise;Balance training;Neuromuscular re-education;Patient/family education;Orthotic Fit/Training;Manual  techniques;Scar mobilization;Passive range of motion;Dry needling;Taping    PT Next Visit Plan  continue dry neelding if positive response. continue stretching, pec stretching, and progress postural training/strengthening, cervical and thoraic mobility work, manual for soft tissue restrictions and suboccipistal release    PT Home Exercise Plan  eval: seated scap retraction; 6/2: chin tucks (giving self double chin) in sitting    Consulted and Agree with Plan of Care  Patient       Patient will benefit from skilled therapeutic intervention in order to improve the following deficits and impairments:  Decreased activity tolerance, Decreased mobility, Decreased range of motion, Decreased strength, Hypomobility, Increased fascial restricitons, Increased muscle spasms, Impaired flexibility, Improper body mechanics, Postural dysfunction, Pain  Visit Diagnosis: 1. Cervicalgia   2. Abnormal posture   3. Other symptoms and signs involving the musculoskeletal system        Problem List Patient Active Problem List   Diagnosis Date Noted  . Intra-abdominal abscess ([place])   . Abscess of sigmoid colon due to diverticulitis   . Acute diverticulitis of intestine 12/27/2016  . Bowel perforation ([place])   . Perforation and abscess of large intestine concurrent with and due to diverticulitis 12/15/2016  . Blood in stool   . Second degree hemorrhoids   . Diverticulosis of large intestine without diverticulitis   . Essential hypertension, benign 02/03/2016  . Cervical spondylosis without myelopathy 01/22/2016  . Nocturnal leg cramps 07/24/2015  . Nontoxic multinodular  goiter 01/31/2015  . Elevated transaminase level 10/26/2014  . Thrombocytopenia ([place]) 10/25/2014  . Myasthenia ([place]) 02/14/2013  . Convulsions/seizures ([place]) 02/14/2013    Talbot Grumbling, PT, DPT  [facility] Solar PT, [place] [address] [place], [place], [address] Phone:  [phone]   Fax:  [phone]  Name: Kyle [name]. MRN: [id number] Date of Birth: [birth year]

## 2018-09-27 NOTE — Patient Instructions (Signed)

## 2018-09-28 ENCOUNTER — Telehealth (HOSPITAL_COMMUNITY): Payer: Self-pay | Admitting: Internal Medicine

## 2018-09-28 NOTE — Telephone Encounter (Signed)
09/28/18  pt called to say that he confirmed appt on phone tree but since then something has come up and he can't come to the appt tomorrow

## 2018-09-29 ENCOUNTER — Ambulatory Visit (HOSPITAL_COMMUNITY): Payer: Medicare Other

## 2018-10-04 ENCOUNTER — Encounter (HOSPITAL_COMMUNITY): Payer: Self-pay

## 2018-10-04 ENCOUNTER — Ambulatory Visit (HOSPITAL_COMMUNITY): Payer: Medicare Other

## 2018-10-04 ENCOUNTER — Other Ambulatory Visit: Payer: Self-pay

## 2018-10-04 DIAGNOSIS — M542 Cervicalgia: Secondary | ICD-10-CM

## 2018-10-04 DIAGNOSIS — R293 Abnormal posture: Secondary | ICD-10-CM | POA: Diagnosis not present

## 2018-10-04 DIAGNOSIS — R29898 Other symptoms and signs involving the musculoskeletal system: Secondary | ICD-10-CM | POA: Diagnosis not present

## 2018-10-04 NOTE — Therapy (Signed)
[place], [place], [address] Phone: [phone]   Fax:  [phone]  Physical Therapy Treatment  Patient Details  Name: Kyle [name]. MRN: [id number] Date of Birth: 30-Aug-1934 Referring Provider (PT): Melina Schools, MD   Encounter Date: 10/04/2018  PT End of Session - 10/04/18 1016    Visit Number  9    Number of Visits  16    Date for PT Re-Evaluation  10/07/18   corrected on 10/04/18   Authorization Type  [employer] Medicare    Authorization Time Period  06/28/18 to 07/26/18; NEW 09/06/18 to 10/07/18   corrected on 10/04/18   Authorization - Visit Number  7    Authorization - Number of Visits  10    PT Start Time  1012    PT Stop Time  5188    PT Time Calculation (min)  41 min    Activity Tolerance  Patient tolerated treatment well;No increased pain    Behavior During Therapy  WFL for tasks assessed/performed       Past Medical History:  Diagnosis Date  . Arthritis   . Auto immune neutropenia (HCC)   . Bullous pemphigoid   . Cancer [facility])    Prostate  . Cervical spondylosis without myelopathy 01/22/2016  . DVT (deep venous thrombosis) ([place])    a. Has had a previous right lower extremity DVT in the setting of hospitalization and surgery (after his brain tumor was resected in 1993) but is no longer on Coumadin.  Marland Kitchen Dyslipidemia   . History of prostate cancer   . Hypertension   . Meningioma ([place])    a. s/p resection 1990s.  . Myasthenia ([place]) 02/14/2013  . Nocturnal leg cramps   . Ocular myasthenia gravis ([place])   . Seizures ([place])   . Sinus bradycardia   . Sleep paralysis   . Thyroid nodule, cold     Past Surgical History:  Procedure Laterality Date  . BRAIN SURGERY    . CHOLECYSTECTOMY    . COLONOSCOPY N/A 07/25/2013   Procedure: COLONOSCOPY;  Surgeon: Jamesetta So, MD;  [facility];  Service: Gastroenterology;  Laterality: N/A;  . COLONOSCOPY N/A 11/10/2016   Procedure: COLONOSCOPY;   Surgeon: Aviva Signs, MD;  [facility];  Service: Gastroenterology;  Laterality: N/A;  . IR CATHETER TUBE CHANGE  01/02/2017  . IR RADIOLOGIST EVAL & MGMT  01/06/2017  . IR RADIOLOGIST EVAL & MGMT  01/20/2017  . IR RADIOLOGIST EVAL & MGMT  02/03/2017  . IR RADIOLOGIST EVAL & MGMT  02/17/2017  . IR SINUS/FIST TUBE CHK-NON GI  03/03/2017  . RADIOACTIVE SEED IMPLANT      There were no vitals filed for this visit.  Subjective Assessment - 10/04/18 1014    Subjective  Pt reports very little pain in neck since dry needling 1 week ago, but with increase in pain since working in garden yesterday.    Limitations  Lifting;House hold activities    Diagnostic tests  x-rays    Patient Stated Goals  some degree of easing the pain    Currently in Pain?  No/denies    Pain Onset  1 to 4 weeks ago              [facility] Adult PT Treatment/Exercise - 10/04/18 0001      Neck Exercises: Seated   Other Seated Exercise  Scap retraction, 20 reps with 5" hold  Neck Exercises: Prone   Other Prone Exercise  BUE I's focusing on lower trap activation, 2x10 reps, no weight    Other Prone Exercise  BUE T's 2x10 reps, no weight      Manual Therapy   Manual Therapy  Soft tissue mobilization;Manual Traction    Manual therapy comments  done seperate from all other activities of therapy     Soft tissue mobilization  STM after needling to UT, cervical paraspinals, and suboccipitals to reduce restrictions, pain, and post-needling soreness    Manual Traction  suboccipital release to reduce pain and improve cervical posture      Neck Exercises: Stretches   Upper Trapezius Stretch  Right;Left;2 reps;30 seconds       Trigger Point Dry Needling - 10/04/18 0001    Consent Given?  Yes    Education Handout Provided  Previously provided    Muscles Treated Head and Neck  Upper trapezius    Upper Trapezius Response  Twitch reponse elicited;Palpable increased muscle length   R in supine           PT Education - 10/04/18 1015    Education Details  exercise technique, continue HEP, expect soreness from dry needling    Person(s) Educated  Patient    Methods  Explanation    Comprehension  Verbalized understanding       PT Short Term Goals - 09/06/18 1143      PT SHORT TERM GOAL #1   Title  Pt will have improved cervical AROM by 5deg throughout all in order to demo reduced restrictions and improve his overall function.     Baseline  09/06/18: Achieved in some, but not all planes of movement see objective measures    Time  2    Period  Weeks    Status  Partially Met    Target Date  07/12/18      PT SHORT TERM GOAL #2   Title  Pt will report being able to participate in his regular workout routine with 3/10 neck pain or less afterwards to demo improved overall strength.    Baseline  09/06/18: Patient has not been able to return to his regular workout    Time  2    Period  Weeks    Status  On-going        PT Long Term Goals - 09/06/18 1144      PT LONG TERM GOAL #1   Title  Pt will have improved cervical AROM by 10deg throughout flexion, extension, and bil rotation in order to further reduce pain and maximize his [facility] duties with greater ease.     Baseline  09/06/18: See objective measures    Time  4    Period  Weeks    Status  On-going      PT LONG TERM GOAL #2   Title  Pt will report being able to perform yardwork with greater ease and with less neck pain following to demo improved strength and overall function at home.     Baseline  09/06/18: patient reporting that it has improved, but still has pain following yardwork    Time  4    Period  Weeks    Status  On-going      PT LONG TERM GOAL #3   Title  Pt will have reduced soft tissue restrictions to moderate throughout cervical mm in order to reduce overall pain and maximize ROM.     Baseline  09/06/18: Patient with continued  soft tissue restrictions    Time  4    Period  Weeks    Status  On-going             Plan - 10/04/18 1018    Clinical Impression Statement  Pt returns to therapy reporting + response to dry needling last session, stating that his pain had been relatively gone for the last week until yesterday when he dug up potatoes. Resumed DN again this date and performed on bil upper trap this date to further reduce restrictions and pain. Good twitch responses elicited bilaterally. Followed up with manual to further reduce pain, restrictions, and post-needling soreness. Ended with lower and middle trap activation to facilitate reduced upper trap compensation. Pt requiring cues on RUE > LUE to reduce UT compensation. No pain reported at EOS, just some soreness. Pt due for reassessment.    Personal Factors and Comorbidities  Age;Comorbidity 3+    Examination-Activity Limitations  Lift    Examination-Participation Restrictions  Yard Work    Stability/Clinical Decision Making  Stable/Uncomplicated    Rehab Potential  Good    PT Frequency  2x / week    PT Duration  4 weeks    PT Treatment/Interventions  ADLs/Self Care Home Management;Aquatic Therapy;Cryotherapy;Electrical Stimulation;Moist Heat;Traction;Functional mobility training;Therapeutic activities;Ultrasound;Therapeutic exercise;Balance training;Neuromuscular re-education;Patient/family education;Orthotic Fit/Training;Manual techniques;Scar mobilization;Passive range of motion;Dry needling;Taping    PT Next Visit Plan  reassessment; continue dry neelding if positive response. continue stretching, pec stretching, and progress postural training/strengthening, cervical and thoraic mobility work, manual for soft tissue restrictions and suboccipistal release    PT Home Exercise Plan  eval: seated scap retraction; 6/2: chin tucks (giving self double chin) in sitting; 6/23: corner pec stretch    Consulted and Agree with Plan of Care  Patient       Patient will benefit from skilled therapeutic intervention in order to improve the  following deficits and impairments:  Decreased activity tolerance, Decreased mobility, Decreased range of motion, Decreased strength, Hypomobility, Increased fascial restricitons, Increased muscle spasms, Impaired flexibility, Improper body mechanics, Postural dysfunction, Pain  Visit Diagnosis: 1. Cervicalgia   2. Abnormal posture   3. Other symptoms and signs involving the musculoskeletal system        Problem List Patient Active Problem List   Diagnosis Date Noted  . Intra-abdominal abscess ([place])   . Abscess of sigmoid colon due to diverticulitis   . Acute diverticulitis of intestine 12/27/2016  . Bowel perforation ([place])   . Perforation and abscess of large intestine concurrent with and due to diverticulitis 12/15/2016  . Blood in stool   . Second degree hemorrhoids   . Diverticulosis of large intestine without diverticulitis   . Essential hypertension, benign 02/03/2016  . Cervical spondylosis without myelopathy 01/22/2016  . Nocturnal leg cramps 07/24/2015  . Nontoxic multinodular goiter 01/31/2015  . Elevated transaminase level 10/26/2014  . Thrombocytopenia ([place]) 10/25/2014  . Myasthenia ([place]) 02/14/2013  . Convulsions/seizures ([place]) 02/14/2013        Geraldine Solar PT, [place] [address] [place], [place], [address] Phone: [phone]   Fax:  [phone]  Name: Elison Worrel. MRN: [id number] Date of Birth: 12-22-1934

## 2018-10-06 ENCOUNTER — Encounter (HOSPITAL_COMMUNITY): Payer: Self-pay | Admitting: Physical Therapy

## 2018-10-06 ENCOUNTER — Other Ambulatory Visit: Payer: Self-pay

## 2018-10-06 ENCOUNTER — Ambulatory Visit (HOSPITAL_COMMUNITY): Payer: Medicare Other | Admitting: Physical Therapy

## 2018-10-06 DIAGNOSIS — M542 Cervicalgia: Secondary | ICD-10-CM | POA: Diagnosis not present

## 2018-10-06 DIAGNOSIS — R293 Abnormal posture: Secondary | ICD-10-CM | POA: Diagnosis not present

## 2018-10-06 DIAGNOSIS — R29898 Other symptoms and signs involving the musculoskeletal system: Secondary | ICD-10-CM

## 2018-10-06 NOTE — Therapy (Signed)
[facility] [address] [place], [place], [address] Phone: [phone]   Fax:  [phone]  Physical Therapy Treatment / Re-assessment  Patient Details  Name: Kyle [name]. MRN: [id number] Date of Birth: 1935-03-17 Referring Provider (PT): Melina Schools, MD   Encounter Date: 10/06/2018   Progress Note Reporting Period 09/06/18 to 10/06/18  See note below for Objective Data and Assessment of Progress/Goals.       PT End of Session - 10/06/18 0936    Visit Number  10    Number of Visits  16    Date for PT Re-Evaluation  10/21/18   corrected on 10/04/18   Authorization Type  [employer] Medicare    Authorization Time Period  06/28/18 to 07/26/18; NEW 09/06/18 to 10/07/18   corrected on 10/04/18   Authorization - Visit Number  8    Authorization - Number of Visits  10    PT Start Time  0927    PT Stop Time  5374   Patient requested to end early due to personal matter   PT Time Calculation (min)  28 min    Activity Tolerance  Patient tolerated treatment well;No increased pain    Behavior During Therapy  WFL for tasks assessed/performed       Past Medical History:  Diagnosis Date  . Arthritis   . Auto immune neutropenia (HCC)   . Bullous pemphigoid   . Cancer [facility])    Prostate  . Cervical spondylosis without myelopathy 01/22/2016  . DVT (deep venous thrombosis) ([place])    a. Has had a previous right lower extremity DVT in the setting of hospitalization and surgery (after his brain tumor was resected in 1993) but is no longer on Coumadin.  Marland Kitchen Dyslipidemia   . History of prostate cancer   . Hypertension   . Meningioma ([place])    a. s/p resection 1990s.  . Myasthenia ([place]) 02/14/2013  . Nocturnal leg cramps   . Ocular myasthenia gravis ([place])   . Seizures ([place])   . Sinus bradycardia   . Sleep paralysis   . Thyroid nodule, cold     Past Surgical History:  Procedure Laterality Date  . BRAIN SURGERY    . CHOLECYSTECTOMY    .  COLONOSCOPY N/A 07/25/2013   Procedure: COLONOSCOPY;  Surgeon: Jamesetta So, MD;  [facility];  Service: Gastroenterology;  Laterality: N/A;  . COLONOSCOPY N/A 11/10/2016   Procedure: COLONOSCOPY;  Surgeon: Aviva Signs, MD;  [facility];  Service: Gastroenterology;  Laterality: N/A;  . IR CATHETER TUBE CHANGE  01/02/2017  . IR RADIOLOGIST EVAL & MGMT  01/06/2017  . IR RADIOLOGIST EVAL & MGMT  01/20/2017  . IR RADIOLOGIST EVAL & MGMT  02/03/2017  . IR RADIOLOGIST EVAL & MGMT  02/17/2017  . IR SINUS/FIST TUBE CHK-NON GI  03/03/2017  . RADIOACTIVE SEED IMPLANT      There were no vitals filed for this visit.  Subjective Assessment - 10/06/18 0930    Subjective  Patient reported that when he moves his neck he feels pain that he rates as a 3/10. Reported a maximum of 6/10 pain.    Limitations  Lifting;House hold activities    Diagnostic tests  x-rays    Patient Stated Goals  some degree of easing the pain    Pain Score  3     Pain Onset  1 to 4 weeks ago         [facility]  PT Assessment - 10/06/18 0001      Assessment   Medical Diagnosis  degeneration of cervical intervertebral disc    Referring Provider (PT)  Melina Schools, MD    Hand Dominance  Right    Prior Therapy  none      Prior Function   Level of Independence  Independent    Vocation  Retired    Leisure  work in the garden, Haematologist, watching the ballgames      Observation/Other Assessments   Focus on Therapeutic Outcomes (FOTO)   35% limited      AROM   Cervical Flexion  40   was 40   Cervical Extension  55   was 43   Cervical - Right Side Bend  38   was 25   Cervical - Left Side Bend  35   was 20   Cervical - Right Rotation  71   was 69   Cervical - Left Rotation  70   was 63     Palpation   Palpation comment  max restrictions in bil upper trap, levator scap, SCM, scalenes, cervical paraspinals, and suboccipitals; palpation to levator scap, SCM, and paraspinals recreated same pain but  palpation throughout all mm groups tender                   [facility] Adult PT Treatment/Exercise - 10/06/18 0001      Manual Therapy   Manual Therapy  Soft tissue mobilization    Manual therapy comments  done seperate from all other activities of therapy     Soft tissue mobilization  STM patient seated to Upper traps, cervical paraspinals, and sub-occipitals             PT Education - 10/06/18 1003    Education Details  Discussed continuing therapy for a couple weeks for continued dry needling.    Person(s) Educated  Patient    Methods  Explanation    Comprehension  Verbalized understanding       PT Short Term Goals - 10/06/18 1006      PT SHORT TERM GOAL #1   Title  Pt will have improved cervical AROM by 5deg throughout all in order to demo reduced restrictions and improve his overall function.     Baseline  10/06/18: Achieved in all except cervical flexion    Time  1    Period  Weeks    Status  Partially Met    Target Date  10/13/18      PT SHORT TERM GOAL #2   Title  Pt will report being able to participate in his regular workout routine with 3/10 neck pain or less afterwards to demo improved overall strength.    Baseline  10/06/18: Patient has not been able to return to his regular workout    Time  1    Period  Weeks    Status  On-going    Target Date  10/13/18        PT Long Term Goals - 10/06/18 1007      PT LONG TERM GOAL #1   Title  Pt will have improved cervical AROM by 10deg throughout flexion, extension, and bil rotation in order to further reduce pain and maximize his [facility] duties with greater ease.     Baseline  10/06/18: Achieved in some, but not all ROM    Time  2    Period  Weeks    Status  Partially Met  Target Date  10/20/18      PT LONG TERM GOAL #2   Title  Pt will report being able to perform yardwork with greater ease and with less neck pain following to demo improved strength and overall function at home.     Baseline  10/06/18:  Patient reported an increase in neck pain following yardwork this week    Time  2    Period  Weeks    Status  On-going    Target Date  10/20/18      PT LONG TERM GOAL #3   Title  Pt will have reduced soft tissue restrictions to moderate throughout cervical mm in order to reduce overall pain and maximize ROM.     Baseline  10/06/18: Patient with continued soft tissue restrictions    Time  2    Period  Weeks    Status  On-going    Target Date  10/20/18            Plan - 10/06/18 1020    Clinical Impression Statement  Performed re-assessment of patient's progress towards goals. Since last re-assessment, patient has made good progress in cervical AROM except in cervical flexion which has remained about the same, likely limited due to patient's forward head posture and boney alignment. In addition, patient has overall had improvements in his pain level and improved score on FOTO, but he did report an occasional spike in his pain following doing yardwork. Feel that patient has benefitted from dry-needling and think patient would benefit from 2 additional weeks of dry-needling treatments to address continued cervical muscle restrictions. Patient consented to this plan. Remainder of session, performed soft tissue mobilization. Session, was limited due to patient requiring to leave early for a personal matter.    Personal Factors and Comorbidities  Age;Comorbidity 3+    Examination-Activity Limitations  Lift    Examination-Participation Restrictions  Yard Work    Stability/Clinical Decision Making  Stable/Uncomplicated    Rehab Potential  Good    PT Frequency  2x / week    PT Duration  4 weeks    PT Treatment/Interventions  ADLs/Self Care Home Management;Aquatic Therapy;Cryotherapy;Electrical Stimulation;Moist Heat;Traction;Functional mobility training;Therapeutic activities;Ultrasound;Therapeutic exercise;Balance training;Neuromuscular re-education;Patient/family education;Orthotic  Fit/Training;Manual techniques;Scar mobilization;Passive range of motion;Dry needling;Taping    PT Next Visit Plan  continue dry neelding. continue stretching, pec stretching, and progress postural training/strengthening, cervical and thoraic mobility work, manual for soft tissue restrictions and suboccipistal release    PT Home Exercise Plan  eval: seated scap retraction; 6/2: chin tucks (giving self double chin) in sitting; 6/23: corner pec stretch    Consulted and Agree with Plan of Care  Patient       Patient will benefit from skilled therapeutic intervention in order to improve the following deficits and impairments:  Decreased activity tolerance, Decreased mobility, Decreased range of motion, Decreased strength, Hypomobility, Increased fascial restricitons, Increased muscle spasms, Impaired flexibility, Improper body mechanics, Postural dysfunction, Pain  Visit Diagnosis: 1. Cervicalgia   2. Abnormal posture   3. Other symptoms and signs involving the musculoskeletal system        Problem List Patient Active Problem List   Diagnosis Date Noted  . Intra-abdominal abscess ([place])   . Abscess of sigmoid colon due to diverticulitis   . Acute diverticulitis of intestine 12/27/2016  . Bowel perforation ([place])   . Perforation and abscess of large intestine concurrent with and due to diverticulitis 12/15/2016  . Blood in stool   . Second degree hemorrhoids   .  Diverticulosis of large intestine without diverticulitis   . Essential hypertension, benign 02/03/2016  . Cervical spondylosis without myelopathy 01/22/2016  . Nocturnal leg cramps 07/24/2015  . Nontoxic multinodular goiter 01/31/2015  . Elevated transaminase level 10/26/2014  . Thrombocytopenia ([place]) 10/25/2014  . Myasthenia ([place]) 02/14/2013  . Convulsions/seizures ([place]) 02/14/2013   Clarene Critchley PT, DPT 10:22 AM, 10/06/18 [facility] [address] [place], [place],  [address] Phone: [phone]   Fax:  [phone]  Name: Harace Mccluney. MRN: [id number] Date of Birth: [birth year]

## 2018-10-11 ENCOUNTER — Ambulatory Visit (HOSPITAL_COMMUNITY): Payer: Medicare Other

## 2018-10-11 ENCOUNTER — Encounter (HOSPITAL_COMMUNITY): Payer: Self-pay

## 2018-10-11 ENCOUNTER — Other Ambulatory Visit: Payer: Self-pay

## 2018-10-11 DIAGNOSIS — R293 Abnormal posture: Secondary | ICD-10-CM

## 2018-10-11 DIAGNOSIS — R29898 Other symptoms and signs involving the musculoskeletal system: Secondary | ICD-10-CM

## 2018-10-11 DIAGNOSIS — M542 Cervicalgia: Secondary | ICD-10-CM
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
## 2018-10-13 ENCOUNTER — Ambulatory Visit (HOSPITAL_COMMUNITY): Payer: Medicare Other | Attending: Orthopedic Surgery

## 2018-10-13 ENCOUNTER — Other Ambulatory Visit: Payer: Self-pay

## 2018-10-13 ENCOUNTER — Encounter (HOSPITAL_COMMUNITY): Payer: Self-pay

## 2018-10-13 DIAGNOSIS — R293 Abnormal posture: Secondary | ICD-10-CM

## 2018-10-13 DIAGNOSIS — R29898 Other symptoms and signs involving the musculoskeletal system: Secondary | ICD-10-CM | POA: Diagnosis not present

## 2018-10-13 DIAGNOSIS — M542 Cervicalgia: Secondary | ICD-10-CM | POA: Insufficient documentation

## 2018-10-13 NOTE — Therapy (Signed)
[place], [place], [address] Phone: [phone]   Fax:  [phone]  Physical Therapy Treatment  Patient Details  Name: Kyle [name]. MRN: [id number] Date of Birth: 09-03-1934 Referring Provider (PT): Melina Schools, MD   Encounter Date: 10/13/2018  PT End of Session - 10/13/18 0909    Visit Number  12    Number of Visits  16    Date for PT Re-Evaluation  10/21/18   corrected on 10/04/18   Authorization Type  [employer] Medicare    Authorization Time Period  06/28/18 to 07/26/18; NEW 09/06/18 to 10/07/18   corrected on 10/04/18   Authorization - Visit Number  10    Authorization - Number of Visits  16    PT Start Time  0910    PT Stop Time  0953    PT Time Calculation (min)  43 min    Activity Tolerance  Patient tolerated treatment well;No increased pain    Behavior During Therapy  WFL for tasks assessed/performed       Past Medical History:  Diagnosis Date  . Arthritis   . Auto immune neutropenia (HCC)   . Bullous pemphigoid   . Cancer [facility])    Prostate  . Cervical spondylosis without myelopathy 01/22/2016  . DVT (deep venous thrombosis) ([place])    a. Has had a previous right lower extremity DVT in the setting of hospitalization and surgery (after his brain tumor was resected in 1993) but is no longer on Coumadin.  Marland Kitchen Dyslipidemia   . History of prostate cancer   . Hypertension   . Meningioma ([place])    a. s/p resection 1990s.  . Myasthenia ([place]) 02/14/2013  . Nocturnal leg cramps   . Ocular myasthenia gravis ([place])   . Seizures ([place])   . Sinus bradycardia   . Sleep paralysis   . Thyroid nodule, cold     Past Surgical History:  Procedure Laterality Date  . BRAIN SURGERY    . CHOLECYSTECTOMY    . COLONOSCOPY N/A 07/25/2013   Procedure: COLONOSCOPY;  Surgeon: Jamesetta So, MD;  [facility];  Service: Gastroenterology;  Laterality: N/A;  . COLONOSCOPY N/A 11/10/2016   Procedure: COLONOSCOPY;   Surgeon: Aviva Signs, MD;  [facility];  Service: Gastroenterology;  Laterality: N/A;  . IR CATHETER TUBE CHANGE  01/02/2017  . IR RADIOLOGIST EVAL & MGMT  01/06/2017  . IR RADIOLOGIST EVAL & MGMT  01/20/2017  . IR RADIOLOGIST EVAL & MGMT  02/03/2017  . IR RADIOLOGIST EVAL & MGMT  02/17/2017  . IR SINUS/FIST TUBE CHK-NON GI  03/03/2017  . RADIOACTIVE SEED IMPLANT      There were no vitals filed for this visit.  Subjective Assessment - 10/13/18 0909    Subjective  Pt states that his neck is overall better. Just a little pain when he moves it.    Limitations  Lifting;House hold activities    Diagnostic tests  x-rays    Patient Stated Goals  some degree of easing the pain    Currently in Pain?  No/denies    Pain Onset  1 to 4 weeks ago             [facility] Adult PT Treatment/Exercise - 10/13/18 0001      Neck Exercises: Machines for Strengthening   UBE (Upper Arm Bike)  L3 x3 mins retro for postural strength    Cybex Row  40#, 2x10 reps  Neck Exercises: Theraband   Other Theraband Exercises  Y raises (lower trap) RTB 2x10 reps      Neck Exercises: Standing   Wall Push Ups  10 reps    Wall Push Ups Limitations  2 sets, with serratus punch at end range    Upper Extremity D2  Flexion;10 reps;Theraband    Theraband Level (UE D2)  Level 2 (Red)    UE D2 Limitations  2 sets, BUE    Other Standing Exercises  UE flexion (thumbs to wall) +cervical retraction x20 reps      Manual Therapy   Manual Therapy  Soft tissue mobilization    Manual therapy comments  done seperate from all other activities of therapy     Soft tissue mobilization  STM to upper trap, cervical paraspinals, suboccipitals to reduce pain and restrictions with pt in supine      Neck Exercises: Stretches   Upper Trapezius Stretch  Right;Left;2 reps;30 seconds    Levator Stretch  Right;Left;2 reps;30 seconds             PT Education - 10/13/18 0909    Education Details  exercise technique,  updated HEP    Person(s) Educated  Patient    Methods  Explanation;Demonstration    Comprehension  Verbalized understanding;Returned demonstration       PT Short Term Goals - 10/06/18 1006      PT SHORT TERM GOAL #1   Title  Pt will have improved cervical AROM by 5deg throughout all in order to demo reduced restrictions and improve his overall function.     Baseline  10/06/18: Achieved in all except cervical flexion    Time  1    Period  Weeks    Status  Partially Met    Target Date  10/13/18      PT SHORT TERM GOAL #2   Title  Pt will report being able to participate in his regular workout routine with 3/10 neck pain or less afterwards to demo improved overall strength.    Baseline  10/06/18: Patient has not been able to return to his regular workout    Time  1    Period  Weeks    Status  On-going    Target Date  10/13/18        PT Long Term Goals - 10/06/18 1007      PT LONG TERM GOAL #1   Title  Pt will have improved cervical AROM by 10deg throughout flexion, extension, and bil rotation in order to further reduce pain and maximize his [facility] duties with greater ease.     Baseline  10/06/18: Achieved in some, but not all ROM    Time  2    Period  Weeks    Status  Partially Met    Target Date  10/20/18      PT LONG TERM GOAL #2   Title  Pt will report being able to perform yardwork with greater ease and with less neck pain following to demo improved strength and overall function at home.     Baseline  10/06/18: Patient reported an increase in neck pain following yardwork this week    Time  2    Period  Weeks    Status  On-going    Target Date  10/20/18      PT LONG TERM GOAL #3   Title  Pt will have reduced soft tissue restrictions to moderate throughout cervical mm in order to reduce overall  pain and maximize ROM.     Baseline  10/06/18: Patient with continued soft tissue restrictions    Time  2    Period  Weeks    Status  On-going    Target Date  10/20/18             Plan - 10/13/18 0959    Clinical Impression Statement  Pt reporting to session reporting overall general progress with therapy. Continued with established POC focusing on overall mobility, postural training and strength, as well as manual techniques to further reduce pain and promote cervical ROM. Updated HEP this date to include upper trap and levator scap stretching. Added Y raises with TB at tower for lower trap strengthening. Also added PNF with TB for improved strength. Ended with STM to reduce upper trap and other surrounding cervical mm restrictions.    Personal Factors and Comorbidities  Age;Comorbidity 3+    Examination-Activity Limitations  Lift    Examination-Participation Restrictions  Yard Work    Stability/Clinical Decision Making  Stable/Uncomplicated    Rehab Potential  Good    PT Frequency  2x / week    PT Duration  4 weeks    PT Treatment/Interventions  ADLs/Self Care Home Management;Aquatic Therapy;Cryotherapy;Electrical Stimulation;Moist Heat;Traction;Functional mobility training;Therapeutic activities;Ultrasound;Therapeutic exercise;Balance training;Neuromuscular re-education;Patient/family education;Orthotic Fit/Training;Manual techniques;Scar mobilization;Passive range of motion;Dry needling;Taping    PT Next Visit Plan  continue dry needling 1x/week. continue lower trap activation/strength; continue stretching, pec stretching, and progress postural training/strengthening, cervical and thoraic mobility work, manual for soft tissue restrictions and suboccipistal release    PT Home Exercise Plan  eval: seated scap retraction; 6/2: chin tucks (giving self double chin) in sitting; 6/23: corner pec stretch; 7/2: upper trap, levator scap stretching, seated thoracic rotation    Consulted and Agree with Plan of Care  Patient       Patient will benefit from skilled therapeutic intervention in order to improve the following deficits and impairments:  Decreased activity  tolerance, Decreased mobility, Decreased range of motion, Decreased strength, Hypomobility, Increased fascial restricitons, Increased muscle spasms, Impaired flexibility, Improper body mechanics, Postural dysfunction, Pain  Visit Diagnosis: 1. Cervicalgia   2. Abnormal posture   3. Other symptoms and signs involving the musculoskeletal system        Problem List Patient Active Problem List   Diagnosis Date Noted  . Intra-abdominal abscess ([place])   . Abscess of sigmoid colon due to diverticulitis   . Acute diverticulitis of intestine 12/27/2016  . Bowel perforation ([place])   . Perforation and abscess of large intestine concurrent with and due to diverticulitis 12/15/2016  . Blood in stool   . Second degree hemorrhoids   . Diverticulosis of large intestine without diverticulitis   . Essential hypertension, benign 02/03/2016  . Cervical spondylosis without myelopathy 01/22/2016  . Nocturnal leg cramps 07/24/2015  . Nontoxic multinodular goiter 01/31/2015  . Elevated transaminase level 10/26/2014  . Thrombocytopenia ([place]) 10/25/2014  . Myasthenia ([place]) 02/14/2013  . Convulsions/seizures ([place]) 02/14/2013       Geraldine Solar PT, [place] [address] [place], [place], [address] Phone: [phone]   Fax:  [phone]  Name: Kyle [name]. MRN: [id number] Date of Birth: [birth year]

## 2018-10-18 ENCOUNTER — Encounter (HOSPITAL_COMMUNITY): Payer: Self-pay

## 2018-10-18 ENCOUNTER — Other Ambulatory Visit: Payer: Self-pay

## 2018-10-18 ENCOUNTER — Encounter

## 2018-10-18 ENCOUNTER — Ambulatory Visit (HOSPITAL_COMMUNITY): Payer: Medicare Other

## 2018-10-18 DIAGNOSIS — R29898 Other symptoms and signs involving the musculoskeletal system: Secondary | ICD-10-CM | POA: Diagnosis not present

## 2018-10-18 DIAGNOSIS — R293 Abnormal posture: Secondary | ICD-10-CM

## 2018-10-18 DIAGNOSIS — M542 Cervicalgia: Secondary | ICD-10-CM | POA: Diagnosis not present

## 2018-10-18 NOTE — Therapy (Signed)
[place], [place], [address] Phone: [phone]   Fax:  [phone]  Physical Therapy Treatment  Patient Details  Name: Kyle [name]. MRN: [id number] Date of Birth: Oct 11, 1934 Referring Provider (PT): Melina Schools, MD   Encounter Date: 10/18/2018  PT End of Session - 10/18/18 0807    Visit Number  13    Number of Visits  16    Date for PT Re-Evaluation  10/21/18   corrected on 10/04/18   Authorization Type  [employer] Medicare    Authorization Time Period  06/28/18 to 07/26/18; NEW 09/06/18 to 10/07/18   corrected on 10/04/18   Authorization - Visit Number  11    Authorization - Number of Visits  16    PT Start Time  0815    PT Stop Time  0856    PT Time Calculation (min)  41 min    Activity Tolerance  Patient tolerated treatment well;No increased pain    Behavior During Therapy  WFL for tasks assessed/performed       Past Medical History:  Diagnosis Date  . Arthritis   . Auto immune neutropenia (HCC)   . Bullous pemphigoid   . Cancer [facility])    Prostate  . Cervical spondylosis without myelopathy 01/22/2016  . DVT (deep venous thrombosis) ([place])    a. Has had a previous right lower extremity DVT in the setting of hospitalization and surgery (after his brain tumor was resected in 1993) but is no longer on Coumadin.  Marland Kitchen Dyslipidemia   . History of prostate cancer   . Hypertension   . Meningioma ([place])    a. s/p resection 1990s.  . Myasthenia ([place]) 02/14/2013  . Nocturnal leg cramps   . Ocular myasthenia gravis ([place])   . Seizures ([place])   . Sinus bradycardia   . Sleep paralysis   . Thyroid nodule, cold     Past Surgical History:  Procedure Laterality Date  . BRAIN SURGERY    . CHOLECYSTECTOMY    . COLONOSCOPY N/A 07/25/2013   Procedure: COLONOSCOPY;  Surgeon: Jamesetta So, MD;  [facility];  Service: Gastroenterology;  Laterality: N/A;  . COLONOSCOPY N/A 11/10/2016   Procedure: COLONOSCOPY;   Surgeon: Aviva Signs, MD;  [facility];  Service: Gastroenterology;  Laterality: N/A;  . IR CATHETER TUBE CHANGE  01/02/2017  . IR RADIOLOGIST EVAL & MGMT  01/06/2017  . IR RADIOLOGIST EVAL & MGMT  01/20/2017  . IR RADIOLOGIST EVAL & MGMT  02/03/2017  . IR RADIOLOGIST EVAL & MGMT  02/17/2017  . IR SINUS/FIST TUBE CHK-NON GI  03/03/2017  . RADIOACTIVE SEED IMPLANT      There were no vitals filed for this visit.  Subjective Assessment - 10/18/18 0809    Subjective  Pt  states that he is doing well. Feels 70% better since starting therapy. Still just a little pain when moving his neck.    Limitations  Lifting;House hold activities    Diagnostic tests  x-rays    Patient Stated Goals  some degree of easing the pain    Currently in Pain?  No/denies    Pain Onset  1 to 4 weeks ago             [facility] Adult PT Treatment/Exercise - 10/18/18 0001      Neck Exercises: Standing   Other Standing Exercises  UE flexion (thumbs to wall) +cervical retraction x20 reps    Other  Standing Exercises  Y's on wall with liftoff x15 (cues for form)      Neck Exercises: Supine   Neck Retraction  15 reps;5 secs    Neck Retraction Limitations  into towel under chin, cues to reduce compensations - in hooklying position which helped reduce chest/shoulder compen      Manual Therapy   Manual Therapy  Soft tissue mobilization    Manual therapy comments  done seperate from all other activities of therapy     Soft tissue mobilization  STM after needling to upper trap, cervical paraspinals to reduce pain and restrictions with pt in supine       Trigger Point Dry Needling - 10/18/18 0001    Consent Given?  Yes    Education Handout Provided  Previously provided    Muscles Treated Head and Neck  Upper trapezius    Dry Needling Comments  bil in supine; R more twitches than L    Upper Trapezius Response  Twitch reponse elicited;Palpable increased muscle length           PT Education -  10/18/18 0808    Education Details  expect some soreness from needling potentially, exercise technique, will reassess next visit    Person(s) Educated  Patient    Methods  Explanation;Demonstration    Comprehension  Verbalized understanding;Returned demonstration       PT Short Term Goals - 10/06/18 1006      PT SHORT TERM GOAL #1   Title  Pt will have improved cervical AROM by 5deg throughout all in order to demo reduced restrictions and improve his overall function.     Baseline  10/06/18: Achieved in all except cervical flexion    Time  1    Period  Weeks    Status  Partially Met    Target Date  10/13/18      PT SHORT TERM GOAL #2   Title  Pt will report being able to participate in his regular workout routine with 3/10 neck pain or less afterwards to demo improved overall strength.    Baseline  10/06/18: Patient has not been able to return to his regular workout    Time  1    Period  Weeks    Status  On-going    Target Date  10/13/18        PT Long Term Goals - 10/06/18 1007      PT LONG TERM GOAL #1   Title  Pt will have improved cervical AROM by 10deg throughout flexion, extension, and bil rotation in order to further reduce pain and maximize his [facility] duties with greater ease.     Baseline  10/06/18: Achieved in some, but not all ROM    Time  2    Period  Weeks    Status  Partially Met    Target Date  10/20/18      PT LONG TERM GOAL #2   Title  Pt will report being able to perform yardwork with greater ease and with less neck pain following to demo improved strength and overall function at home.     Baseline  10/06/18: Patient reported an increase in neck pain following yardwork this week    Time  2    Period  Weeks    Status  On-going    Target Date  10/20/18      PT LONG TERM GOAL #3   Title  Pt will have reduced soft tissue restrictions to moderate throughout  cervical mm in order to reduce overall pain and maximize ROM.     Baseline  10/06/18: Patient with continued  soft tissue restrictions    Time  2    Period  Weeks    Status  On-going    Target Date  10/20/18            Plan - 10/18/18 0900    Clinical Impression Statement  Pt overall reporting gradual, steady progress with therapy, stating that he feels 70% improved; remaining 30% is that he still has a little bit of pain when moving his neck. Resumed needling this date to bil upper trap; R upper trap twitched better than L. Followed up with manual STM afterwards and postural training and strengthening. Cues to reduce shoulder and chest compensation. Pt due for reassessment next visit and likely d/c due to progress and pt preference.    Personal Factors and Comorbidities  Age;Comorbidity 3+    Examination-Activity Limitations  Lift    Examination-Participation Restrictions  Yard Work    Stability/Clinical Decision Making  Stable/Uncomplicated    Rehab Potential  Good    PT Frequency  2x / week    PT Duration  4 weeks    PT Treatment/Interventions  ADLs/Self Care Home Management;Aquatic Therapy;Cryotherapy;Electrical Stimulation;Moist Heat;Traction;Functional mobility training;Therapeutic activities;Ultrasound;Therapeutic exercise;Balance training;Neuromuscular re-education;Patient/family education;Orthotic Fit/Training;Manual techniques;Scar mobilization;Passive range of motion;Dry needling;Taping    PT Next Visit Plan  reassess and likely d/c; continue dry needling 1x/week. continue lower trap activation/strength; continue stretching, pec stretching, and progress postural training/strengthening, cervical and thoraic mobility work, manual for soft tissue restrictions and suboccipistal release    PT Home Exercise Plan  eval: seated scap retraction; 6/2: chin tucks (giving self double chin) in sitting; 6/23: corner pec stretch; 7/2: upper trap, levator scap stretching, seated thoracic rotation; 7/7: supine chin tucks with rag    Consulted and Agree with Plan of Care  Patient       Patient will  benefit from skilled therapeutic intervention in order to improve the following deficits and impairments:  Decreased activity tolerance, Decreased mobility, Decreased range of motion, Decreased strength, Hypomobility, Increased fascial restricitons, Increased muscle spasms, Impaired flexibility, Improper body mechanics, Postural dysfunction, Pain  Visit Diagnosis: 1. Cervicalgia   2. Abnormal posture   3. Other symptoms and signs involving the musculoskeletal system        Problem List Patient Active Problem List   Diagnosis Date Noted  . Intra-abdominal abscess ([place])   . Abscess of sigmoid colon due to diverticulitis   . Acute diverticulitis of intestine 12/27/2016  . Bowel perforation ([place])   . Perforation and abscess of large intestine concurrent with and due to diverticulitis 12/15/2016  . Blood in stool   . Second degree hemorrhoids   . Diverticulosis of large intestine without diverticulitis   . Essential hypertension, benign 02/03/2016  . Cervical spondylosis without myelopathy 01/22/2016  . Nocturnal leg cramps 07/24/2015  . Nontoxic multinodular goiter 01/31/2015  . Elevated transaminase level 10/26/2014  . Thrombocytopenia ([place]) 10/25/2014  . Myasthenia ([place]) 02/14/2013  . Convulsions/seizures ([place]) 02/14/2013       Geraldine Solar PT, [place] [address] [place], [place], [address] Phone: [phone]   Fax:  [phone]  Name: Kendal Raffo. MRN: [id number] Date of Birth: 09-14-1934

## 2018-10-18 NOTE — Progress Notes (Signed)
PATIENT: Kyle [name]. DOB: Jul 31, 1934  REASON FOR VISIT: follow up HISTORY FROM: patient  HISTORY OF PRESENT ILLNESS: Today 10/19/18  Kyle [name] is an 84 year old male with history of myasthenia gravis with ocular features and seizures.  We restarted prednisone in April 2020, for reports of ptosis in his right eye and generalized leg weakness.  His Mestinon was increased to 90 mg 4 times daily.  He is also taking 600 mg gabapentin for pain in his legs at bedtime.  He reports he is no longer having ptosis or any double vision.  The increase in his gabapentin to 600 mg at bedtime, has been beneficial for his leg cramps.  He reports over the last few months, he had worsening leg pain/ache, burning in his feet that is worse at night.  When he gets up in the morning, it gets better when he walks.  He is very active.  He has a large garden, manages 3 lawns.  He has not had recurrent seizure.  He says he is doing some physical therapy for his neck pain.  He is not diabetic.  He has not stopped doing any of his normal activities, with the exception of going on ladders.  He has not had any falls.  He denies difficulty swallowing.  He indicates he is tolerating the Mestinon, at times he may have diarrhea but it is related to his diet.  He presents today for follow-up unaccompanied.    HISTORY 07/19/2018 SS: Kyle [name] is a 84 year old male with history of myasthenia gravis with ocular features. He has come off prednisone in May 2019. He remains on mestinon 90 mg three times daily. He remains on Keppra 250 mg in the morning, 500 mg at bedtime for seizure history. He has not had any seizure events.  I talked to Kyle [name] on the phone today.  He reports that he has been doing fairly well.  He does report that he is having more ptosis whenever he is reading, worse in the right eye.  He reports that he will have to open his eyelids to continue reading.  He does find the Mestinon to be some beneficial.  He  denies any weakness in his hands or arms.  He denies any difficulty swallowing.  He denies any diplopia.  He continues to report generalized leg weakness, feels like for the last year his legs have become weaker.  He reports that he will avoid stairs, climbing ladders because his legs feel so weak.  He says that in general he is a very active 84 year old, still manages 3 yards, and has in April 2020 garden.  He is still taking gabapentin 400 mg at bedtime for pain in his legs.  He feels the medication is wearing off because he will wake up in the middle the night with leg pain.  REVIEW OF SYSTEMS: Out of a complete 14 system review of symptoms, the patient complains only of the following symptoms, and all other reviewed systems are negative.  Leg pain  ALLERGIES: Allergies  Allergen Reactions  . Iodinated Diagnostic Agents Hives     pt was premedicated/hives reaction occurred 24 hours post injection, Onset Date: [id number]   . Sulfa Antibiotics Swelling and Rash  . Bactericin [Bacitracin] Other (See Comments)    Unknown reaction     HOME MEDICATIONS: Outpatient Medications Prior to Visit  Medication Sig Dispense Refill  . clobetasol (TEMOVATE) 0.05 % external solution Apply 1 application topically daily as  needed (rash).     . diclofenac (VOLTAREN) 75 MG EC tablet TAKE 1 TABLET BY MOUTH TWO  TIMES DAILY 180 tablet 1  . fish oil-omega-3 fatty acids 1000 MG capsule Take 1 g by mouth 3 (three) times daily.    Marland Kitchen gabapentin (NEURONTIN) 600 MG tablet Take 1 tablet (600 mg total) by mouth daily. 90 tablet 3  . Garlic (GARLIQUE PO) Take 1 tablet by mouth daily.    Marland Kitchen levETIRAcetam (KEPPRA) 500 MG tablet TAKE 1/2 TABLET BY MOUTH IN THE MORNING AND 1 TABLET IN THE EVENING 135 tablet 3  . losartan (COZAAR) 25 MG tablet Take 25 mg by mouth at bedtime.     . magnesium oxide (MAGOX 400) 400 (241.3 Mg) MG tablet Take 800 mg by mouth daily.    Marland Kitchen MYRBETRIQ 50 MG TB24 tablet Take 50 mg by mouth daily.    .  niacinamide 500 MG tablet Take 500 mg by mouth 3 (three) times daily.    Vladimir Faster Glycol-Propyl Glycol (SYSTANE) 0.4-0.3 % SOLN Place 1 drop into both eyes 3 (three) times daily as needed (for dry eyes).    . predniSONE (DELTASONE) 5 MG tablet Take 1 tablet (5 mg total) by mouth daily with breakfast. 30 tablet 5  . pyridostigmine (MESTINON) 60 MG tablet Take 1.5 tablets (90 mg total) by mouth 4 (four) times daily. 504 tablet 3  . simvastatin (ZOCOR) 20 MG tablet Take 20 mg by mouth at bedtime.      No facility-administered medications prior to visit.     PAST MEDICAL HISTORY: Past Medical History:  Diagnosis Date  . Arthritis   . Auto immune neutropenia (HCC)   . Bullous pemphigoid   . Cancer [facility])    Prostate  . Cervical spondylosis without myelopathy 01/22/2016  . DVT (deep venous thrombosis) ([place])    a. Has had a previous right lower extremity DVT in the setting of hospitalization and surgery (after his brain tumor was resected in 1993) but is no longer on Coumadin.  Marland Kitchen Dyslipidemia   . History of prostate cancer   . Hypertension   . Meningioma ([place])    a. s/p resection 1990s.  . Myasthenia ([place]) 02/14/2013  . Nocturnal leg cramps   . Ocular myasthenia gravis ([place])   . Seizures ([place])   . Sinus bradycardia   . Sleep paralysis   . Thyroid nodule, cold     PAST SURGICAL HISTORY: Past Surgical History:  Procedure Laterality Date  . BRAIN SURGERY    . CHOLECYSTECTOMY    . COLONOSCOPY N/A 07/25/2013   Procedure: COLONOSCOPY;  Surgeon: Jamesetta So, MD;  [facility];  Service: Gastroenterology;  Laterality: N/A;  . COLONOSCOPY N/A 11/10/2016   Procedure: COLONOSCOPY;  Surgeon: Aviva Signs, MD;  [facility];  Service: Gastroenterology;  Laterality: N/A;  . IR CATHETER TUBE CHANGE  01/02/2017  . IR RADIOLOGIST EVAL & MGMT  01/06/2017  . IR RADIOLOGIST EVAL & MGMT  01/20/2017  . IR RADIOLOGIST EVAL & MGMT  02/03/2017  . IR RADIOLOGIST EVAL & MGMT   02/17/2017  . IR SINUS/FIST TUBE CHK-NON GI  03/03/2017  . RADIOACTIVE SEED IMPLANT      FAMILY HISTORY: Family History  Problem Relation Age of Onset  . Heart failure Mother   . Diabetes Mother   . Diabetes Sister   . Dementia Sister   . Lung cancer Son   . Heart failure Daughter   . Colon cancer Neg  Hx     SOCIAL HISTORY: Social History   Socioeconomic History  . Marital status: Married    Spouse name: Not on file  . Number of children: 1  . Years of education: 23 TH  . Highest education level: Not on file  Occupational History  . Occupation: retired  Scientific laboratory technician  . Financial resource strain: Not on file  . Food insecurity    Worry: Not on file    Inability: Not on file  . Transportation needs    Medical: Not on file    Non-medical: Not on file  Tobacco Use  . Smoking status: Never Smoker  . Smokeless tobacco: Never Used  Substance and Sexual Activity  . Alcohol use: No  . Drug use: No  . Sexual activity: Not on file  Lifestyle  . Physical activity    Days per week: Not on file    Minutes per session: Not on file  . Stress: Not on file  Relationships  . Social Herbalist on phone: Not on file    Gets together: Not on file    Attends religious service: Not on file    Active member of club or organization: Not on file    Attends meetings of clubs or organizations: Not on file    Relationship status: Not on file  . Intimate partner violence    Fear of current or ex partner: Not on file    Emotionally abused: Not on file    Physically abused: Not on file    Forced sexual activity: Not on file  Other Topics Concern  . Not on file  Social History Narrative   Patient is right handed.   Patient drinks 1 cup of coffee daily.      PHYSICAL EXAM  Vitals:   10/19/18 0748  BP: (!) 141/71  Pulse: (!) 54  Temp: 98.1 F (36.7 C)  TempSrc: Oral  Weight: 170 lb (77.1 kg)  Height: 5\' 8"  (1.727 m)   Body mass index is 25.85 kg/m.   Generalized: Well developed, in no acute distress   Neurological examination  Mentation: Alert oriented to time, place, history taking. Follows all commands speech and language fluent Cranial nerve II-XII: Pupils were equal round reactive to light. Extraocular movements were full, visual field were full on confrontational test.  No ptosis noted.  With superior gaze for 1 minute, no ptosis. facial sensation and strength were normal. Uvula tongue midline. Head turning and shoulder shrug  were normal and symmetric. Motor: The motor testing reveals 5 over 5 strength of all 4 extremities. Good symmetric motor tone is noted throughout.  Sensory: Sensory testing is intact to soft touch, pinprick, vibration on all 4 extremities. No evidence of extinction is noted.  Coordination: Cerebellar testing reveals good finger-nose-finger and heel-to-shin bilaterally.  Gait and station: Gait is normal. Tandem gait is mildly impaired.  Romberg is negative. No drift is seen.  Reflexes: Deep tendon reflexes are symmetric and normal bilaterally.   DIAGNOSTIC DATA (LABS, IMAGING, TESTING) - I reviewed patient records, labs, notes, testing and imaging myself where available.  Lab Results  Component Value Date   WBC 9.2 03/03/2017   HGB 13.6 03/03/2017   HCT 40.4 03/03/2017   MCV 93.1 03/03/2017   PLT 192 03/03/2017      Component Value Date/Time   NA 141 03/03/2017 1024   K 3.9 03/03/2017 1024   CL 106 03/03/2017 1024   CO2 27 03/03/2017 1024  GLUCOSE 89 03/03/2017 1024   BUN 20 03/03/2017 1024   CREATININE 0.68 03/03/2017 1024   CALCIUM 9.6 03/03/2017 1024   PROT 7.8 03/03/2017 1024   ALBUMIN 4.1 03/03/2017 1024   AST 22 03/03/2017 1024   ALT 19 03/03/2017 1024   ALKPHOS 59 03/03/2017 1024   BILITOT 0.9 03/03/2017 1024   GFRNONAA >60 03/03/2017 1024   GFRAA >60 03/03/2017 1024   Lab Results  Component Value Date   CHOL 182 10/26/2014   HDL 67 10/26/2014   LDLCALC 89 10/26/2014   TRIG 130  10/26/2014   CHOLHDL 2.7 10/26/2014   Lab Results  Component Value Date   HGBA1C 5.9 (H) 10/25/2014   Lab Results  Component Value Date   VITAMINB12 327 10/26/2014   Lab Results  Component Value Date   TSH 1.940 05/04/2018      ASSESSMENT AND PLAN 84 y.o. year old male  has a past medical history of Arthritis, Auto immune neutropenia ([place]), Bullous pemphigoid, Cancer ([place]), Cervical spondylosis without myelopathy (01/22/2016), DVT (deep venous thrombosis) ([place]), Dyslipidemia, History of prostate cancer, Hypertension, Meningioma ([place]), Myasthenia ([place]) (02/14/2013), Nocturnal leg cramps, Ocular myasthenia gravis ([place]), Seizures ([place]), Sinus bradycardia, Sleep paralysis, and Thyroid nodule, cold. here with:  1. Myasthenia Gravis, ocular  2. Bilateral leg pain, burning  He indicates that his ptosis has improved with prednisone.  He no longer complains of ptosis or double vision.  For this reason, we will try to decrease his prednisone dose to 2.5 mg daily for 2 months.  After that, he will call and let us know how he is doing.  If he continues to do well, we may be able to discontinue the prednisone.  He will continue taking Mestinon 90 mg 4 times a day.  Today, he complains of worsening bilateral pain in his legs and feet, burning that occurs at night, improves in the morning with walking.  He denies back pain, or diabetes.  He will be set up for EMG nerve conduction study.  He will continue taking gabapentin 600 mg at bedtime for nocturnal leg cramps, leg pain.  In the future, we may consider the addition of baclofen for leg cramps if they become more significant.  He has not had recurrent seizure, will continue taking Keppra.  Overall, he is a very pleasant 84 year old, is very active.    I spent 15 minutes with the patient. 50% of this time was spent discussing his plan of care   Evangeline Dakin, DNP 10/19/2018, 7:51 AM [facility] [address], [place]  [place], [place] [address] [phone]

## 2018-10-19 ENCOUNTER — Encounter: Payer: Self-pay | Admitting: Neurology

## 2018-10-19 ENCOUNTER — Ambulatory Visit: Payer: Medicare Other | Admitting: Neurology

## 2018-10-19 VITALS — BP 141/71 | HR 54 | Temp 98.1°F | Ht 68.0 in | Wt 170.0 lb

## 2018-10-19 DIAGNOSIS — G7000 Myasthenia gravis without (acute) exacerbation: Secondary | ICD-10-CM | POA: Diagnosis not present

## 2018-10-19 DIAGNOSIS — G4762 Sleep related leg cramps: Secondary | ICD-10-CM

## 2018-10-19 MED ORDER — LEVETIRACETAM 500 MG PO TABS: ORAL_TABLET | ORAL | 3 refills | Status: DC

## 2018-10-19 MED ORDER — PREDNISONE 2.5 MG PO TABS: 2.5000 mg | ORAL_TABLET | Freq: Every day | ORAL | 3 refills | Status: DC

## 2018-10-19 NOTE — Patient Instructions (Signed)
Start taking Prednisone 2.5 mg daily x 2 months. Then call us and let us know how you are doing in regards to the droopy eyelid and weakness. I will order EMG nerve conduction study today to evaluate you leg pain, burning in the legs are night.

## 2018-10-19 NOTE — Progress Notes (Signed)
I have read the note, and I agree with the clinical assessment and plan.  Charles K Willis   

## 2018-10-20 ENCOUNTER — Encounter (HOSPITAL_COMMUNITY): Payer: Self-pay

## 2018-10-20 ENCOUNTER — Ambulatory Visit (HOSPITAL_COMMUNITY): Payer: Medicare Other

## 2018-10-20 ENCOUNTER — Other Ambulatory Visit: Payer: Self-pay

## 2018-10-20 DIAGNOSIS — R293 Abnormal posture: Secondary | ICD-10-CM

## 2018-10-20 DIAGNOSIS — R29898 Other symptoms and signs involving the musculoskeletal system: Secondary | ICD-10-CM

## 2018-10-20 DIAGNOSIS — M542 Cervicalgia: Secondary | ICD-10-CM | POA: Diagnosis not present

## 2018-10-20 NOTE — Therapy (Signed)
[facility] [address] [place], [place], [address] Phone: [phone]   Fax:  [phone]   PHYSICAL THERAPY DISCHARGE SUMMARY  Visits from Start of Care: 14  Current functional level related to goals / functional outcomes: See below   Remaining deficits: See below   Education / Equipment: Updated HEP  Plan: Patient agrees to discharge.  Patient goals were partially met. Patient is being discharged due to being pleased with the current functional level.  ?????    Physical Therapy Treatment  Patient Details  Name: Kyle [name]. MRN: [id number] Date of Birth: 28-Mar-1935 Referring Provider (PT): Melina Schools, MD   Encounter Date: 10/20/2018  PT End of Session - 10/20/18 1013    Visit Number  14    Number of Visits  16    Date for PT Re-Evaluation  10/21/18   corrected on 10/04/18   Authorization Type  [employer] Medicare    Authorization Time Period  06/28/18 to 07/26/18; NEW 09/06/18 to 10/07/18   corrected on 10/04/18   Authorization - Visit Number  12    Authorization - Number of Visits  16    PT Start Time  7035    PT Stop Time  1038    PT Time Calculation (min)  23 min    Activity Tolerance  Patient tolerated treatment well;No increased pain    Behavior During Therapy  WFL for tasks assessed/performed       Past Medical History:  Diagnosis Date  . Arthritis   . Auto immune neutropenia (HCC)   . Bullous pemphigoid   . Cancer [facility])    Prostate  . Cervical spondylosis without myelopathy 01/22/2016  . DVT (deep venous thrombosis) ([place])    a. Has had a previous right lower extremity DVT in the setting of hospitalization and surgery (after his brain tumor was resected in 1993) but is no longer on Coumadin.  Marland Kitchen Dyslipidemia   . History of prostate cancer   . Hypertension   . Meningioma ([place])    a. s/p resection 1990s.  . Myasthenia ([place]) 02/14/2013  . Nocturnal leg cramps   . Ocular myasthenia gravis ([place])   . Seizures  ([place])   . Sinus bradycardia   . Sleep paralysis   . Thyroid nodule, cold     Past Surgical History:  Procedure Laterality Date  . BRAIN SURGERY    . CHOLECYSTECTOMY    . COLONOSCOPY N/A 07/25/2013   Procedure: COLONOSCOPY;  Surgeon: Jamesetta So, MD;  [facility];  Service: Gastroenterology;  Laterality: N/A;  . COLONOSCOPY N/A 11/10/2016   Procedure: COLONOSCOPY;  Surgeon: Aviva Signs, MD;  [facility];  Service: Gastroenterology;  Laterality: N/A;  . IR CATHETER TUBE CHANGE  01/02/2017  . IR RADIOLOGIST EVAL & MGMT  01/06/2017  . IR RADIOLOGIST EVAL & MGMT  01/20/2017  . IR RADIOLOGIST EVAL & MGMT  02/03/2017  . IR RADIOLOGIST EVAL & MGMT  02/17/2017  . IR SINUS/FIST TUBE CHK-NON GI  03/03/2017  . RADIOACTIVE SEED IMPLANT      There were no vitals filed for this visit.  Subjective Assessment - 10/20/18 1014    Subjective  Pt reports that he's doing well. Still just a little bit of pain when moving neck.    Limitations  Lifting;House hold activities    Diagnostic tests  x-rays    Patient Stated Goals  some degree of easing the pain    Currently in  Pain?  No/denies    Pain Onset  1 to 4 weeks ago         [facility] PT Assessment - 10/20/18 0001      Assessment   Medical Diagnosis  degeneration of cervical intervertebral disc    Referring Provider (PT)  Melina Schools, MD    Hand Dominance  Right    Next MD Visit  after therapy if needed    Prior Therapy  none      Observation/Other Assessments   Focus on Therapeutic Outcomes (FOTO)   --   was 35% limited     AROM   Cervical Flexion  43   was 40   Cervical Extension  55   was 55   Cervical - Right Side Bend  31   was 38   Cervical - Left Side Bend  33   was 35   Cervical - Right Rotation  66   was 71   Cervical - Left Rotation  67   was 70     Palpation   Palpation comment  mod to max restrictions in bil upper trap, levator scap, cervical paraspinals, and suboccipitals; palpation to  levator scap, SCM, and paraspinals recreated same pain but but much reduced pain with palpation   was max restrictions           PT Education - 10/20/18 1014    Education Details  reassessment and d/c plans    Person(s) Educated  Patient    Methods  Explanation;Demonstration;Handout    Comprehension  Verbalized understanding;Returned demonstration       PT Short Term Goals - 10/20/18 1017      PT SHORT TERM GOAL #1   Title  Pt will have improved cervical AROM by 5deg throughout all in order to demo reduced restrictions and improve his overall function.     Baseline  7/9: see ROM    Time  1    Period  Weeks    Status  Partially Met    Target Date  10/13/18      PT SHORT TERM GOAL #2   Title  Pt will report being able to participate in his regular workout routine with 3/10 neck pain or less afterwards to demo improved overall strength.    Baseline  7/9: reports compliance with regular exercise routine with 3/10 neck pain afterwards    Time  1    Period  Weeks    Status  Achieved    Target Date  10/13/18        PT Long Term Goals - 10/20/18 1017      PT LONG TERM GOAL #1   Title  Pt will have improved cervical AROM by 10deg throughout flexion, extension, and bil rotation in order to further reduce pain and maximize his [facility] duties with greater ease.     Baseline  7/9: see ROM    Time  2    Period  Weeks    Status  Partially Met      PT LONG TERM GOAL #2   Title  Pt will report being able to perform yardwork with greater ease and with less neck pain following to demo improved strength and overall function at home.     Baseline  7/9: able to perform yardwork with greater ease and reports less pain following    Time  2    Period  Weeks    Status  Achieved      PT LONG  TERM GOAL #3   Title  Pt will have reduced soft tissue restrictions to moderate throughout cervical mm in order to reduce overall pain and maximize ROM.     Baseline  7/9: mod-max restrictions throughout,  mild tenderness/recreation of pain    Time  2    Period  Weeks    Status  Partially Met            Plan - 10/20/18 1054    Clinical Impression Statement  PT reassessed pt's goals and outcome measures this date. Pt has met or partially met all STG and LTG AEB. His AROM has maintained relatively well since his last reassessment. He reports less pain following yardwork and ability to perform his regular exercise routine with less pain afterwards. Pt is not limited by his neck pain with his functional ability. Pt is ready for d/c at this time. Updated HEP to include further postural strength and thoracic mobility work and pt verbalized understanding.    Personal Factors and Comorbidities  Age;Comorbidity 3+    Examination-Activity Limitations  Lift    Examination-Participation Restrictions  Yard Work    Stability/Clinical Decision Making  Stable/Uncomplicated    Rehab Potential  Good    PT Frequency  2x / week    PT Duration  4 weeks    PT Treatment/Interventions  ADLs/Self Care Home Management;Aquatic Therapy;Cryotherapy;Electrical Stimulation;Moist Heat;Traction;Functional mobility training;Therapeutic activities;Ultrasound;Therapeutic exercise;Balance training;Neuromuscular re-education;Patient/family education;Orthotic Fit/Training;Manual techniques;Scar mobilization;Passive range of motion;Dry needling;Taping    PT Next Visit Plan  d/c to HEP    PT Home Exercise Plan  eval: seated scap retraction; 6/2: chin tucks (giving self double chin) in sitting; 6/23: corner pec stretch; 7/2: upper trap, levator scap stretching, seated thoracic rotation; 7/7: supine chin tucks with rag; 7/9: seated thoracic ext, w back, scap retraction and GH ext R/GTB, horiz abd R/GTB    Consulted and Agree with Plan of Care  Patient       Patient will benefit from skilled therapeutic intervention in order to improve the following deficits and impairments:  Decreased activity tolerance, Decreased mobility,  Decreased range of motion, Decreased strength, Hypomobility, Increased fascial restricitons, Increased muscle spasms, Impaired flexibility, Improper body mechanics, Postural dysfunction, Pain  Visit Diagnosis: 1. Cervicalgia   2. Abnormal posture   3. Other symptoms and signs involving the musculoskeletal system        Problem List Patient Active Problem List   Diagnosis Date Noted  . Intra-abdominal abscess ([place])   . Abscess of sigmoid colon due to diverticulitis   . Acute diverticulitis of intestine 12/27/2016  . Bowel perforation ([place])   . Perforation and abscess of large intestine concurrent with and due to diverticulitis 12/15/2016  . Blood in stool   . Second degree hemorrhoids   . Diverticulosis of large intestine without diverticulitis   . Essential hypertension, benign 02/03/2016  . Cervical spondylosis without myelopathy 01/22/2016  . Nocturnal leg cramps 07/24/2015  . Nontoxic multinodular goiter 01/31/2015  . Elevated transaminase level 10/26/2014  . Thrombocytopenia ([place]) 10/25/2014  . Myasthenia ([place]) 02/14/2013  . Convulsions/seizures ([place]) 02/14/2013      Geraldine Solar PT, [facility] [address] [place], [place], [address] Phone: [phone]   Fax:  [phone]  Name: Clydell Sposito. MRN: [id number] Date of Birth: 1935-01-13

## 2018-10-24 ENCOUNTER — Ambulatory Visit (HOSPITAL_COMMUNITY): Payer: Medicare Other

## 2018-10-26 ENCOUNTER — Encounter (HOSPITAL_COMMUNITY): Payer: Medicare Other

## 2018-11-23 ENCOUNTER — Ambulatory Visit (INDEPENDENT_AMBULATORY_CARE_PROVIDER_SITE_OTHER): Payer: Medicare Other | Admitting: Neurology

## 2018-11-23 ENCOUNTER — Encounter: Payer: Self-pay | Admitting: Neurology

## 2018-11-23 ENCOUNTER — Other Ambulatory Visit: Payer: Self-pay

## 2018-11-23 DIAGNOSIS — G629 Polyneuropathy, unspecified: Secondary | ICD-10-CM

## 2018-11-23 DIAGNOSIS — E538 Deficiency of other specified B group vitamins: Secondary | ICD-10-CM | POA: Diagnosis not present

## 2018-11-23 DIAGNOSIS — G609 Hereditary and idiopathic neuropathy, unspecified: Secondary | ICD-10-CM

## 2018-11-23 DIAGNOSIS — G4762 Sleep related leg cramps: Secondary | ICD-10-CM | POA: Diagnosis not present

## 2018-11-23 HISTORY — DX: Polyneuropathy, unspecified: G62.9

## 2018-11-23 NOTE — Progress Notes (Signed)
Please refer to EMG and nerve conduction procedure note.  

## 2018-11-23 NOTE — Progress Notes (Signed)
The patient comes in today for EMG and nerve conduction study evaluation.  Nerve conduction studies show what appears to be primarily of motor neuropathy but the EMG evaluation of the right lower extremity shows distal acute and chronic denervation consistent with a typical peripheral neuropathy.  There is no evidence of an overlying lumbosacral radiculopathy.  The patient will be sent for blood work today.  He does report some issues with left hip pain and some back pain, this comes on after a period of inactivity, when he first gets up he has discomfort but if he keeps walking the pain improves.  The patient may have intrinsic left hip disease or a facet joint arthritis in the back causing some hip discomfort.

## 2018-11-23 NOTE — Procedures (Signed)
     HISTORY:  Kyle [name] is an 84 year old gentleman with reports of some discomfort in the legs at nighttime, he sometimes has to get up to walk to reduce the discomfort.  He more recently has developed some low back pain and some left hip discomfort.  He is being evaluated for a possible neuropathy or a lumbosacral radiculopathy.  NERVE CONDUCTION STUDIES:  Nerve conduction studies were performed on both lower extremities.  The distal motor latencies for the peroneal and posterior tibial nerves were within normal limits bilaterally but the right peroneal and the posterior tibial nerves bilaterally revealed low motor amplitudes.  The motor amplitude for the left peroneal nerve was normal.  Slowing was seen for the posterior tibial nerves bilaterally, normal for the peroneal nerves bilaterally.  Prolongation of the left peroneal sensory latency was noted, normal for the right peroneal nerve and for the sural nerves bilaterally.  The F-wave latencies for the posterior tibial nerves were prolonged bilaterally.  EMG STUDIES:  EMG study was performed on the right lower extremity:  The tibialis anterior muscle reveals 2 to 5K motor units with decreased recruitment. No fibrillations or positive waves were seen.  1+ fasciculations were seen. The peroneus tertius muscle reveals 2 to 5K motor units with decreased recruitment. No fibrillations or positive waves were seen. The medial gastrocnemius muscle reveals 1 to 4K motor units with decreased recruitment. 1-2+ fibrillations and positive waves were seen. The vastus lateralis muscle reveals 2 to 4K motor units with full recruitment. No fibrillations or positive waves were seen. The iliopsoas muscle reveals 2 to 4K motor units with full recruitment. No fibrillations or positive waves were seen. The biceps femoris muscle (long head) reveals 2 to 4K motor units with full recruitment. No fibrillations or positive waves were seen. The lumbosacral  paraspinal muscles were tested at 3 levels, and revealed no abnormalities of insertional activity at all 3 levels tested. There was good relaxation.   IMPRESSION:  Nerve conduction studies done on both lower extremities shows findings most consistent with a primarily motor peripheral neuropathy.  EMG evaluation of the right lower extremity shows distal chronic and acute signs of neuropathic denervation that are consistent with a peripheral neuropathy, there is no clear evidence of an overlying lumbosacral radiculopathy.  Jill Alexanders MD 11/23/2018 3:17 PM  [facility] [address] [place], [place] [address]  Phone [phone] Fax [phone]

## 2018-11-24 DIAGNOSIS — L120 Bullous pemphigoid: Secondary | ICD-10-CM | POA: Diagnosis not present

## 2018-11-24 DIAGNOSIS — L821 Other seborrheic keratosis: Secondary | ICD-10-CM | POA: Diagnosis not present

## 2018-11-24 DIAGNOSIS — L570 Actinic keratosis: Secondary | ICD-10-CM | POA: Diagnosis not present

## 2018-11-24 DIAGNOSIS — Z1283 Encounter for screening for malignant neoplasm of skin: Secondary | ICD-10-CM | POA: Diagnosis not present

## 2018-11-24 DIAGNOSIS — L820 Inflamed seborrheic keratosis: Secondary | ICD-10-CM | POA: Diagnosis not present

## 2018-11-24 DIAGNOSIS — X32XXXD Exposure to sunlight, subsequent encounter: Secondary | ICD-10-CM | POA: Diagnosis not present

## 2018-11-25 LAB — ANA W/REFLEX: Anti Nuclear Antibody (ANA): NEGATIVE

## 2018-11-25 LAB — ANGIOTENSIN CONVERTING ENZYME: Angio Convert Enzyme: 32 U/L (ref 14–82)

## 2018-11-25 LAB — MULTIPLE MYELOMA PANEL, SERUM
Albumin SerPl Elph-Mcnc: 4.3 g/dL (ref 2.9–4.4)
Albumin/Glob SerPl: 1.7 (ref 0.7–1.7)
Alpha 1: 0.2 g/dL (ref 0.0–0.4)
Alpha2 Glob SerPl Elph-Mcnc: 0.9 g/dL (ref 0.4–1.0)
B-Globulin SerPl Elph-Mcnc: 0.8 g/dL (ref 0.7–1.3)
Gamma Glob SerPl Elph-Mcnc: 0.7 g/dL (ref 0.4–1.8)
Globulin, Total: 2.6 g/dL (ref 2.2–3.9)
IgA/Immunoglobulin A, Serum: 195 mg/dL (ref 61–437)
IgG (Immunoglobin G), Serum: 947 mg/dL (ref 603–1613)
IgM (Immunoglobulin M), Srm: 46 mg/dL (ref 15–143)
Total Protein: 6.9 g/dL (ref 6.0–8.5)

## 2018-11-25 LAB — VITAMIN B12: Vitamin B-12: 292 pg/mL (ref 232–1245)

## 2018-11-25 LAB — B. BURGDORFI ANTIBODIES: Lyme IgG/IgM Ab: <0.91 {ISR} (ref 0.00–0.90)

## 2018-11-25 LAB — RHEUMATOID FACTOR: Rhuematoid fact SerPl-aCnc: <10 IU/mL (ref 0.0–13.9)

## 2018-11-25 LAB — SEDIMENTATION RATE: Sed Rate: 2 mm/hr (ref 0–30)

## 2018-11-29 ENCOUNTER — Telehealth: Payer: Self-pay

## 2018-11-29 NOTE — Telephone Encounter (Signed)
-----   Message from Kathrynn Ducking, MD sent at 11/26/2018  5:04 PM EDT -----  The blood work results are unremarkable. Please call the patient. ----- Message ----- From: Lavone Neri Lab Results In Sent: 11/24/2018   7:37 AM EDT To: Kathrynn Ducking, MD

## 2018-11-29 NOTE — Telephone Encounter (Signed)
I reached out to the pt and advised of the results per CKW. Pt verbalized understanding and had no questions at this time.

## 2018-12-21 ENCOUNTER — Telehealth: Payer: Self-pay | Admitting: Neurology

## 2018-12-21 NOTE — Telephone Encounter (Signed)
I called pt and relayed that SS/NP got message and was glad he was doing well. If he is develops sx of ptosis, other MG sx to let us know immediately and most likely would restart prednisone. He verbalized understanding.

## 2018-12-21 NOTE — Telephone Encounter (Signed)
Please call the patient.  Glad to hear that he is doing well after stopping prednisone.  Please discuss that if he develops any symptoms of ptosis or double vision or other symptoms of worsening myasthenia gravis he should let us know immediately, would likely need to restart prednisone.

## 2018-12-21 NOTE — Telephone Encounter (Signed)
Pt called wanting to inform the NP that he has stopped the predniSONE (DELTASONE) 2.5 MG tablet as advised and he is doing well so far.

## 2018-12-27 ENCOUNTER — Other Ambulatory Visit: Payer: Self-pay | Admitting: Neurology

## 2019-01-11 DIAGNOSIS — I10 Essential (primary) hypertension: Secondary | ICD-10-CM | POA: Diagnosis not present

## 2019-01-11 DIAGNOSIS — N183 Chronic kidney disease, stage 3 (moderate): Secondary | ICD-10-CM | POA: Diagnosis not present

## 2019-01-11 DIAGNOSIS — E7849 Other hyperlipidemia: Secondary | ICD-10-CM | POA: Diagnosis not present

## 2019-01-11 DIAGNOSIS — I129 Hypertensive chronic kidney disease with stage 1 through stage 4 chronic kidney disease, or unspecified chronic kidney disease: Secondary | ICD-10-CM | POA: Diagnosis not present

## 2019-02-09 ENCOUNTER — Other Ambulatory Visit: Payer: Self-pay | Admitting: Neurology

## 2019-02-11 DIAGNOSIS — E7849 Other hyperlipidemia: Secondary | ICD-10-CM | POA: Diagnosis not present

## 2019-02-11 DIAGNOSIS — I129 Hypertensive chronic kidney disease with stage 1 through stage 4 chronic kidney disease, or unspecified chronic kidney disease: Secondary | ICD-10-CM | POA: Diagnosis not present

## 2019-02-11 DIAGNOSIS — N183 Chronic kidney disease, stage 3 unspecified: Secondary | ICD-10-CM | POA: Diagnosis not present

## 2019-02-11 DIAGNOSIS — M1991 Primary osteoarthritis, unspecified site: Secondary | ICD-10-CM | POA: Diagnosis not present

## 2019-02-12 NOTE — Telephone Encounter (Signed)
I will refill Diclofenac. He has been given this through our office for arthritis.

## 2019-02-21 DIAGNOSIS — R6889 Other general symptoms and signs: Secondary | ICD-10-CM | POA: Diagnosis not present

## 2019-02-22 ENCOUNTER — Ambulatory Visit: Payer: Medicare Other | Admitting: Neurology

## 2019-02-22 ENCOUNTER — Other Ambulatory Visit: Payer: Self-pay

## 2019-02-22 ENCOUNTER — Encounter: Payer: Self-pay | Admitting: Neurology

## 2019-02-22 VITALS — BP 133/73 | HR 53 | Temp 97.6°F | Wt 169.3 lb

## 2019-02-22 DIAGNOSIS — G4762 Sleep related leg cramps: Secondary | ICD-10-CM

## 2019-02-22 DIAGNOSIS — G609 Hereditary and idiopathic neuropathy, unspecified: Secondary | ICD-10-CM

## 2019-02-22 DIAGNOSIS — R569 Unspecified convulsions: Secondary | ICD-10-CM | POA: Diagnosis not present

## 2019-02-22 DIAGNOSIS — G7000 Myasthenia gravis without (acute) exacerbation: Secondary | ICD-10-CM

## 2019-02-22 MED ORDER — BACLOFEN 10 MG PO TABS: 5.0000 mg | ORAL_TABLET | Freq: Every day | ORAL | 3 refills | Status: DC

## 2019-02-22 NOTE — Progress Notes (Signed)
Reason for visit: Myasthenia gravis, peripheral neuropathy, nocturnal leg cramps, history of seizures  Kyle [name]. is an 84 y.o. male  History of present illness:  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  He is doing quite well with this since the recent increase in dose of the Mestinon.  He has not had any seizures in 17 years, he currently is on Keppra and tolerates the medication well.  He does have some numbness in the feet, he was diagnosed with a peripheral neuropathy recently, he takes gabapentin only in the evenings which has helped his ability to rest and sleep.  The patient continues to have nocturnal leg cramps, he is not sure that the increase in the Mestinon worsened this.  He will have cramps in the feet or in the calf muscles of either leg about 3 or 4 nights out of the week.  He has to get up and stretch, he sometimes will take mustard for this.  The patient denies any significant balance issues, he has not had any falls.  He returns to the office today for an evaluation.   Past Medical History:  Diagnosis Date  . Arthritis   . Auto immune neutropenia (HCC)   . Bullous pemphigoid   . Cancer [facility])    Prostate  . Cervical spondylosis without myelopathy 01/22/2016  . DVT (deep venous thrombosis) ([place])    a. Has had a previous right lower extremity DVT in the setting of hospitalization and surgery (after his brain tumor was resected in 1993) but is no longer on Coumadin.  Marland Kitchen Dyslipidemia   . History of prostate cancer   . Hypertension   . Meningioma ([place])    a. s/p resection 1990s.  . Myasthenia ([place]) 02/14/2013  . Nocturnal leg cramps   . Ocular myasthenia gravis ([place])   . Peripheral neuropathy 11/23/2018  . Seizures ([place])   . Sinus bradycardia   . Sleep paralysis   . Thyroid nodule, cold     Past Surgical History:  Procedure Laterality Date  . BRAIN SURGERY    .  CHOLECYSTECTOMY    . COLONOSCOPY N/A 07/25/2013   Procedure: COLONOSCOPY;  Surgeon: Jamesetta So, MD;  [facility];  Service: Gastroenterology;  Laterality: N/A;  . COLONOSCOPY N/A 11/10/2016   Procedure: COLONOSCOPY;  Surgeon: Aviva Signs, MD;  Location[facility];  Service: Gastroenterology;  Laterality: N/A;  . IR CATHETER TUBE CHANGE  01/02/2017  . IR RADIOLOGIST EVAL & MGMT  01/06/2017  . IR RADIOLOGIST EVAL & MGMT  01/20/2017  . IR RADIOLOGIST EVAL & MGMT  02/03/2017  . IR RADIOLOGIST EVAL & MGMT  02/17/2017  . IR SINUS/FIST TUBE CHK-NON GI  03/03/2017  . RADIOACTIVE SEED IMPLANT      Family History  Problem Relation Age of Onset  . Heart failure Mother   . Diabetes Mother   . Diabetes Sister   . Dementia Sister   . Lung cancer Son   . Heart failure Daughter   . Colon cancer Neg Hx     Social history:  reports that he has never smoked. He has never used smokeless tobacco. He reports that he does not drink alcohol or use drugs.    Allergies  Allergen Reactions  . Iodinated Diagnostic Agents Hives     pt was premedicated/hives reaction occurred 24 hours post injection, Onset Date: [id number]   .  Sulfa Antibiotics Swelling and Rash  . Bactericin [Bacitracin] Other (See Comments)    Unknown reaction     Medications:  Prior to Admission medications   Medication Sig Start Date End Date Taking? Authorizing Provider  clobetasol (TEMOVATE) 0.05 % external solution Apply 1 application topically daily as needed (rash).  11/17/12   [provider]  diclofenac (VOLTAREN) 75 MG EC tablet TAKE 1 TABLET BY MOUTH  TWICE DAILY 02/12/19   Suzzanne Cloud, NP  fish oil-omega-3 fatty acids 1000 MG capsule Take 1 g by mouth 3 (three) times daily.    [provider]  gabapentin (NEURONTIN) 600 MG tablet Take 1 tablet (600 mg total) by mouth daily. 08/23/18   Suzzanne Cloud, NP  Garlic (GARLIQUE PO) Take 1 tablet by mouth daily.    [provider]   levETIRAcetam (KEPPRA) 500 MG tablet TAKE 1/2 TABLET BY MOUTH IN THE MORNING AND 1 TABLET IN THE EVENING 10/19/18   Suzzanne Cloud, NP  losartan (COZAAR) 25 MG tablet Take 25 mg by mouth at bedtime.  06/02/16   [provider]  niacinamide 500 MG tablet Take 500 mg by mouth 3 (three) times daily.    [provider]  Polyethyl Glycol-Propyl Glycol (SYSTANE) 0.4-0.3 % SOLN Place 1 drop into both eyes 3 (three) times daily as needed (for dry eyes).    [provider]  predniSONE (DELTASONE) 2.5 MG tablet Take 1 tablet (2.5 mg total) by mouth daily with breakfast. 10/19/18   Suzzanne Cloud, NP  pyridostigmine (MESTINON) 60 MG tablet Take 1.5 tablets (90 mg total) by mouth 4 (four) times daily. 08/23/18   Suzzanne Cloud, NP  simvastatin (ZOCOR) 20 MG tablet Take 20 mg by mouth at bedtime.  06/02/16   [provider]    ROS:  Out of a complete 14 system review of symptoms, the patient complains only of the following symptoms, and all other reviewed systems are negative.  Leg cramps Numbness  Blood pressure 133/73, pulse (!) 53, temperature 97.6 F (36.4 C), temperature source Temporal, weight 169 lb 5 oz (76.8 kg).  Physical Exam  General: The patient is alert and cooperative at the time of the examination.  Skin: No significant peripheral edema is noted.   Neurologic Exam  Mental status: The patient is alert and oriented x 3 at the time of the examination. The patient has apparent normal recent and remote memory, with an apparently normal attention span and concentration ability.   Cranial nerves: Facial symmetry is present. Speech is normal, no aphasia or dysarthria is noted. Extraocular movements are full. Visual fields are full.  Motor: The patient has good strength in all 4 extremities.  Sensory examination: Soft touch sensation is symmetric on the face, arms, and legs.  Coordination: The patient has good finger-nose-finger and heel-to-shin  bilaterally.  Gait and station: The patient has a normal gait. Tandem gait is slightly unsteady. Romberg is negative. No drift is seen.  Reflexes: Deep tendon reflexes are symmetric.   Assessment/Plan:  1.  Peripheral neuropathy  2.  Ocular myasthenia gravis  3.  History of seizures  4.  Nocturnal leg cramps  I discussed the possibility of using baclofen at night for the nocturnal leg cramps, I will call in a small prescription for this.  The patient is doing well with his seizures and ocular myasthenia gravis, we will continue the Keppra and the Mestinon.  The patient will continue on gabapentin for the peripheral neuropathy.  He will follow-up here in 9 months.  He appears to be neurologically stable.   Jill Alexanders MD 02/22/2019 8:35 AM  [facility] [address] [place], [place] [address]  Phone [phone] Fax [phone]

## 2019-03-13 DIAGNOSIS — M1991 Primary osteoarthritis, unspecified site: Secondary | ICD-10-CM | POA: Diagnosis not present

## 2019-03-13 DIAGNOSIS — N183 Chronic kidney disease, stage 3 unspecified: Secondary | ICD-10-CM | POA: Diagnosis not present

## 2019-03-13 DIAGNOSIS — I129 Hypertensive chronic kidney disease with stage 1 through stage 4 chronic kidney disease, or unspecified chronic kidney disease: Secondary | ICD-10-CM | POA: Diagnosis not present

## 2019-04-01 ENCOUNTER — Other Ambulatory Visit: Payer: Self-pay | Admitting: Neurology

## 2019-04-10 DIAGNOSIS — R3915 Urgency of urination: Secondary | ICD-10-CM | POA: Diagnosis not present

## 2019-04-13 DIAGNOSIS — I129 Hypertensive chronic kidney disease with stage 1 through stage 4 chronic kidney disease, or unspecified chronic kidney disease: Secondary | ICD-10-CM | POA: Diagnosis not present

## 2019-04-13 DIAGNOSIS — G7000 Myasthenia gravis without (acute) exacerbation: Secondary | ICD-10-CM | POA: Diagnosis not present

## 2019-04-13 DIAGNOSIS — M1991 Primary osteoarthritis, unspecified site: Secondary | ICD-10-CM | POA: Diagnosis not present

## 2019-04-13 DIAGNOSIS — M5030 Other cervical disc degeneration, unspecified cervical region: Secondary | ICD-10-CM | POA: Diagnosis not present

## 2019-04-27 DIAGNOSIS — M545 Low back pain: Secondary | ICD-10-CM | POA: Diagnosis not present

## 2019-04-27 DIAGNOSIS — M25552 Pain in left hip: Secondary | ICD-10-CM | POA: Diagnosis not present

## 2019-05-02 ENCOUNTER — Other Ambulatory Visit: Payer: Self-pay | Admitting: Surgical Oncology

## 2019-05-02 ENCOUNTER — Telehealth: Payer: Self-pay

## 2019-05-02 ENCOUNTER — Other Ambulatory Visit: Payer: Self-pay

## 2019-05-02 DIAGNOSIS — R195 Other fecal abnormalities: Secondary | ICD-10-CM | POA: Diagnosis not present

## 2019-05-02 DIAGNOSIS — K30 Functional dyspepsia: Secondary | ICD-10-CM | POA: Diagnosis not present

## 2019-05-02 DIAGNOSIS — E039 Hypothyroidism, unspecified: Secondary | ICD-10-CM

## 2019-05-02 NOTE — Telephone Encounter (Signed)
Please reorder labs to [place]

## 2019-05-03 ENCOUNTER — Other Ambulatory Visit: Payer: Self-pay | Admitting: "Endocrinology

## 2019-05-03 DIAGNOSIS — E039 Hypothyroidism, unspecified: Secondary | ICD-10-CM | POA: Diagnosis not present

## 2019-05-04 LAB — T4, FREE: Free T4: 1.3 ng/dL (ref 0.8–1.8)

## 2019-05-04 LAB — TSH: TSH: 1.65 mIU/L (ref 0.40–4.50)

## 2019-05-05 ENCOUNTER — Other Ambulatory Visit (HOSPITAL_COMMUNITY): Payer: Self-pay | Admitting: Family

## 2019-05-07 DIAGNOSIS — R198 Other specified symptoms and signs involving the digestive system and abdomen: Secondary | ICD-10-CM | POA: Insufficient documentation

## 2019-05-09 ENCOUNTER — Ambulatory Visit (HOSPITAL_COMMUNITY): Payer: Medicare Other

## 2019-05-09 ENCOUNTER — Ambulatory Visit (HOSPITAL_COMMUNITY)
Admission: RE | Admit: 2019-05-09 | Discharge: 2019-05-09 | Disposition: A | Discharge status: Home / Self Care | Payer: Medicare Other | Source: Ambulatory Visit | Attending: Surgical Oncology | Admitting: Surgical Oncology

## 2019-05-09 ENCOUNTER — Other Ambulatory Visit: Payer: Self-pay

## 2019-05-09 DIAGNOSIS — K5730 Diverticulosis of large intestine without perforation or abscess without bleeding: Secondary | ICD-10-CM | POA: Diagnosis not present

## 2019-05-09 DIAGNOSIS — R195 Other fecal abnormalities: Secondary | ICD-10-CM | POA: Diagnosis not present

## 2019-05-14 DIAGNOSIS — M1991 Primary osteoarthritis, unspecified site: Secondary | ICD-10-CM | POA: Diagnosis not present

## 2019-05-14 DIAGNOSIS — N183 Chronic kidney disease, stage 3 unspecified: Secondary | ICD-10-CM | POA: Diagnosis not present

## 2019-05-14 DIAGNOSIS — I129 Hypertensive chronic kidney disease with stage 1 through stage 4 chronic kidney disease, or unspecified chronic kidney disease: Secondary | ICD-10-CM | POA: Diagnosis not present

## 2019-05-15 ENCOUNTER — Ambulatory Visit (INDEPENDENT_AMBULATORY_CARE_PROVIDER_SITE_OTHER): Payer: Medicare Other | Admitting: "Endocrinology

## 2019-05-15 ENCOUNTER — Ambulatory Visit: Payer: Medicare Other | Admitting: "Endocrinology

## 2019-05-15 ENCOUNTER — Encounter: Payer: Self-pay | Admitting: "Endocrinology

## 2019-05-15 DIAGNOSIS — E042 Nontoxic multinodular goiter: Secondary | ICD-10-CM | POA: Diagnosis not present
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
## 2019-05-16 ENCOUNTER — Telehealth: Payer: Self-pay | Admitting: Neurology

## 2019-05-16 NOTE — Telephone Encounter (Signed)
I called the patient.  The patient is to get the vaccination as soon as he can get one scheduled.

## 2019-05-16 NOTE — Telephone Encounter (Signed)
Patient is calling in wanting to know if it is ok to take COVID Vaccine

## 2019-05-19 DIAGNOSIS — R197 Diarrhea, unspecified: Secondary | ICD-10-CM | POA: Diagnosis not present

## 2019-05-19 DIAGNOSIS — D126 Benign neoplasm of colon, unspecified: Secondary | ICD-10-CM | POA: Diagnosis not present

## 2019-05-22 DIAGNOSIS — R195 Other fecal abnormalities: Secondary | ICD-10-CM | POA: Diagnosis not present

## 2019-06-11 DIAGNOSIS — M1991 Primary osteoarthritis, unspecified site: Secondary | ICD-10-CM | POA: Diagnosis not present

## 2019-06-11 DIAGNOSIS — N183 Chronic kidney disease, stage 3 unspecified: Secondary | ICD-10-CM | POA: Diagnosis not present

## 2019-06-11 DIAGNOSIS — I129 Hypertensive chronic kidney disease with stage 1 through stage 4 chronic kidney disease, or unspecified chronic kidney disease: Secondary | ICD-10-CM | POA: Diagnosis not present

## 2019-06-16 DIAGNOSIS — R933 Abnormal findings on diagnostic imaging of other parts of digestive tract: Secondary | ICD-10-CM | POA: Diagnosis not present

## 2019-06-16 DIAGNOSIS — R197 Diarrhea, unspecified: Secondary | ICD-10-CM | POA: Diagnosis not present

## 2019-06-21 DIAGNOSIS — Z0001 Encounter for general adult medical examination with abnormal findings: Secondary | ICD-10-CM | POA: Diagnosis not present

## 2019-06-21 DIAGNOSIS — M1991 Primary osteoarthritis, unspecified site: Secondary | ICD-10-CM | POA: Diagnosis not present

## 2019-06-21 DIAGNOSIS — E559 Vitamin D deficiency, unspecified: Secondary | ICD-10-CM | POA: Diagnosis not present

## 2019-06-21 DIAGNOSIS — Z1389 Encounter for screening for other disorder: Secondary | ICD-10-CM | POA: Diagnosis not present

## 2019-06-21 DIAGNOSIS — E538 Deficiency of other specified B group vitamins: Secondary | ICD-10-CM | POA: Diagnosis not present

## 2019-06-21 DIAGNOSIS — N183 Chronic kidney disease, stage 3 unspecified: Secondary | ICD-10-CM | POA: Diagnosis not present

## 2019-06-21 DIAGNOSIS — E782 Mixed hyperlipidemia: Secondary | ICD-10-CM | POA: Diagnosis not present

## 2019-06-21 DIAGNOSIS — I700 Atherosclerosis of aorta: Secondary | ICD-10-CM | POA: Diagnosis not present

## 2019-06-21 DIAGNOSIS — E039 Hypothyroidism, unspecified: Secondary | ICD-10-CM | POA: Diagnosis not present

## 2019-06-21 DIAGNOSIS — M5410 Radiculopathy, site unspecified: Secondary | ICD-10-CM | POA: Diagnosis not present

## 2019-06-21 DIAGNOSIS — I10 Essential (primary) hypertension: Secondary | ICD-10-CM | POA: Diagnosis not present

## 2019-06-21 DIAGNOSIS — M5417 Radiculopathy, lumbosacral region: Secondary | ICD-10-CM | POA: Diagnosis not present

## 2019-06-21 DIAGNOSIS — I129 Hypertensive chronic kidney disease with stage 1 through stage 4 chronic kidney disease, or unspecified chronic kidney disease: Secondary | ICD-10-CM | POA: Diagnosis not present

## 2019-07-06 DIAGNOSIS — M5136 Other intervertebral disc degeneration, lumbar region: Secondary | ICD-10-CM | POA: Diagnosis not present

## 2019-07-12 DIAGNOSIS — M1991 Primary osteoarthritis, unspecified site: Secondary | ICD-10-CM | POA: Diagnosis not present

## 2019-07-12 DIAGNOSIS — I129 Hypertensive chronic kidney disease with stage 1 through stage 4 chronic kidney disease, or unspecified chronic kidney disease: Secondary | ICD-10-CM | POA: Diagnosis not present

## 2019-07-12 DIAGNOSIS — N183 Chronic kidney disease, stage 3 unspecified: Secondary | ICD-10-CM | POA: Diagnosis not present

## 2019-07-31 DIAGNOSIS — K120 Recurrent oral aphthae: Secondary | ICD-10-CM | POA: Diagnosis not present

## 2019-08-11 DIAGNOSIS — I129 Hypertensive chronic kidney disease with stage 1 through stage 4 chronic kidney disease, or unspecified chronic kidney disease: Secondary | ICD-10-CM | POA: Diagnosis not present

## 2019-08-11 DIAGNOSIS — N183 Chronic kidney disease, stage 3 unspecified: Secondary | ICD-10-CM | POA: Diagnosis not present

## 2019-08-11 DIAGNOSIS — M1991 Primary osteoarthritis, unspecified site: Secondary | ICD-10-CM | POA: Diagnosis not present

## 2019-08-15 DIAGNOSIS — M47816 Spondylosis without myelopathy or radiculopathy, lumbar region: Secondary | ICD-10-CM | POA: Diagnosis not present

## 2019-08-25 DIAGNOSIS — M47816 Spondylosis without myelopathy or radiculopathy, lumbar region: Secondary | ICD-10-CM | POA: Insufficient documentation

## 2019-09-02 ENCOUNTER — Other Ambulatory Visit: Payer: Self-pay | Admitting: Neurology

## 2019-09-05 DIAGNOSIS — M47896 Other spondylosis, lumbar region: Secondary | ICD-10-CM | POA: Diagnosis not present

## 2019-09-18 ENCOUNTER — Telehealth: Payer: Self-pay | Admitting: Neurology

## 2019-09-18 ENCOUNTER — Other Ambulatory Visit: Payer: Self-pay

## 2019-09-18 MED ORDER — BACLOFEN 10 MG PO TABS: 5.0000 mg | ORAL_TABLET | Freq: Every day | ORAL | 6 refills | Status: DC

## 2019-09-18 NOTE — Telephone Encounter (Signed)
Pt has called for a refill on his baclofen (LIORESAL) 10 MG tablet to [facility]

## 2019-09-18 NOTE — Telephone Encounter (Signed)
Refill of baclofen sent to [phone] per pt request electronically.

## 2019-09-26 DIAGNOSIS — M545 Low back pain: Secondary | ICD-10-CM | POA: Diagnosis not present

## 2019-09-26 DIAGNOSIS — M5136 Other intervertebral disc degeneration, lumbar region: Secondary | ICD-10-CM | POA: Diagnosis not present

## 2019-10-11 DIAGNOSIS — M1991 Primary osteoarthritis, unspecified site: Secondary | ICD-10-CM | POA: Diagnosis not present

## 2019-10-11 DIAGNOSIS — I129 Hypertensive chronic kidney disease with stage 1 through stage 4 chronic kidney disease, or unspecified chronic kidney disease: Secondary | ICD-10-CM | POA: Diagnosis not present

## 2019-10-11 DIAGNOSIS — N183 Chronic kidney disease, stage 3 unspecified: Secondary | ICD-10-CM | POA: Diagnosis not present

## 2019-10-23 DIAGNOSIS — M5136 Other intervertebral disc degeneration, lumbar region: Secondary | ICD-10-CM | POA: Diagnosis not present

## 2019-10-30 DIAGNOSIS — M5136 Other intervertebral disc degeneration, lumbar region: Secondary | ICD-10-CM | POA: Diagnosis not present

## 2019-10-30 DIAGNOSIS — M48062 Spinal stenosis, lumbar region with neurogenic claudication: Secondary | ICD-10-CM | POA: Diagnosis not present

## 2019-11-06 DIAGNOSIS — K121 Other forms of stomatitis: Secondary | ICD-10-CM | POA: Diagnosis not present

## 2019-11-10 DIAGNOSIS — I129 Hypertensive chronic kidney disease with stage 1 through stage 4 chronic kidney disease, or unspecified chronic kidney disease: Secondary | ICD-10-CM | POA: Diagnosis not present

## 2019-11-10 DIAGNOSIS — M1991 Primary osteoarthritis, unspecified site: Secondary | ICD-10-CM | POA: Diagnosis not present

## 2019-11-10 DIAGNOSIS — N183 Chronic kidney disease, stage 3 unspecified: Secondary | ICD-10-CM | POA: Diagnosis not present

## 2019-11-15 ENCOUNTER — Other Ambulatory Visit: Payer: Self-pay | Admitting: Neurology

## 2019-11-15 DIAGNOSIS — K121 Other forms of stomatitis: Secondary | ICD-10-CM | POA: Diagnosis not present

## 2019-11-22 ENCOUNTER — Encounter: Payer: Self-pay | Admitting: Neurology

## 2019-11-22 ENCOUNTER — Ambulatory Visit (INDEPENDENT_AMBULATORY_CARE_PROVIDER_SITE_OTHER): Payer: Medicare Other | Admitting: Neurology

## 2019-11-22 VITALS — BP 133/61 | HR 66 | Ht 68.0 in | Wt 162.1 lb

## 2019-11-22 DIAGNOSIS — G7000 Myasthenia gravis without (acute) exacerbation: Secondary | ICD-10-CM

## 2019-11-22 DIAGNOSIS — R569 Unspecified convulsions: Secondary | ICD-10-CM | POA: Diagnosis not present

## 2019-11-22 DIAGNOSIS — L129 Pemphigoid, unspecified: Secondary | ICD-10-CM | POA: Diagnosis not present

## 2019-11-22 DIAGNOSIS — G4762 Sleep related leg cramps: Secondary | ICD-10-CM | POA: Diagnosis not present

## 2019-11-22 DIAGNOSIS — G609 Hereditary and idiopathic neuropathy, unspecified: Secondary | ICD-10-CM | POA: Diagnosis not present

## 2019-11-22 DIAGNOSIS — Z0000 Encounter for general adult medical examination without abnormal findings: Secondary | ICD-10-CM | POA: Diagnosis not present

## 2019-11-22 MED ORDER — PYRIDOSTIGMINE BROMIDE 60 MG PO TABS: ORAL_TABLET | ORAL | 3 refills | Status: DC

## 2019-11-22 MED ORDER — BACLOFEN 10 MG PO TABS: 10.0000 mg | ORAL_TABLET | Freq: Every day | ORAL | 11 refills | Status: DC

## 2019-11-22 NOTE — Patient Instructions (Signed)
Increase Baclofen to 10 mg at bedtime  You can continue trying the tsp of mustard at bedtime  Continue other medications Follow-up with orthopedics  See you back in 9 months or sooner if needed

## 2019-11-22 NOTE — Progress Notes (Signed)
PATIENT: Kyle [name]. DOB: March 31, 1935  REASON FOR VISIT: follow up HISTORY FROM: patient  HISTORY OF PRESENT ILLNESS: Today 11/22/19 Kyle [name] is an 85 year old male with history of ocular myasthenia gravis, only taking Mestinon.  History of seizures, on Keppra, no seizures in 18 years.  He has peripheral neuropathy, takes gabapentin.  He has nocturnal leg cramps, on baclofen.  A few months ago, developed left hip pain and back pain, went to [facility], had a series of injections without benefit, reports MRI of the lumbar spine, showed spinal stenosis.  A referral to neurosurgery has been initiated, pending appointment. He thinks he will decide to do nothing for now.  Over time, has noted weakness in both legs.  He remains quite active, working in the yard.  No falls. Still deals with nocturnal leg cramps, will wake during the night with painful cramps, will get up and walk around, take a spoon full of mustard or vinegar with good benefit.  Denies blurred vision or double vision. Presents today for evaluation unaccompanied.   HISTORY 02/22/2019 Dr. Jannifer Franklin: Kyle [name] is a an 85 year old right-handed white male with a history of ocular myasthenia gravis treated only with Mestinon.  He is doing quite well with this since the recent increase in dose of the Mestinon.  He has not had any seizures in 17 years, he currently is on Keppra and tolerates the medication well.  He does have some numbness in the feet, he was diagnosed with a peripheral neuropathy recently, he takes gabapentin only in the evenings which has helped his ability to rest and sleep.  The patient continues to have nocturnal leg cramps, he is not sure that the increase in the Mestinon worsened this.  He will have cramps in the feet or in the calf muscles of either leg about 3 or 4 nights out of the week.  He has to get up and stretch, he sometimes will take mustard for this.  The patient denies any significant balance issues,  he has not had any falls.  He returns to the office today for an evaluation.   REVIEW OF SYSTEMS: Out of a complete 14 system review of symptoms, the patient complains only of the following symptoms, and all other reviewed systems are negative.  Weakness  ALLERGIES: Allergies  Allergen Reactions  . Iodinated Diagnostic Agents Hives     pt was premedicated/hives reaction occurred 24 hours post injection, Onset Date: [id number]   . Sulfa Antibiotics Swelling and Rash  . Bactericin [Bacitracin] Other (See Comments)    Unknown reaction     HOME MEDICATIONS: Outpatient Medications Prior to Visit  Medication Sig Dispense Refill  . clobetasol (TEMOVATE) 0.05 % external solution Apply 1 application topically daily as needed (rash).     . diclofenac (VOLTAREN) 75 MG EC tablet TAKE 1 TABLET BY MOUTH  TWICE DAILY 180 tablet 3  . fish oil-omega-3 fatty acids 1000 MG capsule Take 1 g by mouth 3 (three) times daily.    Marland Kitchen gabapentin (NEURONTIN) 600 MG tablet TAKE 1 TABLET BY MOUTH  DAILY 90 tablet 3  . Garlic (GARLIQUE PO) Take 1 tablet by mouth daily.    Marland Kitchen levETIRAcetam (KEPPRA) 500 MG tablet TAKE 1/2 TABLET BY MOUTH IN THE MORNING AND 1 TABLET IN THE EVENING 135 tablet 3  . losartan (COZAAR) 25 MG tablet Take 50 mg by mouth at bedtime.     . niacinamide 500 MG tablet Take 500 mg by mouth 3 (  three) times daily.    Vladimir Faster Glycol-Propyl Glycol (SYSTANE) 0.4-0.3 % SOLN Place 1 drop into both eyes 3 (three) times daily as needed (for dry eyes).    . simvastatin (ZOCOR) 20 MG tablet Take 20 mg by mouth at bedtime.     . baclofen (LIORESAL) 10 MG tablet Take 0.5 tablets (5 mg total) by mouth at bedtime. 30 each 6  . pyridostigmine (MESTINON) 60 MG tablet TAKE 1 AND 1/2 TABLETS BY  MOUTH 4 TIMES DAILY 540 tablet 3   No facility-administered medications prior to visit.    PAST MEDICAL HISTORY: Past Medical History:  Diagnosis Date  . Arthritis   . Auto immune neutropenia (HCC)   . Bullous  pemphigoid   . Cancer [facility])    Prostate  . Cervical spondylosis without myelopathy 01/22/2016  . DVT (deep venous thrombosis) ([place])    a. Has had a previous right lower extremity DVT in the setting of hospitalization and surgery (after his brain tumor was resected in 1993) but is no longer on Coumadin.  Marland Kitchen Dyslipidemia   . History of prostate cancer   . Hypertension   . Meningioma ([place])    a. s/p resection 1990s.  . Myasthenia ([place]) 02/14/2013  . Nocturnal leg cramps   . Ocular myasthenia gravis ([place])   . Peripheral neuropathy 11/23/2018  . Seizures ([place])   . Sinus bradycardia   . Sleep paralysis   . Thyroid nodule, cold     PAST SURGICAL HISTORY: Past Surgical History:  Procedure Laterality Date  . BRAIN SURGERY    . CHOLECYSTECTOMY    . COLONOSCOPY N/A 07/25/2013   Procedure: COLONOSCOPY;  Surgeon: Jamesetta So, MD;  [facility];  Service: Gastroenterology;  Laterality: N/A;  . COLONOSCOPY N/A 11/10/2016   Procedure: COLONOSCOPY;  Surgeon: Aviva Signs, MD;  [facility];  Service: Gastroenterology;  Laterality: N/A;  . IR CATHETER TUBE CHANGE  01/02/2017  . IR RADIOLOGIST EVAL & MGMT  01/06/2017  . IR RADIOLOGIST EVAL & MGMT  01/20/2017  . IR RADIOLOGIST EVAL & MGMT  02/03/2017  . IR RADIOLOGIST EVAL & MGMT  02/17/2017  . IR SINUS/FIST TUBE CHK-NON GI  03/03/2017  . RADIOACTIVE SEED IMPLANT      FAMILY HISTORY: Family History  Problem Relation Age of Onset  . Heart failure Mother   . Diabetes Mother   . Diabetes Sister   . Dementia Sister   . Lung cancer Son   . Heart failure Daughter   . Colon cancer Neg Hx     SOCIAL HISTORY: Social History   Socioeconomic History  . Marital status: Married    Spouse name: Not on file  . Number of children: 1  . Years of education: 2 TH  . Highest education level: Not on file  Occupational History  . Occupation: retired  Tobacco Use  . Smoking status: Never Smoker  . Smokeless tobacco: Never Used   Substance and Sexual Activity  . Alcohol use: No  . Drug use: No  . Sexual activity: Not on file  Other Topics Concern  . Not on file  Social History Narrative   Patient is right handed.   Patient drinks 1 cup of coffee daily.   Social Determinants of Health   Financial Resource Strain:   . Difficulty of Paying Living Expenses:   Food Insecurity:   . Worried About Charity fundraiser in the Last Year:   . [employer]  in the Last Year:   Transportation Needs:   . Film/video editor (Medical):   Marland Kitchen Lack of Transportation (Non-Medical):   Physical Activity:   . Days of Exercise per Week:   . Minutes of Exercise per Session:   Stress:   . Feeling of Stress :   Social Connections:   . Frequency of Communication with Friends and Family:   . Frequency of Social Gatherings with Friends and Family:   . Attends Religious Services:   . Active Member of Clubs or Organizations:   . Attends Archivist Meetings:   Marland Kitchen Marital Status:   Intimate Partner Violence:   . Fear of Current or Ex-Partner:   . Emotionally Abused:   Marland Kitchen Physically Abused:   . Sexually Abused:    PHYSICAL EXAM  Vitals:   11/22/19 0718  BP: 133/61  Pulse: 66  Weight: 162 lb 1.6 oz (73.5 kg)  Height: 5\' 8"  (1.727 m)   Body mass index is 24.65 kg/m.  Generalized: Well developed, in no acute distress  Neurological examination  Mentation: Alert oriented to time, place, history taking. Follows all commands speech and language fluent Cranial nerve II-XII: Pupils were equal round reactive to light. Extraocular movements were full, visual field were full on confrontational test. Facial sensation and strength were normal. Head turning and shoulder shrug  were normal and symmetric. With superior gaze for 1 minute, no subjective diplopia or ptosis was noted. Motor: Good strength in all extremities, 4/5 with left hip flexion Sensory: Sensory testing is intact to soft touch on all 4 extremities. No  evidence of extinction is noted.  Coordination: Cerebellar testing reveals good finger-nose-finger and heel-to-shin bilaterally.  Gait and station: Gait is normal, slightly wide-based, cautious, but steady Reflexes: Deep tendon reflexes are symmetric   DIAGNOSTIC DATA (LABS, IMAGING, TESTING) - I reviewed patient records, labs, notes, testing and imaging myself where available.  Lab Results  Component Value Date   WBC 9.2 03/03/2017   HGB 13.6 03/03/2017   HCT 40.4 03/03/2017   MCV 93.1 03/03/2017   PLT 192 03/03/2017      Component Value Date/Time   NA 141 03/03/2017 1024   K 3.9 03/03/2017 1024   CL 106 03/03/2017 1024   CO2 27 03/03/2017 1024   GLUCOSE 89 03/03/2017 1024   BUN 20 03/03/2017 1024   CREATININE 0.68 03/03/2017 1024   CALCIUM 9.6 03/03/2017 1024   PROT 6.9 11/23/2018 1518   ALBUMIN 4.1 03/03/2017 1024   AST 22 03/03/2017 1024   ALT 19 03/03/2017 1024   ALKPHOS 59 03/03/2017 1024   BILITOT 0.9 03/03/2017 1024   GFRNONAA >60 03/03/2017 1024   GFRAA >60 03/03/2017 1024   Lab Results  Component Value Date   CHOL 182 10/26/2014   HDL 67 10/26/2014   LDLCALC 89 10/26/2014   TRIG 130 10/26/2014   CHOLHDL 2.7 10/26/2014   Lab Results  Component Value Date   HGBA1C 5.9 (H) 10/25/2014   Lab Results  Component Value Date   VITAMINB12 292 11/23/2018   Lab Results  Component Value Date   TSH 1.65 05/03/2019   ASSESSMENT AND PLAN 85 y.o. year old male  has a past medical history of Arthritis, Auto immune neutropenia ([place]), Bullous pemphigoid, Cancer ([place]), Cervical spondylosis without myelopathy (01/22/2016), DVT (deep venous thrombosis) ([place]), Dyslipidemia, History of prostate cancer, Hypertension, Meningioma ([place]), Myasthenia ([place]) (02/14/2013), Nocturnal leg cramps, Ocular myasthenia gravis ([place]), Peripheral neuropathy (11/23/2018), Seizures ([place]), Sinus bradycardia, Sleep paralysis, and  Thyroid nodule, cold. here with:  1. Peripheral neuropathy -Continue  gabapentin 600 mg at bedtime  2. Ocular myasthenia gravis -Overall stable -Continue Mestinon 60 mg, 1.5 tablets 4 times daily  3. History of seizures -No recurrent seizure in several years -Continue Keppra 500 mg, 1/2 tablet in the morning, 1 tablet in the evening  4. Nocturnal leg cramps -Increase 10 mg baclofen at bedtime -May try tsp of mustard before bed to help   5. Reported lumbar stenosis -MRI is not available to me, done at [facility] per patient -A neurosurgical opinion has been initiated -May be contributing to his report of leg weakness -NCS/EMG in August 2020, showed a primarily motor neuropathy no evidence of overlying lumbosacral radiculopathy  Follow-up in our office in 9 months or sooner if needed.  I spent 30 minutes of face-to-face and non-face-to-face time with patient.  This included previsit chart review, lab review, study review, order entry, electronic health record documentation, patient education.  Butler Denmark, AGNP-C, DNP 11/22/2019, 8:43 AM [facility] [address] [place],  [address] [phone]

## 2019-11-22 NOTE — Progress Notes (Signed)
I have read the note, and I agree with the clinical assessment and plan.  Duard Spiewak K Britlyn Martine   

## 2019-12-07 DIAGNOSIS — M4316 Spondylolisthesis, lumbar region: Secondary | ICD-10-CM | POA: Diagnosis not present

## 2019-12-07 DIAGNOSIS — M48062 Spinal stenosis, lumbar region with neurogenic claudication: Secondary | ICD-10-CM | POA: Diagnosis not present

## 2019-12-12 DIAGNOSIS — I129 Hypertensive chronic kidney disease with stage 1 through stage 4 chronic kidney disease, or unspecified chronic kidney disease: Secondary | ICD-10-CM | POA: Diagnosis not present

## 2019-12-12 DIAGNOSIS — N182 Chronic kidney disease, stage 2 (mild): Secondary | ICD-10-CM | POA: Diagnosis not present

## 2019-12-12 DIAGNOSIS — M1991 Primary osteoarthritis, unspecified site: Secondary | ICD-10-CM | POA: Diagnosis not present

## 2019-12-14 DIAGNOSIS — D225 Melanocytic nevi of trunk: Secondary | ICD-10-CM | POA: Diagnosis not present

## 2019-12-14 DIAGNOSIS — L120 Bullous pemphigoid: Secondary | ICD-10-CM | POA: Diagnosis not present

## 2019-12-14 DIAGNOSIS — X32XXXD Exposure to sunlight, subsequent encounter: Secondary | ICD-10-CM | POA: Diagnosis not present

## 2019-12-14 DIAGNOSIS — L570 Actinic keratosis: Secondary | ICD-10-CM | POA: Diagnosis not present

## 2020-01-11 DIAGNOSIS — N183 Chronic kidney disease, stage 3 unspecified: Secondary | ICD-10-CM | POA: Diagnosis not present

## 2020-01-11 DIAGNOSIS — I129 Hypertensive chronic kidney disease with stage 1 through stage 4 chronic kidney disease, or unspecified chronic kidney disease: Secondary | ICD-10-CM | POA: Diagnosis not present

## 2020-01-11 DIAGNOSIS — M1991 Primary osteoarthritis, unspecified site: Secondary | ICD-10-CM | POA: Diagnosis not present

## 2020-01-16 DIAGNOSIS — M48062 Spinal stenosis, lumbar region with neurogenic claudication: Secondary | ICD-10-CM | POA: Diagnosis not present

## 2020-02-08 DIAGNOSIS — H16223 Keratoconjunctivitis sicca, not specified as Sjogren's, bilateral: Secondary | ICD-10-CM | POA: Diagnosis not present

## 2020-02-10 DIAGNOSIS — I129 Hypertensive chronic kidney disease with stage 1 through stage 4 chronic kidney disease, or unspecified chronic kidney disease: Secondary | ICD-10-CM | POA: Diagnosis not present

## 2020-02-10 DIAGNOSIS — N183 Chronic kidney disease, stage 3 unspecified: Secondary | ICD-10-CM | POA: Diagnosis not present

## 2020-02-10 DIAGNOSIS — M1991 Primary osteoarthritis, unspecified site: Secondary | ICD-10-CM | POA: Diagnosis not present

## 2020-02-22 ENCOUNTER — Other Ambulatory Visit: Payer: Self-pay | Admitting: *Deleted

## 2020-02-22 MED ORDER — DICLOFENAC SODIUM 75 MG PO TBEC: 75.0000 mg | DELAYED_RELEASE_TABLET | Freq: Two times a day (BID) | ORAL | 3 refills | Status: DC

## 2020-04-12 DIAGNOSIS — N183 Chronic kidney disease, stage 3 unspecified: Secondary | ICD-10-CM | POA: Diagnosis not present

## 2020-04-12 DIAGNOSIS — M1991 Primary osteoarthritis, unspecified site: Secondary | ICD-10-CM | POA: Diagnosis not present

## 2020-04-12 DIAGNOSIS — I129 Hypertensive chronic kidney disease with stage 1 through stage 4 chronic kidney disease, or unspecified chronic kidney disease: Secondary | ICD-10-CM | POA: Diagnosis not present

## 2020-04-29 ENCOUNTER — Ambulatory Visit (HOSPITAL_COMMUNITY): Payer: Medicare Other

## 2020-05-06 ENCOUNTER — Other Ambulatory Visit: Payer: Self-pay

## 2020-05-06 DIAGNOSIS — E039 Hypothyroidism, unspecified: Secondary | ICD-10-CM

## 2020-05-07 LAB — TSH: TSH: 2.04 u[IU]/mL (ref 0.450–4.500)

## 2020-05-07 LAB — T4, FREE: Free T4: 1.2 ng/dL (ref 0.82–1.77)

## 2020-05-13 ENCOUNTER — Other Ambulatory Visit: Payer: Self-pay

## 2020-05-13 ENCOUNTER — Ambulatory Visit (HOSPITAL_COMMUNITY)
Admission: RE | Admit: 2020-05-13 | Discharge: 2020-05-13 | Disposition: A | Discharge status: Home / Self Care | Payer: Medicare Other | Source: Ambulatory Visit | Attending: "Endocrinology | Admitting: "Endocrinology

## 2020-05-13 DIAGNOSIS — E042 Nontoxic multinodular goiter: Secondary | ICD-10-CM | POA: Insufficient documentation

## 2020-05-15 ENCOUNTER — Ambulatory Visit: Payer: Medicare Other | Admitting: "Endocrinology

## 2020-05-15 ENCOUNTER — Other Ambulatory Visit: Payer: Self-pay

## 2020-05-15 ENCOUNTER — Encounter: Payer: Self-pay | Admitting: "Endocrinology

## 2020-05-15 VITALS — BP 123/67 | HR 60 | Ht 68.0 in | Wt 164.4 lb

## 2020-05-15 DIAGNOSIS — E042 Nontoxic multinodular goiter: Secondary | ICD-10-CM
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
## 2020-07-10 DIAGNOSIS — I129 Hypertensive chronic kidney disease with stage 1 through stage 4 chronic kidney disease, or unspecified chronic kidney disease: Secondary | ICD-10-CM | POA: Diagnosis not present

## 2020-07-10 DIAGNOSIS — N183 Chronic kidney disease, stage 3 unspecified: Secondary | ICD-10-CM | POA: Diagnosis not present

## 2020-07-15 ENCOUNTER — Other Ambulatory Visit: Payer: Self-pay | Admitting: Neurology

## 2020-07-19 DIAGNOSIS — E7489 Other specified disorders of carbohydrate metabolism: Secondary | ICD-10-CM | POA: Diagnosis not present

## 2020-07-19 DIAGNOSIS — Z1389 Encounter for screening for other disorder: Secondary | ICD-10-CM | POA: Diagnosis not present

## 2020-07-19 DIAGNOSIS — E039 Hypothyroidism, unspecified: Secondary | ICD-10-CM | POA: Diagnosis not present

## 2020-07-19 DIAGNOSIS — E538 Deficiency of other specified B group vitamins: Secondary | ICD-10-CM | POA: Diagnosis not present

## 2020-07-19 DIAGNOSIS — E782 Mixed hyperlipidemia: Secondary | ICD-10-CM | POA: Diagnosis not present

## 2020-07-19 DIAGNOSIS — Z0001 Encounter for general adult medical examination with abnormal findings: Secondary | ICD-10-CM | POA: Diagnosis not present

## 2020-07-19 DIAGNOSIS — E559 Vitamin D deficiency, unspecified: Secondary | ICD-10-CM | POA: Diagnosis not present

## 2020-07-19 DIAGNOSIS — E7849 Other hyperlipidemia: Secondary | ICD-10-CM | POA: Diagnosis not present

## 2020-07-19 DIAGNOSIS — Z6823 Body mass index (BMI) 23.0-23.9, adult: Secondary | ICD-10-CM | POA: Diagnosis not present

## 2020-07-19 DIAGNOSIS — I10 Essential (primary) hypertension: Secondary | ICD-10-CM | POA: Diagnosis not present

## 2020-07-19 DIAGNOSIS — M1991 Primary osteoarthritis, unspecified site: Secondary | ICD-10-CM | POA: Diagnosis not present

## 2020-08-10 DIAGNOSIS — I129 Hypertensive chronic kidney disease with stage 1 through stage 4 chronic kidney disease, or unspecified chronic kidney disease: Secondary | ICD-10-CM | POA: Diagnosis not present

## 2020-08-10 DIAGNOSIS — N183 Chronic kidney disease, stage 3 unspecified: Secondary | ICD-10-CM | POA: Diagnosis not present

## 2020-08-21 ENCOUNTER — Ambulatory Visit: Payer: Medicare Other | Admitting: Neurology

## 2020-08-21 ENCOUNTER — Encounter: Payer: Self-pay | Admitting: Neurology

## 2020-08-21 VITALS — BP 131/67 | HR 57 | Ht 68.0 in | Wt 156.0 lb

## 2020-08-21 DIAGNOSIS — G4762 Sleep related leg cramps: Secondary | ICD-10-CM

## 2020-08-21 DIAGNOSIS — G7000 Myasthenia gravis without (acute) exacerbation: Secondary | ICD-10-CM

## 2020-08-21 DIAGNOSIS — R569 Unspecified convulsions: Secondary | ICD-10-CM

## 2020-08-21 DIAGNOSIS — G609 Hereditary and idiopathic neuropathy, unspecified: Secondary | ICD-10-CM

## 2020-08-21 MED ORDER — LEVETIRACETAM 500 MG PO TABS: ORAL_TABLET | ORAL | 3 refills | Status: DC

## 2020-08-21 MED ORDER — PYRIDOSTIGMINE BROMIDE 60 MG PO TABS: ORAL_TABLET | ORAL | 3 refills | Status: DC

## 2020-08-21 NOTE — Progress Notes (Signed)
PATIENT: Kyle [name]. DOB: Nov 03, 1934  REASON FOR VISIT: follow up HISTORY FROM: patient  HISTORY OF PRESENT ILLNESS: Today 08/21/20 Kyle [name] is an 86 year old male with history of:  Ocular myasthenia gravis, only taking Mestinon.  Denies any double vision, sometimes his wife says his left eye droops.  He does not notice this.  Does okay with Mestinon, has occasional diarrhea, feels mostly related to his surgery from diverticulitis.  History of seizures, on Keppra, no seizures in 18 years.  He has peripheral neuropathy, takes gabapentin.  He has nocturnal leg cramps.  PCP switched from baclofen to tizanidine.  Working fairly well.  Still has night of nocturnal cramps.  The neuropathy is not especially bothersome, denies numbness, or pain to the feet.  Takes magnesium at lunch.  For chronic back pain, reports had [facility] at [facility].  Saw Dr. Ronnald Ramp with neurosurgery.  Surgery was offered, ultimately he decided to do nothing.  Is seeing a chiropractor once a week, has helped his neck.  Remains active, lives with his wife, drives a car.  Works in the yard, has a garden.  Wife tells him he needs to slow down, he says yes, but not today.  Here today for follow-up unaccompanied.  Update 11/22/2019 SS: Kyle [name] is an 86 year old male with history of ocular myasthenia gravis, only taking Mestinon.  History of seizures, on Keppra, no seizures in 18 years.  He has peripheral neuropathy, takes gabapentin.  He has nocturnal leg cramps, on baclofen.  A few months ago, developed left hip pain and back pain, went to [facility], had a series of injections without benefit, reports MRI of the lumbar spine, showed spinal stenosis.  A referral to neurosurgery has been initiated, pending appointment. He thinks he will decide to do nothing for now.  Over time, has noted weakness in both legs.  He remains quite active, working in the yard.  No falls. Still deals with nocturnal leg cramps, will wake  during the night with painful cramps, will get up and walk around, take a spoon full of mustard or vinegar with good benefit.  Denies blurred vision or double vision. Presents today for evaluation unaccompanied.   HISTORY 02/22/2019 Dr. Jannifer Franklin: Kyle [name] is a an 86 year old right-handed white male with a history of ocular myasthenia gravis treated only with Mestinon.  He is doing quite well with this since the recent increase in dose of the Mestinon.  He has not had any seizures in 17 years, he currently is on Keppra and tolerates the medication well.  He does have some numbness in the feet, he was diagnosed with a peripheral neuropathy recently, he takes gabapentin only in the evenings which has helped his ability to rest and sleep.  The patient continues to have nocturnal leg cramps, he is not sure that the increase in the Mestinon worsened this.  He will have cramps in the feet or in the calf muscles of either leg about 3 or 4 nights out of the week.  He has to get up and stretch, he sometimes will take mustard for this.  The patient denies any significant balance issues, he has not had any falls.  He returns to the office today for an evaluation.   REVIEW OF SYSTEMS: Out of a complete 14 system review of symptoms, the patient complains only of the following symptoms, and all other reviewed systems are negative.  See HPI  ALLERGIES: Allergies  Allergen Reactions  . Iodinated Diagnostic  Agents Hives     pt was premedicated/hives reaction occurred 24 hours post injection, Onset Date: [id number]   . Sulfa Antibiotics Swelling and Rash  . Bactericin [Bacitracin] Other (See Comments)    Unknown reaction     HOME MEDICATIONS: Outpatient Medications Prior to Visit  Medication Sig Dispense Refill  . clobetasol (TEMOVATE) 0.05 % external solution Apply 1 application topically daily as needed (rash).     . diclofenac (VOLTAREN) 75 MG EC tablet Take 1 tablet (75 mg total) by mouth 2 (two) times  daily. 180 tablet 3  . fish oil-omega-3 fatty acids 1000 MG capsule Take 1 g by mouth 3 (three) times daily.    Marland Kitchen gabapentin (NEURONTIN) 600 MG tablet TAKE 1 TABLET BY MOUTH  DAILY 90 tablet 3  . Garlic (GARLIQUE PO) Take 1 tablet by mouth daily.    Marland Kitchen losartan (COZAAR) 25 MG tablet Take 50 mg by mouth at bedtime.     Marland Kitchen MAGNESIUM CITRATE PO Take 1 tablet by mouth daily.    . niacinamide 500 MG tablet Take 500 mg by mouth 3 (three) times daily.    Vladimir Faster Glycol-Propyl Glycol 0.4-0.3 % SOLN Place 1 drop into both eyes 3 (three) times daily as needed (for dry eyes).    . simvastatin (ZOCOR) 20 MG tablet Take 20 mg by mouth at bedtime.     Marland Kitchen tiZANidine (ZANAFLEX) 4 MG tablet Take 4 mg by mouth at bedtime as needed.    . levETIRAcetam (KEPPRA) 500 MG tablet TAKE 1/2 TABLET BY MOUTH IN THE MORNING AND 1 TABLET IN THE EVENING 135 tablet 3  . pyridostigmine (MESTINON) 60 MG tablet TAKE 1 AND 1/2 TABLETS BY  MOUTH 4 TIMES DAILY 540 tablet 3  . baclofen (LIORESAL) 10 MG tablet Take 1 tablet (10 mg total) by mouth at bedtime. (Patient not taking: Reported on 08/21/2020) 30 each 11   No facility-administered medications prior to visit.    PAST MEDICAL HISTORY: Past Medical History:  Diagnosis Date  . Arthritis   . Auto immune neutropenia (HCC)   . Bullous pemphigoid   . Cancer [facility])    Prostate  . Cervical spondylosis without myelopathy 01/22/2016  . DVT (deep venous thrombosis) ([place])    a. Has had a previous right lower extremity DVT in the setting of hospitalization and surgery (after his brain tumor was resected in 1993) but is no longer on Coumadin.  Marland Kitchen Dyslipidemia   . History of prostate cancer   . Hypertension   . Meningioma ([place])    a. s/p resection 1990s.  . Myasthenia ([place]) 02/14/2013  . Nocturnal leg cramps   . Ocular myasthenia gravis ([place])   . Peripheral neuropathy 11/23/2018  . Seizures ([place])   . Sinus bradycardia   . Sleep paralysis   . Thyroid nodule, cold     PAST  SURGICAL HISTORY: Past Surgical History:  Procedure Laterality Date  . BRAIN SURGERY    . CHOLECYSTECTOMY    . COLONOSCOPY N/A 07/25/2013   Procedure: COLONOSCOPY;  Surgeon: Jamesetta So, MD;  Location: [facility];  Service: Gastroenterology;  Laterality: N/A;  . COLONOSCOPY N/A 11/10/2016   Procedure: COLONOSCOPY;  Surgeon: Aviva Signs, MD;  Location: [facility];  Service: Gastroenterology;  Laterality: N/A;  . IR CATHETER TUBE CHANGE  01/02/2017  . IR RADIOLOGIST EVAL & MGMT  01/06/2017  . IR RADIOLOGIST EVAL & MGMT  01/20/2017  . IR RADIOLOGIST EVAL & MGMT  02/03/2017  . IR  RADIOLOGIST EVAL & MGMT  02/17/2017  . IR SINUS/FIST TUBE CHK-NON GI  03/03/2017  . RADIOACTIVE SEED IMPLANT      FAMILY HISTORY: Family History  Problem Relation Age of Onset  . Heart failure Mother   . Diabetes Mother   . Diabetes Sister   . Dementia Sister   . Lung cancer Son   . Heart failure Daughter   . Colon cancer Neg Hx     SOCIAL HISTORY: Social History   Socioeconomic History  . Marital status: Married    Spouse name: Not on file  . Number of children: 1  . Years of education: 95 TH  . Highest education level: Not on file  Occupational History  . Occupation: retired  Tobacco Use  . Smoking status: Never Smoker  . Smokeless tobacco: Never Used  Substance and Sexual Activity  . Alcohol use: No  . Drug use: No  . Sexual activity: Not on file  Other Topics Concern  . Not on file  Social History Narrative   Patient is right handed.   Patient drinks 1 cup of coffee daily.   Social Determinants of Health   Financial Resource Strain: Not on file  Food Insecurity: Not on file  Transportation Needs: Not on file  Physical Activity: Not on file  Stress: Not on file  Social Connections: Not on file  Intimate Partner Violence: Not on file   PHYSICAL EXAM  Vitals:   08/21/20 0730  BP: 131/67  Pulse: (!) 57  Weight: 156 lb (70.8 kg)  Height: 5\' 8"  (1.727 m)   Body mass  index is 23.72 kg/m.  Generalized: Well developed, in no acute distress  Neurological examination  Mentation: Alert oriented to time, place, history taking. Follows all commands speech and language fluent Cranial nerve II-XII: Pupils were equal round reactive to light. Extraocular movements were full, visual field were full on confrontational test. Facial sensation and strength were normal. Head turning and shoulder shrug  were normal and symmetric.  No ptosis noted. Motor: Good strength all extremities, no muscle weakness noted Sensory: Sensory testing is intact to soft touch on all 4 extremities. No evidence of extinction is noted.  Coordination: Cerebellar testing reveals good finger-nose-finger and heel-to-shin bilaterally.  Gait and station: Gait is normal, slightly wide-based, cautious, but steady Reflexes: Deep tendon reflexes are symmetric and normal   DIAGNOSTIC DATA (LABS, IMAGING, TESTING) - I reviewed patient records, labs, notes, testing and imaging myself where available.  Lab Results  Component Value Date   WBC 9.2 03/03/2017   HGB 13.6 03/03/2017   HCT 40.4 03/03/2017   MCV 93.1 03/03/2017   PLT 192 03/03/2017      Component Value Date/Time   NA 141 03/03/2017 1024   K 3.9 03/03/2017 1024   CL 106 03/03/2017 1024   CO2 27 03/03/2017 1024   GLUCOSE 89 03/03/2017 1024   BUN 20 03/03/2017 1024   CREATININE 0.68 03/03/2017 1024   CALCIUM 9.6 03/03/2017 1024   PROT 6.9 11/23/2018 1518   ALBUMIN 4.1 03/03/2017 1024   AST 22 03/03/2017 1024   ALT 19 03/03/2017 1024   ALKPHOS 59 03/03/2017 1024   BILITOT 0.9 03/03/2017 1024   GFRNONAA >60 03/03/2017 1024   GFRAA >60 03/03/2017 1024   Lab Results  Component Value Date   CHOL 182 10/26/2014   HDL 67 10/26/2014   LDLCALC 89 10/26/2014   TRIG 130 10/26/2014   CHOLHDL 2.7 10/26/2014   Lab Results  Component  Value Date   HGBA1C 5.9 (H) 10/25/2014   Lab Results  Component Value Date   VITAMINB12 292  11/23/2018   Lab Results  Component Value Date   TSH 2.040 05/06/2020   ASSESSMENT AND PLAN 86 y.o. year old male  has a past medical history of Arthritis, Auto immune neutropenia ([place]), Bullous pemphigoid, Cancer ([place]), Cervical spondylosis without myelopathy (01/22/2016), DVT (deep venous thrombosis) ([place]), Dyslipidemia, History of prostate cancer, Hypertension, Meningioma ([place]), Myasthenia ([place]) (02/14/2013), Nocturnal leg cramps, Ocular myasthenia gravis ([place]), Peripheral neuropathy (11/23/2018), Seizures ([place]), Sinus bradycardia, Sleep paralysis, and Thyroid nodule, cold. here with:  1. Peripheral neuropathy -Continue gabapentin 600 mg at bedtime -No falls, remains active, has a garden  2. Ocular myasthenia gravis -Overall stable -Continue Mestinon 60 mg, 1.5 tablets 4 times daily, tolerating well   3. History of seizures -No recurrent seizure in several years -Continue Keppra 500 mg, 1/2 tablet in the morning, 1 tablet in the evening  4. Nocturnal leg cramps -On tizanidine from PCP, stopped baclofen  5. Reported lumbar stenosis -MRI is not available to me, done at [facility] per patient -Saw neurosurgery, decided against surgery, seeing chiropractor -[facility] in August 2020, showed a primarily motor neuropathy no evidence of overlying lumbosacral radiculopathy  Follow-up in our office in 8-9 months or sooner if needed.  Butler Denmark, AGNP-C, DNP 08/21/2020, 8:19 AM [facility] [address] [place], [place] [address] [phone]

## 2020-08-21 NOTE — Patient Instructions (Signed)
Continue current medications  See you back in 8-9 months

## 2020-08-21 NOTE — Progress Notes (Signed)
I have read the note, and I agree with the clinical assessment and plan.  Khale Nigh K November Sypher   

## 2020-09-05 DIAGNOSIS — Z23 Encounter for immunization: Secondary | ICD-10-CM | POA: Diagnosis not present

## 2020-09-10 DIAGNOSIS — N183 Chronic kidney disease, stage 3 unspecified: Secondary | ICD-10-CM | POA: Diagnosis not present

## 2020-09-10 DIAGNOSIS — I129 Hypertensive chronic kidney disease with stage 1 through stage 4 chronic kidney disease, or unspecified chronic kidney disease: Secondary | ICD-10-CM | POA: Diagnosis not present

## 2020-10-10 ENCOUNTER — Encounter (HOSPITAL_COMMUNITY): Payer: Self-pay | Admitting: Emergency Medicine

## 2020-10-10 ENCOUNTER — Emergency Department (HOSPITAL_COMMUNITY): Payer: Medicare Other

## 2020-10-10 ENCOUNTER — Observation Stay (HOSPITAL_BASED_OUTPATIENT_CLINIC_OR_DEPARTMENT_OTHER): Payer: Medicare Other

## 2020-10-10 ENCOUNTER — Other Ambulatory Visit: Payer: Self-pay

## 2020-10-10 ENCOUNTER — Inpatient Hospital Stay (HOSPITAL_COMMUNITY)
Admission: EM | Admit: 2020-10-10 | Discharge: 2020-10-14 | DRG: 310 | Disposition: A | Discharge status: Home / Self Care | Payer: Medicare Other | Source: Ambulatory Visit | Attending: Family Medicine | Admitting: Family Medicine

## 2020-10-10 DIAGNOSIS — Z86011 Personal history of benign neoplasm of the brain: Secondary | ICD-10-CM | POA: Diagnosis not present

## 2020-10-10 DIAGNOSIS — I129 Hypertensive chronic kidney disease with stage 1 through stage 4 chronic kidney disease, or unspecified chronic kidney disease: Secondary | ICD-10-CM | POA: Diagnosis not present

## 2020-10-10 DIAGNOSIS — Z6822 Body mass index (BMI) 22.0-22.9, adult: Secondary | ICD-10-CM | POA: Diagnosis not present

## 2020-10-10 DIAGNOSIS — G629 Polyneuropathy, unspecified: Secondary | ICD-10-CM | POA: Diagnosis present

## 2020-10-10 DIAGNOSIS — E785 Hyperlipidemia, unspecified: Secondary | ICD-10-CM | POA: Diagnosis not present

## 2020-10-10 DIAGNOSIS — R531 Weakness: Secondary | ICD-10-CM | POA: Diagnosis not present

## 2020-10-10 DIAGNOSIS — G7000 Myasthenia gravis without (acute) exacerbation: Secondary | ICD-10-CM | POA: Diagnosis present

## 2020-10-10 DIAGNOSIS — I350 Nonrheumatic aortic (valve) stenosis: Secondary | ICD-10-CM

## 2020-10-10 DIAGNOSIS — M549 Dorsalgia, unspecified: Secondary | ICD-10-CM | POA: Diagnosis present

## 2020-10-10 DIAGNOSIS — Z20822 Contact with and (suspected) exposure to covid-19: Secondary | ICD-10-CM | POA: Diagnosis present

## 2020-10-10 DIAGNOSIS — Z833 Family history of diabetes mellitus: Secondary | ICD-10-CM | POA: Diagnosis not present

## 2020-10-10 DIAGNOSIS — Z882 Allergy status to sulfonamides status: Secondary | ICD-10-CM

## 2020-10-10 DIAGNOSIS — Z79899 Other long term (current) drug therapy: Secondary | ICD-10-CM | POA: Diagnosis not present

## 2020-10-10 DIAGNOSIS — R569 Unspecified convulsions: Secondary | ICD-10-CM

## 2020-10-10 DIAGNOSIS — Z86718 Personal history of other venous thrombosis and embolism: Secondary | ICD-10-CM | POA: Diagnosis not present

## 2020-10-10 DIAGNOSIS — I480 Paroxysmal atrial fibrillation: Secondary | ICD-10-CM | POA: Diagnosis not present

## 2020-10-10 DIAGNOSIS — G8929 Other chronic pain: Secondary | ICD-10-CM | POA: Diagnosis present

## 2020-10-10 DIAGNOSIS — R0609 Other forms of dyspnea: Secondary | ICD-10-CM

## 2020-10-10 DIAGNOSIS — I10 Essential (primary) hypertension: Secondary | ICD-10-CM | POA: Diagnosis present

## 2020-10-10 DIAGNOSIS — Z8546 Personal history of malignant neoplasm of prostate: Secondary | ICD-10-CM

## 2020-10-10 DIAGNOSIS — N183 Chronic kidney disease, stage 3 unspecified: Secondary | ICD-10-CM | POA: Diagnosis not present

## 2020-10-10 DIAGNOSIS — W57XXXA Bitten or stung by nonvenomous insect and other nonvenomous arthropods, initial encounter: Secondary | ICD-10-CM | POA: Diagnosis not present

## 2020-10-10 DIAGNOSIS — R0602 Shortness of breath: Secondary | ICD-10-CM | POA: Diagnosis not present

## 2020-10-10 DIAGNOSIS — Z888 Allergy status to other drugs, medicaments and biological substances status: Secondary | ICD-10-CM | POA: Diagnosis not present

## 2020-10-10 DIAGNOSIS — Z801 Family history of malignant neoplasm of trachea, bronchus and lung: Secondary | ICD-10-CM

## 2020-10-10 DIAGNOSIS — I4892 Unspecified atrial flutter: Secondary | ICD-10-CM | POA: Diagnosis present

## 2020-10-10 DIAGNOSIS — R7989 Other specified abnormal findings of blood chemistry: Secondary | ICD-10-CM | POA: Diagnosis not present

## 2020-10-10 DIAGNOSIS — G40909 Epilepsy, unspecified, not intractable, without status epilepticus: Secondary | ICD-10-CM | POA: Diagnosis present

## 2020-10-10 DIAGNOSIS — Z881 Allergy status to other antibiotic agents status: Secondary | ICD-10-CM | POA: Diagnosis not present

## 2020-10-10 DIAGNOSIS — Z8249 Family history of ischemic heart disease and other diseases of the circulatory system: Secondary | ICD-10-CM | POA: Diagnosis not present

## 2020-10-10 DIAGNOSIS — I483 Typical atrial flutter: Secondary | ICD-10-CM | POA: Diagnosis not present

## 2020-10-10 DIAGNOSIS — I4891 Unspecified atrial fibrillation: Secondary | ICD-10-CM | POA: Diagnosis present

## 2020-10-10 DIAGNOSIS — R5383 Other fatigue: Secondary | ICD-10-CM | POA: Diagnosis not present

## 2020-10-10 DIAGNOSIS — E063 Autoimmune thyroiditis: Secondary | ICD-10-CM | POA: Diagnosis not present

## 2020-10-10 DIAGNOSIS — R000 Tachycardia, unspecified: Secondary | ICD-10-CM | POA: Diagnosis not present

## 2020-10-10 LAB — CBC WITH DIFFERENTIAL/PLATELET
Abs Immature Granulocytes: 0.03 10*3/uL (ref 0.00–0.07)
Basophils Absolute: 0 10*3/uL (ref 0.0–0.1)
Basophils Relative: 0 %
Eosinophils Absolute: 0 10*3/uL (ref 0.0–0.5)
Eosinophils Relative: 0 %
HCT: 42.9 % (ref 39.0–52.0)
Hemoglobin: 14.3 g/dL (ref 13.0–17.0)
Immature Granulocytes: 1 %
Lymphocytes Relative: 9 %
Lymphs Abs: 0.5 10*3/uL — ABNORMAL LOW (ref 0.7–4.0)
MCH: 31.8 pg (ref 26.0–34.0)
MCHC: 33.3 g/dL (ref 30.0–36.0)
MCV: 95.5 fL (ref 80.0–100.0)
Monocytes Absolute: 0.4 10*3/uL (ref 0.1–1.0)
Monocytes Relative: 8 %
Neutro Abs: 4.2 10*3/uL (ref 1.7–7.7)
Neutrophils Relative %: 82 %
Platelets: 150 10*3/uL (ref 150–400)
RBC: 4.49 MIL/uL (ref 4.22–5.81)
RDW: 13.2 % (ref 11.5–15.5)
WBC: 5.1 10*3/uL (ref 4.0–10.5)
nRBC: 0 % (ref 0.0–0.2)

## 2020-10-10 LAB — COMPREHENSIVE METABOLIC PANEL
ALT: 81 U/L — ABNORMAL HIGH (ref 0–44)
AST: 84 U/L — ABNORMAL HIGH (ref 15–41)
Albumin: 3.8 g/dL (ref 3.5–5.0)
Alkaline Phosphatase: 62 U/L (ref 38–126)
Anion gap: 11 (ref 5–15)
BUN: 30 mg/dL — ABNORMAL HIGH (ref 8–23)
CO2: 24 mmol/L (ref 22–32)
Calcium: 9.8 mg/dL (ref 8.9–10.3)
Chloride: 103 mmol/L (ref 98–111)
Creatinine, Ser: 1.16 mg/dL (ref 0.61–1.24)
GFR, Estimated: >60 mL/min (ref >60)
Glucose, Bld: 141 mg/dL — ABNORMAL HIGH (ref 70–99)
Potassium: 4 mmol/L (ref 3.5–5.1)
Sodium: 138 mmol/L (ref 135–145)
Total Bilirubin: 0.7 mg/dL (ref 0.3–1.2)
Total Protein: 7.1 g/dL (ref 6.5–8.1)

## 2020-10-10 LAB — TSH: TSH: 2.438 u[IU]/mL (ref 0.350–4.500)

## 2020-10-10 LAB — ECHOCARDIOGRAM COMPLETE
AR max vel: 1.39 cm2
AV Area VTI: 1.39 cm2
AV Area mean vel: 1.22 cm2
AV Mean grad: 10 mmHg
AV Peak grad: 16.5 mmHg
Ao pk vel: 2.03 m/s
Area-P 1/2: 4.65 cm2
Height: 68 in
MV VTI: 2.78 cm2
S' Lateral: 2.53 cm
Weight: 2464 oz

## 2020-10-10 LAB — RESP PANEL BY RT-PCR (FLU A&B, COVID) ARPGX2
Influenza A by PCR: NEGATIVE
Influenza B by PCR: NEGATIVE
SARS Coronavirus 2 by RT PCR: NEGATIVE

## 2020-10-10 LAB — GLUCOSE, CAPILLARY: Glucose-Capillary: 165 mg/dL — ABNORMAL HIGH (ref 70–99)

## 2020-10-10 LAB — MAGNESIUM: Magnesium: 1.8 mg/dL (ref 1.7–2.4)

## 2020-10-10 LAB — HEPARIN LEVEL (UNFRACTIONATED): Heparin Unfractionated: 0.33 IU/mL (ref 0.30–0.70)

## 2020-10-10 LAB — TROPONIN I (HIGH SENSITIVITY)
Troponin I (High Sensitivity): 60 ng/L — ABNORMAL HIGH (ref <18)
Troponin I (High Sensitivity): 61 ng/L — ABNORMAL HIGH (ref <18)

## 2020-10-10 LAB — PROTIME-INR
INR: 1 (ref 0.8–1.2)
Prothrombin Time: 13.1 seconds (ref 11.4–15.2)

## 2020-10-10 LAB — MRSA NEXT GEN BY PCR, NASAL: MRSA by PCR Next Gen: NOT DETECTED

## 2020-10-10 MED ORDER — HEPARIN BOLUS VIA INFUSION
3500.0000 [IU] | Freq: Once | INTRAVENOUS | Status: AC
  Administered 2020-10-10: 3500 [IU] via INTRAVENOUS

## 2020-10-10 MED ORDER — ACETAMINOPHEN 650 MG RE SUPP: 650.0000 mg | Freq: Four times a day (QID) | RECTAL | Status: DC | PRN

## 2020-10-10 MED ORDER — ONDANSETRON HCL 4 MG/2ML IJ SOLN: 4.0000 mg | Freq: Four times a day (QID) | INTRAMUSCULAR | Status: DC | PRN

## 2020-10-10 MED ORDER — BISACODYL 10 MG RE SUPP: 10.0000 mg | Freq: Every day | RECTAL | Status: DC | PRN

## 2020-10-10 MED ORDER — NIACINAMIDE 500 MG PO TABS: 500.0000 mg | ORAL_TABLET | Freq: Three times a day (TID) | ORAL | Status: DC

## 2020-10-10 MED ORDER — PYRIDOSTIGMINE BROMIDE 60 MG PO TABS
90.0000 mg | ORAL_TABLET | Freq: Four times a day (QID) | ORAL | Status: DC
  Administered 2020-10-10 – 2020-10-14 (×13): 90 mg via ORAL
  Filled 2020-10-10 (×13): qty 2

## 2020-10-10 MED ORDER — ACETAMINOPHEN 325 MG PO TABS
650.0000 mg | ORAL_TABLET | Freq: Four times a day (QID) | ORAL | Status: DC | PRN
  Administered 2020-10-11: 650 mg via ORAL
  Filled 2020-10-10: qty 2

## 2020-10-10 MED ORDER — DILTIAZEM HCL 25 MG/5ML IV SOLN
10.0000 mg | Freq: Once | INTRAVENOUS | Status: AC
  Administered 2020-10-10: 10 mg via INTRAVENOUS
  Filled 2020-10-10: qty 5

## 2020-10-10 MED ORDER — ADULT MULTIVITAMIN W/MINERALS CH
1.0000 | ORAL_TABLET | Freq: Every day | ORAL | Status: DC
  Administered 2020-10-11 – 2020-10-14 (×4): 1 via ORAL
  Filled 2020-10-10 (×4): qty 1

## 2020-10-10 MED ORDER — POLYETHYLENE GLYCOL 3350 17 G PO PACK: 17.0000 g | PACK | Freq: Every day | ORAL | Status: DC | PRN

## 2020-10-10 MED ORDER — DILTIAZEM HCL-DEXTROSE 125-5 MG/125ML-% IV SOLN (PREMIX)
5.0000 mg/h | INTRAVENOUS | Status: DC
  Administered 2020-10-10: 5 mg/h via INTRAVENOUS
  Administered 2020-10-11 (×2): 15 mg/h via INTRAVENOUS
  Administered 2020-10-12: 10 mg/h via INTRAVENOUS
  Filled 2020-10-10 (×6): qty 125

## 2020-10-10 MED ORDER — CHLORHEXIDINE GLUCONATE CLOTH 2 % EX PADS
6.0000 | MEDICATED_PAD | Freq: Every day | CUTANEOUS | Status: DC
  Administered 2020-10-10 – 2020-10-14 (×5): 6 via TOPICAL

## 2020-10-10 MED ORDER — SIMVASTATIN 20 MG PO TABS
20.0000 mg | ORAL_TABLET | Freq: Every day | ORAL | Status: DC
  Administered 2020-10-10 – 2020-10-13 (×4): 20 mg via ORAL
  Filled 2020-10-10 (×4): qty 1

## 2020-10-10 MED ORDER — TRAZODONE HCL 50 MG PO TABS
50.0000 mg | ORAL_TABLET | Freq: Every evening | ORAL | Status: DC | PRN
  Administered 2020-10-11 – 2020-10-13 (×3): 50 mg via ORAL
  Filled 2020-10-10 (×4): qty 1

## 2020-10-10 MED ORDER — OMEGA-3-ACID ETHYL ESTERS 1 G PO CAPS
1.0000 g | ORAL_CAPSULE | Freq: Two times a day (BID) | ORAL | Status: DC
  Administered 2020-10-10 – 2020-10-14 (×8): 1 g via ORAL
  Filled 2020-10-10 (×8): qty 1

## 2020-10-10 MED ORDER — ONDANSETRON HCL 4 MG PO TABS: 4.0000 mg | ORAL_TABLET | Freq: Four times a day (QID) | ORAL | Status: DC | PRN

## 2020-10-10 MED ORDER — HEPARIN (PORCINE) 25000 UT/250ML-% IV SOLN
1150.0000 [IU]/h | INTRAVENOUS | Status: DC
  Administered 2020-10-10 – 2020-10-12 (×3): 1000 [IU]/h via INTRAVENOUS
  Administered 2020-10-13: 1150 [IU]/h via INTRAVENOUS
  Filled 2020-10-10 (×4): qty 250

## 2020-10-10 MED ORDER — SODIUM CHLORIDE 0.9 % IV BOLUS
500.0000 mL | Freq: Once | INTRAVENOUS | Status: AC
  Administered 2020-10-10: 500 mL via INTRAVENOUS

## 2020-10-10 MED ORDER — POLYVINYL ALCOHOL 1.4 % OP SOLN: 1.0000 [drp] | Freq: Three times a day (TID) | OPHTHALMIC | Status: DC | PRN

## 2020-10-10 MED ORDER — SODIUM CHLORIDE 0.9% FLUSH
3.0000 mL | Freq: Two times a day (BID) | INTRAVENOUS | Status: DC
  Administered 2020-10-11 – 2020-10-14 (×5): 3 mL via INTRAVENOUS

## 2020-10-10 MED ORDER — SODIUM CHLORIDE 0.9 % IV SOLN: 250.0000 mL | INTRAVENOUS | Status: DC | PRN

## 2020-10-10 MED ORDER — TIZANIDINE HCL 2 MG PO TABS
4.0000 mg | ORAL_TABLET | Freq: Every evening | ORAL | Status: DC | PRN
  Administered 2020-10-10 – 2020-10-12 (×3): 4 mg via ORAL
  Filled 2020-10-10 (×3): qty 2

## 2020-10-10 MED ORDER — LEVETIRACETAM 500 MG PO TABS
500.0000 mg | ORAL_TABLET | Freq: Two times a day (BID) | ORAL | Status: DC
  Administered 2020-10-10 – 2020-10-14 (×8): 500 mg via ORAL
  Filled 2020-10-10 (×8): qty 1

## 2020-10-10 MED ORDER — SODIUM CHLORIDE 0.9% FLUSH: 3.0000 mL | INTRAVENOUS | Status: DC | PRN

## 2020-10-10 MED ORDER — GABAPENTIN 300 MG PO CAPS
600.0000 mg | ORAL_CAPSULE | Freq: Every day | ORAL | Status: DC
  Administered 2020-10-11 – 2020-10-14 (×4): 600 mg via ORAL
  Filled 2020-10-10 (×4): qty 2

## 2020-10-10 MED ORDER — SODIUM CHLORIDE 0.9% FLUSH
3.0000 mL | Freq: Two times a day (BID) | INTRAVENOUS | Status: DC
  Administered 2020-10-11 – 2020-10-14 (×3): 3 mL via INTRAVENOUS

## 2020-10-10 NOTE — H&P (Signed)
Patient Demographics:    Kyle [name], is a 86 y.o. male  MRN: [id number]   DOB - 12-16-1934  Admit Date - 10/10/2020  Outpatient Primary MD for the patient is Redmond School, MD   Assessment & Plan:    Principal Problem:   New onset a-fib [facility]) Active Problems:   Aortic stenosis, moderate/Aortic valve mean gradient is 10.0 mmHg   Ocular Myasthenia (HCC)   Convulsions/seizures (HCC)   Essential hypertension, benign    1) new onset A. fib/a flutter with RVR----continue IV Cardizem drip for rate control -Patient was started on IV heparin by EDP continue same for now -TSH WNL, electrolytes WNL Echo with EF of 65 to 70%, severe LVH noted, moderate left atrial enlargement noted, right atrium is not enlarged, moderate aortic stenosis noted  CHA2DS2- VASc score   is = at least 3 based on age and history of HTN    which is  equal to = 3.2 % annual risk of stroke  This patients CHA2DS2-VASc Score and unadjusted Ischemic Stroke Rate (% per year) is equal to 3.2 % stroke rate/year from a score of 3   2)HTN--losartan, continue IV Cardizem drip for rate control for A. fib a flutter as above #1  3) ocular myasthenia gravis--- continue Mestinon  4) polyneuropathy----continue gabapentin  5)Sz disorder--- continue Keppra  6) mild LFT elevation--- AST is 84 and  ALT is 81, T bili 0.7 alk phos 62 -Okay to continue simvastatin, monitor LFTs closely  7) moderate aortic stenosis--- noted on echo from 10/10/2020 outpatient follow-up with cardiology for ongoing monitoring advised  -Aortic valve mean gradient  measures 10.0 mmHg. Aortic valve peak gradient measures 16.5 mmHg. Aortic  valve area, by VTI measures 1.39 cm.   Disposition/Need for in-Hospital Stay- patient unable to be discharged at this time due to -A. fib with  RVR requiring IV Cardizem for rate control and IV heparin for anticoagulation*   Dispo: The patient is from: Home              Anticipated d/c is to: Home              Anticipated d/c date is: 1 day              Patient currently is not medically stable to d/c. Barriers: Not Clinically Stable-    With History of - Reviewed by me  Past Medical History:  Diagnosis Date   Arthritis    Auto immune neutropenia (HCC)    Bullous pemphigoid    Cancer (HCC)    Prostate   Cervical spondylosis without myelopathy 01/22/2016   DVT (deep venous thrombosis) ([place])    a. Has had a previous right lower extremity DVT in the setting of hospitalization and surgery (after his brain tumor was resected in 1993) but is no longer on Coumadin.   Dyslipidemia    History of prostate cancer    Hypertension    Meningioma (  [place])    a. s/p resection 1990s.   Myasthenia ([place]) 02/14/2013   Nocturnal leg cramps    Ocular myasthenia gravis ([place])    Peripheral neuropathy 11/23/2018   Seizures ([place])    Sinus bradycardia    Sleep paralysis    Thyroid nodule, cold       Past Surgical History:  Procedure Laterality Date   BRAIN SURGERY     CHOLECYSTECTOMY     COLONOSCOPY N/A 07/25/2013   Procedure: COLONOSCOPY;  Surgeon: Jamesetta So, MD;  [facility];  Service: Gastroenterology;  Laterality: N/A;   COLONOSCOPY N/A 11/10/2016   Procedure: COLONOSCOPY;  Surgeon: Aviva Signs, MD;  Location: [facility];  Service: Gastroenterology;  Laterality: N/A;   IR CATHETER TUBE CHANGE  01/02/2017   IR RADIOLOGIST EVAL & MGMT  01/06/2017   IR RADIOLOGIST EVAL & MGMT  01/20/2017   IR RADIOLOGIST EVAL & MGMT  02/03/2017   IR RADIOLOGIST EVAL & MGMT  02/17/2017   IR SINUS/FIST TUBE CHK-NON GI  03/03/2017   RADIOACTIVE SEED IMPLANT       Chief Complaint  Patient presents with   Tachycardia      HPI:    Kyle [name]  is a 86 y.o. male with past medical history relevant for nontoxic multinodular goiter,  history of meningioma status post prior  resection, ocular myasthenia gravis on Mestinon,History of seizures, on Keppra, no seizures in almost 20 years,  has peripheral neuropathy, takes gabapentin, history of chronic back pain, HTN, history of prostate cancer, prior history of DVT--sent over from PCPs office Dr. Gerarda Fraction due to dyspnea, weakness and found to be in A. fib/a flutter with RVR in the ED--EDP gave IV digoxin and IV Cardizem -EKG consistent with atrial flutter -Chest x-ray without acute findings -TSH is 2.43 -CBC WNL -Troponin is 60 in the setting of tachyarrhythmia -Creatinine is 1.16, AST is 84 and  ALT is 81, T bili 0.7 alk phos 62 -Given persistent tachycardia EDP requested hospitalist to admit -Patient denies significant chest pain he did admit dyspnea--- COVID-negative   Review of systems:    In addition to the HPI above,   A full Review of  Systems was done, all other systems reviewed are negative except as noted above in HPI , .    Social History:  Reviewed by me    Social History   Tobacco Use   Smoking status: Never   Smokeless tobacco: Never  Substance Use Topics   Alcohol use: No       Family History :  Reviewed by me    Family History  Problem Relation Age of Onset   Heart failure Mother    Diabetes Mother    Diabetes Sister    Dementia Sister    Lung cancer Son    Heart failure Daughter    Colon cancer Neg Hx      Home Medications:   Prior to Admission medications   Medication Sig Start Date End Date Taking? Authorizing Provider  fish oil-omega-3 fatty acids 1000 MG capsule Take 1 g by mouth 3 (three) times daily.   Yes [provider]  gabapentin (NEURONTIN) 600 MG tablet TAKE 1 TABLET BY MOUTH  DAILY Patient taking differently: Take 600 mg by mouth daily. 07/16/20  Yes Suzzanne Cloud, NP  Garlic (GARLIQUE PO) Take 1 tablet by mouth daily.   Yes [provider]  levETIRAcetam (KEPPRA) 500 MG tablet Take 1/2 tablet in the  morning, take  1 tablet in the evening Patient taking differently: Take 250-500 mg by mouth 2 (two) times daily. Take 1/2 tablet in the morning, take 1 tablet in the evening 08/21/20  Yes Suzzanne Cloud, NP  losartan (COZAAR) 25 MG tablet Take 25 mg by mouth in the morning and at bedtime. 06/02/16  Yes [provider]  MAGNESIUM CITRATE PO Take 1 tablet by mouth daily.   Yes [provider]  niacinamide 500 MG tablet Take 500 mg by mouth 3 (three) times daily.   Yes [provider]  Polyethyl Glycol-Propyl Glycol 0.4-0.3 % SOLN Place 1 drop into both eyes 3 (three) times daily as needed (for dry eyes).   Yes [provider]  pyridostigmine (MESTINON) 60 MG tablet TAKE 1 AND 1/2 TABLETS BY  MOUTH 4 TIMES DAILY Patient taking differently: Take 90 mg by mouth 4 (four) times daily. 08/21/20  Yes Suzzanne Cloud, NP  simvastatin (ZOCOR) 20 MG tablet Take 20 mg by mouth at bedtime.  06/02/16  Yes [provider]  tiZANidine (ZANAFLEX) 4 MG tablet Take 4 mg by mouth at bedtime as needed for muscle spasms. 07/19/20  Yes [provider]  diclofenac (VOLTAREN) 75 MG EC tablet Take 1 tablet (75 mg total) by mouth 2 (two) times daily. Patient not taking: Reported on 10/10/2020 02/22/20   Suzzanne Cloud, NP     Allergies:     Allergies  Allergen Reactions   Iodinated Diagnostic Agents Hives     pt was premedicated/hives reaction occurred 24 hours post injection, Onset Date: [id number]    Sulfa Antibiotics Swelling and Rash   Bactericin [Bacitracin] Other (See Comments)    Unknown reaction      Physical Exam:   Vitals  Blood pressure 134/81, pulse (!) 139, temperature 98.4 F (36.9 C), temperature source Oral, resp. rate (!) 23, height _0  (1.727 m), weight 69.4 kg, SpO2 100 %.  Physical Examination: General appearance - alert, and in no distress   Mental status - alert, oriented to person, place, and time,  Eyes - sclera anicteric Neck - supple, no  JVD elevation , Chest - clear  to auscultation bilaterally, symmetrical air movement,  Heart - S1 and S2 normal, irregularly irregular tachycardic Abdomen - soft, nontender, nondistended, no masses or organomegaly Neurological - screening mental status exam normal, neck supple without rigidity, cranial nerves II through XII intact, DTR's normal and symmetric Extremities - no pedal edema noted, intact peripheral pulses  Skin - warm, dry     Data Review:    CBC Recent Labs  Lab 10/10/20 0913  WBC 5.1  HGB 14.3  HCT 42.9  PLT 150  MCV 95.5  MCH 31.8  MCHC 33.3  RDW 13.2  LYMPHSABS 0.5*  MONOABS 0.4  EOSABS 0.0  BASOSABS 0.0   ------------------------------------------------------------------------------------------------------------------  Chemistries  Recent Labs  Lab 10/10/20 0913 10/10/20 1105  NA 138  --   K 4.0  --   CL 103  --   CO2 24  --   GLUCOSE 141*  --   BUN 30*  --   CREATININE 1.16  --   CALCIUM 9.8  --   MG  --  1.8  AST 84*  --   ALT 81*  --   ALKPHOS 62  --   BILITOT 0.7  --    ------------------------------------------------------------------------------------------------------------------ estimated creatinine clearance is 46.7 mL/min (by C-G formula based on SCr of 1.16 mg/dL). ------------------------------------------------------------------------------------------------------------------ Recent Labs    10/10/20 [phone]  TSH 2.438     Coagulation profile Recent Labs  Lab 10/10/20 0913  INR 1.0   ------------------------------------------------------------------------------------------------------------------- No results for input(s): DDIMER in the last 72 hours. -------------------------------------------------------------------------------------------------------------------  Cardiac Enzymes No results for input(s): CKMB, TROPONINI, MYOGLOBIN in the last 168 hours.  Invalid input(s):  CK ------------------------------------------------------------------------------------------------------------------    Component Value Date/Time   BNP 90.0 10/25/2014 0457     ---------------------------------------------------------------------------------------------------------------  Urinalysis    Component Value Date/Time   COLORURINE AMBER (A) 12/27/2016 1105   APPEARANCEUR CLEAR 12/27/2016 1105   LABSPEC 1.020 12/27/2016 1105   PHURINE 5.0 12/27/2016 1105   GLUCOSEU NEGATIVE 12/27/2016 1105   HGBUR NEGATIVE 12/27/2016 [place] 12/27/2016 1105   KETONESUR NEGATIVE 12/27/2016 1105   PROTEINUR NEGATIVE 12/27/2016 1105   NITRITE NEGATIVE 12/27/2016 1105   LEUKOCYTESUR MODERATE (A) 12/27/2016 1105    ----------------------------------------------------------------------------------------------------------------   Imaging Results:    DG Chest Portable 1 View  Result Date: 10/10/2020 CLINICAL DATA:  86 year old male with weakness and shortness of breath. Tachycardia. EXAM: PORTABLE CHEST 1 VIEW COMPARISON:  Chest radiographs 05/24/2015 and earlier. FINDINGS: Portable AP semi upright view at 0914 hours. Lung volumes and mediastinal contours are stable and within normal limits. Visualized tracheal air column is within normal limits. Chronic blunting of the right lateral costophrenic angle is stable. Allowing for portable technique the lungs are clear. No pneumothorax. No acute osseous abnormality identified. IMPRESSION: Negative portable chest. Electronically Signed   By: Genevie Ann M.D.   On: 10/10/2020 09:27   ECHOCARDIOGRAM COMPLETE  Result Date: 10/10/2020    ECHOCARDIOGRAM REPORT   Patient Name:   Caisen Mangas. Date of Exam: 10/10/2020 Medical Rec #:  [id number]           Height:       68.0 in Accession #:    [id number]          Weight:       154.0 lb Date of Birth:  1934/04/25            BSA:          1.829 m Patient Age:    40 years            BP:            99/70 mmHg Patient Gender: M                   HR:           90 bpm. Exam Location:  [facility] Procedure: 2D Echo, Cardiac Doppler and Color Doppler Indications:    Dyspnea  History:        Patient has prior history of Echocardiogram examinations, most                 recent 10/25/2014. Arrythmias:Atrial Fibrillation; Risk                 Factors:Hypertension and Diabetes.  Sonographer:    Wenda Low Referring Phys: [place]  1. Left ventricular ejection fraction, by estimation, is 65 to 70%. The left ventricle has normal function. The left ventricle has no regional wall motion abnormalities. There is severe left ventricular hypertrophy. Left ventricular diastolic parameters  are indeterminate in the setting of atrial fibrillation/flutter.  2. Right ventricular systolic function is normal. The right ventricular size is normal. There is normal pulmonary artery systolic pressure. The estimated right ventricular systolic pressure is 24.5 mmHg.  3. Left atrial size was moderately  dilated.  4. The mitral valve is grossly normal. Trivial mitral valve regurgitation.  5. The aortic valve is functionally bicuspid. There is moderate calcification of the aortic valve. Aortic valve regurgitation is trivial. Moderate aortic valve stenosis. Aortic valve mean gradient measures 10.0 mmHg. Dimentionless index 0.44.  6. The inferior vena cava is dilated in size with >50% respiratory variability, suggesting right atrial pressure of 8 mmHg. FINDINGS  Left Ventricle: Left ventricular ejection fraction, by estimation, is 65 to 70%. The left ventricle has normal function. The left ventricle has no regional wall motion abnormalities. The left ventricular internal cavity size was normal in size. There is  severe left ventricular hypertrophy. Left ventricular diastolic parameters are indeterminate. Right Ventricle: The right ventricular size is normal. No increase in right ventricular wall thickness. Right  ventricular systolic function is normal. There is normal pulmonary artery systolic pressure. The tricuspid regurgitant velocity is 2.28 m/s, and  with an assumed right atrial pressure of 8 mmHg, the estimated right ventricular systolic pressure is 31.5 mmHg. Left Atrium: Left atrial size was moderately dilated. Right Atrium: Right atrial size was normal in size. Pericardium: There is no evidence of pericardial effusion. Mitral Valve: The mitral valve is grossly normal. Trivial mitral valve regurgitation. MV peak gradient, 4.4 mmHg. The mean mitral valve gradient is 1.0 mmHg. Tricuspid Valve: The tricuspid valve is grossly normal. Tricuspid valve regurgitation is mild. Aortic Valve: The aortic valve is bicuspid. There is moderate calcification of the aortic valve. There is mild aortic valve annular calcification. Aortic valve regurgitation is trivial. Moderate aortic stenosis is present. Aortic valve mean gradient measures 10.0 mmHg. Aortic valve peak gradient measures 16.5 mmHg. Aortic valve area, by VTI measures 1.39 cm. Pulmonic Valve: The pulmonic valve was grossly normal. Pulmonic valve regurgitation is trivial. Aorta: The aortic root is normal in size and structure. Venous: The inferior vena cava is dilated in size with greater than 50% respiratory variability, suggesting right atrial pressure of 8 mmHg. IAS/Shunts: No atrial level shunt detected by color flow Doppler.  LEFT VENTRICLE PLAX 2D LVIDd:         3.80 cm  Diastology LVIDs:         2.53 cm  LV e' medial:    8.06 cm/s LV PW:         1.58 cm  LV E/e' medial:  11.2 LV IVS:        1.66 cm  LV e' lateral:   15.30 cm/s LVOT diam:     2.00 cm  LV E/e' lateral: 5.9 LV SV:         58 LV SV Index:   32 LVOT Area:     3.14 cm  RIGHT VENTRICLE RV Basal diam:  3.64 cm RV Mid diam:    3.54 cm TAPSE (M-mode): 2.0 cm LEFT ATRIUM              Index       RIGHT ATRIUM           Index LA diam:        4.50 cm  2.46 cm/m  RA Area:     16.50 cm LA Vol (A2C):   103.0 ml  56.32 ml/m RA Volume:   44.70 ml  24.44 ml/m LA Vol (A4C):   82.0 ml  44.84 ml/m LA Biplane Vol: 94.0 ml  51.40 ml/m  AORTIC VALVE AV Area (Vmax):    1.39 cm AV Area (Vmean):   1.22 cm AV Area (VTI):  1.39 cm AV Vmax:           203.00 cm/s AV Vmean:          151.000 cm/s AV VTI:            0.417 m AV Peak Grad:      16.5 mmHg AV Mean Grad:      10.0 mmHg LVOT Vmax:         90.00 cm/s LVOT Vmean:        58.800 cm/s LVOT VTI:          0.184 m LVOT/AV VTI ratio: 0.44  AORTA Ao Root diam: 3.40 cm Ao Asc diam:  3.60 cm MITRAL VALVE               TRICUSPID VALVE MV Area (PHT): 4.65 cm    TR Peak grad:   20.8 mmHg MV Area VTI:   2.78 cm    TR Vmax:        228.00 cm/s MV Peak grad:  4.4 mmHg MV Mean grad:  1.0 mmHg    SHUNTS MV Vmax:       1.05 m/s    Systemic VTI:  0.18 m MV Vmean:      43.3 cm/s   Systemic Diam: 2.00 cm MV Decel Time: 163 msec MV E velocity: 90.20 cm/s Rozann Lesches MD Electronically signed by Rozann Lesches MD Signature Date/Time: 10/10/2020/3:44:51 PM    Final     Radiological Exams on Admission: DG Chest Portable 1 View  Result Date: 10/10/2020 CLINICAL DATA:  86 year old male with weakness and shortness of breath. Tachycardia. EXAM: PORTABLE CHEST 1 VIEW COMPARISON:  Chest radiographs 05/24/2015 and earlier. FINDINGS: Portable AP semi upright view at 0914 hours. Lung volumes and mediastinal contours are stable and within normal limits. Visualized tracheal air column is within normal limits. Chronic blunting of the right lateral costophrenic angle is stable. Allowing for portable technique the lungs are clear. No pneumothorax. No acute osseous abnormality identified. IMPRESSION: Negative portable chest. Electronically Signed   By: Genevie Ann M.D.   On: 10/10/2020 09:27   ECHOCARDIOGRAM COMPLETE  Result Date: 10/10/2020    ECHOCARDIOGRAM REPORT   Patient Name:   Aron Inge. Date of Exam: 10/10/2020 Medical Rec #:  [id number]           Height:       68.0 in Accession #:     [id number]          Weight:       154.0 lb Date of Birth:  1935/02/24            BSA:          1.829 m Patient Age:    71 years            BP:           99/70 mmHg Patient Gender: M                   HR:           90 bpm. Exam Location:  [facility] Procedure: 2D Echo, Cardiac Doppler and Color Doppler Indications:    Dyspnea  History:        Patient has prior history of Echocardiogram examinations, most                 recent 10/25/2014. Arrythmias:Atrial Fibrillation; Risk  Factors:Hypertension and Diabetes.  Sonographer:    Wenda Low Referring Phys: [place]  1. Left ventricular ejection fraction, by estimation, is 65 to 70%. The left ventricle has normal function. The left ventricle has no regional wall motion abnormalities. There is severe left ventricular hypertrophy. Left ventricular diastolic parameters  are indeterminate in the setting of atrial fibrillation/flutter.  2. Right ventricular systolic function is normal. The right ventricular size is normal. There is normal pulmonary artery systolic pressure. The estimated right ventricular systolic pressure is 44.8 mmHg.  3. Left atrial size was moderately dilated.  4. The mitral valve is grossly normal. Trivial mitral valve regurgitation.  5. The aortic valve is functionally bicuspid. There is moderate calcification of the aortic valve. Aortic valve regurgitation is trivial. Moderate aortic valve stenosis. Aortic valve mean gradient measures 10.0 mmHg. Dimentionless index 0.44.  6. The inferior vena cava is dilated in size with >50% respiratory variability, suggesting right atrial pressure of 8 mmHg. FINDINGS  Left Ventricle: Left ventricular ejection fraction, by estimation, is 65 to 70%. The left ventricle has normal function. The left ventricle has no regional wall motion abnormalities. The left ventricular internal cavity size was normal in size. There is  severe left ventricular hypertrophy. Left ventricular  diastolic parameters are indeterminate. Right Ventricle: The right ventricular size is normal. No increase in right ventricular wall thickness. Right ventricular systolic function is normal. There is normal pulmonary artery systolic pressure. The tricuspid regurgitant velocity is 2.28 m/s, and  with an assumed right atrial pressure of 8 mmHg, the estimated right ventricular systolic pressure is 18.5 mmHg. Left Atrium: Left atrial size was moderately dilated. Right Atrium: Right atrial size was normal in size. Pericardium: There is no evidence of pericardial effusion. Mitral Valve: The mitral valve is grossly normal. Trivial mitral valve regurgitation. MV peak gradient, 4.4 mmHg. The mean mitral valve gradient is 1.0 mmHg. Tricuspid Valve: The tricuspid valve is grossly normal. Tricuspid valve regurgitation is mild. Aortic Valve: The aortic valve is bicuspid. There is moderate calcification of the aortic valve. There is mild aortic valve annular calcification. Aortic valve regurgitation is trivial. Moderate aortic stenosis is present. Aortic valve mean gradient measures 10.0 mmHg. Aortic valve peak gradient measures 16.5 mmHg. Aortic valve area, by VTI measures 1.39 cm. Pulmonic Valve: The pulmonic valve was grossly normal. Pulmonic valve regurgitation is trivial. Aorta: The aortic root is normal in size and structure. Venous: The inferior vena cava is dilated in size with greater than 50% respiratory variability, suggesting right atrial pressure of 8 mmHg. IAS/Shunts: No atrial level shunt detected by color flow Doppler.  LEFT VENTRICLE PLAX 2D LVIDd:         3.80 cm  Diastology LVIDs:         2.53 cm  LV e' medial:    8.06 cm/s LV PW:         1.58 cm  LV E/e' medial:  11.2 LV IVS:        1.66 cm  LV e' lateral:   15.30 cm/s LVOT diam:     2.00 cm  LV E/e' lateral: 5.9 LV SV:         58 LV SV Index:   32 LVOT Area:     3.14 cm  RIGHT VENTRICLE RV Basal diam:  3.64 cm RV Mid diam:    3.54 cm TAPSE (M-mode): 2.0 cm  LEFT ATRIUM              Index  RIGHT ATRIUM           Index LA diam:        4.50 cm  2.46 cm/m  RA Area:     16.50 cm LA Vol (A2C):   103.0 ml 56.32 ml/m RA Volume:   44.70 ml  24.44 ml/m LA Vol (A4C):   82.0 ml  44.84 ml/m LA Biplane Vol: 94.0 ml  51.40 ml/m  AORTIC VALVE AV Area (Vmax):    1.39 cm AV Area (Vmean):   1.22 cm AV Area (VTI):     1.39 cm AV Vmax:           203.00 cm/s AV Vmean:          151.000 cm/s AV VTI:            0.417 m AV Peak Grad:      16.5 mmHg AV Mean Grad:      10.0 mmHg LVOT Vmax:         90.00 cm/s LVOT Vmean:        58.800 cm/s LVOT VTI:          0.184 m LVOT/AV VTI ratio: 0.44  AORTA Ao Root diam: 3.40 cm Ao Asc diam:  3.60 cm MITRAL VALVE               TRICUSPID VALVE MV Area (PHT): 4.65 cm    TR Peak grad:   20.8 mmHg MV Area VTI:   2.78 cm    TR Vmax:        228.00 cm/s MV Peak grad:  4.4 mmHg MV Mean grad:  1.0 mmHg    SHUNTS MV Vmax:       1.05 m/s    Systemic VTI:  0.18 m MV Vmean:      43.3 cm/s   Systemic Diam: 2.00 cm MV Decel Time: 163 msec MV E velocity: 90.20 cm/s Rozann Lesches MD Electronically signed by Rozann Lesches MD Signature Date/Time: 10/10/2020/3:44:51 PM    Final     DVT Prophylaxis -SCD /iv heparin AM Labs Ordered, also please review Full Orders  Family Communication: Admission, patients condition and plan of care including tests being ordered have been discussed with the patient * who indicate understanding and agree with the plan   Code Status - Full Code  Likely DC to  home after better rate control  Condition   stable  Roxan Hockey M.D on 10/10/2020 at 6:01 PM Go to [email or web address] -  for contact info  [facility] - Office  [phone]

## 2020-10-10 NOTE — Progress Notes (Signed)
ANTICOAGULATION CONSULT NOTE - Initial Consult  Pharmacy Consult for heparin Indication: atrial fibrillation  Allergies  Allergen Reactions   Iodinated Diagnostic Agents Hives     pt was premedicated/hives reaction occurred 24 hours post injection, Onset Date: [id number]    Sulfa Antibiotics Swelling and Rash   Bactericin [Bacitracin] Other (See Comments)    Unknown reaction     Patient Measurements: Height: 5\' 8"  (172.7 cm) Weight: 69.9 kg (154 lb) IBW/kg (Calculated) : 68.4 Heparin Dosing Weight: 70 kg  Vital Signs: Temp: 98.4 F (36.9 C) (06/30 0930) Temp Source: Oral (06/30 0930) BP: 104/69 (06/30 1034) Pulse Rate: 47 (06/30 1034)  Labs: Recent Labs    10/10/20 0913  HGB 14.3  HCT 42.9  PLT 150  LABPROT 13.1  INR 1.0  CREATININE 1.16  TROPONINIHS 60*    Estimated Creatinine Clearance: 46.7 mL/min (by C-G formula based on SCr of 1.16 mg/dL).   Medical History: Past Medical History:  Diagnosis Date   Arthritis    Auto immune neutropenia (HCC)    Bullous pemphigoid    Cancer (HCC)    Prostate   Cervical spondylosis without myelopathy 01/22/2016   DVT (deep venous thrombosis) ([place])    a. Has had a previous right lower extremity DVT in the setting of hospitalization and surgery (after his brain tumor was resected in 1993) but is no longer on Coumadin.   Dyslipidemia    History of prostate cancer    Hypertension    Meningioma ([place])    a. s/p resection 1990s.   Myasthenia ([place]) 02/14/2013   Nocturnal leg cramps    Ocular myasthenia gravis (HCC)    Peripheral neuropathy 11/23/2018   Seizures (HCC)    Sinus bradycardia    Sleep paralysis    Thyroid nodule, cold     Medications:  (Not in a hospital admission)   Assessment: Pharmacy consulted to dose heparin in patient with atrial fibrillation.  Patient is not on anticoagulation prior to admission.  Goal of Therapy:  Heparin level 0.3-0.7 units/ml Monitor platelets by anticoagulation protocol: Yes    Plan:  Give 3500 units bolus x 1 Start heparin infusion at 1000 units/hr Check anti-Xa level in 8 hours and daily while on heparin Continue to monitor H&H and platelets  Margot Ables, PharmD Clinical Pharmacist 10/10/2020 11:02 AM

## 2020-10-10 NOTE — ED Triage Notes (Signed)
Pt sent by Dr.Fuscos office for tachycardia. Pt states he feel short of breath  and weakness at times. Denies chest pain.

## 2020-10-10 NOTE — Progress Notes (Signed)
ANTICOAGULATION CONSULT NOTE - Follow Up Consult  Pharmacy Consult for heparin Indication: atrial fibrillation  Labs: Recent Labs    10/10/20 0913 10/10/20 1105 10/10/20 1923  HGB 14.3  --   --   HCT 42.9  --   --   PLT 150  --   --   LABPROT 13.1  --   --   INR 1.0  --   --   HEPARINUNFRC  --   --  0.33  CREATININE 1.16  --   --   TROPONINIHS 60* 61*  --     Assessment/Plan:  86yo male therapeutic on heparin with initial dosing for Afib. Will continue gtt at current rate of 1000 units/hr and confirm stable with am labs.   Wynona Neat, PharmD, BCPS  10/10/2020,8:40 PM

## 2020-10-10 NOTE — ED Provider Notes (Signed)
Emergency Department Provider Note   I have reviewed the triage vital signs and the nursing notes.   HISTORY  Chief Complaint Tachycardia   HPI Kyle Gintz. is a 86 y.o. male with past medical history reviewed below presents to the emergency department from his PCP office with tachycardia.  Patient tells me that he has been feeling somewhat fatigued and lower energy which prompted him to make an earlier appointment with his PCP.  He has not had heart palpitations or chest pain.  He does feel mildly winded but mainly describes low energy feeling which is not typical for him.  He does note that he pulled 2 ticks off of his left groin area but this was 3 weeks ago and he has not had fever, rash, myalgias, or other symptoms since that time.  He denies any abdominal pain, vomiting, diarrhea.  His oral intake has been normal.  I spoke with Dr. Gerarda Fraction on the phone prior to the patient's arrival. No radiation of symptoms or modifying factors.    Past Medical History:  Diagnosis Date   Arthritis    Auto immune neutropenia (HCC)    Bullous pemphigoid    Cancer (HCC)    Prostate   Cervical spondylosis without myelopathy 01/22/2016   DVT (deep venous thrombosis) ([place])    a. Has had a previous right lower extremity DVT in the setting of hospitalization and surgery (after his brain tumor was resected in 1993) but is no longer on Coumadin.   Dyslipidemia    History of prostate cancer    Hypertension    Meningioma ([place])    a. s/p resection 1990s.   Myasthenia ([place]) 02/14/2013   Nocturnal leg cramps    Ocular myasthenia gravis ([place])    Peripheral neuropathy 11/23/2018   Seizures ([place])    Sinus bradycardia    Sleep paralysis    Thyroid nodule, cold     Patient Active Problem List   Diagnosis Date Noted   New onset a-fib ([place]) 10/10/2020   Peripheral neuropathy 11/23/2018   Intra-abdominal abscess (HCC)    Abscess of sigmoid colon due to diverticulitis    Acute diverticulitis of  intestine 12/27/2016   Bowel perforation ([place])    Perforation and abscess of large intestine concurrent with and due to diverticulitis 12/15/2016   Blood in stool    Second degree hemorrhoids    Diverticulosis of large intestine without diverticulitis    Essential hypertension, benign 02/03/2016   Cervical spondylosis without myelopathy 01/22/2016   Nocturnal leg cramps 07/24/2015   Nontoxic multinodular goiter 01/31/2015   Elevated transaminase level 10/26/2014   Thrombocytopenia ([place]) 10/25/2014   Myasthenia ([place]) 02/14/2013   Convulsions/seizures ([place]) 02/14/2013    Past Surgical History:  Procedure Laterality Date   BRAIN SURGERY     CHOLECYSTECTOMY     COLONOSCOPY N/A 07/25/2013   Procedure: COLONOSCOPY;  Surgeon: Jamesetta So, MD;  Location: [facility];  Service: Gastroenterology;  Laterality: N/A;   COLONOSCOPY N/A 11/10/2016   Procedure: COLONOSCOPY;  Surgeon: Aviva Signs, MD;  Location: [facility];  Service: Gastroenterology;  Laterality: N/A;   IR CATHETER TUBE CHANGE  01/02/2017   IR RADIOLOGIST EVAL & MGMT  01/06/2017   IR RADIOLOGIST EVAL & MGMT  01/20/2017   IR RADIOLOGIST EVAL & MGMT  02/03/2017   IR RADIOLOGIST EVAL & MGMT  02/17/2017   IR SINUS/FIST TUBE CHK-NON GI  03/03/2017   RADIOACTIVE SEED IMPLANT      Allergies  Iodinated diagnostic agents, Sulfa antibiotics, and Bactericin [bacitracin]  Family History  Problem Relation Age of Onset   Heart failure Mother    Diabetes Mother    Diabetes Sister    Dementia Sister    Lung cancer Son    Heart failure Daughter    Colon cancer Neg Hx     Social History Social History   Tobacco Use   Smoking status: Never   Smokeless tobacco: Never  Substance Use Topics   Alcohol use: No   Drug use: No    Review of Systems  Constitutional: No fever/chills. Positive fatigue.  Eyes: No visual changes. ENT: No sore throat. Cardiovascular: Denies chest pain. Respiratory: Denies shortness of  breath. Gastrointestinal: No abdominal pain.  No nausea, no vomiting.  No diarrhea.  No constipation. Genitourinary: Negative for dysuria. Musculoskeletal: Negative for back pain. Skin: Negative for rash. Neurological: Negative for headaches, focal weakness or numbness.  10-point ROS otherwise negative.  ____________________________________________   PHYSICAL EXAM:  VITAL SIGNS: ED Triage Vitals  Enc Vitals Group     BP 10/10/20 0847 114/77     Pulse Rate 10/10/20 0847 (!) 136     Resp 10/10/20 0847 (!) 24     Temp 10/10/20 0847 98.3 F (36.8 C)     Temp Source 10/10/20 0847 Oral     SpO2 10/10/20 0847 100 %     Weight 10/10/20 0839 154 lb (69.9 kg)     Height 10/10/20 0839 5\' 8"  (1.727 m)   Constitutional: Alert and oriented. Well appearing and in no acute distress. Eyes: Conjunctivae are normal. Head: Atraumatic. Nose: No congestion/rhinnorhea. Mouth/Throat: Mucous membranes are moist.   Neck: No stridor.  Cardiovascular: Tachycardia. Good peripheral circulation. Grossly normal heart sounds.   Respiratory: Normal respiratory effort.  No retractions. Lungs CTAB. Gastrointestinal: Soft and nontender. No distention.  Musculoskeletal: No lower extremity tenderness nor edema. No gross deformities of extremities. Neurologic:  Normal speech and language. No gross focal neurologic deficits are appreciated.  Skin:  Skin is warm, dry and intact. No rash noted.  ____________________________________________   LABS (all labs ordered are listed, but only abnormal results are displayed)  Labs Reviewed  COMPREHENSIVE METABOLIC PANEL - Abnormal; Notable for the following components:      Result Value   Glucose, Bld 141 (*)    BUN 30 (*)    AST 84 (*)    ALT 81 (*)    All other components within normal limits  CBC WITH DIFFERENTIAL/PLATELET - Abnormal; Notable for the following components:   Lymphs Abs 0.5 (*)    All other components within normal limits  TROPONIN I (HIGH  SENSITIVITY) - Abnormal; Notable for the following components:   Troponin I (High Sensitivity) 60 (*)    All other components within normal limits  TROPONIN I (HIGH SENSITIVITY) - Abnormal; Notable for the following components:   Troponin I (High Sensitivity) 61 (*)    All other components within normal limits  RESP PANEL BY RT-PCR (FLU A&B, COVID) ARPGX2  MRSA NEXT GEN BY PCR, NASAL  PROTIME-INR  TSH  HEPARIN LEVEL (UNFRACTIONATED)  MAGNESIUM   ____________________________________________  EKG   EKG Interpretation  Date/Time:  Thursday October 10 2020 08:40:32 EDT Ventricular Rate:  115 PR Interval:    QRS Duration: 87 QT Interval:  382 QTC Calculation: 529 R Axis:   65 Text Interpretation: Atrial flutter Nonspecific repol abnormality, diffuse leads Prolonged QT interval Baseline wander in lead(s) II III aVF Confirmed by Reinhard Schack,  Vonna Kotyk [phone]) on 10/10/2020 9:16:35 AM         ____________________________________________  RADIOLOGY  DG Chest Portable 1 View  Result Date: 10/10/2020 CLINICAL DATA:  86 year old male with weakness and shortness of breath. Tachycardia. EXAM: PORTABLE CHEST 1 VIEW COMPARISON:  Chest radiographs 05/24/2015 and earlier. FINDINGS: Portable AP semi upright view at 0914 hours. Lung volumes and mediastinal contours are stable and within normal limits. Visualized tracheal air column is within normal limits. Chronic blunting of the right lateral costophrenic angle is stable. Allowing for portable technique the lungs are clear. No pneumothorax. No acute osseous abnormality identified. IMPRESSION: Negative portable chest. Electronically Signed   By: Genevie Ann M.D.   On: 10/10/2020 09:27    ____________________________________________   PROCEDURES  Procedure(s) performed:   .Critical Care  Date/Time: 10/10/2020 3:35 PM Performed by: Margette Fast, MD Authorized by: Margette Fast, MD   Critical care provider statement:    Critical care time (minutes):   45   Critical care time was exclusive of:  Separately billable procedures and treating other patients and teaching time   Critical care was necessary to treat or prevent imminent or life-threatening deterioration of the following conditions:  Circulatory failure   Critical care was time spent personally by me on the following activities:  Discussions with consultants, evaluation of patient's response to treatment, examination of patient, ordering and performing treatments and interventions, ordering and review of laboratory studies, ordering and review of radiographic studies, pulse oximetry, re-evaluation of patient's condition, obtaining history from patient or surrogate and review of old charts   I assumed direction of critical care for this patient from another provider in my specialty: no     Care discussed with: admitting provider     ____________________________________________   INITIAL IMPRESSION / [place] / ED COURSE  Pertinent labs & imaging results that were available during my care of the patient were reviewed by me and considered in my medical decision making (see chart for details).   Patient presents to the emergency department from his PCP office with tachycardia.  He has had some fatigue but relatively few other symptoms.  He checked his blood pressure and heart rate yesterday and found it to be elevated.  His EKG here shows what appears to be atrial flutter.  On the monitor at bedside this is more obvious.  Unclear exactly when he developed the symptoms.  He is not hypotensive.  He is not febrile.  I doubt this is related to underlying infection, tickborne illness, developing sepsis. Plan for Diltiazem, IVF, labs, CXR. Without a clear symptom onset timeframe the patient does not appear to be a good cardioversion candidate.   12:30 PM  Labs reviewed.  He has had 2 troponins of around 60.  He continues to be asymptomatic.  He is tolerating the diltiazem infusion well.   He had initial drop in his heart rate but no change in rhythm after the diltiazem IV push but quickly went back into the 130s.  I have noticed that he is mainly staying with heart rates in the 90s on diltiazem but will occasionally have runs up into the 130s.  Not sure that I have him well enough controlled to transition to PO meds. Heparin running as well.   Will admit for further rate control. Cardiology consultation PRN by the medicine team.   Discussed patient's case with [facility] to request admission. Patient and family (if present) updated with plan. Care transferred to [facility] service.  I reviewed all nursing notes, vitals, pertinent old records, EKGs, labs, imaging (as available).  ____________________________________________  FINAL CLINICAL IMPRESSION(S) / ED DIAGNOSES  Final diagnoses:  Typical atrial flutter ([place])    MEDICATIONS GIVEN DURING THIS VISIT:  Medications  diltiazem (CARDIZEM) 125 mg in dextrose 5% 125 mL (1 mg/mL) infusion (5 mg/hr Intravenous Infusion Verify 10/10/20 1449)  heparin ADULT infusion 100 units/mL (25000 units/272mL) (1,000 Units/hr Intravenous Infusion Verify 10/10/20 1449)  Chlorhexidine Gluconate Cloth 2 % PADS 6 each (has no administration in time range)  sodium chloride 0.9 % bolus 500 mL (0 mLs Intravenous Stopped 10/10/20 1129)  diltiazem (CARDIZEM) injection 10 mg (10 mg Intravenous Given 10/10/20 0928)  heparin bolus via infusion 3,500 Units (3,500 Units Intravenous Bolus from Bag 10/10/20 1117)    Note:  This document was prepared using Dragon voice recognition software and may include unintentional dictation errors.  Nanda Quinton, MD, [facility] Emergency Medicine    Harry Bark, [facility], MD 10/10/20 1536

## 2020-10-11 DIAGNOSIS — Z833 Family history of diabetes mellitus: Secondary | ICD-10-CM | POA: Diagnosis not present

## 2020-10-11 DIAGNOSIS — Z8249 Family history of ischemic heart disease and other diseases of the circulatory system: Secondary | ICD-10-CM | POA: Diagnosis not present

## 2020-10-11 DIAGNOSIS — R7989 Other specified abnormal findings of blood chemistry: Secondary | ICD-10-CM | POA: Diagnosis not present

## 2020-10-11 DIAGNOSIS — G8929 Other chronic pain: Secondary | ICD-10-CM | POA: Diagnosis not present

## 2020-10-11 DIAGNOSIS — Z20822 Contact with and (suspected) exposure to covid-19: Secondary | ICD-10-CM | POA: Diagnosis not present

## 2020-10-11 DIAGNOSIS — G40909 Epilepsy, unspecified, not intractable, without status epilepticus: Secondary | ICD-10-CM | POA: Diagnosis not present

## 2020-10-11 DIAGNOSIS — I483 Typical atrial flutter: Secondary | ICD-10-CM | POA: Diagnosis not present

## 2020-10-11 DIAGNOSIS — G629 Polyneuropathy, unspecified: Secondary | ICD-10-CM | POA: Diagnosis not present

## 2020-10-11 DIAGNOSIS — Z79899 Other long term (current) drug therapy: Secondary | ICD-10-CM | POA: Diagnosis not present

## 2020-10-11 DIAGNOSIS — Z881 Allergy status to other antibiotic agents status: Secondary | ICD-10-CM | POA: Diagnosis not present

## 2020-10-11 DIAGNOSIS — Z801 Family history of malignant neoplasm of trachea, bronchus and lung: Secondary | ICD-10-CM | POA: Diagnosis not present

## 2020-10-11 DIAGNOSIS — Z86718 Personal history of other venous thrombosis and embolism: Secondary | ICD-10-CM | POA: Diagnosis not present

## 2020-10-11 DIAGNOSIS — I4891 Unspecified atrial fibrillation: Secondary | ICD-10-CM | POA: Diagnosis not present

## 2020-10-11 DIAGNOSIS — Z888 Allergy status to other drugs, medicaments and biological substances status: Secondary | ICD-10-CM | POA: Diagnosis not present

## 2020-10-11 DIAGNOSIS — I480 Paroxysmal atrial fibrillation: Secondary | ICD-10-CM | POA: Diagnosis not present

## 2020-10-11 DIAGNOSIS — G7000 Myasthenia gravis without (acute) exacerbation: Secondary | ICD-10-CM | POA: Diagnosis not present

## 2020-10-11 DIAGNOSIS — Z882 Allergy status to sulfonamides status: Secondary | ICD-10-CM | POA: Diagnosis not present

## 2020-10-11 DIAGNOSIS — E785 Hyperlipidemia, unspecified: Secondary | ICD-10-CM | POA: Diagnosis not present

## 2020-10-11 DIAGNOSIS — Z8546 Personal history of malignant neoplasm of prostate: Secondary | ICD-10-CM | POA: Diagnosis not present

## 2020-10-11 DIAGNOSIS — I10 Essential (primary) hypertension: Secondary | ICD-10-CM | POA: Diagnosis not present

## 2020-10-11 DIAGNOSIS — Z86011 Personal history of benign neoplasm of the brain: Secondary | ICD-10-CM | POA: Diagnosis not present

## 2020-10-11 DIAGNOSIS — I4892 Unspecified atrial flutter: Secondary | ICD-10-CM | POA: Diagnosis present

## 2020-10-11 DIAGNOSIS — I350 Nonrheumatic aortic (valve) stenosis: Secondary | ICD-10-CM | POA: Diagnosis not present

## 2020-10-11 DIAGNOSIS — M549 Dorsalgia, unspecified: Secondary | ICD-10-CM | POA: Diagnosis present

## 2020-10-11 LAB — CBC
HCT: 39.9 % (ref 39.0–52.0)
Hemoglobin: 13.3 g/dL (ref 13.0–17.0)
MCH: 32 pg (ref 26.0–34.0)
MCHC: 33.3 g/dL (ref 30.0–36.0)
MCV: 95.9 fL (ref 80.0–100.0)
Platelets: 136 10*3/uL — ABNORMAL LOW (ref 150–400)
RBC: 4.16 MIL/uL — ABNORMAL LOW (ref 4.22–5.81)
RDW: 13.2 % (ref 11.5–15.5)
WBC: 3.3 10*3/uL — ABNORMAL LOW (ref 4.0–10.5)
nRBC: 0 % (ref 0.0–0.2)

## 2020-10-11 LAB — BASIC METABOLIC PANEL
Anion gap: 8 (ref 5–15)
BUN: 19 mg/dL (ref 8–23)
CO2: 22 mmol/L (ref 22–32)
Calcium: 8.6 mg/dL — ABNORMAL LOW (ref 8.9–10.3)
Chloride: 103 mmol/L (ref 98–111)
Creatinine, Ser: 0.8 mg/dL (ref 0.61–1.24)
GFR, Estimated: >60 mL/min (ref >60)
Glucose, Bld: 131 mg/dL — ABNORMAL HIGH (ref 70–99)
Potassium: 4 mmol/L (ref 3.5–5.1)
Sodium: 133 mmol/L — ABNORMAL LOW (ref 135–145)

## 2020-10-11 LAB — HEPARIN LEVEL (UNFRACTIONATED): Heparin Unfractionated: 0.3 IU/mL (ref 0.30–0.70)

## 2020-10-11 NOTE — Progress Notes (Signed)
Patient Demographics:    Kyle [name], is a 86 y.o. male, DOB - 18-Oct-1934, TGP:[id number]  Admit date - 10/10/2020   Admitting Physician Kathleen Likins Denton Brick, MD  Outpatient Primary MD for the patient is Redmond School, MD  LOS - 0   Chief Complaint  Patient presents with   Tachycardia        Subjective:    [name] today has no fevers, no emesis,  No chest pain,   --   Assessment  & Plan :    Principal Problem:   New onset a-fib/Flutter  Active Problems:   Aortic stenosis, moderate/Aortic valve mean gradient is 10.0 mmHg   New Onset/Atrial flutter, paroxysmal (HCC)   Ocular Myasthenia (HCC)   Convulsions/seizures (HCC)   Essential hypertension, benign  Brief Summary:- 86 y.o. male with past medical history relevant for nontoxic multinodular goiter, history of meningioma status post prior  resection, ocular myasthenia gravis on Mestinon,History of seizures, on Keppra, no seizures in almost 20 years,  has peripheral neuropathy, takes gabapentin, history of chronic back pain, HTN, history of prostate cancer, prior history of DVT admitted on 10/10/2020 with new onset atrial flutter with RVR   A/p 1)New onset A. fib/a flutter with RVR----rate control remains challenging despite IV Cardizem drip --continue IV Cardizem drip for rate control -Patient was started on IV heparin by EDP continue same for now -TSH WNL, electrolytes WNL Echo with EF of 65 to 70%, severe LVH noted, moderate left atrial enlargement noted, right atrium is not enlarged, moderate aortic stenosis noted  CHA2DS2- VASc score   is = at least 3 based on age and history of HTN    which is  equal to = 3.2 % annual risk of stroke This patients CHA2DS2-VASc Score and unadjusted Ischemic Stroke Rate (% per year) is equal to 3.2 % stroke rate/year from a score of 3     2)HTN--stable, continue losartan, continue IV Cardizem drip for  rate control for A. fib a flutter as above #1   3) ocular myasthenia gravis--- continue Mestinon   4) polyneuropathy----continue gabapentin   5)Sz disorder--- continue Keppra   6) mild LFT elevation--- AST is 84 and  ALT is 81, T bili 0.7 alk phos 62 -Okay to continue simvastatin, monitor LFTs closely   7) moderate aortic stenosis--- noted on echo from 10/10/2020 outpatient follow-up with cardiology for ongoing monitoring advised -Aortic valve mean gradient  measures 10.0 mmHg. Aortic valve peak gradient measures 16.5 mmHg. Aortic  valve area, by VTI measures 1.39 cm.   Disposition/Need for in-Hospital Stay- patient unable to be discharged at this time due to -A. fib with RVR requiring IV Cardizem for rate control and IV heparin for anticoagulation*     Dispo: The patient is from: Home              Anticipated d/c is to: Home              Anticipated d/c date is: 1 day              Patient currently is not medically stable to d/c. Barriers: Not Clinically Stable-  Code Status :  -  Code Status: Full Code   Family Communication:    NA (patient is  alert, awake and coherent)  Discussed with wife at bedside  Consults  :  na  DVT Prophylaxis  : iv heaprin/  - SCDs  SCDs Start: 10/10/20 1754 Place TED hose Start: 10/10/20 1754   Lab Results  Component Value Date   PLT 136 (L) 10/11/2020    Inpatient Medications  Scheduled Meds:  Chlorhexidine Gluconate Cloth  6 each Topical Daily   gabapentin  600 mg Oral Daily   levETIRAcetam  500 mg Oral BID   multivitamin with minerals  1 tablet Oral Daily   omega-3 acid ethyl esters  1 g Oral BID   pyridostigmine  90 mg Oral QID   simvastatin  20 mg Oral QHS   sodium chloride flush  3 mL Intravenous Q12H   sodium chloride flush  3 mL Intravenous Q12H   Continuous Infusions:  sodium chloride     diltiazem (CARDIZEM) infusion 15 mg/hr (10/11/20 1523)   heparin 1,000 Units/hr (10/11/20 1523)   PRN Meds:.sodium chloride,  acetaminophen **OR** acetaminophen, bisacodyl, ondansetron **OR** ondansetron (ZOFRAN) IV, polyethylene glycol, polyvinyl alcohol, sodium chloride flush, tiZANidine, traZODone    Anti-infectives (From admission, onward)    None         Objective:   Vitals:   10/11/20 1500 10/11/20 1600 10/11/20 1623 10/11/20 1700  BP:      Pulse: (!) 54 (!) 44  86  Resp: (!) 25 (!) 26  (!) 22  Temp:   99 F (37.2 C)   TempSrc:   Oral   SpO2: 96% 98%  97%  Weight:      Height:        Wt Readings from Last 3 Encounters:  10/10/20 69.4 kg  08/21/20 70.8 kg  05/15/20 74.6 kg     Intake/Output Summary (Last 24 hours) at 10/11/2020 1804 Last data filed at 10/11/2020 1746 Gross per 24 hour  Intake 686.98 ml  Output --  Net 686.98 ml     Physical Exam  Gen:- Awake Alert,  in no apparent distress  HEENT:- .AT, No sclera icterus Neck-Supple Neck,No JVD,.  Lungs-  CTAB , fair symmetrical air movement CV- S1, S2 normal, irregularly irregular Abd-  +ve B.Sounds, Abd Soft, No tenderness,    Extremity/Skin:- No  edema, pedal pulses present Psych-affect is appropriate, oriented x3 Neuro-no new focal deficits, no tremors   Data Review:   Micro Results Recent Results (from the past 240 hour(s))  Resp Panel by RT-PCR (Flu A&B, Covid) Nasopharyngeal Swab     Status: None   Collection Time: 10/10/20  9:38 AM   Specimen: Nasopharyngeal Swab; Nasopharyngeal(NP) swabs in vial transport medium  Result Value Ref Range Status   SARS Coronavirus 2 by RT PCR NEGATIVE NEGATIVE Final    Comment: (NOTE) SARS-CoV-2 target nucleic acids are NOT DETECTED.  The SARS-CoV-2 RNA is generally detectable in upper respiratory specimens during the acute phase of infection. The lowest concentration of SARS-CoV-2 viral copies this assay can detect is 138 copies/mL. A negative result does not preclude SARS-Cov-2 infection and should not be used as the sole basis for treatment or other patient management  decisions. A negative result may occur with  improper specimen collection/handling, submission of specimen other than nasopharyngeal swab, presence of viral mutation(s) within the areas targeted by this assay, and inadequate number of viral copies(<138 copies/mL). A negative result must be combined with clinical observations, patient history, and epidemiological information. The expected result is Negative.  Fact Sheet for Patients:  EntrepreneurPulse.com.au  Fact Sheet  for Healthcare Providers:  IncredibleEmployment.be  This test is no t yet approved or cleared by the [place] and  has been authorized for detection and/or diagnosis of SARS-CoV-2 by FDA under an Emergency Use Authorization (EUA). This EUA will remain  in effect (meaning this test can be used) for the duration of the COVID-19 declaration under Section 564(b)(1) of the Act, 21 U.S.C.section 360bbb-3(b)(1), unless the authorization is terminated  or revoked sooner.       Influenza A by PCR NEGATIVE NEGATIVE Final   Influenza B by PCR NEGATIVE NEGATIVE Final    Comment: (NOTE) The Xpert Xpress SARS-CoV-2/FLU/RSV plus assay is intended as an aid in the diagnosis of influenza from Nasopharyngeal swab specimens and should not be used as a sole basis for treatment. Nasal washings and aspirates are unacceptable for Xpert Xpress SARS-CoV-2/FLU/RSV testing.  Fact Sheet for Patients: EntrepreneurPulse.com.au  Fact Sheet for Healthcare Providers: IncredibleEmployment.be  This test is not yet approved or cleared by the [place] FDA and has been authorized for detection and/or diagnosis of SARS-CoV-2 by FDA under an Emergency Use Authorization (EUA). This EUA will remain in effect (meaning this test can be used) for the duration of the COVID-19 declaration under Section 564(b)(1) of the Act, 21 U.S.C. section 360bbb-3(b)(1), unless the  authorization is terminated or revoked.  Performed at [facility], [address], [place], [place] [address]   MRSA Next Gen by PCR, Nasal     Status: None   Collection Time: 10/10/20  3:14 PM   Specimen: Nasal Mucosa; Nasal Swab  Result Value Ref Range Status   MRSA by PCR Next Gen NOT DETECTED NOT DETECTED Final    Comment: (NOTE) The GeneXpert MRSA Assay (FDA approved for NASAL specimens only), is one component of a comprehensive MRSA colonization surveillance program. It is not intended to diagnose MRSA infection nor to guide or monitor treatment for MRSA infections. Test performance is not FDA approved in patients less than 91 years old. Performed at [facility], [address], [place], [place] [address]     Radiology Reports DG Chest Portable 1 View  Result Date: 10/10/2020 CLINICAL DATA:  86 year old male with weakness and shortness of breath. Tachycardia. EXAM: PORTABLE CHEST 1 VIEW COMPARISON:  Chest radiographs 05/24/2015 and earlier. FINDINGS: Portable AP semi upright view at 0914 hours. Lung volumes and mediastinal contours are stable and within normal limits. Visualized tracheal air column is within normal limits. Chronic blunting of the right lateral costophrenic angle is stable. Allowing for portable technique the lungs are clear. No pneumothorax. No acute osseous abnormality identified. IMPRESSION: Negative portable chest. Electronically Signed   By: Genevie Ann M.D.   On: 10/10/2020 09:27   ECHOCARDIOGRAM COMPLETE  Result Date: 10/10/2020    ECHOCARDIOGRAM REPORT   Patient Name:   Kyle [name]. Date of Exam: 10/10/2020 Medical Rec #:  [id number]           Height:       68.0 in Accession #:    [id number]          Weight:       154.0 lb Date of Birth:  07-Apr-1935            BSA:          1.829 m Patient Age:    74 years            BP:           99/70 mmHg Patient Gender: M  HR:           90 bpm. Exam Location:  [facility] Procedure: 2D Echo, Cardiac Doppler  and Color Doppler Indications:    Dyspnea  History:        Patient has prior history of Echocardiogram examinations, most                 recent 10/25/2014. Arrythmias:Atrial Fibrillation; Risk                 Factors:Hypertension and Diabetes.  Sonographer:    Wenda Low Referring Phys: [place]  1. Left ventricular ejection fraction, by estimation, is 65 to 70%. The left ventricle has normal function. The left ventricle has no regional wall motion abnormalities. There is severe left ventricular hypertrophy. Left ventricular diastolic parameters  are indeterminate in the setting of atrial fibrillation/flutter.  2. Right ventricular systolic function is normal. The right ventricular size is normal. There is normal pulmonary artery systolic pressure. The estimated right ventricular systolic pressure is 69.4 mmHg.  3. Left atrial size was moderately dilated.  4. The mitral valve is grossly normal. Trivial mitral valve regurgitation.  5. The aortic valve is functionally bicuspid. There is moderate calcification of the aortic valve. Aortic valve regurgitation is trivial. Moderate aortic valve stenosis. Aortic valve mean gradient measures 10.0 mmHg. Dimentionless index 0.44.  6. The inferior vena cava is dilated in size with >50% respiratory variability, suggesting right atrial pressure of 8 mmHg. FINDINGS  Left Ventricle: Left ventricular ejection fraction, by estimation, is 65 to 70%. The left ventricle has normal function. The left ventricle has no regional wall motion abnormalities. The left ventricular internal cavity size was normal in size. There is  severe left ventricular hypertrophy. Left ventricular diastolic parameters are indeterminate. Right Ventricle: The right ventricular size is normal. No increase in right ventricular wall thickness. Right ventricular systolic function is normal. There is normal pulmonary artery systolic pressure. The tricuspid regurgitant velocity is 2.28  m/s, and  with an assumed right atrial pressure of 8 mmHg, the estimated right ventricular systolic pressure is 85.4 mmHg. Left Atrium: Left atrial size was moderately dilated. Right Atrium: Right atrial size was normal in size. Pericardium: There is no evidence of pericardial effusion. Mitral Valve: The mitral valve is grossly normal. Trivial mitral valve regurgitation. MV peak gradient, 4.4 mmHg. The mean mitral valve gradient is 1.0 mmHg. Tricuspid Valve: The tricuspid valve is grossly normal. Tricuspid valve regurgitation is mild. Aortic Valve: The aortic valve is bicuspid. There is moderate calcification of the aortic valve. There is mild aortic valve annular calcification. Aortic valve regurgitation is trivial. Moderate aortic stenosis is present. Aortic valve mean gradient measures 10.0 mmHg. Aortic valve peak gradient measures 16.5 mmHg. Aortic valve area, by VTI measures 1.39 cm. Pulmonic Valve: The pulmonic valve was grossly normal. Pulmonic valve regurgitation is trivial. Aorta: The aortic root is normal in size and structure. Venous: The inferior vena cava is dilated in size with greater than 50% respiratory variability, suggesting right atrial pressure of 8 mmHg. IAS/Shunts: No atrial level shunt detected by color flow Doppler.  LEFT VENTRICLE PLAX 2D LVIDd:         3.80 cm  Diastology LVIDs:         2.53 cm  LV e' medial:    8.06 cm/s LV PW:         1.58 cm  LV E/e' medial:  11.2 LV IVS:        1.66  cm  LV e' lateral:   15.30 cm/s LVOT diam:     2.00 cm  LV E/e' lateral: 5.9 LV SV:         58 LV SV Index:   32 LVOT Area:     3.14 cm  RIGHT VENTRICLE RV Basal diam:  3.64 cm RV Mid diam:    3.54 cm TAPSE (M-mode): 2.0 cm LEFT ATRIUM              Index       RIGHT ATRIUM           Index LA diam:        4.50 cm  2.46 cm/m  RA Area:     16.50 cm LA Vol (A2C):   103.0 ml 56.32 ml/m RA Volume:   44.70 ml  24.44 ml/m LA Vol (A4C):   82.0 ml  44.84 ml/m LA Biplane Vol: 94.0 ml  51.40 ml/m  AORTIC VALVE  AV Area (Vmax):    1.39 cm AV Area (Vmean):   1.22 cm AV Area (VTI):     1.39 cm AV Vmax:           203.00 cm/s AV Vmean:          151.000 cm/s AV VTI:            0.417 m AV Peak Grad:      16.5 mmHg AV Mean Grad:      10.0 mmHg LVOT Vmax:         90.00 cm/s LVOT Vmean:        58.800 cm/s LVOT VTI:          0.184 m LVOT/AV VTI ratio: 0.44  AORTA Ao Root diam: 3.40 cm Ao Asc diam:  3.60 cm MITRAL VALVE               TRICUSPID VALVE MV Area (PHT): 4.65 cm    TR Peak grad:   20.8 mmHg MV Area VTI:   2.78 cm    TR Vmax:        228.00 cm/s MV Peak grad:  4.4 mmHg MV Mean grad:  1.0 mmHg    SHUNTS MV Vmax:       1.05 m/s    Systemic VTI:  0.18 m MV Vmean:      43.3 cm/s   Systemic Diam: 2.00 cm MV Decel Time: 163 msec MV E velocity: 90.20 cm/s Rozann Lesches MD Electronically signed by Rozann Lesches MD Signature Date/Time: 10/10/2020/3:44:51 PM    Final      CBC Recent Labs  Lab 10/10/20 0913 10/11/20 0411  WBC 5.1 3.3*  HGB 14.3 13.3  HCT 42.9 39.9  PLT 150 136*  MCV 95.5 95.9  MCH 31.8 32.0  MCHC 33.3 33.3  RDW 13.2 13.2  LYMPHSABS 0.5*  --   MONOABS 0.4  --   EOSABS 0.0  --   BASOSABS 0.0  --     Chemistries  Recent Labs  Lab 10/10/20 0913 10/10/20 1105 10/11/20 0411  NA 138  --  133*  K 4.0  --  4.0  CL 103  --  103  CO2 24  --  22  GLUCOSE 141*  --  131*  BUN 30*  --  19  CREATININE 1.16  --  0.80  CALCIUM 9.8  --  8.6*  MG  --  1.8  --   AST 84*  --   --   ALT 81*  --   --  ALKPHOS 62  --   --   BILITOT 0.7  --   --    ------------------------------------------------------------------------------------------------------------------ No results for input(s): CHOL, HDL, LDLCALC, TRIG, CHOLHDL, LDLDIRECT in the last 72 hours.  Lab Results  Component Value Date   HGBA1C 5.9 (H) 10/25/2014   ------------------------------------------------------------------------------------------------------------------ Recent Labs    10/10/20 0914  TSH 2.438    ------------------------------------------------------------------------------------------------------------------ No results for input(s): VITAMINB12, FOLATE, FERRITIN, TIBC, IRON, RETICCTPCT in the last 72 hours.  Coagulation profile Recent Labs  Lab 10/10/20 0913  INR 1.0    No results for input(s): DDIMER in the last 72 hours.  Cardiac Enzymes No results for input(s): CKMB, TROPONINI, MYOGLOBIN in the last 168 hours.  Invalid input(s): CK ------------------------------------------------------------------------------------------------------------------    Component Value Date/Time   BNP 90.0 10/25/2014 0457     Roxan Hockey M.D on 10/11/2020 at 6:04 PM  Go to [email or web address] - for contact info  [facility] - Office  [phone]

## 2020-10-11 NOTE — Progress Notes (Signed)
ANTICOAGULATION CONSULT NOTE -   Pharmacy Consult for heparin Indication: atrial fibrillation  Allergies  Allergen Reactions   Iodinated Diagnostic Agents Hives     pt was premedicated/hives reaction occurred 24 hours post injection, Onset Date: 93818299    Sulfa Antibiotics Swelling and Rash   Bactericin [Bacitracin] Other (See Comments)    Unknown reaction     Patient Measurements: Height: 5\' 8"  (172.7 cm) Weight: 69.4 kg (153 lb) IBW/kg (Calculated) : 68.4 Heparin Dosing Weight: 70 kg  Vital Signs: Temp: 98.5 F (36.9 C) (07/01 0728) Temp Source: Oral (07/01 0728) BP: 130/70 (07/01 0500) Pulse Rate: 71 (07/01 0728)  Labs: Recent Labs    10/10/20 0913 10/10/20 1105 10/10/20 1923 10/11/20 0411  HGB 14.3  --   --  13.3  HCT 42.9  --   --  39.9  PLT 150  --   --  136*  LABPROT 13.1  --   --   --   INR 1.0  --   --   --   HEPARINUNFRC  --   --  0.33 0.30  CREATININE 1.16  --   --  0.80  TROPONINIHS 60* 61*  --   --      Estimated Creatinine Clearance: 67.7 mL/min (by C-G formula based on SCr of 0.8 mg/dL).   Medical History: Past Medical History:  Diagnosis Date   Arthritis    Auto immune neutropenia (HCC)    Bullous pemphigoid    Cancer (HCC)    Prostate   Cervical spondylosis without myelopathy 01/22/2016   DVT (deep venous thrombosis) ([place])    a. Has had a previous right lower extremity DVT in the setting of hospitalization and surgery (after his brain tumor was resected in 1993) but is no longer on Coumadin.   Dyslipidemia    History of prostate cancer    Hypertension    Meningioma ([place])    a. s/p resection 1990s.   Myasthenia ([place]) 02/14/2013   Nocturnal leg cramps    Ocular myasthenia gravis (HCC)    Peripheral neuropathy 11/23/2018   Seizures (HCC)    Sinus bradycardia    Sleep paralysis    Thyroid nodule, cold     Medications:  Medications Prior to Admission  Medication Sig Dispense Refill Last Dose   fish oil-omega-3 fatty acids 1000  MG capsule Take 1 g by mouth 3 (three) times daily.   10/10/2020   gabapentin (NEURONTIN) 600 MG tablet TAKE 1 TABLET BY MOUTH  DAILY (Patient taking differently: Take 600 mg by mouth daily.) 90 tablet 3 3/71/6967   Garlic (GARLIQUE PO) Take 1 tablet by mouth daily.   10/10/2020   levETIRAcetam (KEPPRA) 500 MG tablet Take 1/2 tablet in the morning, take 1 tablet in the evening (Patient taking differently: Take 250-500 mg by mouth 2 (two) times daily. Take 1/2 tablet in the morning, take 1 tablet in the evening) 135 tablet 3 10/10/2020   losartan (COZAAR) 25 MG tablet Take 25 mg by mouth in the morning and at bedtime.   10/10/2020   MAGNESIUM CITRATE PO Take 1 tablet by mouth daily.   10/10/2020   niacinamide 500 MG tablet Take 500 mg by mouth 3 (three) times daily.   10/10/2020   Polyethyl Glycol-Propyl Glycol 0.4-0.3 % SOLN Place 1 drop into both eyes 3 (three) times daily as needed (for dry eyes).   PRN   pyridostigmine (MESTINON) 60 MG tablet TAKE 1 AND 1/2 TABLETS BY  MOUTH 4 TIMES DAILY (  Patient taking differently: Take 90 mg by mouth 4 (four) times daily.) 540 tablet 3 10/10/2020   simvastatin (ZOCOR) 20 MG tablet Take 20 mg by mouth at bedtime.    10/09/2020   tiZANidine (ZANAFLEX) 4 MG tablet Take 4 mg by mouth at bedtime as needed for muscle spasms.   Past Week   diclofenac (VOLTAREN) 75 MG EC tablet Take 1 tablet (75 mg total) by mouth 2 (two) times daily. (Patient not taking: Reported on 10/10/2020) 180 tablet 3 Not Taking    Assessment: Pharmacy consulted to dose heparin in patient with atrial fibrillation.  Patient is not on anticoagulation prior to admission.  HL 0.3- therapeutic  Goal of Therapy:  Heparin level 0.3-0.7 units/ml Monitor platelets by anticoagulation protocol: Yes   Plan:  Continue heparin infusion at 1000 units/hr Check anti-Xa level daily while on heparin  Continue to monitor H&H and platelets.   Margot Ables, PharmD Clinical Pharmacist 10/11/2020 7:43 AM

## 2020-10-12 LAB — COMPREHENSIVE METABOLIC PANEL
ALT: 76 U/L — ABNORMAL HIGH (ref 0–44)
AST: 79 U/L — ABNORMAL HIGH (ref 15–41)
Albumin: 3.3 g/dL — ABNORMAL LOW (ref 3.5–5.0)
Alkaline Phosphatase: 56 U/L (ref 38–126)
Anion gap: 9 (ref 5–15)
BUN: 15 mg/dL (ref 8–23)
CO2: 23 mmol/L (ref 22–32)
Calcium: 8.8 mg/dL — ABNORMAL LOW (ref 8.9–10.3)
Chloride: 99 mmol/L (ref 98–111)
Creatinine, Ser: 0.85 mg/dL (ref 0.61–1.24)
GFR, Estimated: >60 mL/min (ref >60)
Glucose, Bld: 123 mg/dL — ABNORMAL HIGH (ref 70–99)
Potassium: 4 mmol/L (ref 3.5–5.1)
Sodium: 131 mmol/L — ABNORMAL LOW (ref 135–145)
Total Bilirubin: 0.9 mg/dL (ref 0.3–1.2)
Total Protein: 6.4 g/dL — ABNORMAL LOW (ref 6.5–8.1)

## 2020-10-12 LAB — HEPARIN LEVEL (UNFRACTIONATED)
Heparin Unfractionated: 0.25 IU/mL — ABNORMAL LOW (ref 0.30–0.70)
Heparin Unfractionated: 0.49 IU/mL (ref 0.30–0.70)

## 2020-10-12 LAB — CBC
HCT: 42 % (ref 39.0–52.0)
Hemoglobin: 14 g/dL (ref 13.0–17.0)
MCH: 31.8 pg (ref 26.0–34.0)
MCHC: 33.3 g/dL (ref 30.0–36.0)
MCV: 95.5 fL (ref 80.0–100.0)
Platelets: 111 10*3/uL — ABNORMAL LOW (ref 150–400)
RBC: 4.4 MIL/uL (ref 4.22–5.81)
RDW: 13.5 % (ref 11.5–15.5)
WBC: 3.7 10*3/uL — ABNORMAL LOW (ref 4.0–10.5)
nRBC: 0 % (ref 0.0–0.2)

## 2020-10-12 MED ORDER — LABETALOL HCL 5 MG/ML IV SOLN: 10.0000 mg | INTRAVENOUS | Status: DC | PRN

## 2020-10-12 MED ORDER — LABETALOL HCL 5 MG/ML IV SOLN
INTRAVENOUS | Status: AC
  Filled 2020-10-12: qty 4

## 2020-10-12 MED ORDER — METOPROLOL TARTRATE 25 MG PO TABS
25.0000 mg | ORAL_TABLET | Freq: Two times a day (BID) | ORAL | Status: DC
  Administered 2020-10-12 (×2): 25 mg via ORAL
  Filled 2020-10-12 (×2): qty 1

## 2020-10-12 NOTE — Progress Notes (Signed)
ANTICOAGULATION CONSULT NOTE -   Pharmacy Consult for heparin Indication: atrial fibrillation  Allergies  Allergen Reactions   Iodinated Diagnostic Agents Hives     pt was premedicated/hives reaction occurred 24 hours post injection, Onset Date: [id number]    Sulfa Antibiotics Swelling and Rash   Bactericin [Bacitracin] Other (See Comments)    Unknown reaction     Patient Measurements: Height: 5\' 8"  (172.7 cm) Weight: 69.4 kg (153 lb) IBW/kg (Calculated) : 68.4 Heparin Dosing Weight: 70 kg  Vital Signs: Temp: 98.9 F (37.2 C) (07/02 1554) Temp Source: Oral (07/02 1554) BP: 121/85 (07/02 1600) Pulse Rate: 72 (07/02 0900)  Labs: Recent Labs    10/10/20 0913 10/10/20 1105 10/10/20 1923 10/11/20 0411 10/12/20 0446 10/12/20 1626  HGB 14.3  --   --  13.3 14.0  --   HCT 42.9  --   --  39.9 42.0  --   PLT 150  --   --  136* 111*  --   LABPROT 13.1  --   --   --   --   --   INR 1.0  --   --   --   --   --   HEPARINUNFRC  --   --    < > 0.30 0.25* 0.49  CREATININE 1.16  --   --  0.80 0.85  --   TROPONINIHS 60* 61*  --   --   --   --    < > = values in this interval not displayed.     Estimated Creatinine Clearance: 63.7 mL/min (by C-G formula based on SCr of 0.85 mg/dL).   Medical History: Past Medical History:  Diagnosis Date   Arthritis    Auto immune neutropenia (HCC)    Bullous pemphigoid    Cancer (HCC)    Prostate   Cervical spondylosis without myelopathy 01/22/2016   DVT (deep venous thrombosis) ([place])    a. Has had a previous right lower extremity DVT in the setting of hospitalization and surgery (after his brain tumor was resected in 1993) but is no longer on Coumadin.   Dyslipidemia    History of prostate cancer    Hypertension    Meningioma ([place])    a. s/p resection 1990s.   Myasthenia ([place]) 02/14/2013   Nocturnal leg cramps    Ocular myasthenia gravis (HCC)    Peripheral neuropathy 11/23/2018   Seizures (HCC)    Sinus bradycardia    Sleep  paralysis    Thyroid nodule, cold     Medications:  Medications Prior to Admission  Medication Sig Dispense Refill Last Dose   fish oil-omega-3 fatty acids 1000 MG capsule Take 1 g by mouth 3 (three) times daily.   10/10/2020   gabapentin (NEURONTIN) 600 MG tablet TAKE 1 TABLET BY MOUTH  DAILY (Patient taking differently: Take 600 mg by mouth daily.) 90 tablet 3 6/38/7564   Garlic (GARLIQUE PO) Take 1 tablet by mouth daily.   10/10/2020   levETIRAcetam (KEPPRA) 500 MG tablet Take 1/2 tablet in the morning, take 1 tablet in the evening (Patient taking differently: Take 250-500 mg by mouth 2 (two) times daily. Take 1/2 tablet in the morning, take 1 tablet in the evening) 135 tablet 3 10/10/2020   losartan (COZAAR) 25 MG tablet Take 25 mg by mouth in the morning and at bedtime.   10/10/2020   MAGNESIUM CITRATE PO Take 1 tablet by mouth daily.   10/10/2020   niacinamide 500 MG tablet Take 500  mg by mouth 3 (three) times daily.   10/10/2020   Polyethyl Glycol-Propyl Glycol 0.4-0.3 % SOLN Place 1 drop into both eyes 3 (three) times daily as needed (for dry eyes).   PRN   pyridostigmine (MESTINON) 60 MG tablet TAKE 1 AND 1/2 TABLETS BY  MOUTH 4 TIMES DAILY (Patient taking differently: Take 90 mg by mouth 4 (four) times daily.) 540 tablet 3 10/10/2020   simvastatin (ZOCOR) 20 MG tablet Take 20 mg by mouth at bedtime.    10/09/2020   tiZANidine (ZANAFLEX) 4 MG tablet Take 4 mg by mouth at bedtime as needed for muscle spasms.   Past Week   diclofenac (VOLTAREN) 75 MG EC tablet Take 1 tablet (75 mg total) by mouth 2 (two) times daily. (Patient not taking: Reported on 10/10/2020) 180 tablet 3 Not Taking    Assessment: Pharmacy consulted to dose heparin in patient with atrial fibrillation.  Patient is not on anticoagulation prior to admission.  HL 0.49 - therapeutic  Goal of Therapy:  Heparin level 0.3-0.7 units/ml Monitor platelets by anticoagulation protocol: Yes   Plan:  Continue heparin infusion at 1150  units/hr Check anti-Xa level daily while on heparin  Continue to monitor H&H and platelets.   Thomasenia Sales, PharmD, MBA, BCGP Clinical Pharmacist  10/12/2020 5:11 PM

## 2020-10-12 NOTE — Progress Notes (Signed)
ANTICOAGULATION CONSULT NOTE -   Pharmacy Consult for heparin Indication: atrial fibrillation  Allergies  Allergen Reactions   Iodinated Diagnostic Agents Hives     pt was premedicated/hives reaction occurred 24 hours post injection, Onset Date: [id number]    Sulfa Antibiotics Swelling and Rash   Bactericin [Bacitracin] Other (See Comments)    Unknown reaction     Patient Measurements: Height: 5\' 8"  (172.7 cm) Weight: 69.4 kg (153 lb) IBW/kg (Calculated) : 68.4 Heparin Dosing Weight: 70 kg  Vital Signs: Temp: 97.7 F (36.5 C) (07/02 0740) Temp Source: Oral (07/02 0740) BP: 110/57 (07/02 1000) Pulse Rate: 72 (07/02 0900)  Labs: Recent Labs    10/10/20 0913 10/10/20 1105 10/10/20 1923 10/11/20 0411 10/12/20 0446  HGB 14.3  --   --  13.3 14.0  HCT 42.9  --   --  39.9 42.0  PLT 150  --   --  136* 111*  LABPROT 13.1  --   --   --   --   INR 1.0  --   --   --   --   HEPARINUNFRC  --   --  0.33 0.30 0.25*  CREATININE 1.16  --   --  0.80 0.85  TROPONINIHS 60* 61*  --   --   --      Estimated Creatinine Clearance: 63.7 mL/min (by C-G formula based on SCr of 0.85 mg/dL).   Medical History: Past Medical History:  Diagnosis Date   Arthritis    Auto immune neutropenia (HCC)    Bullous pemphigoid    Cancer (HCC)    Prostate   Cervical spondylosis without myelopathy 01/22/2016   DVT (deep venous thrombosis) ([place])    a. Has had a previous right lower extremity DVT in the setting of hospitalization and surgery (after his brain tumor was resected in 1993) but is no longer on Coumadin.   Dyslipidemia    History of prostate cancer    Hypertension    Meningioma ([place])    a. s/p resection 1990s.   Myasthenia ([place]) 02/14/2013   Nocturnal leg cramps    Ocular myasthenia gravis (HCC)    Peripheral neuropathy 11/23/2018   Seizures (HCC)    Sinus bradycardia    Sleep paralysis    Thyroid nodule, cold     Medications:  Medications Prior to Admission  Medication Sig  Dispense Refill Last Dose   fish oil-omega-3 fatty acids 1000 MG capsule Take 1 g by mouth 3 (three) times daily.   10/10/2020   gabapentin (NEURONTIN) 600 MG tablet TAKE 1 TABLET BY MOUTH  DAILY (Patient taking differently: Take 600 mg by mouth daily.) 90 tablet 3 09/12/930   Garlic (GARLIQUE PO) Take 1 tablet by mouth daily.   10/10/2020   levETIRAcetam (KEPPRA) 500 MG tablet Take 1/2 tablet in the morning, take 1 tablet in the evening (Patient taking differently: Take 250-500 mg by mouth 2 (two) times daily. Take 1/2 tablet in the morning, take 1 tablet in the evening) 135 tablet 3 10/10/2020   losartan (COZAAR) 25 MG tablet Take 25 mg by mouth in the morning and at bedtime.   10/10/2020   MAGNESIUM CITRATE PO Take 1 tablet by mouth daily.   10/10/2020   niacinamide 500 MG tablet Take 500 mg by mouth 3 (three) times daily.   10/10/2020   Polyethyl Glycol-Propyl Glycol 0.4-0.3 % SOLN Place 1 drop into both eyes 3 (three) times daily as needed (for dry eyes).   PRN  pyridostigmine (MESTINON) 60 MG tablet TAKE 1 AND 1/2 TABLETS BY  MOUTH 4 TIMES DAILY (Patient taking differently: Take 90 mg by mouth 4 (four) times daily.) 540 tablet 3 10/10/2020   simvastatin (ZOCOR) 20 MG tablet Take 20 mg by mouth at bedtime.    10/09/2020   tiZANidine (ZANAFLEX) 4 MG tablet Take 4 mg by mouth at bedtime as needed for muscle spasms.   Past Week   diclofenac (VOLTAREN) 75 MG EC tablet Take 1 tablet (75 mg total) by mouth 2 (two) times daily. (Patient not taking: Reported on 10/10/2020) 180 tablet 3 Not Taking    Assessment: Pharmacy consulted to dose heparin in patient with atrial fibrillation.  Patient is not on anticoagulation prior to admission.  HL 0.25- subtherapeutic  Goal of Therapy:  Heparin level 0.3-0.7 units/ml Monitor platelets by anticoagulation protocol: Yes   Plan:  Increase heparin infusion to 1150 units/hr Check anti-Xa level daily while on heparin  Continue to monitor H&H and  platelets.   Thomasenia Sales, PharmD, MBA, BCGP Clinical Pharmacist  10/12/2020 10:16 AM

## 2020-10-12 NOTE — Progress Notes (Signed)
Patient Demographics:    Kyle [name], is a 86 y.o. male, DOB - [birth year], BZX:[id number]  Admit date - 10/10/2020   Admitting Physician Kyle Dewald Denton Brick, MD  Outpatient Primary MD for the patient is Kyle School, MD  LOS - 1   Chief Complaint  Patient presents with   Tachycardia        Subjective:    Kyle [name] today has no fevers, no emesis,  No chest pain,   -- Wife at bedside, rate control remains challenging  Assessment  & Plan :    Principal Problem:   New onset a-fib/Flutter  Active Problems:   Aortic stenosis, moderate/Aortic valve mean gradient is 10.0 mmHg   New Onset/Atrial flutter, paroxysmal (HCC)   Ocular Myasthenia (HCC)   Convulsions/seizures (HCC)   Essential hypertension, benign  Brief Summary:- 86 y.o. male with past medical history relevant for nontoxic multinodular goiter, history of meningioma status post prior  resection, ocular myasthenia gravis on Mestinon,History of seizures, on Keppra, no seizures in almost 20 years,  has peripheral neuropathy, takes gabapentin, history of chronic back pain, HTN, history of prostate cancer, prior history of DVT admitted on 10/10/2020 with new onset atrial flutter with RVR   A/p 1)New onset A. fib/a flutter with RVR----rate control remains challenging despite IV Cardizem drip --continue IV Cardizem drip for rate control Added p.o. metoprolol on 10/12/2020, soft BP is limiting ability to titrate up metoprolol -Patient was started on IV heparin by EDP continue same for now -TSH WNL, electrolytes WNL Echo with EF of 65 to 70%, severe LVH noted, moderate left atrial enlargement noted, right atrium is not enlarged, moderate aortic stenosis noted  CHA2DS2- VASc score   is = at least 3 based on age and history of HTN    which is  equal to = 3.2 % annual risk of stroke This patients CHA2DS2-VASc Score and unadjusted Ischemic Stroke Rate (%  per year) is equal to 3.2 % stroke rate/year from a score of 3     2)HTN--stable, continue losartan, continue IV Cardizem drip for rate control for A. fib a flutter as above #1   3) ocular myasthenia gravis--- continue Mestinon   4) polyneuropathy----continue gabapentin   5)Sz disorder--- continue Keppra   6) mild LFT elevation--- AST is 84 >>79 and  ALT is 81 >76, T bili 0.7 - alk phos 62>>56 -Okay to continue simvastatin, monitor LFTs closely   7) moderate aortic stenosis--- noted on echo from 10/10/2020 outpatient follow-up with cardiology for ongoing monitoring advised -Aortic valve mean gradient  measures 10.0 mmHg. Aortic valve peak gradient measures 16.5 mmHg. Aortic  valve area, by VTI measures 1.39 cm.   Disposition/Need for in-Hospital Stay- patient unable to be discharged at this time due to -A. fib with RVR requiring IV Cardizem for rate control and IV heparin for anticoagulation*     Dispo: The patient is from: Home              Anticipated d/c is to: Home              Anticipated d/c date is: 1 day              Patient currently is not medically stable to d/c. Barriers: Not  Clinically Stable-  Code Status :  -  Code Status: Full Code   Family Communication:    NA (patient is alert, awake and coherent)  Discussed with wife at bedside  Consults  :  na  DVT Prophylaxis  : iv heaprin/  - SCDs  SCDs Start: 10/10/20 1754 Place TED hose Start: 10/10/20 1754   Lab Results  Component Value Date   PLT 111 (L) 10/12/2020    Inpatient Medications  Scheduled Meds:  Chlorhexidine Gluconate Cloth  6 each Topical Daily   gabapentin  600 mg Oral Daily   levETIRAcetam  500 mg Oral BID   metoprolol tartrate  25 mg Oral BID   multivitamin with minerals  1 tablet Oral Daily   omega-3 acid ethyl esters  1 g Oral BID   pyridostigmine  90 mg Oral QID   simvastatin  20 mg Oral QHS   sodium chloride flush  3 mL Intravenous Q12H   sodium chloride flush  3 mL Intravenous  Q12H   Continuous Infusions:  sodium chloride     diltiazem (CARDIZEM) infusion Stopped (10/12/20 0100)   heparin 1,150 Units/hr (10/12/20 1016)   PRN Meds:.sodium chloride, acetaminophen **OR** acetaminophen, bisacodyl, labetalol, ondansetron **OR** ondansetron (ZOFRAN) IV, polyethylene glycol, polyvinyl alcohol, sodium chloride flush, tiZANidine, traZODone    Anti-infectives (From admission, onward)    None         Objective:   Vitals:   10/12/20 1400 10/12/20 1500 10/12/20 1554 10/12/20 1600  BP: (!) 98/45 103/63  121/85  Pulse:      Resp: (!) 23 (!) 22 18 (!) 21  Temp:   98.9 F (37.2 C)   TempSrc:   Oral   SpO2:      Weight:      Height:        Wt Readings from Last 3 Encounters:  10/12/20 69.4 kg  08/21/20 70.8 kg  05/15/20 74.6 kg     Intake/Output Summary (Last 24 hours) at 10/12/2020 1826 Last data filed at 10/12/2020 1800 Gross per 24 hour  Intake 1368.24 ml  Output 1500 ml  Net -131.76 ml     Physical Exam  Gen:- Awake Alert,  in no apparent distress  HEENT:- [place].AT, No sclera icterus Neck-Supple Neck,No JVD,.  Lungs-  CTAB , fair symmetrical air movement CV- S1, S2 normal, irregularly irregular Abd-  +ve B.Sounds, Abd Soft, No tenderness,    Extremity/Skin:- No  edema, pedal pulses present Psych-affect is appropriate, oriented x3 Neuro-no new focal deficits, no tremors   Data Review:   Micro Results Recent Results (from the past 240 hour(s))  Resp Panel by RT-PCR (Flu A&B, Covid) Nasopharyngeal Swab     Status: None   Collection Time: 10/10/20  9:38 AM   Specimen: Nasopharyngeal Swab; Nasopharyngeal(NP) swabs in vial transport medium  Result Value Ref Range Status   SARS Coronavirus 2 by RT PCR NEGATIVE NEGATIVE Final    Comment: (NOTE) SARS-CoV-2 target nucleic acids are NOT DETECTED.  The SARS-CoV-2 RNA is generally detectable in upper respiratory specimens during the acute phase of infection. The lowest concentration of SARS-CoV-2  viral copies this assay can detect is 138 copies/mL. A negative result does not preclude SARS-Cov-2 infection and should not be used as the sole basis for treatment or other patient management decisions. A negative result may occur with  improper specimen collection/handling, submission of specimen other than nasopharyngeal swab, presence of viral mutation(s) within the areas targeted by this assay, and inadequate number of  viral copies(<138 copies/mL). A negative result must be combined with clinical observations, patient history, and epidemiological information. The expected result is Negative.  Fact Sheet for Patients:  EntrepreneurPulse.com.au  Fact Sheet for Healthcare Providers:  IncredibleEmployment.be  This test is no t yet approved or cleared by the [place] FDA and  has been authorized for detection and/or diagnosis of SARS-CoV-2 by FDA under an Emergency Use Authorization (EUA). This EUA will remain  in effect (meaning this test can be used) for the duration of the COVID-19 declaration under Section 564(b)(1) of the Act, 21 U.S.C.section 360bbb-3(b)(1), unless the authorization is terminated  or revoked sooner.       Influenza A by PCR NEGATIVE NEGATIVE Final   Influenza B by PCR NEGATIVE NEGATIVE Final    Comment: (NOTE) The Xpert Xpress SARS-CoV-2/FLU/RSV plus assay is intended as an aid in the diagnosis of influenza from Nasopharyngeal swab specimens and should not be used as a sole basis for treatment. Nasal washings and aspirates are unacceptable for Xpert Xpress SARS-CoV-2/FLU/RSV testing.  Fact Sheet for Patients: EntrepreneurPulse.com.au  Fact Sheet for Healthcare Providers: IncredibleEmployment.be  This test is not yet approved or cleared by the [place] FDA and has been authorized for detection and/or diagnosis of SARS-CoV-2 by FDA under an Emergency Use Authorization  (EUA). This EUA will remain in effect (meaning this test can be used) for the duration of the COVID-19 declaration under Section 564(b)(1) of the Act, 21 U.S.C. section 360bbb-3(b)(1), unless the authorization is terminated or revoked.  Performed at [facility], [address] [place], [place] [address]   MRSA Next Gen by PCR, Nasal     Status: None   Collection Time: 10/10/20  3:14 PM   Specimen: Nasal Mucosa; Nasal Swab  Result Value Ref Range Status   MRSA by PCR Next Gen NOT DETECTED NOT DETECTED Final    Comment: (NOTE) The GeneXpert MRSA Assay (FDA approved for NASAL specimens only), is one component of a comprehensive MRSA colonization surveillance program. It is not intended to diagnose MRSA infection nor to guide or monitor treatment for MRSA infections. Test performance is not FDA approved in patients less than 47 years old. Performed at [facility], [address] [place], [place] [address]     Radiology Reports DG Chest Portable 1 View  Result Date: 10/10/2020 CLINICAL DATA:  86 year old male with weakness and shortness of breath. Tachycardia. EXAM: PORTABLE CHEST 1 VIEW COMPARISON:  Chest radiographs 05/24/2015 and earlier. FINDINGS: Portable AP semi upright view at 0914 hours. Lung volumes and mediastinal contours are stable and within normal limits. Visualized tracheal air column is within normal limits. Chronic blunting of the right lateral costophrenic angle is stable. Allowing for portable technique the lungs are clear. No pneumothorax. No acute osseous abnormality identified. IMPRESSION: Negative portable chest. Electronically Signed   By: Genevie Ann M.D.   On: 10/10/2020 09:27   ECHOCARDIOGRAM COMPLETE  Result Date: 10/10/2020    ECHOCARDIOGRAM REPORT   Patient Name:   Lizzie Cokley. Date of Exam: 10/10/2020 Medical Rec #:  [id number]           Height:       68.0 in Accession #:    [id number]          Weight:       154.0 lb Date of Birth:  Mar 06, 1935            BSA:           1.829 m Patient  Age:    73 years            BP:           99/70 mmHg Patient Gender: M                   HR:           90 bpm. Exam Location:  [facility] Procedure: 2D Echo, Cardiac Doppler and Color Doppler Indications:    Dyspnea  History:        Patient has prior history of Echocardiogram examinations, most                 recent 10/25/2014. Arrythmias:Atrial Fibrillation; Risk                 Factors:Hypertension and Diabetes.  Sonographer:    Wenda Low Referring Phys: [place]  1. Left ventricular ejection fraction, by estimation, is 65 to 70%. The left ventricle has normal function. The left ventricle has no regional wall motion abnormalities. There is severe left ventricular hypertrophy. Left ventricular diastolic parameters  are indeterminate in the setting of atrial fibrillation/flutter.  2. Right ventricular systolic function is normal. The right ventricular size is normal. There is normal pulmonary artery systolic pressure. The estimated right ventricular systolic pressure is 96.7 mmHg.  3. Left atrial size was moderately dilated.  4. The mitral valve is grossly normal. Trivial mitral valve regurgitation.  5. The aortic valve is functionally bicuspid. There is moderate calcification of the aortic valve. Aortic valve regurgitation is trivial. Moderate aortic valve stenosis. Aortic valve mean gradient measures 10.0 mmHg. Dimentionless index 0.44.  6. The inferior vena cava is dilated in size with >50% respiratory variability, suggesting right atrial pressure of 8 mmHg. FINDINGS  Left Ventricle: Left ventricular ejection fraction, by estimation, is 65 to 70%. The left ventricle has normal function. The left ventricle has no regional wall motion abnormalities. The left ventricular internal cavity size was normal in size. There is  severe left ventricular hypertrophy. Left ventricular diastolic parameters are indeterminate. Right Ventricle: The right ventricular size is  normal. No increase in right ventricular wall thickness. Right ventricular systolic function is normal. There is normal pulmonary artery systolic pressure. The tricuspid regurgitant velocity is 2.28 m/s, and  with an assumed right atrial pressure of 8 mmHg, the estimated right ventricular systolic pressure is 89.3 mmHg. Left Atrium: Left atrial size was moderately dilated. Right Atrium: Right atrial size was normal in size. Pericardium: There is no evidence of pericardial effusion. Mitral Valve: The mitral valve is grossly normal. Trivial mitral valve regurgitation. MV peak gradient, 4.4 mmHg. The mean mitral valve gradient is 1.0 mmHg. Tricuspid Valve: The tricuspid valve is grossly normal. Tricuspid valve regurgitation is mild. Aortic Valve: The aortic valve is bicuspid. There is moderate calcification of the aortic valve. There is mild aortic valve annular calcification. Aortic valve regurgitation is trivial. Moderate aortic stenosis is present. Aortic valve mean gradient measures 10.0 mmHg. Aortic valve peak gradient measures 16.5 mmHg. Aortic valve area, by VTI measures 1.39 cm. Pulmonic Valve: The pulmonic valve was grossly normal. Pulmonic valve regurgitation is trivial. Aorta: The aortic root is normal in size and structure. Venous: The inferior vena cava is dilated in size with greater than 50% respiratory variability, suggesting right atrial pressure of 8 mmHg. IAS/Shunts: No atrial level shunt detected by color flow Doppler.  LEFT VENTRICLE PLAX 2D LVIDd:         3.80 cm  Diastology LVIDs:         2.53 cm  LV e' medial:    8.06 cm/s LV PW:         1.58 cm  LV E/e' medial:  11.2 LV IVS:        1.66 cm  LV e' lateral:   15.30 cm/s LVOT diam:     2.00 cm  LV E/e' lateral: 5.9 LV SV:         58 LV SV Index:   32 LVOT Area:     3.14 cm  RIGHT VENTRICLE RV Basal diam:  3.64 cm RV Mid diam:    3.54 cm TAPSE (M-mode): 2.0 cm LEFT ATRIUM              Index       RIGHT ATRIUM           Index LA diam:        4.50  cm  2.46 cm/m  RA Area:     16.50 cm LA Vol (A2C):   103.0 ml 56.32 ml/m RA Volume:   44.70 ml  24.44 ml/m LA Vol (A4C):   82.0 ml  44.84 ml/m LA Biplane Vol: 94.0 ml  51.40 ml/m  AORTIC VALVE AV Area (Vmax):    1.39 cm AV Area (Vmean):   1.22 cm AV Area (VTI):     1.39 cm AV Vmax:           203.00 cm/s AV Vmean:          151.000 cm/s AV VTI:            0.417 m AV Peak Grad:      16.5 mmHg AV Mean Grad:      10.0 mmHg LVOT Vmax:         90.00 cm/s LVOT Vmean:        58.800 cm/s LVOT VTI:          0.184 m LVOT/AV VTI ratio: 0.44  AORTA Ao Root diam: 3.40 cm Ao Asc diam:  3.60 cm MITRAL VALVE               TRICUSPID VALVE MV Area (PHT): 4.65 cm    TR Peak grad:   20.8 mmHg MV Area VTI:   2.78 cm    TR Vmax:        228.00 cm/s MV Peak grad:  4.4 mmHg MV Mean grad:  1.0 mmHg    SHUNTS MV Vmax:       1.05 m/s    Systemic VTI:  0.18 m MV Vmean:      43.3 cm/s   Systemic Diam: 2.00 cm MV Decel Time: 163 msec MV E velocity: 90.20 cm/s Rozann Lesches MD Electronically signed by Rozann Lesches MD Signature Date/Time: 10/10/2020/3:44:51 PM    Final      CBC Recent Labs  Lab 10/10/20 0913 10/11/20 0411 10/12/20 0446  WBC 5.1 3.3* 3.7*  HGB 14.3 13.3 14.0  HCT 42.9 39.9 42.0  PLT 150 136* 111*  MCV 95.5 95.9 95.5  MCH 31.8 32.0 31.8  MCHC 33.3 33.3 33.3  RDW 13.2 13.2 13.5  LYMPHSABS 0.5*  --   --   MONOABS 0.4  --   --   EOSABS 0.0  --   --   BASOSABS 0.0  --   --     Chemistries  Recent Labs  Lab 10/10/20 0913 10/10/20 1105 10/11/20 0411 10/12/20 0446  NA 138  --  133*  131*  K 4.0  --  4.0 4.0  CL 103  --  103 99  CO2 24  --  22 23  GLUCOSE 141*  --  131* 123*  BUN 30*  --  19 15  CREATININE 1.16  --  0.80 0.85  CALCIUM 9.8  --  8.6* 8.8*  MG  --  1.8  --   --   AST 84*  --   --  79*  ALT 81*  --   --  76*  ALKPHOS 62  --   --  56  BILITOT 0.7  --   --  0.9    ------------------------------------------------------------------------------------------------------------------ No results for input(s): CHOL, HDL, LDLCALC, TRIG, CHOLHDL, LDLDIRECT in the last 72 hours.  Lab Results  Component Value Date   HGBA1C 5.9 (H) 10/25/2014   ------------------------------------------------------------------------------------------------------------------ Recent Labs    10/10/20 0914  TSH 2.438   ------------------------------------------------------------------------------------------------------------------ No results for input(s): VITAMINB12, FOLATE, FERRITIN, TIBC, IRON, RETICCTPCT in the last 72 hours.  Coagulation profile Recent Labs  Lab 10/10/20 0913  INR 1.0    No results for input(s): DDIMER in the last 72 hours.  Cardiac Enzymes No results for input(s): CKMB, TROPONINI, MYOGLOBIN in the last 168 hours.  Invalid input(s): CK ------------------------------------------------------------------------------------------------------------------    Component Value Date/Time   BNP 90.0 10/25/2014 0457     Roxan Hockey M.D on 10/12/2020 at 6:26 PM  Go to [email or web address] - for contact info  [facility] - Office  [phone]

## 2020-10-12 NOTE — Progress Notes (Signed)
Cardizem drip paused at 2205 d/t hypotension.  Patient is asymptomatic.  Denies CP, SOB, dizziness.  Patient's skin is warm and dry.  Patient responsive to verbal stimuli.

## 2020-10-13 LAB — CBC
HCT: 36.8 % — ABNORMAL LOW (ref 39.0–52.0)
Hemoglobin: 12.7 g/dL — ABNORMAL LOW (ref 13.0–17.0)
MCH: 32.2 pg (ref 26.0–34.0)
MCHC: 34.5 g/dL (ref 30.0–36.0)
MCV: 93.2 fL (ref 80.0–100.0)
Platelets: 99 10*3/uL — ABNORMAL LOW (ref 150–400)
RBC: 3.95 MIL/uL — ABNORMAL LOW (ref 4.22–5.81)
RDW: 13.3 % (ref 11.5–15.5)
WBC: 3.5 10*3/uL — ABNORMAL LOW (ref 4.0–10.5)
nRBC: 0 % (ref 0.0–0.2)

## 2020-10-13 LAB — HEPARIN LEVEL (UNFRACTIONATED): Heparin Unfractionated: 0.53 IU/mL (ref 0.30–0.70)

## 2020-10-13 MED ORDER — APIXABAN 5 MG PO TABS
5.0000 mg | ORAL_TABLET | Freq: Two times a day (BID) | ORAL | Status: DC
  Administered 2020-10-13 – 2020-10-14 (×3): 5 mg via ORAL
  Filled 2020-10-13 (×3): qty 1

## 2020-10-13 MED ORDER — METOPROLOL TARTRATE 25 MG PO TABS
25.0000 mg | ORAL_TABLET | Freq: Three times a day (TID) | ORAL | Status: DC
  Administered 2020-10-13 (×3): 25 mg via ORAL
  Filled 2020-10-13 (×3): qty 1

## 2020-10-13 MED ORDER — DILTIAZEM HCL 30 MG PO TABS
30.0000 mg | ORAL_TABLET | Freq: Four times a day (QID) | ORAL | Status: DC
  Administered 2020-10-13 (×2): 30 mg via ORAL
  Filled 2020-10-13 (×2): qty 1

## 2020-10-13 NOTE — Progress Notes (Signed)
Patient Demographics:    Kyle [name], is a 86 y.o. male, DOB - [birth year], YVO:[id number]  Admit date - 10/10/2020   Admitting Physician Kyle Liston Denton Brick, MD  Outpatient Primary MD for the patient is Redmond School, MD  LOS - 2   Chief Complaint  Patient presents with   Tachycardia        Subjective:    [name] today has no fevers, no emesis,  No chest pain,   -- Wife and another male relative at bedside Iv Cardizem stopped due to Hypotension -Complains of generalized weakness -  Assessment  & Plan :    Principal Problem:   New onset a-fib/Flutter  Active Problems:   Aortic stenosis, moderate/Aortic valve mean gradient is 10.0 mmHg   New Onset/Atrial flutter, paroxysmal (HCC)   Ocular Myasthenia (HCC)   Convulsions/seizures (HCC)   Essential hypertension, benign  Brief Summary:- 86 y.o. male with past medical history relevant for nontoxic multinodular goiter, history of meningioma status post prior  resection, ocular myasthenia gravis on Mestinon,History of seizures, on Keppra, no seizures in almost 20 years,  has peripheral neuropathy, takes gabapentin, history of chronic back pain, HTN, history of prostate cancer, prior history of DVT admitted on 10/10/2020 with new onset atrial flutter with RVR   A/p 1)New onset A. fib/a flutter with RVR---- -Stopped IV Cardizem drip due to low BP  -Start p.o. Cardizem 30 mg every 6 hours, titrate metoprolol to 25 mg every 8 hours--if BP allows -Stop IV heparin and transition to p.o. Eliquis -TSH WNL, electrolytes WNL Echo with EF of 65 to 70%, severe LVH noted, moderate left atrial enlargement noted, right atrium is not enlarged, moderate aortic stenosis noted  CHA2DS2- VASc score   is = at least 3 based on age and history of HTN    which is  equal to = 3.2 % annual risk of stroke This patients CHA2DS2-VASc Score and unadjusted Ischemic Stroke  Rate (% per year) is equal to 3.2 % stroke rate/year from a score of 3    2)HTN--stable, continue losartan, continue IV Cardizem drip for rate control for A. fib a flutter as above #1   3) ocular myasthenia gravis--- continue Mestinon   4) polyneuropathy----continue gabapentin   5)Sz disorder--- continue Keppra   6) mild LFT elevation--- AST is 84 >>79 and  ALT is 81 >76, T bili 0.7 - alk phos 62>>56 -Okay to continue simvastatin, monitor LFTs closely   7) moderate aortic stenosis--- noted on echo from 10/10/2020 outpatient follow-up with cardiology for ongoing monitoring advised -Aortic valve mean gradient  measures 10.0 mmHg. Aortic valve peak gradient measures 16.5 mmHg. Aortic  valve area, by VTI measures 1.39 cm.  8) generalized weakness and deconditioning--- PT eval requested   Disposition/Need for in-Hospital Stay- patient unable to be discharged at this time due to -A. fib/a flutter requiring rate control   Dispo: The patient is from: Home              Anticipated d/c is to: Home              Anticipated d/c date is: 1 day              Patient currently is not medically stable to  d/c. Barriers: Not Clinically Stable-  Code Status :  -  Code Status: Full Code   Family Communication:    NA (patient is alert, awake and coherent)  Discussed with wife at bedside  Consults  :  na  DVT Prophylaxis  : - SCDs  SCDs Start: 10/10/20 1754 Place TED hose Start: 10/10/20 1754 apixaban (ELIQUIS) tablet 5 mg   Lab Results  Component Value Date   PLT 99 (L) 10/13/2020    Inpatient Medications  Scheduled Meds:  apixaban  5 mg Oral BID   Chlorhexidine Gluconate Cloth  6 each Topical Daily   diltiazem  30 mg Oral Q6H   gabapentin  600 mg Oral Daily   levETIRAcetam  500 mg Oral BID   metoprolol tartrate  25 mg Oral TID   multivitamin with minerals  1 tablet Oral Daily   omega-3 acid ethyl esters  1 g Oral BID   pyridostigmine  90 mg Oral QID   simvastatin  20 mg Oral QHS    sodium chloride flush  3 mL Intravenous Q12H   sodium chloride flush  3 mL Intravenous Q12H   Continuous Infusions:  sodium chloride     PRN Meds:.sodium chloride, acetaminophen **OR** acetaminophen, bisacodyl, labetalol, ondansetron **OR** ondansetron (ZOFRAN) IV, polyethylene glycol, polyvinyl alcohol, sodium chloride flush, tiZANidine, traZODone    Anti-infectives (From admission, onward)    None         Objective:   Vitals:   10/13/20 1105 10/13/20 1110 10/13/20 1115 10/13/20 1153  BP:   129/68   Pulse: 68 71 75 72  Resp: _0 (!) 21  Temp:    97.8 F (36.6 C)  TempSrc:    Oral  SpO2: 100% 98% 98% 99%  Weight:      Height:        Wt Readings from Last 3 Encounters:  10/13/20 71 kg  08/21/20 70.8 kg  05/15/20 74.6 kg     Intake/Output Summary (Last 24 hours) at 10/13/2020 1311 Last data filed at 10/13/2020 0840 Gross per 24 hour  Intake 973.85 ml  Output 325 ml  Net 648.85 ml     Physical Exam  Gen:- Awake Alert,  in no apparent distress  HEENT:- [place].AT, No sclera icterus Neck-Supple Neck,No JVD,.  Lungs-  CTAB , fair symmetrical air movement CV- S1, S2 normal, irregularly irregular, less tachycardic Abd-  +ve B.Sounds, Abd Soft, No tenderness,    Extremity/Skin:- No  edema, pedal pulses present Psych-affect is appropriate, oriented x3 Neuro-generalized weakness, no new focal deficits, no tremors   Data Review:   Micro Results Recent Results (from the past 240 hour(s))  Resp Panel by RT-PCR (Flu A&B, Covid) Nasopharyngeal Swab     Status: None   Collection Time: 10/10/20  9:38 AM   Specimen: Nasopharyngeal Swab; Nasopharyngeal(NP) swabs in vial transport medium  Result Value Ref Range Status   SARS Coronavirus 2 by RT PCR NEGATIVE NEGATIVE Final    Comment: (NOTE) SARS-CoV-2 target nucleic acids are NOT DETECTED.  The SARS-CoV-2 RNA is generally detectable in upper respiratory specimens during the acute phase of infection. The  lowest concentration of SARS-CoV-2 viral copies this assay can detect is 138 copies/mL. A negative result does not preclude SARS-Cov-2 infection and should not be used as the sole basis for treatment or other patient management decisions. A negative result may occur with  improper specimen collection/handling, submission of specimen other than nasopharyngeal swab, presence of viral mutation(s) within the areas targeted  by this assay, and inadequate number of viral copies(<138 copies/mL). A negative result must be combined with clinical observations, patient history, and epidemiological information. The expected result is Negative.  Fact Sheet for Patients:  EntrepreneurPulse.com.au  Fact Sheet for Healthcare Providers:  IncredibleEmployment.be  This test is no t yet approved or cleared by the [place] FDA and  has been authorized for detection and/or diagnosis of SARS-CoV-2 by FDA under an Emergency Use Authorization (EUA). This EUA will remain  in effect (meaning this test can be used) for the duration of the COVID-19 declaration under Section 564(b)(1) of the Act, 21 U.S.C.section 360bbb-3(b)(1), unless the authorization is terminated  or revoked sooner.       Influenza A by PCR NEGATIVE NEGATIVE Final   Influenza B by PCR NEGATIVE NEGATIVE Final    Comment: (NOTE) The Xpert Xpress SARS-CoV-2/FLU/RSV plus assay is intended as an aid in the diagnosis of influenza from Nasopharyngeal swab specimens and should not be used as a sole basis for treatment. Nasal washings and aspirates are unacceptable for Xpert Xpress SARS-CoV-2/FLU/RSV testing.  Fact Sheet for Patients: EntrepreneurPulse.com.au  Fact Sheet for Healthcare Providers: IncredibleEmployment.be  This test is not yet approved or cleared by the [place] FDA and has been authorized for detection and/or diagnosis of SARS-CoV-2 by FDA under  an Emergency Use Authorization (EUA). This EUA will remain in effect (meaning this test can be used) for the duration of the COVID-19 declaration under Section 564(b)(1) of the Act, 21 U.S.C. section 360bbb-3(b)(1), unless the authorization is terminated or revoked.  Performed at [facility], [address] [place], [place] [address]   MRSA Next Gen by PCR, Nasal     Status: None   Collection Time: 10/10/20  3:14 PM   Specimen: Nasal Mucosa; Nasal Swab  Result Value Ref Range Status   MRSA by PCR Next Gen NOT DETECTED NOT DETECTED Final    Comment: (NOTE) The GeneXpert MRSA Assay (FDA approved for NASAL specimens only), is one component of a comprehensive MRSA colonization surveillance program. It is not intended to diagnose MRSA infection nor to guide or monitor treatment for MRSA infections. Test performance is not FDA approved in patients less than 81 years old. Performed at [facility], [address] [place],  [address]     Radiology Reports DG Chest Portable 1 View  Result Date: 10/10/2020 CLINICAL DATA:  86 year old male with weakness and shortness of breath. Tachycardia. EXAM: PORTABLE CHEST 1 VIEW COMPARISON:  Chest radiographs 05/24/2015 and earlier. FINDINGS: Portable AP semi upright view at 0914 hours. Lung volumes and mediastinal contours are stable and within normal limits. Visualized tracheal air column is within normal limits. Chronic blunting of the right lateral costophrenic angle is stable. Allowing for portable technique the lungs are clear. No pneumothorax. No acute osseous abnormality identified. IMPRESSION: Negative portable chest. Electronically Signed   By: Genevie Ann M.D.   On: 10/10/2020 09:27   ECHOCARDIOGRAM COMPLETE  Result Date: 10/10/2020    ECHOCARDIOGRAM REPORT   Patient Name:   Yoon Barca. Date of Exam: 10/10/2020 Medical Rec #:  [id number]           Height:       68.0 in Accession #:    [id number]          Weight:       154.0 lb Date of  Birth:  [birth year]            BSA:  1.829 m Patient Age:    29 years            BP:           99/70 mmHg Patient Gender: M                   HR:           90 bpm. Exam Location:  [facility] Procedure: 2D Echo, Cardiac Doppler and Color Doppler Indications:    Dyspnea  History:        Patient has prior history of Echocardiogram examinations, most                 recent 10/25/2014. Arrythmias:Atrial Fibrillation; Risk                 Factors:Hypertension and Diabetes.  Sonographer:    Wenda Low Referring Phys: Snyder  1. Left ventricular ejection fraction, by estimation, is 65 to 70%. The left ventricle has normal function. The left ventricle has no regional wall motion abnormalities. There is severe left ventricular hypertrophy. Left ventricular diastolic parameters  are indeterminate in the setting of atrial fibrillation/flutter.  2. Right ventricular systolic function is normal. The right ventricular size is normal. There is normal pulmonary artery systolic pressure. The estimated right ventricular systolic pressure is 57.3 mmHg.  3. Left atrial size was moderately dilated.  4. The mitral valve is grossly normal. Trivial mitral valve regurgitation.  5. The aortic valve is functionally bicuspid. There is moderate calcification of the aortic valve. Aortic valve regurgitation is trivial. Moderate aortic valve stenosis. Aortic valve mean gradient measures 10.0 mmHg. Dimentionless index 0.44.  6. The inferior vena cava is dilated in size with >50% respiratory variability, suggesting right atrial pressure of 8 mmHg. FINDINGS  Left Ventricle: Left ventricular ejection fraction, by estimation, is 65 to 70%. The left ventricle has normal function. The left ventricle has no regional wall motion abnormalities. The left ventricular internal cavity size was normal in size. There is  severe left ventricular hypertrophy. Left ventricular diastolic parameters are indeterminate. Right  Ventricle: The right ventricular size is normal. No increase in right ventricular wall thickness. Right ventricular systolic function is normal. There is normal pulmonary artery systolic pressure. The tricuspid regurgitant velocity is 2.28 m/s, and  with an assumed right atrial pressure of 8 mmHg, the estimated right ventricular systolic pressure is 22.0 mmHg. Left Atrium: Left atrial size was moderately dilated. Right Atrium: Right atrial size was normal in size. Pericardium: There is no evidence of pericardial effusion. Mitral Valve: The mitral valve is grossly normal. Trivial mitral valve regurgitation. MV peak gradient, 4.4 mmHg. The mean mitral valve gradient is 1.0 mmHg. Tricuspid Valve: The tricuspid valve is grossly normal. Tricuspid valve regurgitation is mild. Aortic Valve: The aortic valve is bicuspid. There is moderate calcification of the aortic valve. There is mild aortic valve annular calcification. Aortic valve regurgitation is trivial. Moderate aortic stenosis is present. Aortic valve mean gradient measures 10.0 mmHg. Aortic valve peak gradient measures 16.5 mmHg. Aortic valve area, by VTI measures 1.39 cm. Pulmonic Valve: The pulmonic valve was grossly normal. Pulmonic valve regurgitation is trivial. Aorta: The aortic root is normal in size and structure. Venous: The inferior vena cava is dilated in size with greater than 50% respiratory variability, suggesting right atrial pressure of 8 mmHg. IAS/Shunts: No atrial level shunt detected by color flow Doppler.  LEFT VENTRICLE PLAX 2D LVIDd:  3.80 cm  Diastology LVIDs:         2.53 cm  LV e' medial:    8.06 cm/s LV PW:         1.58 cm  LV E/e' medial:  11.2 LV IVS:        1.66 cm  LV e' lateral:   15.30 cm/s LVOT diam:     2.00 cm  LV E/e' lateral: 5.9 LV SV:         58 LV SV Index:   32 LVOT Area:     3.14 cm  RIGHT VENTRICLE RV Basal diam:  3.64 cm RV Mid diam:    3.54 cm TAPSE (M-mode): 2.0 cm LEFT ATRIUM              Index       RIGHT  ATRIUM           Index LA diam:        4.50 cm  2.46 cm/m  RA Area:     16.50 cm LA Vol (A2C):   103.0 ml 56.32 ml/m RA Volume:   44.70 ml  24.44 ml/m LA Vol (A4C):   82.0 ml  44.84 ml/m LA Biplane Vol: 94.0 ml  51.40 ml/m  AORTIC VALVE AV Area (Vmax):    1.39 cm AV Area (Vmean):   1.22 cm AV Area (VTI):     1.39 cm AV Vmax:           203.00 cm/s AV Vmean:          151.000 cm/s AV VTI:            0.417 m AV Peak Grad:      16.5 mmHg AV Mean Grad:      10.0 mmHg LVOT Vmax:         90.00 cm/s LVOT Vmean:        58.800 cm/s LVOT VTI:          0.184 m LVOT/AV VTI ratio: 0.44  AORTA Ao Root diam: 3.40 cm Ao Asc diam:  3.60 cm MITRAL VALVE               TRICUSPID VALVE MV Area (PHT): 4.65 cm    TR Peak grad:   20.8 mmHg MV Area VTI:   2.78 cm    TR Vmax:        228.00 cm/s MV Peak grad:  4.4 mmHg MV Mean grad:  1.0 mmHg    SHUNTS MV Vmax:       1.05 m/s    Systemic VTI:  0.18 m MV Vmean:      43.3 cm/s   Systemic Diam: 2.00 cm MV Decel Time: 163 msec MV E velocity: 90.20 cm/s Rozann Lesches MD Electronically signed by Rozann Lesches MD Signature Date/Time: 10/10/2020/3:44:51 PM    Final      CBC Recent Labs  Lab 10/10/20 0913 10/11/20 0411 10/12/20 0446 10/13/20 0332  WBC 5.1 3.3* 3.7* 3.5*  HGB 14.3 13.3 14.0 12.7*  HCT 42.9 39.9 42.0 36.8*  PLT 150 136* 111* 99*  MCV 95.5 95.9 95.5 93.2  MCH 31.8 32.0 31.8 32.2  MCHC 33.3 33.3 33.3 34.5  RDW 13.2 13.2 13.5 13.3  LYMPHSABS 0.5*  --   --   --   MONOABS 0.4  --   --   --   EOSABS 0.0  --   --   --   BASOSABS 0.0  --   --   --  Chemistries  Recent Labs  Lab 10/10/20 0913 10/10/20 1105 10/11/20 0411 10/12/20 0446  NA 138  --  133* 131*  K 4.0  --  4.0 4.0  CL 103  --  103 99  CO2 24  --  22 23  GLUCOSE 141*  --  131* 123*  BUN 30*  --  19 15  CREATININE 1.16  --  0.80 0.85  CALCIUM 9.8  --  8.6* 8.8*  MG  --  1.8  --   --   AST 84*  --   --  79*  ALT 81*  --   --  76*  ALKPHOS 62  --   --  56  BILITOT 0.7  --   --  0.9    ------------------------------------------------------------------------------------------------------------------ No results for input(s): CHOL, HDL, LDLCALC, TRIG, CHOLHDL, LDLDIRECT in the last 72 hours.  Lab Results  Component Value Date   HGBA1C 5.9 (H) 10/25/2014   ------------------------------------------------------------------------------------------------------------------ No results for input(s): TSH, T4TOTAL, T3FREE, THYROIDAB in the last 72 hours.  Invalid input(s): FREET3  ------------------------------------------------------------------------------------------------------------------ No results for input(s): VITAMINB12, FOLATE, FERRITIN, TIBC, IRON, RETICCTPCT in the last 72 hours.  Coagulation profile Recent Labs  Lab 10/10/20 0913  INR 1.0    No results for input(s): DDIMER in the last 72 hours.  Cardiac Enzymes No results for input(s): CKMB, TROPONINI, MYOGLOBIN in the last 168 hours.  Invalid input(s): CK ------------------------------------------------------------------------------------------------------------------    Component Value Date/Time   BNP 90.0 10/25/2014 0457   Roxan Hockey M.D on 10/13/2020 at 1:11 PM  Go to [email or web address] - for contact info  [facility] - Office  [phone]

## 2020-10-13 NOTE — Progress Notes (Signed)
ANTICOAGULATION CONSULT NOTE - Initial Consult  Pharmacy Consult for apixaban dosing Indication: atrial fibrillation  Allergies  Allergen Reactions   Iodinated Diagnostic Agents Hives     pt was premedicated/hives reaction occurred 24 hours post injection, Onset Date: [id number]    Sulfa Antibiotics Swelling and Rash   Bactericin [Bacitracin] Other (See Comments)    Unknown reaction     Patient Measurements: Height: 5\' 8"  (172.7 cm) Weight: 71 kg (156 lb 8.4 oz) IBW/kg (Calculated) : 68.4   Vital Signs: Temp: 98.4 F (36.9 C) (07/03 0808) Temp Source: Oral (07/03 0808) BP: 90/56 (07/03 0600) Pulse Rate: 70 (07/03 0808)  Labs: Recent Labs    10/10/20 0913 10/10/20 1923 10/11/20 0411 10/12/20 0446 10/12/20 1626 10/13/20 0332  HGB 14.3  --  13.3 14.0  --  12.7*  HCT 42.9  --  39.9 42.0  --  36.8*  PLT 150  --  136* 111*  --  99*  LABPROT 13.1  --   --   --   --   --   INR 1.0  --   --   --   --   --   HEPARINUNFRC  --    < > 0.30 0.25* 0.49 0.53  CREATININE 1.16  --  0.80 0.85  --   --    < > = values in this interval not displayed.    Estimated Creatinine Clearance: 63.7 mL/min (by C-G formula based on SCr of 0.85 mg/dL). Lab Results  Component Value Date   CREATININE 0.85 10/12/2020   CREATININE 0.80 10/11/2020   CREATININE 1.16 10/10/2020   Filed Weights   10/10/20 1504 10/12/20 0454 10/13/20 0500  Weight: 69.4 kg (153 lb) 69.4 kg (153 lb) 71 kg (156 lb 8.4 oz)     Medical History: Past Medical History:  Diagnosis Date   Arthritis    Auto immune neutropenia (HCC)    Bullous pemphigoid    Cancer (HCC)    Prostate   Cervical spondylosis without myelopathy 01/22/2016   DVT (deep venous thrombosis) ([place])    a. Has had a previous right lower extremity DVT in the setting of hospitalization and surgery (after his brain tumor was resected in 1993) but is no longer on Coumadin.   Dyslipidemia    History of prostate cancer    Hypertension    Meningioma  ([place])    a. s/p resection 1990s.   Myasthenia ([place]) 02/14/2013   Nocturnal leg cramps    Ocular myasthenia gravis (HCC)    Peripheral neuropathy 11/23/2018   Seizures (HCC)    Sinus bradycardia    Sleep paralysis    Thyroid nodule, cold     Medications:  Medications Prior to Admission  Medication Sig Dispense Refill Last Dose   fish oil-omega-3 fatty acids 1000 MG capsule Take 1 g by mouth 3 (three) times daily.   10/10/2020   gabapentin (NEURONTIN) 600 MG tablet TAKE 1 TABLET BY MOUTH  DAILY (Patient taking differently: Take 600 mg by mouth daily.) 90 tablet 3 04/21/3233   Garlic (GARLIQUE PO) Take 1 tablet by mouth daily.   10/10/2020   levETIRAcetam (KEPPRA) 500 MG tablet Take 1/2 tablet in the morning, take 1 tablet in the evening (Patient taking differently: Take 250-500 mg by mouth 2 (two) times daily. Take 1/2 tablet in the morning, take 1 tablet in the evening) 135 tablet 3 10/10/2020   losartan (COZAAR) 25 MG tablet Take 25 mg by mouth in the morning and at  bedtime.   10/10/2020   MAGNESIUM CITRATE PO Take 1 tablet by mouth daily.   10/10/2020   niacinamide 500 MG tablet Take 500 mg by mouth 3 (three) times daily.   10/10/2020   Polyethyl Glycol-Propyl Glycol 0.4-0.3 % SOLN Place 1 drop into both eyes 3 (three) times daily as needed (for dry eyes).   PRN   pyridostigmine (MESTINON) 60 MG tablet TAKE 1 AND 1/2 TABLETS BY  MOUTH 4 TIMES DAILY (Patient taking differently: Take 90 mg by mouth 4 (four) times daily.) 540 tablet 3 10/10/2020   simvastatin (ZOCOR) 20 MG tablet Take 20 mg by mouth at bedtime.    10/09/2020   tiZANidine (ZANAFLEX) 4 MG tablet Take 4 mg by mouth at bedtime as needed for muscle spasms.   Past Week   diclofenac (VOLTAREN) 75 MG EC tablet Take 1 tablet (75 mg total) by mouth 2 (two) times daily. (Patient not taking: Reported on 10/10/2020) 180 tablet 3 Not Taking   Scheduled:   Chlorhexidine Gluconate Cloth  6 each Topical Daily   gabapentin  600 mg Oral Daily    levETIRAcetam  500 mg Oral BID   metoprolol tartrate  25 mg Oral BID   multivitamin with minerals  1 tablet Oral Daily   omega-3 acid ethyl esters  1 g Oral BID   pyridostigmine  90 mg Oral QID   simvastatin  20 mg Oral QHS   sodium chloride flush  3 mL Intravenous Q12H   sodium chloride flush  3 mL Intravenous Q12H   Infusions:   sodium chloride     diltiazem (CARDIZEM) infusion Stopped (10/12/20 2205)   heparin 1,150 Units/hr (10/13/20 0752)   PRN: sodium chloride, acetaminophen **OR** acetaminophen, bisacodyl, labetalol, ondansetron **OR** ondansetron (ZOFRAN) IV, polyethylene glycol, polyvinyl alcohol, sodium chloride flush, tiZANidine, traZODone Anti-infectives (From admission, onward)    None       Assessment: Kyle [name]. a 86 y.o. male requires anticoagulation with apixaban for the indication of  atrial fibrillation. Apixban will be started following pharmacy protocol per pharmacy consult.   Goal of Therapy:  Patient to be properly anticoagulated with apixaban Monitor platelets by anticoagulation protocol: yes   Plan:   Apixaban 5mg  po BID CBC MWF Monitor for signs and symptoms of bleeding   Thomasenia Sales, PharmD Clinical Pharmacist 10/13/2020,8:31 AM

## 2020-10-14 LAB — COMPREHENSIVE METABOLIC PANEL
ALT: 117 U/L — ABNORMAL HIGH (ref 0–44)
AST: 128 U/L — ABNORMAL HIGH (ref 15–41)
Albumin: 3 g/dL — ABNORMAL LOW (ref 3.5–5.0)
Alkaline Phosphatase: 73 U/L (ref 38–126)
Anion gap: 7 (ref 5–15)
BUN: 19 mg/dL (ref 8–23)
CO2: 25 mmol/L (ref 22–32)
Calcium: 8.3 mg/dL — ABNORMAL LOW (ref 8.9–10.3)
Chloride: 97 mmol/L — ABNORMAL LOW (ref 98–111)
Creatinine, Ser: 0.86 mg/dL (ref 0.61–1.24)
GFR, Estimated: >60 mL/min (ref >60)
Glucose, Bld: 106 mg/dL — ABNORMAL HIGH (ref 70–99)
Potassium: 3.5 mmol/L (ref 3.5–5.1)
Sodium: 129 mmol/L — ABNORMAL LOW (ref 135–145)
Total Bilirubin: 1 mg/dL (ref 0.3–1.2)
Total Protein: 6.2 g/dL — ABNORMAL LOW (ref 6.5–8.1)

## 2020-10-14 LAB — CBC
HCT: 37.8 % — ABNORMAL LOW (ref 39.0–52.0)
Hemoglobin: 13 g/dL (ref 13.0–17.0)
MCH: 32 pg (ref 26.0–34.0)
MCHC: 34.4 g/dL (ref 30.0–36.0)
MCV: 93.1 fL (ref 80.0–100.0)
Platelets: 107 K/uL — ABNORMAL LOW (ref 150–400)
RBC: 4.06 MIL/uL — ABNORMAL LOW (ref 4.22–5.81)
RDW: 13.6 % (ref 11.5–15.5)
WBC: 4.6 K/uL (ref 4.0–10.5)
nRBC: 0 % (ref 0.0–0.2)

## 2020-10-14 LAB — MAGNESIUM: Magnesium: 1.7 mg/dL (ref 1.7–2.4)

## 2020-10-14 MED ORDER — APIXABAN 5 MG PO TABS: 5.0000 mg | ORAL_TABLET | Freq: Two times a day (BID) | ORAL | 5 refills | Status: DC

## 2020-10-14 MED ORDER — MAGNESIUM SULFATE 2 GM/50ML IV SOLN
2.0000 g | Freq: Once | INTRAVENOUS | Status: AC
  Administered 2020-10-14: 2 g via INTRAVENOUS
  Filled 2020-10-14: qty 50

## 2020-10-14 MED ORDER — DILTIAZEM HCL ER COATED BEADS 120 MG PO CP24: 120.0000 mg | ORAL_CAPSULE | Freq: Every day | ORAL | 2 refills | Status: DC

## 2020-10-14 MED ORDER — METOPROLOL SUCCINATE ER 25 MG PO TB24: 25.0000 mg | ORAL_TABLET | Freq: Every day | ORAL | 4 refills | Status: DC

## 2020-10-14 MED ORDER — METOPROLOL SUCCINATE ER 25 MG PO TB24
25.0000 mg | ORAL_TABLET | Freq: Every day | ORAL | Status: DC
  Administered 2020-10-14: 25 mg via ORAL
  Filled 2020-10-14: qty 1

## 2020-10-14 MED ORDER — DILTIAZEM HCL ER COATED BEADS 120 MG PO CP24
120.0000 mg | ORAL_CAPSULE | Freq: Every day | ORAL | Status: DC
  Administered 2020-10-14: 120 mg via ORAL
  Filled 2020-10-14: qty 1

## 2020-10-14 MED ORDER — ACETAMINOPHEN 325 MG PO TABS: 650.0000 mg | ORAL_TABLET | Freq: Four times a day (QID) | ORAL | 4 refills | Status: DC | PRN

## 2020-10-14 MED ORDER — POTASSIUM CHLORIDE CRYS ER 20 MEQ PO TBCR
40.0000 meq | EXTENDED_RELEASE_TABLET | Freq: Once | ORAL | Status: AC
  Administered 2020-10-14: 40 meq via ORAL
  Filled 2020-10-14: qty 2

## 2020-10-14 NOTE — Discharge Summary (Signed)
Kyle [name]., is a 86 y.o. male  DOB 17-Dec-1934  MRN [id number].  Admission date:  10/10/2020  Admitting Physician  Roxan Hockey, MD  Discharge Date:  10/14/2020   Primary MD  Redmond School, MD  Recommendations for primary care physician for things to follow:  1) you are taking Eliquis/apixaban which is a blood thinner--- please Avoid ibuprofen/Advil/Aleve/Motrin/Goody Powders/Naproxen/BC powders/Meloxicam/Diclofenac/Indomethacin and other Nonsteroidal anti-inflammatory medications as these will make you more likely to bleed and can cause stomach ulcers, can also cause Kidney problems.   2)follow up with cardiologist Dr. Carlyle Dolly in 1 to 2 weeks for recheck and reevaluation-- Address: inside [facility], [place], [place], [place] [address] Phone: [phone]   Admission Diagnosis  New onset a-fib [facility]) [I48.91] Typical atrial flutter ([place]) [I48.3] Atrial flutter, paroxysmal ([place]) [I48.92]   Discharge Diagnosis  New onset a-fib ([place]) [I48.91] Typical atrial flutter ([place]) [I48.3] Atrial flutter, paroxysmal ([place]) [I48.92]    Principal Problem:   New onset a-fib/Flutter  Active Problems:   Aortic stenosis, moderate/Aortic valve mean gradient is 10.0 mmHg   New Onset/Atrial flutter, paroxysmal (HCC)   Ocular Myasthenia (HCC)   Convulsions/seizures (HCC)   Essential hypertension, benign      Past Medical History:  Diagnosis Date   Arthritis    Auto immune neutropenia (HCC)    Bullous pemphigoid    Cancer (HCC)    Prostate   Cervical spondylosis without myelopathy 01/22/2016   DVT (deep venous thrombosis) ([place])    a. Has had a previous right lower extremity DVT in the setting of hospitalization and surgery (after his brain tumor was resected in 1993) but is no longer on Coumadin.   Dyslipidemia    History of prostate cancer    Hypertension    Meningioma ([place])    a.  s/p resection 1990s.   Myasthenia ([place]) 02/14/2013   Nocturnal leg cramps    Ocular myasthenia gravis ([place])    Peripheral neuropathy 11/23/2018   Seizures ([place])    Sinus bradycardia    Sleep paralysis    Thyroid nodule, cold     Past Surgical History:  Procedure Laterality Date   BRAIN SURGERY     CHOLECYSTECTOMY     COLONOSCOPY N/A 07/25/2013   Procedure: COLONOSCOPY;  Surgeon: Jamesetta So, MD;  [facility];  Service: Gastroenterology;  Laterality: N/A;   COLONOSCOPY N/A 11/10/2016   Procedure: COLONOSCOPY;  Surgeon: Aviva Signs, MD;  Location: [facility];  Service: Gastroenterology;  Laterality: N/A;   IR CATHETER TUBE CHANGE  01/02/2017   IR RADIOLOGIST EVAL & MGMT  01/06/2017   IR RADIOLOGIST EVAL & MGMT  01/20/2017   IR RADIOLOGIST EVAL & MGMT  02/03/2017   IR RADIOLOGIST EVAL & MGMT  02/17/2017   IR SINUS/FIST TUBE CHK-NON GI  03/03/2017   RADIOACTIVE SEED IMPLANT       HPI  from the history and physical done on the day of admission:    Kyle [name]  is a  86 y.o. male with past medical history relevant for nontoxic multinodular goiter, history of meningioma status post prior  resection, ocular myasthenia gravis on Mestinon,History of seizures, on Keppra, no seizures in almost 20 years,  has peripheral neuropathy, takes gabapentin, history of chronic back pain, HTN, history of prostate cancer, prior history of DVT--sent over from PCPs office Dr. Gerarda Fraction due to dyspnea, weakness and found to be in A. fib/a flutter with RVR in the ED--EDP gave IV digoxin and IV Cardizem -EKG consistent with atrial flutter -Chest x-ray without acute findings -TSH is 2.43 -CBC WNL -Troponin is 60 in the setting of tachyarrhythmia -Creatinine is 1.16, AST is 84 and  ALT is 81, T bili 0.7 alk phos 62 -Given persistent tachycardia EDP requested hospitalist to admit -Patient denies significant chest pain he did admit dyspnea--- COVID-negative     Hospital Course:   Brief  Summary:- 86 y.o. male with past medical history relevant for nontoxic multinodular goiter, history of meningioma status post prior  resection, ocular myasthenia gravis on Mestinon,History of seizures, on Keppra, no seizures in almost 20 years,  has peripheral neuropathy, takes gabapentin, history of chronic back pain, HTN, history of prostate cancer, prior history of DVT admitted on 10/10/2020 with new onset atrial flutter with RVR     A/p 1)New onset A. fib/a flutter with RVR---- -Stopped IV Cardizem drip due to low BP  -Started p.o. Cardizem 30 mg every 6 hours, titrated metoprolol   -Stop IV heparin and transition to p.o. Eliquis -TSH WNL, electrolytes WNL Echo with EF of 65 to 70%, severe LVH noted, moderate left atrial enlargement noted, right atrium is not enlarged, moderate aortic stenosis noted  CHA2DS2- VASc score   is = at least 3 based on age and history of HTN    which is  equal to = 3.2 % annual risk of stroke This patients CHA2DS2-VASc Score and unadjusted Ischemic Stroke Rate (% per year) is equal to 3.2 % stroke rate/year from a score of 3  -Overall rate control is much improved patient ambulated 2 laps around the ICU unit--heart rate high 80s to low 100 with ambulation--heart rate back down to the 70s to 80s at rest -Okay to discharge on Cardizem CD 120 mg daily as well as Toprol-XL 25 mg daily with outpatient follow-up with cardiologist -Patient remains in atrial flutter however rate controlled much better -Eliquis for stroke prophylaxis upon discharge as advised    2)HTN--stable, stopped losartan to give room to be able to titrate metoprolol and Cardizem  -- Discharged on Toprol-XL 25 mg daily and Cardizem CD 120 mg daily   3) ocular myasthenia gravis--- continue Mestinon   4) polyneuropathy----continue gabapentin   5)Sz disorder--- continue Keppra   6) mild LFT elevation--- AST is 84 >>79 and  ALT is 81 >76, T bili 0.7 - alk phos 62>>56 -Okay to continue simvastatin,     7) moderate aortic stenosis--- noted on echo from 10/10/2020 outpatient follow-up with cardiology for ongoing monitoring advised -Aortic valve mean gradient  measures 10.0 mmHg. Aortic valve peak gradient measures 16.5 mmHg. Aortic  valve area, by VTI measures 1.39 cm. -Patient follow-up with cardiologist advised    Disposition--Home with family Dispo: The patient is from: Home              Anticipated d/c is to: Home              Code Status :  -  Code Status: Full Code    Family  Communication:    (patient is alert, awake and coherent) Discussed with wife at bedside   Discharge Condition: stable  Follow UP   Follow-up Information     Branch, Alphonse Guild, MD. Call in 1 week(s).   Specialty: Cardiology Contact information: [address] [place] [address] [phone]         Redmond School, MD. Call.   Specialty: Internal Medicine Contact information: [address] [place] [address] [phone]                  Consults obtained - na  Diet and Activity recommendation:  As advised  Discharge Instructions   Discharge Instructions     Call MD for:  persistant dizziness or light-headedness   Complete by: As directed    Call MD for:  persistant nausea and vomiting   Complete by: As directed    Call MD for:  temperature >100.4   Complete by: As directed    Diet - low sodium heart healthy   Complete by: As directed    Discharge instructions   Complete by: As directed    1) you are taking Eliquis/apixaban which is a blood thinner--- please Avoid ibuprofen/Advil/Aleve/Motrin/Goody Powders/Naproxen/BC powders/Meloxicam/Diclofenac/Indomethacin and other Nonsteroidal anti-inflammatory medications as these will make you more likely to bleed and can cause stomach ulcers, can also cause Kidney problems.   2)follow up with cardiologist Dr. Carlyle Dolly in 1 to 2 weeks for recheck and reevaluation-- Address: inside [facility], [place], [place], [place] [address] Phone: [phone]   Increase activity slowly   Complete by: As directed          Discharge Medications     Allergies as of 10/14/2020       Reactions   Iodinated Diagnostic Agents Hives    pt was premedicated/hives reaction occurred 24 hours post injection, Onset Date: [id number]   Sulfa Antibiotics Swelling, Rash   Bactericin [bacitracin] Other (See Comments)   Unknown reaction         Medication List     STOP taking these medications    diclofenac 75 MG EC tablet Commonly known as: VOLTAREN   losartan 25 MG tablet Commonly known as: COZAAR       TAKE these medications    acetaminophen 325 MG tablet Commonly known as: TYLENOL Take 2 tablets (650 mg total) by mouth every 6 (six) hours as needed for mild pain (or Fever >/= 101).   apixaban 5 MG Tabs tablet Commonly known as: ELIQUIS Take 1 tablet (5 mg total) by mouth 2 (two) times daily.   diltiazem 120 MG 24 hr capsule Commonly known as: CARDIZEM CD Take 1 capsule (120 mg total) by mouth daily. Start taking on: October 15, 2020   fish oil-omega-3 fatty acids 1000 MG capsule Take 1 g by mouth 3 (three) times daily.   GARLIQUE PO Take 1 tablet by mouth daily.   MAGNESIUM CITRATE PO Take 1 tablet by mouth daily.   metoprolol succinate 25 MG 24 hr tablet Commonly known as: TOPROL-XL Take 1 tablet (25 mg total) by mouth daily. Start taking on: October 15, 2020   niacinamide 500 MG tablet Take 500 mg by mouth 3 (three) times daily.   Polyethyl Glycol-Propyl Glycol 0.4-0.3 % Soln Place 1 drop into both eyes 3 (three) times daily as needed (for dry eyes).   simvastatin 20 MG tablet Commonly known as: ZOCOR Take 20 mg by mouth at bedtime.   tiZANidine 4  MG tablet Commonly known as: ZANAFLEX Take 4 mg by mouth at bedtime as needed for muscle spasms.       ASK your doctor about these medications    gabapentin 600 MG tablet Commonly known as: NEURONTIN TAKE 1 TABLET BY  MOUTH  DAILY   levETIRAcetam 500 MG tablet Commonly known as: KEPPRA Take 1/2 tablet in the morning, take 1 tablet in the evening   pyridostigmine 60 MG tablet Commonly known as: MESTINON TAKE 1 AND 1/2 TABLETS BY  MOUTH 4 TIMES DAILY        Major procedures and Radiology Reports - PLEASE review detailed and final reports for all details, in brief -  DG Chest Portable 1 View  Result Date: 10/10/2020 CLINICAL DATA:  86 year old male with weakness and shortness of breath. Tachycardia. EXAM: PORTABLE CHEST 1 VIEW COMPARISON:  Chest radiographs 05/24/2015 and earlier. FINDINGS: Portable AP semi upright view at 0914 hours. Lung volumes and mediastinal contours are stable and within normal limits. Visualized tracheal air column is within normal limits. Chronic blunting of the right lateral costophrenic angle is stable. Allowing for portable technique the lungs are clear. No pneumothorax. No acute osseous abnormality identified. IMPRESSION: Negative portable chest. Electronically Signed   By: Genevie Ann M.D.   On: 10/10/2020 09:27   ECHOCARDIOGRAM COMPLETE  Result Date: 10/10/2020    ECHOCARDIOGRAM REPORT   Patient Name:   Ebb Carelock. Date of Exam: 10/10/2020 Medical Rec #:  [id number]           Height:       68.0 in Accession #:    [id number]          Weight:       154.0 lb Date of Birth:  09-18-1934            BSA:          1.829 m Patient Age:    55 years            BP:           99/70 mmHg Patient Gender: M                   HR:           90 bpm. Exam Location:  [facility] Procedure: 2D Echo, Cardiac Doppler and Color Doppler Indications:    Dyspnea  History:        Patient has prior history of Echocardiogram examinations, most                 recent 10/25/2014. Arrythmias:Atrial Fibrillation; Risk                 Factors:Hypertension and Diabetes.  Sonographer:    Wenda Low Referring Phys: [place]  1. Left ventricular ejection fraction, by estimation, is 65 to  70%. The left ventricle has normal function. The left ventricle has no regional wall motion abnormalities. There is severe left ventricular hypertrophy. Left ventricular diastolic parameters  are indeterminate in the setting of atrial fibrillation/flutter.  2. Right ventricular systolic function is normal. The right ventricular size is normal. There is normal pulmonary artery systolic pressure. The estimated right ventricular systolic pressure is 16.0 mmHg.  3. Left atrial size was moderately dilated.  4. The mitral valve is grossly normal. Trivial mitral valve regurgitation.  5. The aortic valve is functionally bicuspid. There is moderate calcification of the aortic valve. Aortic valve regurgitation is trivial. Moderate aortic valve stenosis.  Aortic valve mean gradient measures 10.0 mmHg. Dimentionless index 0.44.  6. The inferior vena cava is dilated in size with >50% respiratory variability, suggesting right atrial pressure of 8 mmHg. FINDINGS  Left Ventricle: Left ventricular ejection fraction, by estimation, is 65 to 70%. The left ventricle has normal function. The left ventricle has no regional wall motion abnormalities. The left ventricular internal cavity size was normal in size. There is  severe left ventricular hypertrophy. Left ventricular diastolic parameters are indeterminate. Right Ventricle: The right ventricular size is normal. No increase in right ventricular wall thickness. Right ventricular systolic function is normal. There is normal pulmonary artery systolic pressure. The tricuspid regurgitant velocity is 2.28 m/s, and  with an assumed right atrial pressure of 8 mmHg, the estimated right ventricular systolic pressure is 93.9 mmHg. Left Atrium: Left atrial size was moderately dilated. Right Atrium: Right atrial size was normal in size. Pericardium: There is no evidence of pericardial effusion. Mitral Valve: The mitral valve is grossly normal. Trivial mitral valve regurgitation. MV peak gradient,  4.4 mmHg. The mean mitral valve gradient is 1.0 mmHg. Tricuspid Valve: The tricuspid valve is grossly normal. Tricuspid valve regurgitation is mild. Aortic Valve: The aortic valve is bicuspid. There is moderate calcification of the aortic valve. There is mild aortic valve annular calcification. Aortic valve regurgitation is trivial. Moderate aortic stenosis is present. Aortic valve mean gradient measures 10.0 mmHg. Aortic valve peak gradient measures 16.5 mmHg. Aortic valve area, by VTI measures 1.39 cm. Pulmonic Valve: The pulmonic valve was grossly normal. Pulmonic valve regurgitation is trivial. Aorta: The aortic root is normal in size and structure. Venous: The inferior vena cava is dilated in size with greater than 50% respiratory variability, suggesting right atrial pressure of 8 mmHg. IAS/Shunts: No atrial level shunt detected by color flow Doppler.  LEFT VENTRICLE PLAX 2D LVIDd:         3.80 cm  Diastology LVIDs:         2.53 cm  LV e' medial:    8.06 cm/s LV PW:         1.58 cm  LV E/e' medial:  11.2 LV IVS:        1.66 cm  LV e' lateral:   15.30 cm/s LVOT diam:     2.00 cm  LV E/e' lateral: 5.9 LV SV:         58 LV SV Index:   32 LVOT Area:     3.14 cm  RIGHT VENTRICLE RV Basal diam:  3.64 cm RV Mid diam:    3.54 cm TAPSE (M-mode): 2.0 cm LEFT ATRIUM              Index       RIGHT ATRIUM           Index LA diam:        4.50 cm  2.46 cm/m  RA Area:     16.50 cm LA Vol (A2C):   103.0 ml 56.32 ml/m RA Volume:   44.70 ml  24.44 ml/m LA Vol (A4C):   82.0 ml  44.84 ml/m LA Biplane Vol: 94.0 ml  51.40 ml/m  AORTIC VALVE AV Area (Vmax):    1.39 cm AV Area (Vmean):   1.22 cm AV Area (VTI):     1.39 cm AV Vmax:           203.00 cm/s AV Vmean:          151.000 cm/s AV VTI:  0.417 m AV Peak Grad:      16.5 mmHg AV Mean Grad:      10.0 mmHg LVOT Vmax:         90.00 cm/s LVOT Vmean:        58.800 cm/s LVOT VTI:          0.184 m LVOT/AV VTI ratio: 0.44  AORTA Ao Root diam: 3.40 cm Ao Asc diam:  3.60  cm MITRAL VALVE               TRICUSPID VALVE MV Area (PHT): 4.65 cm    TR Peak grad:   20.8 mmHg MV Area VTI:   2.78 cm    TR Vmax:        228.00 cm/s MV Peak grad:  4.4 mmHg MV Mean grad:  1.0 mmHg    SHUNTS MV Vmax:       1.05 m/s    Systemic VTI:  0.18 m MV Vmean:      43.3 cm/s   Systemic Diam: 2.00 cm MV Decel Time: 163 msec MV E velocity: 90.20 cm/s Rozann Lesches MD Electronically signed by Rozann Lesches MD Signature Date/Time: 10/10/2020/3:44:51 PM    Final     Micro Results   Recent Results (from the past 240 hour(s))  Resp Panel by RT-PCR (Flu A&B, Covid) Nasopharyngeal Swab     Status: None   Collection Time: 10/10/20  9:38 AM   Specimen: Nasopharyngeal Swab; Nasopharyngeal(NP) swabs in vial transport medium  Result Value Ref Range Status   SARS Coronavirus 2 by RT PCR NEGATIVE NEGATIVE Final    Comment: (NOTE) SARS-CoV-2 target nucleic acids are NOT DETECTED.  The SARS-CoV-2 RNA is generally detectable in upper respiratory specimens during the acute phase of infection. The lowest concentration of SARS-CoV-2 viral copies this assay can detect is 138 copies/mL. A negative result does not preclude SARS-Cov-2 infection and should not be used as the sole basis for treatment or other patient management decisions. A negative result may occur with  improper specimen collection/handling, submission of specimen other than nasopharyngeal swab, presence of viral mutation(s) within the areas targeted by this assay, and inadequate number of viral copies(<138 copies/mL). A negative result must be combined with clinical observations, patient history, and epidemiological information. The expected result is Negative.  Fact Sheet for Patients:  EntrepreneurPulse.com.au  Fact Sheet for Healthcare Providers:  IncredibleEmployment.be  This test is no t yet approved or cleared by the [place] FDA and  has been authorized for detection and/or  diagnosis of SARS-CoV-2 by FDA under an Emergency Use Authorization (EUA). This EUA will remain  in effect (meaning this test can be used) for the duration of the COVID-19 declaration under Section 564(b)(1) of the Act, 21 U.S.C.section 360bbb-3(b)(1), unless the authorization is terminated  or revoked sooner.       Influenza A by PCR NEGATIVE NEGATIVE Final   Influenza B by PCR NEGATIVE NEGATIVE Final    Comment: (NOTE) The Xpert Xpress SARS-CoV-2/FLU/RSV plus assay is intended as an aid in the diagnosis of influenza from Nasopharyngeal swab specimens and should not be used as a sole basis for treatment. Nasal washings and aspirates are unacceptable for Xpert Xpress SARS-CoV-2/FLU/RSV testing.  Fact Sheet for Patients: EntrepreneurPulse.com.au  Fact Sheet for Healthcare Providers: IncredibleEmployment.be  This test is not yet approved or cleared by the [place] FDA and has been authorized for detection and/or diagnosis of SARS-CoV-2 by FDA under an Emergency Use Authorization (EUA). This EUA will remain  in effect (meaning this test can be used) for the duration of the COVID-19 declaration under Section 564(b)(1) of the Act, 21 U.S.C. section 360bbb-3(b)(1), unless the authorization is terminated or revoked.  Performed at [facility], [address] [place], [place] [address]   MRSA Next Gen by PCR, Nasal     Status: None   Collection Time: 10/10/20  3:14 PM   Specimen: Nasal Mucosa; Nasal Swab  Result Value Ref Range Status   MRSA by PCR Next Gen NOT DETECTED NOT DETECTED Final    Comment: (NOTE) The GeneXpert MRSA Assay (FDA approved for NASAL specimens only), is one component of a comprehensive MRSA colonization surveillance program. It is not intended to diagnose MRSA infection nor to guide or monitor treatment for MRSA infections. Test performance is not FDA approved in patients less than 77 years old. Performed at [facility], [address], [place], [place] [address]        Today   Subjective    Kyle [name] today has no new concerns  No fever  Or chills  -Wife and male relative at bedside        - -Overall rate control is much improved patient ambulated 2 laps around the ICU unit--heart rate high 80s to low 100 with ambulation--heart rate back down to the 70s to 80s at rest -Okay to discharge on Cardizem CD 120 mg daily as well as Toprol-XL 25 mg daily with outpatient follow-up with cardiologist -Patient remains in atrial flutter however rate controlled much better -Eliquis for stroke prophylaxis upon discharge as advised   Patient has been seen and examined prior to discharge   Objective   Blood pressure (!) 107/58, pulse 70, temperature 98 F (36.7 C), temperature source Oral, resp. rate 16, height 5' 8"  (1.727 m), weight 71 kg, SpO2 98 %.   Intake/Output Summary (Last 24 hours) at 10/14/2020 1246 Last data filed at 10/13/2020 1800 Gross per 24 hour  Intake 480 ml  Output 1525 ml  Net -1045 ml    Exam Gen:- Awake Alert,  in no apparent distress HEENT:- .AT, No sclera icterus Neck-Supple Neck,No JVD,. Lungs-  CTAB , fair symmetrical air movement CV- S1, S2 normal, irregularly irregular,  Abd-  +ve B.Sounds, Abd Soft, No tenderness,    Extremity/Skin:- No  edema, pedal pulses present Psych-affect is appropriate, oriented x3 Neuro- no new focal deficits, no tremors   Data Review   CBC w Diff:  Lab Results  Component Value Date   WBC 4.6 10/14/2020   HGB 13.0 10/14/2020   HCT 37.8 (L) 10/14/2020   PLT 107 (L) 10/14/2020   LYMPHOPCT 9 10/10/2020   MONOPCT 8 10/10/2020   EOSPCT 0 10/10/2020   BASOPCT 0 10/10/2020    CMP:  Lab Results  Component Value Date   NA 129 (L) 10/14/2020   K 3.5 10/14/2020   CL 97 (L) 10/14/2020   CO2 25 10/14/2020   BUN 19 10/14/2020   CREATININE 0.86 10/14/2020   PROT 6.2 (L) 10/14/2020   PROT 6.9 11/23/2018   ALBUMIN 3.0 (L) 10/14/2020    BILITOT 1.0 10/14/2020   ALKPHOS 73 10/14/2020   AST 128 (H) 10/14/2020   ALT 117 (H) 10/14/2020  .   Total Discharge time is about 33 minutes  Roxan Hockey M.D on 10/14/2020 at 12:46 PM  Go to [email or web address] -  for contact info  [facility] - Office  [phone]

## 2020-10-14 NOTE — Progress Notes (Signed)
Reviewed with pt and family discharge instructions, aware of the new medications, IV removed, external cath removed. VS wnl at the time of d/c, no distress, pain or SOB noted. Family with pt's belongings.

## 2020-10-14 NOTE — Discharge Instructions (Signed)
1) you are taking Eliquis/apixaban which is a blood thinner--- please Avoid ibuprofen/Advil/Aleve/Motrin/Goody Powders/Naproxen/BC powders/Meloxicam/Diclofenac/Indomethacin and other Nonsteroidal anti-inflammatory medications as these will make you more likely to bleed and can cause stomach ulcers, can also cause Kidney problems.   2)follow up with cardiologist Dr. Carlyle Dolly in 1 to 2 weeks for recheck and reevaluation-- Address: inside [facility], [place], [place], [place] [address] Phone: [phone]

## 2020-10-18 DIAGNOSIS — I4891 Unspecified atrial fibrillation: Secondary | ICD-10-CM | POA: Diagnosis not present

## 2020-10-18 DIAGNOSIS — N183 Chronic kidney disease, stage 3 unspecified: Secondary | ICD-10-CM | POA: Diagnosis not present

## 2020-10-18 DIAGNOSIS — I350 Nonrheumatic aortic (valve) stenosis: Secondary | ICD-10-CM | POA: Diagnosis not present

## 2020-10-18 DIAGNOSIS — Z6822 Body mass index (BMI) 22.0-22.9, adult: Secondary | ICD-10-CM | POA: Diagnosis not present

## 2020-10-21 ENCOUNTER — Encounter: Payer: Self-pay | Admitting: *Deleted

## 2020-10-21 ENCOUNTER — Encounter: Payer: Self-pay | Admitting: Physician Assistant

## 2020-10-21 ENCOUNTER — Ambulatory Visit: Payer: Medicare Other | Admitting: Physician Assistant

## 2020-10-21 ENCOUNTER — Other Ambulatory Visit: Payer: Self-pay

## 2020-10-21 VITALS — BP 110/72 | HR 106 | Ht 68.0 in | Wt 155.0 lb

## 2020-10-21 DIAGNOSIS — I10 Essential (primary) hypertension: Secondary | ICD-10-CM | POA: Diagnosis not present

## 2020-10-21 DIAGNOSIS — I4891 Unspecified atrial fibrillation: Secondary | ICD-10-CM

## 2020-10-21 DIAGNOSIS — I350 Nonrheumatic aortic (valve) stenosis: Secondary | ICD-10-CM

## 2020-10-21 NOTE — Progress Notes (Signed)
Cardiology Office Note    Date:  10/21/2020   ID:  Kyle Schilling., DOB [birth year], MRN [id number]   PCP:  No primary care provider on file.   [place]  Cardiologist:  Carlyle Dolly, MD   Advanced Practice Provider:  No care team member to display Electrophysiologist:  None   [phone]   No chief complaint on file.   History of Present Illness:  Kyle Orourke. is a 86 y.o. male with history of hypertension, chest pain in 2016 with normal NST.  Patient also has history of multinodular goiter history of meningioma status post prior resection, ocular myasthenia gravis, history of seizures.  Patient admitted to the hospital with new onset atrial fibrillation/flutter with RVR he was slowed with IV diltiazem but had to be changed to oral because of low blood pressures.  2D echo LVEF 65 to 70% with severe LVH moderate left atrial enlargement ChadVasc is 3 so started on Eliquis.  He remained in atrial flutter better controlled and is here for follow-up.  Echo also noted moderate aortic stenosis peak gradient 16.5 mmHg AVA 1.39 cm.  Patient comes in for f/u. Says he doesn't have any energy. 2 weeks ago before all this started he was feeling fine and very active-mowing 4 yards, gardening. Feels like he aged 89 yrs overnight. Heart rate running in 70-80's but jumped up to 122 when he walked to garden and mailbox.  TSH was normal. Had some hematuria and is being treated for a UTI.     Past Medical History:  Diagnosis Date   Arthritis    Auto immune neutropenia (HCC)    Bullous pemphigoid    Cancer (HCC)    Prostate   Cervical spondylosis without myelopathy 01/22/2016   DVT (deep venous thrombosis) ([place])    a. Has had a previous right lower extremity DVT in the setting of hospitalization and surgery (after his brain tumor was resected in 1993) but is no longer on Coumadin.   Dyslipidemia    History of prostate cancer    Hypertension    Meningioma  ([place])    a. s/p resection 1990s.   Myasthenia ([place]) 02/14/2013   Nocturnal leg cramps    Ocular myasthenia gravis ([place])    Peripheral neuropathy 11/23/2018   Seizures ([place])    Sinus bradycardia    Sleep paralysis    Thyroid nodule, cold     Past Surgical History:  Procedure Laterality Date   BRAIN SURGERY     CHOLECYSTECTOMY     COLONOSCOPY N/A 07/25/2013   Procedure: COLONOSCOPY;  Surgeon: Jamesetta So, MD;  [facility];  Service: Gastroenterology;  Laterality: N/A;   COLONOSCOPY N/A 11/10/2016   Procedure: COLONOSCOPY;  Surgeon: Aviva Signs, MD;  Location: [facility];  Service: Gastroenterology;  Laterality: N/A;   IR CATHETER TUBE CHANGE  01/02/2017   IR RADIOLOGIST EVAL & MGMT  01/06/2017   IR RADIOLOGIST EVAL & MGMT  01/20/2017   IR RADIOLOGIST EVAL & MGMT  02/03/2017   IR RADIOLOGIST EVAL & MGMT  02/17/2017   IR SINUS/FIST TUBE CHK-NON GI  03/03/2017   RADIOACTIVE SEED IMPLANT      Current Medications: Current Meds  Medication Sig   acetaminophen (TYLENOL) 325 MG tablet Take 2 tablets (650 mg total) by mouth every 6 (six) hours as needed for mild pain (or Fever >/= 101).   apixaban (ELIQUIS) 5 MG TABS tablet Take 1 tablet (5 mg total)  by mouth 2 (two) times daily.   diltiazem (CARDIZEM CD) 120 MG 24 hr capsule Take 1 capsule (120 mg total) by mouth daily.   fish oil-omega-3 fatty acids 1000 MG capsule Take 1 g by mouth 3 (three) times daily.   gabapentin (NEURONTIN) 600 MG tablet TAKE 1 TABLET BY MOUTH  DAILY (Patient taking differently: Take 600 mg by mouth daily.)   Garlic (GARLIQUE PO) Take 1 tablet by mouth daily.   levETIRAcetam (KEPPRA) 500 MG tablet Take 1/2 tablet in the morning, take 1 tablet in the evening (Patient taking differently: Take 250-500 mg by mouth 2 (two) times daily. Take 1/2 tablet in the morning, take 1 tablet in the evening)   MAGNESIUM CITRATE PO Take 1 tablet by mouth daily.   metoprolol succinate (TOPROL-XL) 25 MG 24 hr tablet  Take 1 tablet (25 mg total) by mouth daily.   niacinamide 500 MG tablet Take 500 mg by mouth 3 (three) times daily.   Polyethyl Glycol-Propyl Glycol 0.4-0.3 % SOLN Place 1 drop into both eyes 3 (three) times daily as needed (for dry eyes).   pyridostigmine (MESTINON) 60 MG tablet TAKE 1 AND 1/2 TABLETS BY  MOUTH 4 TIMES DAILY (Patient taking differently: Take 90 mg by mouth 4 (four) times daily.)   simvastatin (ZOCOR) 20 MG tablet Take 20 mg by mouth at bedtime.    tiZANidine (ZANAFLEX) 4 MG tablet Take 4 mg by mouth at bedtime as needed for muscle spasms.     Allergies:   Iodinated diagnostic agents, Sulfa antibiotics, and Bactericin [bacitracin]   Social History   Socioeconomic History   Marital status: Married    Spouse name: Not on file   Number of children: 1   Years of education: 79 TH   Highest education level: Not on file  Occupational History   Occupation: retired  Tobacco Use   Smoking status: Never   Smokeless tobacco: Never  Substance and Sexual Activity   Alcohol use: No   Drug use: No   Sexual activity: Not on file  Other Topics Concern   Not on file  Social History Narrative   Patient is right handed.   Patient drinks 1 cup of coffee daily.   Social Determinants of Health   Financial Resource Strain: Not on file  Food Insecurity: Not on file  Transportation Needs: Not on file  Physical Activity: Not on file  Stress: Not on file  Social Connections: Not on file     Family History:  The patient's  family history includes Dementia in his sister; Diabetes in his mother and sister; Heart failure in his daughter and mother; Lung cancer in his son.   ROS:   Please see the history of present illness.    ROS All other systems reviewed and are negative.   PHYSICAL EXAM:   VS:  BP 110/72   Pulse (!) 106   Ht 5\' 8"  (1.727 m)   Wt 155 lb (70.3 kg)   SpO2 98%   BMI 23.57 kg/m   Physical Exam  GEN: Thin, in no acute distress  Neck: no JVD, carotid bruits,  or masses Cardiac:RRR; no murmurs, rubs, or gallops  Respiratory:  clear to auscultation bilaterally, normal work of breathing GI: soft, nontender, nondistended, + BS Ext: without cyanosis, clubbing, or edema, Good distal pulses bilaterally Neuro:  Alert and Oriented x 3 Psych: euthymic mood, full affect  Wt Readings from Last 3 Encounters:  10/21/20 155 lb (70.3 kg)  10/13/20 156  lb 8.4 oz (71 kg)  08/21/20 156 lb (70.8 kg)      Studies/Labs Reviewed:   EKG:  EKG is  ordered today.  The ekg ordered today demonstrates Atrial flutter 106/m nonspecific ST changes.  Recent Labs: 10/10/2020: TSH 2.438 10/14/2020: ALT 117; BUN 19; Creatinine, Ser 0.86; Hemoglobin 13.0; Magnesium 1.7; Platelets 107; Potassium 3.5; Sodium 129   Lipid Panel    Component Value Date/Time   CHOL 182 10/26/2014 0912   TRIG 130 10/26/2014 0912   HDL 67 10/26/2014 0912   CHOLHDL 2.7 10/26/2014 0912   VLDL 26 10/26/2014 0912   LDLCALC 89 10/26/2014 0912    Additional studies/ records that were reviewed today include:  Echo 10/10/20 IMPRESSIONS     1. Left ventricular ejection fraction, by estimation, is 65 to 70%. The  left ventricle has normal function. The left ventricle has no regional  wall motion abnormalities. There is severe left ventricular hypertrophy.  Left ventricular diastolic parameters   are indeterminate in the setting of atrial fibrillation/flutter.   2. Right ventricular systolic function is normal. The right ventricular  size is normal. There is normal pulmonary artery systolic pressure. The  estimated right ventricular systolic pressure is 73.4 mmHg.   3. Left atrial size was moderately dilated.   4. The mitral valve is grossly normal. Trivial mitral valve  regurgitation.   5. The aortic valve is functionally bicuspid. There is moderate  calcification of the aortic valve. Aortic valve regurgitation is trivial.  Moderate aortic valve stenosis. Aortic valve mean gradient measures  10.0  mmHg. Dimentionless index 0.44.   6. The inferior vena cava is dilated in size with >50% respiratory  variability, suggesting right atrial pressure of 8 mmHg.   FINDINGS   Left Ventricle: Left ventricular ejection fraction, by estimation, is 65  to 70%. The left ventricle has normal function. The left ventricle has no  regional wall motion abnormalities. The left ventricular internal cavity  size was normal in size. There is   severe left ventricular hypertrophy. Left ventricular diastolic  parameters are indeterminate.   Right Ventricle: The right ventricular size is normal. No increase in  right ventricular wall thickness. Right ventricular systolic function is  normal. There is normal pulmonary artery systolic pressure. The tricuspid  regurgitant velocity is 2.28 m/s, and   with an assumed right atrial pressure of 8 mmHg, the estimated right  ventricular systolic pressure is 19.3 mmHg.   Left Atrium: Left atrial size was moderately dilated.   Right Atrium: Right atrial size was normal in size.   Pericardium: There is no evidence of pericardial effusion.   Mitral Valve: The mitral valve is grossly normal. Trivial mitral valve  regurgitation. MV peak gradient, 4.4 mmHg. The mean mitral valve gradient  is 1.0 mmHg.   Tricuspid Valve: The tricuspid valve is grossly normal. Tricuspid valve  regurgitation is mild.   Aortic Valve: The aortic valve is bicuspid. There is moderate  calcification of the aortic valve. There is mild aortic valve annular  calcification. Aortic valve regurgitation is trivial. Moderate aortic  stenosis is present. Aortic valve mean gradient  measures 10.0 mmHg. Aortic valve peak gradient measures 16.5 mmHg. Aortic  valve area, by VTI measures 1.39 cm.   Pulmonic Valve: The pulmonic valve was grossly normal. Pulmonic valve  regurgitation is trivial.   Aorta: The aortic root is normal in size and structure.   Venous: The inferior vena cava is  dilated in size with greater than 50%  respiratory variability, suggesting right atrial pressure of 8 mmHg.   IAS/Shunts: No atrial level shunt detected by color flow Doppler.     LEFT VENTRICLE  PLAX 2D  LVIDd:         3.80 cm  Diastology  LVIDs:         2.53 cm  LV e' medial:    8.06 cm/s  LV PW:         1.58 cm  LV E/e' medial:  11.2  LV IVS:        1.66 cm  LV e' lateral:   15.30 cm/s  LVOT diam:     2.00 cm  LV E/e' lateral: 5.9  LV SV:         58  LV SV Index:   32  LVOT Area:     3.14 cm     RIGHT VENTRICLE  RV Basal diam:  3.64 cm  RV Mid diam:    3.54 cm  TAPSE (M-mode): 2.0 cm   LEFT ATRIUM              Index       RIGHT ATRIUM           Index  LA diam:        4.50 cm  2.46 cm/m  RA Area:     16.50 cm  LA Vol (A2C):   103.0 ml 56.32 ml/m RA Volume:   44.70 ml  24.44 ml/m  LA Vol (A4C):   82.0 ml  44.84 ml/m  LA Biplane Vol: 94.0 ml  51.40 ml/m   AORTIC VALVE  AV Area (Vmax):    1.39 cm  AV Area (Vmean):   1.22 cm  AV Area (VTI):     1.39 cm  AV Vmax:           203.00 cm/s  AV Vmean:          151.000 cm/s  AV VTI:            0.417 m  AV Peak Grad:      16.5 mmHg  AV Mean Grad:      10.0 mmHg  LVOT Vmax:         90.00 cm/s  LVOT Vmean:        58.800 cm/s  LVOT VTI:          0.184 m  LVOT/AV VTI ratio: 0.44    AORTA  Ao Root diam: 3.40 cm  Ao Asc diam:  3.60 cm   MITRAL VALVE               TRICUSPID VALVE  MV Area (PHT): 4.65 cm    TR Peak grad:   20.8 mmHg  MV Area VTI:   2.78 cm    TR Vmax:        228.00 cm/s  MV Peak grad:  4.4 mmHg  MV Mean grad:  1.0 mmHg    SHUNTS  MV Vmax:       1.05 m/s    Systemic VTI:  0.18 m  MV Vmean:      43.3 cm/s   Systemic Diam: 2.00 cm  MV Decel Time: 163 msec  MV E velocity: 90.20 cm/s   Rozann Lesches MD  Electronically signed by Rozann Lesches MD  Signature Date/Time: 10/10/2020/3:44:51 PM        Final      NST 2016 Defect 1: There is a small, fixed defect of mild severity present in  the basal  inferolateral and mid inferolateral location. Likely due to artifact, but scar cannot entirely be excluded. This is a low risk study. The left ventricular ejection fraction is normal (55-65%). Nuclear stress EF: 60%.     Risk Assessment/Calculations:    CHA2DS2-VASc Score = 3  This indicates a 3.2% annual risk of stroke. The patient's score is based upon: CHF History: No HTN History: Yes Diabetes History: No Stroke History: No Vascular Disease History: No Age Score: 2 Gender Score: 0       ASSESSMENT:    1. New onset a-fib/Flutter    2. Aortic stenosis, moderate/Aortic valve mean gradient is 10.0 mmHg   3. Essential hypertension, benign      PLAN:  In order of problems listed above:  Atrial flutter on diltiazem and metoprolol and Eliquis. Feels terrible with fatigue since in Aflutter. HR overall controlled but goes up with activity. Echo with normal LVEF, severe LVH, mod AS, discussed with Dr. Harl Bowie who agrees we should try DCCV. Also offered TEE but patient thinks he can wait for 3 weeks of Eliquis. Hasn't missed a dose.  Rate reasonably well controlled. Has had trouble with anesthesia before -bladder and colon surgery in the past and was told he woke up violent.  Moderate Aortic Stenosis AVA 10 mmHG-will continue to moitor.  HTN with severe LVH on echo-controlled on diltiazem and metoprolol (was on losartan 25 mg daily but stopped when placed on above meds in the hospital).   History of mulinodular goiter-TSH normal 10/10/20 .  Shared Decision Making/Informed Consent   Shared Decision Making/Informed Consent The risks (stroke, cardiac arrhythmias rarely resulting in the need for a temporary or permanent pacemaker, skin irritation or burns and complications associated with conscious sedation including aspiration, arrhythmia, respiratory failure and death), benefits (restoration of normal sinus rhythm) and alternatives of a direct current cardioversion were explained in  detail to Kyle [name] and he agrees to proceed.      Medication Adjustments/Labs and Tests Ordered: Current medicines are reviewed at length with the patient today.  Concerns regarding medicines are outlined above.  Medication changes, Labs and Tests ordered today are listed in the Patient Instructions below. Patient Instructions  Medication Instructions:  Your physician recommends that you continue on your current medications as directed. Please refer to the Current Medication list given to you today.  *If you need a refill on your cardiac medications before your next appointment, please call your pharmacy*   Lab Work: Your physician recommends that you return for lab work during pre op.   If you have labs (blood work) drawn today and your tests are completely normal, you will receive your results only by: [place] (if you have MyChart) OR A paper copy in the mail If you have any lab test that is abnormal or we need to change your treatment, we will call you to review the results.   Testing/Procedures: Your physician has recommended that you have a Cardioversion (DCCV). Electrical Cardioversion uses a jolt of electricity to your heart either through paddles or wired patches attached to your chest. This is a controlled, usually prescheduled, procedure. Defibrillation is done under light anesthesia in the hospital, and you usually go home the day of the procedure. This is done to get your heart back into a normal rhythm. You are not awake for the procedure. Please see the instruction sheet given to you today.    Follow-Up: At [facility], you and your health needs are our priority.  As part of our continuing mission to provide you with exceptional heart care, we have created designated Provider Care Teams.  These Care Teams include your primary Cardiologist (physician) and Advanced Practice Providers (APPs -  Physician Assistants and Nurse Practitioners) who all work together to  provide you with the care you need, when you need it.  We recommend signing up for the patient portal called "MyChart".  Sign up information is provided on this After Visit Summary.  MyChart is used to connect with patients for Virtual Visits (Telemedicine).  Patients are able to view lab/test results, encounter notes, upcoming appointments, etc.  Non-urgent messages can be sent to your provider as well.   To learn more about what you can do with MyChart, go to [email or web address].    Your next appointment:    After Cardioversion   The format for your next appointment:   In Person  Provider:   Ermalinda Barrios, PA-C    Other Instructions Thank you for choosing [facility]!     Signed, Ermalinda Barrios, PA-C  10/21/2020 2:18 PM    [facility] [address], [place], [place]  [address] Phone: [phone]; Fax: [phone]

## 2020-10-21 NOTE — Patient Instructions (Signed)
Medication Instructions:  Your physician recommends that you continue on your current medications as directed. Please refer to the Current Medication list given to you today.  *If you need a refill on your cardiac medications before your next appointment, please call your pharmacy*   Lab Work: Your physician recommends that you return for lab work during pre op.   If you have labs (blood work) drawn today and your tests are completely normal, you will receive your results only by: [place] (if you have MyChart) OR A paper copy in the mail If you have any lab test that is abnormal or we need to change your treatment, we will call you to review the results.   Testing/Procedures: Your physician has recommended that you have a Cardioversion (DCCV). Electrical Cardioversion uses a jolt of electricity to your heart either through paddles or wired patches attached to your chest. This is a controlled, usually prescheduled, procedure. Defibrillation is done under light anesthesia in the hospital, and you usually go home the day of the procedure. This is done to get your heart back into a normal rhythm. You are not awake for the procedure. Please see the instruction sheet given to you today.    Follow-Up: At [facility], you and your health needs are our priority.  As part of our continuing mission to provide you with exceptional heart care, we have created designated Provider Care Teams.  These Care Teams include your primary Cardiologist (physician) and Advanced Practice Providers (APPs -  Physician Assistants and Nurse Practitioners) who all work together to provide you with the care you need, when you need it.  We recommend signing up for the patient portal called "MyChart".  Sign up information is provided on this After Visit Summary.  MyChart is used to connect with patients for Virtual Visits (Telemedicine).  Patients are able to view lab/test results, encounter notes, upcoming  appointments, etc.  Non-urgent messages can be sent to your provider as well.   To learn more about what you can do with MyChart, go to [email or web address].    Your next appointment:    After Cardioversion   The format for your next appointment:   In Person  Provider:   Ermalinda Barrios, PA-C    Other Instructions Thank you for choosing [facility]!

## 2020-11-04 NOTE — Patient Instructions (Signed)
Kyle [name].  11/04/2020     '@[email or web address]'$ @   Your procedure is scheduled on  11/07/2020.   Report to [facility] at  0800  A.M.   Call this number if you have problems the morning of surgery:  [phone]   Remember:  Do not eat or drink after midnight.      Take these medicines the morning of surgery with A SIP OF WATER                  diltiazem, keppra, mestinon.  DO NOT miss any doses of your eliquis.     Do not wear jewelry, make-up or nail polish.  Do not wear lotions, powders, or perfumes, or deodorant.  Do not shave 48 hours prior to surgery.  Men may shave face and neck.  Do not bring valuables to the hospital.  [facility] is not responsible for any belongings or valuables.  Contacts, dentures or bridgework may not be worn into surgery.  Leave your suitcase in the car.  After surgery it may be brought to your room.  For patients admitted to the hospital, discharge time will be determined by your treatment team.  Patients discharged the day of surgery will not be allowed to drive home and must have someone with them for 24 hours.    Special instructions:     DO NOT smoke tobacco or vape for 24 hours before your procedure.  Please read over the following fact sheets that you were given. Coughing and Deep Breathing, Surgical Site Infection Prevention, Anesthesia Post-op Instructions, and Care and Recovery After Surgery      Electrical Cardioversion Electrical cardioversion is the delivery of a jolt of electricity to restore a normal rhythm to the heart. A rhythm that is too fast or is not regular keeps the heart from pumping well. In this procedure, sticky patches or metal paddlesare placed on the chest to deliver electricity to the heart from a device. This procedure may be done in an emergency if: There is low or no blood pressure as a result of the heart rhythm. Normal rhythm must be restored as fast as possible to protect the brain and  heart from further damage. It may save a life. This may also be a scheduled procedure for irregular or fast heart rhythms thatare not immediately life-threatening. Tell a health care provider about: Any allergies you have. All medicines you are taking, including vitamins, herbs, eye drops, creams, and over-the-counter medicines. Any problems you or family members have had with anesthetic medicines. Any blood disorders you have. Any surgeries you have had. Any medical conditions you have. Whether you are pregnant or may be pregnant. What are the risks? Generally, this is a safe procedure. However, problems may occur, including: Allergic reactions to medicines. A blood clot that breaks free and travels to other parts of your body. The possible return of an abnormal heart rhythm within hours or days after the procedure. Your heart stopping (cardiac arrest). This is rare. What happens before the procedure? Medicines Your health care provider may have you start taking: Blood-thinning medicines (anticoagulants) so your blood does not clot as easily. Medicines to help stabilize your heart rate and rhythm. Ask your health care provider about: Changing or stopping your regular medicines. This is especially important if you are taking diabetes medicines or blood thinners. Taking medicines such as aspirin and ibuprofen. These medicines can thin your blood. Do not take these  medicines unless your health care provider tells you to take them. Taking over-the-counter medicines, vitamins, herbs, and supplements. General instructions Follow instructions from your health care provider about eating or drinking restrictions. Plan to have someone take you home from the hospital or clinic. If you will be going home right after the procedure, plan to have someone with you for 24 hours. Ask your health care provider what steps will be taken to help prevent infection. These may include washing your skin with a  germ-killing soap. What happens during the procedure?  An IV will be inserted into one of your veins. Sticky patches (electrodes) or metal paddles may be placed on your chest. You will be given a medicine to help you relax (sedative). An electrical shock will be delivered. The procedure may vary among health care providers and hospitals. What can I expect after the procedure? Your blood pressure, heart rate, breathing rate, and blood oxygen level will be monitored until you leave the hospital or clinic. Your heart rhythm will be watched to make sure it does not change. You may have some redness on the skin where the shocks were given. Follow these instructions at home: Do not drive for 24 hours if you were given a sedative during your procedure. Take over-the-counter and prescription medicines only as told by your health care provider. Ask your health care provider how to check your pulse. Check it often. Rest for 48 hours after the procedure or as told by your health care provider. Avoid or limit your caffeine use as told by your health care provider. Keep all follow-up visits as told by your health care provider. This is important. Contact a health care provider if: You feel like your heart is beating too quickly or your pulse is not regular. You have a serious muscle cramp that does not go away. Get help right away if: You have discomfort in your chest. You are dizzy or you feel faint. You have trouble breathing or you are short of breath. Your speech is slurred. You have trouble moving an arm or leg on one side of your body. Your fingers or toes turn cold or blue. Summary Electrical cardioversion is the delivery of a jolt of electricity to restore a normal rhythm to the heart. This procedure may be done right away in an emergency or may be a scheduled procedure if the condition is not an emergency. Generally, this is a safe procedure. After the procedure, check your pulse often  as told by your health care provider. This information is not intended to replace advice given to you by your health care provider. Make sure you discuss any questions you have with your healthcare provider. Document Revised: 10/31/2018 Document Reviewed: 10/31/2018 Elsevier Patient Education  [place] After This sheet gives you information about how to care for yourself after your procedure. Your health care provider may also give you more specific instructions. If you have problems or questions, contact your health careprovider. What can I expect after the procedure? After the procedure, it is common to have: Tiredness. Forgetfulness about what happened after the procedure. Impaired judgment for important decisions. Nausea or vomiting. Some difficulty with balance. Follow these instructions at home: For the time period you were told by your health care provider:     Rest as needed. Do not participate in activities where you could fall or become injured. Do not drive or use machinery. Do not drink alcohol. Do not take sleeping  pills or medicines that cause drowsiness. Do not make important decisions or sign legal documents. Do not take care of children on your own. Eating and drinking Follow the diet that is recommended by your health care provider. Drink enough fluid to keep your urine pale yellow. If you vomit: Drink water, juice, or soup when you can drink without vomiting. Make sure you have little or no nausea before eating solid foods. General instructions Have a responsible adult stay with you for the time you are told. It is important to have someone help care for you until you are awake and alert. Take over-the-counter and prescription medicines only as told by your health care provider. If you have sleep apnea, surgery and certain medicines can increase your risk for breathing problems. Follow instructions from your health care  provider about wearing your sleep device: Anytime you are sleeping, including during daytime naps. While taking prescription pain medicines, sleeping medicines, or medicines that make you drowsy. Avoid smoking. Keep all follow-up visits as told by your health care provider. This is important. Contact a health care provider if: You keep feeling nauseous or you keep vomiting. You feel light-headed. You are still sleepy or having trouble with balance after 24 hours. You develop a rash. You have a fever. You have redness or swelling around the IV site. Get help right away if: You have trouble breathing. You have new-onset confusion at home. Summary For several hours after your procedure, you may feel tired. You may also be forgetful and have poor judgment. Have a responsible adult stay with you for the time you are told. It is important to have someone help care for you until you are awake and alert. Rest as told. Do not drive or operate machinery. Do not drink alcohol or take sleeping pills. Get help right away if you have trouble breathing, or if you suddenly become confused. This information is not intended to replace advice given to you by your health care provider. Make sure you discuss any questions you have with your healthcare provider. Document Revised: 12/14/2019 Document Reviewed: 03/02/2019 Elsevier Patient Education  2022 [employer].

## 2020-11-05 ENCOUNTER — Encounter (HOSPITAL_COMMUNITY): Payer: Self-pay

## 2020-11-05 ENCOUNTER — Encounter (HOSPITAL_COMMUNITY)
Admission: RE | Admit: 2020-11-05 | Discharge: 2020-11-05 | Disposition: A | Discharge status: Home / Self Care | Payer: Medicare Other | Source: Ambulatory Visit | Attending: Cardiology | Admitting: Cardiology

## 2020-11-05 ENCOUNTER — Other Ambulatory Visit: Payer: Self-pay

## 2020-11-05 DIAGNOSIS — Z01812 Encounter for preprocedural laboratory examination: Secondary | ICD-10-CM | POA: Diagnosis not present

## 2020-11-05 HISTORY — DX: Other complications of anesthesia, initial encounter: T88.59XA

## 2020-11-05 LAB — BASIC METABOLIC PANEL
Anion gap: 9 (ref 5–15)
BUN: 23 mg/dL (ref 8–23)
CO2: 25 mmol/L (ref 22–32)
Calcium: 9.6 mg/dL (ref 8.9–10.3)
Chloride: 102 mmol/L (ref 98–111)
Creatinine, Ser: 0.93 mg/dL (ref 0.61–1.24)
GFR, Estimated: >60 mL/min (ref >60)
Glucose, Bld: 112 mg/dL — ABNORMAL HIGH (ref 70–99)
Potassium: 4.2 mmol/L (ref 3.5–5.1)
Sodium: 136 mmol/L (ref 135–145)

## 2020-11-07 ENCOUNTER — Ambulatory Visit (HOSPITAL_COMMUNITY): Payer: Medicare Other | Admitting: Anesthesiology

## 2020-11-07 ENCOUNTER — Encounter (HOSPITAL_COMMUNITY)
Admission: RE | Disposition: A | Discharge status: Home / Self Care | Payer: Self-pay | Source: Home / Self Care | Attending: Cardiology

## 2020-11-07 ENCOUNTER — Encounter (HOSPITAL_COMMUNITY): Payer: Self-pay | Admitting: Cardiology

## 2020-11-07 ENCOUNTER — Ambulatory Visit (HOSPITAL_COMMUNITY)
Admission: RE | Admit: 2020-11-07 | Discharge: 2020-11-07 | Disposition: A | Discharge status: Home / Self Care | Payer: Medicare Other | Attending: Cardiology | Admitting: Cardiology

## 2020-11-07 ENCOUNTER — Other Ambulatory Visit: Payer: Self-pay

## 2020-11-07 DIAGNOSIS — I352 Nonrheumatic aortic (valve) stenosis with insufficiency: Secondary | ICD-10-CM | POA: Insufficient documentation

## 2020-11-07 DIAGNOSIS — Z833 Family history of diabetes mellitus: Secondary | ICD-10-CM | POA: Diagnosis not present

## 2020-11-07 DIAGNOSIS — Z881 Allergy status to other antibiotic agents status: Secondary | ICD-10-CM | POA: Insufficient documentation

## 2020-11-07 DIAGNOSIS — Z91041 Radiographic dye allergy status: Secondary | ICD-10-CM | POA: Insufficient documentation

## 2020-11-07 DIAGNOSIS — Z8249 Family history of ischemic heart disease and other diseases of the circulatory system: Secondary | ICD-10-CM | POA: Insufficient documentation

## 2020-11-07 DIAGNOSIS — G7000 Myasthenia gravis without (acute) exacerbation: Secondary | ICD-10-CM | POA: Diagnosis not present

## 2020-11-07 DIAGNOSIS — Z86718 Personal history of other venous thrombosis and embolism: Secondary | ICD-10-CM | POA: Diagnosis not present

## 2020-11-07 DIAGNOSIS — Z7901 Long term (current) use of anticoagulants: Secondary | ICD-10-CM | POA: Diagnosis not present

## 2020-11-07 DIAGNOSIS — Z801 Family history of malignant neoplasm of trachea, bronchus and lung: Secondary | ICD-10-CM | POA: Insufficient documentation

## 2020-11-07 DIAGNOSIS — I4892 Unspecified atrial flutter: Secondary | ICD-10-CM | POA: Insufficient documentation

## 2020-11-07 DIAGNOSIS — I4891 Unspecified atrial fibrillation: Secondary | ICD-10-CM | POA: Insufficient documentation

## 2020-11-07 DIAGNOSIS — Z79899 Other long term (current) drug therapy: Secondary | ICD-10-CM | POA: Insufficient documentation

## 2020-11-07 DIAGNOSIS — I119 Hypertensive heart disease without heart failure: Secondary | ICD-10-CM | POA: Insufficient documentation

## 2020-11-07 DIAGNOSIS — I350 Nonrheumatic aortic (valve) stenosis: Secondary | ICD-10-CM | POA: Diagnosis not present

## 2020-11-07 DIAGNOSIS — Z8546 Personal history of malignant neoplasm of prostate: Secondary | ICD-10-CM | POA: Diagnosis not present

## 2020-11-07 DIAGNOSIS — Z882 Allergy status to sulfonamides status: Secondary | ICD-10-CM | POA: Diagnosis not present

## 2020-11-07 HISTORY — PX: CARDIOVERSION: SHX1299

## 2020-11-07 SURGERY — CARDIOVERSION
Anesthesia: General

## 2020-11-07 MED ORDER — APIXABAN 5 MG PO TABS: 5.0000 mg | ORAL_TABLET | Freq: Two times a day (BID) | ORAL | 5 refills | Status: DC

## 2020-11-07 MED ORDER — ORAL CARE MOUTH RINSE: 15.0000 mL | Freq: Once | OROMUCOSAL | Status: AC

## 2020-11-07 MED ORDER — LACTATED RINGERS IV SOLN: INTRAVENOUS | Status: DC

## 2020-11-07 MED ORDER — EPHEDRINE SULFATE 50 MG/ML IJ SOLN
INTRAMUSCULAR | Status: DC | PRN
  Administered 2020-11-07: 10 mg via INTRAVENOUS

## 2020-11-07 MED ORDER — CHLORHEXIDINE GLUCONATE 0.12 % MT SOLN
15.0000 mL | Freq: Once | OROMUCOSAL | Status: AC
  Administered 2020-11-07: 15 mL via OROMUCOSAL

## 2020-11-07 MED ORDER — PROPOFOL 10 MG/ML IV BOLUS
INTRAVENOUS | Status: DC | PRN
  Administered 2020-11-07: 20 mg via INTRAVENOUS
  Administered 2020-11-07: 70 mg via INTRAVENOUS

## 2020-11-07 NOTE — H&P (Signed)
Procedure H&P   Patient presents to day for outpatient cardioversion for persistent atrial flutter. For complete medical history please refer to recent cardiology clinic note referenced below. He has been on eliquis at home for anticoagulation.   Cardiology Office Note    Date:  10/21/2020    ID:  Kyle [name]., DOB [birth year], MRN [id number]     PCP:  No primary care provider on file.              [place]  Cardiologist:  Carlyle Dolly, MD   Advanced Practice Provider:  No care team member to display Electrophysiologist:  None    [phone]    No chief complaint on file.     History of Present Illness:  Kyle [name]. is a 86 y.o. male with history of hypertension, chest pain in 2016 with normal NST.  Patient also has history of multinodular goiter history of meningioma status post prior resection, ocular myasthenia gravis, history of seizures.  Patient admitted to the hospital with new onset atrial fibrillation/flutter with RVR he was slowed with IV diltiazem but had to be changed to oral because of low blood pressures.  2D echo LVEF 65 to 70% with severe LVH moderate left atrial enlargement ChadVasc is 3 so started on Eliquis.  He remained in atrial flutter better controlled and is here for follow-up.  Echo also noted moderate aortic stenosis peak gradient 16.5 mmHg AVA 1.39 cm.   Patient comes in for f/u. Says he doesn't have any energy. 2 weeks ago before all this started he was feeling fine and very active-mowing 4 yards, gardening. Feels like he aged 39 yrs overnight. Heart rate running in 70-80's but jumped up to 122 when he walked to garden and mailbox.  TSH was normal. Had some hematuria and is being treated for a UTI.           Past Medical History:  Diagnosis Date   Arthritis     Auto immune neutropenia (HCC)     Bullous pemphigoid     Cancer (HCC)      Prostate   Cervical spondylosis without myelopathy 01/22/2016   DVT  (deep venous thrombosis) ([place])      a. Has had a previous right lower extremity DVT in the setting of hospitalization and surgery (after his brain tumor was resected in 1993) but is no longer on Coumadin.   Dyslipidemia     History of prostate cancer     Hypertension     Meningioma ([place])      a. s/p resection 1990s.   Myasthenia ([place]) 02/14/2013   Nocturnal leg cramps     Ocular myasthenia gravis ([place])     Peripheral neuropathy 11/23/2018   Seizures ([place])     Sinus bradycardia     Sleep paralysis     Thyroid nodule, cold             Past Surgical History:  Procedure Laterality Date   BRAIN SURGERY       CHOLECYSTECTOMY       COLONOSCOPY N/A 07/25/2013    Procedure: COLONOSCOPY;  Surgeon: Jamesetta So, MD;  Location: [facility];  Service: Gastroenterology;  Laterality: N/A;   COLONOSCOPY N/A 11/10/2016    Procedure: COLONOSCOPY;  Surgeon: Aviva Signs, MD;  Location: [facility];  Service: Gastroenterology;  Laterality: N/A;   IR CATHETER TUBE CHANGE   01/02/2017   IR RADIOLOGIST EVAL & MGMT  01/06/2017   IR RADIOLOGIST EVAL & MGMT   01/20/2017   IR RADIOLOGIST EVAL & MGMT   02/03/2017   IR RADIOLOGIST EVAL & MGMT   02/17/2017   IR SINUS/FIST TUBE CHK-NON GI   03/03/2017   RADIOACTIVE SEED IMPLANT          Current Medications: Active Medications      Current Meds  Medication Sig   acetaminophen (TYLENOL) 325 MG tablet Take 2 tablets (650 mg total) by mouth every 6 (six) hours as needed for mild pain (or Fever >/= 101).   apixaban (ELIQUIS) 5 MG TABS tablet Take 1 tablet (5 mg total) by mouth 2 (two) times daily.   diltiazem (CARDIZEM CD) 120 MG 24 hr capsule Take 1 capsule (120 mg total) by mouth daily.   fish oil-omega-3 fatty acids 1000 MG capsule Take 1 g by mouth 3 (three) times daily.   gabapentin (NEURONTIN) 600 MG tablet TAKE 1 TABLET BY MOUTH  DAILY (Patient taking differently: Take 600 mg by mouth daily.)   Garlic (GARLIQUE PO) Take 1 tablet by mouth daily.    levETIRAcetam (KEPPRA) 500 MG tablet Take 1/2 tablet in the morning, take 1 tablet in the evening (Patient taking differently: Take 250-500 mg by mouth 2 (two) times daily. Take 1/2 tablet in the morning, take 1 tablet in the evening)   MAGNESIUM CITRATE PO Take 1 tablet by mouth daily.   metoprolol succinate (TOPROL-XL) 25 MG 24 hr tablet Take 1 tablet (25 mg total) by mouth daily.   niacinamide 500 MG tablet Take 500 mg by mouth 3 (three) times daily.   Polyethyl Glycol-Propyl Glycol 0.4-0.3 % SOLN Place 1 drop into both eyes 3 (three) times daily as needed (for dry eyes).   pyridostigmine (MESTINON) 60 MG tablet TAKE 1 AND 1/2 TABLETS BY  MOUTH 4 TIMES DAILY (Patient taking differently: Take 90 mg by mouth 4 (four) times daily.)   simvastatin (ZOCOR) 20 MG tablet Take 20 mg by mouth at bedtime.   tiZANidine (ZANAFLEX) 4 MG tablet Take 4 mg by mouth at bedtime as needed for muscle spasms.        Allergies:   Iodinated diagnostic agents, Sulfa antibiotics, and Bactericin [bacitracin]    Social History         Socioeconomic History   Marital status: Married      Spouse name: Not on file   Number of children: 1   Years of education: 74 TH   Highest education level: Not on file  Occupational History   Occupation: retired  Tobacco Use   Smoking status: Never   Smokeless tobacco: Never  Substance and Sexual Activity   Alcohol use: No   Drug use: No   Sexual activity: Not on file  Other Topics Concern   Not on file  Social History Narrative    Patient is right handed.    Patient drinks 1 cup of coffee daily.    Social Determinants of Health    Financial Resource Strain: Not on file  Food Insecurity: Not on file  Transportation Needs: Not on file  Physical Activity: Not on file  Stress: Not on file  Social Connections: Not on file      Family History:  The patient's  family history includes Dementia in his sister; Diabetes in his mother and sister; Heart failure in his  daughter and mother; Lung cancer in his son.    ROS:   Please see the history of present illness.  ROS All other systems reviewed and are negative.     PHYSICAL EXAM:  VS:  BP 110/72   Pulse (!) 106   Ht '5\' 8"'$  (1.727 m)   Wt 155 lb (70.3 kg)   SpO2 98%   BMI 23.57 kg/m   Physical Exam  GEN: Thin, in no acute distress Neck: no JVD, carotid bruits, or masses Cardiac:RRR; no murmurs, rubs, or gallops Respiratory:  clear to auscultation bilaterally, normal work of breathing GI: soft, nontender, nondistended, + BS Ext: without cyanosis, clubbing, or edema, Good distal pulses bilaterally Neuro:  Alert and Oriented x 3 Psych: euthymic mood, full affect      Wt Readings from Last 3 Encounters:  10/21/20 155 lb (70.3 kg)  10/13/20 156 lb 8.4 oz (71 kg)  08/21/20 156 lb (70.8 kg)        Studies/Labs Reviewed:    EKG:  EKG is  ordered today.  The ekg ordered today demonstrates Atrial flutter 106/m nonspecific ST changes.   Recent Labs: 10/10/2020: TSH 2.438 10/14/2020: ALT 117; BUN 19; Creatinine, Ser 0.86; Hemoglobin 13.0; Magnesium 1.7; Platelets 107; Potassium 3.5; Sodium 129    Lipid Panel Labs (Brief)          Component Value Date/Time    CHOL 182 10/26/2014 0912    TRIG 130 10/26/2014 0912    HDL 67 10/26/2014 0912    CHOLHDL 2.7 10/26/2014 0912    VLDL 26 10/26/2014 0912    LDLCALC 89 10/26/2014 0912        Additional studies/ records that were reviewed today include:  Echo 10/10/20 IMPRESSIONS     1. Left ventricular ejection fraction, by estimation, is 65 to 70%. The  left ventricle has normal function. The left ventricle has no regional  wall motion abnormalities. There is severe left ventricular hypertrophy.  Left ventricular diastolic parameters   are indeterminate in the setting of atrial fibrillation/flutter.   2. Right ventricular systolic function is normal. The right ventricular  size is normal. There is normal pulmonary artery systolic pressure.  The  estimated right ventricular systolic pressure is 0000000 mmHg.   3. Left atrial size was moderately dilated.   4. The mitral valve is grossly normal. Trivial mitral valve  regurgitation.   5. The aortic valve is functionally bicuspid. There is moderate  calcification of the aortic valve. Aortic valve regurgitation is trivial.  Moderate aortic valve stenosis. Aortic valve mean gradient measures 10.0  mmHg. Dimentionless index 0.44.   6. The inferior vena cava is dilated in size with >50% respiratory  variability, suggesting right atrial pressure of 8 mmHg.   FINDINGS   Left Ventricle: Left ventricular ejection fraction, by estimation, is 65  to 70%. The left ventricle has normal function. The left ventricle has no  regional wall motion abnormalities. The left ventricular internal cavity  size was normal in size. There is   severe left ventricular hypertrophy. Left ventricular diastolic  parameters are indeterminate.   Right Ventricle: The right ventricular size is normal. No increase in  right ventricular wall thickness. Right ventricular systolic function is  normal. There is normal pulmonary artery systolic pressure. The tricuspid  regurgitant velocity is 2.28 m/s, and   with an assumed right atrial pressure of 8 mmHg, the estimated right  ventricular systolic pressure is 0000000 mmHg.   Left Atrium: Left atrial size was moderately dilated.   Right Atrium: Right atrial size was normal in size.   Pericardium: There is no evidence of pericardial  effusion.   Mitral Valve: The mitral valve is grossly normal. Trivial mitral valve  regurgitation. MV peak gradient, 4.4 mmHg. The mean mitral valve gradient  is 1.0 mmHg.   Tricuspid Valve: The tricuspid valve is grossly normal. Tricuspid valve  regurgitation is mild.   Aortic Valve: The aortic valve is bicuspid. There is moderate  calcification of the aortic valve. There is mild aortic valve annular  calcification. Aortic valve  regurgitation is trivial. Moderate aortic  stenosis is present. Aortic valve mean gradient  measures 10.0 mmHg. Aortic valve peak gradient measures 16.5 mmHg. Aortic  valve area, by VTI measures 1.39 cm.   Pulmonic Valve: The pulmonic valve was grossly normal. Pulmonic valve  regurgitation is trivial.   Aorta: The aortic root is normal in size and structure.   Venous: The inferior vena cava is dilated in size with greater than 50%  respiratory variability, suggesting right atrial pressure of 8 mmHg.   IAS/Shunts: No atrial level shunt detected by color flow Doppler.     LEFT VENTRICLE  PLAX 2D  LVIDd:         3.80 cm  Diastology  LVIDs:         2.53 cm  LV e' medial:    8.06 cm/s  LV PW:         1.58 cm  LV E/e' medial:  11.2  LV IVS:        1.66 cm  LV e' lateral:   15.30 cm/s  LVOT diam:     2.00 cm  LV E/e' lateral: 5.9  LV SV:         58  LV SV Index:   32  LVOT Area:     3.14 cm     RIGHT VENTRICLE  RV Basal diam:  3.64 cm  RV Mid diam:    3.54 cm  TAPSE (M-mode): 2.0 cm   LEFT ATRIUM              Index       RIGHT ATRIUM           Index  LA diam:        4.50 cm  2.46 cm/m  RA Area:     16.50 cm  LA Vol (A2C):   103.0 ml 56.32 ml/m RA Volume:   44.70 ml  24.44 ml/m  LA Vol (A4C):   82.0 ml  44.84 ml/m  LA Biplane Vol: 94.0 ml  51.40 ml/m   AORTIC VALVE  AV Area (Vmax):    1.39 cm  AV Area (Vmean):   1.22 cm  AV Area (VTI):     1.39 cm  AV Vmax:           203.00 cm/s  AV Vmean:          151.000 cm/s  AV VTI:            0.417 m  AV Peak Grad:      16.5 mmHg  AV Mean Grad:      10.0 mmHg  LVOT Vmax:         90.00 cm/s  LVOT Vmean:        58.800 cm/s  LVOT VTI:          0.184 m  LVOT/AV VTI ratio: 0.44    AORTA  Ao Root diam: 3.40 cm  Ao Asc diam:  3.60 cm   MITRAL VALVE  TRICUSPID VALVE  MV Area (PHT): 4.65 cm    TR Peak grad:   20.8 mmHg  MV Area VTI:   2.78 cm    TR Vmax:        228.00 cm/s  MV Peak grad:  4.4 mmHg  MV Mean  grad:  1.0 mmHg    SHUNTS  MV Vmax:       1.05 m/s    Systemic VTI:  0.18 m  MV Vmean:      43.3 cm/s   Systemic Diam: 2.00 cm  MV Decel Time: 163 msec  MV E velocity: 90.20 cm/s   Rozann Lesches MD  Electronically signed by Rozann Lesches MD  Signature Date/Time: 10/10/2020/3:44:51 PM        Final        NST 2016 Defect 1: There is a small, fixed defect of mild severity present in the basal inferolateral and mid inferolateral location. Likely due to artifact, but scar cannot entirely be excluded. This is a low risk study. The left ventricular ejection fraction is normal (55-65%). Nuclear stress EF: 60%.       Risk Assessment/Calculations:     CHA2DS2-VASc Score = 3  This indicates a 3.2% annual risk of stroke. The patient's score is based upon: CHF History: No HTN History: Yes Diabetes History: No Stroke History: No Vascular Disease History: No Age Score: 2 Gender Score: 0          ASSESSMENT:     1. New onset a-fib/Flutter    2. Aortic stenosis, moderate/Aortic valve mean gradient is 10.0 mmHg  3. Essential hypertension, benign         PLAN:  In order of problems listed above:   Atrial flutter on diltiazem and metoprolol and Eliquis. Feels terrible with fatigue since in Aflutter. HR overall controlled but goes up with activity. Echo with normal LVEF, severe LVH, mod AS, discussed with Dr. Harl Bowie who agrees we should try DCCV. Also offered TEE but patient thinks he can wait for 3 weeks of Eliquis. Hasn't missed a dose.  Rate reasonably well controlled. Has had trouble with anesthesia before -bladder and colon surgery in the past and was told he woke up violent.   Moderate Aortic Stenosis AVA 10 mmHG-will continue to moitor.   HTN with severe LVH on echo-controlled on diltiazem and metoprolol (was on losartan 25 mg daily but stopped when placed on above meds in the hospital).   History of mulinodular goiter-TSH normal 10/10/20 .   Shared Decision  Making/Informed Consent   Shared Decision Making/Informed Consent The risks (stroke, cardiac arrhythmias rarely resulting in the need for a temporary or permanent pacemaker, skin irritation or burns and complications associated with conscious sedation including aspiration, arrhythmia, respiratory failure and death), benefits (restoration of normal sinus rhythm) and alternatives of a direct current cardioversion were explained in detail to Mr. Kos and he agrees to proceed.        Medication Adjustments/Labs and Tests Ordered: Current medicines are reviewed at length with the patient today.  Concerns regarding medicines are outlined above.  Medication changes, Labs and Tests ordered today are listed in the Patient Instructions below. Patient Instructions  Medication Instructions:  Your physician recommends that you continue on your current medications as directed. Please refer to the Current Medication list given to you today.   *If you need a refill on your cardiac medications before your next appointment, please call your pharmacy*     Lab Work: Your physician recommends that  you return for lab work during pre op.   If you have labs (blood work) drawn today and your tests are completely normal, you will receive your results only by: [place] (if you have MyChart) OR A paper copy in the mail If you have any lab test that is abnormal or we need to change your treatment, we will call you to review the results.     Testing/Procedures: Your physician has recommended that you have a Cardioversion (DCCV). Electrical Cardioversion uses a jolt of electricity to your heart either through paddles or wired patches attached to your chest. This is a controlled, usually prescheduled, procedure. Defibrillation is done under light anesthesia in the hospital, and you usually go home the day of the procedure. This is done to get your heart back into a normal rhythm. You are not awake for the  procedure. Please see the instruction sheet given to you today.       Follow-Up: At [facility], you and your health needs are our priority.  As part of our continuing mission to provide you with exceptional heart care, we have created designated Provider Care Teams.  These Care Teams include your primary Cardiologist (physician) and Advanced Practice Providers (APPs -  Physician Assistants and Nurse Practitioners) who all work together to provide you with the care you need, when you need it.   We recommend signing up for the patient portal called "MyChart".  Sign up information is provided on this After Visit Summary.  MyChart is used to connect with patients for Virtual Visits (Telemedicine).  Patients are able to view lab/test results, encounter notes, upcoming appointments, etc.  Non-urgent messages can be sent to your provider as well.   To learn more about what you can do with MyChart, go to [email or web address].     Your next appointment:    After Cardioversion   The format for your next appointment:   In Person   Provider:   Ermalinda Barrios, PA-C

## 2020-11-07 NOTE — Progress Notes (Signed)
Minimal erythema left anterior chest from shock pad, no blistering noted, patient denies itching.

## 2020-11-07 NOTE — Anesthesia Preprocedure Evaluation (Signed)
Anesthesia Evaluation  Patient identified by MRN, date of birth, ID band Patient awake    Reviewed: Allergy & Precautions, H&P , NPO status , Patient's Chart, lab work & pertinent test results, reviewed documented beta blocker date and time   Airway Mallampati: II  TM Distance: >3 FB Neck ROM: full    Dental no notable dental hx.    Pulmonary neg pulmonary ROS,    Pulmonary exam normal breath sounds clear to auscultation       Cardiovascular Exercise Tolerance: Good hypertension, negative cardio ROS   Rhythm:regular Rate:Normal     Neuro/Psych Seizures -, Well Controlled,   Neuromuscular disease negative psych ROS   GI/Hepatic negative GI ROS, Neg liver ROS,   Endo/Other  negative endocrine ROS  Renal/GU negative Renal ROS  negative genitourinary   Musculoskeletal   Abdominal   Peds  Hematology negative hematology ROS (+)   Anesthesia Other Findings   Reproductive/Obstetrics negative OB ROS                             Anesthesia Physical Anesthesia Plan  ASA: 3  Anesthesia Plan: General   Post-op Pain Management:    Induction:   PONV Risk Score and Plan: Propofol infusion  Airway Management Planned:   Additional Equipment:   Intra-op Plan:   Post-operative Plan:   Informed Consent: I have reviewed the patients History and Physical, chart, labs and discussed the procedure including the risks, benefits and alternatives for the proposed anesthesia with the patient or authorized representative who has indicated his/her understanding and acceptance.     Dental Advisory Given  Plan Discussed with: CRNA  Anesthesia Plan Comments:         Anesthesia Quick Evaluation

## 2020-11-07 NOTE — CV Procedure (Addendum)
CV Procedure Note  Procedure: Electrical cardioversion Physician: Dr Carlyle Dolly MD Indication: atrial flutter   The patient was brought to the procedure suite after appropriate consent was obtained. I confirmed with patient that he had not missed any doses of eliquis within the last 3 weeks. Defib pads were placed in the anterior and posterior positions. He was converted from aflutter to sinus bradycardia with a single synchronized 200j shock. Sinus brady 40s-50s with normal blood pressure, will monitor in recovery and adjust medications prior to discharge.   Addendum 1050AM Heart rates low 50s in recovoery, normal blood pressures. We will d/c his diltiazem, continue his toprol '25mg'$  daily.    Carlyle Dolly MD

## 2020-11-07 NOTE — Transfer of Care (Signed)
Immediate Anesthesia Transfer of Care Note  Patient: Kyle [name].  Procedure(s) Performed: CARDIOVERSION  Patient Location: PACU  Anesthesia Type:General  Level of Consciousness: awake  Airway & Oxygen Therapy: Patient Spontanous Breathing  Post-op Assessment: Report given to RN  Post vital signs: Reviewed  Last Vitals:  Vitals Value Taken Time  BP    Temp    Pulse 52 11/07/20 0957  Resp 20 11/07/20 0957  SpO2 100 % 11/07/20 0957  Vitals shown include unvalidated device data.  Last Pain:  Vitals:   11/07/20 0825  TempSrc: Oral  PainSc: 0-No pain         Complications: No notable events documented.

## 2020-11-07 NOTE — Anesthesia Postprocedure Evaluation (Signed)
Anesthesia Post Note  Patient: Kyle [name].  Procedure(s) Performed: CARDIOVERSION  Patient location during evaluation: Phase II Anesthesia Type: General Level of consciousness: awake Pain management: pain level controlled Vital Signs Assessment: post-procedure vital signs reviewed and stable Respiratory status: spontaneous breathing and respiratory function stable Cardiovascular status: blood pressure returned to baseline and stable Postop Assessment: no headache and no apparent nausea or vomiting Anesthetic complications: no Comments: Late entry   No notable events documented.   Last Vitals:  Vitals:   11/07/20 1056 11/07/20 1100  BP:  96/62  Pulse: (!) 51 (!) 57  Resp: 13 16  Temp:  36.4 C  SpO2: 100% 100%    Last Pain:  Vitals:   11/07/20 1100  TempSrc: Oral  PainSc: [place]

## 2020-11-07 NOTE — Progress Notes (Signed)
Electrical Cardioversion Procedure Note Kyle [name] [id number] 18-Apr-1934  Procedure: Electrical Cardioversion Indications:  Atrial Flutter  Procedure Details Consent: Risks of procedure as well as the alternatives and risks of each were explained to the (patient/caregiver).  Consent for procedure obtained. Time Out: Verified patient identification, verified procedure, site/side was marked, verified correct patient position, special equipment/implants available, medications/allergies/relevent history reviewed, required imaging and test results available.  {time out performed: 0937  Patient placed on cardiac monitor, pulse oximetry, supplemental oxygen as necessary.  Sedation given: propofol Pacer pads placed anterior and posterior chest.  Cardioverted 1 time(s).  Cardioverted at [place].  Evaluation Findings: Post procedure EKG shows: sinus bradycardia Complications: None Patient did tolerate procedure well.   Harmon Pier 11/07/2020, 10:04 AM

## 2020-11-11 ENCOUNTER — Encounter (HOSPITAL_COMMUNITY): Payer: Self-pay | Admitting: Cardiology

## 2020-11-13 NOTE — Progress Notes (Signed)
Cardiology Office Note    Date:  11/25/2020   ID:  Kyle Schilling., DOB November 29, 1934, MRN [id number]   PCP:  [facility], [place]  Cardiologist:  Carlyle Dolly, MD   Advanced Practice Provider:  No care team member to display Electrophysiologist:  None   [phone]   No chief complaint on file.   History of Present Illness:  Kyle Puff. is a 86 y.o. male with history of hypertension, chest pain in 2016 with normal NST.  Patient also has history of multinodular goiter history of meningioma status post prior resection, ocular myasthenia gravis, history of seizures.  Patient admitted to the hospital with new onset atrial fibrillation/flutter with RVR he was slowed with IV diltiazem but had to be changed to oral because of low blood pressures.  2D echo LVEF 65 to 70% with severe LVH moderate left atrial enlargement ChadVasc is 3 so started on Eliquis.    Echo also noted moderate aortic stenosis peak gradient 16.5 mmHg AVA 1.39 cm.   Patient underwent successful cardioversion to sinus rhythm 11/07/2020.  He had bradycardia into the 40s and 50s so Dr. Harl Bowie stopped his diltiazem and continued him on Toprol.  Losartan had been stopped when he was placed on these medicines.  Patient comes in for f/u. He says heart rate is running 40-50's and doesn't have the energy he feels like he should. Saturday night when laying in bed he said his chest started hurting -dull ache-no tightness or pressure, radiation, dyspnea. Eased with getting up and sitting for 30 min. BP was 130/70 HR 40's. Denies reflux symptoms.. Denies chest pain with mowing lawn or exertion. Has a lot of arthritis pain since diclofenac stopped in the hospital.     Past Medical History:  Diagnosis Date   Arthritis    Auto immune neutropenia (HCC)    Bullous pemphigoid    Cancer (HCC)    Prostate   Cervical spondylosis without myelopathy [id number]   Complication of anesthesia     difficult to wake up after anesthesia   DVT (deep venous thrombosis) ([place])    a. Has had a previous right lower extremity DVT in the setting of hospitalization and surgery (after his brain tumor was resected in 1993) but is no longer on Coumadin.   Dyslipidemia    History of prostate cancer    Hypertension    Meningioma ([place])    a. s/p resection 1990s.   Myasthenia ([facility]) 02/14/2013   Nocturnal leg cramps    Ocular myasthenia gravis ([place])    Peripheral neuropathy 11/23/2018   Seizures ([place])    Sinus bradycardia    Sleep paralysis    Thyroid nodule, cold     Past Surgical History:  Procedure Laterality Date   BLADDER SURGERY     removed part of the bladder   BRAIN SURGERY     CARDIOVERSION N/A 11/07/2020   Procedure: CARDIOVERSION;  Surgeon: Arnoldo Lenis, MD;  Location: [facility];  Service: Endoscopy;  Laterality: N/A;   CHOLECYSTECTOMY     COLONOSCOPY N/A 07/25/2013   Procedure: COLONOSCOPY;  Surgeon: Jamesetta So, MD;  Location: [facility];  Service: Gastroenterology;  Laterality: N/A;   COLONOSCOPY N/A 11/10/2016   Procedure: COLONOSCOPY;  Surgeon: Aviva Signs, MD;  Location: [facility];  Service: Gastroenterology;  Laterality: N/A;   IR CATHETER TUBE CHANGE  01/02/2017   IR RADIOLOGIST EVAL & MGMT  01/06/2017   IR  RADIOLOGIST EVAL & MGMT  01/20/2017   IR RADIOLOGIST EVAL & MGMT  02/03/2017   IR RADIOLOGIST EVAL & MGMT  02/17/2017   IR SINUS/FIST TUBE CHK-NON GI  03/03/2017   PARTIAL COLECTOMY     RADIOACTIVE SEED IMPLANT      Current Medications: Current Meds  Medication Sig   acetaminophen (TYLENOL) 325 MG tablet Take 2 tablets (650 mg total) by mouth every 6 (six) hours as needed for mild pain (or Fever >/= 101).   apixaban (ELIQUIS) 5 MG TABS tablet Take 1 tablet (5 mg total) by mouth 2 (two) times daily.   Cholecalciferol (VITAMIN D) 50 MCG (2000 UT) tablet Take 8,000 Units by mouth daily.   clobetasol cream (TEMOVATE) [id number] % Apply 1 application  topically 2 (two) times daily as needed (blisters).   fish oil-omega-3 fatty acids 1000 MG capsule Take 1 g by mouth 3 (three) times daily.   gabapentin (NEURONTIN) 600 MG tablet TAKE 1 TABLET BY MOUTH  DAILY   levETIRAcetam (KEPPRA) 500 MG tablet Take 1/2 tablet in the morning, take 1 tablet in the evening   metoprolol succinate (TOPROL XL) 25 MG 24 hr tablet Take 0.5 tablets (12.5 mg total) by mouth daily.   niacinamide 500 MG tablet Take 500 mg by mouth 3 (three) times daily.   oxymetazoline (AFRIN) 0.05 % nasal spray Place 1 spray into both nostrils 2 (two) times daily as needed for congestion.   Polyethyl Glycol-Propyl Glycol 0.4-0.3 % SOLN Place 1 drop into both eyes 3 (three) times daily as needed (for dry eyes).   pyridostigmine (MESTINON) 60 MG tablet TAKE 1 AND 1/2 TABLETS BY  MOUTH 4 TIMES DAILY   [DISCONTINUED] metoprolol succinate (TOPROL-XL) 25 MG 24 hr tablet Take 1 tablet (25 mg total) by mouth daily.     Allergies:   Iodinated diagnostic agents, Sulfa antibiotics, and Bactericin [bacitracin]   Social History   Socioeconomic History   Marital status: Married    Spouse name: Not on file   Number of children: 1   Years of education: 38 TH   Highest education level: Not on file  Occupational History   Occupation: retired  Tobacco Use   Smoking status: Never   Smokeless tobacco: Never  Substance and Sexual Activity   Alcohol use: No   Drug use: No   Sexual activity: Not on file  Other Topics Concern   Not on file  Social History Narrative   Patient is right handed.   Patient drinks 1 cup of coffee daily.   Social Determinants of Health   Financial Resource Strain: Not on file  Food Insecurity: Not on file  Transportation Needs: Not on file  Physical Activity: Not on file  Stress: Not on file  Social Connections: Not on file     Family History:  The patient's  family history includes Dementia in his sister; Diabetes in his mother and sister; Heart failure in  his daughter and mother; Lung cancer in his son.   ROS:   Please see the history of present illness.    ROS All other systems reviewed and are negative.   PHYSICAL EXAM:   VS:  BP 118/60   Pulse (!) 50   Ht '5\' 8"'$  (1.727 m)   Wt 150 lb 3.2 oz (68.1 kg)   SpO2 96%   BMI 22.84 kg/m   Physical Exam  GEN: Thin, in no acute distress  Neck: no JVD, carotid bruits, or masses Cardiac:RRR; 2/6 systolic murmur  LSB Respiratory:  clear to auscultation bilaterally, normal work of breathing GI: soft, nontender, nondistended, + BS Ext: without cyanosis, clubbing, or edema, Good distal pulses bilaterally Neuro:  Alert and Oriented x 3, Psych: euthymic mood, full affect  Wt Readings from Last 3 Encounters:  11/25/20 150 lb 3.2 oz (68.1 kg)  11/05/20 150 lb (68 kg)  10/21/20 155 lb (70.3 kg)      Studies/Labs Reviewed:   EKG:  EKG is  ordered today.  The ekg ordered today demonstrates sinus bradycardia 41/m nonspecific ST changes  Recent Labs: 10/10/2020: TSH 2.438 10/14/2020: ALT 117; Hemoglobin 13.0; Magnesium 1.7; Platelets 107 11/05/2020: BUN 23; Creatinine, Ser 0.93; Potassium 4.2; Sodium 136   Lipid Panel    Component Value Date/Time   CHOL 182 10/26/2014 0912   TRIG 130 10/26/2014 0912   HDL 67 10/26/2014 0912   CHOLHDL 2.7 10/26/2014 0912   VLDL 26 10/26/2014 0912   LDLCALC 89 10/26/2014 0912    Additional studies/ records that were reviewed today include:  Echo 10/10/20 IMPRESSIONS     1. Left ventricular ejection fraction, by estimation, is 65 to 70%. The  left ventricle has normal function. The left ventricle has no regional  wall motion abnormalities. There is severe left ventricular hypertrophy.  Left ventricular diastolic parameters   are indeterminate in the setting of atrial fibrillation/flutter.   2. Right ventricular systolic function is normal. The right ventricular  size is normal. There is normal pulmonary artery systolic pressure. The  estimated right  ventricular systolic pressure is 0000000 mmHg.   3. Left atrial size was moderately dilated.   4. The mitral valve is grossly normal. Trivial mitral valve  regurgitation.   5. The aortic valve is functionally bicuspid. There is moderate  calcification of the aortic valve. Aortic valve regurgitation is trivial.  Moderate aortic valve stenosis. Aortic valve mean gradient measures 10.0  mmHg. Dimentionless index 0.44.   6. The inferior vena cava is dilated in size with >50% respiratory  variability, suggesting right atrial pressure of 8 mmHg.   FINDINGS   Left Ventricle: Left ventricular ejection fraction, by estimation, is 65  to 70%. The left ventricle has normal function. The left ventricle has no  regional wall motion abnormalities. The left ventricular internal cavity  size was normal in size. There is   severe left ventricular hypertrophy. Left ventricular diastolic  parameters are indeterminate.   Right Ventricle: The right ventricular size is normal. No increase in  right ventricular wall thickness. Right ventricular systolic function is  normal. There is normal pulmonary artery systolic pressure. The tricuspid  regurgitant velocity is 2.28 m/s, and   with an assumed right atrial pressure of 8 mmHg, the estimated right  ventricular systolic pressure is 0000000 mmHg.   Left Atrium: Left atrial size was moderately dilated.   Right Atrium: Right atrial size was normal in size.   Pericardium: There is no evidence of pericardial effusion.   Mitral Valve: The mitral valve is grossly normal. Trivial mitral valve  regurgitation. MV peak gradient, 4.4 mmHg. The mean mitral valve gradient  is 1.0 mmHg.   Tricuspid Valve: The tricuspid valve is grossly normal. Tricuspid valve  regurgitation is mild.   Aortic Valve: The aortic valve is bicuspid. There is moderate  calcification of the aortic valve. There is mild aortic valve annular  calcification. Aortic valve regurgitation is trivial.  Moderate aortic  stenosis is present. Aortic valve mean gradient  measures 10.0 mmHg. Aortic valve peak gradient  measures 16.5 mmHg. Aortic  valve area, by VTI measures 1.39 cm.   Pulmonic Valve: The pulmonic valve was grossly normal. Pulmonic valve  regurgitation is trivial.   Aorta: The aortic root is normal in size and structure.   Venous: The inferior vena cava is dilated in size with greater than 50%  respiratory variability, suggesting right atrial pressure of 8 mmHg.   IAS/Shunts: No atrial level shunt detected by color flow Doppler.     LEFT VENTRICLE  PLAX 2D  LVIDd:         3.80 cm  Diastology  LVIDs:         2.53 cm  LV e' medial:    8.06 cm/s  LV PW:         1.58 cm  LV E/e' medial:  11.2  LV IVS:        1.66 cm  LV e' lateral:   15.30 cm/s  LVOT diam:     2.00 cm  LV E/e' lateral: 5.9  LV SV:         58  LV SV Index:   32  LVOT Area:     3.14 cm     RIGHT VENTRICLE  RV Basal diam:  3.64 cm  RV Mid diam:    3.54 cm  TAPSE (M-mode): 2.0 cm   LEFT ATRIUM              Index       RIGHT ATRIUM           Index  LA diam:        4.50 cm  2.46 cm/m  RA Area:     16.50 cm  LA Vol (A2C):   103.0 ml 56.32 ml/m RA Volume:   44.70 ml  24.44 ml/m  LA Vol (A4C):   82.0 ml  44.84 ml/m  LA Biplane Vol: 94.0 ml  51.40 ml/m   AORTIC VALVE  AV Area (Vmax):    1.39 cm  AV Area (Vmean):   1.22 cm  AV Area (VTI):     1.39 cm  AV Vmax:           203.00 cm/s  AV Vmean:          151.000 cm/s  AV VTI:            0.417 m  AV Peak Grad:      16.5 mmHg  AV Mean Grad:      10.0 mmHg  LVOT Vmax:         90.00 cm/s  LVOT Vmean:        58.800 cm/s  LVOT VTI:          0.184 m  LVOT/AV VTI ratio: 0.44    AORTA  Ao Root diam: 3.40 cm  Ao Asc diam:  3.60 cm   MITRAL VALVE               TRICUSPID VALVE  MV Area (PHT): 4.65 cm    TR Peak grad:   20.8 mmHg  MV Area VTI:   2.78 cm    TR Vmax:        228.00 cm/s  MV Peak grad:  4.4 mmHg  MV Mean grad:  1.0 mmHg    SHUNTS   MV Vmax:       1.05 m/s    Systemic VTI:  0.18 m  MV Vmean:      43.3 cm/s   Systemic Diam: 2.00 cm  MV Decel Time: 163 msec  MV E velocity: 90.20 cm/s   Rozann Lesches MD  Electronically signed by Rozann Lesches MD  Signature Date/Time: 10/10/2020/3:44:51 PM        Final        NST 2016 Defect 1: There is a small, fixed defect of mild severity present in the basal inferolateral and mid inferolateral location. Likely due to artifact, but scar cannot entirely be excluded. This is a low risk study. The left ventricular ejection fraction is normal (55-65%). Nuclear stress EF: 60%.     Risk Assessment/Calculations:    CHA2DS2-VASc Score = 3  This indicates a 3.2% annual risk of stroke. The patient's score is based upon: CHF History: No HTN History: Yes Diabetes History: No Stroke History: No Vascular Disease History: No Age Score: 2 Gender Score: 0        ASSESSMENT:    1. New onset a-fib/Flutter    2. Essential hypertension, benign   3. Aortic stenosis, moderate/Aortic valve mean gradient is 10.0 mmHg   4. History of DVT (deep vein thrombosis)   5. Chest pain, unspecified type   6. Arthritis      PLAN:  In order of problems listed above:  Atrial flutter status post DC CV 11/07/2020 to sinus bradycardia diltiazem stopped and he was continued on Toprol. HR still 41/m and fatigue at home. Will decrease to toprol xl 12.5 mg daily. Come back for EKG on Friday.  Chest pain- first episode Sat night. Will order Lexiscan  Hypertension with severe LVH on echo previously on losartan but stopped when placed on diltiazem and metoprolol.  Now off diltiazem since cardioverted -BP stable so will not restart yet.   Moderate aortic stenosis AVA 10 mmHg  History of DVT  History of nontoxic multinodular goiter  Arthritis-having a lot of pain off diclofenac which was stopped because of Eliquis. Will refer to rheumatologist.  Shared Decision Making/Informed Consent    Shared Decision Making/Informed Consent The risks [chest pain, shortness of breath, cardiac arrhythmias, dizziness, blood pressure fluctuations, myocardial infarction, stroke/transient ischemic attack, nausea, vomiting, allergic reaction, radiation exposure, metallic taste sensation and life-threatening complications (estimated to be 1 in 10,000)], benefits (risk stratification, diagnosing coronary artery disease, treatment guidance) and alternatives of a nuclear stress test were discussed in detail with Kyle [name] and he agrees to proceed.    Medication Adjustments/Labs and Tests Ordered: Current medicines are reviewed at length with the patient today.  Concerns regarding medicines are outlined above.  Medication changes, Labs and Tests ordered today are listed in the Patient Instructions below. Patient Instructions  Medication Instructions:  Your physician has recommended you make the following change in your medication:  DECREASE Metoprolol Succinate to 12.5 mg ( half a tablet) once daily  *If you need a refill on your cardiac medications before your next appointment, please call your pharmacy*   Lab Work: None If you have labs (blood work) drawn today and your tests are completely normal, you will receive your results only by: [place] (if you have MyChart) OR A paper copy in the mail If you have any lab test that is abnormal or we need to change your treatment, we will call you to review the results.   Testing/Procedures: Your physician has requested that you have a lexiscan myoview. For further information please visit [email or web address]. Please follow instruction sheet, as given.    Follow-Up: At [facility], you and your health needs are our priority.  As part of our  continuing mission to provide you with exceptional heart care, we have created designated Provider Care Teams.  These Care Teams include your primary Cardiologist (physician) and Advanced Practice  Providers (APPs -  Physician Assistants and Nurse Practitioners) who all work together to provide you with the care you need, when you need it.  We recommend signing up for the patient portal called "MyChart".  Sign up information is provided on this After Visit Summary.  MyChart is used to connect with patients for Virtual Visits (Telemedicine).  Patients are able to view lab/test results, encounter notes, upcoming appointments, etc.  Non-urgent messages can be sent to your provider as well.   To learn more about what you can do with MyChart, go to [email or web address].    Your next appointment:   1 month(s) after Leane Call  The format for your next appointment:   In Person  Provider:   You will see one of the following Advanced Practice Providers on your designated Care Team:   Bernerd Pho, PA-C  Ermalinda Barrios, PA-C      Other Instructions     Signed, Ermalinda Barrios, PA-C  11/25/2020 11:26 AM    [address], [place],   [address] Phone: [phone]; Fax: [phone]

## 2020-11-19 DIAGNOSIS — R300 Dysuria: Secondary | ICD-10-CM | POA: Diagnosis not present

## 2020-11-19 DIAGNOSIS — I4891 Unspecified atrial fibrillation: Secondary | ICD-10-CM | POA: Diagnosis not present

## 2020-11-19 DIAGNOSIS — N481 Balanitis: Secondary | ICD-10-CM | POA: Diagnosis not present

## 2020-11-19 DIAGNOSIS — E063 Autoimmune thyroiditis: Secondary | ICD-10-CM | POA: Diagnosis not present

## 2020-11-19 DIAGNOSIS — Z6822 Body mass index (BMI) 22.0-22.9, adult: Secondary | ICD-10-CM | POA: Diagnosis not present

## 2020-11-25 ENCOUNTER — Other Ambulatory Visit: Payer: Self-pay

## 2020-11-25 ENCOUNTER — Encounter: Payer: Self-pay | Admitting: Physician Assistant

## 2020-11-25 ENCOUNTER — Telehealth: Payer: Self-pay | Admitting: *Deleted

## 2020-11-25 ENCOUNTER — Ambulatory Visit: Payer: Medicare Other | Admitting: Physician Assistant

## 2020-11-25 VITALS — BP 118/60 | HR 50 | Ht 68.0 in | Wt 150.2 lb

## 2020-11-25 DIAGNOSIS — I350 Nonrheumatic aortic (valve) stenosis: Secondary | ICD-10-CM

## 2020-11-25 DIAGNOSIS — Z86718 Personal history of other venous thrombosis and embolism: Secondary | ICD-10-CM | POA: Diagnosis not present

## 2020-11-25 DIAGNOSIS — R079 Chest pain, unspecified: Secondary | ICD-10-CM

## 2020-11-25 DIAGNOSIS — I4891 Unspecified atrial fibrillation: Secondary | ICD-10-CM

## 2020-11-25 DIAGNOSIS — I10 Essential (primary) hypertension: Secondary | ICD-10-CM | POA: Diagnosis not present

## 2020-11-25 DIAGNOSIS — M1990 Unspecified osteoarthritis, unspecified site: Secondary | ICD-10-CM

## 2020-11-25 MED ORDER — APIXABAN 5 MG PO TABS
5.0000 mg | ORAL_TABLET | Freq: Two times a day (BID) | ORAL | 5 refills | Status: DC
Start: 2020-11-25 — End: 2021-01-07

## 2020-11-25 MED ORDER — METOPROLOL SUCCINATE ER 25 MG PO TB24: 12.5000 mg | ORAL_TABLET | Freq: Every day | ORAL | 3 refills | Status: DC

## 2020-11-25 NOTE — Telephone Encounter (Signed)
Prescription refill request for Eliquis received. Indication:  A Fib/DVT Last office visit: 11/25/20  Gerrianne Scale PA-C Scr: 0.93 on 11/05/20 Age: 86 Weight: 68.1kg  Based on above findings Eliquis '5mg'$  twice daily is the appropriate dose.  Refill approved.

## 2020-11-25 NOTE — Patient Instructions (Signed)
Medication Instructions:  Your physician has recommended you make the following change in your medication:  DECREASE Metoprolol Succinate to 12.5 mg ( half a tablet) once daily  *If you need a refill on your cardiac medications before your next appointment, please call your pharmacy*   Lab Work: None If you have labs (blood work) drawn today and your tests are completely normal, you will receive your results only by: [place] (if you have MyChart) OR A paper copy in the mail If you have any lab test that is abnormal or we need to change your treatment, we will call you to review the results.   Testing/Procedures: Your physician has requested that you have a lexiscan myoview. For further information please visit [email or web address]. Please follow instruction sheet, as given.    Follow-Up: At [facility], you and your health needs are our priority.  As part of our continuing mission to provide you with exceptional heart care, we have created designated Provider Care Teams.  These Care Teams include your primary Cardiologist (physician) and Advanced Practice Providers (APPs -  Physician Assistants and Nurse Practitioners) who all work together to provide you with the care you need, when you need it.  We recommend signing up for the patient portal called "MyChart".  Sign up information is provided on this After Visit Summary.  MyChart is used to connect with patients for Virtual Visits (Telemedicine).  Patients are able to view lab/test results, encounter notes, upcoming appointments, etc.  Non-urgent messages can be sent to your provider as well.   To learn more about what you can do with MyChart, go to [email or web address].    Your next appointment:   1 month(s) after Leane Call  The format for your next appointment:   In Person  Provider:   You will see one of the following Advanced Practice Providers on your designated Care Team:   [place], PA-C  Ermalinda Barrios, [place]      Other Instructions

## 2020-11-29 ENCOUNTER — Ambulatory Visit (INDEPENDENT_AMBULATORY_CARE_PROVIDER_SITE_OTHER): Payer: Medicare Other

## 2020-11-29 ENCOUNTER — Other Ambulatory Visit: Payer: Self-pay

## 2020-11-29 VITALS — BP 136/66 | HR 55

## 2020-11-29 DIAGNOSIS — R5383 Other fatigue: Secondary | ICD-10-CM

## 2020-11-29 NOTE — Progress Notes (Signed)
Seen for nurse visit today s/p cardioversion for new onset a-fib/flutter on 11/07/20.   At follow office visit with [facility], PA-C on 11/25/20, patient had noted fatigue and bradycardia. Diltazem had been stopped and Toprol Xl dose decreased from 25 mg daily to 12.5 mg daily.   Patient still has fatigue. He offered home BP log with systolic running [id number] to [id number], diastolic in the 0000000. HR 42-53 bpm. I will have log scanned.   EKG obtained, sinus bradycardia, HR 53   I will review with DOD for recommendations.   Dr.McDowell suggests Kyle [name] Hold Toprol XL now. He will continue to monitor HR/BP at home and notify us if HR becomes elevated.

## 2020-11-29 NOTE — Patient Instructions (Signed)
HOLD Toprol XL   Monitor heart rate and blood pressure at home    Call us if you notice elevated heart rates    I will call you if there are any further recommendations from [facility], PA-C

## 2020-12-03 ENCOUNTER — Telehealth: Payer: Self-pay | Admitting: Physician Assistant

## 2020-12-03 NOTE — Telephone Encounter (Signed)
Patient seen for nurse visit Friday 8/19.   Lopressor stopped due to continued fatigue and bradycardia.   Today patient states he has intermittent SOB since last week. No ankle edema. He does report some tightness in his chest intermittently.   He has a lexiscan scheduled for this Thursday, 8/25   Weight is 149 lbs, BP 132/70, HR 59     I will forward to M.Bonnell Public, PA-C

## 2020-12-03 NOTE — Telephone Encounter (Signed)
Patient was seen by Ermalinda Barrios and  she cut  metoprolol tartrate (LOPRESSOR) tablet 25 mg then nurse removed it on Fri. Patient is having SOB since last appt seems more frequent. PT is still on Eliquist. [phone]. He scheduled to have stress test on Thurs 8/25. He is concerned.

## 2020-12-03 NOTE — Telephone Encounter (Signed)
I spoke with patient . He will have lexiscan in 2 days and we will proceed from there.

## 2020-12-04 ENCOUNTER — Telehealth: Payer: Self-pay | Admitting: Neurology

## 2020-12-04 NOTE — Telephone Encounter (Signed)
Pt called stating that he has been diagnosed with afib and the cardiologist put him on blood thinners and had him stop taking the Diclofenac. Pt would like to know if he can be put on another medication to take its place that will not interfere with his blood thinner. Please advise.

## 2020-12-04 NOTE — Telephone Encounter (Signed)
Off of diclofenac, the patient is an increase in his arthritis pain.  He has been placed on anticoagulant therapy with afibrillation.  The only option he may have is taking Tylenol rather than diclofenac or any other nonsteroidal anti-inflammatory medication.

## 2020-12-05 ENCOUNTER — Ambulatory Visit (HOSPITAL_COMMUNITY)
Admission: RE | Admit: 2020-12-05 | Discharge: 2020-12-05 | Disposition: A | Discharge status: Home / Self Care | Payer: Medicare Other | Source: Ambulatory Visit | Attending: Physician Assistant | Admitting: Physician Assistant

## 2020-12-05 ENCOUNTER — Encounter (HOSPITAL_COMMUNITY)
Admission: RE | Admit: 2020-12-05 | Discharge: 2020-12-05 | Disposition: A | Discharge status: Home / Self Care | Payer: Medicare Other | Source: Ambulatory Visit | Attending: Physician Assistant | Admitting: Physician Assistant

## 2020-12-05 ENCOUNTER — Other Ambulatory Visit: Payer: Self-pay

## 2020-12-05 DIAGNOSIS — R079 Chest pain, unspecified: Secondary | ICD-10-CM | POA: Diagnosis not present

## 2020-12-05 LAB — NM MYOCAR MULTI W/SPECT W/WALL MOTION / EF
LV dias vol: 80 mL (ref 62–150)
LV sys vol: 37 mL
Nuc Stress EF: 53 %
Peak HR: 75 {beats}/min
RATE: 0.3
Rest HR: 52 {beats}/min
Rest Nuclear Isotope Dose: 11 mCi
SDS: 0
SRS: 6
SSS: 6
ST Depression (mm): 1 mm
Stress Nuclear Isotope Dose: 32 mCi
TID: 0.98

## 2020-12-05 MED ORDER — REGADENOSON 0.4 MG/5ML IV SOLN
INTRAVENOUS | Status: AC
  Administered 2020-12-05: 0.4 mg via INTRAVENOUS
  Filled 2020-12-05: qty 5

## 2020-12-05 MED ORDER — SODIUM CHLORIDE FLUSH 0.9 % IV SOLN
INTRAVENOUS | Status: AC
  Administered 2020-12-05: 10 mL via INTRAVENOUS
  Filled 2020-12-05: qty 10

## 2020-12-05 MED ORDER — TECHNETIUM TC 99M TETROFOSMIN IV KIT
10.0000 | PACK | Freq: Once | INTRAVENOUS | Status: AC | PRN
  Administered 2020-12-05: 11 via INTRAVENOUS

## 2020-12-05 MED ORDER — TECHNETIUM TC 99M TETROFOSMIN IV KIT
30.0000 | PACK | Freq: Once | INTRAVENOUS | Status: AC | PRN
  Administered 2020-12-05: 32 via INTRAVENOUS

## 2020-12-23 DIAGNOSIS — Z6821 Body mass index (BMI) 21.0-21.9, adult: Secondary | ICD-10-CM | POA: Diagnosis not present

## 2020-12-23 DIAGNOSIS — Z7689 Persons encountering health services in other specified circumstances: Secondary | ICD-10-CM | POA: Diagnosis not present

## 2020-12-23 DIAGNOSIS — Z6822 Body mass index (BMI) 22.0-22.9, adult: Secondary | ICD-10-CM | POA: Diagnosis not present

## 2020-12-23 DIAGNOSIS — K4090 Unilateral inguinal hernia, without obstruction or gangrene, not specified as recurrent: Secondary | ICD-10-CM | POA: Diagnosis not present

## 2021-01-01 ENCOUNTER — Ambulatory Visit: Payer: Medicare Other | Admitting: Internal Medicine

## 2021-01-01 NOTE — Progress Notes (Signed)
Cardiology Office Note    Date:  01/07/2021   ID:  Kyle Schilling., DOB 21-Jan-1935, MRN [id number]   PCP:  Redmond School, MD   [place]  Cardiologist:  Carlyle Dolly, MD   Advanced Practice Provider:  No care team member to display Electrophysiologist:  None   [phone]   Chief Complaint  Patient presents with   Follow-up     History of Present Illness:  Kyle Labelle. is a 86 y.o. male with history of hypertension, chest pain in 2016 with normal NST.  Patient also has history of multinodular goiter history of meningioma status post prior resection, ocular myasthenia gravis, history of seizures.  Patient admitted to the hospital with new onset atrial fibrillation/flutter with RVR he was slowed with IV diltiazem but had to be changed to oral because of low blood pressures.  2D echo LVEF 65 to 70% with severe LVH moderate left atrial enlargement ChadVasc is 3 so started on Eliquis.    Echo also noted moderate aortic stenosis peak gradient 16.5 mmHg AVA 1.39 cm.    Patient underwent successful cardioversion to sinus rhythm 11/07/2020.  He had bradycardia into the 40s and 50s so Dr. Harl Bowie stopped his diltiazem and continued him on Toprol.  Losartan had been stopped when he was placed on these medicines.   I saw the patient 11/25/20 with extreme fatigue and HR 41. I decreased toprol x. 12.5 mg daily. Also referred to rheumatologist for pain off diclofenac because of Eliquis. F/U EKG 11/29/20 toprol stopped b/c of HR 53. Called in with SOB and chest tightness. Lexiscan no ischemia.  Patient comes in for f/u. He has more energy off metoprolol. Pulse is up in 60's now. BP trending higher 135-144/79-86. No further chest pain or shortness of breath. Was on losartan 25 mg bid prior to this. Has an inguinal hernia.       Past Medical History:  Diagnosis Date   Arthritis    Auto immune neutropenia (HCC)    Bullous pemphigoid    Cancer (HCC)     Prostate   Cervical spondylosis without myelopathy 19/62/2297   Complication of anesthesia    difficult to wake up after anesthesia   DVT (deep venous thrombosis) ([place])    a. Has had a previous right lower extremity DVT in the setting of hospitalization and surgery (after his brain tumor was resected in 1993) but is no longer on Coumadin.   Dyslipidemia    History of prostate cancer    Hypertension    Meningioma ([place])    a. s/p resection 1990s.   Myasthenia ([place]) 02/14/2013   Nocturnal leg cramps    Ocular myasthenia gravis ([place])    Peripheral neuropathy 11/23/2018   Seizures ([place])    Sinus bradycardia    Sleep paralysis    Thyroid nodule, cold     Past Surgical History:  Procedure Laterality Date   BLADDER SURGERY     removed part of the bladder   BRAIN SURGERY     CARDIOVERSION N/A 11/07/2020   Procedure: CARDIOVERSION;  Surgeon: Arnoldo Lenis, MD;  Location: [facility];  Service: Endoscopy;  Laterality: N/A;   CHOLECYSTECTOMY     COLONOSCOPY N/A 07/25/2013   Procedure: COLONOSCOPY;  Surgeon: Jamesetta So, MD;  Location: [facility];  Service: Gastroenterology;  Laterality: N/A;   COLONOSCOPY N/A 11/10/2016   Procedure: COLONOSCOPY;  Surgeon: Aviva Signs, MD;  Location: [facility];  Service: Gastroenterology;  Laterality: N/A;   IR CATHETER TUBE CHANGE  01/02/2017   IR RADIOLOGIST EVAL & MGMT  01/06/2017   IR RADIOLOGIST EVAL & MGMT  01/20/2017   IR RADIOLOGIST EVAL & MGMT  02/03/2017   IR RADIOLOGIST EVAL & MGMT  02/17/2017   IR SINUS/FIST TUBE CHK-NON GI  03/03/2017   PARTIAL COLECTOMY     RADIOACTIVE SEED IMPLANT      Current Medications: Current Meds  Medication Sig   acetaminophen (TYLENOL) 325 MG tablet Take 2 tablets (650 mg total) by mouth every 6 (six) hours as needed for mild pain (or Fever >/= 101).   Cholecalciferol (VITAMIN D) 50 MCG (2000 UT) tablet Take 8,000 Units by mouth daily.   clobetasol cream (TEMOVATE) 4.49 % Apply 1 application  topically 2 (two) times daily as needed (blisters).   fish oil-omega-3 fatty acids 1000 MG capsule Take 1 g by mouth 3 (three) times daily.   gabapentin (NEURONTIN) 600 MG tablet TAKE 1 TABLET BY MOUTH  DAILY   Garlic (GARLIQUE PO) Take 1 tablet by mouth daily.   levETIRAcetam (KEPPRA) 500 MG tablet Take 1/2 tablet in the morning, take 1 tablet in the evening   magnesium oxide (MAG-OX) 400 MG tablet Take 400 mg by mouth at bedtime.   niacinamide 500 MG tablet Take 500 mg by mouth 3 (three) times daily.   oxymetazoline (AFRIN) 0.05 % nasal spray Place 1 spray into both nostrils 2 (two) times daily as needed for congestion.   Polyethyl Glycol-Propyl Glycol 0.4-0.3 % SOLN Place 1 drop into both eyes 3 (three) times daily as needed (for dry eyes).   pyridostigmine (MESTINON) 60 MG tablet TAKE 1 AND 1/2 TABLETS BY  MOUTH 4 TIMES DAILY   simvastatin (ZOCOR) 20 MG tablet Take 20 mg by mouth at bedtime.   tiZANidine (ZANAFLEX) 4 MG tablet Take 4 mg by mouth at bedtime.   [DISCONTINUED] apixaban (ELIQUIS) 5 MG TABS tablet Take 1 tablet (5 mg total) by mouth 2 (two) times daily.   [DISCONTINUED] losartan (COZAAR) 25 MG tablet Take 1 tablet (25 mg total) by mouth daily.     Allergies:   Iodinated diagnostic agents, Sulfa antibiotics, and Bactericin [bacitracin]   Social History   Socioeconomic History   Marital status: Married    Spouse name: Not on file   Number of children: 1   Years of education: 18 TH   Highest education level: Not on file  Occupational History   Occupation: retired  Tobacco Use   Smoking status: Never   Smokeless tobacco: Never  Substance and Sexual Activity   Alcohol use: No   Drug use: No   Sexual activity: Not on file  Other Topics Concern   Not on file  Social History Narrative   Patient is right handed.   Patient drinks 1 cup of coffee daily.   Social Determinants of Health   Financial Resource Strain: Not on file  Food Insecurity: Not on file   Transportation Needs: Not on file  Physical Activity: Not on file  Stress: Not on file  Social Connections: Not on file     Family History:  The patient's  family history includes Dementia in his sister; Diabetes in his mother and sister; Heart failure in his daughter and mother; Lung cancer in his son.   ROS:   Please see the history of present illness.    ROS All other systems reviewed and are negative.   PHYSICAL EXAM:   VS:  BP  132/60   Pulse 60   Ht 5\' 8"  (1.727 m)   Wt 148 lb (67.1 kg)   SpO2 99%   BMI 22.50 kg/m   Physical Exam  GEN: Well nourished, well developed, in no acute distress  Neck: no JVD, carotid bruits, or masses Cardiac:RRR; no murmurs, rubs, or gallops  Respiratory:  clear to auscultation bilaterally, normal work of breathing GI: soft, nontender, nondistended, + BS Ext: without cyanosis, clubbing, or edema, Good distal pulses bilaterally Neuro:  Alert and Oriented x 3, Psych: euthymic mood, full affect  Wt Readings from Last 3 Encounters:  01/07/21 148 lb (67.1 kg)  11/25/20 150 lb 3.2 oz (68.1 kg)  11/05/20 150 lb (68 kg)      Studies/Labs Reviewed:   EKG:  EKG is not ordered today.    Recent Labs: 10/10/2020: TSH 2.438 10/14/2020: ALT 117; Hemoglobin 13.0; Magnesium 1.7; Platelets 107 11/05/2020: BUN 23; Creatinine, Ser 0.93; Potassium 4.2; Sodium 136   Lipid Panel    Component Value Date/Time   CHOL 182 10/26/2014 0912   TRIG 130 10/26/2014 0912   HDL 67 10/26/2014 0912   CHOLHDL 2.7 10/26/2014 0912   VLDL 26 10/26/2014 0912   LDLCALC 89 10/26/2014 0912    Additional studies/ records that were reviewed today include:  Lexiscan 12/05/20   The study is normal. The study is low risk. There are no perfusion defects consistent with prior infarct or current ischemia.   1.0 mm of horizontal ST depression in the inferior leads (II, III and aVF) was noted.   Defect 1: There is a medium defect with moderate reduction in uptake present in the  apical to basal inferior location(s). The defect is most intense in the resting images most consistent with subdiaphragmatic attenuation.   Left ventricular function is normal.   Risk Assessment/Calculations:    CHA2DS2-VASc Score = 3   This indicates a 3.2% annual risk of stroke. The patient's score is based upon: CHF History: 0 HTN History: 1 Diabetes History: 0 Stroke History: 0 Vascular Disease History: 0 Age Score: 2 Gender Score: 0        ASSESSMENT:    1. New onset a-fib/Flutter    2. Chest pain, unspecified type   3. Essential hypertension   4. Aortic stenosis, moderate/Aortic valve mean gradient is 10.0 mmHg   5. History of DVT (deep vein thrombosis)   6. Nontoxic multinodular goiter   7. Arthritis      PLAN:  In order of problems listed above:  Atrial flutter status post DCCV 11/07/2020 to sinus bradycardia diltiazem stopped and he was continued on Toprol. HR was still low and toprol stopped as well. Patient feeling much better. No palpitations   Chest pain-  Lexiscan normal 11/25/20-no further chest pain   Hypertension with severe LVH on echo previously on losartan but stopped when placed on diltiazem and metoprolol.  Now off diltiazem since cardioverted -BP starting to climb so will restart losartan 25 bid   Moderate aortic stenosis AVA 10 mmHg-asymptomatic. Will continue to monitor   History of DVT   History of nontoxic multinodular goiter   Arthritis-having a lot of pain off diclofenac which was stopped because of Eliquis. referred to rheumatologist but he cancelled appt b/c he had so many other appts.    Shared Decision Making/Informed Consent        Medication Adjustments/Labs and Tests Ordered: Current medicines are reviewed at length with the patient today.  Concerns regarding medicines are outlined above.  Medication changes, Labs and Tests ordered today are listed in the Patient Instructions below. Patient Instructions  Medication  Instructions:  Your physician recommends that you continue on your current medications as directed. Please refer to the Current Medication list given to you today.  Your Eliquis has been sent to [facility].   *If you need a refill on your cardiac medications before your next appointment, please call your pharmacy*   Lab Work: None If you have labs (blood work) drawn today and your tests are completely normal, you will receive your results only by: [place] (if you have MyChart) OR A paper copy in the mail If you have any lab test that is abnormal or we need to change your treatment, we will call you to review the results.   Testing/Procedures: None   Follow-Up: At [facility], you and your health needs are our priority.  As part of our continuing mission to provide you with exceptional heart care, we have created designated Provider Care Teams.  These Care Teams include your primary Cardiologist (physician) and Advanced Practice Providers (APPs -  Physician Assistants and Nurse Practitioners) who all work together to provide you with the care you need, when you need it.  We recommend signing up for the patient portal called "MyChart".  Sign up information is provided on this After Visit Summary.  MyChart is used to connect with patients for Virtual Visits (Telemedicine).  Patients are able to view lab/test results, encounter notes, upcoming appointments, etc.  Non-urgent messages can be sent to your provider as well.   To learn more about what you can do with MyChart, go to [email or web address].    Your next appointment:   6 month(s)  The format for your next appointment:   In Person  Provider:   You may see Carlyle Dolly, MD or one of the following Advanced Practice Providers on your designated Care Team:   Ermalinda Barrios, PA-C    Other Instructions     Signed, Ermalinda Barrios, PA-C  01/07/2021 2:21 PM    [address],  [place], [place]  [address] Phone: [phone]; Fax: [phone]

## 2021-01-02 ENCOUNTER — Ambulatory Visit: Payer: Medicare Other | Admitting: Internal Medicine

## 2021-01-02 DIAGNOSIS — D225 Melanocytic nevi of trunk: Secondary | ICD-10-CM | POA: Diagnosis not present

## 2021-01-02 DIAGNOSIS — Z1283 Encounter for screening for malignant neoplasm of skin: Secondary | ICD-10-CM | POA: Diagnosis not present

## 2021-01-06 ENCOUNTER — Other Ambulatory Visit: Payer: Self-pay | Admitting: Family Medicine

## 2021-01-06 DIAGNOSIS — K4090 Unilateral inguinal hernia, without obstruction or gangrene, not specified as recurrent: Secondary | ICD-10-CM

## 2021-01-07 ENCOUNTER — Encounter: Payer: Self-pay | Admitting: Physician Assistant

## 2021-01-07 ENCOUNTER — Ambulatory Visit: Payer: Medicare Other | Admitting: Physician Assistant

## 2021-01-07 ENCOUNTER — Other Ambulatory Visit: Payer: Self-pay

## 2021-01-07 VITALS — BP 132/60 | HR 60 | Ht 68.0 in | Wt 148.0 lb

## 2021-01-07 DIAGNOSIS — I350 Nonrheumatic aortic (valve) stenosis: Secondary | ICD-10-CM

## 2021-01-07 DIAGNOSIS — E042 Nontoxic multinodular goiter: Secondary | ICD-10-CM | POA: Diagnosis not present

## 2021-01-07 DIAGNOSIS — I10 Essential (primary) hypertension: Secondary | ICD-10-CM

## 2021-01-07 DIAGNOSIS — M1990 Unspecified osteoarthritis, unspecified site: Secondary | ICD-10-CM

## 2021-01-07 DIAGNOSIS — Z86718 Personal history of other venous thrombosis and embolism: Secondary | ICD-10-CM

## 2021-01-07 DIAGNOSIS — R079 Chest pain, unspecified: Secondary | ICD-10-CM

## 2021-01-07 DIAGNOSIS — I4891 Unspecified atrial fibrillation: Secondary | ICD-10-CM

## 2021-01-07 MED ORDER — LOSARTAN POTASSIUM 25 MG PO TABS: 25.0000 mg | ORAL_TABLET | Freq: Every day | ORAL | 3 refills | Status: DC

## 2021-01-07 MED ORDER — LOSARTAN POTASSIUM 25 MG PO TABS: 25.0000 mg | ORAL_TABLET | Freq: Two times a day (BID) | ORAL | 3 refills | Status: DC

## 2021-01-07 MED ORDER — APIXABAN 5 MG PO TABS: 5.0000 mg | ORAL_TABLET | Freq: Two times a day (BID) | ORAL | 5 refills | Status: DC

## 2021-01-07 NOTE — Patient Instructions (Addendum)
Medication Instructions:  Your physician recommends that you continue on your current medications as directed. Please refer to the Current Medication list given to you today.  Your Eliquis has been sent to [facility].   *If you need a refill on your cardiac medications before your next appointment, please call your pharmacy*   Lab Work: None If you have labs (blood work) drawn today and your tests are completely normal, you will receive your results only by: [facility] (if you have MyChart) OR A paper copy in the mail If you have any lab test that is abnormal or we need to change your treatment, we will call you to review the results.   Testing/Procedures: None   Follow-Up: At [facility], you and your health needs are our priority.  As part of our continuing mission to provide you with exceptional heart care, we have created designated Provider Care Teams.  These Care Teams include your primary Cardiologist (physician) and Advanced Practice Providers (APPs -  Physician Assistants and Nurse Practitioners) who all work together to provide you with the care you need, when you need it.  We recommend signing up for the patient portal called "MyChart".  Sign up information is provided on this After Visit Summary.  MyChart is used to connect with patients for Virtual Visits (Telemedicine).  Patients are able to view lab/test results, encounter notes, upcoming appointments, etc.  Non-urgent messages can be sent to your provider as well.   To learn more about what you can do with MyChart, go to [email or web address].    Your next appointment:   6 month(s)  The format for your next appointment:   In Person  Provider:   You may see Carlyle Dolly, MD or one of the following Advanced Practice Providers on your designated Care Team:   Ermalinda Barrios, PA-C    Other Instructions

## 2021-01-09 ENCOUNTER — Other Ambulatory Visit: Payer: Self-pay

## 2021-01-09 ENCOUNTER — Encounter: Payer: Self-pay | Admitting: General Surgery

## 2021-01-09 ENCOUNTER — Ambulatory Visit: Payer: Medicare Other | Admitting: General Surgery

## 2021-01-09 VITALS — BP 144/73 | HR 52 | Temp 98.1°F | Resp 12 | Ht 68.0 in | Wt 147.0 lb

## 2021-01-09 DIAGNOSIS — K4090 Unilateral inguinal hernia, without obstruction or gangrene, not specified as recurrent: Secondary | ICD-10-CM

## 2021-01-09 NOTE — Progress Notes (Signed)
Kyle [name].; [id number]; 1935-03-09   HPI Patient is an 86 year old white male with multiple medical problems who was referred to my care by Dr. Gerarda Fraction for evaluation and treatment of a left inguinal hernia.  Patient noticed this 3 weeks ago when showering.  It is sometimes uncomfortable when it sticking out, but it does resolve with lying down.  He denies any nausea or vomiting.  He was recently diagnosed with new onset atrial fibrillation and has been started on Eliquis.  He states the hernia is not affecting his daily lifestyle at the present time. Past Medical History:  Diagnosis Date   Arthritis    Auto immune neutropenia (HCC)    Bullous pemphigoid    Cancer (HCC)    Prostate   Cervical spondylosis without myelopathy 79/48/0165   Complication of anesthesia    difficult to wake up after anesthesia   DVT (deep venous thrombosis) ([place])    a. Has had a previous right lower extremity DVT in the setting of hospitalization and surgery (after his brain tumor was resected in 1993) but is no longer on Coumadin.   Dyslipidemia    History of prostate cancer    Hypertension    Meningioma ([place])    a. s/p resection 1990s.   Myasthenia ([place]) 02/14/2013   Nocturnal leg cramps    Ocular myasthenia gravis ([place])    Peripheral neuropathy 11/23/2018   Seizures ([place])    Sinus bradycardia    Sleep paralysis    Thyroid nodule, cold     Past Surgical History:  Procedure Laterality Date   BLADDER SURGERY     removed part of the bladder   BRAIN SURGERY     CARDIOVERSION N/A 11/07/2020   Procedure: CARDIOVERSION;  Surgeon: Arnoldo Lenis, MD;  Location: [facility];  Service: Endoscopy;  Laterality: N/A;   CHOLECYSTECTOMY     COLONOSCOPY N/A 07/25/2013   Procedure: COLONOSCOPY;  Surgeon: Jamesetta So, MD;  Location: [facility];  Service: Gastroenterology;  Laterality: N/A;   COLONOSCOPY N/A 11/10/2016   Procedure: COLONOSCOPY;  Surgeon: Aviva Signs, MD;  Location: [facility];   Service: Gastroenterology;  Laterality: N/A;   IR CATHETER TUBE CHANGE  01/02/2017   IR RADIOLOGIST EVAL & MGMT  01/06/2017   IR RADIOLOGIST EVAL & MGMT  01/20/2017   IR RADIOLOGIST EVAL & MGMT  02/03/2017   IR RADIOLOGIST EVAL & MGMT  02/17/2017   IR SINUS/FIST TUBE CHK-NON GI  03/03/2017   PARTIAL COLECTOMY     RADIOACTIVE SEED IMPLANT      Family History  Problem Relation Age of Onset   Heart failure Mother    Diabetes Mother    Diabetes Sister    Dementia Sister    Lung cancer Son    Heart failure Daughter    Colon cancer Neg Hx     Current Outpatient Medications on File Prior to Visit  Medication Sig Dispense Refill   acetaminophen (TYLENOL) 325 MG tablet Take 2 tablets (650 mg total) by mouth every 6 (six) hours as needed for mild pain (or Fever >/= 101). 12 tablet 4   apixaban (ELIQUIS) 5 MG TABS tablet Take 1 tablet (5 mg total) by mouth 2 (two) times daily. 60 tablet 5   Cholecalciferol (VITAMIN D) 50 MCG (2000 UT) tablet Take 8,000 Units by mouth daily.     clobetasol cream (TEMOVATE) 5.37 % Apply 1 application topically 2 (two) times daily as needed (blisters).     fish  oil-omega-3 fatty acids 1000 MG capsule Take 1 g by mouth 3 (three) times daily.     gabapentin (NEURONTIN) 600 MG tablet TAKE 1 TABLET BY MOUTH  DAILY 90 tablet 3   Garlic (GARLIQUE PO) Take 1 tablet by mouth daily.     levETIRAcetam (KEPPRA) 500 MG tablet Take 1/2 tablet in the morning, take 1 tablet in the evening 135 tablet 3   losartan (COZAAR) 25 MG tablet Take 1 tablet (25 mg total) by mouth in the morning and at bedtime. 90 tablet 3   magnesium oxide (MAG-OX) 400 MG tablet Take 400 mg by mouth at bedtime.     niacinamide 500 MG tablet Take 500 mg by mouth 3 (three) times daily.     oxymetazoline (AFRIN) 0.05 % nasal spray Place 1 spray into both nostrils 2 (two) times daily as needed for congestion.     Polyethyl Glycol-Propyl Glycol 0.4-0.3 % SOLN Place 1 drop into both eyes 3 (three) times  daily as needed (for dry eyes).     pyridostigmine (MESTINON) 60 MG tablet TAKE 1 AND 1/2 TABLETS BY  MOUTH 4 TIMES DAILY 540 tablet 3   simvastatin (ZOCOR) 20 MG tablet Take 20 mg by mouth at bedtime.     tiZANidine (ZANAFLEX) 4 MG tablet Take 4 mg by mouth at bedtime.     No current facility-administered medications on file prior to visit.    Allergies  Allergen Reactions   Iodinated Diagnostic Agents Hives     pt was premedicated/hives reaction occurred 24 hours post injection, Onset Date: [id number]    Sulfa Antibiotics Swelling and Rash   Bactericin [Bacitracin] Other (See Comments)    Unknown reaction     Social History   Substance and Sexual Activity  Alcohol Use No    Social History   Tobacco Use  Smoking Status Never  Smokeless Tobacco Never    Review of Systems  Constitutional: Negative.   HENT: Negative.    Eyes: Negative.   Respiratory: Negative.    Cardiovascular: Negative.   Gastrointestinal: Negative.   Genitourinary:  Positive for frequency and urgency.  Musculoskeletal:  Positive for back pain, joint pain and neck pain.  Skin: Negative.   Neurological: Negative.   Endo/Heme/Allergies:  Bruises/bleeds easily.  Psychiatric/Behavioral: Negative.     Objective   Vitals:   01/09/21 0951  BP: (!) 144/73  Pulse: (!) 52  Resp: 12  Temp: 98.1 F (36.7 C)  SpO2: 92%    Physical Exam Vitals reviewed.  Constitutional:      Appearance: Normal appearance. He is not ill-appearing.  HENT:     Head: Normocephalic and atraumatic.  Cardiovascular:     Rate and Rhythm: Normal rate and regular rhythm.     Heart sounds: Normal heart sounds. No murmur heard.   No gallop.  Pulmonary:     Effort: Pulmonary effort is normal. No respiratory distress.     Breath sounds: Normal breath sounds. No stridor. No wheezing, rhonchi or rales.  Abdominal:     General: Bowel sounds are normal.     Palpations: Abdomen is soft. There is no mass.     Tenderness: There is  no abdominal tenderness. There is no guarding or rebound.     Hernia: A hernia is present.     Comments: Easily reducible left inguinal hernia  Genitourinary:    Testes: Normal.  Skin:    General: Skin is warm and dry.  Neurological:     Mental Status: He  is alert and oriented to person, place, and time.  Cardiology notes reviewed.  Primary care notes reviewed.  Of note is the fact that the patient has moderate aortic stenosis.  Assessment  Left inguinal hernia, currently asymptomatic Multiple cardiac issues including new onset atrial fibrillation, though he is in normal sinus rhythm to my examination.  He is on Eliquis.  He also has a history of aortic stenosis. Plan  Patient is at higher risk for any surgical intervention.  I do not think he requires a left inguinal herniorrhaphy at this time.  Patient understands and agrees.  The patient was told to return to my care should he become more symptomatic.  Literature was given concerning the signs and symptoms of incarceration.  Follow-up as needed.

## 2021-02-10 DIAGNOSIS — H04123 Dry eye syndrome of bilateral lacrimal glands: Secondary | ICD-10-CM | POA: Diagnosis not present

## 2021-02-12 DIAGNOSIS — M62830 Muscle spasm of back: Secondary | ICD-10-CM | POA: Diagnosis not present

## 2021-02-12 DIAGNOSIS — M9905 Segmental and somatic dysfunction of pelvic region: Secondary | ICD-10-CM | POA: Diagnosis not present

## 2021-02-12 DIAGNOSIS — M9902 Segmental and somatic dysfunction of thoracic region: Secondary | ICD-10-CM | POA: Diagnosis not present

## 2021-02-12 DIAGNOSIS — M9903 Segmental and somatic dysfunction of lumbar region: Secondary | ICD-10-CM | POA: Diagnosis not present

## 2021-02-17 DIAGNOSIS — M9905 Segmental and somatic dysfunction of pelvic region: Secondary | ICD-10-CM | POA: Diagnosis not present

## 2021-02-17 DIAGNOSIS — M9902 Segmental and somatic dysfunction of thoracic region: Secondary | ICD-10-CM | POA: Diagnosis not present

## 2021-02-17 DIAGNOSIS — M9903 Segmental and somatic dysfunction of lumbar region: Secondary | ICD-10-CM | POA: Diagnosis not present

## 2021-02-17 DIAGNOSIS — M62830 Muscle spasm of back: Secondary | ICD-10-CM | POA: Diagnosis not present

## 2021-03-27 DIAGNOSIS — L570 Actinic keratosis: Secondary | ICD-10-CM | POA: Diagnosis not present

## 2021-03-27 DIAGNOSIS — X32XXXD Exposure to sunlight, subsequent encounter: Secondary | ICD-10-CM | POA: Diagnosis not present

## 2021-03-27 DIAGNOSIS — C44329 Squamous cell carcinoma of skin of other parts of face: Secondary | ICD-10-CM | POA: Diagnosis not present

## 2021-04-01 ENCOUNTER — Other Ambulatory Visit: Payer: Self-pay | Admitting: Neurological Surgery

## 2021-04-01 DIAGNOSIS — M4316 Spondylolisthesis, lumbar region: Secondary | ICD-10-CM | POA: Diagnosis not present

## 2021-04-08 ENCOUNTER — Telehealth: Payer: Self-pay | Admitting: Physician Assistant

## 2021-04-08 MED ORDER — APIXABAN 5 MG PO TABS: 5.0000 mg | ORAL_TABLET | Freq: Two times a day (BID) | ORAL | 5 refills | Status: DC

## 2021-04-08 NOTE — Addendum Note (Signed)
Addended by: Malen Gauze on: 04/08/2021 01:01 PM   Modules accepted: Orders

## 2021-04-08 NOTE — Telephone Encounter (Signed)
Prescription refill request for Eliquis received. Indication: A Fib/flutter Last office visit: 01/07/21  Gala Lewandowsky PA-C Scr: 0.93 on 11/05/20 Age: 86 Weight: 67.1kg  Based on above findings Eliquis 5mg  twice daily is the appropriate dose.  Refill approved.

## 2021-04-08 NOTE — Telephone Encounter (Signed)
This pt walked in a wants his refill for 30 day suppley for eliquis sent to [facility]. He said that the [facility] day supply was to high.

## 2021-04-22 NOTE — Progress Notes (Signed)
PATIENT: Kyle [name]. DOB: Sep 29, 1934  REASON FOR VISIT: follow up neuropathy, seizures, myasthenia gravis HISTORY FROM: patient Primary Neurologist: Dr. Krista Blue since Dr. Jannifer Franklin retired  HISTORY OF PRESENT ILLNESS: Today 04/23/21 Kyle [name] is here today for follow up. Saw Dr. Ronnald Ramp, neurosurgery, having MRI Lumbar Spine on Saturday, may consider surgery. In past saw doctor at [facility], did several rounds of injections in the back without benefit. Tried Restaurant manager, fast food, acupuncture. Back pain when standing, has to stand a few seconds before can walk. Is ok when sitting and lying, can't bend over. Had AFIB, on Eliquis now, had cardioversion. Was on diclofenac for arthritis in joints, stopped due to blood thinner, taking extra strength tylenol, doesn't work as well as diclofenac. Since stopped, he does feel like his body aches, makes him feel weaker. Taking Mestinon 60 mg 1.5 4 times daily, no double vision, some diarrhea, not daily, triggered by diet, does have diverticulitis, multiple factors, his bowels are unpredictable. No seizures, on Keppra. On gabapentin at night for neuropathy at night, sleeps fairly well. Takes Tizanidine at night PRN for leg cramps. Has not fallen. Remains active works in yard, garden, had someone help with his mulch, had smaller garden, not cutting as many lawns. Here today alone.   Update 08/21/2020 SS:  Kyle [name] is an 87 year old male with history of:  Ocular myasthenia gravis, only taking Mestinon.  Denies any double vision, sometimes his wife says his left eye droops.  He does not notice this.  Does okay with Mestinon, has occasional diarrhea, feels mostly related to his surgery from diverticulitis.  History of seizures, on Keppra, no seizures in 18 years.  He has peripheral neuropathy, takes gabapentin.  He has nocturnal leg cramps.  PCP switched from baclofen to tizanidine.  Working fairly well.  Still has night of nocturnal cramps.  The neuropathy is  not especially bothersome, denies numbness, or pain to the feet.  Takes magnesium at lunch.  For chronic back pain, reports had MRI at [facility].  Saw Dr. Ronnald Ramp with neurosurgery.  Surgery was offered, ultimately he decided to do nothing.  Is seeing a chiropractor once a week, has helped his neck.  Remains active, lives with his wife, drives a car.  Works in the yard, has a garden.  Wife tells him he needs to slow down, he says yes, but not today.  Here today for follow-up unaccompanied.  Update 11/22/2019 SS: Kyle [name] is an 87 year old male with history of ocular myasthenia gravis, only taking Mestinon.  History of seizures, on Keppra, no seizures in 18 years.  He has peripheral neuropathy, takes gabapentin.  He has nocturnal leg cramps, on baclofen.  A few months ago, developed left hip pain and back pain, went to [facility], had a series of injections without benefit, reports MRI of the lumbar spine, showed spinal stenosis.  A referral to neurosurgery has been initiated, pending appointment. He thinks he will decide to do nothing for now.  Over time, has noted weakness in both legs.  He remains quite active, working in the yard.  No falls. Still deals with nocturnal leg cramps, will wake during the night with painful cramps, will get up and walk around, take a spoon full of mustard or vinegar with good benefit.  Denies blurred vision or double vision. Presents today for evaluation unaccompanied.   HISTORY 02/22/2019 Dr. Jannifer Franklin: Kyle [name] is a an 87 year old right-handed white male with a history of ocular myasthenia gravis treated only  with Mestinon.  He is doing quite well with this since the recent increase in dose of the Mestinon.  He has not had any seizures in 17 years, he currently is on Keppra and tolerates the medication well.  He does have some numbness in the feet, he was diagnosed with a peripheral neuropathy recently, he takes gabapentin only in the evenings which has helped his  ability to rest and sleep.  The patient continues to have nocturnal leg cramps, he is not sure that the increase in the Mestinon worsened this.  He will have cramps in the feet or in the calf muscles of either leg about 3 or 4 nights out of the week.  He has to get up and stretch, he sometimes will take mustard for this.  The patient denies any significant balance issues, he has not had any falls.  He returns to the office today for an evaluation.   REVIEW OF SYSTEMS: Out of a complete 14 system review of symptoms, the patient complains only of the following symptoms, and all other reviewed systems are negative.  See HPI  ALLERGIES: Allergies  Allergen Reactions   Iodinated Contrast Media Hives     pt was premedicated/hives reaction occurred 24 hours post injection, Onset Date: 24401027    Sulfa Antibiotics Swelling and Rash   Bactericin [Bacitracin] Other (See Comments)    Unknown reaction     HOME MEDICATIONS: Outpatient Medications Prior to Visit  Medication Sig Dispense Refill   acetaminophen (TYLENOL) 325 MG tablet Take 2 tablets (650 mg total) by mouth every 6 (six) hours as needed for mild pain (or Fever >/= 101). 12 tablet 4   apixaban (ELIQUIS) 5 MG TABS tablet Take 1 tablet (5 mg total) by mouth 2 (two) times daily. 60 tablet 5   Cholecalciferol (VITAMIN D) 50 MCG (2000 UT) tablet Take 8,000 Units by mouth daily.     clobetasol cream (TEMOVATE) 2.53 % Apply 1 application topically 2 (two) times daily as needed (blisters).     fish oil-omega-3 fatty acids 1000 MG capsule Take 1 g by mouth 3 (three) times daily.     gabapentin (NEURONTIN) 600 MG tablet TAKE 1 TABLET BY MOUTH  DAILY 90 tablet 3   Garlic (GARLIQUE PO) Take 1 tablet by mouth daily.     levETIRAcetam (KEPPRA) 500 MG tablet Take 1/2 tablet in the morning, take 1 tablet in the evening 135 tablet 3   losartan (COZAAR) 25 MG tablet Take 1 tablet (25 mg total) by mouth in the morning and at bedtime. 90 tablet 3    niacinamide 500 MG tablet Take 500 mg by mouth 3 (three) times daily.     oxymetazoline (AFRIN) 0.05 % nasal spray Place 1 spray into both nostrils 2 (two) times daily as needed for congestion.     Polyethyl Glycol-Propyl Glycol 0.4-0.3 % SOLN Place 1 drop into both eyes 3 (three) times daily as needed (for dry eyes).     pyridostigmine (MESTINON) 60 MG tablet TAKE 1 AND 1/2 TABLETS BY  MOUTH 4 TIMES DAILY 540 tablet 3   tiZANidine (ZANAFLEX) 4 MG tablet Take 4 mg by mouth at bedtime.     magnesium oxide (MAG-OX) 400 MG tablet Take 400 mg by mouth at bedtime.     simvastatin (ZOCOR) 20 MG tablet Take 20 mg by mouth at bedtime.     No facility-administered medications prior to visit.    PAST MEDICAL HISTORY: Past Medical History:  Diagnosis Date  Arthritis    Auto immune neutropenia (HCC)    Bullous pemphigoid    Cancer (HCC)    Prostate   Cervical spondylosis without myelopathy 40/98/1191   Complication of anesthesia    difficult to wake up after anesthesia   DVT (deep venous thrombosis) ([place])    a. Has had a previous right lower extremity DVT in the setting of hospitalization and surgery (after his brain tumor was resected in 1993) but is no longer on Coumadin.   Dyslipidemia    History of prostate cancer    Hypertension    Meningioma ([place])    a. s/p resection 1990s.   Myasthenia ([place]) 02/14/2013   Nocturnal leg cramps    Ocular myasthenia gravis ([place])    Peripheral neuropathy 11/23/2018   Seizures ([place])    Sinus bradycardia    Sleep paralysis    Thyroid nodule, cold     PAST SURGICAL HISTORY: Past Surgical History:  Procedure Laterality Date   BLADDER SURGERY     removed part of the bladder   BRAIN SURGERY     CARDIOVERSION N/A 11/07/2020   Procedure: CARDIOVERSION;  Surgeon: Arnoldo Lenis, MD;  Location: [facility];  Service: Endoscopy;  Laterality: N/A;   CHOLECYSTECTOMY     COLONOSCOPY N/A 07/25/2013   Procedure: COLONOSCOPY;  Surgeon: Jamesetta So, MD;   Location: [facility];  Service: Gastroenterology;  Laterality: N/A;   COLONOSCOPY N/A 11/10/2016   Procedure: COLONOSCOPY;  Surgeon: Aviva Signs, MD;  Location: [facility];  Service: Gastroenterology;  Laterality: N/A;   IR CATHETER TUBE CHANGE  01/02/2017   IR RADIOLOGIST EVAL & MGMT  01/06/2017   IR RADIOLOGIST EVAL & MGMT  01/20/2017   IR RADIOLOGIST EVAL & MGMT  02/03/2017   IR RADIOLOGIST EVAL & MGMT  02/17/2017   IR SINUS/FIST TUBE CHK-NON GI  03/03/2017   PARTIAL COLECTOMY     RADIOACTIVE SEED IMPLANT      FAMILY HISTORY: Family History  Problem Relation Age of Onset   Heart failure Mother    Diabetes Mother    Diabetes Sister    Dementia Sister    Lung cancer Son    Heart failure Daughter    Colon cancer Neg Hx     SOCIAL HISTORY: Social History   Socioeconomic History   Marital status: Married    Spouse name: Not on file   Number of children: 1   Years of education: 18 TH   Highest education level: Not on file  Occupational History   Occupation: retired  Tobacco Use   Smoking status: Never   Smokeless tobacco: Never  Substance and Sexual Activity   Alcohol use: No   Drug use: No   Sexual activity: Not on file  Other Topics Concern   Not on file  Social History Narrative   Patient is right handed.   Patient drinks 1 cup of coffee daily.   Social Determinants of Health   Financial Resource Strain: Not on file  Food Insecurity: Not on file  Transportation Needs: Not on file  Physical Activity: Not on file  Stress: Not on file  Social Connections: Not on file  Intimate Partner Violence: Not on file   PHYSICAL EXAM  Vitals:   04/23/21 0706  BP: 133/68  Pulse: (!) 55  Weight: 150 lb 8 oz (68.3 kg)  Height: 5\' 8"  (1.727 m)    Body mass index is 22.88 kg/m.  Generalized: Well developed, in no acute distress  Neurological examination  Mentation: Alert oriented to time, place, history taking. Follows all commands speech and language  fluent, very pleasant  Cranial nerve II-XII: Pupils were equal round reactive to light. Extraocular movements were full, visual field were full on confrontational test. Facial sensation and strength were normal. Head turning and shoulder shrug  were normal and symmetric.  No ptosis noted. With superior gaze for 1 minute there is no reported diplopia or ptosis. There is mild left eye open weakness. Motor: Good strength all extremities, no muscle weakness noted Sensory: Sensory testing is intact to soft touch on all 4 extremities. No evidence of extinction is noted.  Coordination: Cerebellar testing reveals good finger-nose-finger and heel-to-shin bilaterally.  Gait and station: Has to push off to stand, once standing waits for few seconds before initiating gait, is antalgic, slightly wide based, no assistive devices Reflexes: Deep tendon reflexes are symmetric and normal  DIAGNOSTIC DATA (LABS, IMAGING, TESTING) - I reviewed patient records, labs, notes, testing and imaging myself where available.  Lab Results  Component Value Date   WBC 4.6 10/14/2020   HGB 13.0 10/14/2020   HCT 37.8 (L) 10/14/2020   MCV 93.1 10/14/2020   PLT 107 (L) 10/14/2020      Component Value Date/Time   NA 136 11/05/2020 1050   K 4.2 11/05/2020 1050   CL 102 11/05/2020 1050   CO2 25 11/05/2020 1050   GLUCOSE 112 (H) 11/05/2020 1050   BUN 23 11/05/2020 1050   CREATININE 0.93 11/05/2020 1050   CALCIUM 9.6 11/05/2020 1050   PROT 6.2 (L) 10/14/2020 0442   PROT 6.9 11/23/2018 1518   ALBUMIN 3.0 (L) 10/14/2020 0442   AST 128 (H) 10/14/2020 0442   ALT 117 (H) 10/14/2020 0442   ALKPHOS 73 10/14/2020 0442   BILITOT 1.0 10/14/2020 0442   GFRNONAA >60 11/05/2020 1050   GFRAA >60 03/03/2017 1024   Lab Results  Component Value Date   CHOL 182 10/26/2014   HDL 67 10/26/2014   LDLCALC 89 10/26/2014   TRIG 130 10/26/2014   CHOLHDL 2.7 10/26/2014   Lab Results  Component Value Date   HGBA1C 5.9 (H) 10/25/2014    Lab Results  Component Value Date   VITAMINB12 292 11/23/2018   Lab Results  Component Value Date   TSH 2.438 10/10/2020   ASSESSMENT AND PLAN 87 y.o. year old male  has a past medical history of Arthritis, Auto immune neutropenia ([place]), Bullous pemphigoid, Cancer ([place]), Cervical spondylosis without myelopathy (42/87/6811), Complication of anesthesia, DVT (deep venous thrombosis) ([place]), Dyslipidemia, History of prostate cancer, Hypertension, Meningioma ([place]), Myasthenia ([place]) (02/14/2013), Nocturnal leg cramps, Ocular myasthenia gravis ([place]), Peripheral neuropathy (11/23/2018), Seizures ([place]), Sinus bradycardia, Sleep paralysis, and Thyroid nodule, cold. here with:  1. Peripheral neuropathy -Under good control -Continue gabapentin 600 mg at bedtime  2. Ocular myasthenia gravis -Stable, is faithful to take Mestinon, no double vision  -Continue Mestinon 60 mg, 1.5 tablets 4 times daily  3. History of seizures -No recurrent seizure in several years -Continue Keppra 500 mg, 1/2 tablet in the morning, 1 tablet in the evening  4. Nocturnal leg cramps -doing fairly well, on tizanidine PRN (stopped baclofen)  5. Reported lumbar stenosis -Has seen Dr. Ronnald Ramp, neurosurgery -MRI Lumbar Spine is scheduled this week  -May consider surgery, having more low back pain, has failed conservative options  -Return back in 6 months to see Dr. Krista Blue or sooner if needed, will follow with Dr. Krista Blue since Dr. Jannifer Franklin has retired  Butler Denmark, AGNP-C, DNP 04/23/2021, 7:39 AM [facility] [address], [place], [place] [address] [phone]

## 2021-04-23 ENCOUNTER — Encounter: Payer: Self-pay | Admitting: Neurology

## 2021-04-23 ENCOUNTER — Ambulatory Visit: Payer: Medicare Other | Admitting: Neurology

## 2021-04-23 VITALS — BP 133/68 | HR 55 | Ht 68.0 in | Wt 150.5 lb

## 2021-04-23 DIAGNOSIS — G609 Hereditary and idiopathic neuropathy, unspecified: Secondary | ICD-10-CM | POA: Diagnosis not present

## 2021-04-23 DIAGNOSIS — G7000 Myasthenia gravis without (acute) exacerbation: Secondary | ICD-10-CM | POA: Diagnosis not present

## 2021-04-23 DIAGNOSIS — R569 Unspecified convulsions: Secondary | ICD-10-CM

## 2021-04-23 DIAGNOSIS — G4762 Sleep related leg cramps: Secondary | ICD-10-CM | POA: Diagnosis not present

## 2021-04-23 MED ORDER — LEVETIRACETAM 500 MG PO TABS: ORAL_TABLET | ORAL | 3 refills | Status: DC

## 2021-04-23 MED ORDER — GABAPENTIN 600 MG PO TABS: 600.0000 mg | ORAL_TABLET | Freq: Every day | ORAL | 3 refills | Status: DC

## 2021-04-23 MED ORDER — PYRIDOSTIGMINE BROMIDE 60 MG PO TABS: ORAL_TABLET | ORAL | 3 refills | Status: DC

## 2021-04-23 NOTE — Patient Instructions (Signed)
Continue current medications  Let me know about your plans for any back surgery  See you back in 8 months  Call for any issues

## 2021-04-26 ENCOUNTER — Other Ambulatory Visit: Payer: Self-pay

## 2021-04-26 ENCOUNTER — Ambulatory Visit
Admission: RE | Admit: 2021-04-26 | Discharge: 2021-04-26 | Disposition: A | Discharge status: Home / Self Care | Payer: Medicare Other | Source: Ambulatory Visit | Attending: Neurological Surgery | Admitting: Neurological Surgery

## 2021-04-26 DIAGNOSIS — R29898 Other symptoms and signs involving the musculoskeletal system: Secondary | ICD-10-CM | POA: Diagnosis not present

## 2021-04-26 DIAGNOSIS — M4316 Spondylolisthesis, lumbar region: Secondary | ICD-10-CM | POA: Diagnosis not present

## 2021-04-26 DIAGNOSIS — M545 Low back pain, unspecified: Secondary | ICD-10-CM | POA: Diagnosis not present

## 2021-04-26 DIAGNOSIS — M47816 Spondylosis without myelopathy or radiculopathy, lumbar region: Secondary | ICD-10-CM | POA: Diagnosis not present

## 2021-04-26 DIAGNOSIS — M48061 Spinal stenosis, lumbar region without neurogenic claudication: Secondary | ICD-10-CM | POA: Diagnosis not present

## 2021-04-29 NOTE — Progress Notes (Signed)
Chart reviewed, agree above plan ?

## 2021-05-01 DIAGNOSIS — Z85828 Personal history of other malignant neoplasm of skin: Secondary | ICD-10-CM | POA: Diagnosis not present

## 2021-05-01 DIAGNOSIS — Z08 Encounter for follow-up examination after completed treatment for malignant neoplasm: Secondary | ICD-10-CM | POA: Diagnosis not present

## 2021-05-05 DIAGNOSIS — E042 Nontoxic multinodular goiter: Secondary | ICD-10-CM | POA: Diagnosis not present

## 2021-05-06 LAB — T3, FREE: T3, Free: 2.8 pg/mL (ref 2.0–4.4)

## 2021-05-06 LAB — TSH: TSH: 1.97 u[IU]/mL (ref 0.450–4.500)

## 2021-05-06 LAB — T4, FREE: Free T4: 1.2 ng/dL (ref 0.82–1.77)

## 2021-05-15 ENCOUNTER — Ambulatory Visit: Payer: Medicare Other | Admitting: "Endocrinology

## 2021-05-15 ENCOUNTER — Encounter: Payer: Self-pay | Admitting: "Endocrinology

## 2021-05-15 ENCOUNTER — Other Ambulatory Visit: Payer: Self-pay

## 2021-05-15 VITALS — BP 116/58 | HR 52 | Ht 68.0 in | Wt 154.2 lb

## 2021-05-15 DIAGNOSIS — E042 Nontoxic multinodular goiter: Secondary | ICD-10-CM | POA: Diagnosis not present
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
## 2021-05-22 DIAGNOSIS — M4316 Spondylolisthesis, lumbar region: Secondary | ICD-10-CM | POA: Diagnosis not present

## 2021-05-22 DIAGNOSIS — I10 Essential (primary) hypertension: Secondary | ICD-10-CM | POA: Diagnosis not present

## 2021-05-22 DIAGNOSIS — M4126 Other idiopathic scoliosis, lumbar region: Secondary | ICD-10-CM | POA: Diagnosis not present

## 2021-05-29 ENCOUNTER — Encounter: Payer: Self-pay | Admitting: General Surgery

## 2021-05-29 ENCOUNTER — Ambulatory Visit: Payer: Medicare Other | Admitting: General Surgery

## 2021-05-29 ENCOUNTER — Other Ambulatory Visit: Payer: Self-pay

## 2021-05-29 VITALS — BP 139/72 | HR 52 | Temp 98.7°F | Resp 12 | Ht 68.0 in | Wt 154.0 lb

## 2021-05-29 DIAGNOSIS — K4090 Unilateral inguinal hernia, without obstruction or gangrene, not specified as recurrent: Secondary | ICD-10-CM | POA: Diagnosis not present

## 2021-05-29 NOTE — Progress Notes (Signed)
Kyle [name].; [id number]; May 19, 1934   HPI Patient is an 87 year old white male who was referred back to my care by Dr. Gerarda Fraction for evaluation of a known left inguinal hernia.  I last saw the patient in September 2022.  He states that the hernia is still reducible when he lies down.  He has had significant back issues for which she has seen multiple physicians.  He is not felt to be an operative candidate and is going back to an orthopedic surgeon for possible ongoing physical therapy.  His gait is abnormal and it does aggravate his left groin region.  He has not had an episode of incarceration.  He is on Eliquis for paroxysmal atrial fibrillation. Past Medical History:  Diagnosis Date   Arthritis    Atrial fibrillation (HCC)    Auto immune neutropenia (HCC)    Bullous pemphigoid    Cancer (HCC)    Prostate   Cervical spondylosis without myelopathy 37/16/9678   Complication of anesthesia    difficult to wake up after anesthesia   DVT (deep venous thrombosis) ([place])    a. Has had a previous right lower extremity DVT in the setting of hospitalization and surgery (after his brain tumor was resected in 1993) but is no longer on Coumadin.   Dyslipidemia    History of prostate cancer    Hypertension    Meningioma ([place])    a. s/p resection 1990s.   Myasthenia ([place]) 02/14/2013   Nocturnal leg cramps    Ocular myasthenia gravis ([place])    Peripheral neuropathy 11/23/2018   Seizures ([place])    Sinus bradycardia    Sleep paralysis    Thyroid nodule, cold     Past Surgical History:  Procedure Laterality Date   BLADDER SURGERY     removed part of the bladder   BRAIN SURGERY     CARDIOVERSION N/A 11/07/2020   Procedure: CARDIOVERSION;  Surgeon: Arnoldo Lenis, MD;  Location: [facility];  Service: Endoscopy;  Laterality: N/A;   CHOLECYSTECTOMY     COLONOSCOPY N/A 07/25/2013   Procedure: COLONOSCOPY;  Surgeon: Jamesetta So, MD;  Location: [facility];  Service: Gastroenterology;   Laterality: N/A;   COLONOSCOPY N/A 11/10/2016   Procedure: COLONOSCOPY;  Surgeon: Aviva Signs, MD;  Location: [facility];  Service: Gastroenterology;  Laterality: N/A;   IR CATHETER TUBE CHANGE  01/02/2017   IR RADIOLOGIST EVAL & MGMT  01/06/2017   IR RADIOLOGIST EVAL & MGMT  01/20/2017   IR RADIOLOGIST EVAL & MGMT  02/03/2017   IR RADIOLOGIST EVAL & MGMT  02/17/2017   IR SINUS/FIST TUBE CHK-NON GI  03/03/2017   PARTIAL COLECTOMY     RADIOACTIVE SEED IMPLANT      Family History  Problem Relation Age of Onset   Heart failure Mother    Diabetes Mother    Diabetes Sister    Dementia Sister    Lung cancer Son    Heart failure Daughter    Colon cancer Neg Hx     Current Outpatient Medications on File Prior to Visit  Medication Sig Dispense Refill   acetaminophen (TYLENOL) 325 MG tablet Take 2 tablets (650 mg total) by mouth every 6 (six) hours as needed for mild pain (or Fever >/= 101). 12 tablet 4   apixaban (ELIQUIS) 5 MG TABS tablet Take 1 tablet (5 mg total) by mouth 2 (two) times daily. 60 tablet 5   Cholecalciferol (VITAMIN D) 50 MCG (2000 UT) tablet Take 8,000  Units by mouth daily.     clobetasol cream (TEMOVATE) 4.49 % Apply 1 application topically 2 (two) times daily as needed (blisters).     fish oil-omega-3 fatty acids 1000 MG capsule Take 1 g by mouth 3 (three) times daily.     gabapentin (NEURONTIN) 600 MG tablet Take 1 tablet (600 mg total) by mouth daily. 90 tablet 3   Garlic (GARLIQUE PO) Take 1 tablet by mouth daily.     levETIRAcetam (KEPPRA) 500 MG tablet Take 1/2 tablet in the morning, take 1 tablet in the evening 135 tablet 3   losartan (COZAAR) 25 MG tablet Take 1 tablet (25 mg total) by mouth in the morning and at bedtime. 90 tablet 3   niacinamide 500 MG tablet Take 500 mg by mouth 3 (three) times daily.     oxymetazoline (AFRIN) 0.05 % nasal spray Place 1 spray into both nostrils 2 (two) times daily as needed for congestion.     Polyethyl Glycol-Propyl  Glycol 0.4-0.3 % SOLN Place 1 drop into both eyes 3 (three) times daily as needed (for dry eyes).     pyridostigmine (MESTINON) 60 MG tablet TAKE 1 AND 1/2 TABLETS BY  MOUTH 4 TIMES DAILY 540 tablet 3   tiZANidine (ZANAFLEX) 4 MG tablet Take 4 mg by mouth at bedtime.     No current facility-administered medications on file prior to visit.    Allergies  Allergen Reactions   Iodinated Contrast Media Hives     pt was premedicated/hives reaction occurred 24 hours post injection, Onset Date: [id number]    Sulfa Antibiotics Swelling and Rash   Bactericin [Bacitracin] Other (See Comments)    Unknown reaction     Social History   Substance and Sexual Activity  Alcohol Use No    Social History   Tobacco Use  Smoking Status Never   Passive exposure: Never  Smokeless Tobacco Never    Review of Systems  Constitutional: Negative.   HENT: Negative.    Eyes:  Positive for pain.  Respiratory: Negative.    Cardiovascular: Negative.   Gastrointestinal: Negative.   Genitourinary:  Positive for urgency.  Musculoskeletal:  Positive for back pain.  Skin: Negative.   Neurological: Negative.   Endo/Heme/Allergies: Negative.   Psychiatric/Behavioral: Negative.     Objective   Vitals:   05/29/21 0946  BP: 139/72  Pulse: (!) 52  Resp: 12  Temp: 98.7 F (37.1 C)  SpO2: 97%    Physical Exam Vitals reviewed.  Constitutional:      Appearance: Normal appearance. He is not ill-appearing.  HENT:     Head: Normocephalic and atraumatic.  Cardiovascular:     Rate and Rhythm: Normal rate and regular rhythm.     Heart sounds: Normal heart sounds. No murmur heard.   No friction rub. No gallop.  Pulmonary:     Effort: Pulmonary effort is normal. No respiratory distress.     Breath sounds: Normal breath sounds. No stridor. No wheezing, rhonchi or rales.  Abdominal:     General: Bowel sounds are normal. There is no distension.     Palpations: Abdomen is soft. There is no mass.      Tenderness: There is no abdominal tenderness. There is no guarding or rebound.     Hernia: A hernia is present.     Comments: Easily reducible left inguinal hernia.  Genitourinary:    Testes: Normal.  Skin:    General: Skin is warm and dry.  Neurological:  Mental Status: He is alert and oriented to person, place, and time.   Previous notes reviewed Assessment  Left inguinal hernia, asymptomatic at this time. Multiple comorbidities including chronic anticoagulation and back pain Plan  I told the patient that I do not recommend a left inguinal herniorrhaphy at this time.  He has multiple comorbidities and I feel that overall, it will not be helpful.  He understands this.  Should he have episodes of incarceration, then we may be forced to fix the hernia.  Follow-up here as needed.

## 2021-06-12 DIAGNOSIS — M47896 Other spondylosis, lumbar region: Secondary | ICD-10-CM | POA: Diagnosis not present

## 2021-06-12 DIAGNOSIS — I4891 Unspecified atrial fibrillation: Secondary | ICD-10-CM | POA: Diagnosis not present

## 2021-06-12 DIAGNOSIS — M5136 Other intervertebral disc degeneration, lumbar region: Secondary | ICD-10-CM | POA: Diagnosis not present

## 2021-06-12 DIAGNOSIS — D6869 Other thrombophilia: Secondary | ICD-10-CM | POA: Diagnosis not present

## 2021-06-24 DIAGNOSIS — M47816 Spondylosis without myelopathy or radiculopathy, lumbar region: Secondary | ICD-10-CM | POA: Diagnosis not present

## 2021-07-07 ENCOUNTER — Encounter: Payer: Self-pay | Admitting: Cardiology

## 2021-07-07 ENCOUNTER — Ambulatory Visit: Payer: Medicare Other | Admitting: Cardiology

## 2021-07-07 VITALS — BP 126/58 | HR 60 | Ht 68.0 in | Wt 155.2 lb

## 2021-07-07 DIAGNOSIS — D6869 Other thrombophilia: Secondary | ICD-10-CM

## 2021-07-07 DIAGNOSIS — E782 Mixed hyperlipidemia: Secondary | ICD-10-CM

## 2021-07-07 DIAGNOSIS — I350 Nonrheumatic aortic (valve) stenosis: Secondary | ICD-10-CM | POA: Diagnosis not present

## 2021-07-07 DIAGNOSIS — I4892 Unspecified atrial flutter: Secondary | ICD-10-CM

## 2021-07-07 NOTE — Progress Notes (Signed)
? ? ? ?Clinical Summary ?[name] is a 87 y.o.male ? ?1.Atrial flutter ?- 11/07/20 DCCV. HRs low after conversion, diltiazem was stopped and continued on toprol '25mg'$ . Toprol lowered to 12.'5mg'$  daily after ongoing bradycardia and then later stopped ? ?- EKG today shows SR, PAC, PVC ?- avg HR's high 50s.  ? ? ?2.History of chest pain ?Lexiscan normal 11/25/20 ?- no recent symptoms ? ? ? ?3,. HTN ?- compliant with meds ?- avg bp 130s/70s ?-upcoming appt with Dr Gerarda Fraction ? ?4. Aortic stenosis ?- 09/2020 echo LVEF 65-70%, AV mean grad 10 DI 0.44 AVA VTI 1.39 SVI 32  ? ?5. Hyperlipidemia ?- reports he was on simvastatin ?- mildly elevated LFTs, appears simva was stopped at that time.  ? ? ? ? ?Past Medical History:  ?Diagnosis Date  ? Arthritis   ? Atrial fibrillation ([place])   ? Auto immune neutropenia (HCC)   ? Bullous pemphigoid   ? Cancer [facility])   ? Prostate  ? Cervical spondylosis without myelopathy 01/22/2016  ? Complication of anesthesia   ? difficult to wake up after anesthesia  ? DVT (deep venous thrombosis) ([place])   ? a. Has had a previous right lower extremity DVT in the setting of hospitalization and surgery (after his brain tumor was resected in 1993) but is no longer on Coumadin.  ? Dyslipidemia   ? History of prostate cancer   ? Hypertension   ? Meningioma ([place])   ? a. s/p resection 1990s.  ? Myasthenia ([place]) 02/14/2013  ? Nocturnal leg cramps   ? Ocular myasthenia gravis ([place])   ? Peripheral neuropathy 11/23/2018  ? Seizures ([place])   ? Sinus bradycardia   ? Sleep paralysis   ? Thyroid nodule, cold   ? ? ? ?Allergies  ?Allergen Reactions  ? Iodinated Contrast Media Hives  ?   pt was premedicated/hives reaction occurred 24 hours post injection, Onset Date: [id number] ?  ? Sulfa Antibiotics Swelling and Rash  ? Bactericin [Bacitracin] Other (See Comments)  ?  Unknown reaction   ? ? ? ?Current Outpatient Medications  ?Medication Sig Dispense Refill  ? acetaminophen (TYLENOL) 325 MG tablet Take 2 tablets (650 mg total)  by mouth every 6 (six) hours as needed for mild pain (or Fever >/= 101). 12 tablet 4  ? apixaban (ELIQUIS) 5 MG TABS tablet Take 1 tablet (5 mg total) by mouth 2 (two) times daily. 60 tablet 5  ? Cholecalciferol (VITAMIN D) 50 MCG (2000 UT) tablet Take 8,000 Units by mouth daily.    ? clobetasol cream (TEMOVATE) 3.50 % Apply 1 application topically 2 (two) times daily as needed (blisters).    ? fish oil-omega-3 fatty acids 1000 MG capsule Take 1 g by mouth 3 (three) times daily.    ? gabapentin (NEURONTIN) 600 MG tablet Take 1 tablet (600 mg total) by mouth daily. 90 tablet 3  ? Garlic (GARLIQUE PO) Take 1 tablet by mouth daily.    ? levETIRAcetam (KEPPRA) 500 MG tablet Take 1/2 tablet in the morning, take 1 tablet in the evening 135 tablet 3  ? losartan (COZAAR) 25 MG tablet Take 1 tablet (25 mg total) by mouth in the morning and at bedtime. 90 tablet 3  ? niacinamide 500 MG tablet Take 500 mg by mouth 3 (three) times daily.    ? oxymetazoline (AFRIN) 0.05 % nasal spray Place 1 spray into both nostrils 2 (two) times daily as needed for congestion.    ? Polyethyl Glycol-Propyl Glycol 0.4-0.3 %  SOLN Place 1 drop into both eyes 3 (three) times daily as needed (for dry eyes).    ? pyridostigmine (MESTINON) 60 MG tablet TAKE 1 AND 1/2 TABLETS BY  MOUTH 4 TIMES DAILY 540 tablet 3  ? tiZANidine (ZANAFLEX) 4 MG tablet Take 4 mg by mouth at bedtime.    ? ?No current facility-administered medications for this visit.  ? ? ? ?Past Surgical History:  ?Procedure Laterality Date  ? BLADDER SURGERY    ? removed part of the bladder  ? BRAIN SURGERY    ? CARDIOVERSION N/A 11/07/2020  ? Procedure: CARDIOVERSION;  Surgeon: Arnoldo Lenis, MD;  Location: [facility];  Service: Endoscopy;  Laterality: N/A;  ? CHOLECYSTECTOMY    ? COLONOSCOPY N/A 07/25/2013  ? Procedure: COLONOSCOPY;  Surgeon: Jamesetta So, MD;  Location[facility];  Service: Gastroenterology;  Laterality: N/A;  ? COLONOSCOPY N/A 11/10/2016  ? Procedure: COLONOSCOPY;   Surgeon: Aviva Signs, MD;  Location: [facility];  Service: Gastroenterology;  Laterality: N/A;  ? IR CATHETER TUBE CHANGE  01/02/2017  ? IR RADIOLOGIST EVAL & MGMT  01/06/2017  ? IR RADIOLOGIST EVAL & MGMT  01/20/2017  ? IR RADIOLOGIST EVAL & MGMT  02/03/2017  ? IR RADIOLOGIST EVAL & MGMT  02/17/2017  ? IR SINUS/FIST TUBE CHK-NON GI  03/03/2017  ? PARTIAL COLECTOMY    ? RADIOACTIVE SEED IMPLANT    ? ? ? ?Allergies  ?Allergen Reactions  ? Iodinated Contrast Media Hives  ?   pt was premedicated/hives reaction occurred 24 hours post injection, Onset Date: [id number] ?  ? Sulfa Antibiotics Swelling and Rash  ? Bactericin [Bacitracin] Other (See Comments)  ?  Unknown reaction   ? ? ? ? ?Family History  ?Problem Relation Age of Onset  ? Heart failure Mother   ? Diabetes Mother   ? Diabetes Sister   ? Dementia Sister   ? Lung cancer Son   ? Heart failure Daughter   ? Colon cancer Neg Hx   ? ? ? ?Social History ?[name] reports that he has never smoked. He has never been exposed to tobacco smoke. He has never used smokeless tobacco. ?[name] reports no history of alcohol use. ? ? ?Review of Systems ?CONSTITUTIONAL: No weight loss, fever, chills, weakness or fatigue.  ?HEENT: Eyes: No visual loss, blurred vision, double vision or yellow sclerae.No hearing loss, sneezing, congestion, runny nose or sore throat.  ?SKIN: No rash or itching.  ?CARDIOVASCULAR: per hpi ?RESPIRATORY: No shortness of breath, cough or sputum.  ?GASTROINTESTINAL: No anorexia, nausea, vomiting or diarrhea. No abdominal pain or blood.  ?GENITOURINARY: No burning on urination, no polyuria ?NEUROLOGICAL: No headache, dizziness, syncope, paralysis, ataxia, numbness or tingling in the extremities. No change in bowel or bladder control.  ?MUSCULOSKELETAL: No muscle, back pain, joint pain or stiffness.  ?LYMPHATICS: No enlarged nodes. No history of splenectomy.  ?PSYCHIATRIC: No history of depression or anxiety.  ?ENDOCRINOLOGIC: No reports of  sweating, cold or heat intolerance. No polyuria or polydipsia.  ?. ? ? ?Physical Examination ?Today's Vitals  ? 07/07/21 1516  ?BP: (!) 126/58  ?Pulse: 60  ?SpO2: 97%  ?Weight: 155 lb 3.2 oz (70.4 kg)  ?Height: '5\' 8"'$  (1.727 m)  ? ?Body mass index is 23.6 kg/m?. ? ?Gen: resting comfortably, no acute distress ?HEENT: no scleral icterus, pupils equal round and reactive, no palptable cervical adenopathy,  ?CV: RRR, 3/6 systolic murmu rusb, no jvd ?Resp: Clear to auscultation bilaterally ?GI: abdomen is  soft, non-tender, non-distended, normal bowel sounds, no hepatosplenomegaly ?MSK: extremities are warm, no edema.  ?Skin: warm, no rash ?Neuro:  no focal deficits ?Psych: appropriate affect ? ? ? ? ?Assessment and Plan  ?1.[place]/acquired thrombophilia ?- continues to maintain SR ?- continue eliquis for stroke prevention, he is self rate controlled ? ?2. Moderate AS ?- repeat echo 10/2021 ?- lower than expected gradient due to decreased SVI ? ?3. HTN ?- on average above goal, his preference if for pcp to adjust meds at upcoming f/u ? ?4. Hyperlipidemia ?- off simvastatin, details unclear. I assume was stopped by a provider due to elevation in LFTs to 100s ?- he reports upcoming labs with pcp ?- could consider trial of low dose crestor or possible pravastatin depending of LFTs and lipid numbers ? ? ? ?F/u 6 months ? ? ? ?Arnoldo Lenis, M.D. ?

## 2021-07-07 NOTE — Patient Instructions (Addendum)
Medication Instructions:  ?Continue all current medications. ? ?Labwork: ?none ? ?Testing/Procedures: ?Your physician has requested that you have an echocardiogram. Echocardiography is a painless test that uses sound waves to create images of your heart. It provides your doctor with information about the size and shape of your heart and how well your heart?s chambers and valves are working. This procedure takes approximately one hour. There are no restrictions for this procedure - due July.  ?Office will contact with results via phone or letter.   ? ?Follow-Up: ?6 months  ? ?Any Other Special Instructions Will Be Listed Below (If Applicable). ? ? ?If you need a refill on your cardiac medications before your next appointment, please call your pharmacy. ? ?

## 2021-07-08 ENCOUNTER — Encounter: Payer: Self-pay | Admitting: *Deleted

## 2021-07-17 DIAGNOSIS — M47816 Spondylosis without myelopathy or radiculopathy, lumbar region: Secondary | ICD-10-CM | POA: Diagnosis not present

## 2021-07-29 DIAGNOSIS — Z0001 Encounter for general adult medical examination with abnormal findings: Secondary | ICD-10-CM | POA: Diagnosis not present

## 2021-07-29 DIAGNOSIS — E559 Vitamin D deficiency, unspecified: Secondary | ICD-10-CM | POA: Diagnosis not present

## 2021-07-29 DIAGNOSIS — I4891 Unspecified atrial fibrillation: Secondary | ICD-10-CM | POA: Diagnosis not present

## 2021-07-29 DIAGNOSIS — E063 Autoimmune thyroiditis: Secondary | ICD-10-CM | POA: Diagnosis not present

## 2021-07-29 DIAGNOSIS — N183 Chronic kidney disease, stage 3 unspecified: Secondary | ICD-10-CM | POA: Diagnosis not present

## 2021-07-29 DIAGNOSIS — Z6822 Body mass index (BMI) 22.0-22.9, adult: Secondary | ICD-10-CM | POA: Diagnosis not present

## 2021-07-29 DIAGNOSIS — M1991 Primary osteoarthritis, unspecified site: Secondary | ICD-10-CM | POA: Diagnosis not present

## 2021-07-29 DIAGNOSIS — I129 Hypertensive chronic kidney disease with stage 1 through stage 4 chronic kidney disease, or unspecified chronic kidney disease: Secondary | ICD-10-CM | POA: Diagnosis not present

## 2021-07-29 DIAGNOSIS — I10 Essential (primary) hypertension: Secondary | ICD-10-CM | POA: Diagnosis not present

## 2021-08-15 DIAGNOSIS — M47896 Other spondylosis, lumbar region: Secondary | ICD-10-CM | POA: Diagnosis not present

## 2021-08-15 DIAGNOSIS — M47816 Spondylosis without myelopathy or radiculopathy, lumbar region: Secondary | ICD-10-CM | POA: Diagnosis not present

## 2021-09-10 DIAGNOSIS — N183 Chronic kidney disease, stage 3 unspecified: Secondary | ICD-10-CM | POA: Diagnosis not present

## 2021-09-10 DIAGNOSIS — I129 Hypertensive chronic kidney disease with stage 1 through stage 4 chronic kidney disease, or unspecified chronic kidney disease: Secondary | ICD-10-CM | POA: Diagnosis not present

## 2021-09-12 DIAGNOSIS — M47816 Spondylosis without myelopathy or radiculopathy, lumbar region: Secondary | ICD-10-CM | POA: Diagnosis not present

## 2021-09-12 DIAGNOSIS — M5030 Other cervical disc degeneration, unspecified cervical region: Secondary | ICD-10-CM | POA: Diagnosis not present

## 2021-09-12 DIAGNOSIS — M5136 Other intervertebral disc degeneration, lumbar region: Secondary | ICD-10-CM | POA: Diagnosis not present

## 2021-09-12 DIAGNOSIS — M545 Low back pain, unspecified: Secondary | ICD-10-CM | POA: Insufficient documentation

## 2021-09-12 DIAGNOSIS — D6869 Other thrombophilia: Secondary | ICD-10-CM | POA: Diagnosis not present

## 2021-09-16 ENCOUNTER — Other Ambulatory Visit: Payer: Self-pay | Admitting: Physical Medicine and Rehabilitation

## 2021-09-16 DIAGNOSIS — M545 Low back pain, unspecified: Secondary | ICD-10-CM

## 2021-10-03 ENCOUNTER — Ambulatory Visit
Admission: RE | Admit: 2021-10-03 | Discharge: 2021-10-03 | Disposition: A | Discharge status: Home / Self Care | Payer: Medicare Other | Source: Ambulatory Visit | Attending: Physical Medicine and Rehabilitation | Admitting: Physical Medicine and Rehabilitation

## 2021-10-03 DIAGNOSIS — M48061 Spinal stenosis, lumbar region without neurogenic claudication: Secondary | ICD-10-CM | POA: Diagnosis not present

## 2021-10-03 DIAGNOSIS — M545 Low back pain, unspecified: Secondary | ICD-10-CM

## 2021-10-15 ENCOUNTER — Ambulatory Visit (INDEPENDENT_AMBULATORY_CARE_PROVIDER_SITE_OTHER): Payer: Medicare Other

## 2021-10-15 DIAGNOSIS — I350 Nonrheumatic aortic (valve) stenosis: Secondary | ICD-10-CM

## 2021-10-15 LAB — ECHOCARDIOGRAM COMPLETE
AR max vel: 0.91 cm2
AV Area VTI: 1.12 cm2
AV Area mean vel: 0.93 cm2
AV Mean grad: 24.3 mmHg
AV Peak grad: 40.4 mmHg
AV Vena cont: 0.29 cm
Ao pk vel: 3.18 m/s
Area-P 1/2: 1.81 cm2
Calc EF: 62.9 %
MV M vel: 3.72 m/s
MV Peak grad: 55.2 mmHg
P 1/2 time: 1914 msec
S' Lateral: 2.33 cm
Single Plane A2C EF: 63.9 %
Single Plane A4C EF: 65.8 %

## 2021-10-28 DIAGNOSIS — M5030 Other cervical disc degeneration, unspecified cervical region: Secondary | ICD-10-CM | POA: Diagnosis not present

## 2021-10-28 DIAGNOSIS — M5136 Other intervertebral disc degeneration, lumbar region: Secondary | ICD-10-CM | POA: Diagnosis not present

## 2021-10-28 DIAGNOSIS — D6869 Other thrombophilia: Secondary | ICD-10-CM | POA: Diagnosis not present

## 2021-11-04 DIAGNOSIS — I4891 Unspecified atrial fibrillation: Secondary | ICD-10-CM | POA: Diagnosis not present

## 2021-11-04 DIAGNOSIS — I10 Essential (primary) hypertension: Secondary | ICD-10-CM | POA: Diagnosis not present

## 2021-11-04 DIAGNOSIS — I129 Hypertensive chronic kidney disease with stage 1 through stage 4 chronic kidney disease, or unspecified chronic kidney disease: Secondary | ICD-10-CM | POA: Diagnosis not present

## 2021-11-04 DIAGNOSIS — Z6822 Body mass index (BMI) 22.0-22.9, adult: Secondary | ICD-10-CM | POA: Diagnosis not present

## 2021-11-04 DIAGNOSIS — E063 Autoimmune thyroiditis: Secondary | ICD-10-CM | POA: Diagnosis not present

## 2021-11-04 DIAGNOSIS — E782 Mixed hyperlipidemia: Secondary | ICD-10-CM | POA: Diagnosis not present

## 2021-11-04 DIAGNOSIS — N183 Chronic kidney disease, stage 3 unspecified: Secondary | ICD-10-CM | POA: Diagnosis not present

## 2021-11-06 ENCOUNTER — Telehealth: Payer: Self-pay | Admitting: Cardiology

## 2021-11-06 NOTE — Telephone Encounter (Signed)
Pt returning nurses call regarding results. Call transferred 

## 2021-11-06 NOTE — Telephone Encounter (Signed)
Patient called for the results of Echo. Results explained. Patient verbalized understanding.

## 2021-11-06 NOTE — Telephone Encounter (Signed)
lmtcb

## 2021-11-06 NOTE — Telephone Encounter (Signed)
Pt is returning call. Requesting call back.  

## 2021-11-10 ENCOUNTER — Telehealth: Payer: Self-pay | Admitting: Cardiology

## 2021-11-10 MED ORDER — APIXABAN 5 MG PO TABS: 5.0000 mg | ORAL_TABLET | Freq: Two times a day (BID) | ORAL | 5 refills | Status: DC

## 2021-11-10 NOTE — Telephone Encounter (Signed)
Patient needs refill on Eliquis '5mg'$   He said he called pharmacy and, went by there. Scheduled to see Dr. Harl Bowie 01/09/2022. Has one for tonight and tomorrow morning.

## 2021-11-10 NOTE — Telephone Encounter (Signed)
Refilled per request, wife notified

## 2021-11-11 ENCOUNTER — Telehealth: Payer: Self-pay | Admitting: Cardiology

## 2021-11-11 NOTE — Telephone Encounter (Signed)
   Pre-operative Risk Assessment    Patient Name: Kyle [name].  DOB: Feb 05, 1935 MRN: [id number]     Request for Surgical Clearance    Procedure:  Dental Extraction - Amount of Teeth to be Pulled:  3  Date of Surgery:  Clearance TBD                                 Surgeon:  Not indicated Surgeon's Group or Practice Name:  [facility] Phone number:  [phone] Fax number:  [phone]   Type of Clearance Requested:   - Pharmacy:  Hold Apixaban (Eliquis)     Type of Anesthesia:  Local    Additional requests/questions:    Louretta Shorten   11/11/2021, 10:22 AM

## 2021-11-12 NOTE — Telephone Encounter (Signed)
Calling for update

## 2021-11-13 ENCOUNTER — Other Ambulatory Visit: Payer: Self-pay

## 2021-11-13 ENCOUNTER — Encounter: Payer: Self-pay | Admitting: Student

## 2021-11-13 ENCOUNTER — Encounter (HOSPITAL_COMMUNITY): Payer: Self-pay

## 2021-11-13 ENCOUNTER — Telehealth: Payer: Self-pay | Admitting: Cardiology

## 2021-11-13 ENCOUNTER — Emergency Department (HOSPITAL_COMMUNITY): Payer: Medicare Other

## 2021-11-13 ENCOUNTER — Ambulatory Visit: Payer: Medicare Other | Admitting: Student

## 2021-11-13 ENCOUNTER — Emergency Department (HOSPITAL_COMMUNITY)
Admission: EM | Admit: 2021-11-13 | Discharge: 2021-11-13 | Disposition: A | Discharge status: Home / Self Care | Payer: Medicare Other | Attending: Student | Admitting: Student

## 2021-11-13 VITALS — BP 134/80 | HR 128 | Ht 67.0 in | Wt 151.4 lb

## 2021-11-13 DIAGNOSIS — Z7901 Long term (current) use of anticoagulants: Secondary | ICD-10-CM | POA: Insufficient documentation

## 2021-11-13 DIAGNOSIS — Z79899 Other long term (current) drug therapy: Secondary | ICD-10-CM | POA: Insufficient documentation

## 2021-11-13 DIAGNOSIS — I4892 Unspecified atrial flutter: Secondary | ICD-10-CM

## 2021-11-13 DIAGNOSIS — I10 Essential (primary) hypertension: Secondary | ICD-10-CM | POA: Insufficient documentation

## 2021-11-13 DIAGNOSIS — R002 Palpitations: Secondary | ICD-10-CM | POA: Diagnosis not present

## 2021-11-13 DIAGNOSIS — Z8546 Personal history of malignant neoplasm of prostate: Secondary | ICD-10-CM | POA: Insufficient documentation

## 2021-11-13 DIAGNOSIS — I483 Typical atrial flutter: Secondary | ICD-10-CM | POA: Insufficient documentation

## 2021-11-13 DIAGNOSIS — E782 Mixed hyperlipidemia: Secondary | ICD-10-CM

## 2021-11-13 DIAGNOSIS — I350 Nonrheumatic aortic (valve) stenosis: Secondary | ICD-10-CM | POA: Diagnosis not present

## 2021-11-13 LAB — CBC WITH DIFFERENTIAL/PLATELET
Abs Immature Granulocytes: 0.03 10*3/uL (ref 0.00–0.07)
Basophils Absolute: 0 10*3/uL (ref 0.0–0.1)
Basophils Relative: 0 %
Eosinophils Absolute: 0 10*3/uL (ref 0.0–0.5)
Eosinophils Relative: 0 %
HCT: 41 % (ref 39.0–52.0)
Hemoglobin: 13.8 g/dL (ref 13.0–17.0)
Immature Granulocytes: 0 %
Lymphocytes Relative: 24 %
Lymphs Abs: 2 10*3/uL (ref 0.7–4.0)
MCH: 30.9 pg (ref 26.0–34.0)
MCHC: 33.7 g/dL (ref 30.0–36.0)
MCV: 91.7 fL (ref 80.0–100.0)
Monocytes Absolute: 0.7 10*3/uL (ref 0.1–1.0)
Monocytes Relative: 8 %
Neutro Abs: 5.6 10*3/uL (ref 1.7–7.7)
Neutrophils Relative %: 68 %
Platelets: 207 10*3/uL (ref 150–400)
RBC: 4.47 MIL/uL (ref 4.22–5.81)
RDW: 13.2 % (ref 11.5–15.5)
WBC: 8.4 10*3/uL (ref 4.0–10.5)
nRBC: 0 % (ref 0.0–0.2)

## 2021-11-13 LAB — COMPREHENSIVE METABOLIC PANEL
ALT: 17 U/L (ref 0–44)
AST: 20 U/L (ref 15–41)
Albumin: 4 g/dL (ref 3.5–5.0)
Alkaline Phosphatase: 58 U/L (ref 38–126)
Anion gap: 6 (ref 5–15)
BUN: 22 mg/dL (ref 8–23)
CO2: 26 mmol/L (ref 22–32)
Calcium: 9.4 mg/dL (ref 8.9–10.3)
Chloride: 106 mmol/L (ref 98–111)
Creatinine, Ser: 0.83 mg/dL (ref 0.61–1.24)
GFR, Estimated: >60 mL/min (ref >60)
Glucose, Bld: 82 mg/dL (ref 70–99)
Potassium: 3.9 mmol/L (ref 3.5–5.1)
Sodium: 138 mmol/L (ref 135–145)
Total Bilirubin: 0.7 mg/dL (ref 0.3–1.2)
Total Protein: 7.4 g/dL (ref 6.5–8.1)

## 2021-11-13 MED ORDER — LACTATED RINGERS IV BOLUS
1000.0000 mL | Freq: Once | INTRAVENOUS | Status: AC
  Administered 2021-11-13: 1000 mL via INTRAVENOUS

## 2021-11-13 NOTE — ED Triage Notes (Signed)
Pt HR in the 130's. Normally run in the 50's. Sent from [employer]

## 2021-11-13 NOTE — Telephone Encounter (Signed)
Repeat BP 111/87, HR 139   Patient has not missed any doses of eliquis in the last month    Will evaluate at appointment today. For possible DCCV in ED

## 2021-11-13 NOTE — Telephone Encounter (Signed)
Wife says patient keeps monthly BP/HR log and when they took hid BP this am it was 116/81 and HR was 96. She states she normally runs in the 50's. When they repeated HR is is 138. He does not feel this. Wife noted his face was red yesterday but that is the only thing different. No c/o SOB or CP. He had DCCV 2022 for A-flutter and was converted to SR.Metoprolol was lowered and then stopped due to bradycardia.   Wife still has metoprolol at home and winders if she should give it to him.    He has appointment today at 1:30 pm with B.Strader,PA-C    I will message provider for advice.

## 2021-11-13 NOTE — Telephone Encounter (Signed)
Patient walked in with daughter asking to be worked in having a high heart rate he explained that his hr is normally 56bpm  it is now as high as 138bpm. Estill Bamberg was able to put him in [place] at 1:30 today. Daughter told me his face is red, he isn't talking much that is not like him, and he tells her he doesn't feel well. No sob or, chest pain. Please advise if he needs to visit the ER instead.

## 2021-11-13 NOTE — Telephone Encounter (Signed)
   Patient Name: Kyle [name].  DOB: May 04, 1934 MRN: [id number]  Primary Cardiologist: Carlyle Dolly, MD  Chart reviewed as part of pre-operative protocol coverage. Given past medical history and time since last visit, based on ACC/AHA guidelines, Kyle [name]. would be at acceptable risk for the planned procedure without further cardiovascular testing.   Patient does not require pre-op antibiotics for dental procedure.   Per office protocol, patient can hold Eliquis for 2 days prior to procedure.   Patient will not need bridging with Lovenox (enoxaparin) around procedure.  I will route this recommendation to the requesting party via Epic fax function and remove from pre-op pool.  Please call with questions.  Mable Fill, Marissa Nestle, NP 11/13/2021, 1:01 PM

## 2021-11-13 NOTE — Progress Notes (Signed)
Cardiology Office Note    Date:  11/13/2021   ID:  Kyle [name]., DOB [birth year], MRN [id number]  PCP:  Redmond School, MD  Cardiologist: Carlyle Dolly, MD    Chief Complaint  Patient presents with   Follow-up    Elevated HR    History of Present Illness:    Kyle [name]. is a 87 y.o. male with past medical history of persistent atrial flutter (s/p DCCV in 10/2020), history of chest pain (normal NST in 11/2020), HTN, HLD and aortic stenosis who presents to the office today for evaluation of elevated heart rate.  He was last examined by Dr. Harl Bowie in 06/2021 and denied any recent chest pain or palpitations. He previously had bradycardia following his DCCV and ultimately lowered Toprol-XL to 12.5 mg daily and eventually stopped the medication. He was continued on Eliquis at the time of of his visit and it was recommended to have a follow-up echocardiogram in 10/2021 for reassessment of his aortic stenosis. This showed a preserved EF of 70 to 75% with no wall motion abnormalities. He did have grade 2 diastolic dysfunction, normal RV function, moderately dilated LA, trivial MR and moderate aortic stenosis.  In talking with the patient and his wife today, he reports working outside throughout the day yesterday and felt fine with this. Denies any associated chest pain, palpitations or dyspnea at that time. His wife does report he appeared more flushed. This morning upon waking, he checked his vitals and noted his heart rate was in the 130's. He brings with him today a very detailed BP and heart rate log and his heart rate is typically in the 40's to 50's at home which has led to discontinuation of AV nodal blocking agents in the past. He says he might feel a bit more fatigued today but denies any other symptoms. He reports great compliance with Eliquis and has not missed any doses.  Past Medical History:  Diagnosis Date   Arthritis    Atrial fibrillation (HCC)    Auto immune  neutropenia (HCC)    Bullous pemphigoid    Cancer (HCC)    Prostate   Cervical spondylosis without myelopathy 54/27/0623   Complication of anesthesia    difficult to wake up after anesthesia   DVT (deep venous thrombosis) ([place])    a. Has had a previous right lower extremity DVT in the setting of hospitalization and surgery (after his brain tumor was resected in 1993) but is no longer on Coumadin.   Dyslipidemia    History of prostate cancer    Hypertension    Meningioma ([place])    a. s/p resection 1990s.   Myasthenia ([place]) 02/14/2013   Nocturnal leg cramps    Ocular myasthenia gravis ([place])    Peripheral neuropathy 11/23/2018   Seizures ([place])    Sinus bradycardia    Sleep paralysis    Thyroid nodule, cold     Past Surgical History:  Procedure Laterality Date   BLADDER SURGERY     removed part of the bladder   BRAIN SURGERY     CARDIOVERSION N/A 11/07/2020   Procedure: CARDIOVERSION;  Surgeon: Arnoldo Lenis, MD;  Location: [facility];  Service: Endoscopy;  Laterality: N/A;   CHOLECYSTECTOMY     COLONOSCOPY N/A 07/25/2013   Procedure: COLONOSCOPY;  Surgeon: Jamesetta So, MD;  Location: [facility];  Service: Gastroenterology;  Laterality: N/A;   COLONOSCOPY N/A 11/10/2016   Procedure: COLONOSCOPY;  Surgeon: Aviva Signs, MD;  Location: [facility];  Service: Gastroenterology;  Laterality: N/A;   IR CATHETER TUBE CHANGE  01/02/2017   IR RADIOLOGIST EVAL & MGMT  01/06/2017   IR RADIOLOGIST EVAL & MGMT  01/20/2017   IR RADIOLOGIST EVAL & MGMT  02/03/2017   IR RADIOLOGIST EVAL & MGMT  02/17/2017   IR SINUS/FIST TUBE CHK-NON GI  03/03/2017   PARTIAL COLECTOMY     RADIOACTIVE SEED IMPLANT      Current Medications: Outpatient Medications Prior to Visit  Medication Sig Dispense Refill   acetaminophen (TYLENOL) 325 MG tablet Take 2 tablets (650 mg total) by mouth every 6 (six) hours as needed for mild pain (or Fever >/= 101). 12 tablet 4   apixaban (ELIQUIS) 5 MG TABS  tablet Take 1 tablet (5 mg total) by mouth 2 (two) times daily. 60 tablet 5   Cholecalciferol (VITAMIN D) 50 MCG (2000 UT) tablet Take 8,000 Units by mouth daily.     clobetasol cream (TEMOVATE) 5.62 % Apply 1 application topically 2 (two) times daily as needed (blisters).     fish oil-omega-3 fatty acids 1000 MG capsule Take 1 g by mouth 3 (three) times daily.     gabapentin (NEURONTIN) 600 MG tablet Take 1 tablet (600 mg total) by mouth daily. 90 tablet 3   Garlic (GARLIQUE PO) Take 1 tablet by mouth daily.     HYDROcodone-acetaminophen (NORCO/VICODIN) 5-325 MG tablet Take 1 tablet by mouth 3 (three) times daily as needed.     levETIRAcetam (KEPPRA) 500 MG tablet Take 1/2 tablet in the morning, take 1 tablet in the evening 135 tablet 3   losartan (COZAAR) 25 MG tablet Take 1 tablet (25 mg total) by mouth in the morning and at bedtime. 90 tablet 3   niacinamide 500 MG tablet Take 500 mg by mouth 3 (three) times daily.     oxymetazoline (AFRIN) 0.05 % nasal spray Place 1 spray into both nostrils 2 (two) times daily as needed for congestion.     Polyethyl Glycol-Propyl Glycol 0.4-0.3 % SOLN Place 1 drop into both eyes 3 (three) times daily as needed (for dry eyes).     pyridostigmine (MESTINON) 60 MG tablet TAKE 1 AND 1/2 TABLETS BY  MOUTH 4 TIMES DAILY 540 tablet 3   tiZANidine (ZANAFLEX) 4 MG tablet Take 4 mg by mouth at bedtime.     No facility-administered medications prior to visit.     Allergies:   Iodinated contrast media, Sulfa antibiotics, and Bactericin [bacitracin]   Social History   Socioeconomic History   Marital status: Married    Spouse name: Not on file   Number of children: 1   Years of education: 44 TH   Highest education level: Not on file  Occupational History   Occupation: retired  Tobacco Use   Smoking status: Never    Passive exposure: Never   Smokeless tobacco: Never  Vaping Use   Vaping Use: Never used  Substance and Sexual Activity   Alcohol use: No    Drug use: No   Sexual activity: Not on file  Other Topics Concern   Not on file  Social History Narrative   Patient is right handed.   Patient drinks 1 cup of coffee daily.   Social Determinants of Health   Financial Resource Strain: Not on file  Food Insecurity: Not on file  Transportation Needs: Not on file  Physical Activity: Not on file  Stress: Not on file  Social Connections: Not on file  Family History:  The patient's family history includes Dementia in his sister; Diabetes in his mother and sister; Heart failure in his daughter and mother; Lung cancer in his son.   Review of Systems:    Please see the history of present illness.     All other systems reviewed and are otherwise negative except as noted above.   Physical Exam:    VS:  BP 134/80   Pulse (!) 128   Ht '5\' 7"'$  (1.702 m)   Wt 151 lb 6.4 oz (68.7 kg)   SpO2 94%   BMI 23.71 kg/m    General: Pleasant elderly male appearing in no acute distress. Head: Normocephalic, atraumatic. Neck: No carotid bruits. JVD not elevated.  Lungs: Respirations regular and unlabored, without wheezes or rales.  Heart: Irregular irregular, tachycardiac. No S3 or S4.  2/6 SEM along RUSB.  Abdomen: Appears non-distended. No obvious abdominal masses. Msk:  Strength and tone appear normal for age. No obvious joint deformities or effusions. Extremities: No clubbing or cyanosis. No pitting edema.  Distal pedal pulses are 2+ bilaterally. Neuro: Alert and oriented X 3. Moves all extremities spontaneously. No focal deficits noted. Psych:  Responds to questions appropriately with a normal affect. Skin: No rashes or lesions noted  Wt Readings from Last 3 Encounters:  11/13/21 151 lb 6.4 oz (68.7 kg)  07/07/21 155 lb 3.2 oz (70.4 kg)  05/29/21 154 lb (69.9 kg)      Studies/Labs Reviewed:   EKG:  EKG is ordered today.  The ekg ordered today demonstrates atrial flutter with RVR, heart rate 128 with PVC's and T wave inversion along  the inferior and lateral leads which might be rate related.  Recent Labs: 05/05/2021: TSH 1.970 11/13/2021: ALT 17; BUN 22; Creatinine, Ser 0.83; Hemoglobin 13.8; Platelets 207; Potassium 3.9; Sodium 138   Lipid Panel    Component Value Date/Time   CHOL 182 10/26/2014 0912   TRIG 130 10/26/2014 0912   HDL 67 10/26/2014 0912   CHOLHDL 2.7 10/26/2014 0912   VLDL 26 10/26/2014 0912   LDLCALC 89 10/26/2014 0912    Additional studies/ records that were reviewed today include:   NST: 12/05/2020   The study is normal. The study is low risk. There are no perfusion defects consistent with prior infarct or current ischemia.   1.0 mm of horizontal ST depression in the inferior leads (II, III and aVF) was noted.   Defect 1: There is a medium defect with moderate reduction in uptake present in the apical to basal inferior location(s). The defect is most intense in the resting images most consistent with subdiaphragmatic attenuation.   Left ventricular function is normal.  Echocardiogram: 10/15/2021 IMPRESSIONS     1. Left ventricular ejection fraction, by estimation, is 70 to 75%. The  left ventricle has hyperdynamic function. The left ventricle has no  regional wall motion abnormalities. Left ventricular diastolic parameters  are consistent with Grade II diastolic  dysfunction (pseudonormalization). The average left ventricular global  longitudinal strain is -19.9 %. The global longitudinal strain is normal.   2. Right ventricular systolic function is normal. The right ventricular  size is normal. There is normal pulmonary artery systolic pressure.   3. Left atrial size was moderately dilated.   4. Right atrial size was mildly dilated.   5. The mitral valve is grossly normal. Trivial mitral valve  regurgitation.   6. The aortic valve is functionally bicuspid. There is moderate  calcification of the aortic valve. Aortic valve  regurgitation is trivial.  Moderate aortic valve stenosis.  Aortic regurgitation PHT measures 1914  msec. Aortic valve mean gradient measures  24.3 mmHg. Aortic valve Vmax measures 3.18 m/s. Dimentionless index 0.36.   7. The inferior vena cava is normal in size with greater than 50%  respiratory variability, suggesting right atrial pressure of 3 mmHg.   Comparison(s): Prior images reviewed side by side. Aortic stenosis still  in moderate range, but increased mean gradient from 10 to 24 mmHg  consistent with progression.   Assessment:    1. Atrial flutter with rapid ventricular response ([place])   2. Current use of long term anticoagulation   3. Essential hypertension   4. Mixed hyperlipidemia   5. Aortic stenosis, moderate      Plan:   In order of problems listed above:  1. Atrial Flutter with RVR - He has a history of atrial flutter and underwent successful cardioversion in 10/2020 but likely went back into the arrhythmia overnight or this morning judging by his HR/BP log. He did take Toprol-XL 25 mg at home approximately 2 hours prior to his office visit but remains tachycardic with heart rate in the 120's to 130's.  Reviewed options with the patient and his wife and given that atrial flutter is going to be challenging to manage with medical therapy and in the setting of bradycardia when in normal sinus rhythm, will send to the ED for consideration of DCCV while there. I did review this with Dr. Johney Frame (DOD) and also made the ED provider aware. He is scheduled for dental surgery next week and we reviewed that this will need to be delayed for at least 30 days given that we cannot interrupt anticoagulation for at least 30 days following DCCV. - If DCCV is successful, would recommend keeping scheduled outpatient follow-up next month. While he is 87 years old, he is very active for his age and would consider EP referral for ablation. If not a candidate due to his age, would recommend antiarrhythmics.  2. HTN - His blood pressure is well controlled  at 134/80 during today's visit. Continue current medical therapy with Losartan 25 mg twice daily.  3. HLD - Followed by his PCP. He has remained on Fish Oil as he previously had transaminitis with statins in the past by review of prior notes.  4. Aortic stenosis - This was in a moderate range by most recent echocardiogram last month. Continue to follow.    Medication Adjustments/Labs and Tests Ordered: Current medicines are reviewed at length with the patient today.  Concerns regarding medicines are outlined above.  Medication changes, Labs and Tests ordered today are listed in the Patient Instructions below. Patient Instructions  Medication Instructions:  Your physician recommends that you continue on your current medications as directed. Please refer to the Current Medication list given to you today.   We are taking you to the ER now.  Keep September 29 th appointment at 9 am in the [place] office with Dr.Branch   Signed, Erma Heritage, PA-C  11/13/2021 4:39 PM    [facility] [address] [place], [place] [address] Phone: [phone] Fax: [phone]

## 2021-11-13 NOTE — Telephone Encounter (Signed)
Patient with diagnosis of atrial fibrillation on Eliquis for anticoagulation.    Procedure: dental extraction - 3 Date of procedure: TBD   CHA2DS2-VASc Score = 3   This indicates a 3.2% annual risk of stroke. The patient's score is based upon: CHF History: 0 HTN History: 1 Diabetes History: 0 Stroke History: 0 Vascular Disease History: 0 Age Score: 2 Gender Score: 0    CrCl 59 Platelet count 198  Patient does not require pre-op antibiotics for dental procedure.  Per office protocol, patient can hold Eliquis for 2 days prior to procedure.   Patient will not need bridging with Lovenox (enoxaparin) around procedure.  **This guidance is not considered finalized until pre-operative APP has relayed final recommendations.**

## 2021-11-13 NOTE — Telephone Encounter (Signed)
B.Strader, PA-C recommends patient take Lopressor 25 mg now. I will call her back in an hour to re-check HR/BP.

## 2021-11-13 NOTE — ED Provider Notes (Signed)
[facility] EMERGENCY DEPARTMENT Provider Note  CSN: [id number] Arrival date & time: 11/13/21 1410  Chief Complaint(s) Palpitations  HPI Kyle [name]. is a 87 y.o. male with PMH paroxysmal A-fib/flutter on Eliquis with perfect compliance, bullous pemphigoid, myasthenia gravis who presents emergency department for evaluation of rapid heart rate.  Patient took his heart rate at home and found himself to be tachycardic in the 130-140s.  Patient was seen in the heart care clinic today and was confirmed to be in a flutter with rapid ventricular rate and was transferred to the emergency department for consideration of ED cardioversion.  Patient has struggled with bradycardia while in normal sinus rhythm and thus has been weaned off of his Toprol.  He was encouraged to take his Toprol today at home and by the time he arrived to the emergency department remains in a flutter but has rates in the mid 80s.  He currently denies chest pain, shortness of breath, abdominal pain, nausea, vomiting or other symptoms symptoms.   Past Medical History Past Medical History:  Diagnosis Date   Arthritis    Atrial fibrillation (HCC)    Auto immune neutropenia (HCC)    Bullous pemphigoid    Cancer (HCC)    Prostate   Cervical spondylosis without myelopathy 19/41/7408   Complication of anesthesia    difficult to wake up after anesthesia   DVT (deep venous thrombosis) ([place])    a. Has had a previous right lower extremity DVT in the setting of hospitalization and surgery (after his brain tumor was resected in 1993) but is no longer on Coumadin.   Dyslipidemia    History of prostate cancer    Hypertension    Meningioma ([place])    a. s/p resection 1990s.   Myasthenia ([place]) 02/14/2013   Nocturnal leg cramps    Ocular myasthenia gravis ([place])    Peripheral neuropathy 11/23/2018   Seizures ([place])    Sinus bradycardia    Sleep paralysis    Thyroid nodule, cold    Patient Active Problem List   Diagnosis Date  Noted   New Onset/Atrial flutter, paroxysmal ([place]) 10/11/2020   New onset a-fib/Flutter  10/10/2020   HTN (hypertension) 10/10/2020   Aortic stenosis, moderate/Aortic valve mean gradient is 10.0 mmHg 10/10/2020   Peripheral neuropathy 11/23/2018   Intra-abdominal abscess (HCC)    Abscess of sigmoid colon due to diverticulitis    Acute diverticulitis of intestine 12/27/2016   Bowel perforation ([place])    Perforation and abscess of large intestine concurrent with and due to diverticulitis 12/15/2016   Blood in stool    Second degree hemorrhoids    Diverticulosis of large intestine without diverticulitis    Essential hypertension, benign 02/03/2016   Cervical spondylosis without myelopathy 01/22/2016   Nocturnal leg cramps 07/24/2015   Nontoxic multinodular goiter 01/31/2015   Elevated transaminase level 10/26/2014   Thrombocytopenia ([place]) 10/25/2014   Ocular Myasthenia ([place]) 02/14/2013   Convulsions/seizures ([place]) 02/14/2013   Home Medication(s) Prior to Admission medications   Medication Sig Start Date End Date Taking? Authorizing Provider  acetaminophen (TYLENOL) 325 MG tablet Take 2 tablets (650 mg total) by mouth every 6 (six) hours as needed for mild pain (or Fever >/= 101). 10/14/20   Emokpae, Courage, MD  apixaban (ELIQUIS) 5 MG TABS tablet Take 1 tablet (5 mg total) by mouth 2 (two) times daily. 11/10/21   Arnoldo Lenis, MD  Cholecalciferol (VITAMIN D) 50 MCG (2000 UT) tablet Take 8,000 Units by mouth daily.  [provider]  clobetasol cream (TEMOVATE) 6.56 % Apply 1 application topically 2 (two) times daily as needed (blisters).    [provider]  fish oil-omega-3 fatty acids 1000 MG capsule Take 1 g by mouth 3 (three) times daily.    [provider]  gabapentin (NEURONTIN) 600 MG tablet Take 1 tablet (600 mg total) by mouth daily. 04/23/21   Suzzanne Cloud, NP  Garlic (GARLIQUE PO) Take 1 tablet by mouth daily.    [provider]   HYDROcodone-acetaminophen (NORCO/VICODIN) 5-325 MG tablet Take 1 tablet by mouth 3 (three) times daily as needed. 11/06/21   [provider]  levETIRAcetam (KEPPRA) 500 MG tablet Take 1/2 tablet in the morning, take 1 tablet in the evening 04/23/21   Suzzanne Cloud, NP  losartan (COZAAR) 25 MG tablet Take 1 tablet (25 mg total) by mouth in the morning and at bedtime. 01/07/21   Imogene Burn, PA-C  niacinamide 500 MG tablet Take 500 mg by mouth 3 (three) times daily.    [provider]  oxymetazoline (AFRIN) 0.05 % nasal spray Place 1 spray into both nostrils 2 (two) times daily as needed for congestion.    [provider]  Polyethyl Glycol-Propyl Glycol 0.4-0.3 % SOLN Place 1 drop into both eyes 3 (three) times daily as needed (for dry eyes).    [provider]  pyridostigmine (MESTINON) 60 MG tablet TAKE 1 AND 1/2 TABLETS BY  MOUTH 4 TIMES DAILY 04/23/21   Suzzanne Cloud, NP  tiZANidine (ZANAFLEX) 4 MG tablet Take 4 mg by mouth at bedtime. 07/19/20   [provider]                                                                                                                                    Past Surgical History Past Surgical History:  Procedure Laterality Date   BLADDER SURGERY     removed part of the bladder   BRAIN SURGERY     CARDIOVERSION N/A 11/07/2020   Procedure: CARDIOVERSION;  Surgeon: Arnoldo Lenis, MD;  Location: [facility] ORS;  Service: Endoscopy;  Laterality: N/A;   CHOLECYSTECTOMY     COLONOSCOPY N/A 07/25/2013   Procedure: COLONOSCOPY;  Surgeon: Jamesetta So, MD;  Location: [facility] ENDO SUITE;  Service: Gastroenterology;  Laterality: N/A;   COLONOSCOPY N/A 11/10/2016   Procedure: COLONOSCOPY;  Surgeon: Aviva Signs, MD;  Location: [facility] ENDO SUITE;  Service: Gastroenterology;  Laterality: N/A;   IR CATHETER TUBE CHANGE  01/02/2017   IR RADIOLOGIST EVAL & MGMT  01/06/2017   IR RADIOLOGIST EVAL & MGMT  01/20/2017   IR RADIOLOGIST EVAL &  MGMT  02/03/2017   IR RADIOLOGIST EVAL & MGMT  02/17/2017   IR SINUS/FIST TUBE CHK-NON GI  03/03/2017   PARTIAL COLECTOMY     RADIOACTIVE SEED IMPLANT     Family History Family History  Problem Relation Age of  Onset   Heart failure Mother    Diabetes Mother    Diabetes Sister    Dementia Sister    Lung cancer Son    Heart failure Daughter    Colon cancer Neg Hx     Social History Social History   Tobacco Use   Smoking status: Never    Passive exposure: Never   Smokeless tobacco: Never  Vaping Use   Vaping Use: Never used  Substance Use Topics   Alcohol use: No   Drug use: No   Allergies Iodinated contrast media, Sulfa antibiotics, and Bactericin [bacitracin]  Review of Systems Review of Systems  Cardiovascular:  Positive for palpitations.    Physical Exam Vital Signs  I have reviewed the triage vital signs BP 138/89   Pulse 63   Temp 97.8 F (36.6 C) (Oral)   Resp 12   SpO2 100%   Physical Exam Constitutional:      General: He is not in acute distress.    Appearance: Normal appearance.  HENT:     Head: Normocephalic and atraumatic.     Nose: No congestion or rhinorrhea.  Eyes:     General:        Right eye: No discharge.        Left eye: No discharge.     Extraocular Movements: Extraocular movements intact.     Pupils: Pupils are equal, round, and reactive to light.  Cardiovascular:     Rate and Rhythm: Normal rate. Rhythm irregular.     Heart sounds: No murmur heard. Pulmonary:     Effort: No respiratory distress.     Breath sounds: No wheezing or rales.  Abdominal:     General: There is no distension.     Tenderness: There is no abdominal tenderness.  Musculoskeletal:        General: Normal range of motion.     Cervical back: Normal range of motion.  Skin:    General: Skin is warm and dry.  Neurological:     General: No focal deficit present.     Mental Status: He is alert.     ED Results and Treatments Labs (all labs ordered are  listed, but only abnormal results are displayed) Labs Reviewed  COMPREHENSIVE METABOLIC PANEL  CBC WITH DIFFERENTIAL/PLATELET                                                                                                                          Radiology DG Chest Port 1 View  Result Date: 11/13/2021 CLINICAL DATA:  Palpitations EXAM: PORTABLE CHEST 1 VIEW COMPARISON:  Chest radiograph 10/10/2020 FINDINGS: The cardiomediastinal silhouette is normal. There is no focal consolidation or pulmonary edema. There is no pleural effusion or pneumothorax There is no acute osseous abnormality. IMPRESSION: No radiographic evidence of acute cardiopulmonary process. Electronically Signed   By: Valetta Mole M.D.   On: 11/13/2021 15:46    Pertinent labs & imaging results that were available during my  care of the patient were reviewed by me and considered in my medical decision making (see MDM for details).  Medications Ordered in ED Medications  lactated ringers bolus 1,000 mL (0 mLs Intravenous Stopped 11/13/21 1719)                                                                                                                                     Procedures Procedures  (including critical care time)  Medical Decision Making / ED Course   This patient presents to the ED for concern of palpitations, this involves an extensive number of treatment options, and is a complaint that carries with it a high risk of complications and morbidity.  The differential diagnosis includes A-fib/flutter with rapid ventricular rate, electrolyte abnormality, dehydration  MDM: Patient seen in the emergency department for evaluation of rapid heart rate and palpitations.  Physical exam reveals an irregular heart beat but is otherwise unremarkable.  At time of signout, laboratory evaluation and fluid resuscitation is pending.  If laboratory evaluation is unremarkable and fluid resuscitation does not convert the patient back to  normal sinus rhythm, patient would be an appropriate candidate for bedside cardioversion.  Please see provider signout for continuation of work-up.   Additional history obtained: -Additional history obtained from multiple family members, patient's physician assistant from heart care -External records from outside source obtained and reviewed including: Chart review including previous notes, labs, imaging, consultation notes   Lab Tests: -I ordered, reviewed, and interpreted labs.   The pertinent results include:   Labs Reviewed  COMPREHENSIVE METABOLIC PANEL  CBC WITH DIFFERENTIAL/PLATELET      EKG   EKG Interpretation  Date/Time:  Thursday November 13 2021 16:30:24 EDT Ventricular Rate:  63 PR Interval:  167 QRS Duration: 98 QT Interval:  347 QTC Calculation: 356 R Axis:   54 Text Interpretation: Sinus or ectopic atrial rhythm Atrial premature complex Nonspecific repol abnormality, diffuse leads Artifact Confirmed by Fredia Sorrow [phone]) on 11/13/2021 4:59:43 PM         Imaging Studies ordered: I ordered imaging studies including chest x-ray I independently visualized and interpreted imaging. I agree with the radiologist interpretation   Medicines ordered and prescription drug management: Meds ordered this encounter  Medications   lactated ringers bolus 1,000 mL    -I have reviewed the patients home medicines and have made adjustments as needed  Critical interventions none     Cardiac Monitoring: The patient was maintained on a cardiac monitor.  I personally viewed and interpreted the cardiac monitored which showed an underlying rhythm of: Atrial flutter  Social Determinants of Health:  Factors impacting patients care include: none   Reevaluation: After the interventions noted above, I reevaluated the patient and found that they have :improved  Co morbidities that complicate the patient evaluation  Past Medical History:  Diagnosis Date   Arthritis     Atrial fibrillation ([place])  Auto immune neutropenia (HCC)    Bullous pemphigoid    Cancer (HCC)    Prostate   Cervical spondylosis without myelopathy 90/93/1121   Complication of anesthesia    difficult to wake up after anesthesia   DVT (deep venous thrombosis) ([place])    a. Has had a previous right lower extremity DVT in the setting of hospitalization and surgery (after his brain tumor was resected in 1993) but is no longer on Coumadin.   Dyslipidemia    History of prostate cancer    Hypertension    Meningioma ([place])    a. s/p resection 1990s.   Myasthenia ([employer]) 02/14/2013   Nocturnal leg cramps    Ocular myasthenia gravis (HCC)    Peripheral neuropathy 11/23/2018   Seizures ([place])    Sinus bradycardia    Sleep paralysis    Thyroid nodule, cold       Dispostion: I considered admission for this patient, and disposition will be pending laboratory evaluation and fluid resuscitation.  Please see provider signout for continuation of work-up.     Final Clinical Impression(s) / ED Diagnoses Final diagnoses:  Palpitations  Atrial flutter with rapid ventricular response [facility])     '@PCDICTATION'$ @    Teressa Lower, MD 11/13/21 2132

## 2021-11-13 NOTE — ED Provider Notes (Signed)
Patient appears to have converted back to normal sinus rhythm.  Patient did take his metoprolol medication earlier today when heart rate was fast.  Initial EKG which now we cannot find seem to be consistent with atrial flutter.  Heart rate now in the 70s and 60s.  Which is sort of baseline for him.  By monitor appears to be sinus rhythm.  Patient's labs and chest x-ray without any acute abnormalities.  Patient stable for discharge home follow-up with his cardiologist.  If his heart rate gets back up to around 100 or above would recommend taking his beta-blocker as needed.  If that does not control it and he will need to come back in.  No indication for cardioversion at this time.  Since he is back in sinus rhythm.   Fredia Sorrow, MD 11/13/21 [phone]

## 2021-11-13 NOTE — Patient Instructions (Addendum)
Medication Instructions:  Your physician recommends that you continue on your current medications as directed. Please refer to the Current Medication list given to you today.   We are taking you to the ER now.  Keep September 29 th appointment at 9 am in the [place] office with Dr.Branch

## 2021-11-13 NOTE — Discharge Instructions (Signed)
Follow-up with your cardiologist: Make an appointment.  Continue to take your Eliquis.  If your heart rate is getting up into the low 100s take your beta-blocker.  If that does not control it or if your heart rate consistently is up around 140 or 150 for 40 minutes or longer need to get seen.  And we may have to cardiovert you.  Currently you are in normal sinus rhythm.

## 2021-11-14 ENCOUNTER — Telehealth: Payer: Self-pay | Admitting: Cardiology

## 2021-11-14 ENCOUNTER — Other Ambulatory Visit: Payer: Self-pay | Admitting: Student

## 2021-11-14 DIAGNOSIS — I4892 Unspecified atrial flutter: Secondary | ICD-10-CM

## 2021-11-14 MED ORDER — METOPROLOL SUCCINATE ER 25 MG PO TB24: 25.0000 mg | ORAL_TABLET | Freq: Every day | ORAL | 4 refills | Status: DC

## 2021-11-14 NOTE — Telephone Encounter (Signed)
New Message:     Dectrice from [place] called. She wanted to give an update on patient.   STAT if HR is under 50 or over 120 (normal HR is 60-100 beats per minute)  What is your heart rate? 132 around 20 minutes  Do you have a log of your heart rate readings (document readings)?   Do you have any other symptoms?  No other symptoms

## 2021-11-14 NOTE — Telephone Encounter (Addendum)
Left message to return call with home health.I left message that we are addressing the situation as we saw him yesterday and he was sent to the ED. They do not need to call back.    B.Strader, PA-C is speaking with patient now.    DCCV cardioversion scheduled for next Thursday 11/20/21 at 10:15 am with Dr.Branch   Pre-Op is Tuesday 8/8 at 2:45 pm   Patient instructed to re-start metoprolol    I will forward to B.Strader,PA-C

## 2021-11-14 NOTE — Telephone Encounter (Signed)
     Spoke with the patient who says his rates were well controlled while in the ED but increased into the 120's to 130's last night. They were also elevated this morning and he took Toprol-XL 25 mg. In reviewing his repeat EKG prior to discharge from the ED, it appears he was still in likely atrial fibrillation/flutter but rates had improved. Reviewed tracing with Dr. Audie Box (DOD) who also felt he was still in the arrhythmia. Reviewed options with the patient and recommended plans for DCCV next week. Risk and benefits reviewed and he agrees to proceed. He remains asymptomatic at this time and we reviewed warning signs to monitor for which would warrant repeat Emergency Department evaluation.  Continued compliance with Eliquis encouraged and I also recommended he take Toprol-XL 25 mg daily for rate control in the interim but check his rates prior to taking his morning dose since he has bradycardia when in sinus rhythm. Would hold Toprol-XL the morning of his DCCV. If DCCV unsuccessful or with quick recurrence, he will need EP evaluation.   Signed, Erma Heritage, PA-C 11/14/2021, 12:07 PM Pager: [phone]

## 2021-11-18 ENCOUNTER — Encounter (HOSPITAL_COMMUNITY): Payer: Self-pay

## 2021-11-18 ENCOUNTER — Other Ambulatory Visit: Payer: Self-pay

## 2021-11-18 ENCOUNTER — Encounter (HOSPITAL_COMMUNITY)
Admission: RE | Admit: 2021-11-18 | Discharge: 2021-11-18 | Disposition: A | Discharge status: Home / Self Care | Payer: Medicare Other | Source: Ambulatory Visit | Attending: Cardiology | Admitting: Cardiology

## 2021-11-20 ENCOUNTER — Ambulatory Visit (HOSPITAL_COMMUNITY): Payer: Medicare Other | Admitting: Anesthesiology

## 2021-11-20 ENCOUNTER — Telehealth: Payer: Self-pay | Admitting: *Deleted

## 2021-11-20 ENCOUNTER — Encounter (HOSPITAL_COMMUNITY): Payer: Self-pay | Admitting: Cardiology

## 2021-11-20 ENCOUNTER — Encounter (HOSPITAL_COMMUNITY)
Admission: RE | Disposition: A | Discharge status: Home / Self Care | Payer: Self-pay | Source: Home / Self Care | Attending: Cardiology

## 2021-11-20 ENCOUNTER — Ambulatory Visit (HOSPITAL_BASED_OUTPATIENT_CLINIC_OR_DEPARTMENT_OTHER): Payer: Medicare Other | Admitting: Anesthesiology

## 2021-11-20 ENCOUNTER — Ambulatory Visit (HOSPITAL_COMMUNITY)
Admission: RE | Admit: 2021-11-20 | Discharge: 2021-11-20 | Disposition: A | Discharge status: Home / Self Care | Payer: Medicare Other | Attending: Cardiology | Admitting: Cardiology

## 2021-11-20 DIAGNOSIS — E782 Mixed hyperlipidemia: Secondary | ICD-10-CM | POA: Insufficient documentation

## 2021-11-20 DIAGNOSIS — G7000 Myasthenia gravis without (acute) exacerbation: Secondary | ICD-10-CM | POA: Insufficient documentation

## 2021-11-20 DIAGNOSIS — I4891 Unspecified atrial fibrillation: Secondary | ICD-10-CM | POA: Insufficient documentation

## 2021-11-20 DIAGNOSIS — Z79899 Other long term (current) drug therapy: Secondary | ICD-10-CM | POA: Diagnosis not present

## 2021-11-20 DIAGNOSIS — I352 Nonrheumatic aortic (valve) stenosis with insufficiency: Secondary | ICD-10-CM | POA: Insufficient documentation

## 2021-11-20 DIAGNOSIS — I10 Essential (primary) hypertension: Secondary | ICD-10-CM | POA: Diagnosis not present

## 2021-11-20 DIAGNOSIS — I4819 Other persistent atrial fibrillation: Secondary | ICD-10-CM | POA: Diagnosis not present

## 2021-11-20 DIAGNOSIS — I4892 Unspecified atrial flutter: Secondary | ICD-10-CM

## 2021-11-20 DIAGNOSIS — Z7901 Long term (current) use of anticoagulants: Secondary | ICD-10-CM | POA: Insufficient documentation

## 2021-11-20 DIAGNOSIS — E785 Hyperlipidemia, unspecified: Secondary | ICD-10-CM | POA: Insufficient documentation

## 2021-11-20 DIAGNOSIS — M1990 Unspecified osteoarthritis, unspecified site: Secondary | ICD-10-CM | POA: Diagnosis not present

## 2021-11-20 HISTORY — PX: CARDIOVERSION: SHX1299

## 2021-11-20 SURGERY — CARDIOVERSION
Anesthesia: General

## 2021-11-20 MED ORDER — CHLORHEXIDINE GLUCONATE 0.12 % MT SOLN
OROMUCOSAL | Status: AC
  Filled 2021-11-20: qty 15

## 2021-11-20 MED ORDER — PROPOFOL 10 MG/ML IV BOLUS
INTRAVENOUS | Status: DC | PRN
  Administered 2021-11-20: 40 mg via INTRAVENOUS

## 2021-11-20 MED ORDER — CHLORHEXIDINE GLUCONATE 0.12 % MT SOLN
15.0000 mL | Freq: Once | OROMUCOSAL | Status: AC
  Administered 2021-11-20: 15 mL via OROMUCOSAL

## 2021-11-20 MED ORDER — ORAL CARE MOUTH RINSE: 15.0000 mL | Freq: Once | OROMUCOSAL | Status: AC

## 2021-11-20 MED ORDER — LACTATED RINGERS IV SOLN: INTRAVENOUS | Status: DC

## 2021-11-20 NOTE — Telephone Encounter (Signed)
Called to notify pt of nurse appt to have ekg and vitals done. No answer. Left msg to call back.

## 2021-11-20 NOTE — CV Procedure (Signed)
CV Procedure Note  Procedure: Electrical cardioversion Physician: Dr Carlyle Dolly MD Indication: persistent atrial flutter   Patient presented to the procedure suite after appropriate consent was obtained. Defib pads placed int he anterior and posterior positions. Sedation achieved with the assistance of anesthesiology, please see there documentation for details. Succesfully converted from atrial flutter to sinus bradycardia with a single synchronized 200j shock. Postconversion HRs mid 40s but stable blood pressures. He will stop his toprol at discharge.   Carlyle Dolly MD

## 2021-11-20 NOTE — Telephone Encounter (Signed)
Referral placed for Structural Heart

## 2021-11-20 NOTE — H&P (Signed)
Procedure H&P  Patient presents for outpatient electrical cardioversion for atrial flutter. For complete history please refer to recent cardiology note referenced below. Plan for cardioversion today with the assistance of anesthesiology.  Carlyle Dolly MD    Cardiology Office Note     Date:  11/13/2021    ID:  Kyle Schilling., DOB January 02, 1935, MRN [id number]   PCP:  Redmond School, MD   Cardiologist: Carlyle Dolly, MD         Chief Complaint  Patient presents with   Follow-up      Elevated HR      History of Present Illness:     Kyle Schwandt. is a 87 y.o. male with past medical history of persistent atrial flutter (s/p DCCV in 10/2020), history of chest pain (normal NST in 11/2020), HTN, HLD and aortic stenosis who presents to the office today for evaluation of elevated heart rate.   He was last examined by Dr. Harl Bowie in 06/2021 and denied any recent chest pain or palpitations. He previously had bradycardia following his DCCV and ultimately lowered Toprol-XL to 12.5 mg daily and eventually stopped the medication. He was continued on Eliquis at the time of of his visit and it was recommended to have a follow-up echocardiogram in 10/2021 for reassessment of his aortic stenosis. This showed a preserved EF of 70 to 75% with no wall motion abnormalities. He did have grade 2 diastolic dysfunction, normal RV function, moderately dilated LA, trivial MR and moderate aortic stenosis.   In talking with the patient and his wife today, he reports working outside throughout the day yesterday and felt fine with this. Denies any associated chest pain, palpitations or dyspnea at that time. His wife does report he appeared more flushed. This morning upon waking, he checked his vitals and noted his heart rate was in the 130's. He brings with him today a very detailed BP and heart rate log and his heart rate is typically in the 40's to 50's at home which has led to discontinuation of AV nodal  blocking agents in the past. He says he might feel a bit more fatigued today but denies any other symptoms. He reports great compliance with Eliquis and has not missed any doses.       Past Medical History:  Diagnosis Date   Arthritis     Atrial fibrillation (HCC)     Auto immune neutropenia (HCC)     Bullous pemphigoid     Cancer (HCC)      Prostate   Cervical spondylosis without myelopathy 87/86/7672   Complication of anesthesia      difficult to wake up after anesthesia   DVT (deep venous thrombosis) ([place])      a. Has had a previous right lower extremity DVT in the setting of hospitalization and surgery (after his brain tumor was resected in 1993) but is no longer on Coumadin.   Dyslipidemia     History of prostate cancer     Hypertension     Meningioma ([place])      a. s/p resection 1990s.   Myasthenia ([place]) 02/14/2013   Nocturnal leg cramps     Ocular myasthenia gravis (HCC)     Peripheral neuropathy 11/23/2018   Seizures ([place])     Sinus bradycardia     Sleep paralysis     Thyroid nodule, cold             Past Surgical History:  Procedure Laterality Date   BLADDER  SURGERY        removed part of the bladder   BRAIN SURGERY       CARDIOVERSION N/A 11/07/2020    Procedure: CARDIOVERSION;  Surgeon: Arnoldo Lenis, MD;  Location: [facility];  Service: Endoscopy;  Laterality: N/A;   CHOLECYSTECTOMY       COLONOSCOPY N/A 07/25/2013    Procedure: COLONOSCOPY;  Surgeon: Jamesetta So, MD;  Location: [facility];  Service: Gastroenterology;  Laterality: N/A;   COLONOSCOPY N/A 11/10/2016    Procedure: COLONOSCOPY;  Surgeon: Aviva Signs, MD;  Location: [facility];  Service: Gastroenterology;  Laterality: N/A;   IR CATHETER TUBE CHANGE   01/02/2017   IR RADIOLOGIST EVAL & MGMT   01/06/2017   IR RADIOLOGIST EVAL & MGMT   01/20/2017   IR RADIOLOGIST EVAL & MGMT   02/03/2017   IR RADIOLOGIST EVAL & MGMT   02/17/2017   IR SINUS/FIST TUBE CHK-NON GI   03/03/2017   PARTIAL  COLECTOMY       RADIOACTIVE SEED IMPLANT          Current Medications:       Outpatient Medications Prior to Visit  Medication Sig Dispense Refill   acetaminophen (TYLENOL) 325 MG tablet Take 2 tablets (650 mg total) by mouth every 6 (six) hours as needed for mild pain (or Fever >/= 101). 12 tablet 4   apixaban (ELIQUIS) 5 MG TABS tablet Take 1 tablet (5 mg total) by mouth 2 (two) times daily. 60 tablet 5   Cholecalciferol (VITAMIN D) 50 MCG (2000 UT) tablet Take 8,000 Units by mouth daily.       clobetasol cream (TEMOVATE) 3.50 % Apply 1 application topically 2 (two) times daily as needed (blisters).       fish oil-omega-3 fatty acids 1000 MG capsule Take 1 g by mouth 3 (three) times daily.       gabapentin (NEURONTIN) 600 MG tablet Take 1 tablet (600 mg total) by mouth daily. 90 tablet 3   Garlic (GARLIQUE PO) Take 1 tablet by mouth daily.       HYDROcodone-acetaminophen (NORCO/VICODIN) 5-325 MG tablet Take 1 tablet by mouth 3 (three) times daily as needed.       levETIRAcetam (KEPPRA) 500 MG tablet Take 1/2 tablet in the morning, take 1 tablet in the evening 135 tablet 3   losartan (COZAAR) 25 MG tablet Take 1 tablet (25 mg total) by mouth in the morning and at bedtime. 90 tablet 3   niacinamide 500 MG tablet Take 500 mg by mouth 3 (three) times daily.       oxymetazoline (AFRIN) 0.05 % nasal spray Place 1 spray into both nostrils 2 (two) times daily as needed for congestion.       Polyethyl Glycol-Propyl Glycol 0.4-0.3 % SOLN Place 1 drop into both eyes 3 (three) times daily as needed (for dry eyes).       pyridostigmine (MESTINON) 60 MG tablet TAKE 1 AND 1/2 TABLETS BY  MOUTH 4 TIMES DAILY 540 tablet 3   tiZANidine (ZANAFLEX) 4 MG tablet Take 4 mg by mouth at bedtime.        No facility-administered medications prior to visit.      Allergies:   Iodinated contrast media, Sulfa antibiotics, and Bactericin [bacitracin]    Social History         Socioeconomic History   Marital  status: Married      Spouse name: Not on file   Number of children: 1  Years of education: 45 TH   Highest education level: Not on file  Occupational History   Occupation: retired  Tobacco Use   Smoking status: Never      Passive exposure: Never   Smokeless tobacco: Never  Vaping Use   Vaping Use: Never used  Substance and Sexual Activity   Alcohol use: No   Drug use: No   Sexual activity: Not on file  Other Topics Concern   Not on file  Social History Narrative    Patient is right handed.    Patient drinks 1 cup of coffee daily.    Social Determinants of Health    Financial Resource Strain: Not on file  Food Insecurity: Not on file  Transportation Needs: Not on file  Physical Activity: Not on file  Stress: Not on file  Social Connections: Not on file      Family History:  The patient's family history includes Dementia in his sister; Diabetes in his mother and sister; Heart failure in his daughter and mother; Lung cancer in his son.    Review of Systems:     Please see the history of present illness.      All other systems reviewed and are otherwise negative except as noted above.     Physical Exam:     VS:  BP 134/80   Pulse (!) 128   Ht '5\' 7"'$  (1.702 m)   Wt 151 lb 6.4 oz (68.7 kg)   SpO2 94%   BMI 23.71 kg/m    General: Pleasant elderly male appearing in no acute distress. Head: Normocephalic, atraumatic. Neck: No carotid bruits. JVD not elevated.  Lungs: Respirations regular and unlabored, without wheezes or rales.  Heart: Irregular irregular, tachycardiac. No S3 or S4.  2/6 SEM along RUSB.  Abdomen: Appears non-distended. No obvious abdominal masses. Msk:  Strength and tone appear normal for age. No obvious joint deformities or effusions. Extremities: No clubbing or cyanosis. No pitting edema.  Distal pedal pulses are 2+ bilaterally. Neuro: Alert and oriented X 3. Moves all extremities spontaneously. No focal deficits noted. Psych:  Responds to  questions appropriately with a normal affect. Skin: No rashes or lesions noted      Wt Readings from Last 3 Encounters:  11/13/21 151 lb 6.4 oz (68.7 kg)  07/07/21 155 lb 3.2 oz (70.4 kg)  05/29/21 154 lb (69.9 kg)        Studies/Labs Reviewed:    EKG:  EKG is ordered today.  The ekg ordered today demonstrates atrial flutter with RVR, heart rate 128 with PVC's and T wave inversion along the inferior and lateral leads which might be rate related.   Recent Labs: 05/05/2021: TSH 1.970 11/13/2021: ALT 17; BUN 22; Creatinine, Ser 0.83; Hemoglobin 13.8; Platelets 207; Potassium 3.9; Sodium 138    Lipid Panel Labs (Brief)          Component Value Date/Time    CHOL 182 10/26/2014 0912    TRIG 130 10/26/2014 0912    HDL 67 10/26/2014 0912    CHOLHDL 2.7 10/26/2014 0912    VLDL 26 10/26/2014 0912    LDLCALC 89 10/26/2014 0912        Additional studies/ records that were reviewed today include:    NST: 12/05/2020   The study is normal. The study is low risk. There are no perfusion defects consistent with prior infarct or current ischemia.   1.0 mm of horizontal ST depression in the inferior leads (II, III and aVF) was  noted.   Defect 1: There is a medium defect with moderate reduction in uptake present in the apical to basal inferior location(s). The defect is most intense in the resting images most consistent with subdiaphragmatic attenuation.   Left ventricular function is normal.   Echocardiogram: 10/15/2021 IMPRESSIONS     1. Left ventricular ejection fraction, by estimation, is 70 to 75%. The  left ventricle has hyperdynamic function. The left ventricle has no  regional wall motion abnormalities. Left ventricular diastolic parameters  are consistent with Grade II diastolic  dysfunction (pseudonormalization). The average left ventricular global  longitudinal strain is -19.9 %. The global longitudinal strain is normal.   2. Right ventricular systolic function is normal. The  right ventricular  size is normal. There is normal pulmonary artery systolic pressure.   3. Left atrial size was moderately dilated.   4. Right atrial size was mildly dilated.   5. The mitral valve is grossly normal. Trivial mitral valve  regurgitation.   6. The aortic valve is functionally bicuspid. There is moderate  calcification of the aortic valve. Aortic valve regurgitation is trivial.  Moderate aortic valve stenosis. Aortic regurgitation PHT measures 1914  msec. Aortic valve mean gradient measures  24.3 mmHg. Aortic valve Vmax measures 3.18 m/s. Dimentionless index 0.36.   7. The inferior vena cava is normal in size with greater than 50%  respiratory variability, suggesting right atrial pressure of 3 mmHg.   Comparison(s): Prior images reviewed side by side. Aortic stenosis still  in moderate range, but increased mean gradient from 10 to 24 mmHg  consistent with progression.    Assessment:     1. Atrial flutter with rapid ventricular response ([place])   2. Current use of long term anticoagulation   3. Essential hypertension   4. Mixed hyperlipidemia   5. Aortic stenosis, moderate         Plan:    In order of problems listed above:   1. Atrial Flutter with RVR - He has a history of atrial flutter and underwent successful cardioversion in 10/2020 but likely went back into the arrhythmia overnight or this morning judging by his HR/BP log. He did take Toprol-XL 25 mg at home approximately 2 hours prior to his office visit but remains tachycardic with heart rate in the 120's to 130's.  Reviewed options with the patient and his wife and given that atrial flutter is going to be challenging to manage with medical therapy and in the setting of bradycardia when in normal sinus rhythm, will send to the ED for consideration of DCCV while there. I did review this with Dr. Johney Frame (DOD) and also made the ED provider aware. He is scheduled for dental surgery next week and we reviewed that  this will need to be delayed for at least 30 days given that we cannot interrupt anticoagulation for at least 30 days following DCCV. - If DCCV is successful, would recommend keeping scheduled outpatient follow-up next month. While he is 87 years old, he is very active for his age and would consider EP referral for ablation. If not a candidate due to his age, would recommend antiarrhythmics.   2. HTN - His blood pressure is well controlled at 134/80 during today's visit. Continue current medical therapy with Losartan 25 mg twice daily.   3. HLD - Followed by his PCP. He has remained on Fish Oil as he previously had transaminitis with statins in the past by review of prior notes.   4. Aortic stenosis -  This was in a moderate range by most recent echocardiogram last month. Continue to follow.      Medication Adjustments/Labs and Tests Ordered: Current medicines are reviewed at length with the patient today.  Concerns regarding medicines are outlined above.  Medication changes, Labs and Tests ordered today are listed in the Patient Instructions below. Patient Instructions  Medication Instructions:  Your physician recommends that you continue on your current medications as directed. Please refer to the Current Medication list given to you today.     We are taking you to the ER now.   Keep September 29 th appointment at 9 am in the [place] office with Dr.Madelin Weseman    Signed, Erma Heritage, PA-C  11/13/2021 4:39 PM    [facility] [address] [place], [place] [address] Phone: [phone] Fax: [phone]

## 2021-11-20 NOTE — Anesthesia Preprocedure Evaluation (Signed)
Anesthesia Evaluation  Patient identified by MRN, date of birth, ID band Patient awake    Reviewed: Allergy & Precautions, NPO status , Patient's Chart, lab work & pertinent test results, reviewed documented beta blocker date and time   History of Anesthesia Complications (+) PROLONGED EMERGENCE and history of anesthetic complications  Airway Mallampati: III  TM Distance: >3 FB Neck ROM: Full    Dental  (+) Dental Advisory Given, Caps   Pulmonary neg pulmonary ROS,    Pulmonary exam normal breath sounds clear to auscultation       Cardiovascular METS (lower extremity weakness): hypertension, Pt. on medications and Pt. on home beta blockers + DVT  + dysrhythmias Atrial Fibrillation  Rhythm:Regular Rate:Normal + Systolic murmurs    Neuro/Psych Seizures -, Well Controlled,   Neuromuscular disease (myasthenia gravis) negative psych ROS   GI/Hepatic negative GI ROS, Neg liver ROS,   Endo/Other  negative endocrine ROS  Renal/GU negative Renal ROS  negative genitourinary   Musculoskeletal  (+) Arthritis , Osteoarthritis,    Abdominal   Peds negative pediatric ROS (+)  Hematology negative hematology ROS (+)   Anesthesia Other Findings   Reproductive/Obstetrics negative OB ROS                            Anesthesia Physical Anesthesia Plan  ASA: 3  Anesthesia Plan: General   Post-op Pain Management: Minimal or no pain anticipated   Induction: Intravenous  PONV Risk Score and Plan: TIVA  Airway Management Planned: Nasal Cannula and Natural Airway  Additional Equipment:   Intra-op Plan:   Post-operative Plan:   Informed Consent: I have reviewed the patients History and Physical, chart, labs and discussed the procedure including the risks, benefits and alternatives for the proposed anesthesia with the patient or authorized representative who has indicated his/her understanding and  acceptance.     Dental advisory given  Plan Discussed with: CRNA and Surgeon  Anesthesia Plan Comments:         Anesthesia Quick Evaluation

## 2021-11-20 NOTE — Transfer of Care (Signed)
Immediate Anesthesia Transfer of Care Note  Patient: Kyle [name].  Procedure(s) Performed: CARDIOVERSION  Patient Location: PACU  Anesthesia Type:General  Level of Consciousness: awake, alert , oriented and patient cooperative  Airway & Oxygen Therapy: Patient Spontanous Breathing and Patient connected to nasal cannula oxygen  Post-op Assessment: Report given to RN, Post -op Vital signs reviewed and stable and Patient moving all extremities  Post vital signs: Reviewed and stable  Last Vitals:  Vitals Value Taken Time  BP 88/58 11/20/21 1032  Temp    Pulse 40 11/20/21 1035  Resp 18 11/20/21 1035  SpO2 100 % 11/20/21 1035  Vitals shown include unvalidated device data.  Last Pain:  Vitals:   11/20/21 0911  TempSrc: Oral  PainSc: 0-No pain         Complications: No notable events documented.

## 2021-11-20 NOTE — Anesthesia Postprocedure Evaluation (Signed)
Anesthesia Post Note  Patient: Kyle [name].  Procedure(s) Performed: CARDIOVERSION  Patient location during evaluation: Phase II Anesthesia Type: General Level of consciousness: awake and alert and oriented Pain management: pain level controlled Vital Signs Assessment: post-procedure vital signs reviewed and stable Respiratory status: spontaneous breathing, nonlabored ventilation and respiratory function stable Cardiovascular status: blood pressure returned to baseline and stable Postop Assessment: no apparent nausea or vomiting Anesthetic complications: no   No notable events documented.   Last Vitals:  Vitals:   11/20/21 1035 11/20/21 1040  BP: (!) 88/55 (!) 95/56  Pulse: (!) 40 (!) 44  Resp: 14 15  Temp:    SpO2: 100% 100%    Last Pain:  Vitals:   11/20/21 1030  TempSrc:   PainSc: Asleep                 Martice Doty C Dorothie Wah

## 2021-11-23 DIAGNOSIS — M47816 Spondylosis without myelopathy or radiculopathy, lumbar region: Secondary | ICD-10-CM | POA: Diagnosis not present

## 2021-11-25 ENCOUNTER — Encounter (HOSPITAL_COMMUNITY): Payer: Self-pay | Admitting: Cardiology

## 2021-11-27 ENCOUNTER — Ambulatory Visit (INDEPENDENT_AMBULATORY_CARE_PROVIDER_SITE_OTHER): Payer: Medicare Other

## 2021-11-27 VITALS — BP 120/68 | HR 55 | Ht 67.0 in | Wt 151.0 lb

## 2021-11-27 DIAGNOSIS — I4892 Unspecified atrial flutter: Secondary | ICD-10-CM | POA: Diagnosis not present

## 2021-11-27 NOTE — Addendum Note (Signed)
Addended by: Christella Scheuermann C on: 11/27/2021 03:52 PM   Modules accepted: Level of Service

## 2021-11-27 NOTE — Progress Notes (Signed)
Patient presents today for nurse visit- Vitals/EKG post DCCV.  Pt stated that when he checks his bp at home, monitor tells him 1/2 time he is in a regular rhythm and the other 1/2 is irregular rhythm. Pt denies cp, sob, n/v. Pt states he is compliant with all medication.

## 2021-12-01 DIAGNOSIS — H02831 Dermatochalasis of right upper eyelid: Secondary | ICD-10-CM | POA: Diagnosis not present

## 2021-12-01 DIAGNOSIS — H25813 Combined forms of age-related cataract, bilateral: Secondary | ICD-10-CM | POA: Diagnosis not present

## 2021-12-01 DIAGNOSIS — H01001 Unspecified blepharitis right upper eyelid: Secondary | ICD-10-CM | POA: Diagnosis not present

## 2021-12-01 DIAGNOSIS — H02834 Dermatochalasis of left upper eyelid: Secondary | ICD-10-CM | POA: Diagnosis not present

## 2021-12-01 NOTE — Telephone Encounter (Signed)
Sorry, I had discussed with patient and family during his recent cardioversion. Family has interest in [place] to try to come off anticoagulation. Debilitating arthritis, did well on NSAIDs but since on anticoag has not been able to take and now severely limited by joint pains daily   Zandra Abts MD

## 2021-12-02 NOTE — Telephone Encounter (Signed)
Scheduled the patient with Dr. Quentin Ore for EP/Watchman referral on 12/16/2021. The patient was grateful for call and agrees with plan.

## 2021-12-08 DIAGNOSIS — H00021 Hordeolum internum right upper eyelid: Secondary | ICD-10-CM | POA: Diagnosis not present

## 2021-12-11 DIAGNOSIS — I129 Hypertensive chronic kidney disease with stage 1 through stage 4 chronic kidney disease, or unspecified chronic kidney disease: Secondary | ICD-10-CM | POA: Diagnosis not present

## 2021-12-11 DIAGNOSIS — N183 Chronic kidney disease, stage 3 unspecified: Secondary | ICD-10-CM | POA: Diagnosis not present

## 2021-12-15 NOTE — Progress Notes (Deleted)
Electrophysiology Office Note:    Date:  12/15/2021   ID:  Kyle [name]., DOB 1935-03-14, MRN [id number]  PCP:  Redmond School, MD  [facility] Cardiologist:  Carlyle Dolly, MD  [facility] Electrophysiologist:  None   Referring MD: Redmond School, MD   Chief Complaint: AF  History of Present Illness:    Kyle [name]. is a 87 y.o. male who presents for an evaluation of AF at the request of Dr Harl Bowie. Their medical history includes AF, prostate CA, provoked DVT, HTN, moderate AS and debilitating arthritis on chronic NSAIDS.   He underwent cardioversion for recurrent AFL with RVR 11/20/2021. Follow up ECG 11/27/2021 showed sinus rhythm.  Today he presents for consult for possible watchman given chronic debilitating arthritis requiring chronic NSAIDS.         Past Medical History:  Diagnosis Date   Arthritis    Atrial fibrillation (HCC)    Auto immune neutropenia (HCC)    Bullous pemphigoid    Cancer (HCC)    Prostate   Cervical spondylosis without myelopathy 40/11/6759   Complication of anesthesia    difficult to wake up after anesthesia   DVT (deep venous thrombosis) ([place])    a. Has had a previous right lower extremity DVT in the setting of hospitalization and surgery (after his brain tumor was resected in 1993) but is no longer on Coumadin.   Dyslipidemia    History of prostate cancer    Hypertension    Meningioma ([place])    a. s/p resection 1990s.   Myasthenia ([place]) 02/14/2013   Nocturnal leg cramps    Ocular myasthenia gravis ([place])    Peripheral neuropathy 11/23/2018   Seizures ([place])    Sinus bradycardia    Sleep paralysis    Thyroid nodule, cold     Past Surgical History:  Procedure Laterality Date   BLADDER SURGERY     removed part of the bladder   BRAIN SURGERY     CARDIOVERSION N/A 11/07/2020   Procedure: CARDIOVERSION;  Surgeon: Arnoldo Lenis, MD;  [facility]: [facility];  Service: Endoscopy;  Laterality: N/A;   CARDIOVERSION N/A  11/20/2021   Procedure: CARDIOVERSION;  Surgeon: Arnoldo Lenis, MD;  Location: [facility];  Service: Endoscopy;  Laterality: N/A;   CHOLECYSTECTOMY     COLONOSCOPY N/A 07/25/2013   Procedure: COLONOSCOPY;  Surgeon: Jamesetta So, MD;  Location[facility];  Service: Gastroenterology;  Laterality: N/A;   COLONOSCOPY N/A 11/10/2016   Procedure: COLONOSCOPY;  Surgeon: Aviva Signs, MD;  [facility];  Service: Gastroenterology;  Laterality: N/A;   IR CATHETER TUBE CHANGE  01/02/2017   IR RADIOLOGIST EVAL & MGMT  01/06/2017   IR RADIOLOGIST EVAL & MGMT  01/20/2017   IR RADIOLOGIST EVAL & MGMT  02/03/2017   IR RADIOLOGIST EVAL & MGMT  02/17/2017   IR SINUS/FIST TUBE CHK-NON GI  03/03/2017   PARTIAL COLECTOMY     RADIOACTIVE SEED IMPLANT      Current Medications: No outpatient medications have been marked as taking for the 12/16/21 encounter (Appointment) with Vickie Epley, MD.     Allergies:   Iodinated contrast media, Sulfa antibiotics, and Bactericin [bacitracin]   Social History   Socioeconomic History   Marital status: Married    Spouse name: Not on file   Number of children: 1   Years of education: 30 TH   Highest education level: Not on file  Occupational History   Occupation: retired  Tobacco Use   Smoking status: Never    Passive exposure: Never   Smokeless tobacco: Never  Vaping Use   Vaping Use: Never used  Substance and Sexual Activity   Alcohol use: No   Drug use: No   Sexual activity: Not on file  Other Topics Concern   Not on file  Social History Narrative   Patient is right handed.   Patient drinks 1 cup of coffee daily.   Social Determinants of Health   Financial Resource Strain: Not on file  Food Insecurity: Not on file  Transportation Needs: Not on file  Physical Activity: Not on file  Stress: Not on file  Social Connections: Not on file     Family History: The patient's family history includes Dementia in his sister;  Diabetes in his mother and sister; Heart failure in his daughter and mother; Lung cancer in his son. There is no history of Colon cancer.  ROS:   Please see the history of present illness.    All other systems reviewed and are negative.  EKGs/Labs/Other Studies Reviewed:    The following studies were reviewed today:  10/15/2021 Echo EF 70 RV normal LA moderately dilated RA mildly dilated Trivial MR Moderate AS   EKG:  The ekg ordered today demonstrates ***   Recent Labs: 05/05/2021: TSH 1.970 11/13/2021: ALT 17; BUN 22; Creatinine, Ser 0.83; Hemoglobin 13.8; Platelets 207; Potassium 3.9; Sodium 138  Recent Lipid Panel    Component Value Date/Time   CHOL 182 10/26/2014 0912   TRIG 130 10/26/2014 0912   HDL 67 10/26/2014 0912   CHOLHDL 2.7 10/26/2014 0912   VLDL 26 10/26/2014 0912   LDLCALC 89 10/26/2014 0912    Physical Exam:    VS:  There were no vitals taken for this visit.    Wt Readings from Last 3 Encounters:  11/27/21 151 lb (68.5 kg)  11/20/21 150 lb (68 kg)  11/18/21 151 lb 6.4 oz (68.7 kg)     GEN: *** Well nourished, well developed in no acute distress HEENT: Normal NECK: No JVD; No carotid bruits LYMPHATICS: No lymphadenopathy CARDIAC: ***RRR, no murmurs, rubs, gallops RESPIRATORY:  Clear to auscultation without rales, wheezing or rhonchi  ABDOMEN: Soft, non-tender, non-distended MUSCULOSKELETAL:  No edema; No deformity  SKIN: Warm and dry NEUROLOGIC:  Alert and oriented x 3 PSYCHIATRIC:  Normal affect       ASSESSMENT:    No diagnosis found. PLAN:    In order of problems listed above:  #AF/AFL   I have seen Kyle [name]. in the office today who is being considered for a Watchman left atrial appendage closure device. I believe they will benefit from this procedure given their history of atrial fibrillation, CHA2DS2-VASc score of 3 and unadjusted ischemic stroke rate of 3.2% per year. Unfortunately, the patient is not felt to be a long  term anticoagulation candidate secondary to debilitating arthritis with chronic NSAID use. The patient's chart has been reviewed and I feel that they would be a candidate for short term oral anticoagulation after Watchman implant.   It is my belief that after undergoing a LAA closure procedure, Kyle [name]. will not need long term anticoagulation which eliminates anticoagulation side effects and major bleeding risk.   Procedural risks for the Watchman implant have been reviewed with the patient including a 0.5% risk of stroke, <1% risk of perforation and <1% risk of device embolization. Other risks include bleeding, vascular damage, tamponade, worsening renal function,  and death. The patient understands these risk and wishes to proceed.     The published clinical data on the safety and effectiveness of WATCHMAN include but are not limited to the following: - Holmes DR, Mechele Claude, Sick P et al. for the PROTECT AF Investigators. Percutaneous closure of the left atrial appendage versus warfarin therapy for prevention of stroke in patients with atrial fibrillation: a randomised non-inferiority trial. Lancet 2009; 374: 534-42. Mechele Claude, Doshi SK, Abelardo Diesel D et al. on behalf of the PROTECT AF Investigators. Percutaneous Left Atrial Appendage Closure for Stroke Prophylaxis in Patients With Atrial Fibrillation 2.3-Year Follow-up of the PROTECT AF (Watchman Left Atrial Appendage System for Embolic Protection in Patients With Atrial Fibrillation) Trial. Circulation 2013; 127:720-729. - Alli O, Doshi S,  Kar S, Reddy VY, Sievert H et al. Quality of Life Assessment in the Randomized PROTECT AF (Percutaneous Closure of the Left Atrial Appendage Versus Warfarin Therapy for Prevention of Stroke in Patients With Atrial Fibrillation) Trial of Patients at Risk for Stroke With Nonvalvular Atrial Fibrillation. J Am Coll Cardiol 2013; 28:3662-9. Vertell Limber DR, Tarri Abernethy, Price M, Brownlee, Sievert H, Doshi S,  Huber K, Reddy V. Prospective randomized evaluation of the Watchman left atrial appendage Device in patients with atrial fibrillation versus long-term warfarin therapy; the PREVAIL trial. Journal of the [employer] of Cardiology, Vol. 4, No. 1, 2014, 1-11. - Kar S, Doshi SK, Sadhu A, Horton R, Osorio J et al. Primary outcome evaluation of a next-generation left atrial appendage closure device: results from the PINNACLE FLX trial. Circulation 2021;143(18)1754-1762.    After today's visit with the patient which was dedicated solely for shared decision making visit regarding LAA closure device, the patient decided to proceed with the LAA appendage closure procedure scheduled to be done in the near future at [facility]. Prior to the procedure, I would like to obtain a gated CT scan of the chest with contrast timed for PV/LA visualization.   HAS-BLED score 3 Hypertension Yes  Abnormal renal and liver function (Dialysis, transplant, Cr >2.26 mg/dL /Cirrhosis or Bilirubin >2x Normal or AST/ALT/AP >3x Normal) No  Stroke No  Bleeding No  Labile INR (Unstable/high INR) No  Elderly (>65) Yes  Drugs or alcohol (? 8 drinks/week, anti-plt or NSAID) Yes   CHA2DS2-VASc Score = 3  The patient's score is based upon: CHF History: 0 HTN History: 1 Diabetes History: 0 Stroke History: 0 Vascular Disease History: 0 Age Score: 2 Gender Score: 0     Medication Adjustments/Labs and Tests Ordered: Current medicines are reviewed at length with the patient today.  Concerns regarding medicines are outlined above.  No orders of the defined types were placed in this encounter.  No orders of the defined types were placed in this encounter.    Signed, Hilton Cork. Quentin Ore, MD, [facility], [facility] 12/15/2021 6:43 PM    Electrophysiology [facility]

## 2021-12-16 ENCOUNTER — Encounter: Payer: Self-pay | Admitting: Cardiology

## 2021-12-16 ENCOUNTER — Ambulatory Visit: Payer: Medicare Other | Attending: Cardiology | Admitting: Cardiology

## 2021-12-16 ENCOUNTER — Encounter: Payer: Self-pay | Admitting: *Deleted

## 2021-12-16 VITALS — BP 130/70 | HR 49 | Ht 67.0 in | Wt 152.0 lb

## 2021-12-16 DIAGNOSIS — K5720 Diverticulitis of large intestine with perforation and abscess without bleeding: Secondary | ICD-10-CM

## 2021-12-16 DIAGNOSIS — I350 Nonrheumatic aortic (valve) stenosis: Secondary | ICD-10-CM | POA: Diagnosis not present

## 2021-12-16 DIAGNOSIS — I4891 Unspecified atrial fibrillation: Secondary | ICD-10-CM | POA: Diagnosis not present

## 2021-12-16 DIAGNOSIS — K631 Perforation of intestine (nontraumatic): Secondary | ICD-10-CM

## 2021-12-16 DIAGNOSIS — I10 Essential (primary) hypertension: Secondary | ICD-10-CM | POA: Diagnosis not present

## 2021-12-16 MED ORDER — PREDNISONE 50 MG PO TABS: ORAL_TABLET | ORAL | 0 refills | Status: DC

## 2021-12-16 NOTE — Patient Instructions (Signed)
Medication Instructions:  none *If you need a refill on your cardiac medications before your next appointment, please call your pharmacy*   Lab Work: BMP If you have labs (blood work) drawn today and your tests are completely normal, you will receive your results only by: [place] (if you have MyChart) OR A paper copy in the mail If you have any lab test that is abnormal or we need to change your treatment, we will call you to review the results.   Testing/Procedures: Your physician has requested that you have cardiac CT. Cardiac computed tomography (CT) is a painless test that uses an x-ray machine to take clear, detailed pictures of your heart. For further information please visit [email or web address]. Please follow instruction sheet as given.   Your physician has requested that you have Left atrial appendage (LAA) closure device implantation is a procedure to put a small device in the LAA of the heart. The LAA is a small sac in the wall of the heart's left upper chamber. Blood clots can form in this area. The device, Watchman closes the LAA to help prevent a blood clot and stroke.  Follow-Up: At [facility], you and your health needs are our priority.  As part of our continuing mission to provide you with exceptional heart care, we have created designated Provider Care Teams.  These Care Teams include your primary Cardiologist (physician) and Advanced Practice Providers (APPs -  Physician Assistants and Nurse Practitioners) who all work together to provide you with the care you need, when you need it.  We recommend signing up for the patient portal called "MyChart".  Sign up information is provided on this After Visit Summary.  MyChart is used to connect with patients for Virtual Visits (Telemedicine).  Patients are able to view lab/test results, encounter notes, upcoming appointments, etc.  Non-urgent messages can be sent to your provider as well.   To learn more about what  you can do with MyChart, go to [email or web address].    Your next appointment:   Lenice Llamas, the Watchman Nurse Navigator, will call you after your CT once the [facility] Team has reviewed your imaging for an update on proceedings. Kat[place]'s direct number is [phone] if you need assistance.    Important Information About Sugar

## 2021-12-16 NOTE — Progress Notes (Addendum)
Electrophysiology Office Note:    Date:  12/16/2021   ID:  Kyle Schilling., DOB March 13, 1935, MRN [id number]  PCP:  Redmond School, MD  [facility] Cardiologist:  Carlyle Dolly, MD  [facility] Electrophysiologist:  Vickie Epley, MD   Referring MD: Redmond School, MD   Chief Complaint: AF  History of Present Illness:    Kyle Kisner. is a 87 y.o. male who presents for an evaluation of AF at the request of Dr Harl Bowie. Their medical history includes AF, prostate CA, provoked DVT, HTN, moderate AS and debilitating arthritis on chronic NSAIDS.    He underwent cardioversion for recurrent AFL with RVR 11/20/2021. Follow up ECG 11/27/2021 showed sinus rhythm.  Today he presents for consult for possible watchman given chronic debilitating arthritis requiring chronic NSAIDS.  He is accompanied by a family member. He confirms that he was initially diagnosed with atrial fibrillation at the end of 09/2020.  Since 3 years ago his back pain has worsened. He had received spinal injections which were ineffective.   He denies any palpitations, chest pain, shortness of breath, or peripheral edema. No lightheadedness, headaches, syncope, orthopnea, or PND.     Past Medical History:  Diagnosis Date   Arthritis    Atrial fibrillation (HCC)    Auto immune neutropenia (HCC)    Bullous pemphigoid    Cancer (HCC)    Prostate   Cervical spondylosis without myelopathy 97/67/3419   Complication of anesthesia    difficult to wake up after anesthesia   DVT (deep venous thrombosis) ([place])    a. Has had a previous right lower extremity DVT in the setting of hospitalization and surgery (after his brain tumor was resected in 1993) but is no longer on Coumadin.   Dyslipidemia    History of prostate cancer    Hypertension    Meningioma ([place])    a. s/p resection 1990s.   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[place])    Peripheral neuropathy  11/23/2018   Seizures ([place])    Sinus bradycardia    Sleep paralysis    Thyroid nodule, cold     Past Surgical History:  Procedure Laterality Date   BLADDER SURGERY     removed part of the bladder   BRAIN SURGERY     CARDIOVERSION N/A 11/07/2020   Procedure: CARDIOVERSION;  Surgeon: Arnoldo Lenis, MD;  Location: [facility];  Service: Endoscopy;  Laterality: N/A;   CARDIOVERSION N/A 11/20/2021   Procedure: CARDIOVERSION;  Surgeon: Arnoldo Lenis, MD;  Location: [facility];  Service: Endoscopy;  Laterality: N/A;   CHOLECYSTECTOMY     COLONOSCOPY N/A 07/25/2013   Procedure: COLONOSCOPY;  Surgeon: Jamesetta So, MD;  Location[facility];  Service: Gastroenterology;  Laterality: N/A;   COLONOSCOPY N/A 11/10/2016   Procedure: COLONOSCOPY;  Surgeon: Aviva Signs, MD;  Location[facility];  Service: Gastroenterology;  Laterality: N/A;   IR CATHETER TUBE CHANGE  01/02/2017   IR RADIOLOGIST EVAL & MGMT  01/06/2017   IR RADIOLOGIST EVAL & MGMT  01/20/2017   IR RADIOLOGIST EVAL & MGMT  02/03/2017   IR RADIOLOGIST EVAL & MGMT  02/17/2017   IR SINUS/FIST TUBE CHK-NON GI  03/03/2017   PARTIAL COLECTOMY     RADIOACTIVE SEED IMPLANT      Current Medications: Current Meds  Medication Sig   acetaminophen (TYLENOL) 325 MG tablet Take 2 tablets (650 mg total) by mouth every  6 (six) hours as needed for mild pain (or Fever >/= 101).   apixaban (ELIQUIS) 5 MG TABS tablet Take 1 tablet (5 mg total) by mouth 2 (two) times daily.   Cholecalciferol (VITAMIN D) 50 MCG (2000 UT) tablet Take 8,000 Units by mouth daily.   clobetasol cream (TEMOVATE) 1.06 % Apply 1 application topically 2 (two) times daily as needed (blisters).   fish oil-omega-3 fatty acids 1000 MG capsule Take 1 g by mouth 3 (three) times daily.   gabapentin (NEURONTIN) 600 MG tablet Take 1 tablet (600 mg total) by mouth daily.   Garlic (GARLIQUE PO) Take 1 tablet by mouth daily.   HYDROcodone-acetaminophen (NORCO/VICODIN) 5-325 MG  tablet Take 1 tablet by mouth 2 (two) times daily.   levETIRAcetam (KEPPRA) 500 MG tablet Take 1/2 tablet in the morning, take 1 tablet in the evening   losartan (COZAAR) 25 MG tablet Take 1 tablet (25 mg total) by mouth in the morning and at bedtime.   magnesium oxide (MAG-OX) 400 (240 Mg) MG tablet Take 400 mg by mouth at bedtime.   niacinamide 500 MG tablet Take 500 mg by mouth 3 (three) times daily.   oxymetazoline (AFRIN) 0.05 % nasal spray Place 1 spray into both nostrils 2 (two) times daily as needed for congestion.   Polyethyl Glycol-Propyl Glycol 0.4-0.3 % SOLN Place 1 drop into both eyes 2 (two) times daily.   predniSONE (DELTASONE) 50 MG tablet For Cardiac CT: Prednisone 50 mg - take 13 hours prior to test Take another Prednisone 50 mg 7 hours prior to test Take another Prednisone 50 mg 1 hour prior to test Take Benadryl 50 mg 1 hour prior to test   pyridostigmine (MESTINON) 60 MG tablet TAKE 1 AND 1/2 TABLETS BY  MOUTH 4 TIMES DAILY   tiZANidine (ZANAFLEX) 4 MG tablet Take 4 mg by mouth at bedtime.     Allergies:   Iodinated contrast media, Sulfa antibiotics, and Bactericin [bacitracin]   Social History   Socioeconomic History   Marital status: Married    Spouse name: Not on file   Number of children: 1   Years of education: 73 TH   Highest education level: Not on file  Occupational History   Occupation: retired  Tobacco Use   Smoking status: Never    Passive exposure: Never   Smokeless tobacco: Never  Vaping Use   Vaping Use: Never used  Substance and Sexual Activity   Alcohol use: No   Drug use: No   Sexual activity: Not on file  Other Topics Concern   Not on file  Social History Narrative   Patient is right handed.   Patient drinks 1 cup of coffee daily.   Social Determinants of Health   Financial Resource Strain: Not on file  Food Insecurity: Not on file  Transportation Needs: Not on file  Physical Activity: Not on file  Stress: Not on file  Social  Connections: Not on file     Family History: The patient's family history includes Dementia in his sister; Diabetes in his mother and sister; Heart failure in his daughter and mother; Lung cancer in his son. There is no history of Colon cancer.  ROS:   Please see the history of present illness.    (+) Arthralgias (+) Back pain All other systems reviewed and are negative.  EKGs/Labs/Other Studies Reviewed:    The following studies were reviewed today:  10/15/2021 Echo EF 70 RV normal LA moderately dilated RA mildly dilated Trivial  MR Moderate AS   EKG:   EKG is personally reviewed.  12/16/2021:  sinus rhythm   Recent Labs: 05/05/2021: TSH 1.970 11/13/2021: ALT 17; BUN 22; Creatinine, Ser 0.83; Hemoglobin 13.8; Platelets 207; Potassium 3.9; Sodium 138   Recent Lipid Panel    Component Value Date/Time   CHOL 182 10/26/2014 0912   TRIG 130 10/26/2014 0912   HDL 67 10/26/2014 0912   CHOLHDL 2.7 10/26/2014 0912   VLDL 26 10/26/2014 0912   LDLCALC 89 10/26/2014 0912    Physical Exam:    VS:  BP 130/70 (BP Location: Left Arm, Patient Position: Sitting, Cuff Size: Normal)   Pulse (!) 49   Ht '5\' 7"'$  (1.702 m)   Wt 152 lb (68.9 kg)   BMI 23.81 kg/m     Wt Readings from Last 3 Encounters:  12/16/21 152 lb (68.9 kg)  11/27/21 151 lb (68.5 kg)  11/20/21 150 lb (68 kg)     GEN: Well nourished, well developed in no acute distress. Elderly, appears younger than stated age 72: Normal NECK: No JVD; No carotid bruits LYMPHATICS: No lymphadenopathy CARDIAC: RRR, III/VI crescendo-decrescendo murmur at the upper sternal borders. RESPIRATORY:  Clear to auscultation without rales, wheezing or rhonchi  ABDOMEN: Soft, non-tender, non-distended MUSCULOSKELETAL:  No edema; No deformity  SKIN: Warm and dry NEUROLOGIC:  Alert and oriented x 3 PSYCHIATRIC:  Normal affect       ASSESSMENT:    1. New onset a-fib/Flutter    2. Primary hypertension   3. Aortic stenosis,  moderate/Aortic valve mean gradient is 10.0 mmHg   4. Bowel perforation (HCC)    PLAN:    In order of problems listed above:  #AF/AFL Maintaining sinus rhythm after his cardioversion.  Currently on Eliquis for stroke prophylaxis.  He would like to avoid long-term use of anticoagulation given history of severe arthritis requiring daily NSAIDs.   I have seen Kyle Schilling. in the office today who is being considered for a Watchman left atrial appendage closure device. I believe they will benefit from this procedure given their history of atrial fibrillation, CHA2DS2-VASc score of 3 and unadjusted ischemic stroke rate of 3.2% per year. Unfortunately, the patient is not felt to be a long term anticoagulation candidate secondary to debilitating arthritis with chronic NSAID use. The patient's chart has been reviewed and I feel that they would be a candidate for short term oral anticoagulation after Watchman implant.     Procedural risks for the Watchman implant have been reviewed with the patient including a 0.5% risk of stroke, <1% risk of perforation and <1% risk of device embolization. Other risks include bleeding, vascular damage, tamponade, worsening renal function, and death. The patient understands these risks.  Unfortunately, the patient has a severe allergy to contrast media.  Even with premedication, the patient has had severe allergic reactions to contrast including skin sloughing and oral ulcerations.  We could consider not doing a CT scan preprocedure but I do require contrast medium during the procedure itself to measure the left atrial appendage dimensions and to assess for proper closure of the appendage.  For this reason, I do not think a percutaneous left atrial appendage occlusion is going to be possible.  I did briefly discuss minimally invasive surgical clipping of the left atrial appendage.  This can be done off-pump.  I will send a note to Dr. Harl Bowie to discuss this as a  possibility.  The patient will follow up with me on an as-needed  basis.      HAS-BLED score 3 Hypertension Yes  Abnormal renal and liver function (Dialysis, transplant, Cr >2.26 mg/dL /Cirrhosis or Bilirubin >2x Normal or AST/ALT/AP >3x Normal) No  Stroke No  Bleeding No  Labile INR (Unstable/high INR) No  Elderly (>65) Yes  Drugs or alcohol (? 8 drinks/week, anti-plt or NSAID) Yes    CHA2DS2-VASc Score = 3  The patient's score is based upon: CHF History: 0 HTN History: 1 Diabetes History: 0 Stroke History: 0 Vascular Disease History: 0 Age Score: 2 Gender Score: 0    I,Mathew Stumpf,acting as a scribe for Vickie Epley, MD.,have documented all relevant documentation on the behalf of Vickie Epley, MD,as directed by  Vickie Epley, MD while in the presence of Vickie Epley, MD.  I, Vickie Epley, MD, have reviewed all documentation for this visit. The documentation on 12/16/21 for the exam, diagnosis, procedures, and orders are all accurate and complete.   Signed, Kyle Cork. Quentin Ore, MD, [facility], [facility] 12/16/2021 4:06 PM    Electrophysiology [facility]

## 2021-12-18 ENCOUNTER — Telehealth: Payer: Self-pay | Admitting: *Deleted

## 2021-12-18 ENCOUNTER — Encounter: Payer: Self-pay | Admitting: *Deleted

## 2021-12-18 NOTE — Patient Outreach (Signed)
  Care Coordination   Initial Visit Note   12/18/2021 Name: Kyle [name]. MRN: [id number] DOB: 1935-03-03  Kyle [name]. is a 87 y.o. year old male who sees Redmond School, MD for primary care. I spoke with  Kyle [name]. by phone today.  What matters to the patients health and wellness today?  Management of Afib and cost of Eliquis    Goals Addressed             This Visit's Progress    Care Coordination Services       Care Coordination Interventions: Evaluation of current treatment plan related to Afib and patient's adherence to plan as established by provider Reviewed medications with patient and discussed cost of Eliquis. Paying $140 per month now that he is in the Medicare Coverage Gap. Has discussed other options with cardio like the watchmen device. He is unable to tolerate contrast medium so that is not an option. Was told there is a second surgical option and will f/u with cardio about that at next visit.  Reviewed scheduled/upcoming provider appointments including Dr Harl Bowie on 01/09/22 Assessed social determinant of health barriers Assessed mobility and ability to perform ADLs Assessed family/Social support Provided patient/caregiver with verbal information on [place] [phone]) Telephone f/u scheduled with Valente David, RN Care Coordinator [phone]) for 01/16/22 at 10:00 Provided with [facility] contact information         SDOH assessments and interventions completed:  Yes  SDOH Interventions Today    Flowsheet Row Most Recent Value  SDOH Interventions   Housing Interventions Intervention Not Indicated  Transportation Interventions Intervention Not Indicated  Utilities Interventions Intervention Not Indicated  Financial Strain Interventions Intervention Not Indicated        Care Coordination Interventions Activated:  Yes  Care Coordination Interventions:  Yes, provided   Follow up plan: Follow up call scheduled for  01/16/22 at 10:00 with Valente David, RN Care Coordinator [phone])  Encounter Outcome:  Pt. Visit Completed   Chong Sicilian, BSN, RN-BC [place] / [place] Direct Dial: [phone]

## 2021-12-23 ENCOUNTER — Encounter (HOSPITAL_COMMUNITY): Payer: Self-pay

## 2021-12-23 ENCOUNTER — Telehealth: Payer: Self-pay | Admitting: Cardiology

## 2021-12-23 ENCOUNTER — Encounter (HOSPITAL_COMMUNITY)
Admission: RE | Admit: 2021-12-23 | Discharge: 2021-12-23 | Disposition: A | Discharge status: Home / Self Care | Payer: Medicare Other | Source: Ambulatory Visit | Attending: Ophthalmology | Admitting: Ophthalmology

## 2021-12-23 HISTORY — DX: Cardiac arrhythmia, unspecified: I49.9

## 2021-12-23 NOTE — H&P (Signed)
Surgical History & Physical  Patient Name: Kyle [name] DOB: [birth year]  Surgery: Cataract extraction with intraocular lens implant phacoemulsification; Left Eye  Surgeon: Baruch Goldmann MD Surgery Date:  12-26-21 Pre-Op Date:  12-01-21  HPI: A 34 Yr. old male patient present for cataract evaluation per Dr. Jorja Loa (my eye doctor). 1. 1. The patient complains of difficulty when reading fine print, which began 4 months ago. Both eyes are affected. The symptom is constant. The condition's severity decreased since last visit. This is negatively affecting the patient's quality of life and the patient is unable to function adequately in life with the current level of vision. Uses Systane BID OU. HPI was performed by Baruch Goldmann .  Medical History: Arthritis Heart Problem High Blood Pressure Seizure, Myasthemia Gravis, Bullous Phemphigoid  Review of Systems Negative Allergic/Immunologic Negative Cardiovascular Negative Constitutional Negative Ear, Nose, Mouth & Throat Negative Endocrine Negative Eyes Negative Gastrointestinal Negative Genitourinary Negative Hemotologic/Lymphatic Negative Integumentary Negative Musculoskeletal Negative Neurological Negative Psychiatry Negative Respiratory  Social   Never smoked   Medication  Keppra (seizure), Losartan, Pyridostigmine, Niacinamide, Eliquis, Garlic, Hydrocodone, Gabapentin, Tizandine,   Sx/Procedures Brain Tumor Sx, Gallbladder Sx, Prostate Seeding, Part of colon and bladder removed,   Drug Allergies  Sulfa, CT Scan Dye,   History & Physical: Heent:  Cataract, left eye NECK: supple without bruits LUNGS: lungs clear to auscultation CV: regular rate and rhythm Abdomen: soft and non-tender Impression & Plan: Assessment: 1.  COMBINED FORMS AGE RELATED CATARACT; Both Eyes (H25.813) 2.  DERMATOCHALASIS, no surgery; Right Upper Lid, Left Upper Lid (H02.831, H02.834) 3.  BLEPHARITIS; Right Upper Lid, Right Lower Lid, Left  Upper Lid, Left Lower Lid (H01.001, H01.002,H01.004,H01.005) 4.  ARCUS SENILIS; Both Eyes (H18.413) 5.  Pinguecula; Both Eyes (H11.153) 6.  VITREOUS DETACHMENT PVD; Right Eye (H43.811) 7.  ASTIGMATISM, REGULAR; Both Eyes (H52.223)  Plan: 1.  Cataract accounts for the patient's decreased vision. This visual impairment is not correctable with a tolerable change in glasses or contact lenses. Cataract surgery with an implantation of a new lens should significantly improve the visual and functional status of the patient. Discussed all risks, benefits, alternatives, and potential complications. Discussed the procedures and recovery. Patient desires to have surgery. A-scan ordered and performed today for intra-ocular lens calculations. The surgery will be performed in order to improve vision for driving, reading, and for eye examinations. Recommend phacoemulsification with intra-ocular lens. Recommend Dextenza for post-operative pain and inflammation. Left Eye. Dilates poorly - shugarcaine by protocol. Malyugin Ring. Omidira. Consider Synergy Lens Toric.  2.  Asymptomatic, recommend observation for now. Findings, prognosis and treatment options reviewed.  3.  Recommend regular lid cleaning.  4.  Discussed significance of finding Answered patient questions about finding  5.  Observe; Artificial tears as needed for irritation.  6.  Old Asymptomatic. RD precautions given. Patient to call with increase in flashing lights/floaters/dark curtain.  7.  Toric IOL if multifocal.

## 2021-12-23 NOTE — Telephone Encounter (Signed)
Pt states that he checked his BP and his machine states that his HR is irregular. BP is 100/68 with Hr of 70. Pt denies being dizzy, SOB, and no passing out. Pt reports having a DCCV on 11/20/21. Pt states that he feels fine. He does report that this morning his HR was 110. Please advise.

## 2021-12-23 NOTE — Telephone Encounter (Signed)
Called to schedule nurse visit on today or tomorrow for EKG and vitals, no answer, left msg to call back.

## 2021-12-23 NOTE — Telephone Encounter (Signed)
Patient states he is returning a call, but he does not know what it was regarding.

## 2021-12-23 NOTE — Telephone Encounter (Signed)
Spoke with pt and he will come to office on tomorrow @ 3:45 for vitals and EKG.

## 2021-12-23 NOTE — Telephone Encounter (Signed)
Nursing visit for EKG and vitals please   Zandra Abts MD

## 2021-12-24 ENCOUNTER — Ambulatory Visit: Payer: Medicare Other | Attending: Cardiology | Admitting: *Deleted

## 2021-12-24 VITALS — BP 120/80 | HR 93 | Ht 67.0 in | Wt 153.0 lb

## 2021-12-24 DIAGNOSIS — I4892 Unspecified atrial flutter: Secondary | ICD-10-CM | POA: Diagnosis not present

## 2021-12-24 NOTE — Progress Notes (Signed)
Pt in office for EKG and vitals

## 2021-12-25 ENCOUNTER — Encounter (HOSPITAL_COMMUNITY): Payer: Self-pay | Admitting: Anesthesiology

## 2021-12-25 NOTE — Addendum Note (Signed)
Addended by: Levonne Hubert on: 12/25/2021 02:17 PM   Modules accepted: Orders

## 2021-12-26 ENCOUNTER — Other Ambulatory Visit: Payer: Self-pay

## 2021-12-26 ENCOUNTER — Encounter (HOSPITAL_COMMUNITY)
Admission: RE | Disposition: A | Discharge status: Home / Self Care | Payer: Self-pay | Source: Home / Self Care | Attending: Emergency Medicine

## 2021-12-26 ENCOUNTER — Ambulatory Visit (HOSPITAL_COMMUNITY)
Admission: RE | Admit: 2021-12-26 | Discharge: 2021-12-26 | Disposition: A | Discharge status: Home / Self Care | Payer: Medicare Other | Attending: Emergency Medicine | Admitting: Emergency Medicine

## 2021-12-26 ENCOUNTER — Encounter (HOSPITAL_COMMUNITY): Payer: Self-pay | Admitting: Ophthalmology

## 2021-12-26 DIAGNOSIS — H01004 Unspecified blepharitis left upper eyelid: Secondary | ICD-10-CM | POA: Insufficient documentation

## 2021-12-26 DIAGNOSIS — Z86718 Personal history of other venous thrombosis and embolism: Secondary | ICD-10-CM | POA: Diagnosis not present

## 2021-12-26 DIAGNOSIS — H02834 Dermatochalasis of left upper eyelid: Secondary | ICD-10-CM | POA: Diagnosis not present

## 2021-12-26 DIAGNOSIS — I4892 Unspecified atrial flutter: Secondary | ICD-10-CM | POA: Diagnosis not present

## 2021-12-26 DIAGNOSIS — H43811 Vitreous degeneration, right eye: Secondary | ICD-10-CM | POA: Diagnosis not present

## 2021-12-26 DIAGNOSIS — H11153 Pinguecula, bilateral: Secondary | ICD-10-CM | POA: Insufficient documentation

## 2021-12-26 DIAGNOSIS — I480 Paroxysmal atrial fibrillation: Secondary | ICD-10-CM | POA: Diagnosis not present

## 2021-12-26 DIAGNOSIS — H25812 Combined forms of age-related cataract, left eye: Secondary | ICD-10-CM

## 2021-12-26 DIAGNOSIS — H01002 Unspecified blepharitis right lower eyelid: Secondary | ICD-10-CM | POA: Insufficient documentation

## 2021-12-26 DIAGNOSIS — H02831 Dermatochalasis of right upper eyelid: Secondary | ICD-10-CM | POA: Diagnosis not present

## 2021-12-26 DIAGNOSIS — H25813 Combined forms of age-related cataract, bilateral: Secondary | ICD-10-CM | POA: Diagnosis not present

## 2021-12-26 DIAGNOSIS — I493 Ventricular premature depolarization: Secondary | ICD-10-CM | POA: Diagnosis not present

## 2021-12-26 DIAGNOSIS — C61 Malignant neoplasm of prostate: Secondary | ICD-10-CM | POA: Insufficient documentation

## 2021-12-26 DIAGNOSIS — H52223 Regular astigmatism, bilateral: Secondary | ICD-10-CM | POA: Diagnosis not present

## 2021-12-26 DIAGNOSIS — I10 Essential (primary) hypertension: Secondary | ICD-10-CM | POA: Diagnosis not present

## 2021-12-26 DIAGNOSIS — H01001 Unspecified blepharitis right upper eyelid: Secondary | ICD-10-CM | POA: Diagnosis not present

## 2021-12-26 DIAGNOSIS — Z7901 Long term (current) use of anticoagulants: Secondary | ICD-10-CM | POA: Diagnosis not present

## 2021-12-26 DIAGNOSIS — Z79899 Other long term (current) drug therapy: Secondary | ICD-10-CM | POA: Insufficient documentation

## 2021-12-26 DIAGNOSIS — H18413 Arcus senilis, bilateral: Secondary | ICD-10-CM | POA: Insufficient documentation

## 2021-12-26 DIAGNOSIS — H01005 Unspecified blepharitis left lower eyelid: Secondary | ICD-10-CM | POA: Diagnosis not present

## 2021-12-26 LAB — CBC WITH DIFFERENTIAL/PLATELET
Abs Immature Granulocytes: 0.02 10*3/uL (ref 0.00–0.07)
Basophils Absolute: 0 10*3/uL (ref 0.0–0.1)
Basophils Relative: 1 %
Eosinophils Absolute: 0.1 10*3/uL (ref 0.0–0.5)
Eosinophils Relative: 2 %
HCT: 40.5 % (ref 39.0–52.0)
Hemoglobin: 13.9 g/dL (ref 13.0–17.0)
Immature Granulocytes: 0 %
Lymphocytes Relative: 26 %
Lymphs Abs: 1.7 10*3/uL (ref 0.7–4.0)
MCH: 31.5 pg (ref 26.0–34.0)
MCHC: 34.3 g/dL (ref 30.0–36.0)
MCV: 91.8 fL (ref 80.0–100.0)
Monocytes Absolute: 0.5 10*3/uL (ref 0.1–1.0)
Monocytes Relative: 8 %
Neutro Abs: 4.1 10*3/uL (ref 1.7–7.7)
Neutrophils Relative %: 63 %
Platelets: 182 10*3/uL (ref 150–400)
RBC: 4.41 MIL/uL (ref 4.22–5.81)
RDW: 12.6 % (ref 11.5–15.5)
WBC: 6.5 10*3/uL (ref 4.0–10.5)
nRBC: 0 % (ref 0.0–0.2)

## 2021-12-26 LAB — BASIC METABOLIC PANEL
Anion gap: 6 (ref 5–15)
BUN: 18 mg/dL (ref 8–23)
CO2: 26 mmol/L (ref 22–32)
Calcium: 9.5 mg/dL (ref 8.9–10.3)
Chloride: 105 mmol/L (ref 98–111)
Creatinine, Ser: 0.83 mg/dL (ref 0.61–1.24)
GFR, Estimated: >60 mL/min (ref >60)
Glucose, Bld: 97 mg/dL (ref 70–99)
Potassium: 4.6 mmol/L (ref 3.5–5.1)
Sodium: 137 mmol/L (ref 135–145)

## 2021-12-26 LAB — MAGNESIUM: Magnesium: 2.2 mg/dL (ref 1.7–2.4)

## 2021-12-26 SURGERY — PHACOEMULSIFICATION, CATARACT, WITH IOL INSERTION
Anesthesia: Monitor Anesthesia Care | Laterality: Left

## 2021-12-26 MED ORDER — TROPICAMIDE 1 % OP SOLN
1.0000 [drp] | OPHTHALMIC | Status: DC | PRN
Start: 2021-12-26 — End: 2021-12-26
  Administered 2021-12-26 (×2): 1 [drp] via OPHTHALMIC

## 2021-12-26 MED ORDER — PHENYLEPHRINE-KETOROLAC 1-0.3 % IO SOLN
INTRAOCULAR | Status: AC
  Filled 2021-12-26: qty 4

## 2021-12-26 MED ORDER — PROPOFOL 10 MG/ML IV BOLUS
0.5000 mg/kg | Freq: Once | INTRAVENOUS | Status: AC
  Administered 2021-12-26: 34.7 mg via INTRAVENOUS
  Filled 2021-12-26: qty 20

## 2021-12-26 MED ORDER — LIDOCAINE HCL 3.5 % OP GEL: 1.0000 | Freq: Once | OPHTHALMIC | Status: DC

## 2021-12-26 MED ORDER — EPINEPHRINE PF 1 MG/ML IJ SOLN
INTRAMUSCULAR | Status: AC
  Filled 2021-12-26: qty 1

## 2021-12-26 MED ORDER — PHENYLEPHRINE HCL 2.5 % OP SOLN
1.0000 [drp] | OPHTHALMIC | Status: DC | PRN
Start: 2021-12-26 — End: 2021-12-26
  Administered 2021-12-26 (×2): 1 [drp] via OPHTHALMIC

## 2021-12-26 MED ORDER — TETRACAINE HCL 0.5 % OP SOLN
1.0000 [drp] | OPHTHALMIC | Status: DC | PRN
  Administered 2021-12-26 (×2): 1 [drp] via OPHTHALMIC

## 2021-12-26 MED ORDER — SODIUM CHLORIDE 0.9 % IV BOLUS
500.0000 mL | Freq: Once | INTRAVENOUS | Status: AC
  Administered 2021-12-26: 500 mL via INTRAVENOUS

## 2021-12-26 NOTE — Sedation Documentation (Signed)
Synchronized cardioversion with 200J performed by Dr. Tomi Bamberger.

## 2021-12-26 NOTE — ED Provider Notes (Signed)
I provided a substantive portion of the care of this patient.  I personally performed the entirety of the exam for this encounter.    Pt seen with PA Faulker.  Case was discussed with cardiology.  Cardioversion recommend.  Pt would prefer to proceed with this option  .Sedation  Date/Time: 12/26/2021 4:02 PM  Performed by: Dorie Rank, MD Authorized by: Dorie Rank, MD   Consent:    Consent obtained:  Verbal   Consent given by:  Patient   Risks discussed:  Allergic reaction, dysrhythmia, inadequate sedation, nausea, prolonged hypoxia resulting in organ damage, prolonged sedation necessitating reversal, respiratory compromise necessitating ventilatory assistance and intubation and vomiting   Alternatives discussed:  Analgesia without sedation, anxiolysis and regional anesthesia Universal protocol:    Procedure explained and questions answered to patient or proxy's satisfaction: yes     Relevant documents present and verified: yes     Test results available: yes     Imaging studies available: yes     Required blood products, implants, devices, and special equipment available: yes     Site/side marked: yes     Immediately prior to procedure, a time out was called: yes     Patient identity confirmed:  Verbally with patient Indications:    Procedure necessitating sedation performed by:  Physician performing sedation Pre-sedation assessment:    Time since last food or drink:  12   ASA classification: class 1 - normal, healthy patient     Mouth opening:  3 or more finger widths   Thyromental distance:  3 finger widths   Mallampati score:  II - soft palate, uvula, fauces visible   Neck mobility: normal     Pre-sedation assessments completed and reviewed: airway patency, cardiovascular function, hydration status, mental status, nausea/vomiting, pain level, respiratory function and temperature     Pre-sedation assessment completed:  12/26/2021 3:42 PM Immediate pre-procedure details:     Reassessment: Patient reassessed immediately prior to procedure     Reviewed: vital signs, relevant labs/tests and NPO status     Verified: bag valve mask available, emergency equipment available, intubation equipment available, IV patency confirmed, oxygen available and suction available   Procedure details (see MAR for exact dosages):    Preoxygenation:  Nasal cannula   Sedation:  Propofol   Intended level of sedation: deep   Intra-procedure monitoring:  Blood pressure monitoring, cardiac monitor, continuous pulse oximetry, frequent LOC assessments, frequent vital sign checks and continuous capnometry   Intra-procedure events: none     Total Provider sedation time (minutes):  15 Post-procedure details:    Post-sedation assessment completed:  12/26/2021 4:02 PM   Attendance: Constant attendance by certified staff until patient recovered     Recovery: Patient returned to pre-procedure baseline     Post-sedation assessments completed and reviewed: airway patency, cardiovascular function, hydration status, mental status, nausea/vomiting, pain level, respiratory function and temperature     Patient is stable for discharge or admission: yes     Procedure completion:  Tolerated well, no immediate complications .Cardioversion  Date/Time: 12/26/2021 4:02 PM  Performed by: Dorie Rank, MD Authorized by: Dorie Rank, MD   Consent:    Consent obtained:  Written   Consent given by:  Patient   Risks discussed:  Induced arrhythmia and death   Alternatives discussed:  Rate-control medication Universal protocol:    Procedure explained and questions answered to patient or proxy's satisfaction: yes     Relevant documents present and verified: yes     Immediately  prior to procedure a time out was called: yes     Patient identity confirmed:  Verbally with patient Pre-procedure details:    Cardioversion basis:  Elective   Rhythm:  Atrial fibrillation Patient sedated: Yes. Refer to sedation procedure  documentation for details of sedation.  Attempt one:    Cardioversion mode:  Synchronous   Waveform:  Biphasic   Shock (Joules):  200   Shock outcome:  Conversion to normal sinus rhythm Post-procedure details:    Patient status:  Awake   Patient tolerance of procedure:  Tolerated well, no immediate complications     Dorie Rank, MD 12/26/21 [phone]

## 2021-12-26 NOTE — ED Provider Notes (Signed)
[facility] EMERGENCY DEPARTMENT Provider Note   CSN: [id number] Arrival date & time: 12/26/21  1231     History  Chief Complaint  Patient presents with   Irregular Heart Beat    Kyle [name]. is a 87 y.o. male.  HPI   Patient with medical history including hypertension, proximal A-fib currently on Eliquis, DVT, prostate cancer presents with complaints of irregular heartbeats.  Patient states that he was supposed to undergo cataract surgery today but the nurses during preop noted that his heart rate was fluctuating from 80s to 140 and was sent here for further evaluation.  He states that he has felt slightly fatigued over the last couple days, but does not denies dizziness, no worsening peripheral edema, states that he has been compliant with his Eliquis has not missed any dosages including today, he notes that he was cardioverted in the past and was back in sinus rhythm, he is not having any complaints at this time.  I reviewed patient's chart he is a patient of Dr. Harl Bowie, patient had a cardioversion last year and was placed on metoprolol, unfortunately he did not tolerate due to the low heart rate and this was discontinued, since that cardioversion he was in sinus rhythm, most recently he did have an episode of A-fib he underwent cardioversion on 08/10 is back in sinus rhythm, he is following up with Dr. Harl Bowie on the 29th for discussion of possible ablations.  Home Medications Prior to Admission medications   Medication Sig Start Date End Date Taking? Authorizing Provider  acetaminophen (TYLENOL) 325 MG tablet Take 2 tablets (650 mg total) by mouth every 6 (six) hours as needed for mild pain (or Fever >/= 101). 10/14/20  Yes Emokpae, Courage, MD  apixaban (ELIQUIS) 5 MG TABS tablet Take 1 tablet (5 mg total) by mouth 2 (two) times daily. 11/10/21  Yes BranchAlphonse Guild, MD  Cholecalciferol (VITAMIN D) 50 MCG (2000 UT) tablet Take 8,000 Units by mouth daily.   Yes [provider]  clobetasol cream (TEMOVATE) 2.45 % Apply 1 application topically 2 (two) times daily as needed (blisters).   Yes [provider]  fish oil-omega-3 fatty acids 1000 MG capsule Take 1 g by mouth 3 (three) times daily.   Yes [provider]  gabapentin (NEURONTIN) 600 MG tablet Take 1 tablet (600 mg total) by mouth daily. 04/23/21  Yes Suzzanne Cloud, NP  Garlic (GARLIQUE PO) Take 1 tablet by mouth daily.   Yes [provider]  HYDROcodone-acetaminophen (NORCO/VICODIN) 5-325 MG tablet Take 1 tablet by mouth 2 (two) times daily. 11/06/21  Yes [provider]  levETIRAcetam (KEPPRA) 500 MG tablet Take 1/2 tablet in the morning, take 1 tablet in the evening 04/23/21  Yes Suzzanne Cloud, NP  losartan (COZAAR) 25 MG tablet Take 1 tablet (25 mg total) by mouth in the morning and at bedtime. 01/07/21  Yes Imogene Burn, PA-C  magnesium oxide (MAG-OX) 400 (240 Mg) MG tablet Take 400 mg by mouth at bedtime.   Yes [provider]  niacinamide 500 MG tablet Take 500 mg by mouth 3 (three) times daily.   Yes [provider]  oxymetazoline (AFRIN) 0.05 % nasal spray Place 1 spray into both nostrils 2 (two) times daily as needed for congestion.   Yes [provider]  Polyethyl Glycol-Propyl Glycol 0.4-0.3 % SOLN Place 1 drop into both eyes 2 (two) times daily.   Yes [provider]  predniSONE (DELTASONE) 50  MG tablet For Cardiac CT: Prednisone 50 mg - take 13 hours prior to test Take another Prednisone 50 mg 7 hours prior to test Take another Prednisone 50 mg 1 hour prior to test Take Benadryl 50 mg 1 hour prior to test 12/16/21  Yes Vickie Epley, MD  pyridostigmine (MESTINON) 60 MG tablet TAKE 1 AND 1/2 TABLETS BY  MOUTH 4 TIMES DAILY Patient taking differently: Take 1.5 tablets by mouth 4 (four) times daily. 04/23/21  Yes Suzzanne Cloud, NP  tiZANidine (ZANAFLEX) 4 MG tablet Take 4 mg by mouth at bedtime. 07/19/20  Yes [provider]      Allergies    Iodinated contrast media, Sulfa antibiotics, and Bactericin [bacitracin]    Review of Systems   Review of Systems  Constitutional:  Negative for chills and fever.  Respiratory:  Negative for shortness of breath.   Cardiovascular:  Negative for chest pain.  Gastrointestinal:  Negative for abdominal pain.  Neurological:  Negative for headaches.    Physical Exam Updated Vital Signs BP 119/71   Pulse (!) 35   Temp 98.4 F (36.9 C) (Oral)   Resp 15   Ht '5\' 7"'$  (1.702 m)   Wt 69.4 kg   SpO2 100%   BMI 23.96 kg/m  Physical Exam Vitals and nursing note reviewed.  Constitutional:      General: He is not in acute distress.    Appearance: He is not ill-appearing.  HENT:     Head: Normocephalic and atraumatic.     Nose: No congestion.  Eyes:     Conjunctiva/sclera: Conjunctivae normal.     Comments: Left pupil is dilated in comparison to the right EOMs fully intact, pupils are reactive to light, and around.  Cardiovascular:     Rate and Rhythm: Tachycardia present. Rhythm irregular.     Pulses: Normal pulses.     Heart sounds: No murmur heard.    No friction rub. No gallop.  Pulmonary:     Effort: No respiratory distress.     Breath sounds: No wheezing, rhonchi or rales.  Abdominal:     Palpations: Abdomen is soft.     Tenderness: There is no abdominal tenderness. There is no right CVA tenderness or left CVA tenderness.  Musculoskeletal:     Right lower leg: No edema.     Left lower leg: No edema.  Skin:    General: Skin is warm and dry.  Neurological:     Mental Status: He is alert.  Psychiatric:        Mood and Affect: Mood normal.     ED Results / Procedures / Treatments   Labs (all labs ordered are listed, but only abnormal results are displayed) Labs Reviewed  BASIC METABOLIC PANEL  CBC WITH DIFFERENTIAL/PLATELET  MAGNESIUM    EKG EKG Interpretation  Date/Time:  Friday December 26 2021 15:57:05 EDT Ventricular Rate:   62 PR Interval:  187 QRS Duration: 103 QT Interval:  557 QTC Calculation: 482 R Axis:   65 Text Interpretation: Interpretation limited due to missing leads sinus arrhythmia has replaced atrial flutter Confirmed by Dorie Rank [phone]) on 12/26/2021 4:27:01 PM  Radiology No results found.  Procedures .Cardioversion  Date/Time: 12/26/2021 5:02 PM  Performed by: Marcello Fennel, PA-C Authorized by: Marcello Fennel, PA-C   Consent:    Consent obtained:  Verbal   Consent given by:  Patient   Risks discussed:  Cutaneous burn, death, induced arrhythmia and pain   Alternatives  discussed:  No treatment, anti-coagulation medication, delayed treatment, rate-control medication, alternative treatment, observation and referral Pre-procedure details:    Cardioversion basis:  Emergent   Rhythm:  Atrial fibrillation   Electrode placement:  Anterior-posterior Patient sedated: Yes. Refer to sedation procedure documentation for details of sedation.  Attempt one:    Cardioversion mode:  Synchronous   Waveform:  Monophasic   Shock (Joules):  200   Shock outcome:  Conversion to normal sinus rhythm Post-procedure details:    Patient status:  Awake   Patient tolerance of procedure:  Tolerated well, no immediate complications     Medications Ordered in ED Medications  sodium chloride 0.9 % bolus 500 mL (500 mLs Intravenous New Bag/Given 12/26/21 1502)  propofol (DIPRIVAN) 10 mg/mL bolus/IV push 34.7 mg (34.7 mg Intravenous Given 12/26/21 1549)    ED Course/ Medical Decision Making/ A&P                           Medical Decision Making Amount and/or Complexity of Data Reviewed Labs: ordered.   This patient presents to the ED for concern of irregular heartbeat, this involves an extensive number of treatment options, and is a complaint that carries with it a high risk of complications and morbidity.  The differential diagnosis includes arrhythmia, sepsis, electrolyte  derailments,    Additional history obtained:  Additional history obtained from family at bedside External records from outside source obtained and reviewed including notes   Co morbidities that complicate the patient evaluation  A-fib, currently on Eliquis  Social Determinants of Health:  N/A    Lab Tests:  I Ordered, and personally interpreted labs.  The pertinent results include: CBC unremarkable, BMP unremarkable, mag 2.2   Imaging Studies ordered:  I ordered imaging studies including N/A I independently visualized and interpreted imaging which showed N/A I agree with the radiologist interpretation   Cardiac Monitoring:  The patient was maintained on a cardiac monitor.  I personally viewed and interpreted the cardiac monitored which showed an underlying rhythm of: EKG shows A-fib   Medicines ordered and prescription drug management:  I ordered medication including fluids I have reviewed the patients home medicines and have made adjustments as needed  Critical Interventions:  N/A   Reevaluation:  Presents with irregular heartbeat, on my exam he was noted to be in A-fib, he is hemodynamically stable, no complaints at this time, will obtain basic lab work-up, provide him with gentle fluid resuscitation, consult with cardiology for further recommendations  Discussed patient options of cardioversion versus medical management, patient is that he would rather go with the cardiac conversion, I find this acceptable, reviewed his chart he received 200 J and converted back to sinus rhythm, he was given 40 mg of propofol.   Patient underwent cardioversion, and went from A-fib to sinus arrhythmia, he is not hemodynamically stable, has no complaints, I personally ambulated the patient and he had no complaints, no lightheaded or dizziness, heart rate has remained in the 60s, he is agreement plan discharge at this time.   Consultations Obtained:  I requested consultation  with the cardiologist Dr. Gasper Sells,  and discussed lab and imaging findings as well as pertinent plan - they recommend: If patient is symptomatic recommend cardio conversion, and discharged home with follow-up with cardiology, if not patient can be sent home on metoprolol 25 mg XL, he does not recommend placing patient on metoprolol if cardioversion is complete as he has known symptomatic bradycardia.  Test Considered:  N/A    Rule out Low suspicion for infectious etiology as patient is nontoxic-appearing, no leukocytosis, he was noted be tachycardic but this is likely secondary due to uncontrolled A-fib.  I have low suspicion for electrolyte derailments as lab work is unremarkable.  I have low suspicion for ACS denies any chest pain or shortness of breath, EKG without signs of ischemia.    Dispostion and problem list  After consideration of the diagnostic results and the patients response to treatment, I feel that the patent would benefit from discharge.  A-fib-likely patient has proximal A-fib, he underwent cardioversion with success, will defer on rhythm control at this time as he cannot tolerate this previously.  We will have him follow-up with cardiology for further evaluation and strict return precautions.            Final Clinical Impression(s) / ED Diagnoses Final diagnoses:  Paroxysmal atrial fibrillation [facility])    Rx / DC Orders ED Discharge Orders     None         Marcello Fennel, PA-C 12/26/21 1704    Dorie Rank, MD 01/05/22 [phone]

## 2021-12-26 NOTE — ED Notes (Signed)
Beeped to [phone].Chandrasekhar

## 2021-12-26 NOTE — Discharge Instructions (Addendum)
Please continue all home medications  I want you to follow-up with cardiology for further evaluation  Come back to the emergency department if you develop chest pain, shortness of breath, severe abdominal pain, uncontrolled nausea, vomiting, diarrhea.

## 2021-12-26 NOTE — Progress Notes (Signed)
Pt admitted to preop for cataract surgery. Upon being connected to monitor pt. Was found to be in afib at rate of 140.  Rate came down to 70s but then increased again to 140s.  Pt stated that he hasn't "felt quite right" this week.  Stated that he went to cardiology office earlier in the week but was found to be in a normal rate. Dr. Charna Elizabeth in to assess pt & decide to cancel procedure.  Dr. Leamon Arnt in agreement.  Pt. was taken to the ED. Report given to [place], Therapist, sports. Family at bedside

## 2021-12-26 NOTE — Progress Notes (Signed)
Patient came here for cataract extraction and IOL. H/O A fib, heart rate is 70 to 140/minute. Patient  not on any meication for A FIB. Procedure cancelled and will be rescheduled.

## 2021-12-26 NOTE — ED Triage Notes (Addendum)
Pt brought to ED from day surgery due to pt's HR fluctuating between 70 and 140bpm. Pt was here for cataract surgery on left eye today and day surgery nurse noticed pt's HR when he was placed on cardiac monitor (pt's left pupil is dilated due to the drops they gave him for surgery). Pt has a hx of A. Fib and was cardioverted 1 month ago and isn't on a beta blocker. Pt denies any CP or SOB.

## 2021-12-29 ENCOUNTER — Ambulatory Visit: Payer: Medicare Other | Admitting: Neurology

## 2021-12-29 ENCOUNTER — Encounter: Payer: Self-pay | Admitting: Neurology

## 2021-12-29 VITALS — BP 137/73 | HR 59 | Ht 67.0 in | Wt 154.5 lb

## 2021-12-29 DIAGNOSIS — M4126 Other idiopathic scoliosis, lumbar region: Secondary | ICD-10-CM | POA: Insufficient documentation

## 2021-12-29 DIAGNOSIS — M5416 Radiculopathy, lumbar region: Secondary | ICD-10-CM | POA: Insufficient documentation

## 2021-12-29 DIAGNOSIS — R269 Unspecified abnormalities of gait and mobility: Secondary | ICD-10-CM | POA: Diagnosis not present

## 2021-12-29 DIAGNOSIS — G7000 Myasthenia gravis without (acute) exacerbation: Secondary | ICD-10-CM

## 2021-12-29 DIAGNOSIS — M48061 Spinal stenosis, lumbar region without neurogenic claudication: Secondary | ICD-10-CM | POA: Insufficient documentation

## 2021-12-29 MED ORDER — LEVETIRACETAM 500 MG PO TABS: ORAL_TABLET | ORAL | 3 refills | Status: DC

## 2021-12-29 MED ORDER — GABAPENTIN 600 MG PO TABS: 600.0000 mg | ORAL_TABLET | Freq: Every day | ORAL | 3 refills | Status: DC

## 2021-12-29 MED ORDER — PYRIDOSTIGMINE BROMIDE 60 MG PO TABS: ORAL_TABLET | ORAL | 3 refills | Status: DC

## 2021-12-29 NOTE — Progress Notes (Signed)
Chief Complaint  Patient presents with   Follow-up    Rm 15. Alone. C/o leg weakness. Denies double vision.      ASSESSMENT AND PLAN  Kyle [name]. is a 87 y.o. male   History of left frontal craniotomy for benign left frontal tumor in 1993, with evidence of left frontal encephalomalacia Complex partial seizure  Doing well keep current Keppra 500 mg half tablet in the morning/1 at night,  Lumbar radiculopathy, chronic low back pain, gait abnormality  The pain management, not a surgical candidate, taking gabapentin 600 mg every night  History of ocular myasthenia gravis,  No significant extraocular muscle weakness detected at today's visit,  Suggesting going down on Mestinon may give maximum 3 tablets a day as needed   DIAGNOSTIC DATA (LABS, IMAGING, TESTING) - I reviewed patient records, labs, notes, testing and imaging myself where available.   MEDICAL HISTORY:  Kyle [name]., is a 87 year old male, follow-up on his neurological condition, ocular myasthenia gravis, complex partial seizure, history of left frontal craniotomy, left lumbar radiculopathy, chronic low back pain, his primary care physician is Dr. Redmond School, MD  I reviewed and summarized the referring note. PMHx. A fib DVT Left Craniotomy in 1993,  Partial seizure HTN Oculomyasthenia gravis  Patient had a history of left frontal craniotomy for benign brain tumor in 1993, had a complex partial seizure symptoms, doing well taking, and Keppra 500 mg half in the morning, 1 at night, has not had recurrent seizure for many years, described seizure preceded by feeling weird, then whole body shaking  Personally reviewed CT head without contrast 2014, MRI of the brain with without contrast February 2007,  left craniotomy with a cortical/subcortical resection of the posterior superior left frontal mass, with associated left frontal encephalomalacia  He had a significant arthritis disease, chronic low  back pain, radiating pain to bilateral lower extremity, frequent bilateral lower extremity muscle cramp, is under pain management Dr. Leda Gauze, personally reviewed MRI of lumbar in June 2023, multilevel degenerative changes, scoliosis, 34) 6, moderate spinal and right foraminal stenosis, left L4-5 foraminal impingement, L5-S1 left paracentral disc protrusion impinging on the left S1 nerve roots  He was seen by neurosurgeon, deemed not to be a surgical candidate, not taking gabapentin 600 mg every night, tizanidine as needed, failed multiple epidural injection recently, taking hydrocodone low-dose  He also carries a diagnosis of ocular myasthenia gravis, reported transient double vision, response to Mestinon, is on relative high dose 60 mg 1 and half tablets 4 times a day, denies significant side effect, but no longer have double vision, denies bulbar weakness, mild gait abnormality due to his chronic low back problem, multiple hands joint pain, deformity, mild grip weakness  History of atrial fibrillation, on chronic anticoagulation, no longer a candidate for NSAIDs, recent evaluation for possible watchman's procedure, was aborted due to reported history of severe allergic reaction to iodine,   PHYSICAL EXAM:   Vitals:   12/29/21 0841  BP: 137/73  Pulse: (!) 59  Weight: 154 lb 8 oz (70.1 kg)  Height: '5\' 7"'$  (1.702 m)   Not recorded     Body mass index is 24.2 kg/m.  PHYSICAL EXAMNIATION:  Gen: NAD, conversant, well nourised, well groomed                     Cardiovascular: Regular rate rhythm, no peripheral edema, warm, nontender. Eyes: Conjunctivae clear without exudates or hemorrhage Neck: Supple, no carotid bruits. Pulmonary: Clear  to auscultation bilaterally   NEUROLOGICAL EXAM:  MENTAL STATUS: Speech/cognition: Awake, alert, oriented to history taking and casual conversation CRANIAL NERVES: CN II: Visual fields are full to confrontation. Pupils are round equal and briskly  reactive to light. CN III, IV, VI: extraocular movement are normal. No ptosis.  Cover and uncover showed no significant extraocular muscle weakness CN V: Facial sensation is intact to light touch CN VII: Face is symmetric with normal eye closure, mild cheek puff weakness CN VIII: Hearing is normal to causal conversation. CN IX, X: Phonation is normal. CN XI: Head turning and shoulder shrug are intact  MOTOR: Deformity of multiple joints at bilateral hands, mild bilateral ankle dorsiflexion weakness  REFLEXES: Reflexes are 1 and symmetric at the biceps, triceps, knees, and ankles. Plantar responses are flexor.  SENSORY: Mild length-dependent decreased light touch, pinprick to distal shin level  COORDINATION: There is no trunk or limb dysmetria noted.  GAIT/STANCE: Need push up to get up from seated position, antalgic, mildly unsteady  REVIEW OF SYSTEMS:  Full 14 system review of systems performed and notable only for as above All other review of systems were negative.   ALLERGIES: Allergies  Allergen Reactions   Iodinated Contrast Media Hives     pt was premedicated/hives reaction occurred 24 hours post injection, Onset Date: 16109604    Sulfa Antibiotics Swelling and Rash   Bactericin [Bacitracin] Other (See Comments)    Unknown reaction     HOME MEDICATIONS: Current Outpatient Medications  Medication Sig Dispense Refill   acetaminophen (TYLENOL) 325 MG tablet Take 2 tablets (650 mg total) by mouth every 6 (six) hours as needed for mild pain (or Fever >/= 101). 12 tablet 4   apixaban (ELIQUIS) 5 MG TABS tablet Take 1 tablet (5 mg total) by mouth 2 (two) times daily. 60 tablet 5   Cholecalciferol (VITAMIN D) 50 MCG (2000 UT) tablet Take 8,000 Units by mouth daily.     clobetasol cream (TEMOVATE) 5.40 % Apply 1 application topically 2 (two) times daily as needed (blisters).     fish oil-omega-3 fatty acids 1000 MG capsule Take 1 g by mouth 3 (three) times daily.      gabapentin (NEURONTIN) 600 MG tablet Take 1 tablet (600 mg total) by mouth daily. 90 tablet 3   Garlic (GARLIQUE PO) Take 1 tablet by mouth daily.     HYDROcodone-acetaminophen (NORCO/VICODIN) 5-325 MG tablet Take 1 tablet by mouth 2 (two) times daily.     levETIRAcetam (KEPPRA) 500 MG tablet Take 1/2 tablet in the morning, take 1 tablet in the evening 135 tablet 3   losartan (COZAAR) 25 MG tablet Take 1 tablet (25 mg total) by mouth in the morning and at bedtime. 90 tablet 3   magnesium oxide (MAG-OX) 400 (240 Mg) MG tablet Take 400 mg by mouth at bedtime.     niacinamide 500 MG tablet Take 500 mg by mouth 3 (three) times daily.     oxymetazoline (AFRIN) 0.05 % nasal spray Place 1 spray into both nostrils 2 (two) times daily as needed for congestion.     Polyethyl Glycol-Propyl Glycol 0.4-0.3 % SOLN Place 1 drop into both eyes 2 (two) times daily.     predniSONE (DELTASONE) 50 MG tablet For Cardiac CT: Prednisone 50 mg - take 13 hours prior to test Take another Prednisone 50 mg 7 hours prior to test Take another Prednisone 50 mg 1 hour prior to test Take Benadryl 50 mg 1 hour prior to test 3  tablet 0   pyridostigmine (MESTINON) 60 MG tablet TAKE 1 AND 1/2 TABLETS BY  MOUTH 4 TIMES DAILY (Patient taking differently: Take 1.5 tablets by mouth 4 (four) times daily.) 540 tablet 3   tiZANidine (ZANAFLEX) 4 MG tablet Take 4 mg by mouth at bedtime.     No current facility-administered medications for this visit.    PAST MEDICAL HISTORY: Past Medical History:  Diagnosis Date   Arthritis    Atrial fibrillation (HCC)    Auto immune neutropenia (HCC)    Bullous pemphigoid    Cancer (HCC)    Prostate   Cervical spondylosis without myelopathy 75/64/3329   Complication of anesthesia    difficult to wake up after anesthesia   DVT (deep venous thrombosis) ([place])    a. Has had a previous right lower extremity DVT in the setting of hospitalization and surgery (after his brain tumor was resected in 1993)  but is no longer on Coumadin.   Dyslipidemia    Dysrhythmia    History of prostate cancer    Hypertension    Meningioma ([place])    a. s/p resection 1990s.   Myasthenia ([place]) 02/14/2013   Nocturnal leg cramps    Ocular myasthenia gravis ([place])    Peripheral neuropathy 11/23/2018   Seizures ([place])    Sinus bradycardia    Sleep paralysis    Thyroid nodule, cold     PAST SURGICAL HISTORY: Past Surgical History:  Procedure Laterality Date   BLADDER SURGERY     removed part of the bladder   BRAIN SURGERY     CARDIOVERSION N/A 11/07/2020   Procedure: CARDIOVERSION;  Surgeon: Arnoldo Lenis, MD;  Location: [facility];  Service: Endoscopy;  Laterality: N/A;   CARDIOVERSION N/A 11/20/2021   Procedure: CARDIOVERSION;  Surgeon: Arnoldo Lenis, MD;  Location: [facility];  Service: Endoscopy;  Laterality: N/A;   CHOLECYSTECTOMY     COLONOSCOPY N/A 07/25/2013   Procedure: COLONOSCOPY;  Surgeon: Jamesetta So, MD;  [facility];  Service: Gastroenterology;  Laterality: N/A;   COLONOSCOPY N/A 11/10/2016   Procedure: COLONOSCOPY;  Surgeon: Aviva Signs, MD;  [facility];  Service: Gastroenterology;  Laterality: N/A;   IR CATHETER TUBE CHANGE  01/02/2017   IR RADIOLOGIST EVAL & MGMT  01/06/2017   IR RADIOLOGIST EVAL & MGMT  01/20/2017   IR RADIOLOGIST EVAL & MGMT  02/03/2017   IR RADIOLOGIST EVAL & MGMT  02/17/2017   IR SINUS/FIST TUBE CHK-NON GI  03/03/2017   PARTIAL COLECTOMY     RADIOACTIVE SEED IMPLANT      FAMILY HISTORY: Family History  Problem Relation Age of Onset   Heart failure Mother    Diabetes Mother    Diabetes Sister    Dementia Sister    Lung cancer Son    Heart failure Daughter    Colon cancer Neg Hx     SOCIAL HISTORY: Social History   Socioeconomic History   Marital status: Married    Spouse name: Not on file   Number of children: 1   Years of education: 65 TH   Highest education level: Not on file  Occupational History   Occupation:  retired  Tobacco Use   Smoking status: Never    Passive exposure: Never   Smokeless tobacco: Never  Vaping Use   Vaping Use: Never used  Substance and Sexual Activity   Alcohol use: No   Drug use: No   Sexual activity: Not on file  Other  Topics Concern   Not on file  Social History Narrative   Patient is right handed.   Patient drinks 1 cup of coffee daily.   Social Determinants of Health   Financial Resource Strain: Low Risk  (12/18/2021)   Overall Financial Resource Strain (CARDIA)    Difficulty of Paying Living Expenses: Not hard at all  Food Insecurity: Not on file  Transportation Needs: No Transportation Needs (12/18/2021)   PRAPARE - Hydrologist (Medical): No    Lack of Transportation (Non-Medical): No  Physical Activity: Not on file  Stress: Not on file  Social Connections: Not on file  Intimate Partner Violence: Not on file      Marcial Pacas, M.D. Ph.D.  [facility] [address] [place], [place] [address] Ph: [phone] Fax: [phone]  CC:  Redmond School, MD [place],  [place] [address]  Redmond School, MD

## 2022-01-08 DIAGNOSIS — X32XXXD Exposure to sunlight, subsequent encounter: Secondary | ICD-10-CM | POA: Diagnosis not present

## 2022-01-08 DIAGNOSIS — L570 Actinic keratosis: Secondary | ICD-10-CM | POA: Diagnosis not present

## 2022-01-08 DIAGNOSIS — D225 Melanocytic nevi of trunk: Secondary | ICD-10-CM | POA: Diagnosis not present

## 2022-01-08 DIAGNOSIS — Z1283 Encounter for screening for malignant neoplasm of skin: Secondary | ICD-10-CM | POA: Diagnosis not present

## 2022-01-09 ENCOUNTER — Ambulatory Visit: Payer: Medicare Other | Attending: Cardiology | Admitting: Cardiology

## 2022-01-09 ENCOUNTER — Encounter: Payer: Self-pay | Admitting: Cardiology

## 2022-01-09 VITALS — BP 128/64 | HR 45 | Ht 68.0 in | Wt 152.6 lb

## 2022-01-09 DIAGNOSIS — I4892 Unspecified atrial flutter: Secondary | ICD-10-CM

## 2022-01-09 NOTE — Progress Notes (Signed)
Clinical Summary Mr. Dittus is a 87 y.o.male seen today for follow up of the following medical problems.  1.Atrial flutter - 11/07/20 DCCV. HRs low after conversion, diltiazem was stopped and continued on toprol '25mg'$ . Toprol lowered to 12.'5mg'$  daily after ongoing bradycardia and then later stopped   - EKG today shows SR, PAC, PVC - avg HR's high 50s.     -recurrent aflutter at 11/13/21 appt,  - 11/20/21 succesful DCCV to SR. Low HRs after conversion, toprol was stopped - 12/24/21 nursing visit EKG back in aflutter rate 93 -12/26/21 ER visit DCCV again to [facility]  - evaluated for watchman, not able to proceed due to severe contrast allergy  -Dr Quentin Ore had suggested if patient interested referring him to Dr. Leodis Liverpool who is starting this coming month with cardiothoracic surgery for a minimally invasive, thoracoscopic, off-pump left atrial appendage occlusion/clipping.  Patient does have interest, debilitating arthritis unable to take NSAIDs on eliquis.    Other medical problems not addressed this visit    2.History of chest pain Lexiscan normal 11/25/20 - no recent symptoms       3,. HTN - compliant with meds - avg bp 130s/70s -upcoming appt with Dr Gerarda Fraction   4. Aortic stenosis - 09/2020 echo LVEF 65-70%, AV mean grad 10 DI 0.44 AVA VTI 1.39 SVI 32    5. Hyperlipidemia - reports he was on simvastatin - mildly elevated LFTs, appears simva was stopped at that time.    Past Medical History:  Diagnosis Date   Arthritis    Atrial fibrillation (HCC)    Auto immune neutropenia (HCC)    Bullous pemphigoid    Cancer (HCC)    Prostate   Cervical spondylosis without myelopathy 22/29/7989   Complication of anesthesia    difficult to wake up after anesthesia   DVT (deep venous thrombosis) ([place])    a. Has had a previous right lower extremity DVT in the setting of hospitalization and surgery (after his brain tumor was resected in 1993) but is no longer on Coumadin.   Dyslipidemia     Dysrhythmia    History of prostate cancer    Hypertension    Meningioma ([place])    a. s/p resection 1990s.   Myasthenia ([place]) 02/14/2013   Nocturnal leg cramps    Ocular myasthenia gravis (HCC)    Peripheral neuropathy 11/23/2018   Seizures (HCC)    Sinus bradycardia    Sleep paralysis    Thyroid nodule, cold      Allergies  Allergen Reactions   Iodinated Contrast Media Hives     pt was premedicated/hives reaction occurred 24 hours post injection, Onset Date: 21194174    Sulfa Antibiotics Swelling and Rash   Bactericin [Bacitracin] Other (See Comments)    Unknown reaction      Current Outpatient Medications  Medication Sig Dispense Refill   acetaminophen (TYLENOL) 325 MG tablet Take 2 tablets (650 mg total) by mouth every 6 (six) hours as needed for mild pain (or Fever >/= 101). 12 tablet 4   apixaban (ELIQUIS) 5 MG TABS tablet Take 1 tablet (5 mg total) by mouth 2 (two) times daily. 60 tablet 5   Cholecalciferol (VITAMIN D) 50 MCG (2000 UT) tablet Take 8,000 Units by mouth daily.     clobetasol cream (TEMOVATE) 0.81 % Apply 1 application topically 2 (two) times daily as needed (blisters).     fish oil-omega-3 fatty acids 1000 MG capsule Take 1 g by mouth 3 (three) times  daily.     gabapentin (NEURONTIN) 600 MG tablet Take 1 tablet (600 mg total) by mouth daily. 90 tablet 3   Garlic (GARLIQUE PO) Take 1 tablet by mouth daily.     HYDROcodone-acetaminophen (NORCO/VICODIN) 5-325 MG tablet Take 1 tablet by mouth 2 (two) times daily.     levETIRAcetam (KEPPRA) 500 MG tablet Take 1/2 tablet in the morning, take 1 tablet in the evening 135 tablet 3   losartan (COZAAR) 25 MG tablet Take 1 tablet (25 mg total) by mouth in the morning and at bedtime. 90 tablet 3   magnesium oxide (MAG-OX) 400 (240 Mg) MG tablet Take 400 mg by mouth at bedtime.     niacinamide 500 MG tablet Take 500 mg by mouth 3 (three) times daily.     oxymetazoline (AFRIN) 0.05 % nasal spray Place 1 spray into both  nostrils 2 (two) times daily as needed for congestion.     Polyethyl Glycol-Propyl Glycol 0.4-0.3 % SOLN Place 1 drop into both eyes 2 (two) times daily.     predniSONE (DELTASONE) 50 MG tablet For Cardiac CT: Prednisone 50 mg - take 13 hours prior to test Take another Prednisone 50 mg 7 hours prior to test Take another Prednisone 50 mg 1 hour prior to test Take Benadryl 50 mg 1 hour prior to test 3 tablet 0   pyridostigmine (MESTINON) 60 MG tablet TAKE 1 AND 1/2 TABLETS BY  MOUTH 4 TIMES DAILY 540 tablet 3   tiZANidine (ZANAFLEX) 4 MG tablet Take 4 mg by mouth at bedtime.     No current facility-administered medications for this visit.     Past Surgical History:  Procedure Laterality Date   BLADDER SURGERY     removed part of the bladder   BRAIN SURGERY     CARDIOVERSION N/A 11/07/2020   Procedure: CARDIOVERSION;  Surgeon: Arnoldo Lenis, MD;  Location[facility];  Service: Endoscopy;  Laterality: N/A;   CARDIOVERSION N/A 11/20/2021   Procedure: CARDIOVERSION;  Surgeon: Arnoldo Lenis, MD;  Location: [facility];  Service: Endoscopy;  Laterality: N/A;   CHOLECYSTECTOMY     COLONOSCOPY N/A 07/25/2013   Procedure: COLONOSCOPY;  Surgeon: Jamesetta So, MD;  [facility];  Service: Gastroenterology;  Laterality: N/A;   COLONOSCOPY N/A 11/10/2016   Procedure: COLONOSCOPY;  Surgeon: Aviva Signs, MD;  [facility];  Service: Gastroenterology;  Laterality: N/A;   IR CATHETER TUBE CHANGE  01/02/2017   IR RADIOLOGIST EVAL & MGMT  01/06/2017   IR RADIOLOGIST EVAL & MGMT  01/20/2017   IR RADIOLOGIST EVAL & MGMT  02/03/2017   IR RADIOLOGIST EVAL & MGMT  02/17/2017   IR SINUS/FIST TUBE CHK-NON GI  03/03/2017   PARTIAL COLECTOMY     RADIOACTIVE SEED IMPLANT       Allergies  Allergen Reactions   Iodinated Contrast Media Hives     pt was premedicated/hives reaction occurred 24 hours post injection, Onset Date: 36144315    Sulfa Antibiotics Swelling and Rash    Bactericin [Bacitracin] Other (See Comments)    Unknown reaction       Family History  Problem Relation Age of Onset   Heart failure Mother    Diabetes Mother    Diabetes Sister    Dementia Sister    Lung cancer Son    Heart failure Daughter    Colon cancer Neg Hx      Social History Mr. Sitter reports that he has never smoked. He  has never been exposed to tobacco smoke. He has never used smokeless tobacco. Mr. Givler reports no history of alcohol use.   Review of Systems CONSTITUTIONAL: No weight loss, fever, chills, weakness or fatigue.  HEENT: Eyes: No visual loss, blurred vision, double vision or yellow sclerae.No hearing loss, sneezing, congestion, runny nose or sore throat.  SKIN: No rash or itching.  CARDIOVASCULAR: per hpi RESPIRATORY: No shortness of breath, cough or sputum.  GASTROINTESTINAL: No anorexia, nausea, vomiting or diarrhea. No abdominal pain or blood.  GENITOURINARY: No burning on urination, no polyuria NEUROLOGICAL: No headache, dizziness, syncope, paralysis, ataxia, numbness or tingling in the extremities. No change in bowel or bladder control.  MUSCULOSKELETAL: No muscle, back pain, joint pain or stiffness.  LYMPHATICS: No enlarged nodes. No history of splenectomy.  PSYCHIATRIC: No history of depression or anxiety.  ENDOCRINOLOGIC: No reports of sweating, cold or heat intolerance. No polyuria or polydipsia.  Marland Kitchen   Physical Examination Today's Vitals   01/09/22 0827  BP: 128/64  Pulse: (!) 45  SpO2: 100%  Weight: 152 lb 9.6 oz (69.2 kg)  Height: '5\' 8"'$  (1.727 m)   Body mass index is 23.2 kg/m.  Gen: resting comfortably, no acute distress HEENT: no scleral icterus, pupils equal round and reactive, no palptable cervical adenopathy,  CV: regular, brady, no m/r/g no jvd Resp: Clear to auscultation bilaterally GI: abdomen is soft, non-tender, non-distended, normal bowel sounds, no hepatosplenomegaly MSK: extremities are warm, no edema.  Skin:  warm, no rash Neuro:  no focal deficits Psych: appropriate affect      Assessment and Plan   1.Aflutter/acquired thrombophilia - recurrences of atypical aflutter, most recent DCCV 2 weeks ago - EKG today sinus brady 40s - has not been able to be on av nodal agents due to low HRs - monitor at this time, if recurrence likely would start amiodarone. He has severe dye allergy which prohibited him from watchman consideration, will touch base with EP if this would likely prohibit considerations for ablation. Sees Dr Lovena Le in Nov  -wants to get off DOACs, has debilitating arthiritis and wants to get back on NSADIs. Not a candidate for watchman given severe dye allergy. Dr Quentin Ore had suggested referal to Dr Leodis Liverpool for  to consider a minimally invasive, thoracoscopic, off-pump left atrial appendage occlusion/clipping if patient has interest, patient is interested and we will make referal.     Arnoldo Lenis, M.D.

## 2022-01-09 NOTE — Patient Instructions (Signed)
Medication Instructions:  Continue all current medications.  Labwork: none  Testing/Procedures: none  Follow-Up: 4 months   Any Other Special Instructions Will Be Listed Below (If Applicable).  If you need a refill on your cardiac medications before your next appointment, please call your pharmacy.\ 

## 2022-01-14 ENCOUNTER — Other Ambulatory Visit: Payer: Self-pay | Admitting: *Deleted

## 2022-01-16 ENCOUNTER — Ambulatory Visit: Payer: Self-pay | Admitting: *Deleted

## 2022-01-16 NOTE — Patient Outreach (Signed)
  Care Coordination   01/16/2022 Name: Kyle [name]. MRN: [id number] DOB: 04/05/1935   Care Coordination Outreach Attempts:  An unsuccessful telephone outreach was attempted for a scheduled appointment today.  Follow Up Plan:  Additional outreach attempts will be made to offer the patient care coordination information and services.   Encounter Outcome:  No Answer  Care Coordination Interventions Activated:  No   Care Coordination Interventions:  No, not indicated    Valente David, RN, MSN, [facility] Care Management Care Management Coordinator [phone]

## 2022-01-20 ENCOUNTER — Other Ambulatory Visit: Payer: Self-pay | Admitting: *Deleted

## 2022-01-20 DIAGNOSIS — I4891 Unspecified atrial fibrillation: Secondary | ICD-10-CM

## 2022-01-30 ENCOUNTER — Telehealth: Payer: Self-pay | Admitting: *Deleted

## 2022-01-30 NOTE — Chronic Care Management (AMB) (Signed)
  Care Coordination Note  01/30/2022 Name: Kyle [name]. MRN: [id number] DOB: 1935/01/28  Kyle [name]. is a 87 y.o. year old male who is a primary care patient of Redmond School, MD and is actively engaged with the care management team. I reached out to [employer]. by phone today to assist with re-scheduling a follow up visit with the RN Case Manager  Follow up plan: Telephone appointment with care management team member scheduled for:02/03/22  [place]  Direct Dial: [phone]

## 2022-02-02 ENCOUNTER — Other Ambulatory Visit (HOSPITAL_COMMUNITY): Payer: Self-pay | Admitting: Internal Medicine

## 2022-02-02 DIAGNOSIS — I4891 Unspecified atrial fibrillation: Secondary | ICD-10-CM | POA: Diagnosis not present

## 2022-02-02 DIAGNOSIS — I10 Essential (primary) hypertension: Secondary | ICD-10-CM | POA: Diagnosis not present

## 2022-02-02 DIAGNOSIS — E063 Autoimmune thyroiditis: Secondary | ICD-10-CM | POA: Diagnosis not present

## 2022-02-02 DIAGNOSIS — I739 Peripheral vascular disease, unspecified: Secondary | ICD-10-CM

## 2022-02-02 DIAGNOSIS — Z6822 Body mass index (BMI) 22.0-22.9, adult: Secondary | ICD-10-CM | POA: Diagnosis not present

## 2022-02-02 DIAGNOSIS — G7000 Myasthenia gravis without (acute) exacerbation: Secondary | ICD-10-CM | POA: Diagnosis not present

## 2022-02-02 DIAGNOSIS — I129 Hypertensive chronic kidney disease with stage 1 through stage 4 chronic kidney disease, or unspecified chronic kidney disease: Secondary | ICD-10-CM | POA: Diagnosis not present

## 2022-02-03 ENCOUNTER — Ambulatory Visit: Payer: Self-pay | Admitting: *Deleted

## 2022-02-03 NOTE — Patient Outreach (Signed)
  Care Coordination   Follow Up Visit Note   02/03/2022 Name: Kyle [name]. MRN: [id number] DOB: Sep 07, 1934  Kyle [name]. is a 87 y.o. year old male who sees Redmond School, MD for primary care. I spoke with  Kyle [name]. by phone today.  What matters to the patients health and wellness today?  Continues to have issues with intermittent A-fib.  Seen in ED on 9/15, followed up with cardiology on 9/29.    Goals Addressed             This Visit's Progress    Care Coordination Services   On track    Care Coordination Interventions: Counseled on increased risk of stroke due to Afib and benefits of anticoagulation for stroke prevention Reviewed importance of adherence to anticoagulant exactly as prescribed Screening for signs and symptoms of depression related to chronic disease state  Reviewed plan for potential interventions for A-fib based on allergy to dye Reviewed upcoming appointments (10/26 with Dr. Tenny Craw, 11/2 with Dr. Lovena Le, and 1/30 with Dr. Harl Bowie) Provided with [facility] contact information Discussed outcome of last PCP visit yesterday, he is waiting for call for ultrasound of legs due to pain and possible decreased circulation Assessed need for transportation assistance (state daughter will accompany him)         SDOH assessments and interventions completed:  No     Care Coordination Interventions Activated:  Yes  Care Coordination Interventions:  Yes, provided   Follow up plan: Follow up call scheduled for 11/7    Encounter Outcome:  Pt. Visit Completed   Valente David, RN, MSN, [place] Care Management Care Management Coordinator [phone]

## 2022-02-03 NOTE — Patient Instructions (Signed)
Visit Information  Thank you for taking time to visit with me today. Please don't hesitate to contact me if I can be of assistance to you before our next scheduled telephone appointment.  Following are the goals we discussed today:  Keep and attend cardiology appointments. Follow up with PCP if no call to schedule ultrasound  Our next appointment is by telephone on 11/7  Please call the care guide team at [phone] if you need to cancel or reschedule your appointment.   Please call the Suicide and Crisis Lifeline: 988 call the [place] National Suicide Prevention Lifeline: 959-757-5150 or TTY: 615-018-0217 TTY 412-355-9163) to talk to a trained counselor call 1-800-273-TALK (toll free, 24 hour hotline) call the [facility]: ([phone] [phone] call 911 if you are experiencing a Mental Health or Circle or need someone to talk to.  The patient verbalized understanding of instructions, educational materials, and care plan provided today and agreed to receive a mailed copy of patient instructions, educational materials, and care plan.   The patient has been provided with contact information for the care management team and has been advised to call with any health related questions or concerns.   Valente David, RN, MSN, [place] Care Management Care Management Coordinator [phone]

## 2022-02-04 NOTE — ED Provider Notes (Signed)
Patient seen on 09/15 was in A-fib and was cardioverted, unable to add critical care to previous note  .Critical Care  Performed by: Marcello Fennel, PA-C Authorized by: Marcello Fennel, PA-C   Critical care provider statement:    Critical care time (minutes):  30   Critical care time was exclusive of:  Separately billable procedures and treating other patients   Critical care was necessary to treat or prevent imminent or life-threatening deterioration of the following conditions:  Cardiac failure   Critical care was time spent personally by me on the following activities:  Development of treatment plan with patient or surrogate, discussions with consultants, evaluation of patient's response to treatment, examination of patient, ordering and review of laboratory studies, ordering and review of radiographic studies, ordering and performing treatments and interventions, pulse oximetry, re-evaluation of patient's condition and review of old charts   I assumed direction of critical care for this patient from another provider in my specialty: no       Marcello Fennel, PA-C 02/04/22 0370    Dorie Rank, MD 02/05/22 1436

## 2022-02-05 ENCOUNTER — Institutional Professional Consult (permissible substitution): Payer: Medicare Other | Admitting: Cardiothoracic Surgery

## 2022-02-05 VITALS — BP 160/71 | HR 58 | Resp 20 | Ht 67.0 in | Wt 150.0 lb

## 2022-02-05 DIAGNOSIS — I4892 Unspecified atrial flutter: Secondary | ICD-10-CM

## 2022-02-06 ENCOUNTER — Telehealth: Payer: Self-pay | Admitting: Cardiology

## 2022-02-06 ENCOUNTER — Ambulatory Visit (HOSPITAL_COMMUNITY)
Admission: RE | Admit: 2022-02-06 | Discharge: 2022-02-06 | Disposition: A | Discharge status: Home / Self Care | Payer: Medicare Other | Source: Ambulatory Visit | Attending: Internal Medicine | Admitting: Internal Medicine

## 2022-02-06 DIAGNOSIS — I70213 Atherosclerosis of native arteries of extremities with intermittent claudication, bilateral legs: Secondary | ICD-10-CM | POA: Diagnosis not present

## 2022-02-06 DIAGNOSIS — I739 Peripheral vascular disease, unspecified: Secondary | ICD-10-CM | POA: Diagnosis not present

## 2022-02-06 NOTE — Telephone Encounter (Signed)
Pt came into the office this morning and stated that Dr. Harl Bowie had set him up to have an appt w/Dr Lovena Le and Dr. Tenny Craw. Pt saw Dr. Tenny Craw yesterday and was told he is a candidate for a procedure that Dr. Tenny Craw is going to try to get done early December or January. The Pt wants to know if he should keep his Nov 3rd appt with Dr. Lovena Le.

## 2022-02-06 NOTE — Telephone Encounter (Signed)
Dr.Taylor, does patient need to see you in November? He was seen by Dr.Enter at [facility] and has plans for procedure in December.

## 2022-02-07 NOTE — Telephone Encounter (Signed)
No. Followup with me in January. GT

## 2022-02-08 NOTE — Progress Notes (Signed)
[address]       [place] [address]             [phone]        [name]. [place] Record #[id number] Date of Birth: Sep 19, 1934  Referring: Arnoldo Lenis, MD Primary Care: Redmond School, MD Primary Cardiologist:Branch, Roderic Palau, MD  Chief Complaint:    Chief Complaint  Patient presents with   Atrial Fibrillation    Surgical consult, ECHO 10/15/21/ Cardioversion 12/26/21    History of Present Illness:      Kyle [name]. is a 87 y.o. male who presents for an evaluation of AF at the request of Dr Harl Bowie and Dr. Quentin Ore.  Medical history includes history includes AF, prostate CA, provoked DVT, HTN, moderate AS and debilitating arthritis on chronic NSAIDS.    He was looked at for [facility] given arthritis requires a lot of NSAIDS. Not a candidate due to severe contrast allergy.   He was cardioverted back in August 2023 for recurrent Aflutter.       Past Medical History:  Diagnosis Date   Arthritis    Atrial fibrillation (HCC)    Auto immune neutropenia (HCC)    Bullous pemphigoid    Cancer (HCC)    Prostate   Cervical spondylosis without myelopathy 89/38/1017   Complication of anesthesia    difficult to wake up after anesthesia   DVT (deep venous thrombosis) ([place])    a. Has had a previous right lower extremity DVT in the setting of hospitalization and surgery (after his brain tumor was resected in 1993) but is no longer on Coumadin.   Dyslipidemia    Dysrhythmia    History of prostate cancer    Hypertension    Meningioma ([place])    a. s/p resection 1990s.   Myasthenia ([place]) 02/14/2013   Nocturnal leg cramps    Ocular myasthenia gravis ([place])    Peripheral neuropathy 11/23/2018   Seizures ([place])    Sinus bradycardia    Sleep paralysis    Thyroid nodule, cold     Past Surgical History:  Procedure Laterality Date   BLADDER SURGERY     removed part of the bladder   BRAIN SURGERY     CARDIOVERSION N/A  11/07/2020   Procedure: CARDIOVERSION;  Surgeon: Arnoldo Lenis, MD;  Location: [facility];  Service: Endoscopy;  Laterality: N/A;   CARDIOVERSION N/A 11/20/2021   Procedure: CARDIOVERSION;  Surgeon: Arnoldo Lenis, MD;  Location: [facility];  Service: Endoscopy;  Laterality: N/A;   CHOLECYSTECTOMY     COLONOSCOPY N/A 07/25/2013   Procedure: COLONOSCOPY;  Surgeon: Jamesetta So, MD;  Location: [facility];  Service: Gastroenterology;  Laterality: N/A;   COLONOSCOPY N/A 11/10/2016   Procedure: COLONOSCOPY;  Surgeon: Aviva Signs, MD;  Location: [facility];  Service: Gastroenterology;  Laterality: N/A;   IR CATHETER TUBE CHANGE  01/02/2017   IR RADIOLOGIST EVAL & MGMT  01/06/2017   IR RADIOLOGIST EVAL & MGMT  01/20/2017   IR RADIOLOGIST EVAL & MGMT  02/03/2017   IR RADIOLOGIST EVAL & MGMT  02/17/2017   IR SINUS/FIST TUBE CHK-NON GI  03/03/2017   PARTIAL COLECTOMY     RADIOACTIVE SEED IMPLANT      Social History   Tobacco Use  Smoking Status Never   Passive exposure: Never  Smokeless Tobacco Never    Social History   Substance and Sexual Activity  Alcohol Use No  Allergies  Allergen Reactions   Iodinated Contrast Media Hives     pt was premedicated/hives reaction occurred 24 hours post injection, Onset Date: [id number]    Sulfa Antibiotics Swelling and Rash   Bactericin [Bacitracin] Other (See Comments)    Unknown reaction     Current Outpatient Medications  Medication Sig Dispense Refill   apixaban (ELIQUIS) 5 MG TABS tablet Take 1 tablet (5 mg total) by mouth 2 (two) times daily. 60 tablet 5   Cholecalciferol (VITAMIN D) 50 MCG (2000 UT) tablet Take 8,000 Units by mouth daily.     clobetasol cream (TEMOVATE) 1.09 % Apply 1 application topically 2 (two) times daily as needed (blisters).     fish oil-omega-3 fatty acids 1000 MG capsule Take 1 g by mouth 3 (three) times daily.     gabapentin (NEURONTIN) 600 MG tablet Take 1 tablet (600 mg total) by mouth daily. 90  tablet 3   Garlic (GARLIQUE PO) Take 1 tablet by mouth daily.     HYDROcodone-acetaminophen (NORCO/VICODIN) 5-325 MG tablet Take 1 tablet by mouth 2 (two) times daily.     levETIRAcetam (KEPPRA) 500 MG tablet Take 1/2 tablet in the morning, take 1 tablet in the evening 135 tablet 3   losartan (COZAAR) 25 MG tablet Take 1 tablet (25 mg total) by mouth in the morning and at bedtime. 90 tablet 3   magnesium oxide (MAG-OX) 400 (240 Mg) MG tablet Take 400 mg by mouth at bedtime.     niacinamide 500 MG tablet Take 500 mg by mouth 3 (three) times daily.     oxymetazoline (AFRIN) 0.05 % nasal spray Place 1 spray into both nostrils 2 (two) times daily as needed for congestion.     Polyethyl Glycol-Propyl Glycol 0.4-0.3 % SOLN Place 1 drop into both eyes 2 (two) times daily.     predniSONE (DELTASONE) 50 MG tablet For Cardiac CT: Prednisone 50 mg - take 13 hours prior to test Take another Prednisone 50 mg 7 hours prior to test Take another Prednisone 50 mg 1 hour prior to test Take Benadryl 50 mg 1 hour prior to test 3 tablet 0   pyridostigmine (MESTINON) 60 MG tablet Take 90 mg by mouth 3 (three) times daily.     simvastatin (ZOCOR) 20 MG tablet Take 20 mg by mouth at bedtime.     tiZANidine (ZANAFLEX) 4 MG tablet Take 4 mg by mouth at bedtime.     No current facility-administered medications for this visit.    (Not in a hospital admission)   Family History  Problem Relation Age of Onset   Heart failure Mother    Diabetes Mother    Diabetes Sister    Dementia Sister    Lung cancer Son    Heart failure Daughter    Colon cancer Neg Hx      Review of Systems:   ROS Neg 14 pt except as per hpi    Physical Exam: BP (!) 160/71   Pulse (!) 58   Resp 20   Ht '5\' 7"'$  (1.702 m)   Wt 150 lb (68 kg)   SpO2 99% Comment: RA  BMI 23.49 kg/m   NAD  Appears stated age Clear arthritis in hands Resp nonlaboured Extr wwp  Diagnostic Studies & Laboratory data:     Recent Radiology Findings:    No results found.   I have independently reviewed the above radiologic studies and discussed with the patient   Recent Lab Findings: Lab Results  Component Value Date   WBC 6.5 12/26/2021   HGB 13.9 12/26/2021   HCT 40.5 12/26/2021   PLT 182 12/26/2021   GLUCOSE 97 12/26/2021   CHOL 182 10/26/2014   TRIG 130 10/26/2014   HDL 67 10/26/2014   LDLCALC 89 10/26/2014   ALT 17 11/13/2021   AST 20 11/13/2021   NA 137 12/26/2021   K 4.6 12/26/2021   CL 105 12/26/2021   CREATININE 0.83 12/26/2021   BUN 18 12/26/2021   CO2 26 12/26/2021   TSH 1.970 05/05/2021   INR 1.0 10/10/2020   HGBA1C 5.9 (H) 10/25/2014   12/05/20 Nuclear Spect       The study is normal. The study is low risk. There are no perfusion defects consistent with prior infarct or current ischemia.   1.0 mm of horizontal ST depression in the inferior leads (II, III and aVF) was noted.   Defect 1: There is a medium defect with moderate reduction in uptake present in the apical to basal inferior location(s). The defect is most intense in the resting images most consistent with subdiaphragmatic attenuation.   Left ventricular function is normal.     Assessment / Plan:    Cedrik Heindl. is a 87 y.o. male who presents for an evaluation of LAA clip.  Severe allergy to contrast and thus not a Watchman candidate.   Plan for mini-invasive LAA clip.   Formally, our mini-invasive AF program will start Apr 13, 2022.  We'll try to get him taken care of before than as its the LAA alone. Likely Mid - November.   Justice Rocher MD CV Surgery

## 2022-02-12 ENCOUNTER — Ambulatory Visit: Payer: Medicare Other | Admitting: Internal Medicine

## 2022-02-17 ENCOUNTER — Encounter: Payer: Self-pay | Admitting: *Deleted

## 2022-02-17 ENCOUNTER — Ambulatory Visit: Payer: Self-pay | Admitting: *Deleted

## 2022-02-17 NOTE — Patient Outreach (Signed)
  Care Coordination   Follow Up Visit Note   02/17/2022 Name: Kyle [name]. MRN: [id number] DOB: [birth year]  Kyle [name]. is a 87 y.o. year old male who sees Redmond School, MD for primary care. I spoke with  Kyle [name]. by phone today.  What matters to the patients health and wellness today?  Management of Afib and chronic back pain    Goals Addressed             This Visit's Progress    Care Coordination Services       Care Coordination Interventions: Counseled on increased risk of stroke due to Afib and benefits of anticoagulation for stroke prevention Reviewed importance of adherence to anticoagulant exactly as prescribed Screening for signs and symptoms of depression related to chronic disease state  Reviewed and discussed recent visit with Dr Tenny Craw (cardiothoracic surgeon) regarding TAA procedure for Afib management. Patient is not a candidate for the Watchmen device because of his allergy to contrast dye.  Discussed that procedure is planned for Dec or Jan but hasn't been scheduled yet Reviewed medications and discussed use of Eliquis until after procedure. May have to remain on it for a short time afterwards. Patient would like to be able to resume taking NSAIDs for back pain but knows to hold off until Eliquis has been d/c.  Discussed use of tylenol for pain and other management techniques Provided with [facility] telephone number and encouraged to reach out as needed        SDOH assessments and interventions completed:  No     Care Coordination Interventions Activated:  Yes  Care Coordination Interventions:  Yes, provided   Follow up plan: Follow up call scheduled for 03/25/22    Encounter Outcome:  Pt. Visit Completed   Chong Sicilian, BSN, RN-BC RN Care Coordinator [place]: [phone] Main #: [phone]

## 2022-03-01 ENCOUNTER — Emergency Department (HOSPITAL_COMMUNITY)
Admission: EM | Admit: 2022-03-01 | Discharge: 2022-03-01 | Disposition: A | Discharge status: Home / Self Care | Payer: Medicare Other | Attending: Emergency Medicine | Admitting: Emergency Medicine

## 2022-03-01 ENCOUNTER — Encounter (HOSPITAL_COMMUNITY): Payer: Self-pay

## 2022-03-01 ENCOUNTER — Other Ambulatory Visit: Payer: Self-pay

## 2022-03-01 ENCOUNTER — Emergency Department (HOSPITAL_COMMUNITY): Payer: Medicare Other

## 2022-03-01 DIAGNOSIS — I4891 Unspecified atrial fibrillation: Secondary | ICD-10-CM | POA: Diagnosis not present

## 2022-03-01 DIAGNOSIS — Z7901 Long term (current) use of anticoagulants: Secondary | ICD-10-CM | POA: Insufficient documentation

## 2022-03-01 DIAGNOSIS — R000 Tachycardia, unspecified: Secondary | ICD-10-CM | POA: Diagnosis not present

## 2022-03-01 DIAGNOSIS — Z79899 Other long term (current) drug therapy: Secondary | ICD-10-CM | POA: Insufficient documentation

## 2022-03-01 LAB — BASIC METABOLIC PANEL
Anion gap: 7 (ref 5–15)
BUN: 23 mg/dL (ref 8–23)
CO2: 24 mmol/L (ref 22–32)
Calcium: 9.2 mg/dL (ref 8.9–10.3)
Chloride: 105 mmol/L (ref 98–111)
Creatinine, Ser: 0.83 mg/dL (ref 0.61–1.24)
GFR, Estimated: >60 mL/min (ref >60)
Glucose, Bld: 105 mg/dL — ABNORMAL HIGH (ref 70–99)
Potassium: 4.2 mmol/L (ref 3.5–5.1)
Sodium: 136 mmol/L (ref 135–145)

## 2022-03-01 LAB — CBC
HCT: 42.5 % (ref 39.0–52.0)
Hemoglobin: 14.3 g/dL (ref 13.0–17.0)
MCH: 31.4 pg (ref 26.0–34.0)
MCHC: 33.6 g/dL (ref 30.0–36.0)
MCV: 93.2 fL (ref 80.0–100.0)
Platelets: 183 10*3/uL (ref 150–400)
RBC: 4.56 MIL/uL (ref 4.22–5.81)
RDW: 12.8 % (ref 11.5–15.5)
WBC: 7.6 10*3/uL (ref 4.0–10.5)
nRBC: 0 % (ref 0.0–0.2)

## 2022-03-01 MED ORDER — SODIUM CHLORIDE 0.9 % IV SOLN: INTRAVENOUS | Status: DC

## 2022-03-01 MED ORDER — ONDANSETRON HCL 4 MG/2ML IJ SOLN
4.0000 mg | Freq: Once | INTRAMUSCULAR | Status: AC
  Administered 2022-03-01: 4 mg via INTRAVENOUS
  Filled 2022-03-01: qty 2

## 2022-03-01 MED ORDER — ETOMIDATE 2 MG/ML IV SOLN
INTRAVENOUS | Status: AC | PRN
  Administered 2022-03-01: 5 mg via INTRAVENOUS

## 2022-03-01 MED ORDER — ETOMIDATE 2 MG/ML IV SOLN
10.0000 mg | Freq: Once | INTRAVENOUS | Status: AC
  Administered 2022-03-01: 10 mg via INTRAVENOUS
  Filled 2022-03-01: qty 10

## 2022-03-01 NOTE — ED Provider Notes (Addendum)
[facility] EMERGENCY DEPARTMENT Provider Note   CSN: [id number] Arrival date & time: 03/01/22  1119     History  Chief Complaint  Patient presents with   Tachycardia    Kyle Lashley. is a 87 y.o. male.  Patient with a known history of atrial flutter atrial flutter patient's been cardioverted 3 times this year.  Patient is on Eliquis.  Says has been taking all his medications.  They are planning Watchman like procedure open procedure for January.  Patient states started feeling fatigued yesterday but often means that he is going to be in a fast heart rate.  Only checks his blood pressure once a day yesterday morning heart rate was fine today heart rate was elevated 120s to 140s.  Patient denies any chest pain.  Patient denies any shortness of breath any chest pain does not feel like he is going to pass out.       Home Medications Prior to Admission medications   Medication Sig Start Date End Date Taking? Authorizing Provider  apixaban (ELIQUIS) 5 MG TABS tablet Take 1 tablet (5 mg total) by mouth 2 (two) times daily. 11/10/21   Arnoldo Lenis, MD  Cholecalciferol (VITAMIN D) 50 MCG (2000 UT) tablet Take 8,000 Units by mouth daily.    [provider]  clobetasol cream (TEMOVATE) 3.81 % Apply 1 application topically 2 (two) times daily as needed (blisters).    [provider]  fish oil-omega-3 fatty acids 1000 MG capsule Take 1 g by mouth 3 (three) times daily.    [provider]  gabapentin (NEURONTIN) 600 MG tablet Take 1 tablet (600 mg total) by mouth daily. 12/29/21   Marcial Pacas, MD  Garlic (GARLIQUE PO) Take 1 tablet by mouth daily.    [provider]  HYDROcodone-acetaminophen (NORCO/VICODIN) 5-325 MG tablet Take 1 tablet by mouth 2 (two) times daily. 11/06/21   [provider]  levETIRAcetam (KEPPRA) 500 MG tablet Take 1/2 tablet in the morning, take 1 tablet in the evening 12/29/21   Marcial Pacas, MD  losartan (COZAAR) 25 MG  tablet Take 1 tablet (25 mg total) by mouth in the morning and at bedtime. 01/07/21   Imogene Burn, PA-C  magnesium oxide (MAG-OX) 400 (240 Mg) MG tablet Take 400 mg by mouth at bedtime.    [provider]  niacinamide 500 MG tablet Take 500 mg by mouth 3 (three) times daily.    [provider]  oxymetazoline (AFRIN) 0.05 % nasal spray Place 1 spray into both nostrils 2 (two) times daily as needed for congestion.    [provider]  Polyethyl Glycol-Propyl Glycol 0.4-0.3 % SOLN Place 1 drop into both eyes 2 (two) times daily.    [provider]  predniSONE (DELTASONE) 50 MG tablet For Cardiac CT: Prednisone 50 mg - take 13 hours prior to test Take another Prednisone 50 mg 7 hours prior to test Take another Prednisone 50 mg 1 hour prior to test Take Benadryl 50 mg 1 hour prior to test 12/16/21   Vickie Epley, MD  pyridostigmine (MESTINON) 60 MG tablet Take 90 mg by mouth 3 (three) times daily.    [provider]  simvastatin (ZOCOR) 20 MG tablet Take 20 mg by mouth at bedtime. 10/24/21   [provider]  tiZANidine (ZANAFLEX) 4 MG tablet Take 4 mg by mouth at bedtime. 07/19/20   [provider]      Allergies    Iodinated contrast media,  Sulfa antibiotics, and Bactericin [bacitracin]    Review of Systems   Review of Systems  Constitutional:  Positive for fatigue. Negative for chills and fever.  HENT:  Negative for rhinorrhea and sore throat.   Eyes:  Negative for visual disturbance.  Respiratory:  Negative for cough and shortness of breath.   Cardiovascular:  Positive for palpitations. Negative for chest pain and leg swelling.  Gastrointestinal:  Negative for abdominal pain, diarrhea, nausea and vomiting.  Genitourinary:  Negative for dysuria.  Musculoskeletal:  Negative for back pain and neck pain.  Skin:  Negative for rash.  Neurological:  Negative for dizziness, light-headedness and headaches.  Hematological:  Does not  bruise/bleed easily.  Psychiatric/Behavioral:  Negative for confusion.     Physical Exam Updated Vital Signs BP (!) 168/83 (BP Location: Left Arm)   Pulse 60   Temp 97.6 F (36.4 C) (Axillary)   Resp 16   Ht 1.702 m ('5\' 7"'$ )   Wt 68 kg   SpO2 100%   BMI 23.49 kg/m  Physical Exam Vitals and nursing note reviewed.  Constitutional:      General: He is not in acute distress.    Appearance: Normal appearance. He is well-developed.  HENT:     Head: Normocephalic and atraumatic.  Eyes:     Extraocular Movements: Extraocular movements intact.     Conjunctiva/sclera: Conjunctivae normal.     Pupils: Pupils are equal, round, and reactive to light.  Cardiovascular:     Rate and Rhythm: Tachycardia present. Rhythm irregular.     Heart sounds: No murmur heard. Pulmonary:     Effort: Pulmonary effort is normal. No respiratory distress.     Breath sounds: Normal breath sounds.  Abdominal:     Palpations: Abdomen is soft.     Tenderness: There is no abdominal tenderness.  Musculoskeletal:        General: No swelling.     Cervical back: Normal range of motion and neck supple.     Right lower leg: No edema.     Left lower leg: No edema.  Skin:    General: Skin is warm and dry.     Capillary Refill: Capillary refill takes less than 2 seconds.  Neurological:     General: No focal deficit present.     Mental Status: He is alert and oriented to person, place, and time.  Psychiatric:        Mood and Affect: Mood normal.     ED Results / Procedures / Treatments   Labs (all labs ordered are listed, but only abnormal results are displayed) Labs Reviewed  BASIC METABOLIC PANEL - Abnormal; Notable for the following components:      Result Value   Glucose, Bld 105 (*)    All other components within normal limits  CBC    EKG EKG Interpretation  Date/Time:  Sunday March 01 2022 14:30:49 EST Ventricular Rate:  61 PR Interval:  186 QRS Duration: 86 QT Interval:  433 QTC  Calculation: 437 R Axis:   39 Text Interpretation: Sinus arrhythmia Ventricular premature complex Abnormal R-wave progression, early transition Borderline T wave abnormalities S/P Cardioversion Confirmed by Fredia Sorrow [phone]) on 03/01/2022 2:39:24 PM  Radiology DG Chest Port 1 View  Result Date: 03/01/2022 CLINICAL DATA:  [id number] A-fib [facility]) [id number] EXAM: PORTABLE CHEST 1 VIEW COMPARISON:  November 13, 2021 FINDINGS: The cardiomediastinal silhouette is unchanged in contour.Chronic interstitial markings. No pleural effusion. No pneumothorax. No acute pleuroparenchymal abnormality. IMPRESSION: No acute cardiopulmonary abnormality.  Electronically Signed   By: Valentino Saxon M.D.   On: 03/01/2022 13:31    Procedures .Cardioversion  Date/Time: 03/01/2022 2:41 PM  Performed by: Fredia Sorrow, MD Authorized by: Fredia Sorrow, MD   Consent:    Consent obtained:  Written   Consent given by:  Patient   Risks discussed:  Induced arrhythmia, pain, death and cutaneous burn   Alternatives discussed:  Rate-control medication Pre-procedure details:    Cardioversion basis:  Elective   Rhythm:  Atrial fibrillation   Electrode placement:  Anterior-posterior Patient sedated: Yes. Refer to sedation procedure documentation for details of sedation.  Attempt one:    Cardioversion mode:  Synchronous   Waveform:  Biphasic   Shock (Joules):  200   Shock outcome:  Conversion to normal sinus rhythm Post-procedure details:    Patient status:  Awake   Patient tolerance of procedure:  Tolerated well, no immediate complications     Medications Ordered in ED Medications  0.9 %  sodium chloride infusion (has no administration in time range)  etomidate (AMIDATE) injection 10 mg (10 mg Intravenous Given 03/01/22 1419)  etomidate (AMIDATE) injection (5 mg Intravenous Given 03/01/22 1419)  etomidate (AMIDATE) injection (5 mg Intravenous Given 03/01/22 1420)    ED Course/ Medical Decision Making/  A&P                           Medical Decision Making Amount and/or Complexity of Data Reviewed Labs: ordered. Radiology: ordered.  Risk Prescription drug management.   CRITICAL CARE Performed by: Fredia Sorrow Total critical care time: 45 minutes Critical care time was exclusive of separately billable procedures and treating other patients. Critical care was necessary to treat or prevent imminent or life-threatening deterioration. Critical care was time spent personally by me on the following activities: development of treatment plan with patient and/or surrogate as well as nursing, discussions with consultants, evaluation of patient's response to treatment, examination of patient, obtaining history from patient or surrogate, ordering and performing treatments and interventions, ordering and review of laboratory studies, ordering and review of radiographic studies, pulse oximetry and re-evaluation of patient's condition.  Patient candidate for cardioversion.  Patient's been taking his Eliquis.  Patient followed by cardiology.  They are planning an open Marchman type procedure for January.  EKG most likely atrial fibrillation.  Cardiac monitoring is more suggestive atrial fibrillation.  Because its irregular.  There is also PVCs.  Patient very stable hemodynamically stable blood pressure 139/88 oxygen saturation 97%.  We will plan on cardioversion.  With conscious sedation with etomidate.  Patient previously cardioverted with 200 J biphasic.  We will plan on using the same amount.  Patient received 10 mg of etomidate.  Patient had CO2 monitoring and cardiac monitoring was on 2 L of oxygen.  Patient tolerated sedation well.  Patient was cardioverted with 200 J synchronized biphasic and successfully cardioverted from atrial fibrillation to sinus rhythm with a little bit of sinus arrhythmia.  Patient awoke no complications.  Patient back to baseline.  We will observe patient if he  remains in sinus patient can be discharged home follow-up with cardiology.  CRITICAL CARE Performed by: Fredia Sorrow Total critical care time: 35 minutes Critical care time was exclusive of separately billable procedures and treating other patients. Critical care was necessary to treat or prevent imminent or life-threatening deterioration. Critical care was time spent personally by me on the following activities: development of treatment plan with patient and/or surrogate as  well as nursing, discussions with consultants, evaluation of patient's response to treatment, examination of patient, obtaining history from patient or surrogate, ordering and performing treatments and interventions, ordering and review of laboratory studies, ordering and review of radiographic studies, pulse oximetry and re-evaluation of patient's condition.      Final Clinical Impression(s) / ED Diagnoses Final diagnoses:  Atrial fibrillation with RVR [facility])    Rx / DC Orders ED Discharge Orders     None         Fredia Sorrow, MD 03/01/22 1331    Fredia Sorrow, MD 03/01/22 1333    Fredia Sorrow, MD 03/01/22 1446    Fredia Sorrow, MD 03/01/22 [phone]

## 2022-03-01 NOTE — ED Triage Notes (Signed)
Pt presents with rapid HR that he noticed when he was checking his B/P at home this AM. Pt's only complaint was some fatigue that started yesterday. Pt has hx of a-fib with electrical cardioversion. Pt denies CP, ShOB, lightheadedness.

## 2022-03-01 NOTE — Discharge Instructions (Signed)
Contact your cardiologist about the cardioversion.  Return for any new or worse symptoms or any rapid heart rate.

## 2022-03-03 ENCOUNTER — Telehealth: Payer: Self-pay | Admitting: Cardiology

## 2022-03-03 MED ORDER — AMIODARONE HCL 200 MG PO TABS: ORAL_TABLET | ORAL | 0 refills | Status: DC

## 2022-03-03 NOTE — Telephone Encounter (Signed)
Left a message for patient to call office back .  

## 2022-03-03 NOTE — Telephone Encounter (Signed)
Patient returning call.

## 2022-03-03 NOTE — Telephone Encounter (Signed)
Pt came in the office and stated that he had to go to the ER this weekend and that they had to shock his heart. Pt stated the dr told him to let Dr. Harl Bowie know.

## 2022-03-03 NOTE — Telephone Encounter (Signed)
Can we start him on amiodarone '200mg'$  bid x 1 month, then '200mg'$  daily please. This will work to help keep heart in rhythm  Zandra Abts MD

## 2022-03-03 NOTE — Telephone Encounter (Signed)
Patient notified and verbalized understanding. 

## 2022-03-16 DIAGNOSIS — Z5181 Encounter for therapeutic drug level monitoring: Secondary | ICD-10-CM | POA: Diagnosis not present

## 2022-03-16 DIAGNOSIS — M5136 Other intervertebral disc degeneration, lumbar region: Secondary | ICD-10-CM | POA: Diagnosis not present

## 2022-03-16 DIAGNOSIS — I10 Essential (primary) hypertension: Secondary | ICD-10-CM | POA: Diagnosis not present

## 2022-03-16 DIAGNOSIS — D6869 Other thrombophilia: Secondary | ICD-10-CM | POA: Diagnosis not present

## 2022-03-16 DIAGNOSIS — Z79899 Other long term (current) drug therapy: Secondary | ICD-10-CM | POA: Diagnosis not present

## 2022-03-16 DIAGNOSIS — I4891 Unspecified atrial fibrillation: Secondary | ICD-10-CM | POA: Diagnosis not present

## 2022-03-24 ENCOUNTER — Telehealth: Payer: Self-pay | Admitting: Cardiology

## 2022-03-24 DIAGNOSIS — Z79899 Other long term (current) drug therapy: Secondary | ICD-10-CM

## 2022-03-24 NOTE — Telephone Encounter (Signed)
Pt came in office and gave me his BP log for Nov 1-Dec 12. He wants to know if the amiodarone he is taking is causing his BP to be high.

## 2022-03-25 ENCOUNTER — Ambulatory Visit: Payer: Self-pay | Admitting: *Deleted

## 2022-03-25 ENCOUNTER — Encounter: Payer: Self-pay | Admitting: *Deleted

## 2022-03-25 MED ORDER — LOSARTAN POTASSIUM 50 MG PO TABS: 50.0000 mg | ORAL_TABLET | Freq: Two times a day (BID) | ORAL | 3 refills | Status: DC

## 2022-03-25 NOTE — Telephone Encounter (Signed)
Patient notified and verbalized understanding. 

## 2022-03-25 NOTE — Telephone Encounter (Signed)
Amiodarone would not increase blood pressure. Can he verify taking losartan '25mg'$  bid, if so increase to '50mg'$  bid and would need a bmet in 2 weeks please  Zandra Abts MD

## 2022-03-25 NOTE — Patient Outreach (Signed)
  Care Coordination   Follow Up Visit Note   03/25/2022 Name: Kyle [name]. MRN: [id number] DOB: [birth year]  Kyle [name]. is a 87 y.o. year old male who sees Redmond School, MD for primary care. I spoke with  Kyle [name]. by phone today.  What matters to the patients health and wellness today?  AFib Management and schedule procedure with Dr Tenny Craw    Goals Addressed             This Visit's Progress    Care Coordination Services       Care Coordination Interventions: Counseled on increased risk of stroke due to Afib and benefits of anticoagulation for stroke prevention Reviewed importance of adherence to anticoagulant exactly as prescribed Screening for signs and symptoms of depression related to chronic disease state  Reviewed and discussed ED visit in Jan for Afib Reviewed and discussed recent visit with Dr Tenny Craw (cardiothoracic surgeon) regarding TAA procedure for Afib management. Patient is not a candidate for the Watchmen device because of his allergy to contrast dye.  Reviewed upcoming telephone appt with Dr Tenny Craw on 04/02/22 at 1:30 to discuss procedure Reviewed medications and discussed use of Eliquis until after procedure. May have to remain on it for a short time afterwards. Patient would like to be able to resume taking NSAIDs for back pain but knows to hold off until Eliquis has been d/c.  Discussed use of tylenol for pain and other management techniques Provided with [facility] telephone number and encouraged to reach out as needed        SDOH assessments and interventions completed:  Yes  SDOH Interventions Today    Flowsheet Row Most Recent Value  SDOH Interventions   Food Insecurity Interventions Intervention Not Indicated  Housing Interventions Intervention Not Indicated  Transportation Interventions Intervention Not Indicated  Utilities Interventions Intervention Not Indicated  Financial Strain Interventions Intervention Not Indicated         Care Coordination Interventions:  Yes, provided   Follow up plan: Follow up call scheduled for 04/21/22    Encounter Outcome:  Pt. Visit Completed   Chong Sicilian, BSN, RN-BC RN Care Coordinator [place]: [phone] Main #: [phone]

## 2022-04-02 ENCOUNTER — Other Ambulatory Visit: Payer: Self-pay | Admitting: Cardiothoracic Surgery

## 2022-04-02 ENCOUNTER — Ambulatory Visit (INDEPENDENT_AMBULATORY_CARE_PROVIDER_SITE_OTHER): Payer: Self-pay | Admitting: Cardiothoracic Surgery

## 2022-04-02 ENCOUNTER — Other Ambulatory Visit: Payer: Self-pay | Admitting: *Deleted

## 2022-04-02 DIAGNOSIS — I4891 Unspecified atrial fibrillation: Secondary | ICD-10-CM

## 2022-04-02 DIAGNOSIS — I4819 Other persistent atrial fibrillation: Secondary | ICD-10-CM

## 2022-04-02 DIAGNOSIS — I4892 Unspecified atrial flutter: Secondary | ICD-10-CM

## 2022-04-02 NOTE — Progress Notes (Signed)
Phone visit today  Regarding upcoming LAA clip  Expect 2 day hospital stay  Reviewed expected hospital course along with risks/benefits/alternatives (97% standard recovery, <1% mortality, 2% morbidity [bleeding, infection, damange to surrounding structures, issues arising from general anesthetic in elderly])  Planning on Jan 8th LAA clip, left thoracoscopic.    Justice Rocher MD CV Surgery

## 2022-04-03 ENCOUNTER — Encounter: Payer: Self-pay | Admitting: *Deleted

## 2022-04-07 NOTE — Addendum Note (Signed)
Addended by: Barbarann Ehlers A on: 04/07/2022 11:23 AM   Modules accepted: Orders

## 2022-04-14 DIAGNOSIS — J309 Allergic rhinitis, unspecified: Secondary | ICD-10-CM | POA: Diagnosis not present

## 2022-04-14 DIAGNOSIS — G7000 Myasthenia gravis without (acute) exacerbation: Secondary | ICD-10-CM | POA: Diagnosis not present

## 2022-04-14 DIAGNOSIS — Z6822 Body mass index (BMI) 22.0-22.9, adult: Secondary | ICD-10-CM | POA: Diagnosis not present

## 2022-04-15 NOTE — Progress Notes (Signed)
Surgical Instructions    Your procedure is scheduled on Monday, 04/20/22.  Report to [facility] Main Entrance "A" at 11:00 A.M., then check in with the Admitting office.  Call this number if you have problems the morning of surgery:  [phone]   If you have any questions prior to your surgery date call [phone]: Open Monday-Friday 8am-4pm If you experience any cold or flu symptoms such as cough, fever, chills, shortness of breath, etc. between now and your scheduled surgery, please notify us at the above number     Remember:  Do not eat or drink after midnight the night before your surgery     Take these medicines the morning of surgery with A SIP OF WATER:  amiodarone (PACERONE)  HYDROcodone-acetaminophen (NORCO/VICODIN)  pyridostigmine (MESTINON)   IF NEEDED: clobetasol (TEMOVATE)  oxymetazoline (AFRIN)   As of today, STOP taking any Aspirin (unless otherwise instructed by your surgeon) Aleve, Naproxen, Ibuprofen, Motrin, Advil, Goody's, BC's, all herbal medications, fish oil, and all vitamins.  STOP ELIQUIS 5 DAYS PRIOR TO SURGERY.           Do not wear jewelry or makeup. Do not wear lotions, powders, cologne or deodorant. Men may shave face and neck. Do not bring valuables to the hospital. Do not wear nail polish, gel polish, artificial nails, or any other type of covering on natural nails (fingers and toes) If you have artificial nails or gel coating that need to be removed by a nail salon, please have this removed prior to surgery. Artificial nails or gel coating may interfere with anesthesia's ability to adequately monitor your vital signs.  [facility] is not responsible for any belongings or valuables.    Do NOT Smoke (Tobacco/Vaping)  24 hours prior to your procedure  If you use a CPAP at night, you may bring your mask for your overnight stay.   Contacts, glasses, hearing aids, dentures or partials may not be worn into surgery, please bring cases for these  belongings   For patients admitted to the hospital, discharge time will be determined by your treatment team.   Patients discharged the day of surgery will not be allowed to drive home, and someone needs to stay with them for 24 hours.   SURGICAL WAITING ROOM VISITATION Patients having surgery or a procedure may have no more than 2 support people in the waiting area - these visitors may rotate.   Children under the age of 26 must have an adult with them who is not the patient. If the patient needs to stay at the hospital during part of their recovery, the visitor guidelines for inpatient rooms apply. Pre-op nurse will coordinate an appropriate time for 1 support person to accompany patient in pre-op.  This support person may not rotate.   Please refer to [email or web address] for the visitor guidelines for Inpatients (after your surgery is over and you are in a regular room).    Special instructions:    Oral Hygiene is also important to reduce your risk of infection.  Remember - BRUSH YOUR TEETH THE MORNING OF SURGERY WITH YOUR REGULAR TOOTHPASTE   [facility]- Preparing For Surgery  Before surgery, you can play an important role. Because skin is not sterile, your skin needs to be as free of germs as possible. You can reduce the number of germs on your skin by washing with CHG (chlorahexidine gluconate) Soap before surgery.  CHG is an antiseptic cleaner which kills germs and bonds with the skin to  continue killing germs even after washing.     Please do not use if you have an allergy to CHG or antibacterial soaps. If your skin becomes reddened/irritated stop using the CHG.  Do not shave (including legs and underarms) for at least 48 hours prior to first CHG shower. It is OK to shave your face.  Please follow these instructions carefully.     Shower the NIGHT BEFORE SURGERY and the MORNING OF SURGERY with CHG Soap.   If you chose to wash  your hair, wash your hair first as usual with your normal shampoo. After you shampoo, rinse your hair and body thoroughly to remove the shampoo.  Then [employer] and genitals (private parts) with your normal soap and rinse thoroughly to remove soap.  After that Use CHG Soap as you would any other liquid soap. You can apply CHG directly to the skin and wash gently with a scrungie or a clean washcloth.   Apply the CHG Soap to your body ONLY FROM THE NECK DOWN.  Do not use on open wounds or open sores. Avoid contact with your eyes, ears, mouth and genitals (private parts). Wash Face and genitals (private parts)  with your normal soap.   Wash thoroughly, paying special attention to the area where your surgery will be performed.  Thoroughly rinse your body with warm water from the neck down.  DO NOT shower/wash with your normal soap after using and rinsing off the CHG Soap.  Pat yourself dry with a CLEAN TOWEL.  Wear CLEAN PAJAMAS to bed the night before surgery  Place CLEAN SHEETS on your bed the night before your surgery  DO NOT SLEEP WITH PETS.   Day of Surgery: Take a shower with CHG soap. Wear Clean/Comfortable clothing the morning of surgery Do not apply any deodorants/lotions.   Remember to brush your teeth WITH YOUR REGULAR TOOTHPASTE.    If you received a COVID test during your pre-op visit, it is requested that you wear a mask when out in public, stay away from anyone that may not be feeling well, and notify your surgeon if you develop symptoms. If you have been in contact with anyone that has tested positive in the last 10 days, please notify your surgeon.    Please read over the following fact sheets that you were given.

## 2022-04-16 ENCOUNTER — Other Ambulatory Visit: Payer: Self-pay

## 2022-04-16 ENCOUNTER — Ambulatory Visit (HOSPITAL_COMMUNITY)
Admission: RE | Admit: 2022-04-16 | Discharge: 2022-04-16 | Disposition: A | Discharge status: Home / Self Care | Payer: Medicare Other | Source: Ambulatory Visit | Attending: Cardiothoracic Surgery | Admitting: Cardiothoracic Surgery

## 2022-04-16 ENCOUNTER — Encounter (HOSPITAL_COMMUNITY)
Admission: RE | Admit: 2022-04-16 | Discharge: 2022-04-16 | Disposition: A | Discharge status: Home / Self Care | Payer: Medicare Other | Source: Ambulatory Visit | Attending: Cardiothoracic Surgery | Admitting: Cardiothoracic Surgery

## 2022-04-16 ENCOUNTER — Encounter (HOSPITAL_COMMUNITY): Payer: Self-pay

## 2022-04-16 VITALS — BP 139/61 | HR 50 | Temp 97.9°F | Resp 18 | Ht 68.0 in | Wt 155.9 lb

## 2022-04-16 DIAGNOSIS — I4892 Unspecified atrial flutter: Secondary | ICD-10-CM | POA: Insufficient documentation

## 2022-04-16 DIAGNOSIS — E042 Nontoxic multinodular goiter: Secondary | ICD-10-CM | POA: Diagnosis not present

## 2022-04-16 DIAGNOSIS — I4891 Unspecified atrial fibrillation: Secondary | ICD-10-CM | POA: Diagnosis not present

## 2022-04-16 DIAGNOSIS — I700 Atherosclerosis of aorta: Secondary | ICD-10-CM | POA: Insufficient documentation

## 2022-04-16 DIAGNOSIS — Z1152 Encounter for screening for COVID-19: Secondary | ICD-10-CM | POA: Diagnosis not present

## 2022-04-16 DIAGNOSIS — Z01818 Encounter for other preprocedural examination: Secondary | ICD-10-CM

## 2022-04-16 DIAGNOSIS — I2510 Atherosclerotic heart disease of native coronary artery without angina pectoris: Secondary | ICD-10-CM | POA: Diagnosis not present

## 2022-04-16 HISTORY — DX: Chronic kidney disease, unspecified: N18.9

## 2022-04-16 HISTORY — DX: Personal history of urinary calculi: Z87.442

## 2022-04-16 LAB — BLOOD GAS, ARTERIAL
Acid-Base Excess: 2.7 mmol/L — ABNORMAL HIGH (ref 0.0–2.0)
Bicarbonate: 26.2 mmol/L (ref 20.0–28.0)
Drawn by: 6643
O2 Saturation: 99 %
Patient temperature: 37
pCO2 arterial: 36 mmHg (ref 32–48)
pH, Arterial: 7.47 — ABNORMAL HIGH (ref 7.35–7.45)
pO2, Arterial: 96 mmHg (ref 83–108)

## 2022-04-16 LAB — COMPREHENSIVE METABOLIC PANEL
ALT: 16 U/L (ref 0–44)
AST: 23 U/L (ref 15–41)
Albumin: 3.6 g/dL (ref 3.5–5.0)
Alkaline Phosphatase: 55 U/L (ref 38–126)
Anion gap: 9 (ref 5–15)
BUN: 32 mg/dL — ABNORMAL HIGH (ref 8–23)
CO2: 23 mmol/L (ref 22–32)
Calcium: 9.2 mg/dL (ref 8.9–10.3)
Chloride: 102 mmol/L (ref 98–111)
Creatinine, Ser: 1.11 mg/dL (ref 0.61–1.24)
GFR, Estimated: >60 mL/min (ref >60)
Glucose, Bld: 88 mg/dL (ref 70–99)
Potassium: 4.1 mmol/L (ref 3.5–5.1)
Sodium: 134 mmol/L — ABNORMAL LOW (ref 135–145)
Total Bilirubin: 0.5 mg/dL (ref 0.3–1.2)
Total Protein: 7.1 g/dL (ref 6.5–8.1)

## 2022-04-16 LAB — CBC
HCT: 37.6 % — ABNORMAL LOW (ref 39.0–52.0)
Hemoglobin: 12.3 g/dL — ABNORMAL LOW (ref 13.0–17.0)
MCH: 30.4 pg (ref 26.0–34.0)
MCHC: 32.7 g/dL (ref 30.0–36.0)
MCV: 92.8 fL (ref 80.0–100.0)
Platelets: 285 10*3/uL (ref 150–400)
RBC: 4.05 MIL/uL — ABNORMAL LOW (ref 4.22–5.81)
RDW: 12.5 % (ref 11.5–15.5)
WBC: 14 10*3/uL — ABNORMAL HIGH (ref 4.0–10.5)
nRBC: 0 % (ref 0.0–0.2)

## 2022-04-16 LAB — PROTIME-INR
INR: 1.1 (ref 0.8–1.2)
Prothrombin Time: 14 seconds (ref 11.4–15.2)

## 2022-04-16 LAB — URINALYSIS, ROUTINE W REFLEX MICROSCOPIC
Bilirubin Urine: NEGATIVE
Glucose, UA: NEGATIVE mg/dL
Hgb urine dipstick: NEGATIVE
Ketones, ur: NEGATIVE mg/dL
Leukocytes,Ua: NEGATIVE
Nitrite: NEGATIVE
Protein, ur: NEGATIVE mg/dL
Specific Gravity, Urine: 1.014 (ref 1.005–1.030)
pH: 7 (ref 5.0–8.0)

## 2022-04-16 LAB — SURGICAL PCR SCREEN
MRSA, PCR: NEGATIVE
Staphylococcus aureus: POSITIVE — AB

## 2022-04-16 LAB — TYPE AND SCREEN
ABO/RH(D): A POS
Antibody Screen: NEGATIVE

## 2022-04-16 LAB — APTT: aPTT: 29 seconds (ref 24–36)

## 2022-04-16 LAB — SARS CORONAVIRUS 2 BY RT PCR: SARS Coronavirus 2 by RT PCR: NEGATIVE

## 2022-04-16 NOTE — Progress Notes (Addendum)
PCP - Rafael Bihari MD Cardiologist - Dr Carlyle Dolly  PPM/ICD - Denies  Chest x-ray -  DOS EKG - 04/16/2021 Stress Test - 12/05/2020 ECHO - 10/15/2021 Cardiac Cath - Denies  Sleep Study - Yes, per pt no dx of OSA CPAP - Denies  DM-  Denies    Blood Thinner Instructions: Per MD note stop 5 days prior.  Per pt las dose Wednesday 04/15/2021 Aspirin Instructions: Denies taking ASA  ERAS Protcol - N/A.  NPO ordered   COVID TEST- 04/16/2021 at PAT appointment   Anesthesia review: Yes.  Cardiac hx/EKG review.  Elevated BUN  Patient denies shortness of breath, fever, cough and chest pain at PAT appointment   All instructions explained to the patient, with a verbal understanding of the material. Patient agrees to go over the instructions while at home for a better understanding. Patient also instructed to self quarantine after being tested for COVID-19. The opportunity to ask questions was provided.

## 2022-04-17 ENCOUNTER — Telehealth: Payer: Self-pay

## 2022-04-17 NOTE — Telephone Encounter (Signed)
Any updates on bp? We had increased his losartan to '50mg'$  daily on 03/25/22   Zandra Abts MD      I spoke with wife. She states Kyle [name]'s systolic BP is running in the 122'Z and diastolic running 83-46

## 2022-04-20 ENCOUNTER — Inpatient Hospital Stay (HOSPITAL_COMMUNITY): Payer: Medicare Other | Admitting: Physician Assistant

## 2022-04-20 ENCOUNTER — Inpatient Hospital Stay (HOSPITAL_COMMUNITY): Payer: Medicare Other

## 2022-04-20 ENCOUNTER — Encounter (HOSPITAL_COMMUNITY): Payer: Self-pay | Admitting: Cardiothoracic Surgery

## 2022-04-20 ENCOUNTER — Inpatient Hospital Stay (HOSPITAL_COMMUNITY)
Admission: RE | Admit: 2022-04-20 | Discharge: 2022-04-22 | DRG: 274 | Disposition: A | Discharge status: Home / Self Care | Payer: Medicare Other | Attending: Cardiothoracic Surgery | Admitting: Cardiothoracic Surgery

## 2022-04-20 ENCOUNTER — Other Ambulatory Visit: Payer: Self-pay

## 2022-04-20 ENCOUNTER — Encounter (HOSPITAL_COMMUNITY)
Admission: RE | Disposition: A | Discharge status: Home / Self Care | Payer: Self-pay | Source: Home / Self Care | Attending: Cardiothoracic Surgery

## 2022-04-20 DIAGNOSIS — I4819 Other persistent atrial fibrillation: Secondary | ICD-10-CM | POA: Diagnosis not present

## 2022-04-20 DIAGNOSIS — Z86718 Personal history of other venous thrombosis and embolism: Secondary | ICD-10-CM | POA: Diagnosis not present

## 2022-04-20 DIAGNOSIS — Z9049 Acquired absence of other specified parts of digestive tract: Secondary | ICD-10-CM | POA: Diagnosis not present

## 2022-04-20 DIAGNOSIS — Z91041 Radiographic dye allergy status: Secondary | ICD-10-CM | POA: Diagnosis not present

## 2022-04-20 DIAGNOSIS — I10 Essential (primary) hypertension: Secondary | ICD-10-CM | POA: Diagnosis not present

## 2022-04-20 DIAGNOSIS — Z8249 Family history of ischemic heart disease and other diseases of the circulatory system: Secondary | ICD-10-CM | POA: Diagnosis not present

## 2022-04-20 DIAGNOSIS — Z7901 Long term (current) use of anticoagulants: Secondary | ICD-10-CM

## 2022-04-20 DIAGNOSIS — G7000 Myasthenia gravis without (acute) exacerbation: Secondary | ICD-10-CM | POA: Diagnosis present

## 2022-04-20 DIAGNOSIS — Z4682 Encounter for fitting and adjustment of non-vascular catheter: Secondary | ICD-10-CM | POA: Diagnosis not present

## 2022-04-20 DIAGNOSIS — D72829 Elevated white blood cell count, unspecified: Secondary | ICD-10-CM | POA: Diagnosis not present

## 2022-04-20 DIAGNOSIS — Z8546 Personal history of malignant neoplasm of prostate: Secondary | ICD-10-CM

## 2022-04-20 DIAGNOSIS — N289 Disorder of kidney and ureter, unspecified: Secondary | ICD-10-CM

## 2022-04-20 DIAGNOSIS — E785 Hyperlipidemia, unspecified: Secondary | ICD-10-CM | POA: Diagnosis not present

## 2022-04-20 DIAGNOSIS — I454 Nonspecific intraventricular block: Secondary | ICD-10-CM | POA: Diagnosis not present

## 2022-04-20 DIAGNOSIS — Z79899 Other long term (current) drug therapy: Secondary | ICD-10-CM | POA: Diagnosis not present

## 2022-04-20 DIAGNOSIS — M48061 Spinal stenosis, lumbar region without neurogenic claudication: Secondary | ICD-10-CM | POA: Diagnosis present

## 2022-04-20 DIAGNOSIS — I4891 Unspecified atrial fibrillation: Secondary | ICD-10-CM

## 2022-04-20 DIAGNOSIS — Z801 Family history of malignant neoplasm of trachea, bronchus and lung: Secondary | ICD-10-CM | POA: Diagnosis not present

## 2022-04-20 DIAGNOSIS — M47816 Spondylosis without myelopathy or radiculopathy, lumbar region: Secondary | ICD-10-CM | POA: Diagnosis not present

## 2022-04-20 DIAGNOSIS — Z882 Allergy status to sulfonamides status: Secondary | ICD-10-CM | POA: Diagnosis not present

## 2022-04-20 DIAGNOSIS — M1990 Unspecified osteoarthritis, unspecified site: Secondary | ICD-10-CM

## 2022-04-20 DIAGNOSIS — Z791 Long term (current) use of non-steroidal anti-inflammatories (NSAID): Secondary | ICD-10-CM | POA: Diagnosis not present

## 2022-04-20 DIAGNOSIS — G40909 Epilepsy, unspecified, not intractable, without status epilepticus: Secondary | ICD-10-CM | POA: Diagnosis present

## 2022-04-20 DIAGNOSIS — Z820 Family history of epilepsy and other diseases of the nervous system: Secondary | ICD-10-CM

## 2022-04-20 DIAGNOSIS — Z833 Family history of diabetes mellitus: Secondary | ICD-10-CM

## 2022-04-20 DIAGNOSIS — I350 Nonrheumatic aortic (valve) stenosis: Secondary | ICD-10-CM | POA: Diagnosis not present

## 2022-04-20 DIAGNOSIS — Z888 Allergy status to other drugs, medicaments and biological substances status: Secondary | ICD-10-CM

## 2022-04-20 DIAGNOSIS — Z01818 Encounter for other preprocedural examination: Secondary | ICD-10-CM | POA: Diagnosis not present

## 2022-04-20 DIAGNOSIS — I480 Paroxysmal atrial fibrillation: Secondary | ICD-10-CM | POA: Diagnosis present

## 2022-04-20 DIAGNOSIS — G629 Polyneuropathy, unspecified: Secondary | ICD-10-CM | POA: Diagnosis present

## 2022-04-20 DIAGNOSIS — J9811 Atelectasis: Secondary | ICD-10-CM | POA: Diagnosis not present

## 2022-04-20 DIAGNOSIS — Z86011 Personal history of benign neoplasm of the brain: Secondary | ICD-10-CM | POA: Diagnosis not present

## 2022-04-20 DIAGNOSIS — R339 Retention of urine, unspecified: Secondary | ICD-10-CM | POA: Diagnosis not present

## 2022-04-20 HISTORY — PX: CLIPPING OF ATRIAL APPENDAGE: SHX5773

## 2022-04-20 HISTORY — PX: VIDEO ASSISTED THORACOSCOPY: SHX5073

## 2022-04-20 HISTORY — PX: TEE WITHOUT CARDIOVERSION: SHX5443

## 2022-04-20 LAB — POCT I-STAT, CHEM 8
BUN: 20 mg/dL (ref 8–23)
Calcium, Ion: 1.14 mmol/L — ABNORMAL LOW (ref 1.15–1.40)
Chloride: 109 mmol/L (ref 98–111)
Creatinine, Ser: 0.9 mg/dL (ref 0.61–1.24)
Glucose, Bld: 123 mg/dL — ABNORMAL HIGH (ref 70–99)
HCT: 30 % — ABNORMAL LOW (ref 39.0–52.0)
Hemoglobin: 10.2 g/dL — ABNORMAL LOW (ref 13.0–17.0)
Potassium: 3.8 mmol/L (ref 3.5–5.1)
Sodium: 139 mmol/L (ref 135–145)
TCO2: 21 mmol/L — ABNORMAL LOW (ref 22–32)

## 2022-04-20 LAB — POCT I-STAT 7, (LYTES, BLD GAS, ICA,H+H)
Acid-base deficit: 3 mmol/L — ABNORMAL HIGH (ref 0.0–2.0)
Bicarbonate: 21.4 mmol/L (ref 20.0–28.0)
Calcium, Ion: 1.22 mmol/L (ref 1.15–1.40)
HCT: 32 % — ABNORMAL LOW (ref 39.0–52.0)
Hemoglobin: 10.9 g/dL — ABNORMAL LOW (ref 13.0–17.0)
O2 Saturation: 100 %
Potassium: 3.9 mmol/L (ref 3.5–5.1)
Sodium: 140 mmol/L (ref 135–145)
TCO2: 23 mmol/L (ref 22–32)
pCO2 arterial: 35.6 mmHg (ref 32–48)
pH, Arterial: 7.387 (ref 7.35–7.45)
pO2, Arterial: 257 mmHg — ABNORMAL HIGH (ref 83–108)

## 2022-04-20 LAB — ABO/RH: ABO/RH(D): A POS

## 2022-04-20 SURGERY — VIDEO ASSISTED THORACOSCOPY
Anesthesia: General

## 2022-04-20 MED ORDER — BISACODYL 5 MG PO TBEC
10.0000 mg | DELAYED_RELEASE_TABLET | Freq: Every day | ORAL | Status: DC
  Administered 2022-04-20: 10 mg via ORAL
  Filled 2022-04-20 (×2): qty 2

## 2022-04-20 MED ORDER — DEXAMETHASONE SODIUM PHOSPHATE 10 MG/ML IJ SOLN
INTRAMUSCULAR | Status: DC | PRN
  Administered 2022-04-20: 10 mg via INTRAVENOUS

## 2022-04-20 MED ORDER — LEVETIRACETAM 500 MG PO TABS
500.0000 mg | ORAL_TABLET | Freq: Every day | ORAL | Status: DC
  Administered 2022-04-20 – 2022-04-21 (×2): 500 mg via ORAL
  Filled 2022-04-20 (×2): qty 1

## 2022-04-20 MED ORDER — AMIODARONE HCL 200 MG PO TABS
200.0000 mg | ORAL_TABLET | Freq: Every day | ORAL | Status: DC
  Administered 2022-04-20 – 2022-04-22 (×3): 200 mg via ORAL
  Filled 2022-04-20 (×3): qty 1

## 2022-04-20 MED ORDER — SENNOSIDES-DOCUSATE SODIUM 8.6-50 MG PO TABS
1.0000 | ORAL_TABLET | Freq: Every day | ORAL | Status: DC
  Administered 2022-04-20 – 2022-04-21 (×2): 1 via ORAL
  Filled 2022-04-20 (×2): qty 1

## 2022-04-20 MED ORDER — EPHEDRINE SULFATE-NACL 50-0.9 MG/10ML-% IV SOSY
PREFILLED_SYRINGE | INTRAVENOUS | Status: DC | PRN
  Administered 2022-04-20: 7.5 mg via INTRAVENOUS
  Administered 2022-04-20: 2.5 mg via INTRAVENOUS

## 2022-04-20 MED ORDER — FENTANYL CITRATE (PF) 250 MCG/5ML IJ SOLN
INTRAMUSCULAR | Status: AC
  Filled 2022-04-20: qty 5

## 2022-04-20 MED ORDER — LEVETIRACETAM 250 MG PO TABS
250.0000 mg | ORAL_TABLET | Freq: Every morning | ORAL | Status: DC
  Administered 2022-04-21 – 2022-04-22 (×2): 250 mg via ORAL
  Filled 2022-04-20 (×3): qty 1

## 2022-04-20 MED ORDER — MUPIROCIN 2 % EX OINT
1.0000 | TOPICAL_OINTMENT | Freq: Once | CUTANEOUS | Status: AC
  Administered 2022-04-20: 1 via NASAL
  Filled 2022-04-20: qty 22

## 2022-04-20 MED ORDER — POLYVINYL ALCOHOL 1.4 % OP SOLN
1.0000 [drp] | Freq: Three times a day (TID) | OPHTHALMIC | Status: DC
  Administered 2022-04-20 – 2022-04-22 (×5): 1 [drp] via OPHTHALMIC
  Filled 2022-04-20: qty 15

## 2022-04-20 MED ORDER — SUGAMMADEX SODIUM 200 MG/2ML IV SOLN
INTRAVENOUS | Status: DC | PRN
  Administered 2022-04-20: 200 mg via INTRAVENOUS

## 2022-04-20 MED ORDER — ONDANSETRON HCL 4 MG/2ML IJ SOLN
INTRAMUSCULAR | Status: AC
  Filled 2022-04-20: qty 2

## 2022-04-20 MED ORDER — AMISULPRIDE (ANTIEMETIC) 5 MG/2ML IV SOLN
10.0000 mg | Freq: Once | INTRAVENOUS | Status: AC | PRN
  Administered 2022-04-20: 10 mg via INTRAVENOUS

## 2022-04-20 MED ORDER — ACETAMINOPHEN 160 MG/5ML PO SOLN
1000.0000 mg | Freq: Four times a day (QID) | ORAL | Status: DC
  Filled 2022-04-20: qty 40.6

## 2022-04-20 MED ORDER — GLYCOPYRROLATE PF 0.2 MG/ML IJ SOSY
PREFILLED_SYRINGE | INTRAMUSCULAR | Status: AC
  Filled 2022-04-20: qty 1

## 2022-04-20 MED ORDER — AMISULPRIDE (ANTIEMETIC) 5 MG/2ML IV SOLN
INTRAVENOUS | Status: AC
  Filled 2022-04-20: qty 4

## 2022-04-20 MED ORDER — BUPIVACAINE LIPOSOME 1.3 % IJ SUSP
INTRAMUSCULAR | Status: AC
  Filled 2022-04-20: qty 20

## 2022-04-20 MED ORDER — PYRIDOSTIGMINE BROMIDE 60 MG PO TABS
90.0000 mg | ORAL_TABLET | Freq: Three times a day (TID) | ORAL | Status: DC
  Administered 2022-04-20 – 2022-04-22 (×6): 90 mg via ORAL
  Filled 2022-04-20 (×6): qty 1.5

## 2022-04-20 MED ORDER — EPHEDRINE 5 MG/ML INJ
INTRAVENOUS | Status: AC
  Filled 2022-04-20: qty 5

## 2022-04-20 MED ORDER — ROCURONIUM BROMIDE 10 MG/ML (PF) SYRINGE
PREFILLED_SYRINGE | INTRAVENOUS | Status: DC | PRN
  Administered 2022-04-20: 50 mg via INTRAVENOUS
  Administered 2022-04-20: 20 mg via INTRAVENOUS

## 2022-04-20 MED ORDER — FENTANYL CITRATE (PF) 100 MCG/2ML IJ SOLN
25.0000 ug | INTRAMUSCULAR | Status: DC | PRN
  Administered 2022-04-20 (×3): 50 ug via INTRAVENOUS

## 2022-04-20 MED ORDER — ACETAMINOPHEN 500 MG PO TABS
1000.0000 mg | ORAL_TABLET | Freq: Once | ORAL | Status: AC
  Administered 2022-04-20: 1000 mg via ORAL
  Filled 2022-04-20: qty 2

## 2022-04-20 MED ORDER — SODIUM CHLORIDE 0.9 % IV SOLN: INTRAVENOUS | Status: DC

## 2022-04-20 MED ORDER — LACTATED RINGERS IV SOLN: INTRAVENOUS | Status: DC | PRN

## 2022-04-20 MED ORDER — ORAL CARE MOUTH RINSE: 15.0000 mL | Freq: Once | OROMUCOSAL | Status: AC

## 2022-04-20 MED ORDER — LACTATED RINGERS IV SOLN: INTRAVENOUS | Status: DC

## 2022-04-20 MED ORDER — PROMETHAZINE HCL 25 MG/ML IJ SOLN
6.2500 mg | INTRAMUSCULAR | Status: DC | PRN
  Administered 2022-04-20: 6.25 mg via INTRAVENOUS

## 2022-04-20 MED ORDER — DEXAMETHASONE SODIUM PHOSPHATE 10 MG/ML IJ SOLN
INTRAMUSCULAR | Status: AC
  Filled 2022-04-20: qty 1

## 2022-04-20 MED ORDER — PROPOFOL 10 MG/ML IV BOLUS
INTRAVENOUS | Status: DC | PRN
  Administered 2022-04-20: 130 mg via INTRAVENOUS

## 2022-04-20 MED ORDER — HYDROMORPHONE HCL 1 MG/ML IJ SOLN
0.2500 mg | INTRAMUSCULAR | Status: DC | PRN
  Administered 2022-04-20 (×2): 0.5 mg via INTRAVENOUS

## 2022-04-20 MED ORDER — ONDANSETRON HCL 4 MG/2ML IJ SOLN
INTRAMUSCULAR | Status: DC | PRN
  Administered 2022-04-20: 4 mg via INTRAVENOUS

## 2022-04-20 MED ORDER — ENOXAPARIN SODIUM 40 MG/0.4ML IJ SOSY
40.0000 mg | PREFILLED_SYRINGE | Freq: Every day | INTRAMUSCULAR | Status: DC
  Administered 2022-04-21 – 2022-04-22 (×2): 40 mg via SUBCUTANEOUS
  Filled 2022-04-20 (×2): qty 0.4

## 2022-04-20 MED ORDER — SIMVASTATIN 20 MG PO TABS
20.0000 mg | ORAL_TABLET | Freq: Every day | ORAL | Status: DC
  Administered 2022-04-21: 20 mg via ORAL
  Filled 2022-04-20: qty 1

## 2022-04-20 MED ORDER — BUPIVACAINE HCL (PF) 0.5 % IJ SOLN
INTRAMUSCULAR | Status: AC
  Filled 2022-04-20: qty 30

## 2022-04-20 MED ORDER — HYDROMORPHONE HCL 1 MG/ML IJ SOLN
INTRAMUSCULAR | Status: AC
  Filled 2022-04-20: qty 1

## 2022-04-20 MED ORDER — CEFAZOLIN SODIUM-DEXTROSE 2-4 GM/100ML-% IV SOLN
2.0000 g | Freq: Three times a day (TID) | INTRAVENOUS | Status: AC
  Administered 2022-04-20 – 2022-04-21 (×2): 2 g via INTRAVENOUS
  Filled 2022-04-20 (×2): qty 100

## 2022-04-20 MED ORDER — LIDOCAINE 2% (20 MG/ML) 5 ML SYRINGE
INTRAMUSCULAR | Status: AC
  Filled 2022-04-20: qty 5

## 2022-04-20 MED ORDER — FENTANYL CITRATE (PF) 100 MCG/2ML IJ SOLN
INTRAMUSCULAR | Status: AC
  Filled 2022-04-20: qty 2

## 2022-04-20 MED ORDER — FENTANYL CITRATE (PF) 250 MCG/5ML IJ SOLN
INTRAMUSCULAR | Status: DC | PRN
  Administered 2022-04-20: 100 ug via INTRAVENOUS
  Administered 2022-04-20: 25 ug via INTRAVENOUS
  Administered 2022-04-20: 50 ug via INTRAVENOUS
  Administered 2022-04-20 (×3): 25 ug via INTRAVENOUS

## 2022-04-20 MED ORDER — 0.9 % SODIUM CHLORIDE (POUR BTL) OPTIME
TOPICAL | Status: DC | PRN
  Administered 2022-04-20: 5000 mL

## 2022-04-20 MED ORDER — CEFAZOLIN SODIUM-DEXTROSE 2-4 GM/100ML-% IV SOLN
2.0000 g | INTRAVENOUS | Status: AC
  Administered 2022-04-20: 2 g via INTRAVENOUS
  Filled 2022-04-20: qty 100

## 2022-04-20 MED ORDER — ROCURONIUM BROMIDE 10 MG/ML (PF) SYRINGE
PREFILLED_SYRINGE | INTRAVENOUS | Status: AC
  Filled 2022-04-20: qty 10

## 2022-04-20 MED ORDER — MORPHINE SULFATE (PF) 2 MG/ML IV SOLN
INTRAVENOUS | Status: AC
  Filled 2022-04-20: qty 1

## 2022-04-20 MED ORDER — MORPHINE SULFATE (PF) 2 MG/ML IV SOLN
2.0000 mg | INTRAVENOUS | Status: DC | PRN
  Administered 2022-04-20 – 2022-04-21 (×4): 2 mg via INTRAVENOUS
  Filled 2022-04-20 (×3): qty 1

## 2022-04-20 MED ORDER — BUPIVACAINE LIPOSOME 1.3 % IJ SUSP
INTRAMUSCULAR | Status: DC | PRN
  Administered 2022-04-20: 40 mL

## 2022-04-20 MED ORDER — TIZANIDINE HCL 4 MG PO TABS
4.0000 mg | ORAL_TABLET | Freq: Every day | ORAL | Status: DC
  Administered 2022-04-21: 4 mg via ORAL
  Filled 2022-04-20: qty 1

## 2022-04-20 MED ORDER — ACETAMINOPHEN 500 MG PO TABS
1000.0000 mg | ORAL_TABLET | Freq: Four times a day (QID) | ORAL | Status: DC
  Administered 2022-04-20 – 2022-04-22 (×7): 1000 mg via ORAL
  Filled 2022-04-20 (×7): qty 2

## 2022-04-20 MED ORDER — LEVETIRACETAM 250 MG PO TABS
250.0000 mg | ORAL_TABLET | Freq: Two times a day (BID) | ORAL | Status: DC
  Filled 2022-04-20: qty 1

## 2022-04-20 MED ORDER — TRAMADOL HCL 50 MG PO TABS
50.0000 mg | ORAL_TABLET | Freq: Four times a day (QID) | ORAL | Status: DC | PRN
  Administered 2022-04-21 (×3): 50 mg via ORAL
  Filled 2022-04-20 (×3): qty 1

## 2022-04-20 MED ORDER — LOSARTAN POTASSIUM 50 MG PO TABS
50.0000 mg | ORAL_TABLET | Freq: Every day | ORAL | Status: DC
  Filled 2022-04-20: qty 1

## 2022-04-20 MED ORDER — LIDOCAINE 2% (20 MG/ML) 5 ML SYRINGE
INTRAMUSCULAR | Status: DC | PRN
  Administered 2022-04-20: 60 mg via INTRAVENOUS

## 2022-04-20 MED ORDER — CHLORHEXIDINE GLUCONATE 0.12 % MT SOLN
15.0000 mL | Freq: Once | OROMUCOSAL | Status: AC
  Administered 2022-04-20: 15 mL via OROMUCOSAL
  Filled 2022-04-20: qty 15

## 2022-04-20 MED ORDER — PANTOPRAZOLE SODIUM 40 MG PO TBEC
40.0000 mg | DELAYED_RELEASE_TABLET | Freq: Every day | ORAL | Status: DC
  Administered 2022-04-21 – 2022-04-22 (×2): 40 mg via ORAL
  Filled 2022-04-20 (×2): qty 1

## 2022-04-20 MED ORDER — GLYCOPYRROLATE 0.2 MG/ML IJ SOLN
INTRAMUSCULAR | Status: DC | PRN
  Administered 2022-04-20 (×2): .1 mg via INTRAVENOUS

## 2022-04-20 MED ORDER — PHENYLEPHRINE HCL-NACL 20-0.9 MG/250ML-% IV SOLN
INTRAVENOUS | Status: DC | PRN
  Administered 2022-04-20: 40 ug/min via INTRAVENOUS
  Administered 2022-04-20: 20 ug/min via INTRAVENOUS
  Administered 2022-04-20: 50 ug/min via INTRAVENOUS

## 2022-04-20 MED ORDER — ONDANSETRON HCL 4 MG/2ML IJ SOLN: 4.0000 mg | Freq: Four times a day (QID) | INTRAMUSCULAR | Status: DC | PRN

## 2022-04-20 MED ORDER — PROMETHAZINE HCL 25 MG/ML IJ SOLN
INTRAMUSCULAR | Status: AC
  Filled 2022-04-20: qty 1

## 2022-04-20 SURGICAL SUPPLY — 63 items
ARTICLIP LAA PROCLIP II 45 (Clip) ×2 IMPLANT
BLADE CLIPPER SURG (BLADE) ×2 IMPLANT
BULB SUCTION JP 400 (SUCTIONS) IMPLANT
CANISTER SUCT 3000ML PPV (MISCELLANEOUS) ×2 IMPLANT
CNTNR URN SCR LID CUP LEK RST (MISCELLANEOUS) ×2 IMPLANT
CONT SPEC 4OZ STRL OR WHT (MISCELLANEOUS) ×2
DERMABOND ADVANCED .7 DNX12 (GAUZE/BANDAGES/DRESSINGS) IMPLANT
DEVICE ATRICLIP LAA PRCLPII 45 (Clip) IMPLANT
DISSECTOR BLUNT TIP ENDO 5MM (MISCELLANEOUS) ×6 IMPLANT
DRAIN CHANNEL 10F 3/8 F FF (DRAIN) ×2 IMPLANT
DRAIN CHANNEL 19F RND (DRAIN) IMPLANT
DRAIN CHANNEL 24FR (DRAIN) IMPLANT
DRAIN CONNECTOR BLAKE 1:1 (MISCELLANEOUS) IMPLANT
DRAPE CV SPLIT W-CLR ANES SCRN (DRAPES) ×2 IMPLANT
DRAPE INCISE IOBAN 66X45 STRL (DRAPES) IMPLANT
DRAPE PERI GROIN 82X75IN TIB (DRAPES) ×2 IMPLANT
ELECT REM PT RETURN 9FT ADLT (ELECTROSURGICAL) ×2
ELECTRODE REM PT RTRN 9FT ADLT (ELECTROSURGICAL) ×2 IMPLANT
GAUZE SPONGE 4X4 12PLY STRL (GAUZE/BANDAGES/DRESSINGS) ×2 IMPLANT
GAUZE SPONGE 4X4 12PLY STRL LF (GAUZE/BANDAGES/DRESSINGS) IMPLANT
GLOVE BIO SURGEON STRL SZ8 (GLOVE) ×4 IMPLANT
GOWN STRL REUS W/ TWL LRG LVL3 (GOWN DISPOSABLE) ×6 IMPLANT
GOWN STRL REUS W/ TWL XL LVL3 (GOWN DISPOSABLE) ×6 IMPLANT
GOWN STRL REUS W/TWL LRG LVL3 (GOWN DISPOSABLE) ×6
GOWN STRL REUS W/TWL XL LVL3 (GOWN DISPOSABLE) ×6
INSERT FOGARTY XLG (MISCELLANEOUS) IMPLANT
INSERT SUTURE HOLDER (MISCELLANEOUS) IMPLANT
IV NS 1000ML (IV SOLUTION) ×2
IV NS 1000ML BAXH (IV SOLUTION) ×2 IMPLANT
IV NS 500ML (IV SOLUTION) ×2
IV NS 500ML BAXH (IV SOLUTION) ×2 IMPLANT
IV SET EXTENSION CATH 6 NF (IV SETS) ×2 IMPLANT
KIT BASIN OR (CUSTOM PROCEDURE TRAY) ×2 IMPLANT
KIT TURNOVER KIT B (KITS) ×2 IMPLANT
LIGASURE LAP MARYLAND 5MM 37CM (ELECTROSURGICAL) ×2 IMPLANT
NS IRRIG 1000ML POUR BTL (IV SOLUTION) ×8 IMPLANT
PACK CHEST (CUSTOM PROCEDURE TRAY) ×2 IMPLANT
PAD ARMBOARD 7.5X6 YLW CONV (MISCELLANEOUS) ×4 IMPLANT
PAD ELECT DEFIB RADIOL ZOLL (MISCELLANEOUS) ×2 IMPLANT
SCISSORS LAP 5X35 DISP (ENDOMECHANICALS) IMPLANT
SEALER LIGASURE MARYLAND 30 (ELECTROSURGICAL) IMPLANT
SUT MNCRL AB 3-0 PS2 27 (SUTURE) ×2 IMPLANT
SUT SILK  1 MH (SUTURE) ×6
SUT SILK 1 MH (SUTURE) ×4 IMPLANT
SUT VIC AB 0 CT1 18XCR BRD 8 (SUTURE) ×2 IMPLANT
SUT VIC AB 0 CT1 8-18 (SUTURE) ×2
SUT VIC AB 1 CTX 18 (SUTURE) IMPLANT
SUT VIC AB 2-0 CT1 27 (SUTURE)
SUT VIC AB 2-0 CT1 TAPERPNT 27 (SUTURE) IMPLANT
SUT VIC AB 2-0 SH 27 (SUTURE) ×2
SUT VIC AB 2-0 SH 27XBRD (SUTURE) IMPLANT
SUT VIC AB 2-0 UR6 27 (SUTURE) IMPLANT
SYR 10ML LL (SYRINGE) IMPLANT
SYSTEM SAHARA CHEST DRAIN ATS (WOUND CARE) IMPLANT
TAPE CLOTH SURG 4X10 WHT LF (GAUZE/BANDAGES/DRESSINGS) IMPLANT
TOWEL GREEN STERILE (TOWEL DISPOSABLE) ×2 IMPLANT
TOWEL GREEN STERILE FF (TOWEL DISPOSABLE) ×2 IMPLANT
TRAY FOLEY SLVR 16FR TEMP STAT (SET/KITS/TRAYS/PACK) ×2 IMPLANT
TROCAR ADV FIXATION 5X100MM (TROCAR) ×4 IMPLANT
TROCAR BALLN 12MMX100 BLUNT (TROCAR) IMPLANT
TUBE CONNECTING 20X1/4 (TUBING) ×4 IMPLANT
TUBING LAP HI FLOW INSUFFLATIO (TUBING) ×2 IMPLANT
WATER STERILE IRR 1000ML POUR (IV SOLUTION) ×4 IMPLANT

## 2022-04-20 NOTE — Anesthesia Procedure Notes (Signed)
Procedure Name: Intubation Date/Time: 04/20/2022 1:41 PM  Performed by: Thelma Comp, CRNAPre-anesthesia Checklist: Patient identified, Emergency Drugs available, Suction available and Patient being monitored Patient Re-evaluated:Patient Re-evaluated prior to induction Oxygen Delivery Method: Circle System Utilized Preoxygenation: Pre-oxygenation with 100% oxygen Induction Type: IV induction Ventilation: Mask ventilation without difficulty Laryngoscope Size: Mac and 4 Grade View: Grade I Tube type: Oral Endobronchial tube: Double lumen EBT, Left, EBT position confirmed by fiberoptic bronchoscope and EBT position confirmed by auscultation and 39 Fr Number of attempts: 1 Airway Equipment and Method: Stylet Placement Confirmation: ETT inserted through vocal cords under direct vision, positive ETCO2 and breath sounds checked- equal and bilateral Tube secured with: Tape Dental Injury: Teeth and Oropharynx as per pre-operative assessment

## 2022-04-20 NOTE — H&P (Signed)
[address]       , [id number]             [place] Record #[id number] Date of Birth: 1935/01/04   Referring: Arnoldo Lenis, MD Primary Care: Redmond School, MD Primary Cardiologist:Branch, Roderic Palau, MD   Chief Complaint:        Chief Complaint  Patient presents with   Atrial Fibrillation      Surgical consult, ECHO 10/15/21/ Cardioversion 12/26/21      History of Present Illness:       Felton Buczynski. is a 88 y.o. male who presents for an evaluation of AF at the request of Dr Harl Bowie and Dr. Quentin Ore.  Medical history includes history includes AF, prostate CA, provoked DVT, HTN, moderate AS and debilitating arthritis on chronic NSAIDS.    He was looked at for [facility] given arthritis requires a lot of NSAIDS. Not a candidate due to severe contrast allergy.    He was cardioverted back in August 2023 for recurrent Aflutter.            Past Medical History:  Diagnosis Date   Arthritis     Atrial fibrillation (HCC)     Auto immune neutropenia (HCC)     Bullous pemphigoid     Cancer (HCC)      Prostate   Cervical spondylosis without myelopathy 30/16/0109   Complication of anesthesia      difficult to wake up after anesthesia   DVT (deep venous thrombosis) ([place])      a. Has had a previous right lower extremity DVT in the setting of hospitalization and surgery (after his brain tumor was resected in 1993) but is no longer on Coumadin.   Dyslipidemia     Dysrhythmia     History of prostate cancer     Hypertension     Meningioma ([place])      a. s/p resection 1990s.   Myasthenia ([place]) 02/14/2013   Nocturnal leg cramps     Ocular myasthenia gravis ([employer])     Peripheral neuropathy 11/23/2018   Seizures ([place])     Sinus bradycardia     Sleep paralysis     Thyroid nodule, cold             Past Surgical History:  Procedure Laterality Date   BLADDER SURGERY         removed part of the bladder   BRAIN SURGERY       CARDIOVERSION N/A 11/07/2020    Procedure: CARDIOVERSION;  Surgeon: Arnoldo Lenis, MD;  Location: [facility] ORS;  Service: Endoscopy;  Laterality: N/A;   CARDIOVERSION N/A 11/20/2021    Procedure: CARDIOVERSION;  Surgeon: Arnoldo Lenis, MD;  Location: [facility] ORS;  Service: Endoscopy;  Laterality: N/A;   CHOLECYSTECTOMY       COLONOSCOPY N/A 07/25/2013    Procedure: COLONOSCOPY;  Surgeon: Jamesetta So, MD;  Location: [facility] ENDO SUITE;  Service: Gastroenterology;  Laterality: N/A;   COLONOSCOPY N/A 11/10/2016    Procedure: COLONOSCOPY;  Surgeon: Aviva Signs, MD;  Location: [facility] ENDO SUITE;  Service: Gastroenterology;  Laterality: N/A;   IR CATHETER TUBE CHANGE   01/02/2017   IR RADIOLOGIST EVAL & MGMT  01/06/2017   IR RADIOLOGIST EVAL & MGMT   01/20/2017   IR RADIOLOGIST EVAL & MGMT   02/03/2017   IR RADIOLOGIST EVAL & MGMT   02/17/2017   IR SINUS/FIST TUBE CHK-NON GI   03/03/2017   PARTIAL COLECTOMY       RADIOACTIVE SEED IMPLANT          Social History        Tobacco Use  Smoking Status Never   Passive exposure: Never  Smokeless Tobacco Never    Social History       Substance and Sexual Activity  Alcohol Use No             Allergies  Allergen Reactions   Iodinated Contrast Media Hives       pt was premedicated/hives reaction occurred 24 hours post injection, Onset Date: [id number]     Sulfa Antibiotics Swelling and Rash   Bactericin [Bacitracin] Other (See Comments)      Unknown reaction             Current Outpatient Medications  Medication Sig Dispense Refill   apixaban (ELIQUIS) 5 MG TABS tablet Take 1 tablet (5 mg total) by mouth 2 (two) times daily. 60 tablet 5   Cholecalciferol (VITAMIN D) 50 MCG (2000 UT) tablet Take 8,000 Units by mouth daily.       clobetasol cream (TEMOVATE) 9.67 % Apply 1 application topically 2 (two) times daily as needed (blisters).       fish oil-omega-3 fatty acids 1000 MG capsule  Take 1 g by mouth 3 (three) times daily.       gabapentin (NEURONTIN) 600 MG tablet Take 1 tablet (600 mg total) by mouth daily. 90 tablet 3   Garlic (GARLIQUE PO) Take 1 tablet by mouth daily.       HYDROcodone-acetaminophen (NORCO/VICODIN) 5-325 MG tablet Take 1 tablet by mouth 2 (two) times daily.       levETIRAcetam (KEPPRA) 500 MG tablet Take 1/2 tablet in the morning, take 1 tablet in the evening 135 tablet 3   losartan (COZAAR) 25 MG tablet Take 1 tablet (25 mg total) by mouth in the morning and at bedtime. 90 tablet 3   magnesium oxide (MAG-OX) 400 (240 Mg) MG tablet Take 400 mg by mouth at bedtime.       niacinamide 500 MG tablet Take 500 mg by mouth 3 (three) times daily.       oxymetazoline (AFRIN) 0.05 % nasal spray Place 1 spray into both nostrils 2 (two) times daily as needed for congestion.       Polyethyl Glycol-Propyl Glycol 0.4-0.3 % SOLN Place 1 drop into both eyes 2 (two) times daily.       predniSONE (DELTASONE) 50 MG tablet For Cardiac CT: Prednisone 50 mg - take 13 hours prior to test Take another Prednisone 50 mg 7 hours prior to test Take another Prednisone 50 mg 1 hour prior to test Take Benadryl 50 mg 1 hour prior to test 3 tablet 0   pyridostigmine (MESTINON) 60 MG tablet Take 90 mg by mouth 3 (three) times daily.       simvastatin (ZOCOR) 20 MG tablet Take 20 mg by mouth at bedtime.       tiZANidine (ZANAFLEX) 4 MG tablet Take 4 mg by mouth at bedtime.        No current facility-administered medications for this visit.      (Not in a hospital admission)  Family History  Problem Relation Age of Onset   Heart failure Mother     Diabetes Mother     Diabetes Sister     Dementia Sister     Lung cancer Son     Heart failure Daughter     Colon cancer Neg Hx          Review of Systems:    ROS Neg 14 pt except as per hpi                Physical Exam: BP (!) 160/71   Pulse (!) 58   Resp 20   Ht '5\' 7"'$  (1.702 m)   Wt 150 lb (68 kg)   SpO2 99%  Comment: RA  BMI 23.49 kg/m    NAD  Appears stated age Clear arthritis in hands Resp nonlaboured Extr wwp   Diagnostic Studies & Laboratory data:     Recent Radiology Findings:    Imaging Results (Last 48 hours)  No results found.     I have independently reviewed the above radiologic studies and discussed with the patient    Recent Lab Findings: Recent Labs       Lab Results  Component Value Date    WBC 6.5 12/26/2021    HGB 13.9 12/26/2021    HCT 40.5 12/26/2021    PLT 182 12/26/2021    GLUCOSE 97 12/26/2021    CHOL 182 10/26/2014    TRIG 130 10/26/2014    HDL 67 10/26/2014    LDLCALC 89 10/26/2014    ALT 17 11/13/2021    AST 20 11/13/2021    NA 137 12/26/2021    K 4.6 12/26/2021    CL 105 12/26/2021    CREATININE 0.83 12/26/2021    BUN 18 12/26/2021    CO2 26 12/26/2021    TSH 1.970 05/05/2021    INR 1.0 10/10/2020    HGBA1C 5.9 (H) 10/25/2014      12/05/20 Nuclear Spect        The study is normal. The study is low risk. There are no perfusion defects consistent with prior infarct or current ischemia.   1.0 mm of horizontal ST depression in the inferior leads (II, III and aVF) was noted.   Defect 1: There is a medium defect with moderate reduction in uptake present in the apical to basal inferior location(s). The defect is most intense in the resting images most consistent with subdiaphragmatic attenuation.   Left ventricular function is normal.        Assessment / Plan:    Avon Mergenthaler. is a 88 y.o. male who presents for an evaluation of LAA clip.  Severe allergy to contrast and thus not a Watchman candidate.    Plan for mini-invasive LAA clip  No sig updates  Plan for OR today

## 2022-04-20 NOTE — Transfer of Care (Signed)
Immediate Anesthesia Transfer of Care Note  Patient: Kyle [name].  Procedure(s) Performed: VIDEO ASSISTED THORACOSCOPY (Left) CLIPPING OF ATRIAL APPENDAGE USING 45 ATRIAL CLIP TRANSESOPHAGEAL ECHOCARDIOGRAM (TEE)  Patient Location: PACU  Anesthesia Type:General  Level of Consciousness: drowsy and patient cooperative  Airway & Oxygen Therapy: Patient Spontanous Breathing and Patient connected to face mask oxygen  Post-op Assessment: Report given to RN and Post -op Vital signs reviewed and stable  Post vital signs: Reviewed and stable  Last Vitals:  Vitals Value Taken Time  BP 158/100 04/20/22 1524  Temp    Pulse 89 04/20/22 1528  Resp 26 04/20/22 1528  SpO2 98 % 04/20/22 1528  Vitals shown include unvalidated device data.  Last Pain:  Vitals:   04/20/22 1142  PainSc: 0-No pain         Complications: No notable events documented.

## 2022-04-20 NOTE — Anesthesia Procedure Notes (Signed)
Arterial Line Insertion Start/End1/11/2022 12:40 PM, 04/20/2022 12:45 PM Performed by: Griffin Dakin, CRNA, CRNA  Patient location: Pre-op. Preanesthetic checklist: patient identified, IV checked, site marked, risks and benefits discussed, surgical consent, monitors and equipment checked, pre-op evaluation, timeout performed and anesthesia consent Lidocaine 1% used for infiltration Right, radial was placed Catheter size: 20 G Hand hygiene performed  and maximum sterile barriers used   Attempts: 1 Procedure performed without using ultrasound guided technique. Following insertion, dressing applied and Biopatch. Post procedure assessment: normal and unchanged  Patient tolerated the procedure well with no immediate complications.

## 2022-04-20 NOTE — Anesthesia Preprocedure Evaluation (Signed)
Anesthesia Evaluation  Patient identified by MRN, date of birth, ID band Patient awake    Reviewed: Allergy & Precautions, NPO status , Patient's Chart, lab work & pertinent test results  Airway Mallampati: II  TM Distance: >3 FB Neck ROM: Full    Dental  (+) Dental Advisory Given   Pulmonary neg pulmonary ROS   breath sounds clear to auscultation       Cardiovascular hypertension, Pt. on medications + dysrhythmias Atrial Fibrillation  Rhythm:Regular Rate:Normal     Neuro/Psych Seizures -,   Neuromuscular disease    GI/Hepatic negative GI ROS, Neg liver ROS,,,  Endo/Other  negative endocrine ROS    Renal/GU Renal disease     Musculoskeletal  (+) Arthritis ,    Abdominal   Peds  Hematology negative hematology ROS (+)   Anesthesia Other Findings   Reproductive/Obstetrics                              Lab Results  Component Value Date   WBC 14.0 (H) 04/16/2022   HGB 12.3 (L) 04/16/2022   HCT 37.6 (L) 04/16/2022   MCV 92.8 04/16/2022   PLT 285 04/16/2022   Lab Results  Component Value Date   CREATININE 1.11 04/16/2022   BUN 32 (H) 04/16/2022   NA 134 (L) 04/16/2022   K 4.1 04/16/2022   CL 102 04/16/2022   CO2 23 04/16/2022    Anesthesia Physical Anesthesia Plan  ASA: 3  Anesthesia Plan: General   Post-op Pain Management: Tylenol PO (pre-op)*   Induction: Intravenous  PONV Risk Score and Plan: 2 and Dexamethasone, Ondansetron and Treatment may vary due to age or medical condition  Airway Management Planned: Double Lumen EBT  Additional Equipment: Arterial line  Intra-op Plan:   Post-operative Plan: Extubation in OR and Possible Post-op intubation/ventilation  Informed Consent: I have reviewed the patients History and Physical, chart, labs and discussed the procedure including the risks, benefits and alternatives for the proposed anesthesia with the patient or  authorized representative who has indicated his/her understanding and acceptance.     Dental advisory given  Plan Discussed with: CRNA  Anesthesia Plan Comments:          Anesthesia Quick Evaluation

## 2022-04-21 ENCOUNTER — Encounter: Payer: Self-pay | Admitting: *Deleted

## 2022-04-21 ENCOUNTER — Inpatient Hospital Stay (HOSPITAL_COMMUNITY): Payer: Medicare Other

## 2022-04-21 LAB — CBC
HCT: 34.3 % — ABNORMAL LOW (ref 39.0–52.0)
Hemoglobin: 11.8 g/dL — ABNORMAL LOW (ref 13.0–17.0)
MCH: 30.9 pg (ref 26.0–34.0)
MCHC: 34.4 g/dL (ref 30.0–36.0)
MCV: 89.8 fL (ref 80.0–100.0)
Platelets: 196 10*3/uL (ref 150–400)
RBC: 3.82 MIL/uL — ABNORMAL LOW (ref 4.22–5.81)
RDW: 12.5 % (ref 11.5–15.5)
WBC: 15.4 10*3/uL — ABNORMAL HIGH (ref 4.0–10.5)
nRBC: 0 % (ref 0.0–0.2)

## 2022-04-21 LAB — BASIC METABOLIC PANEL
Anion gap: 9 (ref 5–15)
BUN: 16 mg/dL (ref 8–23)
CO2: 21 mmol/L — ABNORMAL LOW (ref 22–32)
Calcium: 8.5 mg/dL — ABNORMAL LOW (ref 8.9–10.3)
Chloride: 102 mmol/L (ref 98–111)
Creatinine, Ser: 0.84 mg/dL (ref 0.61–1.24)
GFR, Estimated: >60 mL/min (ref >60)
Glucose, Bld: 155 mg/dL — ABNORMAL HIGH (ref 70–99)
Potassium: 4.4 mmol/L (ref 3.5–5.1)
Sodium: 132 mmol/L — ABNORMAL LOW (ref 135–145)

## 2022-04-21 MED ORDER — LOSARTAN POTASSIUM 50 MG PO TABS
50.0000 mg | ORAL_TABLET | Freq: Two times a day (BID) | ORAL | Status: DC
  Administered 2022-04-21 – 2022-04-22 (×3): 50 mg via ORAL
  Filled 2022-04-21 (×2): qty 1

## 2022-04-21 NOTE — Hospital Course (Signed)
History of Present Illness:       Cohl Behrens. is a 88 y.o. male who presents for an evaluation of AF at the request of Dr Harl Bowie and Dr. Quentin Ore.  Medical history includes history includes AF, prostate CA, provoked DVT, HTN, moderate AS and debilitating arthritis on chronic NSAIDS.    He was looked at for [facility] given arthritis requires a lot of NSAIDS. Not a candidate due to severe contrast allergy.    He was cardioverted back in August 2023 for recurrent Aflutter.    Plan for mini-invasive LAA clip   Hospital Course:  Herbie Baltimore was admitted for elective surgery on 04/20/2022 and taken to the operating room where left video-assisted thoracoscopy was carried out with placement of a left atrial appendage clip.  Following the procedure, he was extubated and recovered in the postanesthesia care unit and later transferred to 4E Progressive Care.  He remained stable in atrial fibrillation with controlled ventricular response.  On the first postoperative day, his Foley catheter was removed and he was mobilized.  Diet was begun advanced and this was well-tolerated.  He was able to void after the Foley catheter was removed.  He regained independence with his mobility.  He was felt to be ready for discharge home on postop day 2.  Lab data in the apixaban since he now has complete isolation of the left atrial appendage.

## 2022-04-21 NOTE — Discharge Instructions (Signed)

## 2022-04-21 NOTE — Progress Notes (Addendum)
[address]       [place] [address]             [phone]      1 Day Post-Op Procedure(s) (LRB): VIDEO ASSISTED THORACOSCOPY (Left) CLIPPING OF ATRIAL APPENDAGE USING 45 ATRIAL CLIP (N/A) TRANSESOPHAGEAL ECHOCARDIOGRAM (TEE) (N/A) Subjective: Awake and alert.  Says he is having some pain at the chest tube insertion site but is otherwise comfortable.  To liquids by mouth. The Foley catheter was removed overnight but he has not yet voided.  He said he is not uncomfortable  Objective: Vital signs in last 24 hours: Temp:  [97.6 F (36.4 C)-98.1 F (36.7 C)] 97.7 F (36.5 C) (01/09 0400) Pulse Rate:  [49-95] 95 (01/09 0600) Cardiac Rhythm: Atrial fibrillation;Bundle branch block (01/08 1952) Resp:  [12-25] 15 (01/09 0600) BP: (107-165)/(69-106) 124/76 (01/09 0600) SpO2:  [95 %-99 %] 97 % (01/09 0600) Arterial Line BP: (175-181)/(67-76) 179/74 (01/08 1705) Weight:  [68 kg] 68 kg (01/08 1048)     Intake/Output from previous day: 01/08 0701 - 01/09 0700 In: 2135.1 [P.O.:750; I.V.:1185; IV Piggyback:200.1] Out: 1980 [Urine:1880; Blood:10; Chest Tube:90] Intake/Output this shift: No intake/output data recorded.  General appearance: alert, cooperative, and no distress Neurologic: intact Heart: In atrial fibrillation with controlled ventricular response most of the night.  He was in sinus rhythm at the time of this visit. Lungs: Breath sounds are clear to auscultation.  Chest x-ray satisfactory minimal drainage from the left pleural tube Extremities: No edema Wound: The left subclavian port incision is intact and dry, covered with Dermabond.  Chest tube site and lateral port incision are also dry.  Lab Results: Recent Labs    04/20/22 1401 04/21/22 0612  WBC  --  15.4*  HGB 10.2* 11.8*  HCT 30.0* 34.3*  PLT  --  196   BMET:  Recent Labs    04/20/22 1401 04/21/22 0612  NA 139 132*  K 3.8 4.4  CL 109 102  CO2  --  21*  GLUCOSE 123* 155*   BUN 20 16  CREATININE 0.90 0.84  CALCIUM  --  8.5*    PT/INR: No results for input(s): "LABPROT", "INR" in the last 72 hours. ABG    Component Value Date/Time   PHART 7.387 04/20/2022 1358   HCO3 21.4 04/20/2022 1358   TCO2 21 (L) 04/20/2022 1401   ACIDBASEDEF 3.0 (H) 04/20/2022 1358   O2SAT 100 04/20/2022 1358   CBG (last 3)  No results for input(s): "GLUCAP" in the last 72 hours.  Assessment/Plan: S/P Procedure(s) (LRB): VIDEO ASSISTED THORACOSCOPY (Left) CLIPPING OF ATRIAL APPENDAGE USING 45 ATRIAL CLIP (N/A) TRANSESOPHAGEAL ECHOCARDIOGRAM (TEE) (N/A)  -Postop day 1 left video-assisted thoracoscopy with clipping of the left atrial appendage for persistent atrial fibrillation.  He is currently in sinus rhythm although he was in atrial fibrillation most of the night last night.  Amiodarone has been resumed.  We will plan to resume his anticoagulation as well.  Remove the pleural tube today and mobilize.  -Postop urinary retention-he does have a history of partial bladder resection but no history of prostate surgery.  He is not uncomfortable so we will continue to observe for now.  -History of seizure disorder-Keppra has been resumed  -History of hypertension-control acceptable, his losartan has been resumed    LOS: 1 day    Antony Odea, PA-C [phone] 04/21/2022  DR Tenny Craw ADDENDUM  Does not need to restart therapeutic anticoagulation (as LAA is clipped).  SQH while inpatient.  OK to start high dose meds for arthritis later this week.   Would advise increase to high dose arthritis meds at conclusion of these 2 weeks when colchicine off.

## 2022-04-21 NOTE — Discharge Summary (Signed)
Physician Discharge Summary  Patient ID: Kyle [name]. MRN: [id number] DOB/AGE: [birth year] 88 y.o.  Admit date: 04/20/2022 Discharge date: 05/05/2022  Admission Diagnoses:  Persistent atrial fibrillation Seizure disorder History of thrombocytopenia History of cervical spondylosis History of hypertension Peripheral neuropathy Moderate aortic stenosis Lumbar spondylosis and spinal stenosis  Discharge Diagnoses:   Persistent atrial fibrillation Status post left video-assisted thoracoscopy for placement of  left atrial   appendage clip Seizure disorder History of thrombocytopenia History of cervical spondylosis History of hypertension Peripheral neuropathy Moderate aortic stenosis Lumbar spondylosis and spinal stenosis   Discharged Condition: stable  History of Present Illness:       Kyle [name]. is a 88 y.o. male who presents for an evaluation of AF at the request of Dr Harl Bowie and Dr. Quentin Ore.  Medical history includes history includes AF, prostate CA, provoked DVT, HTN, moderate AS and debilitating arthritis on chronic NSAIDS.    He was looked at for [facility] given arthritis requires a lot of NSAIDS. Not a candidate due to severe contrast allergy.    He was cardioverted back in August 2023 for recurrent Aflutter.    Plan for mini-invasive LAA clip   Hospital Course:  Herbie Baltimore was admitted for elective surgery on 04/20/2022 and taken to the operating room where left video-assisted thoracoscopy was carried out with placement of a left atrial appendage clip.  Following the procedure, he was extubated and recovered in the postanesthesia care unit and later transferred to 4E Progressive Care.  He remained stable in atrial fibrillation with controlled ventricular response.  On the first postoperative day, his Foley catheter was removed and he was mobilized.  Diet was begun advanced and this was well-tolerated.  He was able to void after the Foley catheter was removed.   He regained independence with his mobility.  He was felt to be ready for discharge home on postop day 2. He will not be restarted on  the apixaban since he now has complete isolation of the left atrial appendage.  Consults: None  Significant Diagnostic Studies:   CLINICAL DATA:  Atrial fibrillation. History of clipping of the atrial appendage.   EXAM: CHEST - 2 VIEW   COMPARISON:  04/22/2022   FINDINGS: Unchanged left atrial appendage clip. Clear lungs. Stable heart size and mediastinal contours. No pleural effusion or pneumothorax. Demineralized appearance of the thoracic spine limits evaluation. Cholecystectomy clips project over the right upper quadrant.   IMPRESSION: No acute cardiopulmonary disease.     Electronically Signed   By: Emmit Alexanders M.D.   On: 04/30/2022 14:36  Treatments: Surgery   OPERATIVE NOTE: Patient Name: Kyle [name]. Date of Birth: 1934-06-17 Date of Operation: 04/20/22   PRE-OPERATIVE DIAGNOSIS: Atrial fibrillation Provoked DVT, 1993 History of prostate cancer HTN Past meningioma   POST-OPERATIVE DIAGNOSIS: Same   OPERATION: Closure of left atrial appendage, thoracoscopic   SURGEON: Pierre Bali Enter MD   ASSISTANT: Enid Cutter, PA   EBL 10cc   FINDINGS: Large left atrial appendage fully closed by TEE.    No evidence of residual appendage.   SPECIMENS None   COMPLICATIONS: None    Discharge Exam: Blood pressure 112/76, pulse (!) 107, temperature 98 F (36.7 C), temperature source Oral, resp. rate 16, height '5\' 8"'$  (1.727 m), weight 68 kg, SpO2 96 %.   General appearance: alert, cooperative, and no distress Neurologic: intact Heart: In atrial fibrillation with controlled ventricular response.  Lungs: Breath sounds are clear to auscultation.  Chest x-ray  satisfactory with no residual effusion.  Extremities: No edema Wound: The left subclavian port incision is intact and dry, covered with Dermabond.  Chest  tube site and lateral port incision are also dry.  Disposition: Discharge disposition: 01-Home or Self Care      Discharged to home in stable condition  Allergies as of 04/22/2022       Reactions   Iodinated Contrast Media Hives    pt was premedicated/hives reaction occurred 24 hours post injection, Onset Date: [id number]   Sulfa Antibiotics Hives, Swelling, Rash   Bactericin [bacitracin] Other (See Comments)   Unknown reaction         Medication List     STOP taking these medications    apixaban 5 MG Tabs tablet Commonly known as: ELIQUIS   predniSONE 50 MG tablet Commonly known as: DELTASONE       TAKE these medications    cholecalciferol 25 MCG (1000 UNIT) tablet Commonly known as: VITAMIN D3 Take 1,000 Units by mouth daily.   clobetasol cream 0.05 % Commonly known as: TEMOVATE Apply 1 application  topically 2 (two) times daily as needed (blisters).   clobetasol 0.05 % external solution Commonly known as: TEMOVATE Apply 1 Application topically 2 (two) times daily as needed (blisters).   fish oil-omega-3 fatty acids 1000 MG capsule Take 1 g by mouth 4 (four) times daily.   gabapentin 600 MG tablet Commonly known as: NEURONTIN Take 1 tablet (600 mg total) by mouth daily. What changed: when to take this   GARLIQUE PO Take 1 tablet by mouth daily.   HYDROcodone-acetaminophen 5-325 MG tablet Commonly known as: NORCO/VICODIN Take 1 tablet by mouth 2 (two) times daily.   levETIRAcetam 500 MG tablet Commonly known as: KEPPRA Take 1/2 tablet in the morning, take 1 tablet in the evening   losartan 50 MG tablet Commonly known as: COZAAR Take 1 tablet (50 mg total) by mouth in the morning and at bedtime.   niacinamide 500 MG tablet Take 500 mg by mouth 3 (three) times daily.   oxymetazoline 0.05 % nasal spray Commonly known as: AFRIN Place 1 spray into both nostrils 2 (two) times daily as needed for congestion.   Polyethyl Glycol-Propyl Glycol  0.4-0.3 % Soln Place 1 drop into both eyes 3 (three) times daily.   pyridostigmine 60 MG tablet Commonly known as: MESTINON Take 90 mg by mouth 3 (three) times daily.   simvastatin 20 MG tablet Commonly known as: ZOCOR Take 20 mg by mouth at bedtime.   tiZANidine 4 MG tablet Commonly known as: ZANAFLEX Take 4 mg by mouth at bedtime.        Follow-up Information     Evans Lance, MD. Go on 05/14/2022.   Specialty: Cardiology Why: Your appointment is at 8:30 AM Contact information: [address] [place] [address] [phone]         Enter, Pierre Bali, MD. Go on 04/30/2022.   Specialties: Cardiothoracic Surgery, Cardiology Why: Your appointment is at 3:30pm Contact information: [place] [address] [phone]                   Signed: Antony Odea 05/05/2022, 11:49 AM

## 2022-04-21 NOTE — Progress Notes (Addendum)
D/c  chest tube per order. Pt was still hooked up to -20 cm suction. Confirmed with PA to proceed with chest tube removal. Pulled chest tube with 2nd RN (charge nurse). Applied Vaseline, 4x4 gauzes and ABD. Pt educated on bed rest.  Pt tolerated fair. Will continue to monitor the pt.  Kyle Atlas, RN

## 2022-04-22 ENCOUNTER — Inpatient Hospital Stay (HOSPITAL_COMMUNITY): Payer: Medicare Other

## 2022-04-22 ENCOUNTER — Encounter (HOSPITAL_COMMUNITY): Payer: Self-pay | Admitting: Cardiothoracic Surgery

## 2022-04-22 LAB — CBC
HCT: 35.7 % — ABNORMAL LOW (ref 39.0–52.0)
Hemoglobin: 12.3 g/dL — ABNORMAL LOW (ref 13.0–17.0)
MCH: 31.4 pg (ref 26.0–34.0)
MCHC: 34.5 g/dL (ref 30.0–36.0)
MCV: 91.1 fL (ref 80.0–100.0)
Platelets: 198 10*3/uL (ref 150–400)
RBC: 3.92 MIL/uL — ABNORMAL LOW (ref 4.22–5.81)
RDW: 13 % (ref 11.5–15.5)
WBC: 16.9 10*3/uL — ABNORMAL HIGH (ref 4.0–10.5)
nRBC: 0 % (ref 0.0–0.2)

## 2022-04-22 LAB — COMPREHENSIVE METABOLIC PANEL
ALT: 12 U/L (ref 0–44)
AST: 23 U/L (ref 15–41)
Albumin: 2.9 g/dL — ABNORMAL LOW (ref 3.5–5.0)
Alkaline Phosphatase: 46 U/L (ref 38–126)
Anion gap: 6 (ref 5–15)
BUN: 22 mg/dL (ref 8–23)
CO2: 25 mmol/L (ref 22–32)
Calcium: 8.6 mg/dL — ABNORMAL LOW (ref 8.9–10.3)
Chloride: 104 mmol/L (ref 98–111)
Creatinine, Ser: 0.98 mg/dL (ref 0.61–1.24)
GFR, Estimated: >60 mL/min (ref >60)
Glucose, Bld: 128 mg/dL — ABNORMAL HIGH (ref 70–99)
Potassium: 4.3 mmol/L (ref 3.5–5.1)
Sodium: 135 mmol/L (ref 135–145)
Total Bilirubin: 0.5 mg/dL (ref 0.3–1.2)
Total Protein: 6.1 g/dL — ABNORMAL LOW (ref 6.5–8.1)

## 2022-04-22 NOTE — Progress Notes (Addendum)
      [address]       [place] [address]             [phone]      2 Days Post-Op Procedure(s) (LRB): VIDEO ASSISTED THORACOSCOPY (Left) CLIPPING OF ATRIAL APPENDAGE USING 45 ATRIAL CLIP (N/A) TRANSESOPHAGEAL ECHOCARDIOGRAM (TEE) (N/A) Subjective:  Awake and alert.  Says incisional pain has gradually diminished and he is comfortable. He has been able to void without difficulty and had a BM early this morning.  No new concerns.   Objective: Vital signs in last 24 hours: Temp:  [97.5 F (36.4 C)-98.3 F (36.8 C)] 97.5 F (36.4 C) (01/10 0349) Pulse Rate:  [92-102] 94 (01/10 0349) Cardiac Rhythm: Atrial fibrillation (01/09 1919) Resp:  [16-20] 16 (01/10 0349) BP: (94-117)/(68-82) 94/68 (01/10 0349) SpO2:  [96 %-98 %] 98 % (01/10 0349)     Intake/Output from previous day: 01/09 0701 - 01/10 0700 In: 250 [P.O.:250] Out: 30 [Chest Tube:30] Intake/Output this shift: No intake/output data recorded.  General appearance: alert, cooperative, and no distress Neurologic: intact Heart: In atrial fibrillation with controlled ventricular response.  Lungs: Breath sounds are clear to auscultation.  Chest x-ray satisfactory with no residual effusion.  Extremities: No edema Wound: The left subclavian port incision is intact and dry, covered with Dermabond.  Chest tube site and lateral port incision are also dry.  Lab Results: Recent Labs    04/21/22 0612 04/22/22 0052  WBC 15.4* 16.9*  HGB 11.8* 12.3*  HCT 34.3* 35.7*  PLT 196 198    BMET:  Recent Labs    04/21/22 0612 04/22/22 0052  NA 132* 135  K 4.4 4.3  CL 102 104  CO2 21* 25  GLUCOSE 155* 128*  BUN 16 22  CREATININE 0.84 0.98  CALCIUM 8.5* 8.6*     PT/INR: No results for input(s): "LABPROT", "INR" in the last 72 hours. ABG    Component Value Date/Time   PHART 7.387 04/20/2022 1358   HCO3 21.4 04/20/2022 1358   TCO2 21 (L) 04/20/2022 1401   ACIDBASEDEF 3.0 (H) 04/20/2022 1358    O2SAT 100 04/20/2022 1358   CBG (last 3)  No results for input(s): "GLUCAP" in the last 72 hours.  Assessment/Plan: S/P Procedure(s) (LRB): VIDEO ASSISTED THORACOSCOPY (Left) CLIPPING OF ATRIAL APPENDAGE USING 45 ATRIAL CLIP (N/A) TRANSESOPHAGEAL ECHOCARDIOGRAM (TEE) (N/A)  -Postop day 2 left video-assisted thoracoscopy with clipping of the left atrial appendage for persistent atrial fibrillation.  He is in atrial fibrillation with CVR. Amiodarone has been resumed.  We will plan to resume, no plans to re-start   -Postop urinary retention-resolved.  -History of seizure disorder-Keppra has been resumed  -History of hypertension-BP relatively low this morning. Will hold the losartan today if trend continues.   -Leukocytosis- no fever and no clinical signs of infection.  - Plan to discharge  to home today.    LOS: 2 days    Antony Odea, PA-C [phone] 04/22/2022  .  DR Tenny Craw ADDENDUM  Does not need Eliquis or colchicine Can go home today.   Looks well.

## 2022-04-22 NOTE — Telephone Encounter (Signed)
Bp still too high, please add norvasc '5mg'$  daily and update [place] 1 week  Zandra Abts MD

## 2022-04-22 NOTE — Congregational Nurse Program (Signed)
D/c tele and Ivs. Went over AVS with pt and all questions were addressed.  Janilah Hojnacki S Minnie Legros, RN  

## 2022-04-22 NOTE — Anesthesia Postprocedure Evaluation (Signed)
Anesthesia Post Note  Patient: Kyle [name].  Procedure(s) Performed: VIDEO ASSISTED THORACOSCOPY (Left) CLIPPING OF ATRIAL APPENDAGE USING 45 ATRIAL CLIP TRANSESOPHAGEAL ECHOCARDIOGRAM (TEE)     Patient location during evaluation: PACU Anesthesia Type: General Level of consciousness: awake and alert Pain management: pain level controlled Vital Signs Assessment: post-procedure vital signs reviewed and stable Respiratory status: spontaneous breathing, nonlabored ventilation, respiratory function stable and patient connected to nasal cannula oxygen Cardiovascular status: blood pressure returned to baseline and stable Postop Assessment: no apparent nausea or vomiting Anesthetic complications: no   No notable events documented.  Last Vitals:  Vitals:   04/22/22 0349 04/22/22 0921  BP: 94/68 112/76  Pulse: 94 (!) 107  Resp: 16 16  Temp: (!) 36.4 C 36.7 C  SpO2: 98% 96%    Last Pain:  Vitals:   04/22/22 0921  TempSrc: Oral  PainSc:                  [name]

## 2022-04-22 NOTE — TOC Transition Note (Signed)
Transition of Care (TOC) - CM/SW Discharge Note Marvetta Gibbons RN, BSN Transitions of Care Unit 4E- RN Case Manager See Treatment Team for direct phone #    Patient Details  Name: Kyle [name]. MRN: [id number] Date of Birth: September 21, 1934  Transition of Care [facility]) CM/SW Contact:  Dawayne Patricia, RN Phone Number: 04/22/2022, 10:59 AM   Clinical Narrative:    Pt stable for transition home today, Transition of Care Department [facility]) has reviewed patient and no TOC needs have been identified at this time. Pt has transportation home and follow up appointments in place.    Final next level of care: Home/Self Care Barriers to Discharge: No Barriers Identified   Patient Goals and CMS Choice   Choice offered to / list presented to : NA  Discharge Placement                 Home        Discharge Plan and Services Additional resources added to the After Visit Summary for                  DME Arranged: N/A DME Agency: NA       HH Arranged: NA HH Agency: NA        Social Determinants of Health (SDOH) Interventions SDOH Screenings   Food Insecurity: No Food Insecurity (04/20/2022)  Housing: Low Risk  (04/20/2022)  Transportation Needs: No Transportation Needs (04/20/2022)  Utilities: Not At Risk (04/20/2022)  Financial Resource Strain: Low Risk  (03/25/2022)  Tobacco Use: Low Risk  (04/20/2022)     Readmission Risk Interventions    04/22/2022   10:59 AM  Readmission Risk Prevention Plan  Transportation Screening Complete  PCP or Specialist Appt within 5-7 Days Complete  Home Care Screening Complete  Medication Review (RN CM) Complete

## 2022-04-22 NOTE — Progress Notes (Signed)
Pt is stable hemodynamically, afebrile,EKG is A- fib on the monitor, HR 70s-90s. Respiration is normal effort on room air, SPO2 96-98%.  He is able to ambulate with walker. Pian is well tolerated. Tylenol given for pain per schedule.   He has moderate serosanguinous drainage from the old chest tube incision site after chest tube was removed yesterday. Dressing was socking wet. We changed it twice.   Pt had some concern about night sweat that started yesterday. No acute distress noted. We will hand off to the morning round.   Kennyth Lose, RN

## 2022-04-23 LAB — ECHO INTRAOPERATIVE TEE
AR max vel: 1.03 cm2
AV Area VTI: 1.12 cm2
AV Area mean vel: 1.07 cm2
AV Mean grad: 16 mmHg
AV Peak grad: 25.8 mmHg
Ao pk vel: 2.54 m/s
Height: 68 in
Weight: 2400 oz

## 2022-04-23 NOTE — Telephone Encounter (Signed)
I spoke with patient and wife on phone. They now report BP's 107/64, 129/90. They want to watch BP for now and call back in a week with moe readings.

## 2022-04-24 ENCOUNTER — Telehealth: Payer: Self-pay | Admitting: Neurology

## 2022-04-24 ENCOUNTER — Ambulatory Visit: Payer: Self-pay | Admitting: *Deleted

## 2022-04-24 ENCOUNTER — Encounter: Payer: Self-pay | Admitting: *Deleted

## 2022-04-24 NOTE — Telephone Encounter (Signed)
Pt called stating that he had a heart procedure and was taken off of the blood thinners they had put him on. He states that his cardiologist gave the ok to get back on his diclofenac (VOLTAREN) 75 MG EC tablet and he would like the refill sent to [facility] for a 90 day supply. Please advise.

## 2022-04-24 NOTE — Patient Outreach (Signed)
  Care Coordination   Follow Up Visit Note   04/24/2022 Name: Kyle [name]. MRN: [id number] DOB: October 12, 1934  Kyle [name]. is a 88 y.o. year old male who sees Redmond School, MD for primary care. I  spoke with Mr Divita and his daughter, Kyle [name], via WebEx today. At hospital discharge he was advised to follow-up with me in our [facility] office in [place]. Unfortunately, they went to the office for the follow-up appointment when it was scheduled for a telephone appointment. We were able to accommodate them with a WebEx visit and apologized for the confusion and any trouble it may have caused. Both patient and daughter seemed pleased with the visit.    What matters to the patients health and wellness today?  Restarting diclofenac for arthritis pain    Goals Addressed             This Visit's Progress    Management of AFib       Care Coordination Interventions: Assessed social determinant of health barriers Reviewed hospital notes and discussed recent LAA procedure on 04/20/22. Patient feels well overall but has had some new onset tremors, which may be related to the pain medication he was given.  Will f/u with provider if they persist.  He was not a candidate for the Watchmen device due to contrast dye allergy Reviewed and discussed medications.  Has been off of Eliquis since 04/15/22.  Has been off of Diclofenac '75mg'$  BID since starting Eliquis and was cleared by cardio to restart Eliquis Has a call into neurologist who was prescribing this, but they can't address the Rx until next week I reached out to Dr Delanna Ahmadi office and requested a script for diclofenac '75mg'$  one twice daily #60 be sent to [facility] in [place]. They will contact patient directly with wether they can or can't refill this for him. Explained that it really helps with his arthritic joint pain and he is anxious to restart it.  Talked with patient again and he is going to return call to Dr Delanna Ahmadi office and let me know  if they are not going to be able to fill this for him. If not, I will reach back out to neuro to see if someone is willing to take care of it for him today Falls assessment performed  No falls since last visit.  Has a rolling walker but does not use it consistently. Discussed benefits and encouraged to use, especially for long distance walking Reviewed upcoming appt with Dr Tenny Craw for 04/30/22 at 3:30. Verified transportation.  Provided with [facility] telephone number and encouraged to reach out as needed        SDOH assessments and interventions completed:  Yes SDOH Interventions Today    Flowsheet Row Most Recent Value  SDOH Interventions   Transportation Interventions Intervention Not Indicated  Financial Strain Interventions Intervention Not Indicated        Care Coordination Interventions:  Yes, provided   Follow up plan:  Awaiting return call from [facility] and will follow-up with patient regarding medication refill.     Encounter Outcome:  Pt. Visit Completed   Chong Sicilian, BSN, RN-BC RN Care Coordinator [place] Direct Dial: [phone] Main #: [phone]

## 2022-04-26 NOTE — Progress Notes (Signed)
[address]       [place] [address]             Treutlen Ports Jr. Lydia Record #[id number] Date of Birth: [birth year]  Referring: Redmond School, MD Primary Care: Redmond School, MD Primary Cardiologist: Carlyle Dolly, MD  S/P VATS LAA clip on 04/20/22 Doing great at home.  Walking fine Wants to shower.  PHYSICAL EXAMINATION: There were no vitals taken for this visit.  Gen: NAD Neuro: Alert and oriented Chest: incisions healing well Resp: Nonlaboured Abd: Soft, ntnd Extr: WWP, no sig LE edema bilateral  Diagnostic Studies & Laboratory data:    XR today: Clear lung fields today  Assessment / Plan:   S/P VATS LAA clip on 04/20/22  Incisions healing well He is off Eliquis RTC PRN - no further followup needed.   Pierre Bali Addison Whidbee 04/26/2022 4:15 PM

## 2022-04-26 NOTE — Op Note (Addendum)
OPERATIVE NOTE: Patient Name: Kyle [name]. Date of Birth: [birth year] Date of Operation: 04/20/22  PRE-OPERATIVE DIAGNOSIS: Atrial fibrillation Provoked DVT, 1993 History of prostate cancer HTN Past meningioma  POST-OPERATIVE DIAGNOSIS: Same  OPERATION: Closure of left atrial appendage, thoracoscopic  SURGEON: Pierre Bali Oreta Soloway MD   ASSISTANT: Enid Cutter, PA  EBL 10cc  FINDINGS: Large left atrial appendage fully closed by TEE.   No evidence of residual appendage.   SPECIMENS None   COMPLICATIONS: None  TUBES:  1 24 Fr blake (left pleural)  INDICATIONS: Kyle [name]. is a 88 y.o. male who presents for an evaluation of LAA clip.  Severe allergy to contrast and thus not a Watchman candidate. Had a nuclear spect back in August 2022 which was low risk.  Risks/benefits/alternatives were discussed at length (98% straightforward recovery, 2% morbidity [any organ, predominantly age-related issues I.e. tissue quality, <<1% mortality]. Straightforward recovery typically entails 1-2 days on the floor, seeing me back in clinic at 1 week. Total recovery expected by 3 weeks. All questions were asked and answered.     PROCEDURE IN DETAIL: The patient was brought in the operating room and laid in supine position. A TEE probe was placed and it was confirmed there was not clot in the left atrial appendage.    The patient was prepped and draped in standard fashion.   An arterial and venous line were placed by anesthesia along with a double-lumen endotracheal tube. An inflatable bump placed under left side and patient was prepped with bump in place.    Next, attention was turned to management of the left atrial appendage. I ensured the left lung was deflated and had been suctioned. . A 27m 30 degree camera was placed into the 2nd port and I viewed the pleural space after giving local analgesia at level of the skin. Following this, I placed local analgesia in the interspaces  and skin for a 6th and 4th interspace port. Then, I placed a 537mport in the 4th space after a 1226mort was placed in the 6-7th space. Next, Ligasure was used to make a pericardial window staying well away from the phrenic nerve at least 1cm. Next, I dissected out the left atrial appendage. This was a large appendage. I placed an atriclip over the left atrial appendage and there was no residual appendage per TEE.  I then placed a 24 Fr chest tube and closed all incisions with 2 layers of absorbable sutures and dermabond.

## 2022-04-27 ENCOUNTER — Ambulatory Visit: Payer: Self-pay | Admitting: *Deleted

## 2022-04-27 NOTE — Patient Outreach (Signed)
  Care Coordination   04/27/2022 Name: Kyle [name]. MRN: [id number] DOB: 1934-12-11   Care Coordination Outreach Attempts:  Unsuccessful outreach today to follow-up on appt on 04/24/22.   Follow Up Plan:  Telephone appt scheduled for 06/09/22 at 1:00  Encounter Outcome:  Pt. Scheduled   Care Coordination Interventions:  Yes, provided     Goals Addressed             This Visit's Progress    COMPLETED: Obtain Diclofenac to Manage Arthritis       Care Coordination Interventions: Collaborated with PCP office regarding prescription for diclofenac. Patient is no longer taking blood thinner and was cleared to restart NSAID for arthritis pain management. He was eager to do so.  Verified that diclofenac '75mg'$  one tablet twice daily was sent to [facility] medication list Left HIPAA compliant voicemail requesting that patient return call if needed Left telephone follow-up appointment info on voicemail: 06/09/22 at 1:00        Chong Sicilian, BSN, RN-BC [place] Direct Dial: [phone] Main #: [phone]

## 2022-04-27 NOTE — Telephone Encounter (Signed)
Pt called back and said his PCP refill medication diclofenac (VOLTAREN) 75 MG EC tablet.

## 2022-04-29 ENCOUNTER — Other Ambulatory Visit: Payer: Self-pay | Admitting: Cardiothoracic Surgery

## 2022-04-29 ENCOUNTER — Telehealth: Payer: Self-pay | Admitting: Cardiology

## 2022-04-29 ENCOUNTER — Ambulatory Visit: Payer: Medicare Other | Admitting: Internal Medicine

## 2022-04-29 DIAGNOSIS — I4891 Unspecified atrial fibrillation: Secondary | ICD-10-CM

## 2022-04-29 NOTE — Telephone Encounter (Signed)
Patient states he is returning a call, but does not know who called.

## 2022-04-30 ENCOUNTER — Ambulatory Visit (INDEPENDENT_AMBULATORY_CARE_PROVIDER_SITE_OTHER): Payer: Self-pay | Admitting: Cardiothoracic Surgery

## 2022-04-30 ENCOUNTER — Ambulatory Visit
Admission: RE | Admit: 2022-04-30 | Discharge: 2022-04-30 | Disposition: A | Discharge status: Home / Self Care | Payer: Medicare Other | Source: Ambulatory Visit | Attending: Cardiothoracic Surgery | Admitting: Cardiothoracic Surgery

## 2022-04-30 ENCOUNTER — Telehealth: Payer: Self-pay | Admitting: Cardiology

## 2022-04-30 ENCOUNTER — Encounter: Payer: Self-pay | Admitting: Cardiology

## 2022-04-30 VITALS — BP 170/67 | HR 72 | Resp 20 | Wt 156.0 lb

## 2022-04-30 DIAGNOSIS — I4819 Other persistent atrial fibrillation: Secondary | ICD-10-CM

## 2022-04-30 DIAGNOSIS — Z09 Encounter for follow-up examination after completed treatment for conditions other than malignant neoplasm: Secondary | ICD-10-CM

## 2022-04-30 DIAGNOSIS — I4891 Unspecified atrial fibrillation: Secondary | ICD-10-CM

## 2022-04-30 DIAGNOSIS — Z9049 Acquired absence of other specified parts of digestive tract: Secondary | ICD-10-CM | POA: Diagnosis not present

## 2022-04-30 MED ORDER — AMIODARONE HCL 200 MG PO TABS: 200.0000 mg | ORAL_TABLET | Freq: Every day | ORAL | 3 refills | Status: DC

## 2022-04-30 MED ORDER — AMLODIPINE BESYLATE 2.5 MG PO TABS: 2.5000 mg | ORAL_TABLET | Freq: Every day | ORAL | 3 refills | Status: DC

## 2022-04-30 NOTE — Telephone Encounter (Signed)
Pt came in office and stated that his RX for amiodarone 200 mg will run out before his next appt with Dr. Harl Bowie. I have uploaded his BP log. He is wanting to know if he should continue taking the amiodarone and if so he will need it refilled before his appt.

## 2022-04-30 NOTE — Telephone Encounter (Signed)
Continue amiodarone. BP's too high, please start norvasc 2.'5mg'$  daily  Zandra Abts MD

## 2022-04-30 NOTE — Telephone Encounter (Signed)
Amiodarone refill sent to [facility]. Will route to provider for BP log.

## 2022-04-30 NOTE — Telephone Encounter (Signed)
Patients spouse notified and verbalized understanding.  

## 2022-05-01 NOTE — Telephone Encounter (Signed)
Patient wants to know if Dr. Harl Bowie has decided on his BP medicine (Losartan) as his BP has not come down.

## 2022-05-01 NOTE — Telephone Encounter (Signed)
Spoke to pt- verbalized providers recommendations. Patient had no further questions or concerns at this time.

## 2022-05-06 ENCOUNTER — Other Ambulatory Visit: Payer: Self-pay | Admitting: "Endocrinology

## 2022-05-06 DIAGNOSIS — E042 Nontoxic multinodular goiter: Secondary | ICD-10-CM | POA: Diagnosis not present

## 2022-05-07 LAB — T3, FREE: T3, Free: 2.4 pg/mL (ref 2.3–4.2)

## 2022-05-07 LAB — T4, FREE: Free T4: 1.4 ng/dL (ref 0.8–1.8)

## 2022-05-07 LAB — TSH: TSH: 1.07 mIU/L (ref 0.40–4.50)

## 2022-05-12 ENCOUNTER — Encounter: Payer: Self-pay | Admitting: *Deleted

## 2022-05-12 ENCOUNTER — Ambulatory Visit: Payer: Medicare Other | Attending: Cardiology | Admitting: Cardiology

## 2022-05-12 VITALS — BP 152/62 | HR 59 | Ht 67.5 in | Wt 156.0 lb

## 2022-05-12 DIAGNOSIS — I4892 Unspecified atrial flutter: Secondary | ICD-10-CM | POA: Diagnosis not present

## 2022-05-12 DIAGNOSIS — I10 Essential (primary) hypertension: Secondary | ICD-10-CM | POA: Diagnosis not present

## 2022-05-12 DIAGNOSIS — Z79899 Other long term (current) drug therapy: Secondary | ICD-10-CM | POA: Diagnosis not present

## 2022-05-12 MED ORDER — AMIODARONE HCL 200 MG PO TABS: 200.0000 mg | ORAL_TABLET | Freq: Every day | ORAL | 3 refills | Status: DC

## 2022-05-12 MED ORDER — CHLORTHALIDONE 25 MG PO TABS: 12.5000 mg | ORAL_TABLET | Freq: Every day | ORAL | 6 refills | Status: DC

## 2022-05-12 NOTE — Progress Notes (Signed)
Clinical Summary Kyle [name] is a 88 y.o.male seen today for follow up of the following medical problems.   1.Atrial flutter - 11/07/20 DCCV. HRs low after conversion, diltiazem was stopped and continued on toprol '25mg'$ . Toprol lowered to 12.'5mg'$  daily after ongoing bradycardia and then later stopped   -recurrent aflutter at 11/13/21 appt,  - 11/20/21 succesful DCCV to SR. Low HRs after conversion, toprol was stopped - 12/24/21 nursing visit EKG back in aflutter rate 93 -12/26/21 ER visit DCCV again to [place] in ER 03/01/22 - amio started 03/03/22 - has maintained SR since then, no recent symptoms   - evaluated for watchman, not able to proceed due to severe contrast allergy  -Dr Quentin Ore had suggested if patient interested referring him to Dr. Tenny Craw to consider thoracoscopic, off-pump left atrial appendage occlusion/clipping.  Patient does have interest, debilitating arthritis unable to take NSAIDs on eliquis.   -s/p thorascopic clouse left atrial appendage by Dr Tenny Craw 04/20/22. Has done well           2.History of chest pain Lexiscan normal 11/25/20 - no recent symptoms       3,. HTN - he is compliant with meds - recently started norvasc 2.'5mg'$  daily.  - home bp's 130s-150s/70s - recent leg swelling since starting norvasc. He says he thinks years ago was tried on a medication for bp that also causes swelling but not sure of the name.    4. Aortic stenosis - 09/2020 echo LVEF 65-70%, AV mean grad 10 DI 0.44 AVA VTI 1.39 SVI 32  10/2021 echo LVEF 70-75%, grade II dd, mod AS mean grad 24 DI 0.36 AVA VTI 1.12 - no symptoms   5. Hyperlipidemia - on simvastatin - labs followed by pcp   Past Medical History:  Diagnosis Date   Arthritis    Atrial fibrillation (HCC)    Auto immune neutropenia (HCC)    Bullous pemphigoid    Cancer (HCC)    Prostate   Cervical spondylosis without myelopathy 01/22/2016   Chronic kidney disease    Per MD note 05/2021 CKD stage non  specified   Complication of anesthesia    difficult to wake up after anesthesia   DVT (deep venous thrombosis) ([place])    a. Has had a previous right lower extremity DVT in the setting of hospitalization and surgery (after his brain tumor was resected in 1993) but is no longer on Coumadin.   Dyslipidemia    Dysrhythmia    History of kidney stones    History of prostate cancer    Hypertension    Meningioma ([place])    a. s/p resection 1990s.   Myasthenia ([place]) 02/14/2013   Nocturnal leg cramps    Ocular myasthenia gravis (HCC)    Peripheral neuropathy 11/23/2018   Seizures (HCC)    Sinus bradycardia    Sleep paralysis    Thyroid nodule, cold      Allergies  Allergen Reactions   Iodinated Contrast Media Hives     pt was premedicated/hives reaction occurred 24 hours post injection, Onset Date: 16967893    Sulfa Antibiotics Hives, Swelling and Rash   Bactericin [Bacitracin] Other (See Comments)    Unknown reaction      Current Outpatient Medications  Medication Sig Dispense Refill   amiodarone (PACERONE) 200 MG tablet Take 1 tablet (200 mg total) by mouth daily. 90 tablet 3   amLODipine (NORVASC) 2.5 MG tablet Take 1 tablet (2.5 mg total) by mouth daily.  90 tablet 3   cholecalciferol (VITAMIN D3) 25 MCG (1000 UNIT) tablet Take 1,000 Units by mouth daily.     clobetasol (TEMOVATE) 0.05 % external solution Apply 1 Application topically 2 (two) times daily as needed (blisters).     clobetasol cream (TEMOVATE) 1.94 % Apply 1 application  topically 2 (two) times daily as needed (blisters).     diclofenac (VOLTAREN) 75 MG EC tablet Take 75 mg by mouth 2 (two) times daily.     fish oil-omega-3 fatty acids 1000 MG capsule Take 1 g by mouth 4 (four) times daily.     gabapentin (NEURONTIN) 600 MG tablet Take 1 tablet (600 mg total) by mouth daily. (Patient taking differently: Take 600 mg by mouth at bedtime.) 90 tablet 3   Garlic (GARLIQUE PO) Take 1 tablet by mouth daily.      HYDROcodone-acetaminophen (NORCO/VICODIN) 5-325 MG tablet Take 1 tablet by mouth 2 (two) times daily.     levETIRAcetam (KEPPRA) 500 MG tablet Take 1/2 tablet in the morning, take 1 tablet in the evening 135 tablet 3   losartan (COZAAR) 50 MG tablet Take 1 tablet (50 mg total) by mouth in the morning and at bedtime. 180 tablet 3   niacinamide 500 MG tablet Take 500 mg by mouth 3 (three) times daily.     oxymetazoline (AFRIN) 0.05 % nasal spray Place 1 spray into both nostrils 2 (two) times daily as needed for congestion.     Polyethyl Glycol-Propyl Glycol 0.4-0.3 % SOLN Place 1 drop into both eyes 3 (three) times daily.     pyridostigmine (MESTINON) 60 MG tablet Take 90 mg by mouth 3 (three) times daily.     simvastatin (ZOCOR) 20 MG tablet Take 20 mg by mouth at bedtime.     tiZANidine (ZANAFLEX) 4 MG tablet Take 4 mg by mouth at bedtime.     No current facility-administered medications for this visit.     Past Surgical History:  Procedure Laterality Date   BLADDER SURGERY     removed part of the bladder   BRAIN SURGERY     CARDIOVERSION N/A 11/07/2020   Procedure: CARDIOVERSION;  Surgeon: Arnoldo Lenis, MD;  Location: [facility];  Service: Endoscopy;  Laterality: N/A;   CARDIOVERSION N/A 11/20/2021   Procedure: CARDIOVERSION;  Surgeon: Arnoldo Lenis, MD;  Location: [facility];  Service: Endoscopy;  Laterality: N/A;   CHOLECYSTECTOMY     CLIPPING OF ATRIAL APPENDAGE N/A 04/20/2022   Procedure: CLIPPING OF ATRIAL APPENDAGE USING 45 ATRIAL CLIP;  Surgeon: Neomia Glass, MD;  Location: [place];  Service: Open Heart Surgery;  Laterality: N/A;   COLONOSCOPY N/A 07/25/2013   Procedure: COLONOSCOPY;  Surgeon: Jamesetta So, MD;  [facility];  Service: Gastroenterology;  Laterality: N/A;   COLONOSCOPY N/A 11/10/2016   Procedure: COLONOSCOPY;  Surgeon: Aviva Signs, MD;  [facility];  Service: Gastroenterology;  Laterality: N/A;   IR CATHETER TUBE CHANGE  01/02/2017    IR RADIOLOGIST EVAL & MGMT  01/06/2017   IR RADIOLOGIST EVAL & MGMT  01/20/2017   IR RADIOLOGIST EVAL & MGMT  02/03/2017   IR RADIOLOGIST EVAL & MGMT  02/17/2017   IR SINUS/FIST TUBE CHK-NON GI  03/03/2017   PARTIAL COLECTOMY     RADIOACTIVE SEED IMPLANT     TEE WITHOUT CARDIOVERSION N/A 04/20/2022   Procedure: TRANSESOPHAGEAL ECHOCARDIOGRAM (TEE);  Surgeon: Neomia Glass, MD;  Location: [facility];  Service: Open Heart Surgery;  Laterality: N/A;  VIDEO ASSISTED THORACOSCOPY Left 04/20/2022   Procedure: VIDEO ASSISTED THORACOSCOPY;  Surgeon: Neomia Glass, MD;  Location: [facility];  Service: Thoracic;  Laterality: Left;     Allergies  Allergen Reactions   Iodinated Contrast Media Hives     pt was premedicated/hives reaction occurred 24 hours post injection, Onset Date: [id number]    Sulfa Antibiotics Hives, Swelling and Rash   Bactericin [Bacitracin] Other (See Comments)    Unknown reaction       Family History  Problem Relation Age of Onset   Heart failure Mother    Diabetes Mother    Diabetes Sister    Dementia Sister    Lung cancer Son    Heart failure Daughter    Colon cancer Neg Hx      Social History Kyle [name] reports that he has never smoked. He has never been exposed to tobacco smoke. He has never used smokeless tobacco. Kyle [name] reports no history of alcohol use.   Review of Systems CONSTITUTIONAL: No weight loss, fever, chills, weakness or fatigue.  HEENT: Eyes: No visual loss, blurred vision, double vision or yellow sclerae.No hearing loss, sneezing, congestion, runny nose or sore throat.  SKIN: No rash or itching.  CARDIOVASCULAR: per hpi RESPIRATORY: No shortness of breath, cough or sputum.  GASTROINTESTINAL: No anorexia, nausea, vomiting or diarrhea. No abdominal pain or blood.  GENITOURINARY: No burning on urination, no polyuria NEUROLOGICAL: No headache, dizziness, syncope, paralysis, ataxia, numbness or tingling in the extremities. No change in bowel or  bladder control.  MUSCULOSKELETAL: No muscle, back pain, joint pain or stiffness.  LYMPHATICS: No enlarged nodes. No history of splenectomy.  PSYCHIATRIC: No history of depression or anxiety.  ENDOCRINOLOGIC: No reports of sweating, cold or heat intolerance. No polyuria or polydipsia.  Marland Kitchen   Physical Examination Today's Vitals   05/12/22 0856  BP: (!) 152/62  Pulse: (!) 59  SpO2: 95%  Weight: 156 lb (70.8 kg)  Height: 5' 7.5" (1.715 m)   Body mass index is 24.07 kg/m.  Gen: resting comfortably, no acute distress HEENT: no scleral icterus, pupils equal round and reactive, no palptable cervical adenopathy,  CV: RRR, 3/6 systolic murmur rusb, no jvd Resp: Clear to auscultation bilaterally GI: abdomen is soft, non-tender, non-distended, normal bowel sounds, no hepatosplenomegaly MSK: extremities are warm, 1+ bilateral LE edema Skin: warm, no rash Neuro:  no focal deficits Psych: appropriate affect     Assessment and Plan  1.Aflutter/acquired thrombophilia - multiple cardioversions within the last several months - did not tolerate av nodal agents due to bradycardia - started on amio 02/2022 after most recent DCCV in ER, since that time maintaining SR - continue amiodarone - s/p thorascopic appendage closure, off DOACs. He has debilitating arthritis that requires NSAID use that had been contraindicated when on DOAC  2. HTN - above goal - LE edema on norvasc that was recently started, will d/c - start chlortahldione 12.'5mg'$  daily, bmet/mg in 2 weeks    3. Aortic stenosis - moderate and asymptomatic, likely repeat echo later this year.   F/u 6 months   Arnoldo Lenis, M.D.

## 2022-05-12 NOTE — Patient Instructions (Addendum)
Medication Instructions:  Amiodarone refilled today  Stop Amlodipine (Norvasc) Begin Chlorthalidone 12.'5mg'$  daily  Continue all other medications.     Labwork: BMET, Mg - orders given today Please do in 2 weeks Office will contact with results via phone, letter or mychart.     Testing/Procedures: none  Follow-Up: 6 months   Any Other Special Instructions Will Be Listed Below (If Applicable). BP log x 2 weeks   If you need a refill on your cardiac medications before your next appointment, please call your pharmacy.

## 2022-05-14 ENCOUNTER — Ambulatory Visit: Payer: Medicare Other | Attending: Internal Medicine | Admitting: Internal Medicine

## 2022-05-14 ENCOUNTER — Ambulatory Visit: Payer: Medicare Other | Admitting: Internal Medicine

## 2022-05-14 ENCOUNTER — Encounter: Payer: Self-pay | Admitting: Internal Medicine

## 2022-05-14 VITALS — BP 160/88 | HR 79 | Ht 67.5 in | Wt 161.4 lb

## 2022-05-14 DIAGNOSIS — I484 Atypical atrial flutter: Secondary | ICD-10-CM | POA: Diagnosis not present

## 2022-05-14 NOTE — Patient Instructions (Signed)
Medication Instructions:  Your physician recommends that you continue on your current medications as directed. Please refer to the Current Medication list given to you today.  *If you need a refill on your cardiac medications before your next appointment, please call your pharmacy*   Lab Work: NONE   If you have labs (blood work) drawn today and your tests are completely normal, you will receive your results only by: MyChart Message (if you have MyChart) OR A paper copy in the mail If you have any lab test that is abnormal or we need to change your treatment, we will call you to review the results.   Testing/Procedures: NONE    Follow-Up: At [facility], you and your health needs are our priority.  As part of our continuing mission to provide you with exceptional heart care, we have created designated Provider Care Teams.  These Care Teams include your primary Cardiologist (physician) and Advanced Practice Providers (APPs -  Physician Assistants and Nurse Practitioners) who all work together to provide you with the care you need, when you need it.  We recommend signing up for the patient portal called "MyChart".  Sign up information is provided on this After Visit Summary.  MyChart is used to connect with patients for Virtual Visits (Telemedicine).  Patients are able to view lab/test results, encounter notes, upcoming appointments, etc.  Non-urgent messages can be sent to your provider as well.   To learn more about what you can do with MyChart, go to [email or web address].    Your next appointment:   6 month(s)  Provider:   Gregg Taylor, MD    Other Instructions Thank you for choosing [facility]!    

## 2022-05-14 NOTE — Progress Notes (Signed)
HPI Kyle [name] is referred today for evaluation of atrial flutter. He is a pleasant 88 yo man with recurrent atrial flutter s/p multiple DCCV's. Review of his ECG's demonstrates atypical atrial flutter with a RVR. He was placed on amiodarone after his last DCCV. He was thought to be a poor candidate for systemic anti-coag and underwent surgical LAA closure by Dr. Tenny Craw. The patient has not had more symptomatic atrial fib or flutter.  Allergies  Allergen Reactions   Iodinated Contrast Media Hives     pt was premedicated/hives reaction occurred 24 hours post injection, Onset Date: [id number]    Sulfa Antibiotics Hives, Swelling and Rash   Bactericin [Bacitracin] Other (See Comments)    Unknown reaction    Norvasc [Amlodipine] Swelling    Leg swelling     Current Outpatient Medications  Medication Sig Dispense Refill   amiodarone (PACERONE) 200 MG tablet Take 1 tablet (200 mg total) by mouth daily. 90 tablet 3   chlorthalidone (HYGROTON) 25 MG tablet Take 0.5 tablets (12.5 mg total) by mouth daily. 15 tablet 6   cholecalciferol (VITAMIN D3) 25 MCG (1000 UNIT) tablet Take 1,000 Units by mouth daily.     clobetasol (TEMOVATE) 0.05 % external solution Apply 1 Application topically 2 (two) times daily as needed (blisters).     clobetasol cream (TEMOVATE) 6.19 % Apply 1 application  topically 2 (two) times daily as needed (blisters).     diclofenac (VOLTAREN) 75 MG EC tablet Take 75 mg by mouth 2 (two) times daily.     fish oil-omega-3 fatty acids 1000 MG capsule Take 1 g by mouth 4 (four) times daily.     gabapentin (NEURONTIN) 600 MG tablet Take 1 tablet (600 mg total) by mouth daily. (Patient taking differently: Take 600 mg by mouth at bedtime.) 90 tablet 3   Garlic (GARLIQUE PO) Take 1 tablet by mouth daily.     HYDROcodone-acetaminophen (NORCO/VICODIN) 5-325 MG tablet Take 1 tablet by mouth 2 (two) times daily.     levETIRAcetam (KEPPRA) 500 MG tablet Take 1/2 tablet in the morning,  take 1 tablet in the evening 135 tablet 3   losartan (COZAAR) 50 MG tablet Take 1 tablet (50 mg total) by mouth in the morning and at bedtime. 180 tablet 3   niacinamide 500 MG tablet Take 500 mg by mouth 3 (three) times daily.     oxymetazoline (AFRIN) 0.05 % nasal spray Place 1 spray into both nostrils 2 (two) times daily as needed for congestion.     Polyethyl Glycol-Propyl Glycol 0.4-0.3 % SOLN Place 1 drop into both eyes 3 (three) times daily.     pyridostigmine (MESTINON) 60 MG tablet Take 90 mg by mouth 3 (three) times daily.     simvastatin (ZOCOR) 20 MG tablet Take 20 mg by mouth at bedtime.     tiZANidine (ZANAFLEX) 4 MG tablet Take 4 mg by mouth at bedtime.     No current facility-administered medications for this visit.     Past Medical History:  Diagnosis Date   Arthritis    Atrial fibrillation (HCC)    Auto immune neutropenia (HCC)    Bullous pemphigoid    Cancer (HCC)    Prostate   Cervical spondylosis without myelopathy 01/22/2016   Chronic kidney disease    Per MD note 05/2021 CKD stage non specified   Complication of anesthesia    difficult to wake up after anesthesia   DVT (deep venous thrombosis) ([place])  a. Has had a previous right lower extremity DVT in the setting of hospitalization and surgery (after his brain tumor was resected in 1993) but is no longer on Coumadin.   Dyslipidemia    Dysrhythmia    History of kidney stones    History of prostate cancer    Hypertension    Meningioma ([place])    a. s/p resection 1990s.   Myasthenia ([place]) 02/14/2013   Nocturnal leg cramps    Ocular myasthenia gravis (HCC)    Peripheral neuropathy 11/23/2018   Seizures ([place])    Sinus bradycardia    Sleep paralysis    Thyroid nodule, cold     ROS:   All systems reviewed and negative except as noted in the HPI.   Past Surgical History:  Procedure Laterality Date   BLADDER SURGERY     removed part of the bladder   BRAIN SURGERY     CARDIOVERSION N/A 11/07/2020    Procedure: CARDIOVERSION;  Surgeon: Arnoldo Lenis, MD;  [facility];  Service: Endoscopy;  Laterality: N/A;   CARDIOVERSION N/A 11/20/2021   Procedure: CARDIOVERSION;  Surgeon: Arnoldo Lenis, MD;  [facility];  Service: Endoscopy;  Laterality: N/A;   CHOLECYSTECTOMY     CLIPPING OF ATRIAL APPENDAGE N/A 04/20/2022   Procedure: CLIPPING OF ATRIAL APPENDAGE USING 45 ATRIAL CLIP;  Surgeon: Neomia Glass, MD;  Location: [place];  Service: Open Heart Surgery;  Laterality: N/A;   COLONOSCOPY N/A 07/25/2013   Procedure: COLONOSCOPY;  Surgeon: Jamesetta So, MD;  [facility];  Service: Gastroenterology;  Laterality: N/A;   COLONOSCOPY N/A 11/10/2016   Procedure: COLONOSCOPY;  Surgeon: Aviva Signs, MD;  [facility];  Service: Gastroenterology;  Laterality: N/A;   IR CATHETER TUBE CHANGE  01/02/2017   IR RADIOLOGIST EVAL & MGMT  01/06/2017   IR RADIOLOGIST EVAL & MGMT  01/20/2017   IR RADIOLOGIST EVAL & MGMT  02/03/2017   IR RADIOLOGIST EVAL & MGMT  02/17/2017   IR SINUS/FIST TUBE CHK-NON GI  03/03/2017   PARTIAL COLECTOMY     RADIOACTIVE SEED IMPLANT     TEE WITHOUT CARDIOVERSION N/A 04/20/2022   Procedure: TRANSESOPHAGEAL ECHOCARDIOGRAM (TEE);  Surgeon: Neomia Glass, MD;  Location: [place];  Service: Open Heart Surgery;  Laterality: N/A;   VIDEO ASSISTED THORACOSCOPY Left 04/20/2022   Procedure: VIDEO ASSISTED THORACOSCOPY;  Surgeon: Neomia Glass, MD;  Location: [facility];  Service: Thoracic;  Laterality: Left;     Family History  Problem Relation Age of Onset   Heart failure Mother    Diabetes Mother    Diabetes Sister    Dementia Sister    Lung cancer Son    Heart failure Daughter    Colon cancer Neg Hx      Social History   Socioeconomic History   Marital status: Married    Spouse name: Banker   Number of children: 1   Years of education: 33 TH   Highest education level: Not on file  Occupational History   Occupation: retired  Tobacco  Use   Smoking status: Never    Passive exposure: Never   Smokeless tobacco: Never  Vaping Use   Vaping Use: Never used  Substance and Sexual Activity   Alcohol use: No   Drug use: No   Sexual activity: Not on file  Other Topics Concern   Not on file  Social History Narrative   Patient is right handed.   Patient  drinks 1 cup of coffee daily.   Social Determinants of Health   Financial Resource Strain: Low Risk  (04/24/2022)   Overall Financial Resource Strain (CARDIA)    Difficulty of Paying Living Expenses: Not hard at all  Food Insecurity: No Food Insecurity (04/20/2022)   Hunger Vital Sign    Worried About Running Out of Food in the Last Year: Never true    Ran Out of Food in the Last Year: Never true  Transportation Needs: No Transportation Needs (04/24/2022)   PRAPARE - Hydrologist (Medical): No    Lack of Transportation (Non-Medical): No  Physical Activity: Not on file  Stress: Not on file  Social Connections: Not on file  Intimate Partner Violence: Not At Risk (04/20/2022)   Humiliation, Afraid, Rape, and Kick questionnaire    Fear of Current or Ex-Partner: No    Emotionally Abused: No    Physically Abused: No    Sexually Abused: No     BP (!) 160/88   Pulse 79   Ht 5' 7.5" (1.715 m)   Wt 161 lb 6.4 oz (73.2 kg)   SpO2 98%   BMI 24.91 kg/m   Physical Exam:  Well appearing NAD HEENT: Unremarkable Neck:  No JVD, no thyromegally Lymphatics:  No adenopathy Back:  No CVA tenderness Lungs:  Clear with no wheezes HEART:  Regular rate rhythm, no murmurs, no rubs, no clicks Abd:  soft, positive bowel sounds, no organomegally, no rebound, no guarding Ext:  2 plus pulses, no edema, no cyanosis, no clubbing Skin:  No rashes no nodules Neuro:  CN II through XII intact, motor grossly intact  EKG - none  Assess/Plan: Recurrent LA flutter - I have discussed the treatment options with the patient. I have recommended he continue his low  dose of amiodarone. If he has more flutter on amio or develops symptomatic brady, we will cosider more options.  Sinus node dysfunction - no evidence for the need for PPM at this time but we will undergo watchful waiting.  HTN - his b p is elevated but is usually much better at home.   Kyle Overlie Dimitrius Steedman,MD

## 2022-05-15 ENCOUNTER — Ambulatory Visit: Payer: Medicare Other | Admitting: "Endocrinology

## 2022-05-15 ENCOUNTER — Encounter: Payer: Self-pay | Admitting: "Endocrinology

## 2022-05-15 VITALS — BP 138/62 | HR 56 | Ht 67.5 in | Wt 160.4 lb

## 2022-05-15 DIAGNOSIS — E042 Nontoxic multinodular goiter: Secondary | ICD-10-CM | POA: Diagnosis not present
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
## 2022-05-21 ENCOUNTER — Ambulatory Visit: Payer: Medicare Other | Admitting: General Surgery

## 2022-05-21 ENCOUNTER — Encounter: Payer: Self-pay | Admitting: General Surgery

## 2022-05-21 VITALS — BP 150/70 | HR 63 | Temp 98.9°F | Resp 14 | Ht 67.5 in | Wt 160.0 lb

## 2022-05-21 DIAGNOSIS — K4090 Unilateral inguinal hernia, without obstruction or gangrene, not specified as recurrent: Secondary | ICD-10-CM

## 2022-05-22 NOTE — Progress Notes (Signed)
Subjective:     Kyle [name].  Patient returns to my care for evaluation and treatment of a known left inguinal hernia.  It primarily stays out and he has tried a truss but has not been helpful.  He has multiple medical problems and last month underwent a thoracoscopy with stapling of an atrial appendage.  Patient has not had any nausea or vomiting.  He has not had any episodes of incarceration.  He is now only on Plavix for his atrial fibrillation. Objective:    BP (!) 150/70   Pulse 63   Temp 98.9 F (37.2 C) (Oral)   Resp 14   Ht 5' 7.5" (1.715 m)   Wt 160 lb (72.6 kg)   SpO2 95%   BMI 24.69 kg/m   General:  alert, cooperative, and no distress  Abdomen soft, nontender, nondistended.  A large reducible left inguinal hernia is noted.  It is nontender.     Assessment:    Large left inguinal hernia, easily reducible.  No episodes of incarceration.  Multiple comorbidities including recent cardiac surgery.  Does have a significant history of paroxysmal atrial fibrillation.    Plan:   I reiterated to the patient that I do not feel that fixing his hernia is in his best interest as the risks outweigh the benefits.  He has not had an episode of incarceration.  As he recently had cardiac surgery, he would need cardiac clearance prior to any surgical intervention.  Follow-up as needed.

## 2022-06-03 ENCOUNTER — Encounter: Payer: Self-pay | Admitting: Cardiology

## 2022-06-03 DIAGNOSIS — I4892 Unspecified atrial flutter: Secondary | ICD-10-CM | POA: Diagnosis not present

## 2022-06-03 DIAGNOSIS — Z79899 Other long term (current) drug therapy: Secondary | ICD-10-CM | POA: Diagnosis not present

## 2022-06-03 DIAGNOSIS — I10 Essential (primary) hypertension: Secondary | ICD-10-CM | POA: Diagnosis not present

## 2022-06-04 LAB — BASIC METABOLIC PANEL
BUN/Creatinine Ratio: 35 (calc) — ABNORMAL HIGH (ref 6–22)
BUN: 44 mg/dL — ABNORMAL HIGH (ref 7–25)
CO2: 24 mmol/L (ref 20–32)
Calcium: 9.8 mg/dL (ref 8.6–10.3)
Chloride: 105 mmol/L (ref 98–110)
Creat: 1.25 mg/dL — ABNORMAL HIGH (ref 0.70–1.22)
Glucose, Bld: 93 mg/dL (ref 65–139)
Potassium: 4.6 mmol/L (ref 3.5–5.3)
Sodium: 139 mmol/L (ref 135–146)

## 2022-06-04 LAB — MAGNESIUM: Magnesium: 1.9 mg/dL (ref 1.5–2.5)

## 2022-06-08 DIAGNOSIS — R6889 Other general symptoms and signs: Secondary | ICD-10-CM | POA: Diagnosis not present

## 2022-06-08 DIAGNOSIS — R001 Bradycardia, unspecified: Secondary | ICD-10-CM | POA: Diagnosis not present

## 2022-06-08 DIAGNOSIS — Z743 Need for continuous supervision: Secondary | ICD-10-CM | POA: Diagnosis not present

## 2022-06-08 DIAGNOSIS — I499 Cardiac arrhythmia, unspecified: Secondary | ICD-10-CM | POA: Diagnosis not present

## 2022-06-08 DIAGNOSIS — R531 Weakness: Secondary | ICD-10-CM | POA: Diagnosis not present

## 2022-06-09 ENCOUNTER — Other Ambulatory Visit: Payer: Self-pay

## 2022-06-09 ENCOUNTER — Encounter (HOSPITAL_COMMUNITY): Payer: Self-pay | Admitting: Emergency Medicine

## 2022-06-09 ENCOUNTER — Inpatient Hospital Stay (HOSPITAL_COMMUNITY): Payer: Medicare Other

## 2022-06-09 ENCOUNTER — Ambulatory Visit: Payer: Self-pay | Admitting: *Deleted

## 2022-06-09 ENCOUNTER — Inpatient Hospital Stay (HOSPITAL_COMMUNITY)
Admission: EM | Admit: 2022-06-09 | Discharge: 2022-06-10 | DRG: 683 | Disposition: A | Discharge status: Home / Self Care | Payer: Medicare Other | Attending: Internal Medicine | Admitting: Internal Medicine

## 2022-06-09 ENCOUNTER — Emergency Department (HOSPITAL_COMMUNITY): Payer: Medicare Other

## 2022-06-09 DIAGNOSIS — M7989 Other specified soft tissue disorders: Secondary | ICD-10-CM | POA: Diagnosis not present

## 2022-06-09 DIAGNOSIS — I10 Essential (primary) hypertension: Secondary | ICD-10-CM | POA: Diagnosis present

## 2022-06-09 DIAGNOSIS — E860 Dehydration: Secondary | ICD-10-CM | POA: Diagnosis present

## 2022-06-09 DIAGNOSIS — S82891A Other fracture of right lower leg, initial encounter for closed fracture: Secondary | ICD-10-CM | POA: Diagnosis not present

## 2022-06-09 DIAGNOSIS — R7401 Elevation of levels of liver transaminase levels: Secondary | ICD-10-CM | POA: Diagnosis not present

## 2022-06-09 DIAGNOSIS — I4892 Unspecified atrial flutter: Secondary | ICD-10-CM | POA: Diagnosis not present

## 2022-06-09 DIAGNOSIS — Z87442 Personal history of urinary calculi: Secondary | ICD-10-CM

## 2022-06-09 DIAGNOSIS — R55 Syncope and collapse: Secondary | ICD-10-CM | POA: Diagnosis not present

## 2022-06-09 DIAGNOSIS — Z86011 Personal history of benign neoplasm of the brain: Secondary | ICD-10-CM

## 2022-06-09 DIAGNOSIS — R778 Other specified abnormalities of plasma proteins: Secondary | ICD-10-CM

## 2022-06-09 DIAGNOSIS — Z8546 Personal history of malignant neoplasm of prostate: Secondary | ICD-10-CM

## 2022-06-09 DIAGNOSIS — R42 Dizziness and giddiness: Secondary | ICD-10-CM

## 2022-06-09 DIAGNOSIS — R001 Bradycardia, unspecified: Secondary | ICD-10-CM | POA: Diagnosis present

## 2022-06-09 DIAGNOSIS — R600 Localized edema: Secondary | ICD-10-CM | POA: Diagnosis not present

## 2022-06-09 DIAGNOSIS — Z91041 Radiographic dye allergy status: Secondary | ICD-10-CM

## 2022-06-09 DIAGNOSIS — R739 Hyperglycemia, unspecified: Secondary | ICD-10-CM | POA: Diagnosis present

## 2022-06-09 DIAGNOSIS — S82401A Unspecified fracture of shaft of right fibula, initial encounter for closed fracture: Secondary | ICD-10-CM | POA: Diagnosis not present

## 2022-06-09 DIAGNOSIS — M1990 Unspecified osteoarthritis, unspecified site: Secondary | ICD-10-CM | POA: Diagnosis present

## 2022-06-09 DIAGNOSIS — I129 Hypertensive chronic kidney disease with stage 1 through stage 4 chronic kidney disease, or unspecified chronic kidney disease: Secondary | ICD-10-CM | POA: Diagnosis not present

## 2022-06-09 DIAGNOSIS — I440 Atrioventricular block, first degree: Secondary | ICD-10-CM | POA: Diagnosis not present

## 2022-06-09 DIAGNOSIS — R7989 Other specified abnormal findings of blood chemistry: Secondary | ICD-10-CM | POA: Diagnosis present

## 2022-06-09 DIAGNOSIS — I4891 Unspecified atrial fibrillation: Secondary | ICD-10-CM | POA: Diagnosis present

## 2022-06-09 DIAGNOSIS — Z86718 Personal history of other venous thrombosis and embolism: Secondary | ICD-10-CM | POA: Diagnosis not present

## 2022-06-09 DIAGNOSIS — N189 Chronic kidney disease, unspecified: Secondary | ICD-10-CM | POA: Diagnosis present

## 2022-06-09 DIAGNOSIS — R531 Weakness: Secondary | ICD-10-CM | POA: Diagnosis not present

## 2022-06-09 DIAGNOSIS — Z882 Allergy status to sulfonamides status: Secondary | ICD-10-CM

## 2022-06-09 DIAGNOSIS — N179 Acute kidney failure, unspecified: Secondary | ICD-10-CM | POA: Diagnosis not present

## 2022-06-09 DIAGNOSIS — I2489 Other forms of acute ischemic heart disease: Secondary | ICD-10-CM | POA: Diagnosis present

## 2022-06-09 DIAGNOSIS — G7000 Myasthenia gravis without (acute) exacerbation: Secondary | ICD-10-CM | POA: Diagnosis present

## 2022-06-09 DIAGNOSIS — G40909 Epilepsy, unspecified, not intractable, without status epilepticus: Secondary | ICD-10-CM | POA: Diagnosis present

## 2022-06-09 DIAGNOSIS — E782 Mixed hyperlipidemia: Secondary | ICD-10-CM | POA: Diagnosis not present

## 2022-06-09 DIAGNOSIS — Z8249 Family history of ischemic heart disease and other diseases of the circulatory system: Secondary | ICD-10-CM | POA: Diagnosis not present

## 2022-06-09 DIAGNOSIS — G8929 Other chronic pain: Secondary | ICD-10-CM | POA: Diagnosis present

## 2022-06-09 DIAGNOSIS — I350 Nonrheumatic aortic (valve) stenosis: Secondary | ICD-10-CM | POA: Diagnosis present

## 2022-06-09 DIAGNOSIS — I495 Sick sinus syndrome: Secondary | ICD-10-CM | POA: Diagnosis not present

## 2022-06-09 DIAGNOSIS — Z888 Allergy status to other drugs, medicaments and biological substances status: Secondary | ICD-10-CM

## 2022-06-09 DIAGNOSIS — Z79899 Other long term (current) drug therapy: Secondary | ICD-10-CM

## 2022-06-09 DIAGNOSIS — Z9049 Acquired absence of other specified parts of digestive tract: Secondary | ICD-10-CM

## 2022-06-09 DIAGNOSIS — R569 Unspecified convulsions: Secondary | ICD-10-CM

## 2022-06-09 DIAGNOSIS — I455 Other specified heart block: Secondary | ICD-10-CM | POA: Diagnosis present

## 2022-06-09 LAB — COMPREHENSIVE METABOLIC PANEL
ALT: 57 U/L — ABNORMAL HIGH (ref 0–44)
ALT: 87 U/L — ABNORMAL HIGH (ref 0–44)
AST: 63 U/L — ABNORMAL HIGH (ref 15–41)
AST: 104 U/L — ABNORMAL HIGH (ref 15–41)
Albumin: 3.5 g/dL (ref 3.5–5.0)
Albumin: 3.4 g/dL — ABNORMAL LOW (ref 3.5–5.0)
Alkaline Phosphatase: 55 U/L (ref 38–126)
Alkaline Phosphatase: 60 U/L (ref 38–126)
Anion gap: 13 (ref 5–15)
Anion gap: 11 (ref 5–15)
BUN: 62 mg/dL — ABNORMAL HIGH (ref 8–23)
BUN: 61 mg/dL — ABNORMAL HIGH (ref 8–23)
CO2: 20 mmol/L — ABNORMAL LOW (ref 22–32)
CO2: 20 mmol/L — ABNORMAL LOW (ref 22–32)
Calcium: 8.9 mg/dL (ref 8.9–10.3)
Calcium: 8.8 mg/dL — ABNORMAL LOW (ref 8.9–10.3)
Chloride: 102 mmol/L (ref 98–111)
Chloride: 105 mmol/L (ref 98–111)
Creatinine, Ser: 2.23 mg/dL — ABNORMAL HIGH (ref 0.61–1.24)
Creatinine, Ser: 1.98 mg/dL — ABNORMAL HIGH (ref 0.61–1.24)
GFR, Estimated: 28 mL/min — ABNORMAL LOW (ref >60)
GFR, Estimated: 32 mL/min — ABNORMAL LOW (ref >60)
Glucose, Bld: 229 mg/dL — ABNORMAL HIGH (ref 70–99)
Glucose, Bld: 131 mg/dL — ABNORMAL HIGH (ref 70–99)
Potassium: 4.3 mmol/L (ref 3.5–5.1)
Potassium: 4 mmol/L (ref 3.5–5.1)
Sodium: 135 mmol/L (ref 135–145)
Sodium: 136 mmol/L (ref 135–145)
Total Bilirubin: 0.9 mg/dL (ref 0.3–1.2)
Total Bilirubin: 0.5 mg/dL (ref 0.3–1.2)
Total Protein: 6.6 g/dL (ref 6.5–8.1)
Total Protein: 6.7 g/dL (ref 6.5–8.1)

## 2022-06-09 LAB — VITAMIN B12: Vitamin B-12: 258 pg/mL (ref 180–914)

## 2022-06-09 LAB — CBC
HCT: 31.2 % — ABNORMAL LOW (ref 39.0–52.0)
HCT: 32.4 % — ABNORMAL LOW (ref 39.0–52.0)
Hemoglobin: 10.2 g/dL — ABNORMAL LOW (ref 13.0–17.0)
Hemoglobin: 10.4 g/dL — ABNORMAL LOW (ref 13.0–17.0)
MCH: 31 pg (ref 26.0–34.0)
MCH: 31.3 pg (ref 26.0–34.0)
MCHC: 32.1 g/dL (ref 30.0–36.0)
MCHC: 32.7 g/dL (ref 30.0–36.0)
MCV: 95.7 fL (ref 80.0–100.0)
MCV: 96.7 fL (ref 80.0–100.0)
Platelets: 169 10*3/uL (ref 150–400)
Platelets: 175 10*3/uL (ref 150–400)
RBC: 3.26 MIL/uL — ABNORMAL LOW (ref 4.22–5.81)
RBC: 3.35 MIL/uL — ABNORMAL LOW (ref 4.22–5.81)
RDW: 13.9 % (ref 11.5–15.5)
RDW: 14 % (ref 11.5–15.5)
WBC: 11.2 10*3/uL — ABNORMAL HIGH (ref 4.0–10.5)
WBC: 9.1 10*3/uL (ref 4.0–10.5)
nRBC: 0 % (ref 0.0–0.2)
nRBC: 0 % (ref 0.0–0.2)

## 2022-06-09 LAB — URINALYSIS, W/ REFLEX TO CULTURE (INFECTION SUSPECTED)
Bacteria, UA: NONE SEEN
Bilirubin Urine: NEGATIVE
Glucose, UA: NEGATIVE mg/dL
Hgb urine dipstick: NEGATIVE
Ketones, ur: NEGATIVE mg/dL
Leukocytes,Ua: NEGATIVE
Nitrite: NEGATIVE
Protein, ur: NEGATIVE mg/dL
Specific Gravity, Urine: 1.019 (ref 1.005–1.030)
pH: 5 (ref 5.0–8.0)

## 2022-06-09 LAB — TSH: TSH: 2.399 u[IU]/mL (ref 0.350–4.500)

## 2022-06-09 LAB — CBG MONITORING, ED
Glucose-Capillary: 88 mg/dL (ref 70–99)
Glucose-Capillary: 135 mg/dL — ABNORMAL HIGH (ref 70–99)

## 2022-06-09 LAB — HEPATITIS PANEL, ACUTE
HCV Ab: NONREACTIVE
Hep A IgM: NONREACTIVE
Hep B C IgM: NONREACTIVE
Hepatitis B Surface Ag: NONREACTIVE

## 2022-06-09 LAB — TROPONIN I (HIGH SENSITIVITY)
Troponin I (High Sensitivity): 114 ng/L (ref <18)
Troponin I (High Sensitivity): 112 ng/L (ref <18)

## 2022-06-09 LAB — MAGNESIUM: Magnesium: 2.3 mg/dL (ref 1.7–2.4)

## 2022-06-09 LAB — CK: Total CK: 54 U/L (ref 49–397)

## 2022-06-09 LAB — GLUCOSE, CAPILLARY
Glucose-Capillary: 142 mg/dL — ABNORMAL HIGH (ref 70–99)
Glucose-Capillary: 154 mg/dL — ABNORMAL HIGH (ref 70–99)

## 2022-06-09 LAB — FOLATE: Folate: 9.8 ng/mL (ref >5.9)

## 2022-06-09 MED ORDER — ACETAMINOPHEN 325 MG PO TABS: 650.0000 mg | ORAL_TABLET | Freq: Four times a day (QID) | ORAL | Status: DC | PRN

## 2022-06-09 MED ORDER — SIMVASTATIN 20 MG PO TABS
10.0000 mg | ORAL_TABLET | Freq: Every day | ORAL | Status: DC
  Administered 2022-06-09: 10 mg via ORAL
  Filled 2022-06-09: qty 1

## 2022-06-09 MED ORDER — SODIUM CHLORIDE 0.9 % IV BOLUS
500.0000 mL | Freq: Once | INTRAVENOUS | Status: AC
  Administered 2022-06-09: 500 mL via INTRAVENOUS

## 2022-06-09 MED ORDER — INSULIN ASPART 100 UNIT/ML IJ SOLN: 0.0000 [IU] | Freq: Every day | INTRAMUSCULAR | Status: DC

## 2022-06-09 MED ORDER — HEPARIN SODIUM (PORCINE) 5000 UNIT/ML IJ SOLN
5000.0000 [IU] | Freq: Three times a day (TID) | INTRAMUSCULAR | Status: DC
  Administered 2022-06-09 – 2022-06-10 (×4): 5000 [IU] via SUBCUTANEOUS
  Filled 2022-06-09 (×4): qty 1

## 2022-06-09 MED ORDER — ONDANSETRON HCL 4 MG/2ML IJ SOLN
4.0000 mg | Freq: Once | INTRAMUSCULAR | Status: AC
  Administered 2022-06-09: 4 mg via INTRAVENOUS
  Filled 2022-06-09: qty 2

## 2022-06-09 MED ORDER — PYRIDOSTIGMINE BROMIDE 60 MG PO TABS
90.0000 mg | ORAL_TABLET | Freq: Three times a day (TID) | ORAL | Status: DC
  Administered 2022-06-09 – 2022-06-10 (×4): 90 mg via ORAL
  Filled 2022-06-09 (×4): qty 2

## 2022-06-09 MED ORDER — ACETAMINOPHEN 650 MG RE SUPP: 650.0000 mg | Freq: Four times a day (QID) | RECTAL | Status: DC | PRN

## 2022-06-09 MED ORDER — LEVETIRACETAM 250 MG PO TABS
250.0000 mg | ORAL_TABLET | Freq: Every day | ORAL | Status: DC
  Administered 2022-06-09 – 2022-06-10 (×2): 250 mg via ORAL
  Filled 2022-06-09 (×2): qty 1

## 2022-06-09 MED ORDER — GABAPENTIN 300 MG PO CAPS: 600.0000 mg | ORAL_CAPSULE | Freq: Every day | ORAL | Status: DC

## 2022-06-09 MED ORDER — OXYCODONE HCL 5 MG PO TABS: 5.0000 mg | ORAL_TABLET | Freq: Four times a day (QID) | ORAL | Status: DC | PRN

## 2022-06-09 MED ORDER — SODIUM CHLORIDE 0.9% FLUSH
3.0000 mL | Freq: Two times a day (BID) | INTRAVENOUS | Status: DC
  Administered 2022-06-09 – 2022-06-10 (×2): 3 mL via INTRAVENOUS

## 2022-06-09 MED ORDER — INSULIN ASPART 100 UNIT/ML IJ SOLN
0.0000 [IU] | Freq: Three times a day (TID) | INTRAMUSCULAR | Status: DC
  Administered 2022-06-10: 1 [IU] via SUBCUTANEOUS

## 2022-06-09 MED ORDER — SODIUM CHLORIDE 0.9 % IV SOLN: INTRAVENOUS | Status: AC

## 2022-06-09 MED ORDER — TIZANIDINE HCL 4 MG PO TABS: 4.0000 mg | ORAL_TABLET | Freq: Every day | ORAL | Status: DC

## 2022-06-09 MED ORDER — ATROPINE SULFATE 1 MG/10ML IJ SOSY
0.5000 mg | PREFILLED_SYRINGE | Freq: Once | INTRAMUSCULAR | Status: AC
  Administered 2022-06-09: 0.5 mg via INTRAVENOUS
  Filled 2022-06-09: qty 10

## 2022-06-09 MED ORDER — LEVETIRACETAM 500 MG PO TABS
500.0000 mg | ORAL_TABLET | Freq: Every day | ORAL | Status: DC
  Administered 2022-06-09: 500 mg via ORAL
  Filled 2022-06-09: qty 1

## 2022-06-09 NOTE — Hospital Course (Signed)
88 year old male with a history of perforated diverticulitis, hypertension, hyperlipidemia, ocular myasthenia, provoked DVT not on anticoagulation, seizure disorder, atrial flutter status post multiple DCCV's, sinus node dysfunction (sinus brady), moderate aortic stenosis, nontoxic multinodular goiter presenting with generalized weakness and orthostasis type symptoms that began on 06/07/2022.  The patient had an episode of loose stool on 06/07/2022 and once again on 06/08/2022.  He denies any hematochezia or melena.  He took some antimotility medicine with some improvement.  He got up to use the bathroom in the evening of 06/08/2022 and felt lightheadedness and generalized weakness.  He tried to get up again, but was unable to stand secondary to generalized weakness and sat on the floor.  Subsequently, EMS was activated.  The patient denies any fevers, chills, headache, focal extremity weakness, chest pain, shortness breath, coughing, hemoptysis, nausea, vomiting, abdominal pain.  He denies any dysuria or hematuria.  Review of the medical record shows that the patient recently underwent LAA clipping performed by Dr. Tenny Craw on 04/20/2022.  Since then, the patient has resumed taking his diclofenac tablets twice daily.  In addition, the patient was recently started on chlorthalidone for his elevated blood pressure on 05/12/2022.  His amlodipine was discontinued on that day secondary to lower extremity edema.  He continues on losartan. He went to see Dr. Cristopher Peru on 05/14/2022 secondary to his ongoing bradycardia despite decreasing his metoprolol.  He was felt to have sinus node dysfunction.  Plan was for close observation.  In the ED, the patient was afebrile with soft blood pressures, SBP low 100s, upper 90s.  He was bradycardic into the 30s while asleep and in the 40s when awake.  Oxygen saturation was 100% room air.  WBC 11.2, hemoglobin 10.2, platelets 175,000.  Sodium 135, potassium 4.3, bicarbonate 20, serum  creatinine 2.23.  The patient was started on IV fluids.  Cardiology was consulted to assist with management for possible symptomatic bradycardia.

## 2022-06-09 NOTE — ED Notes (Signed)
Meal tray provided and set up for patient.

## 2022-06-09 NOTE — Consult Note (Addendum)
Cardiology Consultation   Patient ID: Som Huda. MRN: [id number]; DOB: 1934-09-02  Admit date: 06/09/2022 Date of Consult: 06/09/2022  PCP:  Redmond School, Kathleen Providers Cardiologist:  Carlyle Dolly, MD  Electrophysiologist:  Vickie Epley, MD  {   Patient Profile:   Rossie Sicotte. is a 88 y.o. male with a hx of perforated diverticulitis, hypertension, hyperlipidemia, ocular myasthenia, provoked DVT not on anticoagulation, seizure disorder, atrial flutter status post multiple cardioversions, sinus node dysfunction (sinus bradycardia), moderate aortic stenosis COVID toxic multinodular goiter presents with generalized weakness and orthostasis who is being seen 06/09/2022 for the evaluation of sinus bradycardia at the request of Dr. Carles Collet.  History of Present Illness:   Mr. Macdermott has been followed by Dr. Harl Bowie for the above cardiac issues.  She has a long history of atrial flutter with multiple cardioversions.  His most recent cardioversion he was placed on amiodarone in 03/02/2022.  Had been evaluated for watchman, however not able to proceed due to severe contrast allergy.  He was thought to be a poor candidate for systemic anticoag and underwent surgical LAA closure by Dr. Tenny Craw. The patient saw EP 05/14/22 for A flutter.  Patient presented to the ER at [facility] 06/09/2022 for a fall and generalized weakness. He reported a fall 4 days ago due to weakness and went from his feet to his knees. The day prior to presentation he reported generalized weakness and had  to hold onto the dresser to stand up. He felt weak and sat on the floor. He felt sweaty, dizzy and nausea. He had diarrhea following this incident. EMS was called who noted HR was really low.   In the ER blood pressure 105/63, pulse 43 bpm, respiratory rate 10, 96% O2.  EG shows sinus bradycardia, 41 bpm 1st degree AV block. Tele shows HR into the lower 30s. Labs showed WBC 11.2, hemoglobin  10.2, CO2 20, blood glucose 229, serum creatinine 2.23, BUN 62, AST 63, ALT 57.  High-sensitivity troponin 114>112.  Chest x-ray nonacute.  Patient was given 500 mL of saline, 0.5 mg of atropine and Zofran.  Patient was admitted for further workup   Past Medical History:  Diagnosis Date   Arthritis    Atrial fibrillation (HCC)    Auto immune neutropenia (HCC)    Bullous pemphigoid    Cancer ([place])    Prostate   Cervical spondylosis without myelopathy 01/22/2016   Chronic kidney disease    Per MD note 05/2021 CKD stage non specified   Complication of anesthesia    difficult to wake up after anesthesia   DVT (deep venous thrombosis) ([place])    a. Has had a previous right lower extremity DVT in the setting of hospitalization and surgery (after his brain tumor was resected in 1993) but is no longer on Coumadin.   Dyslipidemia    Dysrhythmia    History of kidney stones    History of prostate cancer    Hypertension    Meningioma ([place])    a. s/p resection 1990s.   Myasthenia ([place]) 02/14/2013   Nocturnal leg cramps    Ocular myasthenia gravis (HCC)    Peripheral neuropathy 11/23/2018   Seizures ([place])    Sinus bradycardia    Sleep paralysis    Thyroid nodule, cold     Past Surgical History:  Procedure Laterality Date   BLADDER SURGERY     removed part of the bladder   BRAIN SURGERY  CARDIOVERSION N/A 11/07/2020   Procedure: CARDIOVERSION;  Surgeon: Arnoldo Lenis, MD;  Location: [facility];  Service: Endoscopy;  Laterality: N/A;   CARDIOVERSION N/A 11/20/2021   Procedure: CARDIOVERSION;  Surgeon: Arnoldo Lenis, MD;  Location: [facility];  Service: Endoscopy;  Laterality: N/A;   CHOLECYSTECTOMY     CLIPPING OF ATRIAL APPENDAGE N/A 04/20/2022   Procedure: CLIPPING OF ATRIAL APPENDAGE USING 45 ATRIAL CLIP;  Surgeon: Neomia Glass, MD;  Location: [place];  Service: Open Heart Surgery;  Laterality: N/A;   COLONOSCOPY N/A 07/25/2013   Procedure: COLONOSCOPY;  Surgeon: Jamesetta So,  MD;  [facility];  Service: Gastroenterology;  Laterality: N/A;   COLONOSCOPY N/A 11/10/2016   Procedure: COLONOSCOPY;  Surgeon: Aviva Signs, MD;  [facility];  Service: Gastroenterology;  Laterality: N/A;   IR CATHETER TUBE CHANGE  01/02/2017   IR RADIOLOGIST EVAL & MGMT  01/06/2017   IR RADIOLOGIST EVAL & MGMT  01/20/2017   IR RADIOLOGIST EVAL & MGMT  02/03/2017   IR RADIOLOGIST EVAL & MGMT  02/17/2017   IR SINUS/FIST TUBE CHK-NON GI  03/03/2017   PARTIAL COLECTOMY     RADIOACTIVE SEED IMPLANT     TEE WITHOUT CARDIOVERSION N/A 04/20/2022   Procedure: TRANSESOPHAGEAL ECHOCARDIOGRAM (TEE);  Surgeon: Neomia Glass, MD;  Location: [place];  Service: Open Heart Surgery;  Laterality: N/A;   VIDEO ASSISTED THORACOSCOPY Left 04/20/2022   Procedure: VIDEO ASSISTED THORACOSCOPY;  Surgeon: Neomia Glass, MD;  Location: [facility];  Service: Thoracic;  Laterality: Left;     Home Medications:  Prior to Admission medications   Medication Sig Start Date End Date Taking? Authorizing Provider  amiodarone (PACERONE) 200 MG tablet Take 1 tablet (200 mg total) by mouth daily. 05/12/22 05/07/23 Yes Britta Louth, Alphonse Guild, MD  chlorthalidone (HYGROTON) 25 MG tablet Take 0.5 tablets (12.5 mg total) by mouth daily. 05/12/22  Yes BranchAlphonse Guild, MD  cholecalciferol (VITAMIN D3) 25 MCG (1000 UNIT) tablet Take 1,000 Units by mouth daily.   Yes [provider]  clobetasol (TEMOVATE) 0.05 % external solution Apply 1 Application topically 2 (two) times daily as needed (blisters).   Yes [provider]  clobetasol cream (TEMOVATE) [id number] % Apply 1 application  topically 2 (two) times daily as needed (blisters).   Yes [provider]  diclofenac (VOLTAREN) 75 MG EC tablet Take 75 mg by mouth 2 (two) times daily. 04/24/22  Yes [provider]  fish oil-omega-3 fatty acids 1000 MG capsule Take 1 g by mouth 4 (four) times daily.   Yes [provider]  gabapentin  (NEURONTIN) 600 MG tablet Take 1 tablet (600 mg total) by mouth daily. Patient taking differently: Take 600 mg by mouth at bedtime. 12/29/21  Yes Marcial Pacas, MD  Garlic (GARLIQUE PO) Take 1 tablet by mouth daily.   Yes [provider]  HYDROcodone-acetaminophen (NORCO/VICODIN) 5-325 MG tablet Take 1 tablet by mouth 2 (two) times daily. 11/06/21  Yes [provider]  levETIRAcetam (KEPPRA) 500 MG tablet Take 1/2 tablet in the morning, take 1 tablet in the evening 12/29/21  Yes Marcial Pacas, MD  losartan (COZAAR) 50 MG tablet Take 1 tablet (50 mg total) by mouth in the morning and at bedtime. 03/25/22 03/20/23 Yes Jesiel Garate, Alphonse Guild, MD  niacinamide 500 MG tablet Take 500 mg by mouth 3 (three) times daily.   Yes [provider]  oxymetazoline (AFRIN) 0.05 % nasal spray Place 1 spray into both  nostrils 2 (two) times daily as needed for congestion.   Yes [provider]  Polyethyl Glycol-Propyl Glycol 0.4-0.3 % SOLN Place 1 drop into both eyes 3 (three) times daily.   Yes [provider]  pyridostigmine (MESTINON) 60 MG tablet Take 90 mg by mouth 3 (three) times daily.   Yes [provider]  simvastatin (ZOCOR) 20 MG tablet Take 10 mg by mouth at bedtime. 10/24/21  Yes [provider]  tiZANidine (ZANAFLEX) 4 MG tablet Take 4 mg by mouth at bedtime. 07/19/20  Yes [provider]    Inpatient Medications: Scheduled Meds:  heparin  5,000 Units Subcutaneous Q8H   insulin aspart  0-5 Units Subcutaneous QHS   insulin aspart  0-6 Units Subcutaneous TID WC   levETIRAcetam  250 mg Oral Daily   levETIRAcetam  500 mg Oral QHS   pyridostigmine  90 mg Oral TID   simvastatin  10 mg Oral QHS   sodium chloride flush  3 mL Intravenous Q12H   Continuous Infusions:  sodium chloride 125 mL/hr at 06/09/22 0321   PRN Meds: acetaminophen **OR** acetaminophen  Allergies:    Allergies  Allergen Reactions   Iodinated Contrast Media Hives     pt was  premedicated/hives reaction occurred 24 hours post injection, Onset Date: [id number]    Sulfa Antibiotics Hives, Swelling and Rash   Bactericin [Bacitracin] Other (See Comments)    Unknown reaction    Norvasc [Amlodipine] Swelling    Leg swelling    Social History:   Social History   Socioeconomic History   Marital status: Married    Spouse name: Banker   Number of children: 1   Years of education: 12 TH   Highest education level: Not on file  Occupational History   Occupation: retired  Tobacco Use   Smoking status: Never    Passive exposure: Never   Smokeless tobacco: Never  Vaping Use   Vaping Use: Never used  Substance and Sexual Activity   Alcohol use: No   Drug use: No   Sexual activity: Not on file  Other Topics Concern   Not on file  Social History Narrative   Patient is right handed.   Patient drinks 1 cup of coffee daily.   Social Determinants of Health   Financial Resource Strain: Low Risk  (04/24/2022)   Overall Financial Resource Strain (CARDIA)    Difficulty of Paying Living Expenses: Not hard at all  Food Insecurity: No Food Insecurity (04/20/2022)   Hunger Vital Sign    Worried About Running Out of Food in the Last Year: Never true    Ran Out of Food in the Last Year: Never true  Transportation Needs: No Transportation Needs (04/24/2022)   PRAPARE - Hydrologist (Medical): No    Lack of Transportation (Non-Medical): No  Physical Activity: Not on file  Stress: Not on file  Social Connections: Not on file  Intimate Partner Violence: Not At Risk (04/20/2022)   Humiliation, Afraid, Rape, and Kick questionnaire    Fear of Current or Ex-Partner: No    Emotionally Abused: No    Physically Abused: No    Sexually Abused: No    Family History:    Family History  Problem Relation Age of Onset   Heart failure Mother    Diabetes Mother    Diabetes Sister    Dementia Sister    Lung cancer Son    Heart failure Daughter    Colon  cancer Neg Hx      ROS:  Please see the history of present illness.   All other ROS reviewed and negative.     Physical Exam/Data:   Vitals:   06/09/22 0630 06/09/22 0700 06/09/22 0722 06/09/22 0730  BP: (!) 101/58 (!) 154/74  (!) 113/59  Pulse: (!) 40 (!) 58  (!) 41  Resp: '10 16  12  '$ Temp:   (!) 97.5 F (36.4 C)   TempSrc:   Oral   SpO2: 95% 100%  97%  Weight:      Height:        Intake/Output Summary (Last 24 hours) at 06/09/2022 0755 Last data filed at 06/09/2022 0322 Gross per 24 hour  Intake 500 ml  Output --  Net 500 ml      06/09/2022   12:29 AM 05/21/2022    9:15 AM 05/15/2022    8:17 AM  Last 3 Weights  Weight (lbs) 160 lb 15 oz 160 lb 160 lb 6.4 oz  Weight (kg) 73 kg 72.576 kg 72.757 kg     Body mass index is 24.83 kg/m.  General:  Well nourished, well developed, in no acute distress HEENT: normal Neck: no JVD Vascular: No carotid bruits; Distal pulses 2+ bilaterally Cardiac:  normal S1, S2; RR, bradycardia; + murmur  Lungs:  clear to auscultation bilaterally, no wheezing, rhonchi or rales  Abd: soft, nontender, no hepatomegaly  Ext: no edema Musculoskeletal:  No deformities, BUE and BLE strength normal and equal Skin: warm and dry  Neuro:  CNs 2-12 intact, no focal abnormalities noted Psych:  Normal affect   EKG:  The EKG was personally reviewed and demonstrates:  SB 41bpm, 1st degree AV block, PRI 258m, TWI diffuse Telemetry:  Telemetry was personally reviewed and demonstrates:  SB HR 30s-40s, PACs  Relevant CV Studies:  Echo intraoperative TEE [id number] Complications: No known complications during this procedure.  POST-OP IMPRESSIONS  _ Comments: LAA no longer visible following LAA clippng procedure.   PRE-OP FINDINGS   Left Ventricle: The left ventricle has normal systolic function, with an  ejection fraction of 60-65%. The cavity size was normal. There is no left  ventricular hypertrophy.    Right Ventricle: The right ventricle has normal  systolic function. The  cavity was normal. There is no increase in right ventricular wall  thickness.   Left Atrium: Left atrial size was normal in size. No left atrial/left  atrial appendage thrombus was detected.   Right Atrium: Right atrial size was normal in size.   Interatrial Septum: No atrial level shunt detected by color flow Doppler.   Pericardium: There is no evidence of pericardial effusion.   Mitral Valve: The mitral valve is normal in structure. Mitral valve  regurgitation is trivial by color flow Doppler. There is No evidence of  mitral stenosis.   Tricuspid Valve: The tricuspid valve was normal in structure. Tricuspid  valve regurgitation is trivial by color flow Doppler.   Aortic Valve: The aortic valve is tricuspid Aortic valve regurgitation is  trivial by color flow Doppler. There is mild stenosis of the aortic valve,  with a calculated valve area of 1.12 cm.  There is mild thickening and moderate calcification present on the aortic  valve right coronary, left coronary and non-coronary cusps with mildly  decreased mobility.   Pulmonic Valve: The pulmonic valve was normal in structure.  Pulmonic valve regurgitation is trivial by color flow Doppler.    Aorta: The aortic root, ascending aorta  and aortic arch are normal in size  and structure.  \   Echo 10/2021 1. Left ventricular ejection fraction, by estimation, is 70 to 75%. The  left ventricle has hyperdynamic function. The left ventricle has no  regional wall motion abnormalities. Left ventricular diastolic parameters  are consistent with Grade II diastolic  dysfunction (pseudonormalization). The average left ventricular global  longitudinal strain is -19.9 %. The global longitudinal strain is normal.   2. Right ventricular systolic function is normal. The right ventricular  size is normal. There is normal pulmonary artery systolic pressure.   3. Left atrial size was moderately dilated.   4. Right  atrial size was mildly dilated.   5. The mitral valve is grossly normal. Trivial mitral valve  regurgitation.   6. The aortic valve is functionally bicuspid. There is moderate  calcification of the aortic valve. Aortic valve regurgitation is trivial.  Moderate aortic valve stenosis. Aortic regurgitation PHT measures 1914  msec. Aortic valve mean gradient measures  24.3 mmHg. Aortic valve Vmax measures 3.18 m/s. Dimentionless index 0.36.   7. The inferior vena cava is normal in size with greater than 50%  respiratory variability, suggesting right atrial pressure of 3 mmHg.   Comparison(s): Prior images reviewed side by side. Aortic stenosis still  in moderate range, but increased mean gradient from 10 to 24 mmHg  consistent with progression.   Myoview Lexiscan 11/2020    The study is normal. The study is low risk. There are no perfusion defects consistent with prior infarct or current ischemia.   1.0 mm of horizontal ST depression in the inferior leads (II, III and aVF) was noted.   Defect 1: There is a medium defect with moderate reduction in uptake present in the apical to basal inferior location(s). The defect is most intense in the resting images most consistent with subdiaphragmatic attenuation.   Left ventricular function is normal.   Laboratory Data:  High Sensitivity Troponin:   Recent Labs  Lab 06/09/22 0048 06/09/22 0233  TROPONINIHS 114* 112*     Chemistry Recent Labs  Lab 06/03/22 1059 06/09/22 0048 06/09/22 0436  NA 139 135 136  K 4.6 4.3 4.0  CL 105 102 105  CO2 24 20* 20*  GLUCOSE 93 229* 131*  BUN 44* 62* 61*  CREATININE 1.25* 2.23* 1.98*  CALCIUM 9.8 8.9 8.8*  MG 1.9  --  2.3  GFRNONAA  --  28* 32*  ANIONGAP  --  13 11    Recent Labs  Lab 06/09/22 0048 06/09/22 0436  PROT 6.6 6.7  ALBUMIN 3.5 3.4*  AST 63* 104*  ALT 57* 87*  ALKPHOS 55 60  BILITOT 0.9 0.5   Lipids No results for input(s): "CHOL", "TRIG", "HDL", "LABVLDL", "LDLCALC",  "CHOLHDL" in the last 168 hours.  Hematology Recent Labs  Lab 06/09/22 0048 06/09/22 0436  WBC 11.2* 9.1  RBC 3.26* 3.35*  HGB 10.2* 10.4*  HCT 31.2* 32.4*  MCV 95.7 96.7  MCH 31.3 31.0  MCHC 32.7 32.1  RDW 13.9 14.0  PLT 175 169   Thyroid  Recent Labs  Lab 06/09/22 0054  TSH 2.399    BNPNo results for input(s): "BNP", "PROBNP" in the last 168 hours.  DDimer No results for input(s): "DDIMER" in the last 168 hours.   Radiology/Studies:  [facility] Chest Port 1 View  Result Date: 06/09/2022 CLINICAL DATA:  Bradycardia. Fall. History of a fib and hypertension. EXAM: PORTABLE CHEST 1 VIEW COMPARISON:  04/30/2022. FINDINGS: The heart size  and mediastinal contours are within normal limits. A left atrial appendage clip is noted. There is atherosclerotic calcification of the aorta. No consolidation, effusion, or pneumothorax. No acute osseous abnormality. IMPRESSION: No active disease. Electronically Signed   By: Brett Fairy M.D.   On: 06/09/2022 01:39     Assessment and Plan:   Sinus bradycardia Afib s/p LAA clip - presented with generalized weakness, prior fall found to have AKI and sinus bradycardia with HR lower 30s, administered IVF and atropine 0.'5mg'$  x 1.  - Has a long history of afib with multiple cardioversion and was on amiodarone. - s/p recent LAA clip - not on a/c, thought to be poor candidate - agree with holding amiodarone - HR has been low in the past, into the 40s - Mag 2.3 and K 4.0 - Tele shows SB with HR in the 40s - saw EP recently, may need to consider other options if symptomatic sinus bradycardia develops. Monitor tele for now. Will discuss with MD.  Sinus node dysfunction - SB on amiodarone, this was held. Continue to monitor HR on tele  Generalized weakness - likely multifactorial given AKI, diarrhea, sinus bradycardia, general deconditioning, Myasthenia gravis  AKI - PTA losartan, diclofenac and chlorthalidone held - s/p IVF - trend kidney  function  Elevated troponin - no chest pain reported - HS trop elevated to 114 - stress test in 2022 was low risk with no perfusion defects of ischemia - intraoperative TEE 04/23/22 showed LVEF 60-65%  - suspect mostly supply demand mismatch. Can consider ischemic work-up as OP.  HTN - BP wnl - PTA Losartan and chlorthalidone held for AKI   For questions or updates, please contact [place] Please consult [email or web address] for contact info under    Signed, Cadence Arlyss Repress  06/09/2022 7:55 AM   Attending note Patient seen and discussed with PA Kathlen Mody, I agree with her documentation. 88 yo male history of aflutter on amio not on anticoag due to prior thorascopic appendage closure Jan 2024, HTN, mod aortic stenosis, HL, sinus node dysfunction watchful waiting, presents with weakness and dizziness.  Presents with episode of presyncope at home. Was laying in bed, felt nauseous and the urge to have a bowel movement. Stood up from bed to go to bathroom and had sudden severe dizzienss and weakness, came down to the ground. Eventually got up and went to have a BM. Back in bed, had to have another BM, loose and watery. Some recurrent dizziness/weakness.    ER vitals: p 96 bp 101/53 WBC 11.2 Hgb 10.2 Plt 175 K 4.2 Cr 2.23 Trop 114-->112 EKG sinus brady 41, diffuse ST depressions chronic CXR no acute process RLE venous US: No DVT Abd [place]: Generalized mild heterogeneous echogenicity in the liver. This is mild and nonspecific. Consider serologic hepatitis panel to exclude chronic hepatitis as a potential cause. The fairly recent chest CT from 04/16/2022 did not demonstrate substantial hypodensity of the visualized liver to suggest diffuse steatosis. No hydronephrosis.    10/2021 echo: LVEF 70-75%, no WMAs, grade II dd, mod AS    1.Dizziness/presyncope - most likely orthostatic symptoms given occurred after standing and evidence of significant dehydration/AKI on admission.  With nausea, upset stomach, multiple rounds of diarrhea may have been a vagal component as well.  -some sinus brady 30s to 40s, unclear how much of a role in the episode. On amio at home currently on hold. Loletha Grayer is a reported adverse effect of pyridostigmine he is on for myasthenia gravis.  -  HRs currently 50s to 70s, he checks at home regularly and similar range.   -I think less likely bradycardia was primary issue, more likely orthostasis and possible vasovagal symptoms.  - monitor tele today, likely d/c with home monitor - I don't see indication for inpatient EP eval at this point.    2. AKI - Cr 2.23 and BUN 62 on admission, last week Cr 1.25 and BUN 44 - Cr trending down slightly after slight fluids - home chlorthalidone, losartan held    3. Elevated troponin - difficult to interpret in the absence of clinical context - mild flat trop elevation in  - perhaps some transient hypotension after event - no plans for ischemic testing.    3.Aflutter - - multiple cardioversions within the last several months - did not tolerate av nodal agents due to bradycardia - started on amio 02/2022 after most recent DCCV in ER, since that time maintaining SR s/p thorascopic appendage closure, off DOACs.  - amio on hold, would restart likely tomowwor  at lower dose '100mg'$  daily. Had a very difficult time getting and keeping him in SR in the past  5. Ankle fracture - minimally displaced right ankle fracture   Carlyle Dolly MD

## 2022-06-09 NOTE — ED Notes (Signed)
Noticed patients right ankle is swollen.  Patient reports it is painful to stand on or move. MD notified to order xray.

## 2022-06-09 NOTE — ED Notes (Signed)
Pt received lunch tray 

## 2022-06-09 NOTE — Patient Outreach (Signed)
  Care Coordination   06/09/2022 Name: Kyle [name]. MRN: [id number] DOB: [birth year]   Care Coordination Outreach Attempts:  An unsuccessful telephone outreach was attempted for a scheduled appointment today.  Follow Up Plan:  Additional outreach attempts will be made to offer the patient care coordination information and services.   Encounter Outcome:  No Answer. Patient currently at [facility] ED with plans for admission. Transported by EMS for symptomatic bradycardia.   Care Coordination Interventions:  No, not indicated    Chong Sicilian, BSN, RN-BC RN Care Coordinator [place] Direct Dial: [phone] Main #: [phone]

## 2022-06-09 NOTE — Plan of Care (Signed)
  Problem: Acute Rehab PT Goals(only PT should resolve) Goal: Pt Will Go Supine/Side To Sit Outcome: Progressing Flowsheets (Taken 06/09/2022 1420) Pt will go Supine/Side to Sit: with modified independence Goal: Patient Will Transfer Sit To/From Stand Outcome: Progressing Flowsheets (Taken 06/09/2022 1420) Patient will transfer sit to/from stand: with modified independence Goal: Pt Will Transfer Bed To Chair/Chair To Bed Outcome: Progressing Flowsheets (Taken 06/09/2022 1420) Pt will Transfer Bed to Chair/Chair to Bed: with modified independence Goal: Pt Will Ambulate Outcome: Progressing Flowsheets (Taken 06/09/2022 1420) Pt will Ambulate:  > 125 feet  with modified independence  with rolling walker   2:20 PM, 06/09/22 Lonell Grandchild, MPT Physical Therapist with [facility] [phone] office [phone] mobile phone

## 2022-06-09 NOTE — ED Notes (Signed)
Provider was informed that the patient has a heart rate in the 30s.

## 2022-06-09 NOTE — ED Triage Notes (Addendum)
[facility] after pt had fallen @ home. Pt with generalized weakness that started tonight. EMS states HR upon their arrival was 52 and BP was 97/50. Pt takes pain medicine and pupils are 84m but reactive.

## 2022-06-09 NOTE — Progress Notes (Addendum)
PROGRESS NOTE  Kyle [name]. [id number] DOB: May 10, 1934 DOA: 06/09/2022 PCP: Redmond School, MD  Brief History:  88 year old male with a history of perforated diverticulitis, hypertension, hyperlipidemia, ocular myasthenia, provoked DVT not on anticoagulation, seizure disorder, atrial flutter status post multiple DCCV's, sinus node dysfunction (sinus brady), moderate aortic stenosis, nontoxic multinodular goiter presenting with generalized weakness and orthostasis type symptoms that began on 06/07/2022.  The patient had an episode of loose stool on 06/07/2022 and once again on 06/08/2022.  He denies any hematochezia or melena.  He took some antimotility medicine with some improvement.  He got up to use the bathroom in the evening of 06/08/2022 and felt lightheadedness and generalized weakness.  He tried to get up again, but was unable to stand secondary to generalized weakness and sat on the floor.  Subsequently, EMS was activated.  The patient denies any fevers, chills, headache, focal extremity weakness, chest pain, shortness breath, coughing, hemoptysis, nausea, vomiting, abdominal pain.  He denies any dysuria or hematuria.  Review of the medical record shows that the patient recently underwent LAA clipping performed by Dr. Tenny Craw on 04/20/2022.  Since then, the patient has resumed taking his diclofenac tablets twice daily.  In addition, the patient was recently started on chlorthalidone for his elevated blood pressure on 05/12/2022.  His amlodipine was discontinued on that day secondary to lower extremity edema.  He continues on losartan. He went to see Dr. Cristopher Peru on 05/14/2022 secondary to his ongoing bradycardia despite decreasing his metoprolol.  He was felt to have sinus node dysfunction.  Plan was for close observation.  In the ED, the patient was afebrile with soft blood pressures, SBP low 100s, upper 90s.  He was bradycardic into the 30s while asleep and in the 40s when awake.   Oxygen saturation was 100% room air.  WBC 11.2, hemoglobin 10.2, platelets 175,000.  Sodium 135, potassium 4.3, bicarbonate 20, serum creatinine 2.23.  The patient was started on IV fluids.  Cardiology was consulted to assist with management for possible symptomatic bradycardia.   Assessment/Plan: Generalized weakness -Multifactorial, but primarily driven by the patient's AKI, volume depletion -Concern about a component of symptomatic bradycardia -B12 -TSH 2.399 -Obtain UA -PT evaluation -CK 54  AKI -Presented with serum creatinine 2.23 -Baseline creatinine 0.8-1.1 -Secondary to volume depletion in the setting of chlorthalidone, losartan, and diclofenac usage -Holding chlorthalidone, losartan, diclofenac for now  Sinus bradycardia -Cardiology consult -saw Dr. Cristopher Peru 05/14/22 -Remain on telemetry  Elevated troponin -Secondary to demand ischemia -No chest pain presently -Personally reviewed EKG--sinus bradycardia, diffuse T wave inversions, unchanged -10/15/2021 echo EF 70 to 75%, no WMA, grade 2 DD, moderate AS  Atrial flutter -Status post LAA clipping 04/20/22 -no longer on [facility] -s/p multiple DCCVs, most recent 03/01/22 -currently in sinus  Ocular myasthenia -Continue Mestinon -follow up Dr. Krista Blue  Seizure disorder -Continue Keppra  Mixed hyperlipidemia -Continue statin  Severe OA/opioid dependence -Continue home dose of hydrocodone -PDMP reviewed--patient takes hydrocodone 5/325, #90, last refill 06/01/2022  Essential hypertension -Blood pressure soft -Holding chlorthalidone, losartan  Right Fibular Fracture -2/27 xray--minimally displaced oblique fracture distal fibular diaphysis at level of syndesmosis -case discussed with ortho, Dr. Corene Cornea Rogers>>nonoperative management -pt can follow up in office 2 weeks after d/c -placed CAM boot on pt>>WBAT when wearing boot -PT eval       Family Communication:   daughter updated 2/27  Consultants:   cardiology  Code Status:  FULL  DVT Prophylaxis:  [place] Heparin    Procedures: As Listed in Progress Note Above  Antibiotics: None   Total time spent 50 minutes.  Greater than 50% spent face to face counseling and coordinating care.   Subjective: Patient denies fevers, chills, headache, chest pain, dyspnea, nausea, vomiting, abdominal pain, dysuria, hematuria, hematochezia, and melena.   Objective: Vitals:   06/09/22 0530 06/09/22 0600 06/09/22 0630 06/09/22 0700  BP: (!) 104/53 (!) 96/52 (!) 101/58 (!) 154/74  Pulse: (!) 38 (!) 38 (!) 40 (!) 58  Resp: '10 11 10 16  '$ Temp:      TempSrc:      SpO2: 99% 99% 95% 100%  Weight:      Height:        Intake/Output Summary (Last 24 hours) at 06/09/2022 0717 Last data filed at 06/09/2022 0322 Gross per 24 hour  Intake 500 ml  Output --  Net 500 ml   Weight change:  Exam:  General:  Pt is alert, follows commands appropriately, not in acute distress HEENT: No icterus, No thrush, No neck mass, /AT Cardiovascular: RRR, S1/S2, no rubs, no gallops Respiratory: CTA bilaterally, no wheezing, no crackles, no rhonchi Abdomen: Soft/+BS, non tender, non distended, no guarding Extremities: mild edema right ankle, No lymphangitis, No petechiae, No rashes, no synovitis   Data Reviewed: I have personally reviewed following labs and imaging studies Basic Metabolic Panel: Recent Labs  Lab 06/03/22 1059 06/09/22 0048 06/09/22 0436  NA 139 135 136  K 4.6 4.3 4.0  CL 105 102 105  CO2 24 20* 20*  GLUCOSE 93 229* 131*  BUN 44* 62* 61*  CREATININE 1.25* 2.23* 1.98*  CALCIUM 9.8 8.9 8.8*  MG 1.9  --   --    Liver Function Tests: Recent Labs  Lab 06/09/22 0048 06/09/22 0436  AST 63* 104*  ALT 57* 87*  ALKPHOS 55 60  BILITOT 0.9 0.5  PROT 6.6 6.7  ALBUMIN 3.5 3.4*   No results for input(s): "LIPASE", "AMYLASE" in the last 168 hours. No results for input(s): "AMMONIA" in the last 168 hours. Coagulation Profile: No results  for input(s): "INR", "PROTIME" in the last 168 hours. CBC: Recent Labs  Lab 06/09/22 0048 06/09/22 0436  WBC 11.2* 9.1  HGB 10.2* 10.4*  HCT 31.2* 32.4*  MCV 95.7 96.7  PLT 175 169   Cardiac Enzymes: Recent Labs  Lab 06/09/22 0436  CKTOTAL 54   BNP: Invalid input(s): "POCBNP" CBG: No results for input(s): "GLUCAP" in the last 168 hours. HbA1C: No results for input(s): "HGBA1C" in the last 72 hours. Urine analysis:    Component Value Date/Time   COLORURINE YELLOW 04/16/2022 [place] 04/16/2022 1053   LABSPEC 1.014 04/16/2022 1053   PHURINE 7.0 04/16/2022 1053   GLUCOSEU NEGATIVE 04/16/2022 1053   HGBUR NEGATIVE 04/16/2022 [place] 04/16/2022 [place] 04/16/2022 1053   PROTEINUR NEGATIVE 04/16/2022 1053   NITRITE NEGATIVE 04/16/2022 1053   LEUKOCYTESUR NEGATIVE 04/16/2022 1053   Sepsis Labs: '@LABRCNTIP'$ (procalcitonin:4,lacticidven:4) )No results found for this or any previous visit (from the past 240 hour(s)).   Scheduled Meds:  heparin  5,000 Units Subcutaneous Q8H   insulin aspart  0-5 Units Subcutaneous QHS   insulin aspart  0-6 Units Subcutaneous TID WC   levETIRAcetam  250 mg Oral Daily   levETIRAcetam  500 mg Oral QHS   pyridostigmine  90 mg Oral TID   simvastatin  10 mg Oral QHS  sodium chloride flush  3 mL Intravenous Q12H   Continuous Infusions:  sodium chloride 125 mL/hr at 06/09/22 0321    Procedures/Studies: DG Chest Port 1 View  Result Date: 06/09/2022 CLINICAL DATA:  Bradycardia. Fall. History of a fib and hypertension. EXAM: PORTABLE CHEST 1 VIEW COMPARISON:  04/30/2022. FINDINGS: The heart size and mediastinal contours are within normal limits. A left atrial appendage clip is noted. There is atherosclerotic calcification of the aorta. No consolidation, effusion, or pneumothorax. No acute osseous abnormality. IMPRESSION: No active disease. Electronically Signed   By: Brett Fairy M.D.   On:  06/09/2022 01:39    Orson Eva, DO  [facility]  If 7PM-7AM, please contact night-coverage [email or web address] Password [facility] 06/09/2022, 7:17 AM   LOS: 0 days

## 2022-06-09 NOTE — Progress Notes (Addendum)
  Transition of Care [facility]) Screening Note   Patient Details  Name: Kyle [name]. Date of Birth: Apr 01, 1935   Transition of Care [facility]) CM/SW Contact:    Iona Beard, [place] Phone Number: 06/09/2022, 9:56 AM    Transition of Care Department [facility]) has reviewed patient and no TOC needs have been identified at this time. We will continue to monitor patient advancement through interdisciplinary progression rounds. If new patient transition needs arise, please place a TOC consult.

## 2022-06-09 NOTE — H&P (Signed)
History and Physical    [name]. [id number] DOB: 1934/08/24 DOA: 06/09/2022  PCP: Redmond School, MD   Patient coming from: Home   Chief Complaint: Generalized weakness, lightheaded on standing   HPI: Rylann Cristina. is a pleasant 88 y.o. male with medical history significant for hypertension, atrial fibrillation not anticoagulated, seizures, ocular myasthenia gravis, chronic pain, and sinus node dysfunction followed by EP who presents to the emergency department with generalized weakness and lightheadedness on standing.  Patient reports he was having diarrhea on 06/07/2022 and took an antidiarrheal medicine with some improvement.  He felt fairly well when he went to bed last night but woke to move his bowels and had lightheadedness when he got up and then felt generally weak.  He tried to get up again, but became acutely lightheaded and was unable to stand due to generalized weakness.   He has ocular myasthenia gravis which has been stable, and his neurologist decreased his Mestinon dose few months ago.  He develops diplopia with exacerbations, but has not experienced that recently.  He denies any chest pain, fevers, or chills.  ED Course: Upon arrival to the ED, patient is found to be afebrile and saturating well on room air with low heart rate and systolic blood pressure of 101 and greater.  EKG demonstrates sinus bradycardia 341 and first-degree AV nodal block.  Chest x-ray negative for acute cardiopulmonary disease.  Labs most notable for glucose 229, BUN 62, creatinine 2.23, AST 63, ALT 57, and troponin 114.   Patient was treated in the emergency department with 500 mL of saline, 0.5 mg atropine, and Zofran.  Review of Systems:  All other systems reviewed and apart from HPI, are negative.  Past Medical History:  Diagnosis Date   Arthritis    Atrial fibrillation (HCC)    Auto immune neutropenia (HCC)    Bullous pemphigoid    Cancer (HCC)    Prostate   Cervical  spondylosis without myelopathy 01/22/2016   Chronic kidney disease    Per MD note 05/2021 CKD stage non specified   Complication of anesthesia    difficult to wake up after anesthesia   DVT (deep venous thrombosis) ([place])    a. Has had a previous right lower extremity DVT in the setting of hospitalization and surgery (after his brain tumor was resected in 1993) but is no longer on Coumadin.   Dyslipidemia    Dysrhythmia    History of kidney stones    History of prostate cancer    Hypertension    Meningioma ([place])    a. s/p resection 1990s.   Myasthenia ([place]) 02/14/2013   Nocturnal leg cramps    Ocular myasthenia gravis ([place])    Peripheral neuropathy 11/23/2018   Seizures ([place])    Sinus bradycardia    Sleep paralysis    Thyroid nodule, cold     Past Surgical History:  Procedure Laterality Date   BLADDER SURGERY     removed part of the bladder   BRAIN SURGERY     CARDIOVERSION N/A 11/07/2020   Procedure: CARDIOVERSION;  Surgeon: Arnoldo Lenis, MD;  Location: [facility];  Service: Endoscopy;  Laterality: N/A;   CARDIOVERSION N/A 11/20/2021   Procedure: CARDIOVERSION;  Surgeon: Arnoldo Lenis, MD;  Location: [facility];  Service: Endoscopy;  Laterality: N/A;   CHOLECYSTECTOMY     CLIPPING OF ATRIAL APPENDAGE N/A 04/20/2022   Procedure: CLIPPING OF ATRIAL APPENDAGE USING 45 ATRIAL CLIP;  Surgeon: Neomia Glass,  MD;  Location: [facility];  Service: Open Heart Surgery;  Laterality: N/A;   COLONOSCOPY N/A 07/25/2013   Procedure: COLONOSCOPY;  Surgeon: Jamesetta So, MD;  Location: [facility];  Service: Gastroenterology;  Laterality: N/A;   COLONOSCOPY N/A 11/10/2016   Procedure: COLONOSCOPY;  Surgeon: Aviva Signs, MD;  Location: [facility];  Service: Gastroenterology;  Laterality: N/A;   IR CATHETER TUBE CHANGE  01/02/2017   IR RADIOLOGIST EVAL & MGMT  01/06/2017   IR RADIOLOGIST EVAL & MGMT  01/20/2017   IR RADIOLOGIST EVAL & MGMT  02/03/2017   IR RADIOLOGIST EVAL & MGMT   02/17/2017   IR SINUS/FIST TUBE CHK-NON GI  03/03/2017   PARTIAL COLECTOMY     RADIOACTIVE SEED IMPLANT     TEE WITHOUT CARDIOVERSION N/A 04/20/2022   Procedure: TRANSESOPHAGEAL ECHOCARDIOGRAM (TEE);  Surgeon: Neomia Glass, MD;  Location: [place];  Service: Open Heart Surgery;  Laterality: N/A;   VIDEO ASSISTED THORACOSCOPY Left 04/20/2022   Procedure: VIDEO ASSISTED THORACOSCOPY;  Surgeon: Neomia Glass, MD;  Location: [place];  Service: Thoracic;  Laterality: Left;    Social History:   reports that he has never smoked. He has never been exposed to tobacco smoke. He has never used smokeless tobacco. He reports that he does not drink alcohol and does not use drugs.  Allergies  Allergen Reactions   Iodinated Contrast Media Hives     pt was premedicated/hives reaction occurred 24 hours post injection, Onset Date: [id number]    Sulfa Antibiotics Hives, Swelling and Rash   Bactericin [Bacitracin] Other (See Comments)    Unknown reaction    Norvasc [Amlodipine] Swelling    Leg swelling    Family History  Problem Relation Age of Onset   Heart failure Mother    Diabetes Mother    Diabetes Sister    Dementia Sister    Lung cancer Son    Heart failure Daughter    Colon cancer Neg Hx      Prior to Admission medications   Medication Sig Start Date End Date Taking? Authorizing Provider  amiodarone (PACERONE) 200 MG tablet Take 1 tablet (200 mg total) by mouth daily. 05/12/22 05/07/23  Arnoldo Lenis, MD  chlorthalidone (HYGROTON) 25 MG tablet Take 0.5 tablets (12.5 mg total) by mouth daily. 05/12/22   Arnoldo Lenis, MD  cholecalciferol (VITAMIN D3) 25 MCG (1000 UNIT) tablet Take 1,000 Units by mouth daily.    [provider]  clobetasol (TEMOVATE) 0.05 % external solution Apply 1 Application topically 2 (two) times daily as needed (blisters).    [provider]  clobetasol cream (TEMOVATE) [id number] % Apply 1 application  topically 2 (two) times daily as needed  (blisters).    [provider]  diclofenac (VOLTAREN) 75 MG EC tablet Take 75 mg by mouth 2 (two) times daily. 04/24/22   [provider]  fish oil-omega-3 fatty acids 1000 MG capsule Take 1 g by mouth 4 (four) times daily.    [provider]  gabapentin (NEURONTIN) 600 MG tablet Take 1 tablet (600 mg total) by mouth daily. Patient taking differently: Take 600 mg by mouth at bedtime. 12/29/21   Marcial Pacas, MD  Garlic (GARLIQUE PO) Take 1 tablet by mouth daily.    [provider]  HYDROcodone-acetaminophen (NORCO/VICODIN) 5-325 MG tablet Take 1 tablet by mouth 2 (two) times daily. 11/06/21   [provider]  levETIRAcetam (KEPPRA) 500 MG tablet Take 1/2 tablet in the morning, take  1 tablet in the evening 12/29/21   Marcial Pacas, MD  losartan (COZAAR) 50 MG tablet Take 1 tablet (50 mg total) by mouth in the morning and at bedtime. 03/25/22 03/20/23  Arnoldo Lenis, MD  niacinamide 500 MG tablet Take 500 mg by mouth 3 (three) times daily.    [provider]  oxymetazoline (AFRIN) 0.05 % nasal spray Place 1 spray into both nostrils 2 (two) times daily as needed for congestion.    [provider]  Polyethyl Glycol-Propyl Glycol 0.4-0.3 % SOLN Place 1 drop into both eyes 3 (three) times daily.    [provider]  pyridostigmine (MESTINON) 60 MG tablet Take 90 mg by mouth 3 (three) times daily.    [provider]  simvastatin (ZOCOR) 20 MG tablet Take 10 mg by mouth at bedtime. 10/24/21   [provider]  tiZANidine (ZANAFLEX) 4 MG tablet Take 4 mg by mouth at bedtime. 07/19/20   [provider]    Physical Exam: Vitals:   06/09/22 0029 06/09/22 0031 06/09/22 0035 06/09/22 0130  BP:  (!) 101/53  105/63  Pulse:  96 (!) 48 (!) 43  Resp:  14  10  Temp:  (!) 96.5 F (35.8 C)    TempSrc:  Rectal    SpO2:  97%  96%  Weight: 73 kg     Height: 5' 7.5" (1.715 m)        Constitutional: NAD, no pallor or  diaphoresis  Eyes: PERTLA, lids and conjunctivae normal ENMT: Mucous membranes are moist. Posterior pharynx clear of any exudate or lesions.   Neck: supple, no masses  Respiratory: no wheezing, no crackles. No accessory muscle use.  Cardiovascular: Rate ~45 and regular. RLE edema.   Abdomen: No distension, no tenderness, soft. Bowel sounds active.  Musculoskeletal: no clubbing / cyanosis. No joint deformity upper and lower extremities.   Skin: no significant rashes, lesions, ulcers. Warm, dry, well-perfused. Neurologic: Hearing deficit, CN 2-12 grossly intact otherwise. Moving all extremities. Alert and oriented to person, place, and situation.  Psychiatric: Calm. Cooperative.    Labs and Imaging on Admission: I have personally reviewed following labs and imaging studies  CBC: Recent Labs  Lab 06/09/22 0048  WBC 11.2*  HGB 10.2*  HCT 31.2*  MCV 95.7  PLT 0000000   Basic Metabolic Panel: Recent Labs  Lab 06/03/22 1059 06/09/22 0048  NA 139 135  K 4.6 4.3  CL 105 102  CO2 24 20*  GLUCOSE 93 229*  BUN 44* 62*  CREATININE 1.25* 2.23*  CALCIUM 9.8 8.9  MG 1.9  --    GFR: Estimated Creatinine Clearance: 23.1 mL/min (A) (by C-G formula based on SCr of 2.23 mg/dL (H)). Liver Function Tests: Recent Labs  Lab 06/09/22 0048  AST 63*  ALT 57*  ALKPHOS 55  BILITOT 0.9  PROT 6.6  ALBUMIN 3.5   No results for input(s): "LIPASE", "AMYLASE" in the last 168 hours. No results for input(s): "AMMONIA" in the last 168 hours. Coagulation Profile: No results for input(s): "INR", "PROTIME" in the last 168 hours. Cardiac Enzymes: No results for input(s): "CKTOTAL", "CKMB", "CKMBINDEX", "TROPONINI" in the last 168 hours. BNP (last 3 results) No results for input(s): "PROBNP" in the last 8760 hours. HbA1C: No results for input(s): "HGBA1C" in the last 72 hours. CBG: No results for input(s): "GLUCAP" in the last 168 hours. Lipid Profile: No results for input(s): "CHOL", "HDL",  "LDLCALC", "TRIG", "CHOLHDL", "LDLDIRECT" in the last 72 hours. Thyroid Function Tests:  No results for input(s): "TSH", "T4TOTAL", "FREET4", "T3FREE", "THYROIDAB" in the last 72 hours. Anemia Panel: No results for input(s): "VITAMINB12", "FOLATE", "FERRITIN", "TIBC", "IRON", "RETICCTPCT" in the last 72 hours. Urine analysis:    Component Value Date/Time   COLORURINE YELLOW 04/16/2022 [place] 04/16/2022 1053   LABSPEC 1.014 04/16/2022 1053   PHURINE 7.0 04/16/2022 1053   GLUCOSEU NEGATIVE 04/16/2022 1053   HGBUR NEGATIVE 04/16/2022 [place] 04/16/2022 [place] 04/16/2022 1053   PROTEINUR NEGATIVE 04/16/2022 1053   NITRITE NEGATIVE 04/16/2022 1053   LEUKOCYTESUR NEGATIVE 04/16/2022 1053   Sepsis Labs: '@LABRCNTIP'$ (procalcitonin:4,lacticidven:4) )No results found for this or any previous visit (from the past 240 hour(s)).   Radiological Exams on Admission: DG Chest Port 1 View  Result Date: 06/09/2022 CLINICAL DATA:  Bradycardia. Fall. History of a fib and hypertension. EXAM: PORTABLE CHEST 1 VIEW COMPARISON:  04/30/2022. FINDINGS: The heart size and mediastinal contours are within normal limits. A left atrial appendage clip is noted. There is atherosclerotic calcification of the aorta. No consolidation, effusion, or pneumothorax. No acute osseous abnormality. IMPRESSION: No active disease. Electronically Signed   By: Brett Fairy M.D.   On: 06/09/2022 01:39    EKG: Independently reviewed. Sinus bradycardia, rate 41, 1st degree AV block.   Assessment/Plan   1. AKI  - SCr is 2.23 on admission, up from 1.25 a few days earlier   - Likely prerenal in setting of recent diarrhea  - Check UA and renal US, hold losartan, chlorthalidone, and Voltaren, renally-dose medications, continue IVF hydration, and repeat chem panel in am    2. General weakness  - Not having diplopia as he gets with myasthenia exacerbations, and appeared  asymptomatic in ED with HR in 30s-40s - Possibly from continued gabapentin and Norco use with significantly reduced GFR  - Check CK, monitor while holding gabapentin, Norco, and tizanidine   3. Sinus bradycardia; atrial fibrillation  - HR dipping into 30s in ED, sinus brady with 1st deg AV block noted on EKG  - Toprol had previously been lowered and then discontinued d/t persistent bradycardia  - He saw EP earlier this month, is noted to have sinus node dysfunction, but PPM not indicated at that time  - He is not anticoagulated for his a fib but underwent LAA closure in January 2024  - Continue cardiac monitoring, hold Norco, hold amiodarone for now    4. Elevated troponin  - Troponin is 114 in ED without angina or acute EKG changes   - Continue cardiac monitoring, trend troponin, continue statin    5. Hypertension  - Hold losartan and chlorthalidone in light of AKI  - Treat as-needed only for now    6. Ocular myasthenia gravis  - No diplopia   - Continue Mestinon   7. Seizures  - Continue Keppra    8. Chronic pain  - Hold NSAID and Norco in light of acute renal failure, continue gabapentin with dose reduction    9. Hyperglycemia  - Serum glucose is 229 in ED  - Likely stress reaction vs new DM   - Check A1c, monitor CBGs, use low-intensity SSI if needed    10. Elevated transaminases  - AST 63 and ALT 57 on admission with normal alk phos and bili  - Check RUQ [place] and viral hepatitis panel, trend LFTs    DVT prophylaxis: sq heparin  Code Status: Full  Level of Care: Level of care: Stepdown Family Communication:  None present   Disposition Plan:  Patient is from: home  Anticipated d/c is to: TBD Anticipated d/c date is: 06/11/22 Patient currently: Pending improved/stable renal function, stable HR  Consults called: none  Admission status: Inpatient     Vianne Bulls, MD [facility]  06/09/2022, 2:44 AM

## 2022-06-09 NOTE — ED Notes (Signed)
Pt has been changed and placed in a clean brief. [place] is in placed as well. Warm blankets and socks were given. Pt resting at this time.

## 2022-06-09 NOTE — ED Notes (Signed)
Charge nurse on 300 aware pt will be brought up.

## 2022-06-09 NOTE — ED Notes (Signed)
Attempted report to receiving nurse

## 2022-06-09 NOTE — ED Provider Notes (Signed)
[facility] Emergency Department Provider Note MRN:  [id number]  Arrival date & time: 06/09/22     Chief Complaint   Fall   History of Present Illness   Kyle [name]. is a 88 y.o. year-old male with a history of A-fib, CKD presenting to the ED with chief complaint of fall.  Generalized weakness starting tonight.  Patient explains that he cannot stand up because of profound weakness and getting very lightheaded and nauseated when he tries to.  He was lowered to the ground, denies any traumatic injuries.  EMS reports a heart rate as low as 38 and route with a blood pressure of 97/50.  Review of Systems  A thorough review of systems was obtained and all systems are negative except as noted in the HPI and PMH.   Patient's Health History    Past Medical History:  Diagnosis Date   Arthritis    Atrial fibrillation (HCC)    Auto immune neutropenia (HCC)    Bullous pemphigoid    Cancer (HCC)    Prostate   Cervical spondylosis without myelopathy 01/22/2016   Chronic kidney disease    Per MD note 05/2021 CKD stage non specified   Complication of anesthesia    difficult to wake up after anesthesia   DVT (deep venous thrombosis) ([place])    a. Has had a previous right lower extremity DVT in the setting of hospitalization and surgery (after his brain tumor was resected in 1993) but is no longer on Coumadin.   Dyslipidemia    Dysrhythmia    History of kidney stones    History of prostate cancer    Hypertension    Meningioma ([place])    a. s/p resection 1990s.   Myasthenia ([place]) 02/14/2013   Nocturnal leg cramps    Ocular myasthenia gravis ([place])    Peripheral neuropathy 11/23/2018   Seizures ([place])    Sinus bradycardia    Sleep paralysis    Thyroid nodule, cold     Past Surgical History:  Procedure Laterality Date   BLADDER SURGERY     removed part of the bladder   BRAIN SURGERY     CARDIOVERSION N/A 11/07/2020   Procedure: CARDIOVERSION;  Surgeon:  Arnoldo Lenis, MD;  Location: [facility];  Service: Endoscopy;  Laterality: N/A;   CARDIOVERSION N/A 11/20/2021   Procedure: CARDIOVERSION;  Surgeon: Arnoldo Lenis, MD;  Location: [facility];  Service: Endoscopy;  Laterality: N/A;   CHOLECYSTECTOMY     CLIPPING OF ATRIAL APPENDAGE N/A 04/20/2022   Procedure: CLIPPING OF ATRIAL APPENDAGE USING 45 ATRIAL CLIP;  Surgeon: Neomia Glass, MD;  Location: [facility];  Service: Open Heart Surgery;  Laterality: N/A;   COLONOSCOPY N/A 07/25/2013   Procedure: COLONOSCOPY;  Surgeon: Jamesetta So, MD;  Location[facility];  Service: Gastroenterology;  Laterality: N/A;   COLONOSCOPY N/A 11/10/2016   Procedure: COLONOSCOPY;  Surgeon: Aviva Signs, MD;  [facility];  Service: Gastroenterology;  Laterality: N/A;   IR CATHETER TUBE CHANGE  01/02/2017   IR RADIOLOGIST EVAL & MGMT  01/06/2017   IR RADIOLOGIST EVAL & MGMT  01/20/2017   IR RADIOLOGIST EVAL & MGMT  02/03/2017   IR RADIOLOGIST EVAL & MGMT  02/17/2017   IR SINUS/FIST TUBE CHK-NON GI  03/03/2017   PARTIAL COLECTOMY     RADIOACTIVE SEED IMPLANT     TEE WITHOUT CARDIOVERSION N/A 04/20/2022   Procedure: TRANSESOPHAGEAL ECHOCARDIOGRAM (TEE);  Surgeon: Neomia Glass, MD;  Location: [facility];  Service: Open Heart Surgery;  Laterality: N/A;   VIDEO ASSISTED THORACOSCOPY Left 04/20/2022   Procedure: VIDEO ASSISTED THORACOSCOPY;  Surgeon: Neomia Glass, MD;  Location: [facility] OR;  Service: Thoracic;  Laterality: Left;    Family History  Problem Relation Age of Onset   Heart failure Mother    Diabetes Mother    Diabetes Sister    Dementia Sister    Lung cancer Son    Heart failure Daughter    Colon cancer Neg Hx     Social History   Socioeconomic History   Marital status: Married    Spouse name: Banker   Number of children: 1   Years of education: 33 TH   Highest education level: Not on file  Occupational History   Occupation: retired  Tobacco Use   Smoking status: Never    Passive  exposure: Never   Smokeless tobacco: Never  Vaping Use   Vaping Use: Never used  Substance and Sexual Activity   Alcohol use: No   Drug use: No   Sexual activity: Not on file  Other Topics Concern   Not on file  Social History Narrative   Patient is right handed.   Patient drinks 1 cup of coffee daily.   Social Determinants of Health   Financial Resource Strain: Low Risk  (04/24/2022)   Overall Financial Resource Strain (CARDIA)    Difficulty of Paying Living Expenses: Not hard at all  Food Insecurity: No Food Insecurity (04/20/2022)   Hunger Vital Sign    Worried About Running Out of Food in the Last Year: Never true    Ran Out of Food in the Last Year: Never true  Transportation Needs: No Transportation Needs (04/24/2022)   PRAPARE - Hydrologist (Medical): No    Lack of Transportation (Non-Medical): No  Physical Activity: Not on file  Stress: Not on file  Social Connections: Not on file  Intimate Partner Violence: Not At Risk (04/20/2022)   Humiliation, Afraid, Rape, and Kick questionnaire    Fear of Current or Ex-Partner: No    Emotionally Abused: No    Physically Abused: No    Sexually Abused: No     Physical Exam   Vitals:   06/09/22 0035 06/09/22 0130  BP:  105/63  Pulse: (!) 48 (!) 43  Resp:  10  Temp:    SpO2:  96%    CONSTITUTIONAL: Well-appearing, NAD NEURO/PSYCH:  Alert and oriented x 3, no focal deficits EYES:  eyes equal and reactive ENT/NECK:  no LAD, no JVD CARDIO: Bradycardic rate, well-perfused, normal S1 and S2 PULM:  CTAB no wheezing or rhonchi GI/GU:  non-distended, non-tender MSK/SPINE:  No gross deformities, no edema SKIN:  no rash, atraumatic   *Additional and/or pertinent findings included in MDM below  Diagnostic and Interventional Summary    EKG Interpretation  Date/Time:  Tuesday June 09 2022 00:29:44 EST Ventricular Rate:  41 PR Interval:  264 QRS Duration: 102 QT Interval:  574 QTC  Calculation: 474 R Axis:   56 Text Interpretation: Sinus bradycardia Prolonged PR interval Abnormal T, consider ischemia, diffuse leads Baseline wander in lead(s) V1 Confirmed by [name] [id number]) on 06/09/2022 12:42:01 AM       Labs Reviewed  CBC - Abnormal; Notable for the following components:      Result Value   WBC 11.2 (*)    RBC 3.26 (*)    Hemoglobin 10.2 (*)  HCT 31.2 (*)    All other components within normal limits  COMPREHENSIVE METABOLIC PANEL - Abnormal; Notable for the following components:   CO2 20 (*)    Glucose, Bld 229 (*)    BUN 62 (*)    Creatinine, Ser 2.23 (*)    AST 63 (*)    ALT 57 (*)    GFR, Estimated 28 (*)    All other components within normal limits  TROPONIN I (HIGH SENSITIVITY) - Abnormal; Notable for the following components:   Troponin I (High Sensitivity) 114 (*)    All other components within normal limits  TSH  URINALYSIS, ROUTINE W REFLEX MICROSCOPIC    DG Chest Port 1 View  Final Result      Medications  sodium chloride 0.9 % bolus 500 mL (has no administration in time range)  ondansetron (ZOFRAN) injection 4 mg (4 mg Intravenous Given 06/09/22 0051)  atropine 1 MG/10ML injection 0.5 mg (0.5 mg Intravenous Given 06/09/22 0051)     Procedures  /  Critical Care .Critical Care  Performed by: Maudie Flakes, MD Authorized by: Maudie Flakes, MD   Critical care provider statement:    Critical care time (minutes):  35   Critical care was necessary to treat or prevent imminent or life-threatening deterioration of the following conditions: Concern for symptomatic bradycardia.   Critical care was time spent personally by me on the following activities:  Development of treatment plan with patient or surrogate, discussions with consultants, evaluation of patient's response to treatment, examination of patient, ordering and review of laboratory studies, ordering and review of radiographic studies, ordering and performing treatments and  interventions, pulse oximetry, re-evaluation of patient's condition and review of old charts   ED Course and Medical Decision Making  Initial Impression and Ddx Concern for symptomatic bradycardia given patient's description of generalized profound weakness, lightheadedness with any standing.  On arrival his heart rate is about 40.  On EKG I see sinus bradycardia, no signs of heart block.  Soft blood pressure as well.  Loud systolic murmur on exam, has a documented history of aortic stenosis.  Past medical/surgical history that increases complexity of ED encounter: History of myasthenia gravis, DVT  Interpretation of Diagnostics I personally reviewed the EKG and my interpretation is as follows: Sinus bradycardia  Labs reveal acute kidney injury, troponin of 114 likely elevated in the setting of dehydration.  Not having any chest pain.  Patient Reassessment and Ultimate Disposition/Management     Will admit to medicine.  Patient management required discussion with the following services or consulting groups:  Hospitalist Service  Complexity of Problems Addressed Acute illness or injury that poses threat of life of bodily function  Additional Data Reviewed and Analyzed Further history obtained from: Further history from spouse/family member  Additional Factors Impacting ED Encounter Risk Consideration of hospitalization  Barth Kirks. [place] Small, [place] [email or web address]  Final Clinical Impressions(s) / ED Diagnoses     ICD-10-CM   1. Symptomatic bradycardia  R00.1     2. AKI (acute kidney injury) ([place])  N17.9     3. Weakness  R53.1     4. [place]       ED Discharge Orders     None        Discharge Instructions Discussed with and Provided to Patient:   Discharge Instructions   None      Maudie Flakes, MD 06/09/22 [phone]

## 2022-06-09 NOTE — Evaluation (Signed)
Physical Therapy Evaluation Patient Details Name: Kyle [name]. MRN: [id number] DOB: 04/01/1935 Today's Date: 06/09/2022  History of Present Illness  Kyle [name]. is a pleasant 88 y.o. male with medical history significant for hypertension, atrial fibrillation not anticoagulated, seizures, ocular myasthenia gravis, chronic pain, and sinus node dysfunction followed by EP who presents to the emergency department with generalized weakness and lightheadedness on standing.     Patient reports he was having diarrhea on 06/07/2022 and took an antidiarrheal medicine with some improvement.  He felt fairly well when he went to bed last night but woke to move his bowels and had lightheadedness when he got up and then felt generally weak.  He tried to get up again, but became acutely lightheaded and was unable to stand due to generalized weakness.      He has ocular myasthenia gravis which has been stable, and his neurologist decreased his Mestinon dose few months ago.  He develops diplopia with exacerbations, but has not experienced that recently.   Clinical Impression  Patient functioning near baseline for functional mobility and gait other than having to lean on armrest of chair during transfers without AD, required use of RW for gait training with good return for use ambulating in room and hallways without loss of balance. Patient tolerated sitting up in chair after therapy - RN notified.  Patient will benefit from continued skilled physical therapy in hospital and recommended venue below to increase strength, balance, endurance for safe ADLs and gait.          Recommendations for follow up therapy are one component of a multi-disciplinary discharge planning process, led by the attending physician.  Recommendations may be updated based on patient status, additional functional criteria and insurance authorization.  Follow Up Recommendations Home health PT      Assistance Recommended at Discharge  Set up Supervision/Assistance  Patient can return home with the following  A little help with walking and/or transfers;A little help with bathing/dressing/bathroom;Assistance with cooking/housework;Help with stairs or ramp for entrance    Equipment Recommendations None recommended by PT  Recommendations for Other Services       Functional Status Assessment Patient has had a recent decline in their functional status and demonstrates the ability to make significant improvements in function in a reasonable and predictable amount of time.     Precautions / Restrictions Precautions Precautions: Fall Restrictions Weight Bearing Restrictions: No      Mobility  Bed Mobility Overal bed mobility: Needs Assistance Bed Mobility: Supine to Sit     Supine to sit: Supervision     General bed mobility comments: slightly labored movement with HOB flat    Transfers Overall transfer level: Needs assistance Equipment used: Rolling walker (2 wheels), None, 1 person hand held assist Transfers: Sit to/from Stand, Bed to chair/wheelchair/BSC Sit to Stand: Min guard   Step pivot transfers: Min guard       General transfer comment: has to lean on armrest of chair without AD, required use of RW for safety    Ambulation/Gait Ambulation/Gait assistance: Supervision Gait Distance (Feet): 100 Feet Assistive device: Rolling walker (2 wheels) Gait Pattern/deviations: Decreased step length - right, Decreased step length - left, Decreased stride length, Trunk flexed Gait velocity: slightly decreased     General Gait Details: good return for using of RW ambulating in room/hallway without loss of balance  Stairs            Wheelchair Mobility    Modified Rankin (  Stroke Patients Only)       Balance Overall balance assessment: Needs assistance Sitting-balance support: Feet unsupported, No upper extremity supported Sitting balance-Leahy Scale: Fair Sitting balance - Comments:  fair/good seated at EOB   Standing balance support: During functional activity, No upper extremity supported Standing balance-Leahy Scale: Poor Standing balance comment: fair/good using RW                             Pertinent Vitals/Pain Pain Assessment Pain Assessment: No/denies pain    Home Living Family/patient expects to be discharged to:: Private residence Living Arrangements: Spouse/significant other Available Help at Discharge: Family;Available 24 hours/day Type of Home: House Home Access: Stairs to enter Entrance Stairs-Rails: Right;Left (to wide to reach both) Entrance Stairs-Number of Steps: 2   Home Layout: One level Home Equipment: Rollator (4 wheels);Shower seat;Grab bars - tub/shower;Cane - single point      Prior Function Prior Level of Function : Independent/Modified Independent             Mobility Comments: household ambulator without AD, uses Rollator for longer distances, shops, drives ADLs Comments: Independent     Hand Dominance        Extremity/Trunk Assessment   Upper Extremity Assessment Upper Extremity Assessment: Overall WFL for tasks assessed    Lower Extremity Assessment Lower Extremity Assessment: Generalized weakness    Cervical / Trunk Assessment Cervical / Trunk Assessment: Kyphotic  Communication   Communication: No difficulties  Cognition Arousal/Alertness: Awake/alert Behavior During Therapy: WFL for tasks assessed/performed Overall Cognitive Status: Within Functional Limits for tasks assessed                                          General Comments      Exercises     Assessment/Plan    PT Assessment Patient needs continued PT services  PT Problem List Decreased strength;Decreased activity tolerance;Decreased balance;Decreased mobility       PT Treatment Interventions DME instruction;Gait training;Stair training;Functional mobility training;Therapeutic activities;Therapeutic  exercise;Balance training;Patient/family education    PT Goals (Current goals can be found in the Care Plan section)  Acute Rehab PT Goals Patient Stated Goal: return home with family to assist PT Goal Formulation: With patient Time For Goal Achievement: 06/12/22 Potential to Achieve Goals: Good    Frequency Min 3X/week     Co-evaluation               AM-PAC PT "6 Clicks" Mobility  Outcome Measure Help needed turning from your back to your side while in a flat bed without using bedrails?: None Help needed moving from lying on your back to sitting on the side of a flat bed without using bedrails?: A Little Help needed moving to and from a bed to a chair (including a wheelchair)?: A Little Help needed standing up from a chair using your arms (e.g., wheelchair or bedside chair)?: A Little Help needed to walk in hospital room?: A Little Help needed climbing 3-5 steps with a railing? : A Little 6 Click Score: 19    End of Session   Activity Tolerance: Patient tolerated treatment well Patient left: in chair;with call bell/phone within reach Nurse Communication: Mobility status PT Visit Diagnosis: Unsteadiness on feet (R26.81);Other abnormalities of gait and mobility (R26.89);Muscle weakness (generalized) (M62.81)    Time: [id number] PT Time Calculation (min) (ACUTE ONLY): 34  min   Charges:   PT Evaluation $PT Eval Moderate Complexity: 1 Mod PT Treatments $Therapeutic Activity: 23-37 mins        2:17 PM, 06/09/22 Lonell Grandchild, MPT Physical Therapist with [facility] [phone] office [phone] mobile phone

## 2022-06-09 NOTE — TOC Initial Note (Signed)
Transition of Care (TOC) - Initial/Assessment Note    Patient Details  Name: Kyle [name]. MRN: [id number] Date of Birth: [birth year]  Transition of Care [facility]) CM/SW Contact:    Iona Beard, [place] Phone Number: 06/09/2022, 11:51 AM  Clinical Narrative:                 TOC updated that PT is recommending [facility] for pt at D/C. CSW spoke with pt who states that he does not feel this will be needed. Pt states he and his wife live together and his daughter lives right down the road. CSW explained that if he changes his mind TOC can get services set up prior to D/C. TOC to follow.   Expected Discharge Plan: Home/Self Care Barriers to Discharge: Continued Medical Work up   Patient Goals and CMS Choice Patient states their goals for this hospitalization and ongoing recovery are:: return home CMS Medicare.gov Compare Post Acute Care list provided to:: Patient Choice offered to / list presented to : Patient      Expected Discharge Plan and Services In-house Referral: Clinical Social Work Discharge Planning Services: CM Consult   Living arrangements for the past 2 months: Single Family Home                           HH Arranged: Refused HH          Prior Living Arrangements/Services Living arrangements for the past 2 months: Single Family Home Lives with:: Spouse                   Activities of Daily Living      Permission Sought/Granted                  Emotional Assessment              Admission diagnosis:  AKI (acute kidney injury) ([place]) [N17.9] Patient Active Problem List   Diagnosis Date Noted   AKI (acute kidney injury) ([place]) 06/09/2022   Elevated troponin 06/09/2022   Sinus bradycardia 06/09/2022   Hyperglycemia 06/09/2022   Sinus node dysfunction ([place]) 06/09/2022   Atrial fibrillation ([place]) 04/20/2022   Atrial fibrillation, persistent ([place]) 04/20/2022   Idiopathic scoliosis of lumbar spine 12/29/2021   Spinal stenosis of lumbar  region 12/29/2021   Gait abnormality 12/29/2021   Lumbar radiculopathy 12/29/2021   Low back pain 09/12/2021   Acquired thrombophilia ([place]) 06/12/2021   Atrial flutter ([place]) 10/11/2020   New onset a-fib/Flutter  10/10/2020   HTN (hypertension) 10/10/2020   Aortic stenosis, moderate/Aortic valve mean gradient is 10.0 mmHg 10/10/2020   Lumbar spondylosis 08/25/2019   Functional GI complaint 05/07/2019   Peripheral neuropathy 11/23/2018   Hypercholesterolemia 06/21/2018   Malignant neoplastic disease ([place]) 06/21/2018   Urge incontinence of urine 01/07/2018   H/O diverticulitis of colon 09/18/2017   Abscess of sigmoid colon due to diverticulitis    Acute diverticulitis of intestine 12/27/2016   Bowel perforation ([place])    Blood in stool    Second degree hemorrhoids    Diverticulosis of large intestine without diverticulitis    Essential hypertension, benign 02/03/2016   Cervical spondylosis without myelopathy 01/22/2016   Nocturnal leg cramps 07/24/2015   Malignant tumor of prostate ([place]) 06/28/2015   Nontoxic multinodular goiter 01/31/2015   Elevated transaminase level 10/26/2014   Ocular Myasthenia ([place]) 02/14/2013   Convulsions/seizures ([place]) 02/14/2013   PCP:  Redmond School, MD Pharmacy:  [facility] ([place], [place] [facility] [address] [place] [address] Phone: [phone] Fax: [phone]  [place] [address] [place] [address] [place] [address] [place] [address] Phone: [phone] Fax: [phone]     Social Determinants of Health (SDOH) Social History: SDOH Screenings   Food Insecurity: No Food Insecurity (04/20/2022)  Housing: Low Risk  (04/20/2022)  Transportation Needs: No Transportation Needs (04/24/2022)  Utilities: Not At Risk (04/20/2022)  Financial Resource Strain: Low Risk  (04/24/2022)  Tobacco Use: Low Risk  (06/09/2022)   SDOH Interventions:      Readmission Risk Interventions    04/22/2022   10:59 AM  Readmission Risk Prevention Plan  Transportation Screening Complete  PCP or Specialist Appt within 5-7 Days Complete  Home Care Screening Complete  Medication Review (RN CM) Complete

## 2022-06-10 ENCOUNTER — Inpatient Hospital Stay (INDEPENDENT_AMBULATORY_CARE_PROVIDER_SITE_OTHER): Payer: Medicare Other

## 2022-06-10 ENCOUNTER — Telehealth: Payer: Self-pay

## 2022-06-10 DIAGNOSIS — R001 Bradycardia, unspecified: Secondary | ICD-10-CM

## 2022-06-10 DIAGNOSIS — R531 Weakness: Secondary | ICD-10-CM

## 2022-06-10 LAB — MAGNESIUM: Magnesium: 2.1 mg/dL (ref 1.7–2.4)

## 2022-06-10 LAB — COMPREHENSIVE METABOLIC PANEL
ALT: 57 U/L — ABNORMAL HIGH (ref 0–44)
AST: 35 U/L (ref 15–41)
Albumin: 3.5 g/dL (ref 3.5–5.0)
Alkaline Phosphatase: 58 U/L (ref 38–126)
Anion gap: 6 (ref 5–15)
BUN: 36 mg/dL — ABNORMAL HIGH (ref 8–23)
CO2: 23 mmol/L (ref 22–32)
Calcium: 8.9 mg/dL (ref 8.9–10.3)
Chloride: 110 mmol/L (ref 98–111)
Creatinine, Ser: 1.21 mg/dL (ref 0.61–1.24)
GFR, Estimated: 59 mL/min — ABNORMAL LOW (ref >60)
Glucose, Bld: 102 mg/dL — ABNORMAL HIGH (ref 70–99)
Potassium: 4.3 mmol/L (ref 3.5–5.1)
Sodium: 139 mmol/L (ref 135–145)
Total Bilirubin: 0.6 mg/dL (ref 0.3–1.2)
Total Protein: 6.9 g/dL (ref 6.5–8.1)

## 2022-06-10 LAB — CBC
HCT: 33.8 % — ABNORMAL LOW (ref 39.0–52.0)
Hemoglobin: 11 g/dL — ABNORMAL LOW (ref 13.0–17.0)
MCH: 31.2 pg (ref 26.0–34.0)
MCHC: 32.5 g/dL (ref 30.0–36.0)
MCV: 95.8 fL (ref 80.0–100.0)
Platelets: 183 10*3/uL (ref 150–400)
RBC: 3.53 MIL/uL — ABNORMAL LOW (ref 4.22–5.81)
RDW: 14.2 % (ref 11.5–15.5)
WBC: 8.5 10*3/uL (ref 4.0–10.5)
nRBC: 0 % (ref 0.0–0.2)

## 2022-06-10 LAB — GLUCOSE, CAPILLARY
Glucose-Capillary: 105 mg/dL — ABNORMAL HIGH (ref 70–99)
Glucose-Capillary: 190 mg/dL — ABNORMAL HIGH (ref 70–99)

## 2022-06-10 LAB — HEMOGLOBIN A1C
Hgb A1c MFr Bld: 5.9 % — ABNORMAL HIGH (ref 4.8–5.6)
Mean Plasma Glucose: 123 mg/dL

## 2022-06-10 MED ORDER — LOSARTAN POTASSIUM 50 MG PO TABS: 50.0000 mg | ORAL_TABLET | Freq: Every day | ORAL | Status: DC

## 2022-06-10 MED ORDER — AMIODARONE HCL 100 MG PO TABS: 100.0000 mg | ORAL_TABLET | Freq: Every day | ORAL | 2 refills | Status: DC

## 2022-06-10 MED ORDER — AMIODARONE HCL 200 MG PO TABS
100.0000 mg | ORAL_TABLET | Freq: Every day | ORAL | Status: DC
  Administered 2022-06-10: 100 mg via ORAL
  Filled 2022-06-10: qty 1

## 2022-06-10 NOTE — Telephone Encounter (Signed)
Monitor ordered and shipped to pts home. Pt can call office once monitor is received to assist with placement.

## 2022-06-10 NOTE — Discharge Summary (Signed)
Physician Discharge Summary   Patient: Kyle [name]. MRN: [id number] DOB: October 18, 1934  Admit date:     06/09/2022  Discharge date: 06/10/22  Discharge Physician: Barton Dubois   PCP: Redmond School, MD   Recommendations at discharge:  Mild to follow electrolytes and renal function Reassess blood pressure and further adjust antihypertensive treatment as needed Make sure patient has follow-up with cardiology service as instructed.  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[place])   Convulsions/seizures ([place])   Elevated transaminase level   Essential hypertension, benign   Atrial flutter ([facility])   Atrial fibrillation ([facility])   Elevated troponin   Sinus bradycardia   Hyperglycemia   Sinus node dysfunction ([facility])   Weakness  Hospital Course: 88 year old male with a history of perforated diverticulitis, hypertension, hyperlipidemia, ocular myasthenia, provoked DVT not on anticoagulation, seizure disorder, atrial flutter status post multiple DCCV's, sinus node dysfunction (sinus brady), moderate aortic stenosis, nontoxic multinodular goiter presenting with generalized weakness and orthostasis type symptoms that began on 06/07/2022.  The patient had an episode of loose stool on 06/07/2022 and once again on 06/08/2022.  He denies any hematochezia or melena.  He took some antimotility medicine with some improvement.  He got up to use the bathroom in the evening of 06/08/2022 and felt lightheadedness and generalized weakness.  He tried to get up again, but was unable to stand secondary to generalized weakness and sat on the floor.  Subsequently, EMS was activated.  The patient denies any fevers, chills, headache, focal extremity weakness, chest pain, shortness breath, coughing, hemoptysis, nausea, vomiting, abdominal pain.  He denies any dysuria or hematuria.  Review of the medical record shows that the patient recently underwent  LAA clipping performed by Dr. Tenny Craw on 04/20/2022.  Since then, the patient has resumed taking his diclofenac tablets twice daily.  In addition, the patient was recently started on chlorthalidone for his elevated blood pressure on 05/12/2022.  His amlodipine was discontinued on that day secondary to lower extremity edema.  He continues on losartan. He went to see Dr. Cristopher Peru on 05/14/2022 secondary to his ongoing bradycardia despite decreasing his metoprolol.  He was felt to have sinus node dysfunction.  Plan was for close observation.  In the ED, the patient was afebrile with soft blood pressures, SBP low 100s, upper 90s.  He was bradycardic into the 30s while asleep and in the 40s when awake.  Oxygen saturation was 100% room air.  WBC 11.2, hemoglobin 10.2, platelets 175,000.  Sodium 135, potassium 4.3, bicarbonate 20, serum creatinine 2.23.  The patient was started on IV fluids.  Cardiology was consulted to assist with management for possible symptomatic bradycardia.  Assessment and Plan: 1-generalized weakness -Multifactorial, but mainly in the setting of dehydration, volume depletion and acute kidney injury. -There was also concerns for symptomatic bradycardia -B12 and TSH within normal limits -CK 54 not demonstrating rhabdomyolysis -Urinalysis without signs of infection -Patient received fluid resuscitation his nephrotoxic agents were held during hospitalization. -Renal function returned to his baseline and vital signs are stabilized -Patient discharged home in stable condition.  2-acute kidney injury -Creatinine on presentation 2.23 -Baseline 0.8-1.1 -At discharge chlorthalidone and diclofenac has been discontinued -Specific restarting thiamine has been dedicated for adjusted dose of losartan -Outpatient follow-up with PCP in 10 days -Patient advised to maintain adequate hydration -Repeat basic metabolic panel to follow renal function trend a follow-up visit.  3-sinus  bradycardia -Appreciate cardiology service recommendation -  At this moment Zio- patch for event monitoring will be instructed. -Safe to resume adjusted dose of amiodarone and close outpatient follow-up. -Patient's rates in the 50s to 70s at discharge. -Per cardiology assessment bradycardia most likely trigger by vasovagal and electrolyte disturbances with acute dehydration process.  4-elevated troponin -Secondary to demand ischemia -No chest pain -EKG demonstrating sinus bradycardia without acute ischemic changes. -Outpatient follow-up with cardiology recommended. -Continue risk factor modification.  5-history of atrial flutter -Status post LAA clipping in January 24 -No longer on anticoagulation -Continue adjusted dose of amiodarone -Continue outpatient follow-up with cardiology service.  6-ocular myasthenia -Continue the use of Mestinon -Continue outpatient follow-up with neurology service.  7-history of show disorder -No seizure activity appreciated while inpatient -Continue the use of Keppra and gabapentin.  8-mixed hyperlipidemia -Continue statin  9-severe osteoarthritis/chronic pain/opioid dependency -Resume home analgesic regimen.  10-essential hypertension -Blood pressure has stabilized and rising -Antihypertensive agent has been adjusted -Heart healthy diet has been instructed/recommended.  11-right fibula fracture -Minimal displaced oblique fracture of the distal fibula diaphysis at the level of syndesmosis. -The case was discussed by Dr. Carles Collet and orthopedic (dr. Corene Cornea Rogers)>> nonoperative management recommended -Outpatient follow-up in 2 weeks after discharge. -Cam boot placed on patient weightbearing as tolerated continue analgesics.  Consultants: Cardiology service Procedures performed: See below for x-ray to ports Disposition: Home Diet recommendation: Heart healthy/low-sodium diet.  DISCHARGE MEDICATION: Allergies as of 06/10/2022       Reactions    Iodinated Contrast Media Hives    pt was premedicated/hives reaction occurred 24 hours post injection, Onset Date: [id number]   Sulfa Antibiotics Hives, Swelling, Rash   Bactericin [bacitracin] Other (See Comments)   Unknown reaction    Norvasc [amlodipine] Swelling   Leg swelling        Medication List     STOP taking these medications    chlorthalidone 25 MG tablet Commonly known as: HYGROTON   diclofenac 75 MG EC tablet Commonly known as: VOLTAREN   oxymetazoline 0.05 % nasal spray Commonly known as: AFRIN       TAKE these medications    amiodarone 100 MG tablet Commonly known as: PACERONE Take 1 tablet (100 mg total) by mouth daily. Start taking on: June 11, 2022 What changed:  medication strength how much to take   cholecalciferol 25 MCG (1000 UNIT) tablet Commonly known as: VITAMIN D3 Take 1,000 Units by mouth daily.   clobetasol cream 0.05 % Commonly known as: TEMOVATE Apply 1 application  topically 2 (two) times daily as needed (blisters).   clobetasol 0.05 % external solution Commonly known as: TEMOVATE Apply 1 Application topically 2 (two) times daily as needed (blisters).   fish oil-omega-3 fatty acids 1000 MG capsule Take 1 g by mouth 4 (four) times daily.   gabapentin 600 MG tablet Commonly known as: NEURONTIN Take 1 tablet (600 mg total) by mouth daily. What changed: when to take this   GARLIQUE PO Take 1 tablet by mouth daily.   HYDROcodone-acetaminophen 5-325 MG tablet Commonly known as: NORCO/VICODIN Take 1 tablet by mouth 2 (two) times daily.   levETIRAcetam 500 MG tablet Commonly known as: KEPPRA Take 1/2 tablet in the morning, take 1 tablet in the evening   losartan 50 MG tablet Commonly known as: COZAAR Take 1 tablet (50 mg total) by mouth daily. Start taking on: June 15, 2022 What changed:  when to take this These instructions start on June 15, 2022. If you are unsure what to do until then, ask  your doctor or other  care provider.   niacinamide 500 MG tablet Take 500 mg by mouth 3 (three) times daily.   Polyethyl Glycol-Propyl Glycol 0.4-0.3 % Soln Place 1 drop into both eyes 3 (three) times daily.   pyridostigmine 60 MG tablet Commonly known as: MESTINON Take 90 mg by mouth 3 (three) times daily.   simvastatin 20 MG tablet Commonly known as: ZOCOR Take 10 mg by mouth at bedtime.   tiZANidine 4 MG tablet Commonly known as: ZANAFLEX Take 4 mg by mouth at bedtime.        Follow-up Information     Redmond School, MD. Schedule an appointment as soon as possible for a visit in 10 day(s).   Specialty: Internal Medicine Contact information: [address] [place] [id number] [phone]                Discharge Exam: Danley Danker Weights   06/09/22 0029 06/10/22 0415  Weight: 73 kg 67.3 kg   General exam: Alert, awake, oriented x 3; in no acute distress and feeling ready to go home. Respiratory system: Improved air movement bilaterally Cardiovascular system:Rate controlled at time of exam. No rubs or gallops; no JVD Gastrointestinal system: Abdomen is nondistended, soft and nontender. No organomegaly or masses felt. Normal bowel sounds heard. Central nervous system: Alert and oriented. No focal neurological deficits. Extremities: No cyanosis or clubbing; no edema. Skin: No petechiae. Psychiatry: Judgement and insight appear normal. Mood & affect appropriate.    Condition at discharge: Stable and improved.  The results of significant diagnostics from this hospitalization (including imaging, microbiology, ancillary and laboratory) are listed below for reference.   Imaging Studies: US Abdomen Complete  Result Date: 06/09/2022 CLINICAL DATA:  Acute kidney injury. History prostate cancer and cholecystectomy. Hypertension. Elevated transaminase level. EXAM: ABDOMEN ULTRASOUND COMPLETE COMPARISON:  Multiple exams, CT scan dated 01/07/2017 FINDINGS: Gallbladder: Surgically  absent Common bile duct: Diameter: 0.8 cm Liver: Chronically stable peripheral calcified nodule posteriorly along the right hepatic lobe, image 7 series 1. No other focal hepatic lesions identified. Nonspecific mild heterogeneity hepatic echogenicity. Portal vein is patent on color Doppler imaging with normal direction of blood flow towards the liver. IVC: No abnormality visualized. Pancreas: Visualized portion unremarkable. The pancreatic head and tail are poorly seen due to overlying bowel gas. Spleen: Size and appearance within normal limits. Right Kidney: Length: 11.1 cm. Cortical thinning noted. Echogenicity within normal limits. No mass or hydronephrosis visualized. Left Kidney: Length: 11.9 cm. Cortical thinning noted. Echogenicity within normal limits. No mass or hydronephrosis visualized. Abdominal aorta: No aneurysm visualized. Atherosclerotic vascular calcifications noted. Bowel gas obscures portions of the abdominal aorta. Other findings: None. IMPRESSION: 1. Generalized mild heterogeneous echogenicity in the liver. This is mild and nonspecific. Consider serologic hepatitis panel to exclude chronic hepatitis as a potential cause. The fairly recent chest CT from 04/16/2022 did not demonstrate substantial hypodensity of the visualized liver to suggest diffuse steatosis. 2. Bilateral renal cortical thinning favoring chronic renal atrophy. No hydronephrosis. Electronically Signed   By: Van Clines M.D.   On: 06/09/2022 09:41   US Venous Img Lower Unilateral Right (DVT)  Result Date: 06/09/2022 CLINICAL DATA:  Right leg swelling EXAM: RIGHT LOWER EXTREMITY VENOUS DOPPLER ULTRASOUND TECHNIQUE: Gray-scale sonography with compression, as well as color and duplex ultrasound, were performed to evaluate the deep venous system(s) from the level of the common femoral vein through the popliteal and proximal calf veins. COMPARISON:  None Available. FINDINGS: VENOUS Normal compressibility of the common  femoral, superficial femoral, and popliteal veins, as well as the visualized calf veins. Visualized portions of profunda femoral vein and great saphenous vein unremarkable. No filling defects to suggest DVT on grayscale or color Doppler imaging. Doppler waveforms show normal direction of venous flow, normal respiratory plasticity and response to augmentation. Limited views of the contralateral common femoral vein are unremarkable. IMPRESSION: No evidence of DVT in the right lower extremity. Electronically Signed   By: Margaretha Sheffield M.D.   On: 06/09/2022 09:21   DG Ankle 2 Views Right  Result Date: 06/09/2022 CLINICAL DATA:  Twisting injury of the ankle after fall EXAM: RIGHT ANKLE - 2 VIEW COMPARISON:  None Available. FINDINGS: Oblique fracture of the distal fibular diaphysis at the level of the syndesmosis with one cortical shaft width lateral displacement. no joint effusion. There is no evidence of arthropathy or other focal bone abnormality. Soft tissue edema of the lateral malleolus. IMPRESSION: Minimally displaced oblique fracture of the distal fibular diaphysis at the level of the syndesmosis. Electronically Signed   By: Darrin Nipper M.D.   On: 06/09/2022 08:05   DG Chest Port 1 View  Result Date: 06/09/2022 CLINICAL DATA:  Bradycardia. Fall. History of a fib and hypertension. EXAM: PORTABLE CHEST 1 VIEW COMPARISON:  04/30/2022. FINDINGS: The heart size and mediastinal contours are within normal limits. A left atrial appendage clip is noted. There is atherosclerotic calcification of the aorta. No consolidation, effusion, or pneumothorax. No acute osseous abnormality. IMPRESSION: No active disease. Electronically Signed   By: Brett Fairy M.D.   On: 06/09/2022 01:39    Microbiology: Results for orders placed or performed during the hospital encounter of 04/16/22  Surgical pcr screen     Status: Abnormal   Collection Time: 04/16/22  9:06 AM   Specimen: Nasal Mucosa; Nasal Swab  Result Value Ref  Range Status   MRSA, PCR NEGATIVE NEGATIVE Final   Staphylococcus aureus POSITIVE (A) NEGATIVE Final    Comment: (NOTE) The Xpert SA Assay (FDA approved for NASAL specimens in patients 62 years of age and older), is one component of a comprehensive surveillance program. It is not intended to diagnose infection nor to guide or monitor treatment. Performed at [facility], [place] [address] [place], [place] [address]   SARS Coronavirus 2 by RT PCR (hospital order, performed in [facility] hospital lab) *cepheid single result test* Anterior Nasal Swab     Status: None   Collection Time: 04/16/22  9:30 AM   Specimen: Anterior Nasal Swab  Result Value Ref Range Status   SARS Coronavirus 2 by RT PCR NEGATIVE NEGATIVE Final    Comment: (NOTE) SARS-CoV-2 target nucleic acids are NOT DETECTED.  The SARS-CoV-2 RNA is generally detectable in upper and lower respiratory specimens during the acute phase of infection. The lowest concentration of SARS-CoV-2 viral copies this assay can detect is 250 copies / mL. A negative result does not preclude SARS-CoV-2 infection and should not be used as the sole basis for treatment or other patient management decisions.  A negative result may occur with improper specimen collection / handling, submission of specimen other than nasopharyngeal swab, presence of viral mutation(s) within the areas targeted by this assay, and inadequate number of viral copies (<250 copies / mL). A negative result must be combined with clinical observations, patient history, and epidemiological information.  Fact Sheet for Patients:   https://www.patel.info/  Fact Sheet for Healthcare Providers: https://hall.com/  This test is not yet approved or  cleared by  the [employer] and has been authorized for detection and/or diagnosis of SARS-CoV-2 by FDA under an Emergency Use Authorization (EUA).  This EUA will remain in effect  (meaning this test can be used) for the duration of the COVID-19 declaration under Section 564(b)(1) of the Act, 21 U.S.C. section 360bbb-3(b)(1), unless the authorization is terminated or revoked sooner.  Performed at [facility], [place] [address] [place], [place] [address]     Labs: CBC: Recent Labs  Lab 06/09/22 0048 06/09/22 0436 06/10/22 0447  WBC 11.2* 9.1 8.5  HGB 10.2* 10.4* 11.0*  HCT 31.2* 32.4* 33.8*  MCV 95.7 96.7 95.8  PLT 175 169 [id number]   Basic Metabolic Panel: Recent Labs  Lab 06/09/22 0048 06/09/22 0436 06/10/22 0447  NA 135 136 139  K 4.3 4.0 4.3  CL 102 105 110  CO2 20* 20* 23  GLUCOSE 229* 131* 102*  BUN 62* 61* 36*  CREATININE 2.23* 1.98* 1.21  CALCIUM 8.9 8.8* 8.9  MG  --  2.3 2.1   Liver Function Tests: Recent Labs  Lab 06/09/22 0048 06/09/22 0436 06/10/22 0447  AST 63* 104* 35  ALT 57* 87* 57*  ALKPHOS 55 60 58  BILITOT 0.9 0.5 0.6  PROT 6.6 6.7 6.9  ALBUMIN 3.5 3.4* 3.5   CBG: Recent Labs  Lab 06/09/22 1232 06/09/22 1640 06/09/22 2049 06/10/22 0736 06/10/22 1103  GLUCAP 135* 142* 154* 105* 190*    Discharge time spent: greater than 30 minutes.  Signed: Barton Dubois, MD [facility] 06/10/2022

## 2022-06-10 NOTE — Plan of Care (Signed)
  Problem: Education: Goal: Ability to describe self-care measures that may prevent or decrease complications (Diabetes Survival Skills Education) will improve Outcome: Progressing Goal: Individualized Educational Video(s) Outcome: Progressing   Problem: Coping: Goal: Ability to adjust to condition or change in health will improve Outcome: Progressing   Problem: Fluid Volume: Goal: Ability to maintain a balanced intake and output will improve Outcome: Progressing   Problem: Health Behavior/Discharge Planning: Goal: Ability to manage health-related needs will improve Outcome: Progressing

## 2022-06-10 NOTE — Telephone Encounter (Signed)
-----   Message from [place], PA-C sent at 06/10/2022  9:50 AM EST ----- Regarding: hospital follow-up Can we please send a 2 week heart monitor for bradycardia and schedule a 2 week follow-up? Thanks!

## 2022-06-10 NOTE — Care Management Important Message (Signed)
Important Message  Patient Details  Name: Kyle [name]. MRN: [id number] Date of Birth: 1934-10-07   Medicare Important Message Given:  N/A - LOS <3 / Initial given by admissions     Tommy Medal 06/10/2022, 11:32 AM

## 2022-06-10 NOTE — Progress Notes (Addendum)
Rounding Note    Patient Name: Kyle [name]. Date of Encounter: 06/10/2022  [facility] Cardiologist: Carlyle Dolly, MD   Subjective   Heart rates in the 52s. Patient has been up and about the room and denies any further dizziness/presyncope. No further diarrhea. AKI improving.  Inpatient Medications    Scheduled Meds:  heparin  5,000 Units Subcutaneous Q8H   insulin aspart  0-5 Units Subcutaneous QHS   insulin aspart  0-6 Units Subcutaneous TID WC   levETIRAcetam  250 mg Oral Daily   levETIRAcetam  500 mg Oral QHS   pyridostigmine  90 mg Oral TID   simvastatin  10 mg Oral QHS   sodium chloride flush  3 mL Intravenous Q12H   Continuous Infusions:  PRN Meds: acetaminophen **OR** acetaminophen   Vital Signs    Vitals:   06/09/22 1505 06/09/22 1602 06/09/22 1921 06/10/22 0415  BP:  (!) 144/66 125/60   Pulse:  (!) 53 (!) 55   Resp:  18 18   Temp: 97.6 F (36.4 C) 98 F (36.7 C) 97.7 F (36.5 C)   TempSrc: Oral Oral Oral   SpO2:  98% 100%   Weight:    67.3 kg  Height:        Intake/Output Summary (Last 24 hours) at 06/10/2022 0855 Last data filed at 06/10/2022 0500 Gross per 24 hour  Intake 1701.45 ml  Output 2200 ml  Net -498.55 ml      06/10/2022    4:15 AM 06/09/2022   12:29 AM 05/21/2022    9:15 AM  Last 3 Weights  Weight (lbs) 148 lb 6.4 oz 160 lb 15 oz 160 lb  Weight (kg) 67.314 kg 73 kg 72.576 kg      Telemetry    SB HR 50s - Personally Reviewed  ECG    No new - Personally Reviewed  Physical Exam   GEN: No acute distress.   Neck: No JVD Cardiac: bradycardia, + murmur, rubs, or gallops.  Respiratory: Clear to auscultation bilaterally. GI: Soft, nontender, non-distended  MS: No edema; No deformity. Neuro:  Nonfocal  Psych: Normal affect   Labs    High Sensitivity Troponin:   Recent Labs  Lab 06/09/22 0048 06/09/22 0233  TROPONINIHS 114* 112*     Chemistry Recent Labs  Lab 06/03/22 1059 06/09/22 0048  06/09/22 0436 06/10/22 0447  NA 139 135 136 139  K 4.6 4.3 4.0 4.3  CL 105 102 105 110  CO2 24 20* 20* 23  GLUCOSE 93 229* 131* 102*  BUN 44* 62* 61* 36*  CREATININE 1.25* 2.23* 1.98* 1.21  CALCIUM 9.8 8.9 8.8* 8.9  MG 1.9  --  2.3 2.1  PROT  --  6.6 6.7 6.9  ALBUMIN  --  3.5 3.4* 3.5  AST  --  63* 104* 35  ALT  --  57* 87* 57*  ALKPHOS  --  55 60 58  BILITOT  --  0.9 0.5 0.6  GFRNONAA  --  28* 32* 59*  ANIONGAP  --  '13 11 6    '$ Lipids No results for input(s): "CHOL", "TRIG", "HDL", "LABVLDL", "LDLCALC", "CHOLHDL" in the last 168 hours.  Hematology Recent Labs  Lab 06/09/22 0048 06/09/22 0436 06/10/22 0447  WBC 11.2* 9.1 8.5  RBC 3.26* 3.35* 3.53*  HGB 10.2* 10.4* 11.0*  HCT 31.2* 32.4* 33.8*  MCV 95.7 96.7 95.8  MCH 31.3 31.0 31.2  MCHC 32.7 32.1 32.5  RDW 13.9 14.0 14.2  PLT 175 169 183   Thyroid  Recent Labs  Lab 06/09/22 0054  TSH 2.399    BNPNo results for input(s): "BNP", "PROBNP" in the last 168 hours.  DDimer No results for input(s): "DDIMER" in the last 168 hours.   Radiology    US Abdomen Complete  Result Date: 06/09/2022 CLINICAL DATA:  Acute kidney injury. History prostate cancer and cholecystectomy. Hypertension. Elevated transaminase level. EXAM: ABDOMEN ULTRASOUND COMPLETE COMPARISON:  Multiple exams, CT scan dated 01/07/2017 FINDINGS: Gallbladder: Surgically absent Common bile duct: Diameter: 0.8 cm Liver: Chronically stable peripheral calcified nodule posteriorly along the right hepatic lobe, image 7 series 1. No other focal hepatic lesions identified. Nonspecific mild heterogeneity hepatic echogenicity. Portal vein is patent on color Doppler imaging with normal direction of blood flow towards the liver. IVC: No abnormality visualized. Pancreas: Visualized portion unremarkable. The pancreatic head and tail are poorly seen due to overlying bowel gas. Spleen: Size and appearance within normal limits. Right Kidney: Length: 11.1 cm. Cortical thinning  noted. Echogenicity within normal limits. No mass or hydronephrosis visualized. Left Kidney: Length: 11.9 cm. Cortical thinning noted. Echogenicity within normal limits. No mass or hydronephrosis visualized. Abdominal aorta: No aneurysm visualized. Atherosclerotic vascular calcifications noted. Bowel gas obscures portions of the abdominal aorta. Other findings: None. IMPRESSION: 1. Generalized mild heterogeneous echogenicity in the liver. This is mild and nonspecific. Consider serologic hepatitis panel to exclude chronic hepatitis as a potential cause. The fairly recent chest CT from 04/16/2022 did not demonstrate substantial hypodensity of the visualized liver to suggest diffuse steatosis. 2. Bilateral renal cortical thinning favoring chronic renal atrophy. No hydronephrosis. Electronically Signed   By: Van Clines M.D.   On: 06/09/2022 09:41   US Venous Img Lower Unilateral Right (DVT)  Result Date: 06/09/2022 CLINICAL DATA:  Right leg swelling EXAM: RIGHT LOWER EXTREMITY VENOUS DOPPLER ULTRASOUND TECHNIQUE: Gray-scale sonography with compression, as well as color and duplex ultrasound, were performed to evaluate the deep venous system(s) from the level of the common femoral vein through the popliteal and proximal calf veins. COMPARISON:  None Available. FINDINGS: VENOUS Normal compressibility of the common femoral, superficial femoral, and popliteal veins, as well as the visualized calf veins. Visualized portions of profunda femoral vein and great saphenous vein unremarkable. No filling defects to suggest DVT on grayscale or color Doppler imaging. Doppler waveforms show normal direction of venous flow, normal respiratory plasticity and response to augmentation. Limited views of the contralateral common femoral vein are unremarkable. IMPRESSION: No evidence of DVT in the right lower extremity. Electronically Signed   By: Margaretha Sheffield M.D.   On: 06/09/2022 09:21   DG Ankle 2 Views Right  Result  Date: 06/09/2022 CLINICAL DATA:  Twisting injury of the ankle after fall EXAM: RIGHT ANKLE - 2 VIEW COMPARISON:  None Available. FINDINGS: Oblique fracture of the distal fibular diaphysis at the level of the syndesmosis with one cortical shaft width lateral displacement. no joint effusion. There is no evidence of arthropathy or other focal bone abnormality. Soft tissue edema of the lateral malleolus. IMPRESSION: Minimally displaced oblique fracture of the distal fibular diaphysis at the level of the syndesmosis. Electronically Signed   By: Darrin Nipper M.D.   On: 06/09/2022 08:05   DG Chest Port 1 View  Result Date: 06/09/2022 CLINICAL DATA:  Bradycardia. Fall. History of a fib and hypertension. EXAM: PORTABLE CHEST 1 VIEW COMPARISON:  04/30/2022. FINDINGS: The heart size and mediastinal contours are within normal limits. A left atrial appendage clip is noted.  There is atherosclerotic calcification of the aorta. No consolidation, effusion, or pneumothorax. No acute osseous abnormality. IMPRESSION: No active disease. Electronically Signed   By: Brett Fairy M.D.   On: 06/09/2022 01:39    Cardiac Studies   Echo intraoperative TEE [id number]  Complications: No known complications during this procedure.  POST-OP IMPRESSIONS  _ Comments: LAA no longer visible following LAA clippng procedure.   PRE-OP FINDINGS   Left Ventricle: The left ventricle has normal systolic function, with an  ejection fraction of 60-65%. The cavity size was normal. There is no left  ventricular hypertrophy.    Right Ventricle: The right ventricle has normal systolic function. The  cavity was normal. There is no increase in right ventricular wall  thickness.   Left Atrium: Left atrial size was normal in size. No left atrial/left  atrial appendage thrombus was detected.   Right Atrium: Right atrial size was normal in size.   Interatrial Septum: No atrial level shunt detected by color flow Doppler.   Pericardium: There is no  evidence of pericardial effusion.   Mitral Valve: The mitral valve is normal in structure. Mitral valve  regurgitation is trivial by color flow Doppler. There is No evidence of  mitral stenosis.   Tricuspid Valve: The tricuspid valve was normal in structure. Tricuspid  valve regurgitation is trivial by color flow Doppler.   Aortic Valve: The aortic valve is tricuspid Aortic valve regurgitation is  trivial by color flow Doppler. There is mild stenosis of the aortic valve,  with a calculated valve area of 1.12 cm.  There is mild thickening and moderate calcification present on the aortic  valve right coronary, left coronary and non-coronary cusps with mildly  decreased mobility.   Pulmonic Valve: The pulmonic valve was normal in structure.  Pulmonic valve regurgitation is trivial by color flow Doppler.    Aorta: The aortic root, ascending aorta and aortic arch are normal in size  and structure.     Echo 10/2021 1. Left ventricular ejection fraction, by estimation, is 70 to 75%. The  left ventricle has hyperdynamic function. The left ventricle has no  regional wall motion abnormalities. Left ventricular diastolic parameters  are consistent with Grade II diastolic  dysfunction (pseudonormalization). The average left ventricular global  longitudinal strain is -19.9 %. The global longitudinal strain is normal.   2. Right ventricular systolic function is normal. The right ventricular  size is normal. There is normal pulmonary artery systolic pressure.   3. Left atrial size was moderately dilated.   4. Right atrial size was mildly dilated.   5. The mitral valve is grossly normal. Trivial mitral valve  regurgitation.   6. The aortic valve is functionally bicuspid. There is moderate  calcification of the aortic valve. Aortic valve regurgitation is trivial.  Moderate aortic valve stenosis. Aortic regurgitation PHT measures 1914  msec. Aortic valve mean gradient measures  24.3 mmHg.  Aortic valve Vmax measures 3.18 m/s. Dimentionless index 0.36.   7. The inferior vena cava is normal in size with greater than 50%  respiratory variability, suggesting right atrial pressure of 3 mmHg.   Comparison(s): Prior images reviewed side by side. Aortic stenosis still  in moderate range, but increased mean gradient from 10 to 24 mmHg  consistent with progression.    Myoview Lexiscan 11/2020    The study is normal. The study is low risk. There are no perfusion defects consistent with prior infarct or current ischemia.   1.0 mm of horizontal ST depression  in the inferior leads (II, III and aVF) was noted.   Defect 1: There is a medium defect with moderate reduction in uptake present in the apical to basal inferior location(s). The defect is most intense in the resting images most consistent with subdiaphragmatic attenuation.   Left ventricular function is normal.  Patient Profile     88 y.o. male with a hx of perforated diverticulitis, hypertension, hyperlipidemia, ocular myasthenia, provoked DVT not on anticoagulation, seizure disorder, atrial flutter status post multiple cardioversions, sinus node dysfunction (sinus bradycardia), moderate aortic stenosis COVID toxic multinodular goiter presents with generalized weakness and orthostasis who is being seen 06/09/2022 for the evaluation of sinus bradycardia   Assessment & Plan    Sinus bradycardia Afib s/p LAA clip - presented with generalized weakness, prior fall found to have AKI and sinus bradycardia with HR lower 30s, administered IVF and atropine 0.'5mg'$  x 1.  - Has a long history of afib with multiple cardioversion and was on amiodarone. Amiodarone was held. - s/p recent LAA clip - not on a/c, thought to be poor candidate - HR has been low in the past, into the 40s - Mag 2.3 and K 4.0 - Tele shows SB with HR in the 50s - amio restarted at low does '100mg'$  daily since it is difficult to stay in NSR.   Generalized  weakness Dizzines/Pre-syncope - likely multifactorial given AKI, dehydration, diarrhea, sinus bradycardia, general deconditioning, Myasthenia gravis - low suspicion bradycardia is contributing - pyridostigmine held - HR in the 50s - patient is overall feeling better - plan for heart monitor at discharge   AKI - PTA losartan, diclofenac and chlorthalidone held - s/p IVF - kidney function improving   Elevated troponin - no chest pain reported - HS trop elevated to 114 - stress test in 2022 was low risk with no perfusion defects of ischemia - intraoperative TEE 04/23/22 showed LVEF 60-65%  - suspect mostly supply demand mismatch, no plans for ischemic work-up   HTN - BP wnl - PTA Losartan and chlorthalidone held for AKI  For questions or updates, please contact [place] Please consult [email or web address] for contact info under        Signed, Cadence Ninfa Meeker, PA-C  06/10/2022, 8:55 AM     Attending note Patient seen and discussed with PA Further, I agree with her documentation  1.Dizziness/presyncope - most likely orthostatic symptoms given occurred after standing and evidence of significant dehydration/AKI on admission. With nausea, upset stomach, multiple rounds of diarrhea may have been a vagal component as well.  -some sinus brady 30s to 40s, unclear how much of a role in the episode. On amio at home currently on hold. Loletha Grayer is a reported adverse effect of pyridostigmine he is on for myasthenia gravis.  - HRs currently 50s to 70s, he checks at home regularly and similar range.    -I think less likely bradycardia was primary issue, more likely orthostasis and possible vasovagal symptoms.  - tele overnight sinus brady 50s - I don't see indication for inpatient EP eval at this point.  - plan for 2 week zio patch.      2. AKI - Cr 2.23 and BUN 62 on admission, last week Cr 1.25 and BUN 44 - Cr trending down slightly after slight fluids - home chlorthalidone,  losartan held   - AKI nearly resolved with IVFs     3. Elevated troponin - difficult to interpret in the absence of clinical context - mild flat trop  elevation in  - perhaps some transient hypotension after event - no plans for ischemic testing.      3.Aflutter - - multiple cardioversions within the last several months - did not tolerate av nodal agents due to bradycardia - started on amio 02/2022 after most recent DCCV in ER, since that time maintaining SR s/p thorascopic appendage closure, off DOACs.  - amio on hold, would restart likely tomowwor  at lower dose '100mg'$  daily. Had a very difficult time getting and keeping him in SR in the past  - restart amio '100mg'$  daily.    5. Ankle fracture - minimally displaced right ankle fracture   We will arrange outpatient monitor, cards f/u. Will sign off inpatient care   Carlyle Dolly MD

## 2022-06-12 ENCOUNTER — Telehealth: Payer: Self-pay | Admitting: *Deleted

## 2022-06-12 DIAGNOSIS — R001 Bradycardia, unspecified: Secondary | ICD-10-CM

## 2022-06-12 NOTE — Telephone Encounter (Signed)
Chlorthalidone contributed to his dehydration, can he update [place] on bp late next week. Would stay off for now  Zandra Abts MD

## 2022-06-12 NOTE — Telephone Encounter (Signed)
Patient notified and verbalized understanding. Patient will update office on bp next week.

## 2022-06-12 NOTE — Telephone Encounter (Signed)
Pt called regarding medication chlorthalidone. Pt stated he was put on medication while in the hospital, and wants to know if he should continue taking. Pt asked for a call back. Please address. Thank you

## 2022-06-12 NOTE — Progress Notes (Signed)
  Care Coordination  Outreach Note  06/12/2022 Name: Kyle [name]. MRN: [id number] DOB: [birth year]  An unsuccessful telephone outreach was attempted today.   Care Coordination Outreach Attempts:   Follow Up Plan:  Additional outreach attempts will be made to offer the patient care coordination information and services.   Encounter Outcome:  No Answer  [place]  Direct Dial: [phone]

## 2022-06-12 NOTE — Telephone Encounter (Signed)
Hospitalist d/c'd Chlorthalidone.   Please advise.

## 2022-06-16 DIAGNOSIS — I10 Essential (primary) hypertension: Secondary | ICD-10-CM | POA: Diagnosis not present

## 2022-06-16 DIAGNOSIS — R569 Unspecified convulsions: Secondary | ICD-10-CM | POA: Diagnosis not present

## 2022-06-16 DIAGNOSIS — R001 Bradycardia, unspecified: Secondary | ICD-10-CM | POA: Diagnosis not present

## 2022-06-16 DIAGNOSIS — I129 Hypertensive chronic kidney disease with stage 1 through stage 4 chronic kidney disease, or unspecified chronic kidney disease: Secondary | ICD-10-CM | POA: Diagnosis not present

## 2022-06-16 DIAGNOSIS — E860 Dehydration: Secondary | ICD-10-CM | POA: Diagnosis not present

## 2022-06-16 DIAGNOSIS — N183 Chronic kidney disease, stage 3 unspecified: Secondary | ICD-10-CM | POA: Diagnosis not present

## 2022-06-16 DIAGNOSIS — I4891 Unspecified atrial fibrillation: Secondary | ICD-10-CM | POA: Diagnosis not present

## 2022-06-16 DIAGNOSIS — Z6823 Body mass index (BMI) 23.0-23.9, adult: Secondary | ICD-10-CM | POA: Diagnosis not present

## 2022-06-16 DIAGNOSIS — G7000 Myasthenia gravis without (acute) exacerbation: Secondary | ICD-10-CM | POA: Diagnosis not present

## 2022-06-18 ENCOUNTER — Telehealth: Payer: Self-pay | Admitting: Cardiology

## 2022-06-18 DIAGNOSIS — M25571 Pain in right ankle and joints of right foot: Secondary | ICD-10-CM | POA: Diagnosis not present

## 2022-06-18 DIAGNOSIS — M79671 Pain in right foot: Secondary | ICD-10-CM | POA: Diagnosis not present

## 2022-06-18 NOTE — Telephone Encounter (Signed)
Pt c/o BP issue: STAT if pt c/o blurred vision, one-sided weakness or slurred speech  1. What are your last 5 BP readings?  3/01:  138/72 73 3/02: 146/77 56 3/03: 141/73 62 3/04: 133/67 62 3/05: 127/63 70 3/06: 125/66 75 3/07: 144/68 57  2. Are you having any other symptoms (ex. Dizziness, headache, blurred vision, passed out)?  No   3. What is your BP issue?  Patient is calling to report BP readings.

## 2022-06-23 NOTE — Telephone Encounter (Signed)
I spoke with daughter,Kim, discussed message from [place], will continue meds as directed.

## 2022-06-23 NOTE — Telephone Encounter (Signed)
BP is variable, at times controlled at times mildly elevated. Would continue current meds for the time being  Zandra Abts MD

## 2022-07-02 DIAGNOSIS — S82831D Other fracture of upper and lower end of right fibula, subsequent encounter for closed fracture with routine healing: Secondary | ICD-10-CM | POA: Diagnosis not present

## 2022-07-02 DIAGNOSIS — M25571 Pain in right ankle and joints of right foot: Secondary | ICD-10-CM | POA: Diagnosis not present

## 2022-07-02 DIAGNOSIS — R001 Bradycardia, unspecified: Secondary | ICD-10-CM | POA: Diagnosis not present

## 2022-07-10 DIAGNOSIS — S82831D Other fracture of upper and lower end of right fibula, subsequent encounter for closed fracture with routine healing: Secondary | ICD-10-CM | POA: Diagnosis not present

## 2022-07-13 DIAGNOSIS — S82831D Other fracture of upper and lower end of right fibula, subsequent encounter for closed fracture with routine healing: Secondary | ICD-10-CM | POA: Diagnosis not present

## 2022-07-17 DIAGNOSIS — S82831D Other fracture of upper and lower end of right fibula, subsequent encounter for closed fracture with routine healing: Secondary | ICD-10-CM | POA: Diagnosis not present

## 2022-07-20 DIAGNOSIS — M47896 Other spondylosis, lumbar region: Secondary | ICD-10-CM | POA: Diagnosis not present

## 2022-07-20 DIAGNOSIS — D6869 Other thrombophilia: Secondary | ICD-10-CM | POA: Diagnosis not present

## 2022-07-22 DIAGNOSIS — S82831D Other fracture of upper and lower end of right fibula, subsequent encounter for closed fracture with routine healing: Secondary | ICD-10-CM | POA: Diagnosis not present

## 2022-07-30 DIAGNOSIS — S82831D Other fracture of upper and lower end of right fibula, subsequent encounter for closed fracture with routine healing: Secondary | ICD-10-CM | POA: Diagnosis not present

## 2022-08-07 ENCOUNTER — Other Ambulatory Visit: Payer: Self-pay | Admitting: "Endocrinology

## 2022-08-07 DIAGNOSIS — E042 Nontoxic multinodular goiter: Secondary | ICD-10-CM | POA: Diagnosis not present

## 2022-08-08 LAB — TSH: TSH: 0.61 mIU/L (ref 0.40–4.50)

## 2022-08-08 LAB — T3, FREE: T3, Free: 2.6 pg/mL (ref 2.3–4.2)

## 2022-08-08 LAB — T4, FREE: Free T4: 1.6 ng/dL (ref 0.8–1.8)

## 2022-08-10 ENCOUNTER — Telehealth: Payer: Self-pay | Admitting: Neurology

## 2022-08-10 DIAGNOSIS — M5416 Radiculopathy, lumbar region: Secondary | ICD-10-CM

## 2022-08-10 DIAGNOSIS — G7000 Myasthenia gravis without (acute) exacerbation: Secondary | ICD-10-CM

## 2022-08-10 DIAGNOSIS — R269 Unspecified abnormalities of gait and mobility: Secondary | ICD-10-CM

## 2022-08-10 NOTE — Telephone Encounter (Signed)
Patient complains of low back pain, not a surgial candidate, now complains of worsening lower extremity weakness,   On lower dose mestinon, no worsening double vision.may continue tapering down mestinon dose

## 2022-08-11 DIAGNOSIS — R5383 Other fatigue: Secondary | ICD-10-CM | POA: Diagnosis not present

## 2022-08-11 DIAGNOSIS — E063 Autoimmune thyroiditis: Secondary | ICD-10-CM | POA: Diagnosis not present

## 2022-08-11 DIAGNOSIS — Z0001 Encounter for general adult medical examination with abnormal findings: Secondary | ICD-10-CM | POA: Diagnosis not present

## 2022-08-11 DIAGNOSIS — G7000 Myasthenia gravis without (acute) exacerbation: Secondary | ICD-10-CM | POA: Diagnosis not present

## 2022-08-11 DIAGNOSIS — E559 Vitamin D deficiency, unspecified: Secondary | ICD-10-CM | POA: Diagnosis not present

## 2022-08-11 DIAGNOSIS — E7849 Other hyperlipidemia: Secondary | ICD-10-CM | POA: Diagnosis not present

## 2022-08-11 DIAGNOSIS — R569 Unspecified convulsions: Secondary | ICD-10-CM | POA: Diagnosis not present

## 2022-08-11 DIAGNOSIS — I129 Hypertensive chronic kidney disease with stage 1 through stage 4 chronic kidney disease, or unspecified chronic kidney disease: Secondary | ICD-10-CM | POA: Diagnosis not present

## 2022-08-11 DIAGNOSIS — N1831 Chronic kidney disease, stage 3a: Secondary | ICD-10-CM | POA: Diagnosis not present

## 2022-08-11 DIAGNOSIS — I10 Essential (primary) hypertension: Secondary | ICD-10-CM | POA: Diagnosis not present

## 2022-08-11 DIAGNOSIS — I4891 Unspecified atrial fibrillation: Secondary | ICD-10-CM | POA: Diagnosis not present

## 2022-08-11 DIAGNOSIS — D518 Other vitamin B12 deficiency anemias: Secondary | ICD-10-CM | POA: Diagnosis not present

## 2022-08-11 DIAGNOSIS — J301 Allergic rhinitis due to pollen: Secondary | ICD-10-CM | POA: Diagnosis not present

## 2022-08-11 DIAGNOSIS — Z6821 Body mass index (BMI) 21.0-21.9, adult: Secondary | ICD-10-CM | POA: Diagnosis not present

## 2022-08-13 ENCOUNTER — Ambulatory Visit: Payer: Medicare Other | Admitting: "Endocrinology

## 2022-08-13 ENCOUNTER — Encounter: Payer: Self-pay | Admitting: "Endocrinology

## 2022-08-13 VITALS — BP 122/58 | HR 52 | Ht 67.5 in | Wt 153.8 lb

## 2022-08-13 DIAGNOSIS — E042 Nontoxic multinodular goiter: Secondary | ICD-10-CM
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
## 2022-09-10 DIAGNOSIS — S82831D Other fracture of upper and lower end of right fibula, subsequent encounter for closed fracture with routine healing: Secondary | ICD-10-CM | POA: Diagnosis not present

## 2022-09-16 ENCOUNTER — Telehealth: Payer: Self-pay | Admitting: *Deleted

## 2022-09-16 NOTE — Telephone Encounter (Signed)
Lesle Chris, LPN 04/18/1094 04:54 AM EDT Back to Top    Notified, copy to pcp.

## 2022-09-16 NOTE — Telephone Encounter (Signed)
-----   Message from Antoine Poche, MD sent at 09/14/2022  9:21 AM EDT ----- No worrisome findings on heart monitor  Dominga Ferry MD

## 2022-10-29 ENCOUNTER — Ambulatory Visit: Payer: Medicare Other | Attending: Cardiology | Admitting: Cardiology

## 2022-10-29 ENCOUNTER — Encounter: Payer: Self-pay | Admitting: Cardiology

## 2022-10-29 VITALS — BP 136/64 | HR 70 | Ht 67.0 in | Wt 151.4 lb

## 2022-10-29 DIAGNOSIS — I350 Nonrheumatic aortic (valve) stenosis: Secondary | ICD-10-CM

## 2022-10-29 DIAGNOSIS — I4892 Unspecified atrial flutter: Secondary | ICD-10-CM

## 2022-10-29 DIAGNOSIS — I10 Essential (primary) hypertension: Secondary | ICD-10-CM | POA: Diagnosis not present

## 2022-10-29 DIAGNOSIS — E782 Mixed hyperlipidemia: Secondary | ICD-10-CM | POA: Diagnosis not present

## 2022-10-29 NOTE — Progress Notes (Signed)
Clinical Summary Kyle [name] is a 88 y.o.male seen today for follow up of the following medical problems.   1.Atrial flutter - 11/07/20 DCCV. HRs low after conversion, diltiazem was stopped and continued on toprol 25mg . Toprol lowered to 12.5mg  daily after ongoing bradycardia and then later stopped   -recurrent aflutter at 11/13/21 appt,  - 11/20/21 succesful DCCV to SR. Low HRs after conversion, toprol was stopped - 12/24/21 nursing visit EKG back in aflutter rate 93 -12/26/21 ER visit DCCV again to SR   -DCCV in ER 03/01/22 - amio started 03/03/22 - has maintained SR since then, no recent symptoms    - evaluated for watchman, not able to proceed due to severe contrast allergy  -Dr Lalla Brothers had suggested if patient interested referring him to Dr. Delia Chimes to consider thoracoscopic, off-pump left atrial appendage occlusion/clipping.  Patient does have interest, debilitating arthritis unable to take NSAIDs on eliquis.   -s/p thorascopic closure left atrial appendage by Dr Delia Chimes 04/20/22. Has done well        06/2022 monitor: 14 day monitor, rare ectopy, 12 runs SVT longest 9 beats, no symptoms reported  - no recent symptoms.  - avg heart rates at home 60s. Home bp cuff does report some irregular beats at times 07/2022 normal TFTs and LFTs          2,. HTN - he is compliant with meds - recently started norvasc 2.5mg  daily.  - home bp's 130s-150s/70s - recent leg swelling since starting norvasc. He says he thinks years ago was tried on a medication for bp that also causes swelling but not sure of the name.   Home bp's 130s/70s - off chlortahlidone, admit 05/2022 with dehydration and AKI   4. Aortic stenosis - 09/2020 echo LVEF 65-70%, AV mean grad 10 DI 0.44 AVA VTI 1.39 SVI 32  10/2021 echo LVEF 70-75%, grade II dd, mod AS mean grad 24 DI 0.36 AVA VTI 1.12 - denies any SOB/DOE, no chest pain     5. Hyperlipidemia - on simvastatin - 07/2022 TC 173 TG 84 HDL 88 LDL 70 Past Medical  History:  Diagnosis Date   Arthritis    Atrial fibrillation (HCC)    Auto immune neutropenia (HCC)    Bullous pemphigoid    Cancer (HCC)    Prostate   Cervical spondylosis without myelopathy 01/22/2016   Chronic kidney disease    Per MD note 05/2021 CKD stage non specified   Complication of anesthesia    difficult to wake up after anesthesia   DVT (deep venous thrombosis) (HCC)    a. Has had a previous right lower extremity DVT in the setting of hospitalization and surgery (after his brain tumor was resected in 1993) but is no longer on Coumadin.   Dyslipidemia    Dysrhythmia    History of kidney stones    History of prostate cancer    Hypertension    Meningioma (HCC)    a. s/p resection 1990s.   Myasthenia (HCC) 02/14/2013   Nocturnal leg cramps    Ocular myasthenia gravis (HCC)    Peripheral neuropathy 11/23/2018   Seizures (HCC)    Sinus bradycardia    Sleep paralysis    Thyroid nodule, cold      Allergies  Allergen Reactions   Iodinated Contrast Media Hives     pt was premedicated/hives reaction occurred 24 hours post injection, Onset Date: [id number]    Sulfa Antibiotics Hives, Swelling and Rash   Bactericin [  Bacitracin] Other (See Comments)    Unknown reaction    Norvasc [Amlodipine] Swelling    Leg swelling     Current Outpatient Medications  Medication Sig Dispense Refill   amiodarone (PACERONE) 100 MG tablet Take 1 tablet (100 mg total) by mouth daily. 60 tablet 2   cholecalciferol (VITAMIN D3) 25 MCG (1000 UNIT) tablet Take 1,000 Units by mouth daily.     clobetasol (TEMOVATE) 0.05 % external solution Apply 1 Application topically 2 (two) times daily as needed (blisters).     clobetasol cream (TEMOVATE) 0.05 % Apply 1 application  topically 2 (two) times daily as needed (blisters).     fish oil-omega-3 fatty acids 1000 MG capsule Take 1 g by mouth 4 (four) times daily.     gabapentin (NEURONTIN) 600 MG tablet Take 1 tablet (600 mg total) by mouth daily.  (Patient taking differently: Take 600 mg by mouth at bedtime.) 90 tablet 3   Garlic (GARLIQUE PO) Take 1 tablet by mouth daily.     HYDROcodone-acetaminophen (NORCO/VICODIN) 5-325 MG tablet Take 1 tablet by mouth 2 (two) times daily.     levETIRAcetam (KEPPRA) 500 MG tablet Take 1/2 tablet in the morning, take 1 tablet in the evening 135 tablet 3   losartan (COZAAR) 50 MG tablet Take 1 tablet (50 mg total) by mouth daily.     niacinamide 500 MG tablet Take 500 mg by mouth 3 (three) times daily.     Polyethyl Glycol-Propyl Glycol 0.4-0.3 % SOLN Place 1 drop into both eyes 3 (three) times daily.     pyridostigmine (MESTINON) 60 MG tablet Take 90 mg by mouth 3 (three) times daily.     simvastatin (ZOCOR) 20 MG tablet Take 10 mg by mouth at bedtime.     tiZANidine (ZANAFLEX) 4 MG tablet Take 4 mg by mouth at bedtime.     No current facility-administered medications for this visit.     Past Surgical History:  Procedure Laterality Date   BLADDER SURGERY     removed part of the bladder   BRAIN SURGERY     CARDIOVERSION N/A 11/07/2020   Procedure: CARDIOVERSION;  Surgeon: Antoine Poche, MD;  Location: [facility];  Service: Endoscopy;  Laterality: N/A;   CARDIOVERSION N/A 11/20/2021   Procedure: CARDIOVERSION;  Surgeon: Antoine Poche, MD;  Location: [facility];  Service: Endoscopy;  Laterality: N/A;   CHOLECYSTECTOMY     CLIPPING OF ATRIAL APPENDAGE N/A 04/20/2022   Procedure: CLIPPING OF ATRIAL APPENDAGE USING 45 ATRIAL CLIP;  Surgeon: Kyle Hollingshead, MD;  Location: [facility] OR;  Service: Open Heart Surgery;  Laterality: N/A;   COLONOSCOPY N/A 07/25/2013   Procedure: COLONOSCOPY;  Surgeon: Dalia Heading, MD;  Location: [facility];  Service: Gastroenterology;  Laterality: N/A;   COLONOSCOPY N/A 11/10/2016   Procedure: COLONOSCOPY;  Surgeon: Franky Macho, MD;  Location: [facility];  Service: Gastroenterology;  Laterality: N/A;   IR CATHETER TUBE CHANGE  01/02/2017   IR RADIOLOGIST EVAL & MGMT   01/06/2017   IR RADIOLOGIST EVAL & MGMT  01/20/2017   IR RADIOLOGIST EVAL & MGMT  02/03/2017   IR RADIOLOGIST EVAL & MGMT  02/17/2017   IR SINUS/FIST TUBE CHK-NON GI  03/03/2017   PARTIAL COLECTOMY     RADIOACTIVE SEED IMPLANT     TEE WITHOUT CARDIOVERSION N/A 04/20/2022   Procedure: TRANSESOPHAGEAL ECHOCARDIOGRAM (TEE);  Surgeon: Kyle Hollingshead, MD;  Location: [facility] OR;  Service: Open Heart Surgery;  Laterality: N/A;  VIDEO ASSISTED THORACOSCOPY Left 04/20/2022   Procedure: VIDEO ASSISTED THORACOSCOPY;  Surgeon: Kyle Hollingshead, MD;  Location: [facility] OR;  Service: Thoracic;  Laterality: Left;     Allergies  Allergen Reactions   Iodinated Contrast Media Hives     pt was premedicated/hives reaction occurred 24 hours post injection, Onset Date: [id number]    Sulfa Antibiotics Hives, Swelling and Rash   Bactericin [Bacitracin] Other (See Comments)    Unknown reaction    Norvasc [Amlodipine] Swelling    Leg swelling      Family History  Problem Relation Age of Onset   Heart failure Mother    Diabetes Mother    Diabetes Sister    Dementia Sister    Lung cancer Son    Heart failure Daughter    Colon cancer Neg Hx      Social History Kyle [name] reports that he has never smoked. He has never been exposed to tobacco smoke. He has never used smokeless tobacco. Kyle [name] reports no history of alcohol use.   Review of Systems CONSTITUTIONAL: No weight loss, fever, chills, weakness or fatigue.  HEENT: Eyes: No visual loss, blurred vision, double vision or yellow sclerae.No hearing loss, sneezing, congestion, runny nose or sore throat.  SKIN: No rash or itching.  CARDIOVASCULAR: per hpi RESPIRATORY: No shortness of breath, cough or sputum.  GASTROINTESTINAL: No anorexia, nausea, vomiting or diarrhea. No abdominal pain or blood.  GENITOURINARY: No burning on urination, no polyuria NEUROLOGICAL: No headache, dizziness, syncope, paralysis, ataxia, numbness or tingling in the extremities.  No change in bowel or bladder control.  MUSCULOSKELETAL: No muscle, back pain, joint pain or stiffness.  LYMPHATICS: No enlarged nodes. No history of splenectomy.  PSYCHIATRIC: No history of depression or anxiety.  ENDOCRINOLOGIC: No reports of sweating, cold or heat intolerance. No polyuria or polydipsia.  Marland Kitchen   Physical Examination Today's Vitals   10/29/22 1128  BP: 136/64  Pulse: 70  SpO2: 98%  Weight: 151 lb 6.4 oz (68.7 kg)  Height: 5\' 7"  (1.702 m)   Body mass index is 23.71 kg/m.  Gen: resting comfortably, no acute distress HEENT: no scleral icterus, pupils equal round and reactive, no palptable cervical adenopathy,  CV: RRR, no m/rg, no jvd Resp: Clear to auscultation bilaterally GI: abdomen is soft, non-tender, non-distended, normal bowel sounds, no hepatosplenomegaly MSK: extremities are warm, no edema.  Skin: warm, no rash Neuro:  no focal deficits Psych: appropriate affect   Diagnostic Studies     Assessment and Plan   1.Aflutter/acquired thrombophilia - multiple cardioversions within the last several months - did not tolerate av nodal agents due to bradycardia - started on amio 02/2022 after most recent DCCV in ER, since that time maintaining SR - s/p thorascopic appendage closure, off DOACs. He has debilitating arthritis that requires NSAID use that had been contraindicated when on DOAC  - no significant palpitations, EKG today shows maintaining SR - continue current meds - recent normal TFTs and LFTs on amio    2. HTN - LE edema on norvasc. Prior AKI and dehyration, we stopped chlorthalidone. - bp overall at goal, continue current meds     3. Aortic stenosis - moderate and asymptomatic - we will repeat echo  4. Hyperlipidemia - at goal, continue current meds     Antoine Poche, M.D.

## 2022-10-29 NOTE — Patient Instructions (Addendum)
Medication Instructions:  Continue all current medications.  Labwork: none  Testing/Procedures: Your physician has requested that you have an echocardiogram. Echocardiography is a painless test that uses sound waves to create images of your heart. It provides your doctor with information about the size and shape of your heart and how well your heart's chambers and valves are working. This procedure takes approximately one hour. There are no restrictions for this procedure. Please do NOT wear cologne, perfume, aftershave, or lotions (deodorant is allowed). Please arrive 15 minutes prior to your appointment time. Office will contact with results via phone, letter or mychart.     Follow-Up: 6 months   Any Other Special Instructions Will Be Listed Below (If Applicable).   If you need a refill on your cardiac medications before your next appointment, please call your pharmacy.  

## 2022-11-02 ENCOUNTER — Encounter: Payer: Self-pay | Admitting: *Deleted

## 2022-11-05 ENCOUNTER — Telehealth: Payer: Self-pay | Admitting: *Deleted

## 2022-11-05 NOTE — Progress Notes (Signed)
  Care Coordination  Outreach Note  11/05/2022 Name: Kyle [name]. MRN: [id number] DOB: 08-Aug-1934   Care Coordination Outreach Attempts: An unsuccessful telephone outreach was attempted today to offer the patient information about available care coordination services.  Follow Up Plan:  Additional outreach attempts will be made to offer the patient care coordination information and services.   Encounter Outcome:  No Answer  Christie Nottingham  Care Coordination Care Guide  Direct Dial: [phone]

## 2022-11-06 NOTE — Progress Notes (Signed)
  Care Coordination Note  11/06/2022 Name: Kyle [name]. MRN: [id number] DOB: September 23, 1934  Kyle [name]. is a 88 y.o. year old male who is a primary care patient of Elfredia Nevins, MD and is actively engaged with the care management team. I reached out to [employer] by phone today to assist with scheduling a follow up visit with the RN Case Manager  Follow up plan: Telephone appointment with care management team member scheduled for:11/09/22  [facility] Coordination Care Guide  Direct Dial: [phone]

## 2022-11-09 ENCOUNTER — Encounter: Payer: Self-pay | Admitting: *Deleted

## 2022-11-09 ENCOUNTER — Ambulatory Visit: Payer: Self-pay | Admitting: *Deleted

## 2022-11-09 NOTE — Patient Outreach (Signed)
  Care Coordination   Follow Up Visit Note   11/09/2022 Name: Kyle [name]. MRN: [id number] DOB: [birth year]  Kyle [name]. is a 88 y.o. year old male who sees Elfredia Nevins, MD for primary care. I spoke with  Kyle [name]. by phone today.  What matters to the patients health and wellness today?  Manage Afib    Goals Addressed             This Visit's Progress    Management of AFib   On track    Care Coordination Goals: Patient will keep all medical appointments Patient will take medications as directed Patient will measure and record blood pressure and heart rate daily a will call PCP or cardiologist with any readings outside of recommended range Patient will take blood pressure/heart rate logs to provider visits for review Patient will reach out to cardiologist with any symptoms of Afib Patient will reach out to RN Care Manager with any care coordination or resource needs [phone]         SDOH assessments and interventions completed:  Yes  SDOH Interventions Today    Flowsheet Row Most Recent Value  SDOH Interventions   Transportation Interventions Intervention Not Indicated  Financial Strain Interventions Intervention Not Indicated  Physical Activity Interventions Intervention Not Indicated        Care Coordination Interventions:  Yes, provided  Interventions Today    Flowsheet Row Most Recent Value  Chronic Disease   Chronic disease during today's visit Atrial Fibrillation (AFib)  General Interventions   General Interventions Discussed/Reviewed General Interventions Discussed, General Interventions Reviewed, Doctor Visits  [BP this morning 147/65 hr 60. Heart rate averages around 60.]  Doctor Visits Discussed/Reviewed Doctor Visits Discussed, Specialist, Doctor Visits Reviewed, PCP  [discussed recent cardiology visit]  PCP/Specialist Visits Compliance with follow-up visit  [Echo tomorrow, Dr Ladona Ridgel 11/12/22]  Exercise Interventions    Exercise Discussed/Reviewed Exercise Discussed, Exercise Reviewed, Physical Activity  [patient is phyiscally active daily. He enjoys gardening and mows his yard and his daughters yard.]  Physical Activity Discussed/Reviewed Physical Activity Discussed, Physical Activity Reviewed  Education Interventions   Education Provided Provided Education  Provided Verbal Education On When to see the doctor, Exercise, Medication, Other  [blood pressure and heart rate monitoring]  Nutrition Interventions   Nutrition Discussed/Reviewed Nutrition Discussed, Nutrition Reviewed  Pharmacy Interventions   Pharmacy Dicussed/Reviewed Pharmacy Topics Discussed, Pharmacy Topics Reviewed, Medications and their functions  [taking amiodarone for afib. Not on a beta blocker or blood thinner. Not a candidate for the Watchman device b/c of allergy to contrast die.]  Safety Interventions   Safety Discussed/Reviewed Safety Discussed, Safety Reviewed, Fall Risk       Follow up plan: Follow up call scheduled for 12/10/22    Encounter Outcome:  Pt. Visit Completed   Demetrios Loll, BSN, RN-BC RN Care Coordinator [facility]  [facility] Direct Dial: [phone] Main #: [phone]

## 2022-11-10 ENCOUNTER — Ambulatory Visit: Payer: Medicare Other

## 2022-11-10 DIAGNOSIS — I350 Nonrheumatic aortic (valve) stenosis: Secondary | ICD-10-CM

## 2022-11-12 ENCOUNTER — Encounter: Payer: Self-pay | Admitting: Internal Medicine

## 2022-11-12 ENCOUNTER — Ambulatory Visit: Payer: Medicare Other | Attending: Internal Medicine | Admitting: Internal Medicine

## 2022-11-12 VITALS — BP 150/76 | HR 58 | Ht 67.0 in | Wt 153.8 lb

## 2022-11-12 DIAGNOSIS — I484 Atypical atrial flutter: Secondary | ICD-10-CM | POA: Diagnosis not present

## 2022-11-12 MED ORDER — AMIODARONE HCL 200 MG PO TABS: ORAL_TABLET | ORAL | 3 refills | Status: DC

## 2022-11-12 NOTE — Patient Instructions (Signed)
Medication Instructions:   Increase Amiodarone to 200 mg on Saturday and Sunday, Take 100 mg Monday -Friday   *If you need a refill on your cardiac medications before your next appointment, please call your pharmacy*   Lab Work: NONE   If you have labs (blood work) drawn today and your tests are completely normal, you will receive your results only by: MyChart Message (if you have MyChart) OR A paper copy in the mail If you have any lab test that is abnormal or we need to change your treatment, we will call you to review the results.   Testing/Procedures: NONE    Follow-Up: At [facility], you and your health needs are our priority.  As part of our continuing mission to provide you with exceptional heart care, we have created designated Provider Care Teams.  These Care Teams include your primary Cardiologist (physician) and Advanced Practice Providers (APPs -  Physician Assistants and Nurse Practitioners) who all work together to provide you with the care you need, when you need it.  We recommend signing up for the patient portal called "MyChart".  Sign up information is provided on this After Visit Summary.  MyChart is used to connect with patients for Virtual Visits (Telemedicine).  Patients are able to view lab/test results, encounter notes, upcoming appointments, etc.  Non-urgent messages can be sent to your provider as well.   To learn more about what you can do with MyChart, go to [email or web address].    Your next appointment:   1 year(s)  Provider:   Lewayne Bunting, MD    Other Instructions Thank you for choosing [facility]!

## 2022-11-12 NOTE — Addendum Note (Signed)
Addended by: Kerney Elbe on: 11/12/2022 01:23 PM   Modules accepted: Orders

## 2022-11-12 NOTE — Progress Notes (Signed)
HPI Kyle [name] returns today for evaluation of atrial flutter. He is a pleasant 88 yo man with recurrent atrial flutter s/p multiple DCCV's. Review of his ECG's demonstrates atypical atrial flutter with a RVR. He was placed on amiodarone after his last DCCV. He was thought to be a poor candidate for systemic anti-coag and underwent surgical LAA closure by Dr. Delia Chimes. The patient has not had more symptomatic atrial fib or flutter. His dose of amio was reduced. Last week he started having more symptoms. He has NSR with PVC's    Allergies  Allergen Reactions   Iodinated Contrast Media Hives     pt was premedicated/hives reaction occurred 24 hours post injection, Onset Date: [id number]    Sulfa Antibiotics Hives, Swelling and Rash   Bactericin [Bacitracin] Other (See Comments)    Unknown reaction    Norvasc [Amlodipine] Swelling    Leg swelling     Current Outpatient Medications  Medication Sig Dispense Refill   amiodarone (PACERONE) 100 MG tablet Take 1 tablet (100 mg total) by mouth daily. 60 tablet 2   cholecalciferol (VITAMIN D3) 25 MCG (1000 UNIT) tablet Take 1,000 Units by mouth daily.     clobetasol (TEMOVATE) 0.05 % external solution Apply 1 Application topically 2 (two) times daily as needed (blisters).     clobetasol cream (TEMOVATE) 0.05 % Apply 1 application  topically 2 (two) times daily as needed (blisters).     fish oil-omega-3 fatty acids 1000 MG capsule Take 1 g by mouth 4 (four) times daily.     Flaxseed, Linseed, (FLAXSEED OIL PO) Take 1 capsule by mouth.     gabapentin (NEURONTIN) 600 MG tablet Take 1 tablet (600 mg total) by mouth daily. (Patient taking differently: Take 600 mg by mouth at bedtime.) 90 tablet 3   Garlic (GARLIQUE PO) Take 1 tablet by mouth daily.     HYDROcodone-acetaminophen (NORCO/VICODIN) 5-325 MG tablet Take 1 tablet by mouth 2 (two) times daily.     levETIRAcetam (KEPPRA) 500 MG tablet Take 1/2 tablet in the morning, take 1 tablet in the  evening 135 tablet 3   losartan (COZAAR) 50 MG tablet Take 1 tablet (50 mg total) by mouth daily.     niacinamide 500 MG tablet Take 500 mg by mouth 3 (three) times daily.     Polyethyl Glycol-Propyl Glycol 0.4-0.3 % SOLN Place 1 drop into both eyes 3 (three) times daily.     pyridostigmine (MESTINON) 60 MG tablet Take 90 mg by mouth 3 (three) times daily.     simvastatin (ZOCOR) 20 MG tablet Take 10 mg by mouth at bedtime.     tiZANidine (ZANAFLEX) 4 MG tablet Take 4 mg by mouth at bedtime.     No current facility-administered medications for this visit.     Past Medical History:  Diagnosis Date   Arthritis    Atrial fibrillation (HCC)    Auto immune neutropenia (HCC)    Bullous pemphigoid    Cancer (HCC)    Prostate   Cervical spondylosis without myelopathy 01/22/2016   Chronic kidney disease    Per MD note 05/2021 CKD stage non specified   Complication of anesthesia    difficult to wake up after anesthesia   DVT (deep venous thrombosis) (HCC)    a. Has had a previous right lower extremity DVT in the setting of hospitalization and surgery (after his brain tumor was resected in 1993) but is no longer on Coumadin.   Dyslipidemia  Dysrhythmia    History of kidney stones    History of prostate cancer    Hypertension    Meningioma (HCC)    a. s/p resection 1990s.   Myasthenia (HCC) 02/14/2013   Nocturnal leg cramps    Ocular myasthenia gravis (HCC)    Peripheral neuropathy 11/23/2018   Seizures (HCC)    Sinus bradycardia    Sleep paralysis    Thyroid nodule, cold     ROS:   All systems reviewed and negative except as noted in the HPI.   Past Surgical History:  Procedure Laterality Date   BLADDER SURGERY     removed part of the bladder   BRAIN SURGERY     CARDIOVERSION N/A 11/07/2020   Procedure: CARDIOVERSION;  Surgeon: Antoine Poche, MD;  Location: [facility];  Service: Endoscopy;  Laterality: N/A;   CARDIOVERSION N/A 11/20/2021   Procedure: CARDIOVERSION;   Surgeon: Antoine Poche, MD;  Location: [facility];  Service: Endoscopy;  Laterality: N/A;   CHOLECYSTECTOMY     CLIPPING OF ATRIAL APPENDAGE N/A 04/20/2022   Procedure: CLIPPING OF ATRIAL APPENDAGE USING 45 ATRIAL CLIP;  Surgeon: Lyn Hollingshead, MD;  Location: [facility];  Service: Open Heart Surgery;  Laterality: N/A;   COLONOSCOPY N/A 07/25/2013   Procedure: COLONOSCOPY;  Surgeon: Dalia Heading, MD;  Location: [facility];  Service: Gastroenterology;  Laterality: N/A;   COLONOSCOPY N/A 11/10/2016   Procedure: COLONOSCOPY;  Surgeon: Franky Macho, MD;  Location: [facility];  Service: Gastroenterology;  Laterality: N/A;   IR CATHETER TUBE CHANGE  01/02/2017   IR RADIOLOGIST EVAL & MGMT  01/06/2017   IR RADIOLOGIST EVAL & MGMT  01/20/2017   IR RADIOLOGIST EVAL & MGMT  02/03/2017   IR RADIOLOGIST EVAL & MGMT  02/17/2017   IR SINUS/FIST TUBE CHK-NON GI  03/03/2017   PARTIAL COLECTOMY     RADIOACTIVE SEED IMPLANT     TEE WITHOUT CARDIOVERSION N/A 04/20/2022   Procedure: TRANSESOPHAGEAL ECHOCARDIOGRAM (TEE);  Surgeon: Lyn Hollingshead, MD;  Location: [facility] OR;  Service: Open Heart Surgery;  Laterality: N/A;   VIDEO ASSISTED THORACOSCOPY Left 04/20/2022   Procedure: VIDEO ASSISTED THORACOSCOPY;  Surgeon: Lyn Hollingshead, MD;  Location: [facility] OR;  Service: Thoracic;  Laterality: Left;     Family History  Problem Relation Age of Onset   Heart failure Mother    Diabetes Mother    Diabetes Sister    Dementia Sister    Lung cancer Son    Heart failure Daughter    Colon cancer Neg Hx      Social History   Socioeconomic History   Marital status: Married    Spouse name: Diplomatic Services operational officer   Number of children: 1   Years of education: 12 TH   Highest education level: Not on file  Occupational History   Occupation: retired  Tobacco Use   Smoking status: Never    Passive exposure: Never   Smokeless tobacco: Never  Vaping Use   Vaping status: Never Used  Substance and Sexual Activity   Alcohol use: No    Drug use: No   Sexual activity: Not on file  Other Topics Concern   Not on file  Social History Narrative   Patient is right handed.   Patient drinks 1 cup of coffee daily.   Social Determinants of Health   Financial Resource Strain: Low Risk  (11/09/2022)   Overall Financial Resource Strain (CARDIA)    Difficulty of Paying Living Expenses:  Not hard at all  Food Insecurity: No Food Insecurity (06/09/2022)   Hunger Vital Sign    Worried About Running Out of Food in the Last Year: Never true    Ran Out of Food in the Last Year: Never true  Transportation Needs: No Transportation Needs (11/09/2022)   PRAPARE - Administrator, Civil Service (Medical): No    Lack of Transportation (Non-Medical): No  Physical Activity: Sufficiently Active (11/09/2022)   Exercise Vital Sign    Days of Exercise per Week: 7 days    Minutes of Exercise per Session: 30 min  Stress: Not on file  Social Connections: Not on file  Intimate Partner Violence: Not At Risk (06/09/2022)   Humiliation, Afraid, Rape, and Kick questionnaire    Fear of Current or Ex-Partner: No    Emotionally Abused: No    Physically Abused: No    Sexually Abused: No     BP (!) 150/76 (BP Location: Left Arm, Patient Position: Sitting, Cuff Size: Normal)   Pulse (!) 58   Ht 5\' 7"  (1.702 m)   Wt 153 lb 12.8 oz (69.8 kg)   SpO2 97%   BMI 24.09 kg/m   Physical Exam:  Well appearing NAD HEENT: Unremarkable Neck:  No JVD, no thyromegally Lymphatics:  No adenopathy Back:  No CVA tenderness Lungs:  Clear with no wheezes HEART:  Regular rate rhythm, 2/6 systolic murmurs, no rubs, no clicks Abd:  soft, positive bowel sounds, no organomegally, no rebound, no guarding Ext:  2 plus pulses, no edema, no cyanosis, no clubbing Skin:  No rashes no nodules Neuro:  CN II through XII intact, motor grossly intact  EKG - reviewed  Assess/Plan:  Recurrent LA flutter - I have discussed the treatment options with the patient. I  have recommended he continue his low dose of amiodarone. If he has more flutter on amio or develops symptomatic brady, we will cosider more options.  Sinus node dysfunction - no evidence for the need for PPM at this time but we will undergo watchful waiting.  HTN - his b p is elevated but is usually much better at home.  AS - his echo shows a mean gradient of 32. He will follow with Dr. Wyline Mood.    Kyle Gowda Elex Mainwaring,MD

## 2022-12-01 DIAGNOSIS — M5136 Other intervertebral disc degeneration, lumbar region: Secondary | ICD-10-CM | POA: Diagnosis not present

## 2022-12-02 ENCOUNTER — Telehealth: Payer: Self-pay | Admitting: Cardiology

## 2022-12-02 NOTE — Telephone Encounter (Signed)
Left a message for patient to call office back regarding results.  

## 2022-12-02 NOTE — Telephone Encounter (Signed)
Pt returning nurses phone call. Please advise ?

## 2022-12-02 NOTE — Telephone Encounter (Signed)
Left message for patient to return call.

## 2022-12-10 ENCOUNTER — Encounter: Payer: Self-pay | Admitting: *Deleted

## 2022-12-15 ENCOUNTER — Ambulatory Visit: Payer: Self-pay | Admitting: *Deleted

## 2022-12-15 ENCOUNTER — Encounter: Payer: Self-pay | Admitting: *Deleted

## 2022-12-15 NOTE — Patient Outreach (Signed)
  Care Coordination   Follow Up Visit Note   12/15/2022 Name: Kyle [name]. MRN: [id number] DOB: [birth year]  Kyle Hook. is a 88 y.o. year old male who sees Kyle Nevins, MD for primary care. I spoke with  Kyle Hook. by phone today.  What matters to the patients health and wellness today?  Managing Afib and following up with cardiologist regarding echocardiogram results.    Goals Addressed             This Visit's Progress    Management of AFib   On track    Care Coordination Goals: Patient will take blood pressure/heart rate logs to provider visits for review Patient will reach out to cardiologist with any symptoms of Afib Patient will reach out to RN Care Manager with any care coordination or resource needs [phone]         SDOH assessments and interventions completed:  Yes SDOH Interventions Today    Flowsheet Row Most Recent Value  SDOH Interventions   Transportation Interventions Intervention Not Indicated  Financial Strain Interventions Intervention Not Indicated       Care Coordination Interventions:  Yes, provided  Interventions Today    Flowsheet Row Most Recent Value  Chronic Disease   Chronic disease during today's visit Atrial Fibrillation (AFib)  General Interventions   General Interventions Discussed/Reviewed General Interventions Discussed, General Interventions Reviewed, Doctor Visits  [reviewed echocardiogram results. Patient has an Appt with cardiologist, Dr Kyle [name], on 12/25/22 to discuss results.]  Doctor Visits Discussed/Reviewed Doctor Visits Discussed, Doctor Visits Reviewed, PCP, Specialist  Kyle [name] and discussed recent office visit with cardiologist, Dr Kyle [name]  PCP/Specialist Visits Compliance with follow-up visit  [Dr Branch 12/25/22, Dr Terrace Arabia 12/28/22]  Exercise Interventions   Exercise Discussed/Reviewed Physical Activity, Exercise Discussed, Exercise Reviewed  Physical Activity Discussed/Reviewed Physical Activity  Reviewed, Physical Activity Discussed  [Able to perform ADLs. Active around home and yard daily.]  Education Interventions   Education Provided Provided Education  Provided Verbal Education On When to see the doctor, Medication, Exercise  [blood pressure and heart rate monitoring]  Mental Health Interventions   Mental Health Discussed/Reviewed Mental Health Discussed, Mental Health Reviewed  Nutrition Interventions   Nutrition Discussed/Reviewed Nutrition Discussed, Nutrition Reviewed  Pharmacy Interventions   Pharmacy Dicussed/Reviewed Pharmacy Topics Discussed, Pharmacy Topics Reviewed, Medications and their functions  [taking medications as directed. No concerns at this time. Amiodarone for afib. Not on a beta blocker or blood thinner. Not a candidate for the Watchman device b/c of allergy to contrast die]  Safety Interventions   Safety Discussed/Reviewed Safety Discussed, Safety Reviewed, Fall Risk, Home Safety       Follow up plan: Follow up call scheduled for 12/29/22    Encounter Outcome:  Pt. Visit Completed   Demetrios Loll, RN, BSN Care Management Coordinator [facility]  [facility] Direct Dial: [phone] Main #: [phone]

## 2022-12-25 ENCOUNTER — Ambulatory Visit: Payer: Medicare Other | Admitting: Cardiology

## 2022-12-25 ENCOUNTER — Ambulatory Visit: Payer: Medicare Other | Attending: Cardiology | Admitting: Cardiology

## 2022-12-25 ENCOUNTER — Encounter: Payer: Self-pay | Admitting: Cardiology

## 2022-12-25 VITALS — BP 136/58 | HR 56 | Ht 67.0 in | Wt 156.0 lb

## 2022-12-25 DIAGNOSIS — I350 Nonrheumatic aortic (valve) stenosis: Secondary | ICD-10-CM

## 2022-12-25 DIAGNOSIS — I4892 Unspecified atrial flutter: Secondary | ICD-10-CM | POA: Diagnosis not present

## 2022-12-25 NOTE — Patient Instructions (Addendum)
Medication Instructions:  Your physician recommends that you continue on your current medications as directed. Please refer to the Current Medication list given to you today.  *If you need a refill on your cardiac medications before your next appointment, please call your pharmacy*   Lab Work: None If you have labs (blood work) drawn today and your tests are completely normal, you will receive your results only by: MyChart Message (if you have MyChart) OR A paper copy in the mail If you have any lab test that is abnormal or we need to change your treatment, we will call you to review the results.   Testing/Procedures: IN JANUARY:  Your physician has requested that you have an echocardiogram. Echocardiography is a painless test that uses sound waves to create images of your heart. It provides your doctor with information about the size and shape of your heart and how well your heart's chambers and valves are working. This procedure takes approximately one hour. There are no restrictions for this procedure. Please do NOT wear cologne, perfume, aftershave, or lotions (deodorant is allowed). Please arrive 15 minutes prior to your appointment time.    Follow-Up: At [facility], you and your health needs are our priority.  As part of our continuing mission to provide you with exceptional heart care, we have created designated Provider Care Teams.  These Care Teams include your primary Cardiologist (physician) and Advanced Practice Providers (APPs -  Physician Assistants and Nurse Practitioners) who all work together to provide you with the care you need, when you need it.  We recommend signing up for the patient portal called "MyChart".  Sign up information is provided on this After Visit Summary.  MyChart is used to connect with patients for Virtual Visits (Telemedicine).  Patients are able to view lab/test results, encounter notes, upcoming appointments, etc.  Non-urgent messages can  be sent to your provider as well.   To learn more about what you can do with MyChart, go to [email or web address].    Your next appointment:    Keep scheduled follow up   Provider:   You may see Dina Rich, MD or one of the following Advanced Practice Providers on your designated Care Team:   Randall An, PA-C  Jacolyn Reedy, [place]     Other Instructions

## 2022-12-25 NOTE — Progress Notes (Signed)
Clinical Summary Kyle [name] is a 88 y.o.male seen today for follow up of the following medical problems.   This is a focused visit for aortic stenosis.  1. Aortic stenosis - 09/2020 echo LVEF 65-70%, AV mean grad 10 DI 0.44 AVA VTI 1.39 SVI 32  10/2021 echo LVEF 70-75%, grade II dd, mod AS mean grad 24 DI 0.36 AVA VTI 1.12 - denies any SOB/DOE, no chest pain   10/2022 echo: LVEF 60-65%, indet diastolic fxn, severe AS mean grad 32, AVA VTI 0.59, DI 0.20, SVI 29 - no chest pains, no SOB/DOE, no syncope - does heavy yardwork: weed eating x 15 minutes. Some limited by chronic leg weakness, has myasthenia gravis   Other medical problems not addressed this visit  1.Atrial flutter - 11/07/20 DCCV. HRs low after conversion, diltiazem was stopped and continued on toprol 25mg . Toprol lowered to 12.5mg  daily after ongoing bradycardia and then later stopped   -recurrent aflutter at 11/13/21 appt,  - 11/20/21 succesful DCCV to SR. Low HRs after conversion, toprol was stopped - 12/24/21 nursing visit EKG back in aflutter rate 93 -12/26/21 ER visit DCCV again to SR   -DCCV in ER 03/01/22 - amio started 03/03/22 - has maintained SR since then, no recent symptoms    - evaluated for watchman, not able to proceed due to severe contrast allergy  -Dr Lalla Brothers had suggested if patient interested referring him to Dr. Delia Chimes to consider thoracoscopic, off-pump left atrial appendage occlusion/clipping.  Patient does have interest, debilitating arthritis unable to take NSAIDs on eliquis.   -s/p thorascopic closure left atrial appendage by Dr Delia Chimes 04/20/22. Has done well        06/2022 monitor: 14 day monitor, rare ectopy, 12 runs SVT longest 9 beats, no symptoms reported  - no recent symptoms.  - avg heart rates at home 60s. Home bp cuff does report some irregular beats at times 07/2022 normal TFTs and LFTs     -dye allergy: severe skin rash, reports premedications do not prevent. Essentially like a very  bad sunburn, miserably pruritic       2,. HTN - he is compliant with meds - recently started norvasc 2.5mg  daily.  - home bp's 130s-150s/70s - recent leg swelling since starting norvasc. He says he thinks years ago was tried on a medication for bp that also causes swelling but not sure of the name.    Home bp's 130s/70s - off chlortahlidone, admit 05/2022 with dehydration and AKI     4. Aortic stenosis - 09/2020 echo LVEF 65-70%, AV mean grad 10 DI 0.44 AVA VTI 1.39 SVI 32  10/2021 echo LVEF 70-75%, grade II dd, mod AS mean grad 24 DI 0.36 AVA VTI 1.12 - denies any SOB/DOE, no chest pain   10/2022 echo: LVEF 60-65%, indet diastolic fxn, severe AS mean grad 32, AVA VTI 0.59, DI 0.20, SVI 29 - no chest pains, no SOB/DOE, no syncope - does heavy yardwork: weed eating x 15 minutes. Some limited by chronic leg weakness, has myasthenia gravis   5. Hyperlipidemia - on simvastatin - 07/2022 TC 173 TG 84 HDL 88 LDL 70 Past Medical History:  Diagnosis Date   Arthritis    Atrial fibrillation (HCC)    Auto immune neutropenia (HCC)    Bullous pemphigoid    Cancer (HCC)    Prostate   Cervical spondylosis without myelopathy 01/22/2016   Chronic kidney disease    Per MD note 05/2021 CKD stage non specified  Complication of anesthesia    difficult to wake up after anesthesia   DVT (deep venous thrombosis) (HCC)    a. Has had a previous right lower extremity DVT in the setting of hospitalization and surgery (after his brain tumor was resected in 1993) but is no longer on Coumadin.   Dyslipidemia    Dysrhythmia    History of kidney stones    History of prostate cancer    Hypertension    Meningioma (HCC)    a. s/p resection 1990s.   Myasthenia (HCC) 02/14/2013   Nocturnal leg cramps    Ocular myasthenia gravis (HCC)    Peripheral neuropathy 11/23/2018   Seizures (HCC)    Sinus bradycardia    Sleep paralysis    Thyroid nodule, cold      Allergies  Allergen Reactions   Iodinated  Contrast Media Hives     pt was premedicated/hives reaction occurred 24 hours post injection, Onset Date: [id number]    Sulfa Antibiotics Hives, Swelling and Rash   Bactericin [Bacitracin] Other (See Comments)    Unknown reaction    Norvasc [Amlodipine] Swelling    Leg swelling     Current Outpatient Medications  Medication Sig Dispense Refill   amiodarone (PACERONE) 200 MG tablet Take 200 mg Daily on Saturday, Sunday Take 100 mg Daily Monday -Friday 90 tablet 3   cholecalciferol (VITAMIN D3) 25 MCG (1000 UNIT) tablet Take 1,000 Units by mouth daily.     clobetasol (TEMOVATE) 0.05 % external solution Apply 1 Application topically 2 (two) times daily as needed (blisters).     clobetasol cream (TEMOVATE) 0.05 % Apply 1 application  topically 2 (two) times daily as needed (blisters).     fish oil-omega-3 fatty acids 1000 MG capsule Take 1 g by mouth 4 (four) times daily.     Flaxseed, Linseed, (FLAXSEED OIL PO) Take 1 capsule by mouth.     gabapentin (NEURONTIN) 600 MG tablet Take 1 tablet (600 mg total) by mouth daily. (Patient taking differently: Take 600 mg by mouth at bedtime.) 90 tablet 3   Garlic (GARLIQUE PO) Take 1 tablet by mouth daily.     HYDROcodone-acetaminophen (NORCO/VICODIN) 5-325 MG tablet Take 1 tablet by mouth 2 (two) times daily.     levETIRAcetam (KEPPRA) 500 MG tablet Take 1/2 tablet in the morning, take 1 tablet in the evening 135 tablet 3   losartan (COZAAR) 50 MG tablet Take 1 tablet (50 mg total) by mouth daily.     niacinamide 500 MG tablet Take 500 mg by mouth 3 (three) times daily.     Polyethyl Glycol-Propyl Glycol 0.4-0.3 % SOLN Place 1 drop into both eyes 3 (three) times daily.     pyridostigmine (MESTINON) 60 MG tablet Take 90 mg by mouth 3 (three) times daily.     simvastatin (ZOCOR) 20 MG tablet Take 10 mg by mouth at bedtime.     tiZANidine (ZANAFLEX) 4 MG tablet Take 4 mg by mouth at bedtime.     No current facility-administered medications for this  visit.     Past Surgical History:  Procedure Laterality Date   BLADDER SURGERY     removed part of the bladder   BRAIN SURGERY     CARDIOVERSION N/A 11/07/2020   Procedure: CARDIOVERSION;  Surgeon: Antoine Poche, MD;  Location: [facility];  Service: Endoscopy;  Laterality: N/A;   CARDIOVERSION N/A 11/20/2021   Procedure: CARDIOVERSION;  Surgeon: Antoine Poche, MD;  Location: [facility];  Service: Endoscopy;  Laterality: N/A;   CHOLECYSTECTOMY     CLIPPING OF ATRIAL APPENDAGE N/A 04/20/2022   Procedure: CLIPPING OF ATRIAL APPENDAGE USING 45 ATRIAL CLIP;  Surgeon: Lyn Hollingshead, MD;  Location: [facility];  Service: Open Heart Surgery;  Laterality: N/A;   COLONOSCOPY N/A 07/25/2013   Procedure: COLONOSCOPY;  Surgeon: Dalia Heading, MD;  Location: [facility];  Service: Gastroenterology;  Laterality: N/A;   COLONOSCOPY N/A 11/10/2016   Procedure: COLONOSCOPY;  Surgeon: Franky Macho, MD;  Location: [facility];  Service: Gastroenterology;  Laterality: N/A;   IR CATHETER TUBE CHANGE  01/02/2017   IR RADIOLOGIST EVAL & MGMT  01/06/2017   IR RADIOLOGIST EVAL & MGMT  01/20/2017   IR RADIOLOGIST EVAL & MGMT  02/03/2017   IR RADIOLOGIST EVAL & MGMT  02/17/2017   IR SINUS/FIST TUBE CHK-NON GI  03/03/2017   PARTIAL COLECTOMY     RADIOACTIVE SEED IMPLANT     TEE WITHOUT CARDIOVERSION N/A 04/20/2022   Procedure: TRANSESOPHAGEAL ECHOCARDIOGRAM (TEE);  Surgeon: Lyn Hollingshead, MD;  Location: [facility] OR;  Service: Open Heart Surgery;  Laterality: N/A;   VIDEO ASSISTED THORACOSCOPY Left 04/20/2022   Procedure: VIDEO ASSISTED THORACOSCOPY;  Surgeon: Lyn Hollingshead, MD;  Location: [facility] OR;  Service: Thoracic;  Laterality: Left;     Allergies  Allergen Reactions   Iodinated Contrast Media Hives     pt was premedicated/hives reaction occurred 24 hours post injection, Onset Date: [id number]    Sulfa Antibiotics Hives, Swelling and Rash   Bactericin [Bacitracin] Other (See Comments)    Unknown reaction     Norvasc [Amlodipine] Swelling    Leg swelling      Family History  Problem Relation Age of Onset   Heart failure Mother    Diabetes Mother    Diabetes Sister    Dementia Sister    Lung cancer Son    Heart failure Daughter    Colon cancer Neg Hx      Social History Mr. Melon reports that he has never smoked. He has never been exposed to tobacco smoke. He has never used smokeless tobacco. Mr. Brisky reports no history of alcohol use.   Review of Systems CONSTITUTIONAL: No weight loss, fever, chills, weakness or fatigue.  HEENT: Eyes: No visual loss, blurred vision, double vision or yellow sclerae.No hearing loss, sneezing, congestion, runny nose or sore throat.  SKIN: No rash or itching.  CARDIOVASCULAR: per hpi RESPIRATORY: No shortness of breath, cough or sputum.  GASTROINTESTINAL: No anorexia, nausea, vomiting or diarrhea. No abdominal pain or blood.  GENITOURINARY: No burning on urination, no polyuria NEUROLOGICAL: No headache, dizziness, syncope, paralysis, ataxia, numbness or tingling in the extremities. No change in bowel or bladder control.  MUSCULOSKELETAL: No muscle, back pain, joint pain or stiffness.  LYMPHATICS: No enlarged nodes. No history of splenectomy.  PSYCHIATRIC: No history of depression or anxiety.  ENDOCRINOLOGIC: No reports of sweating, cold or heat intolerance. No polyuria or polydipsia.  Marland Kitchen   Physical Examination Today's Vitals   12/25/22 1248  BP: (!) 136/58  Pulse: (!) 56  SpO2: 97%  Weight: 156 lb (70.8 kg)  Height: 5\' 7"  (1.702 m)   Body mass index is 24.43 kg/m.  Gen: resting comfortably, no acute distress HEENT: no scleral icterus, pupils equal round and reactive, no palptable cervical adenopathy,  CV: RRR, 3/6 systolc murmur rusb, no jvd Resp: Clear to auscultation bilaterally GI: abdomen is soft, non-tender, non-distended, normal bowel sounds, no hepatosplenomegaly MSK: extremities are  warm, no edema.  Skin: warm, no  rash Neuro:  no focal deficits Psych: appropriate affect   Diagnostic Studies  10/2022 echo 1. Left ventricular ejection fraction, by estimation, is 60 to 65%. The  left ventricle has normal function. The left ventricle has no regional  wall motion abnormalities. There is moderate concentric left ventricular  hypertrophy. Left ventricular  diastolic parameters are indeterminate.   2. Right ventricular systolic function is normal. The right ventricular  size is normal. There is moderately elevated pulmonary artery systolic  pressure. The estimated right ventricular systolic pressure is 50.1 mmHg.   3. Left atrial size was severely dilated.   4. The mitral valve is grossly normal. Mild mitral valve regurgitation.  No evidence of mitral stenosis.   5. The tricuspid valve is abnormal. Tricuspid valve regurgitation is mild  to moderate.   6. The aortic valve is tricuspid. There is severe calcifcation of the  aortic valve. Aortic valve regurgitation is mild. Severe aortic valve  stenosis. Aortic valve area, by VTI measures 0.59 cm. Aortic valve mean  gradient measures 32.0 mmHg. Aortic  valve Vmax measures 3.70 m/s. DVI is 0.20.   7. Aortic dilatation noted. There is borderline dilatation of the  ascending aorta, measuring 36 mm. Normal aortic root.   8. The inferior vena cava is normal in size with greater than 50%  respiratory variability, suggesting right atrial pressure of 3 mmHg.    Assessment and Plan   1.Aortic stenosis - asymptomatic severe AS with normal LVEF - continue to monitor at this time, repeat echo in January - severe dye allergy refractory to premedication, will reviewe chart further. Can see at least once where he had a CT abdomen with contrast 01/06/17 with severe rash after despite premedications. Will review chart further, will be a challenge if we get to a point we have to consider TAVR. Unclear if a more aggressive prep strategy may be more effective. Describes  rash like a severe sunburn that was painful and itchy, skin peeled off   F/u Jan 2025     Antoine Poche, M.D.

## 2022-12-28 ENCOUNTER — Ambulatory Visit: Payer: Medicare Other | Admitting: Neurology

## 2022-12-28 ENCOUNTER — Encounter: Payer: Self-pay | Admitting: Neurology

## 2022-12-28 VITALS — BP 151/70 | HR 53 | Ht 66.5 in | Wt 158.0 lb

## 2022-12-28 DIAGNOSIS — R269 Unspecified abnormalities of gait and mobility: Secondary | ICD-10-CM

## 2022-12-28 DIAGNOSIS — G7000 Myasthenia gravis without (acute) exacerbation: Secondary | ICD-10-CM | POA: Diagnosis not present

## 2022-12-28 DIAGNOSIS — M5416 Radiculopathy, lumbar region: Secondary | ICD-10-CM

## 2022-12-28 DIAGNOSIS — R001 Bradycardia, unspecified: Secondary | ICD-10-CM

## 2022-12-28 MED ORDER — GABAPENTIN 600 MG PO TABS: 600.0000 mg | ORAL_TABLET | Freq: Every day | ORAL | 3 refills | Status: DC

## 2022-12-28 MED ORDER — LEVETIRACETAM 500 MG PO TABS: ORAL_TABLET | ORAL | 3 refills | Status: DC

## 2022-12-28 MED ORDER — PYRIDOSTIGMINE BROMIDE 60 MG PO TABS: 60.0000 mg | ORAL_TABLET | Freq: Three times a day (TID) | ORAL | 3 refills | Status: DC | PRN

## 2022-12-28 NOTE — Progress Notes (Signed)
Chief Complaint  Patient presents with   Follow-up     Rm13, alone, Myasthenia Gait abnormality Lumbar radiculopathy: pt stated that his main concern is bilateral leg weakness and gait getting worse      ASSESSMENT AND PLAN  Kyle [name]. is a 88 y.o. male   History of left frontal craniotomy for benign left frontal tumor in 1993, with evidence of left frontal encephalomalacia Complex partial seizure  Doing well keep current Keppra 500 mg half tablet in the morning/1 at night,  Lumbar radiculopathy, chronic low back pain, gait abnormality  The pain management, not a surgical candidate, taking gabapentin 600 mg every night, hydrocodone as needed  History of ocular myasthenia gravis,  No significant extraocular muscle weakness detected at today's visit,  Bradykinesia, no change after Mestinon dose was decreased from 90 mg 4 times a day through 3 times a day, advised him further decrease to take it only as needed  Complains of worsening gait abnormality,  Multifactorial, aging, deconditioning, lumbar radiculopathy, worsening aortic stenosis,   DIAGNOSTIC DATA (LABS, IMAGING, TESTING) - I reviewed patient records, labs, notes, testing and imaging myself where available.   MEDICAL HISTORY:  Kyle [name]., is a 88 year old male, follow-up on his neurological condition, ocular myasthenia gravis, complex partial seizure, history of left frontal craniotomy, left lumbar radiculopathy, chronic low back pain, his primary care physician is Dr. Elfredia Nevins, MD  I reviewed and summarized the referring note. PMHx. A fib DVT Left Craniotomy in 1993,  Partial seizure HTN Oculomyasthenia gravis  Patient had a history of left frontal craniotomy for benign brain tumor in 1993, had a complex partial seizure symptoms, doing well taking, and Keppra 500 mg half in the morning, 1 at night, has not had recurrent seizure for many years, described seizure preceded by feeling weird,  then whole body shaking  Personally reviewed CT head without contrast 2014, MRI of the brain with without contrast February 2007,  left craniotomy with a cortical/subcortical resection of the posterior superior left frontal mass, with associated left frontal encephalomalacia  He had a significant arthritis disease, chronic low back pain, radiating pain to bilateral lower extremity, frequent bilateral lower extremity muscle cramp, is under pain management Dr. Ethelene Hal, personally reviewed MRI of lumbar in June 2023, multilevel degenerative changes, scoliosis,  moderate spinal and right foraminal stenosis, left L4-5 foraminal impingement, L5-S1 left paracentral disc protrusion impinging on the left S1 nerve roots  He was seen by neurosurgeon, deemed not to be a surgical candidate, now taking gabapentin 600 mg every night, tizanidine as needed, failed multiple epidural injection recently, taking hydrocodone low-dose  He also carries a diagnosis of ocular myasthenia gravis, reported transient double vision, response to Mestinon, is on relative high dose 60 mg 1 and half tablets 4 times a day, denies significant side effect, but no longer have double vision, denies bulbar weakness, mild gait abnormality due to his chronic low back problem, multiple hands joint pain, deformity, mild grip weakness  History of atrial fibrillation, on chronic anticoagulation, no longer a candidate for NSAIDs, recent evaluation for possible watchman's procedure, was aborted due to reported history of severe allergic reaction to iodine,  UPDATE Sept 16th 2024: His Mestinon was decreased from 90 tablets 4 times a day to 3 times a day, he did not notice any difference, in specific, no diplopia, no dysarthria, dysphagia,  He has chronic low back pain, taking hydrocodone as needed, slow worsening gait abnormality  He was recently seen  by cardiologist for severe aortic stenosis,   PHYSICAL EXAM:   Vitals:   12/28/22 0845  BP:  (!) 151/70  Pulse: (!) 53  Weight: 158 lb (71.7 kg)  Height: 5' 6.5" (1.689 m)   Not recorded     Body mass index is 25.12 kg/m.  PHYSICAL EXAMNIATION:  Gen: NAD, conversant, well nourised, well groomed                     Cardiovascular: Regular rate rhythm, no peripheral edema, warm, nontender. Eyes: Conjunctivae clear without exudates or hemorrhage Neck: Supple, no carotid bruits. Pulmonary: Clear to auscultation bilaterally   NEUROLOGICAL EXAM:  MENTAL STATUS: Speech/cognition: Awake, alert, oriented to history taking and casual conversation CRANIAL NERVES: CN II: Visual fields are full to confrontation. Pupils are round equal and briskly reactive to light. CN III, IV, VI: extraocular movement are normal. No ptosis.  Cover and uncover showed no significant extraocular muscle weakness CN V: Facial sensation is intact to light touch CN VII: Face is symmetric with normal eye closure, normal cheek puff, CN VIII: Hearing is normal to causal conversation. CN IX, X: Phonation is normal. CN XI: Head turning and shoulder shrug are intact  MOTOR: Deformity of multiple joints at bilateral hands, bilateral hip flexion weakness, no significant ankle plantarflexion weakness REFLEXES: Reflexes are 1 and symmetric at the biceps, triceps, knees, and absent at ankles. Plantar responses are flexor.  SENSORY: Mild length-dependent decreased light touch, pinprick to distal shin level  COORDINATION: There is no trunk or limb dysmetria noted.  GAIT/STANCE: Need push up to get up from seated position, antalgic, mildly unsteady  REVIEW OF SYSTEMS:  Full 14 system review of systems performed and notable only for as above All other review of systems were negative.   ALLERGIES: Allergies  Allergen Reactions   Iodinated Contrast Media Hives     pt was premedicated/hives reaction occurred 24 hours post injection, Onset Date: [id number]    Sulfa Antibiotics Hives, Swelling and Rash    Bactericin [Bacitracin] Other (See Comments)    Unknown reaction    Norvasc [Amlodipine] Swelling    Leg swelling    HOME MEDICATIONS: Current Outpatient Medications  Medication Sig Dispense Refill   amiodarone (PACERONE) 200 MG tablet Take 200 mg Daily on Saturday, Sunday Take 100 mg Daily Monday -Friday 90 tablet 3   cholecalciferol (VITAMIN D3) 25 MCG (1000 UNIT) tablet Take 1,000 Units by mouth daily.     clobetasol (TEMOVATE) 0.05 % external solution Apply 1 Application topically 2 (two) times daily as needed (blisters).     clobetasol cream (TEMOVATE) 0.05 % Apply 1 application  topically 2 (two) times daily as needed (blisters).     fish oil-omega-3 fatty acids 1000 MG capsule Take 1 g by mouth 4 (four) times daily.     Flaxseed, Linseed, (FLAXSEED OIL PO) Take 1 capsule by mouth.     gabapentin (NEURONTIN) 600 MG tablet Take 1 tablet (600 mg total) by mouth daily. (Patient taking differently: Take 600 mg by mouth at bedtime.) 90 tablet 3   Garlic (GARLIQUE PO) Take 1 tablet by mouth daily.     HYDROcodone-acetaminophen (NORCO/VICODIN) 5-325 MG tablet Take 1 tablet by mouth 2 (two) times daily.     levETIRAcetam (KEPPRA) 500 MG tablet Take 1/2 tablet in the morning, take 1 tablet in the evening 135 tablet 3   losartan (COZAAR) 50 MG tablet Take 1 tablet (50 mg total) by mouth daily.  niacinamide 500 MG tablet Take 500 mg by mouth 3 (three) times daily.     Polyethyl Glycol-Propyl Glycol 0.4-0.3 % SOLN Place 1 drop into both eyes 3 (three) times daily.     pyridostigmine (MESTINON) 60 MG tablet Take 90 mg by mouth 3 (three) times daily.     simvastatin (ZOCOR) 20 MG tablet Take 10 mg by mouth at bedtime.     tiZANidine (ZANAFLEX) 4 MG tablet Take 4 mg by mouth at bedtime.     No current facility-administered medications for this visit.    PAST MEDICAL HISTORY: Past Medical History:  Diagnosis Date   Arthritis    Atrial fibrillation (HCC)    Auto immune neutropenia (HCC)     Bullous pemphigoid    Cancer (HCC)    Prostate   Cervical spondylosis without myelopathy 01/22/2016   Chronic kidney disease    Per MD note 05/2021 CKD stage non specified   Complication of anesthesia    difficult to wake up after anesthesia   DVT (deep venous thrombosis) (HCC)    a. Has had a previous right lower extremity DVT in the setting of hospitalization and surgery (after his brain tumor was resected in 1993) but is no longer on Coumadin.   Dyslipidemia    Dysrhythmia    History of kidney stones    History of prostate cancer    Hypertension    Meningioma (HCC)    a. s/p resection 1990s.   Myasthenia (HCC) 02/14/2013   Nocturnal leg cramps    Ocular myasthenia gravis (HCC)    Peripheral neuropathy 11/23/2018   Seizures (HCC)    Sinus bradycardia    Sleep paralysis    Thyroid nodule, cold     PAST SURGICAL HISTORY: Past Surgical History:  Procedure Laterality Date   BLADDER SURGERY     removed part of the bladder   BRAIN SURGERY     CARDIOVERSION N/A 11/07/2020   Procedure: CARDIOVERSION;  Surgeon: Antoine Poche, MD;  Location: [facility];  Service: Endoscopy;  Laterality: N/A;   CARDIOVERSION N/A 11/20/2021   Procedure: CARDIOVERSION;  Surgeon: Antoine Poche, MD;  Location: [facility];  Service: Endoscopy;  Laterality: N/A;   CHOLECYSTECTOMY     CLIPPING OF ATRIAL APPENDAGE N/A 04/20/2022   Procedure: CLIPPING OF ATRIAL APPENDAGE USING 45 ATRIAL CLIP;  Surgeon: Lyn Hollingshead, MD;  Location: [facility] OR;  Service: Open Heart Surgery;  Laterality: N/A;   COLONOSCOPY N/A 07/25/2013   Procedure: COLONOSCOPY;  Surgeon: Dalia Heading, MD;  Location: [facility];  Service: Gastroenterology;  Laterality: N/A;   COLONOSCOPY N/A 11/10/2016   Procedure: COLONOSCOPY;  Surgeon: Franky Macho, MD;  Location: [facility];  Service: Gastroenterology;  Laterality: N/A;   IR CATHETER TUBE CHANGE  01/02/2017   IR RADIOLOGIST EVAL & MGMT  01/06/2017   IR RADIOLOGIST EVAL & MGMT   01/20/2017   IR RADIOLOGIST EVAL & MGMT  02/03/2017   IR RADIOLOGIST EVAL & MGMT  02/17/2017   IR SINUS/FIST TUBE CHK-NON GI  03/03/2017   PARTIAL COLECTOMY     RADIOACTIVE SEED IMPLANT     TEE WITHOUT CARDIOVERSION N/A 04/20/2022   Procedure: TRANSESOPHAGEAL ECHOCARDIOGRAM (TEE);  Surgeon: Lyn Hollingshead, MD;  Location: [facility] OR;  Service: Open Heart Surgery;  Laterality: N/A;   VIDEO ASSISTED THORACOSCOPY Left 04/20/2022   Procedure: VIDEO ASSISTED THORACOSCOPY;  Surgeon: Lyn Hollingshead, MD;  Location: [facility] OR;  Service: Thoracic;  Laterality: Left;  FAMILY HISTORY: Family History  Problem Relation Age of Onset   Heart failure Mother    Diabetes Mother    Diabetes Sister    Dementia Sister    Lung cancer Son    Heart failure Daughter    Colon cancer Neg Hx     SOCIAL HISTORY: Social History   Socioeconomic History   Marital status: Married    Spouse name: Diplomatic Services operational officer   Number of children: 1   Years of education: 12 TH   Highest education level: Not on file  Occupational History   Occupation: retired  Tobacco Use   Smoking status: Never    Passive exposure: Never   Smokeless tobacco: Never  Vaping Use   Vaping status: Never Used  Substance and Sexual Activity   Alcohol use: No   Drug use: No   Sexual activity: Not on file  Other Topics Concern   Not on file  Social History Narrative   Patient is right handed.   Patient drinks 1 cup of coffee daily.   Social Determinants of Health   Financial Resource Strain: Low Risk  (12/15/2022)   Overall Financial Resource Strain (CARDIA)    Difficulty of Paying Living Expenses: Not hard at all  Food Insecurity: Low Risk  (12/17/2022)   Received from [facility]   Hunger Vital Sign    Worried About Running Out of Food in the Last Year: Never true    Ran Out of Food in the Last Year: Never true  Transportation Needs: No Transportation Needs (12/17/2022)   Received from [facility]    In the past 12 months, has  lack of reliable transportation kept you from medical appointments, meetings, work or from getting things needed for daily living? : No  Physical Activity: Sufficiently Active (11/09/2022)   Exercise Vital Sign    Days of Exercise per Week: 7 days    Minutes of Exercise per Session: 30 min  Stress: Not on file  Social Connections: Not on file  Intimate Partner Violence: Not At Risk (06/09/2022)   Humiliation, Afraid, Rape, and Kick questionnaire    Fear of Current or Ex-Partner: No    Emotionally Abused: No    Physically Abused: No    Sexually Abused: No      Levert Feinstein, M.D. Ph.D.  [facility] [address], [address] [place], [place] [address] Ph: [phone] Fax: [phone]  CC:  Elfredia Nevins, MD [address] [place],  [place] [address]  Elfredia Nevins, MD

## 2022-12-29 ENCOUNTER — Ambulatory Visit: Payer: Self-pay | Admitting: *Deleted

## 2022-12-29 NOTE — Patient Outreach (Signed)
Care Coordination   12/29/2022 Name: Kyle [name]. MRN: [id number] DOB: 31-Oct-1934   Care Coordination Outreach Attempts:  An unsuccessful telephone outreach was attempted for a scheduled appointment today.  Follow Up Plan:  Additional outreach attempts will be made to offer the patient care coordination information and services.   Encounter Outcome:  No Answer. Left HIPAA compliant VM.   Care Coordination Interventions:  No, not indicated    Demetrios Loll, RN, BSN Care Management Coordinator [facility]  [facility] Direct Dial: [phone] Main #: [phone]

## 2023-01-01 ENCOUNTER — Ambulatory Visit: Payer: Self-pay | Admitting: *Deleted

## 2023-01-01 NOTE — Patient Outreach (Signed)
Care Coordination   01/01/2023 Name: Moss Seurer. MRN: [id number] DOB: December 05, 1934   Care Coordination Outreach Attempts:  An unsuccessful telephone outreach was attempted for a scheduled appointment today.  Follow Up Plan:  Additional outreach attempts will be made to offer the patient care coordination information and services.   Encounter Outcome:  No Answer   Care Coordination Interventions:  No, not indicated    Demetrios Loll, RN, BSN Care Management Coordinator [facility]  [facility] Direct Dial: [phone] Main #: [phone]

## 2023-01-10 ENCOUNTER — Other Ambulatory Visit: Payer: Self-pay

## 2023-01-10 ENCOUNTER — Emergency Department (HOSPITAL_COMMUNITY): Payer: Medicare Other

## 2023-01-10 ENCOUNTER — Encounter (HOSPITAL_COMMUNITY): Payer: Self-pay | Admitting: Emergency Medicine

## 2023-01-10 ENCOUNTER — Emergency Department (HOSPITAL_COMMUNITY)
Admission: EM | Admit: 2023-01-10 | Discharge: 2023-01-10 | Disposition: A | Discharge status: Home / Self Care | Payer: Medicare Other | Attending: Emergency Medicine | Admitting: Emergency Medicine

## 2023-01-10 DIAGNOSIS — Z7901 Long term (current) use of anticoagulants: Secondary | ICD-10-CM | POA: Insufficient documentation

## 2023-01-10 DIAGNOSIS — I129 Hypertensive chronic kidney disease with stage 1 through stage 4 chronic kidney disease, or unspecified chronic kidney disease: Secondary | ICD-10-CM | POA: Insufficient documentation

## 2023-01-10 DIAGNOSIS — R911 Solitary pulmonary nodule: Secondary | ICD-10-CM | POA: Diagnosis not present

## 2023-01-10 DIAGNOSIS — I4891 Unspecified atrial fibrillation: Secondary | ICD-10-CM | POA: Diagnosis not present

## 2023-01-10 DIAGNOSIS — N189 Chronic kidney disease, unspecified: Secondary | ICD-10-CM | POA: Diagnosis not present

## 2023-01-10 DIAGNOSIS — R918 Other nonspecific abnormal finding of lung field: Secondary | ICD-10-CM | POA: Diagnosis not present

## 2023-01-10 DIAGNOSIS — Z8546 Personal history of malignant neoplasm of prostate: Secondary | ICD-10-CM | POA: Insufficient documentation

## 2023-01-10 DIAGNOSIS — Z79899 Other long term (current) drug therapy: Secondary | ICD-10-CM | POA: Diagnosis not present

## 2023-01-10 DIAGNOSIS — E041 Nontoxic single thyroid nodule: Secondary | ICD-10-CM | POA: Diagnosis not present

## 2023-01-10 DIAGNOSIS — I4892 Unspecified atrial flutter: Secondary | ICD-10-CM | POA: Insufficient documentation

## 2023-01-10 DIAGNOSIS — R002 Palpitations: Secondary | ICD-10-CM | POA: Diagnosis present

## 2023-01-10 LAB — CBC
HCT: 39.7 % (ref 39.0–52.0)
Hemoglobin: 13 g/dL (ref 13.0–17.0)
MCH: 31.2 pg (ref 26.0–34.0)
MCHC: 32.7 g/dL (ref 30.0–36.0)
MCV: 95.2 fL (ref 80.0–100.0)
Platelets: 202 10*3/uL (ref 150–400)
RBC: 4.17 MIL/uL — ABNORMAL LOW (ref 4.22–5.81)
RDW: 13.1 % (ref 11.5–15.5)
WBC: 7.5 10*3/uL (ref 4.0–10.5)
nRBC: 0 % (ref 0.0–0.2)

## 2023-01-10 LAB — BASIC METABOLIC PANEL
Anion gap: 10 (ref 5–15)
BUN: 23 mg/dL (ref 8–23)
CO2: 20 mmol/L — ABNORMAL LOW (ref 22–32)
Calcium: 9.3 mg/dL (ref 8.9–10.3)
Chloride: 107 mmol/L (ref 98–111)
Creatinine, Ser: 1.02 mg/dL (ref 0.61–1.24)
GFR, Estimated: >60 mL/min (ref >60)
Glucose, Bld: 125 mg/dL — ABNORMAL HIGH (ref 70–99)
Potassium: 4.1 mmol/L (ref 3.5–5.1)
Sodium: 137 mmol/L (ref 135–145)

## 2023-01-10 LAB — MAGNESIUM: Magnesium: 2 mg/dL (ref 1.7–2.4)

## 2023-01-10 MED ORDER — ETOMIDATE 2 MG/ML IV SOLN
0.1000 mg/kg | Freq: Once | INTRAVENOUS | Status: DC
  Filled 2023-01-10: qty 10

## 2023-01-10 MED ORDER — APIXABAN 5 MG PO TABS
5.0000 mg | ORAL_TABLET | Freq: Two times a day (BID) | ORAL | 0 refills | Status: DC
Start: 2023-01-10 — End: 2023-02-09

## 2023-01-10 MED ORDER — LACTATED RINGERS IV SOLN: INTRAVENOUS | Status: DC

## 2023-01-10 MED ORDER — ONDANSETRON HCL 4 MG/2ML IJ SOLN
4.0000 mg | Freq: Once | INTRAMUSCULAR | Status: AC
  Administered 2023-01-10: 4 mg via INTRAVENOUS
  Filled 2023-01-10: qty 2

## 2023-01-10 MED ORDER — ETOMIDATE 2 MG/ML IV SOLN
INTRAVENOUS | Status: AC | PRN
Start: 2023-01-10 — End: 2023-01-10
  Administered 2023-01-10: 7.18 mg via INTRAVENOUS

## 2023-01-10 NOTE — Sedation Documentation (Signed)
Cardioversion: synced, 200j, Dr. Durwin Nora remains present at [facility]. Afib HR 106 converted to NSR HR 56.

## 2023-01-10 NOTE — ED Notes (Signed)
Consents signed by wife at [facility]. RT present. Pt denies sx or complaints. Preparing for cardioversion.

## 2023-01-10 NOTE — ED Notes (Signed)
EDP and family x2 at [facility]

## 2023-01-10 NOTE — ED Notes (Signed)
Lab at [facility] Pt alert, NAD, calm, interactive.

## 2023-01-10 NOTE — ED Triage Notes (Signed)
Pt checkes bp and hr everymorning and states hr over 130. Pt states "I dont feel it". Pt denies chest pressure/tightness/pain, denies shob/dizziness/n. Pt a/o. Was last cardioverted nov 2023 and has been fine since. Color wnl. Non diaphoretic.

## 2023-01-10 NOTE — ED Notes (Signed)
Pt to CT by w/c, alert, NAD, calm, interactive, remains baseline

## 2023-01-10 NOTE — ED Provider Notes (Signed)
[place] EMERGENCY DEPARTMENT AT [facility] Provider Note   CSN: [id number] Arrival date & time: 01/10/23  1109     History  Chief Complaint  Patient presents with   Irregular Heart Beat    Kyle [name]. is a 88 y.o. male.  HPI Patient presents for tachycardia.  Medical history includes seizures, HTN, nephrolithiasis, prostate cancer, HLD, DVT, CKD, atrial flutter.   He has undergone DCCV for episodes of atrial flutter several times in the past.  Most recently was 11/23.  He was started on amiodarone at that time.  Following January, he underwent left atrial appendage clip.  He is no longer on blood thinner.  He is followed by [facility].  Last office visit was 2 weeks ago.  Plan for his aortic stenosis is monitoring with repeat echo in January.  He checks his blood pressure and heart rate every morning.  This morning, it was elevated in the range of 130.  He denied any sensation of palpitations.  He denies any shortness of breath or chest pain.    Home Medications Prior to Admission medications   Medication Sig Start Date End Date Taking? Authorizing Provider  apixaban (ELIQUIS) 5 MG TABS tablet Take 1 tablet (5 mg total) by mouth 2 (two) times daily. 01/10/23 02/09/23 Yes Gloris Manchester, MD  amiodarone (PACERONE) 200 MG tablet Take 200 mg Daily on Saturday, Sunday Take 100 mg Daily Monday -Friday 11/12/22   Marinus Maw, MD  cholecalciferol (VITAMIN D3) 25 MCG (1000 UNIT) tablet Take 1,000 Units by mouth daily.    [provider]  clobetasol (TEMOVATE) 0.05 % external solution Apply 1 Application topically 2 (two) times daily as needed (blisters).    [provider]  clobetasol cream (TEMOVATE) 0.05 % Apply 1 application  topically 2 (two) times daily as needed (blisters).    [provider]  fish oil-omega-3 fatty acids 1000 MG capsule Take 1 g by mouth 4 (four) times daily.    [provider]  Flaxseed, Linseed, (FLAXSEED OIL PO) Take 1  capsule by mouth.    [provider]  gabapentin (NEURONTIN) 600 MG tablet Take 1 tablet (600 mg total) by mouth daily. 12/28/22   Levert Feinstein, MD  Garlic (GARLIQUE PO) Take 1 tablet by mouth daily.    [provider]  HYDROcodone-acetaminophen (NORCO/VICODIN) 5-325 MG tablet Take 1 tablet by mouth 2 (two) times daily. 11/06/21   [provider]  levETIRAcetam (KEPPRA) 500 MG tablet Take 1/2 tablet in the morning, take 1 tablet in the evening 12/28/22   Levert Feinstein, MD  losartan (COZAAR) 50 MG tablet Take 1 tablet (50 mg total) by mouth daily. 06/15/22 06/10/23  Vassie Loll, MD  niacinamide 500 MG tablet Take 500 mg by mouth 3 (three) times daily.    [provider]  Polyethyl Glycol-Propyl Glycol 0.4-0.3 % SOLN Place 1 drop into both eyes 3 (three) times daily.    [provider]  pyridostigmine (MESTINON) 60 MG tablet Take 1 tablet (60 mg total) by mouth 3 (three) times daily as needed. 12/28/22   Levert Feinstein, MD  simvastatin (ZOCOR) 20 MG tablet Take 10 mg by mouth at bedtime. 10/24/21   [provider]  tiZANidine (ZANAFLEX) 4 MG tablet Take 4 mg by mouth at bedtime. 07/19/20   [provider]      Allergies    Iodinated contrast media, Sulfa antibiotics, Bactericin [bacitracin], and Norvasc [amlodipine]    Review of Systems  Review of Systems  Respiratory:  Negative for shortness of breath.   Cardiovascular:  Negative for chest pain and palpitations.  All other systems reviewed and are negative.   Physical Exam Updated Vital Signs BP (!) 132/91   Pulse (!) 50   Temp 98 F (36.7 C) (Oral)   Resp 18   SpO2 99%  Physical Exam Vitals and nursing note reviewed.  Constitutional:      General: He is not in acute distress.    Appearance: Normal appearance. He is well-developed. He is not ill-appearing, toxic-appearing or diaphoretic.  HENT:     Head: Normocephalic and atraumatic.     Right Ear: External ear normal.     Left  Ear: External ear normal.     Nose: Nose normal.     Mouth/Throat:     Mouth: Mucous membranes are moist.  Eyes:     Extraocular Movements: Extraocular movements intact.     Conjunctiva/sclera: Conjunctivae normal.  Cardiovascular:     Rate and Rhythm: Regular rhythm. Tachycardia present.     Heart sounds: No murmur heard. Pulmonary:     Effort: Pulmonary effort is normal. No respiratory distress.     Breath sounds: Normal breath sounds. No wheezing or rales.  Chest:     Chest wall: No tenderness.  Abdominal:     General: There is no distension.     Palpations: Abdomen is soft.     Tenderness: There is no abdominal tenderness.  Musculoskeletal:        General: No swelling. Normal range of motion.     Cervical back: Normal range of motion and neck supple.     Right lower leg: No edema.     Left lower leg: No edema.  Skin:    General: Skin is warm and dry.     Coloration: Skin is not jaundiced or pale.  Neurological:     General: No focal deficit present.     Mental Status: He is alert and oriented to person, place, and time.  Psychiatric:        Mood and Affect: Mood normal.        Behavior: Behavior normal.     ED Results / Procedures / Treatments   Labs (all labs ordered are listed, but only abnormal results are displayed) Labs Reviewed  BASIC METABOLIC PANEL - Abnormal; Notable for the following components:      Result Value   CO2 20 (*)    Glucose, Bld 125 (*)    All other components within normal limits  CBC - Abnormal; Notable for the following components:   RBC 4.17 (*)    All other components within normal limits  MAGNESIUM    EKG EKG Interpretation Date/Time:  Sunday January 10 2023 13:44:08 EDT Ventricular Rate:  56 PR Interval:  213 QRS Duration:  94 QT Interval:  412 QTC Calculation: 398 R Axis:   24  Text Interpretation: Sinus rhythm Ventricular premature complex Borderline prolonged PR interval Nonspecific repol abnormality, diffuse leads  Confirmed by Gloris Manchester [phone]) on 01/10/2023 2:19:38 PM  Radiology CT Chest Wo Contrast  Result Date: 01/10/2023 CLINICAL DATA:  Lung nodule. EXAM: CT CHEST WITHOUT CONTRAST TECHNIQUE: Multidetector CT imaging of the chest was performed following the standard protocol without IV contrast. RADIATION DOSE REDUCTION: This exam was performed according to the departmental dose-optimization program which includes automated exposure control, adjustment of the mA and/or kV according to patient size and/or use of iterative reconstruction technique. COMPARISON:  Chest x-ray earlier same day.  Chest CT 04/16/2022 FINDINGS: Cardiovascular: The heart size is normal. No substantial pericardial effusion. Coronary artery calcification is evident. Mild atherosclerotic calcification is noted in the wall of the thoracic aorta. Aortic valve calcification evident. Mediastinum/Nodes: Calcified mediastinal lymph nodes evident. Calcified nodal tissue is seen in the right hilum. The esophagus has normal imaging features. There is no axillary lymphadenopathy. 3.3 cm hypoattenuating nodule identified left thyroid lobe. Lungs/Pleura: No suspicious pulmonary nodule or mass. Specifically, there is no evidence for nodule or consolidative disease in the peripheral aspect of the left mid lung as questioned on the recent chest x-ray. No pulmonary edema or pleural effusion. Linear bands of subsegmental atelectasis or scarring noted in the inferior lungs bilaterally. Upper Abdomen: Visualized portion of the upper abdomen is unremarkable. Musculoskeletal: No worrisome lytic or sclerotic osseous abnormality. IMPRESSION: 1. No suspicious pulmonary nodule or mass. Specifically, there is no evidence for nodule or consolidative disease in the peripheral aspect of the left mid lung as questioned on the recent chest x-ray. 2. Linear bands of subsegmental atelectasis or scarring in the inferior lungs bilaterally. 3. Stable 3.3 cm nodule in the left thyroid  gland. Recommend thyroid US (ref: J Am Coll Radiol. 2015 Feb;12(2): 143-50). 4.  Aortic Atherosclerosis (ICD10-I70.0). Electronically Signed   By: Kennith Center M.D.   On: 01/10/2023 14:55   DG Chest Port 1 View  Result Date: 01/10/2023 CLINICAL DATA:  Atrial fibrillation. EXAM: PORTABLE CHEST 1 VIEW COMPARISON:  06/09/2022 FINDINGS: Lungs are hyperexpanded. Right lung clear. Subtle ill-defined nodular opacity identified left mid lung. Cardiopericardial silhouette is at upper limits of normal for size. Left atrial appendage occluded device noted. Bones are diffusely demineralized. Telemetry leads overlie the chest. IMPRESSION: Subtle ill-defined nodular opacity in the left mid lung. CT chest without contrast recommended to further evaluate. Electronically Signed   By: Kennith Center M.D.   On: 01/10/2023 12:45    Procedures .Sedation  Date/Time: 01/10/2023 1:52 PM  Performed by: Gloris Manchester, MD Authorized by: Gloris Manchester, MD   Consent:    Consent obtained:  Verbal   Consent given by:  Patient   Risks discussed:  Allergic reaction, prolonged hypoxia resulting in organ damage, dysrhythmia, inadequate sedation, respiratory compromise necessitating ventilatory assistance and intubation, nausea and vomiting   Alternatives discussed:  Analgesia without sedation Universal protocol:    Immediately prior to procedure, a time out was called: yes     Patient identity confirmed:  Verbally with patient Indications:    Procedure performed:  Cardioversion   Procedure necessitating sedation performed by:  Physician performing sedation Pre-sedation assessment:    Time since last food or drink:  6 hours   ASA classification: class 3 - patient with severe systemic disease     Mouth opening:  2 finger widths   Thyromental distance:  3 finger widths   Mallampati score:  III - soft palate, base of uvula visible   Neck mobility: normal     Pre-sedation assessments completed and reviewed: airway patency,  cardiovascular function, hydration status, mental status, nausea/vomiting, pain level and respiratory function     Pre-sedation assessment completed:  01/10/2023 1:30 PM Immediate pre-procedure details:    Reassessment: Patient reassessed immediately prior to procedure     Reviewed: vital signs, relevant labs/tests and NPO status     Verified: bag valve mask available, emergency equipment available, intubation equipment available, IV patency confirmed and oxygen available   Procedure details (see MAR for exact dosages):  Preoxygenation:  Nasal cannula   Sedation:  Etomidate   Intended level of sedation: deep   Analgesia:  None   Intra-procedure monitoring:  Blood pressure monitoring, cardiac monitor, continuous capnometry, continuous pulse oximetry, frequent LOC assessments and frequent vital sign checks   Intra-procedure events: airway compromise     Intra-procedure management:  Airway repositioning   Total Provider sedation time (minutes):  12 Post-procedure details:    Post-sedation assessment completed:  01/10/2023 1:53 PM   Attendance: Constant attendance by certified staff until patient recovered     Recovery: Patient returned to pre-procedure baseline     Post-sedation assessments completed and reviewed: airway patency, cardiovascular function, hydration status, mental status, nausea/vomiting, pain level and respiratory function     Patient is stable for discharge or admission: yes     Procedure completion:  Tolerated well, no immediate complications .Cardioversion  Date/Time: 01/10/2023 1:53 PM  Performed by: Gloris Manchester, MD Authorized by: Gloris Manchester, MD   Consent:    Consent obtained:  Verbal   Consent given by:  Patient   Risks discussed:  Induced arrhythmia, death and pain   Alternatives discussed:  Rate-control medication Pre-procedure details:    Cardioversion basis:  Elective   Rhythm:  Atrial flutter   Electrode placement:  Anterior-posterior Patient sedated: Yes.  Refer to sedation procedure documentation for details of sedation.  Attempt one:    Cardioversion mode:  Synchronous   Shock (Joules):  200   Shock outcome:  Conversion to normal sinus rhythm Post-procedure details:    Patient status:  Awake   Patient tolerance of procedure:  Tolerated well, no immediate complications     Medications Ordered in ED Medications  etomidate (AMIDATE) injection 7.18 mg (7.18 mg Intravenous Not Given 01/10/23 1342)  lactated ringers infusion ( Intravenous New Bag/Given 01/10/23 1238)  ondansetron (ZOFRAN) injection 4 mg (4 mg Intravenous Given 01/10/23 1238)  etomidate (AMIDATE) injection (7.18 mg Intravenous Given 01/10/23 1342)    ED Course/ Medical Decision Making/ A&P                                 Medical Decision Making Amount and/or Complexity of Data Reviewed Labs: ordered. Radiology: ordered.  Risk Prescription drug management.   This patient presents to the ED for concern of tachycardia, this involves an extensive number of treatment options, and is a complaint that carries with it a high risk of complications and morbidity.  The differential diagnosis includes atrial fibrillation, atrial flutter, sinus tachycardia, other arrhythmia, medication adherence, electrolyte abnormalities   Co morbidities that complicate the patient evaluation  seizures, HTN, nephrolithiasis, prostate cancer, HLD, DVT, CKD, atrial flutter   Additional history obtained:  Additional history obtained from N/A External records from outside source obtained and reviewed including EMR   Lab Tests:  I Ordered, and personally interpreted labs.  The pertinent results include: Normal hemoglobin, no leukocytosis, normal electrolytes   Imaging Studies ordered:  I ordered imaging studies including chest x-ray, CT chest I independently visualized and interpreted imaging which showed concern of nodule opacity on x-ray, CT chest shows no evidence of this nodule. I  agree with the radiologist interpretation   Cardiac Monitoring: / EKG:  The patient was maintained on a cardiac monitor.  I personally viewed and interpreted the cardiac monitored which showed an underlying rhythm of: Atrial flutter, converted to sinus rhythm   Consultations Obtained:  I requested consultation with the cardiologist, Dr.  Mallipeddi,  and discussed lab and imaging findings as well as pertinent plan - they recommend: ED cardioversion, restart Eliquis, outpatient follow-up if successful   Problem List / ED Course / Critical interventions / Medication management  Patient presents for tachycardia.  He has a history of atrial flutter and has undergone DCCV in the past.  Last time was in November.  He is subsequently been started on amiodarone, which she has been taking as prescribed, as well as had atrial appendage clip placed and taken off of Eliquis.  He checks his heart rate every morning.  It has been normal up until today.  He has been asymptomatic throughout the morning.  EKG in the ED is consistent with atrial flutter.  I suspect that onset was sometime in the last 24 hours.  I discussed this with cardiologist on-call, Dr. Jenene Slicker, who recommends bedside cardioversion in the ED.  Following cardioversion, patient to be restarted on Eliquis for the next 4 weeks and follow-up with cardiology, if all is successful.  I spoke with patient about risks and benefits and he does agree with attempted cardioversion.  Per chart review, he has done well with etomidate in the past.  Zofran and etomidate were ordered.  Patient underwent sedation and bedside cardioversion.  This did convert into a sinus rhythm with heart rate in the 50s.  He remained normotensive.  X-ray today showed a nodular opacity.  Noncontrasted CT scan was ordered to further evaluate.  CT, fortunately, did not show any evidence of the suspicious nodule.  Patient remained in normal sinus rhythm.  Per cardiology recommendation,  he was prescribed Eliquis, to resume taking.  Cardiology referral was ordered.  Hopefully he can get close follow-up.  He was discharged in stable condition. I ordered medication including etomidate for procedural sedation; Zofran for nausea prophylaxis Reevaluation of the patient after these medicines showed that the patient improved I have reviewed the patients home medicines and have made adjustments as needed   Social Determinants of Health:  Has access to outpatient care  CRITICAL CARE Performed by: Gloris Manchester   Total critical care time: 32 minutes  Critical care time was exclusive of separately billable procedures and treating other patients.  Critical care was necessary to treat or prevent imminent or life-threatening deterioration.  Critical care was time spent personally by me on the following activities: development of treatment plan with patient and/or surrogate as well as nursing, discussions with consultants, evaluation of patient's response to treatment, examination of patient, obtaining history from patient or surrogate, ordering and performing treatments and interventions, ordering and review of laboratory studies, ordering and review of radiographic studies, pulse oximetry and re-evaluation of patient's condition.         Final Clinical Impression(s) / ED Diagnoses Final diagnoses:  Atrial flutter with rapid ventricular response (HCC)    Rx / DC Orders ED Discharge Orders          Ordered    apixaban (ELIQUIS) 5 MG TABS tablet  2 times daily        01/10/23 1510    Ambulatory referral to Cardiology       Comments: If you have not heard from the Cardiology office within the next 72 hours please call [phone].   01/10/23 1513              Gloris Manchester, MD 01/10/23 1515

## 2023-01-10 NOTE — Discharge Instructions (Addendum)
A prescription for Eliquis was sent to your pharmacy.  Cardiology does want to start back on this.  Follow-up with them in the office to discuss further.  If you do not hear from the cardiology office in the next couple days, call the number below to set up that follow-up appointment.  Return to the emergency department for any new or worsening symptoms of concern.

## 2023-01-14 ENCOUNTER — Ambulatory Visit: Payer: Medicare Other | Attending: Student | Admitting: Student

## 2023-01-14 ENCOUNTER — Encounter: Payer: Self-pay | Admitting: Student

## 2023-01-14 VITALS — BP 130/64 | HR 54 | Ht 67.0 in | Wt 157.0 lb

## 2023-01-14 DIAGNOSIS — I4819 Other persistent atrial fibrillation: Secondary | ICD-10-CM | POA: Diagnosis not present

## 2023-01-14 DIAGNOSIS — L570 Actinic keratosis: Secondary | ICD-10-CM | POA: Diagnosis not present

## 2023-01-14 DIAGNOSIS — Z87898 Personal history of other specified conditions: Secondary | ICD-10-CM

## 2023-01-14 DIAGNOSIS — X32XXXD Exposure to sunlight, subsequent encounter: Secondary | ICD-10-CM | POA: Diagnosis not present

## 2023-01-14 DIAGNOSIS — I10 Essential (primary) hypertension: Secondary | ICD-10-CM | POA: Diagnosis not present

## 2023-01-14 DIAGNOSIS — E782 Mixed hyperlipidemia: Secondary | ICD-10-CM

## 2023-01-14 DIAGNOSIS — B078 Other viral warts: Secondary | ICD-10-CM | POA: Diagnosis not present

## 2023-01-14 DIAGNOSIS — I350 Nonrheumatic aortic (valve) stenosis: Secondary | ICD-10-CM | POA: Diagnosis not present

## 2023-01-14 DIAGNOSIS — Z1283 Encounter for screening for malignant neoplasm of skin: Secondary | ICD-10-CM | POA: Diagnosis not present

## 2023-01-14 DIAGNOSIS — D225 Melanocytic nevi of trunk: Secondary | ICD-10-CM | POA: Diagnosis not present

## 2023-01-14 DIAGNOSIS — L120 Bullous pemphigoid: Secondary | ICD-10-CM | POA: Diagnosis not present

## 2023-01-14 NOTE — Progress Notes (Signed)
Cardiology Office Note    Date:  01/14/2023  ID:  Kyle Hook., DOB May 30, 1934, MRN [id number] Cardiologist: Dina Rich, MD   EP: Dr. Ladona Ridgel  History of Present Illness:    Kyle [name]. is a 88 y.o. male with past medical history of persistent atrial flutter (s/p DCCV in 10/2020 and 11/2021, prior thoracoscopic closure of LAA in 04/2022), history of chest pain (normal NST in 11/2020), HTN, HLD and aortic stenosis who presents to the office today for follow-up from a recent Emergency Department visit.  He was recently examined by Dr. Wyline Mood in 12/2022 for follow-up of his aortic stenosis and this was severe by most recent echocardiogram but he was asymptomatic at that time. It was recommended to obtain follow-up imaging in 04/2023. He did have a severe dye allergy to contrast and previously underwent premedications for this and still developed a severe rash. It was felt this would likely be challenging to manage if he ended up needing to undergo workup for TAVR in the future.  In the interim, he did present to [facility] ED on 01/10/2023 for evaluation of tachycardia as his heart rate had been in the 130's when checked at home. He denied any associated chest pain or palpitations. He was found to be back in atrial flutter with RVR and underwent DCCV while in the ED. He had not been on anticoagulation but given DCCV, it was recommended to restart Eliquis for the next 4 weeks and then determine longer planning from there.  In talking with the patient today, he reports he was overall asymptomatic with his recent arrhythmia.  He only knew he went into an arrhythmia as he was checking his vitals routinely and noted his heart rate was in the 130's. He denies any recent chest pain or dyspnea on exertion. No recent orthopnea, PND or pitting edema. He remains active around his home and enjoys doing yard work.    Studies Reviewed:   EKG: EKG is ordered today and demonstrates:   EKG  Interpretation Date/Time:  Thursday January 14 2023 15:36:02 EDT Ventricular Rate:  54 PR Interval:  188 QRS Duration:  90 QT Interval:  510 QTC Calculation: 483 R Axis:   23  Text Interpretation: Sinus bradycardia Left ventricular hypertrophy with repolarization abnormality ( R in aVL ) Corrected QTc at 417 ms Confirmed by Randall An ([id number]) on 01/14/2023 5:09:10 PM       Echocardiogram: 10/2022 IMPRESSIONS     1. Left ventricular ejection fraction, by estimation, is 60 to 65%. The  left ventricle has normal function. The left ventricle has no regional  wall motion abnormalities. There is moderate concentric left ventricular  hypertrophy. Left ventricular  diastolic parameters are indeterminate.   2. Right ventricular systolic function is normal. The right ventricular  size is normal. There is moderately elevated pulmonary artery systolic  pressure. The estimated right ventricular systolic pressure is 50.1 mmHg.   3. Left atrial size was severely dilated.   4. The mitral valve is grossly normal. Mild mitral valve regurgitation.  No evidence of mitral stenosis.   5. The tricuspid valve is abnormal. Tricuspid valve regurgitation is mild  to moderate.   6. The aortic valve is tricuspid. There is severe calcifcation of the  aortic valve. Aortic valve regurgitation is mild. Severe aortic valve  stenosis. Aortic valve area, by VTI measures 0.59 cm. Aortic valve mean  gradient measures 32.0 mmHg. Aortic  valve Vmax measures 3.70 m/s. DVI is 0.20.  7. Aortic dilatation noted. There is borderline dilatation of the  ascending aorta, measuring 36 mm. Normal aortic root.   8. The inferior vena cava is normal in size with greater than 50%  respiratory variability, suggesting right atrial pressure of 3 mmHg.   Comparison(s): Changes from prior study are noted. AS worsened from  moderate to severe. DVI is 0.20.    Risk Assessment/Calculations:    CHA2DS2-VASc Score = 4    This indicates a 4.8% annual risk of stroke. The patient's score is based upon: CHF History: 0 HTN History: 1 Diabetes History: 0 Stroke History: 0 Vascular Disease History: 1 Age Score: 2 Gender Score: 0     Physical Exam:   VS:  BP 130/64   Pulse (!) 54   Ht 5\' 7"  (1.702 m)   Wt 157 lb (71.2 kg)   SpO2 94%   BMI 24.59 kg/m    Wt Readings from Last 3 Encounters:  01/14/23 157 lb (71.2 kg)  12/28/22 158 lb (71.7 kg)  12/25/22 156 lb (70.8 kg)     GEN: Pleasant, elderly male appearing in no acute distress NECK: No JVD; No carotid bruits CARDIAC: RRR, 3/6 SEM along RUSB RESPIRATORY:  Clear to auscultation without rales, wheezing or rhonchi  ABDOMEN: Appears non-distended. No obvious abdominal masses. EXTREMITIES: No clubbing or cyanosis. Trace ankle edema bilaterally.  Distal pedal pulses are 2+ bilaterally.   Assessment and Plan:   1. Persistent Atrial Flutter - He has undergone prior cardioversions and was found to have recurrent atrial flutter earlier this week and underwent successful DCCV while in the ED. He is maintaining normal sinus rhythm at this time. At the instruction of EP, he has been taking Amiodarone 200 mg on Saturday and Sunday and 100 mg on the weekdays. This was reduced in the past given his prior thyroid issues. At this time, will continue current dosing. - He was previously not felt to be a candidate for Watchman device placement due to his severe contrast allergy and did undergo left atrial appendage occlusion/clipping in 04/2022. He was started on Eliquis 5 mg twice daily following recent DCCV for 1 month and I did confirm with Dr. Wyline Mood that this could be stopped after 4 weeks. He did recommend reviewing indications for this with Dr. Lalla Brothers and will send a message to him today in regards to this.  2. History of chest pain - He had a normal nuclear stress test in 11/2020 and denies any recent anginal symptoms. Continue with risk factor  modification.  3. Aortic stenosis - This was severe by most recent echocardiogram with mean gradient at 32.0 mmHg. He is scheduled for a follow-up echocardiogram in 04/2023. As discussed in prior notes, it is unclear what will be the best way to proceed with TAVR workup given his severe contrast allergy as he reports having a severe skin reaction in the past despite receiving premedications.  4. HTN - BP is well-controlled at 130/64 during today's visit. Continue current medical therapy with Losartan 50 mg daily.  5.  HLD - LDL was 70 in 07/2022. He is currently on Simvastatin 10 mg daily and if he required further titration in the future, would switch to a different statin given the interaction with Amiodarone.   Signed, Ellsworth Lennox, PA-C

## 2023-01-14 NOTE — Patient Instructions (Addendum)
Medication Instructions:  Your physician recommends that you continue on your current medications as directed. Please refer to the Current Medication list given to you today.  Stop Eliquis when current supply is complete.   *If you need a refill on your cardiac medications before your next appointment, please call your pharmacy*   Lab Work: NONE   If you have labs (blood work) drawn today and your tests are completely normal, you will receive your results only by: MyChart Message (if you have MyChart) OR A paper copy in the mail If you have any lab test that is abnormal or we need to change your treatment, we will call you to review the results.   Testing/Procedures: Your physician has requested that you have an echocardiogram in January. Echocardiography is a painless test that uses sound waves to create images of your heart. It provides your doctor with information about the size and shape of your heart and how well your heart's chambers and valves are working. This procedure takes approximately one hour. There are no restrictions for this procedure. Please do NOT wear cologne, perfume, aftershave, or lotions (deodorant is allowed). Please arrive 15 minutes prior to your appointment time.    Follow-Up: At [facility], you and your health needs are our priority.  As part of our continuing mission to provide you with exceptional heart care, we have created designated Provider Care Teams.  These Care Teams include your primary Cardiologist (physician) and Advanced Practice Providers (APPs -  Physician Assistants and Nurse Practitioners) who all work together to provide you with the care you need, when you need it.  We recommend signing up for the patient portal called "MyChart".  Sign up information is provided on this After Visit Summary.  MyChart is used to connect with patients for Virtual Visits (Telemedicine).  Patients are able to view lab/test results, encounter notes,  upcoming appointments, etc.  Non-urgent messages can be sent to your provider as well.   To learn more about what you can do with MyChart, go to [email or web address].    Your next appointment:    January   Provider:   You may see Dina Rich, MD or one of the following Advanced Practice Providers on your designated Care Team:   Randall An, PA-C  Jacolyn Reedy, [place]     Other Instructions Thank you for choosing [facility]!

## 2023-02-09 DIAGNOSIS — E042 Nontoxic multinodular goiter: Secondary | ICD-10-CM | POA: Diagnosis not present

## 2023-02-10 LAB — T4, FREE: Free T4: 1.63 ng/dL (ref 0.82–1.77)

## 2023-02-10 LAB — T3, FREE: T3, Free: 2.7 pg/mL (ref 2.0–4.4)

## 2023-02-10 LAB — TSH: TSH: 0.716 u[IU]/mL (ref 0.450–4.500)

## 2023-02-16 ENCOUNTER — Ambulatory Visit: Payer: Medicare Other | Admitting: "Endocrinology

## 2023-02-16 ENCOUNTER — Encounter: Payer: Self-pay | Admitting: "Endocrinology

## 2023-02-16 VITALS — BP 140/66 | HR 68 | Ht 67.0 in | Wt 154.6 lb

## 2023-02-16 DIAGNOSIS — E042 Nontoxic multinodular goiter: Secondary | ICD-10-CM
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
## 2023-04-01 DIAGNOSIS — M47896 Other spondylosis, lumbar region: Secondary | ICD-10-CM | POA: Diagnosis not present

## 2023-04-01 DIAGNOSIS — M51362 Other intervertebral disc degeneration, lumbar region with discogenic back pain and lower extremity pain: Secondary | ICD-10-CM | POA: Diagnosis not present

## 2023-04-19 ENCOUNTER — Ambulatory Visit: Payer: Medicare Other | Attending: Cardiology

## 2023-04-19 ENCOUNTER — Other Ambulatory Visit: Payer: Self-pay | Admitting: Cardiology

## 2023-04-19 DIAGNOSIS — I350 Nonrheumatic aortic (valve) stenosis: Secondary | ICD-10-CM | POA: Diagnosis not present

## 2023-04-19 LAB — ECHOCARDIOGRAM COMPLETE
AR max vel: 0.69 cm2
AV Area VTI: 0.74 cm2
AV Area mean vel: 0.69 cm2
AV Mean grad: 32.5 mm[Hg]
AV Peak grad: 54.6 mm[Hg]
Ao pk vel: 3.7 m/s
Area-P 1/2: 1.76 cm2
Calc EF: 57 %
MV VTI: 1.68 cm2
S' Lateral: 2.5 cm
Single Plane A2C EF: 50.9 %
Single Plane A4C EF: 63.2 %

## 2023-04-22 ENCOUNTER — Encounter: Payer: Self-pay | Admitting: General Surgery

## 2023-04-22 ENCOUNTER — Ambulatory Visit: Payer: Medicare Other | Admitting: General Surgery

## 2023-04-22 VITALS — BP 145/79 | HR 56 | Temp 97.8°F | Resp 14 | Ht 67.0 in | Wt 155.0 lb

## 2023-04-22 DIAGNOSIS — K4090 Unilateral inguinal hernia, without obstruction or gangrene, not specified as recurrent: Secondary | ICD-10-CM

## 2023-04-22 NOTE — Progress Notes (Signed)
 Subjective:     Kyle [name].  Patient is here for follow-up of his left inguinal hernia.  He states it has gotten larger since I last saw him.  He has not not had any episodes of nausea, vomiting, or obstructive symptoms.  Minimal discomfort is noted in the left groin and scrotal regions.  His bowel movements have been regular. He is currently being worked up by cardiology for multiple issues including severe aortic stenosis.  He is seeing his cardiologist later this month. Objective:    BP (!) 145/79   Pulse (!) 56   Temp 97.8 F (36.6 C) (Oral)   Resp 14   Ht 5' 7 (1.702 m)   Wt 155 lb (70.3 kg)   SpO2 96%   BMI 24.28 kg/m   General:  alert, cooperative, and no distress  Head is normocephalic, atraumatic Lungs are clear to auscultation with equal breath sounds bilaterally Heart examination reveals a regular rate and rhythm with a 3 out of 6 pansystolic murmur Abdomen is soft, nontender, nondistended.  A large left inguinal hernia containing bowel is present in the scrotum.  Difficult to assess the testicles given the size.    Office notes reviewed, cardiology notes reviewed Assessment:    Large left inguinal hernia without strangulation or obstruction   Severe aortic stenosis Plan:   I told the patient that he is not an operative candidate at the present time given his aortic stenosis.  This has to be addressed first prior to any consideration of hernia repair.  In addition, he may need his hernia fixed at a tertiary care center in the future depending on his cardiac condition.  He has tried a truss in the past but it has not been helpful.  I stressed to him the need for following up with cardiology.  He understands.  Follow-up here as needed.

## 2023-05-04 ENCOUNTER — Ambulatory Visit: Payer: Medicare Other | Attending: Cardiology | Admitting: Cardiology

## 2023-05-04 ENCOUNTER — Encounter: Payer: Self-pay | Admitting: *Deleted

## 2023-05-04 ENCOUNTER — Encounter: Payer: Self-pay | Admitting: Cardiology

## 2023-05-04 VITALS — BP 136/68 | HR 76 | Ht 67.0 in | Wt 156.4 lb

## 2023-05-04 DIAGNOSIS — I4819 Other persistent atrial fibrillation: Secondary | ICD-10-CM

## 2023-05-04 DIAGNOSIS — Z79899 Other long term (current) drug therapy: Secondary | ICD-10-CM | POA: Diagnosis not present

## 2023-05-04 DIAGNOSIS — I350 Nonrheumatic aortic (valve) stenosis: Secondary | ICD-10-CM | POA: Diagnosis not present

## 2023-05-04 DIAGNOSIS — I10 Essential (primary) hypertension: Secondary | ICD-10-CM | POA: Diagnosis not present

## 2023-05-04 NOTE — Patient Instructions (Addendum)
Medication Instructions:   Continue all current medications.   Labwork:  CMET - order given today   Testing/Procedures:  Your physician has requested that you have an exercise tolerance test. For further information please visit [email or web address] Please also follow instruction sheet, as given.   Follow-Up:  Office will contact with results via phone, letter or mychart.    3 months   Any Other Special Instructions Will Be Listed Below (If Applicable).   If you need a refill on your cardiac medications before your next appointment, please call your pharmacy.

## 2023-05-04 NOTE — Progress Notes (Signed)
Clinical Summary Kyle [name] is a 89 y.o.male seen today for follow up of the following medical problems.     1. Aortic stenosis - 09/2020 echo LVEF 65-70%, AV mean grad 10 DI 0.44 AVA VTI 1.39 SVI 32  10/2021 echo LVEF 70-75%, grade II dd, mod AS mean grad 24 DI 0.36 AVA VTI 1.12 - denies any SOB/DOE, no chest pain   10/2022 echo: LVEF 60-65%, indet diastolic fxn, severe AS mean grad 32, AVA VTI 0.59, DI 0.20, SVI 12 May 2023 LVEF 60-65%, grade II dd, mild MR, moderate TR, AV mean 33, DI 0.24, 0.74, SVI - no SOB/DOE, no chest pains.  - uses a treadmill at home, last used few weeks ago.     2.Atrial flutter - 11/07/20 DCCV. HRs low after conversion, diltiazem was stopped and continued on toprol 25mg . Toprol lowered to 12.5mg  daily after ongoing bradycardia and then later stopped   -recurrent aflutter at 11/13/21 appt,  - 11/20/21 succesful DCCV to SR. Low HRs after conversion, toprol was stopped - 12/24/21 nursing visit EKG back in aflutter rate 93 -12/26/21 ER visit DCCV again to SR   -DCCV in ER 03/01/22 - amio started 03/03/22 - has maintained SR since then, no recent symptoms    - evaluated for watchman, not able to proceed due to severe contrast allergy  -Dr Lalla Brothers had suggested if patient interested referring him to Dr. Delia Chimes to consider thoracoscopic, off-pump left atrial appendage occlusion/clipping.  Patient does have interest, debilitating arthritis unable to take NSAIDs on eliquis.   -s/p thorascopic closure left atrial appendage by Dr Delia Chimes 04/20/22. Has done well        06/2022 monitor: 14 day monitor, rare ectopy, 12 runs SVT longest 9 beats, no symptoms reported  - no recent symptoms.  - avg heart rates at home 60s. Home bp cuff does report some irregular beats at times 07/2022 normal TFTs and LFTs    - 01/10/2023 seen in ER with recurrent aflutter, DCCV back to SR in ED.  - EKG today shows NSR           2,. HTN - he is compliant with meds - recently  started norvasc 2.5mg  daily.  - home bp's 130s-150s/70s - recent leg swelling since starting norvasc. He says he thinks years ago was tried on a medication for bp that also causes swelling but not sure of the name.  - off chlortahlidone, admit 05/2022 with dehydration and AKI    - home bp's 130s/60s. Sometimes higher, he will take additional losartan.     3. Hyperlipidemia - on simvastatin - 07/2022 TC 173 TG 84 HDL 88 LDL 70   4. Preoperative evaluation - considering hernia surgery   Past Medical History:  Diagnosis Date   Arthritis    Atrial fibrillation (HCC)    Auto immune neutropenia (HCC)    Bullous pemphigoid    Cancer (HCC)    Prostate   Cervical spondylosis without myelopathy 01/22/2016   Chronic kidney disease    Per MD note 05/2021 CKD stage non specified   Complication of anesthesia    difficult to wake up after anesthesia   DVT (deep venous thrombosis) (HCC)    a. Has had a previous right lower extremity DVT in the setting of hospitalization and surgery (after his brain tumor was resected in 1993) but is no longer on Coumadin.   Dyslipidemia    Dysrhythmia    History of kidney stones  History of prostate cancer    Hypertension    Meningioma (HCC)    a. s/p resection 1990s.   Myasthenia (HCC) 02/14/2013   Nocturnal leg cramps    Ocular myasthenia gravis (HCC)    Peripheral neuropathy 11/23/2018   Seizures (HCC)    Sinus bradycardia    Sleep paralysis    Thyroid nodule, cold      Allergies  Allergen Reactions   Iodinated Contrast Media Hives     pt was premedicated/hives reaction occurred 24 hours post injection, Onset Date: 16109604    Sulfa Antibiotics Hives, Swelling and Rash   Bactericin [Bacitracin] Other (See Comments)    Unknown reaction    Norvasc [Amlodipine] Swelling    Leg swelling     Current Outpatient Medications  Medication Sig Dispense Refill   amiodarone (PACERONE) 200 MG tablet Take 200 mg Daily on Saturday, Sunday Take 100  mg Daily Monday -Friday 90 tablet 3   cholecalciferol (VITAMIN D3) 25 MCG (1000 UNIT) tablet Take 1,000 Units by mouth daily.     clobetasol (TEMOVATE) 0.05 % external solution Apply 1 Application topically 2 (two) times daily as needed (blisters).     clobetasol cream (TEMOVATE) 0.05 % Apply 1 application  topically 2 (two) times daily as needed (blisters).     fish oil-omega-3 fatty acids 1000 MG capsule Take 1 g by mouth 4 (four) times daily.     Flaxseed, Linseed, (FLAXSEED OIL PO) Take 1 capsule by mouth.     gabapentin (NEURONTIN) 600 MG tablet Take 1 tablet (600 mg total) by mouth daily. 90 tablet 3   Garlic (GARLIQUE PO) Take 1 tablet by mouth daily.     HYDROcodone-acetaminophen (NORCO/VICODIN) 5-325 MG tablet Take 1 tablet by mouth 2 (two) times daily.     levETIRAcetam (KEPPRA) 500 MG tablet Take 1/2 tablet in the morning, take 1 tablet in the evening 135 tablet 3   losartan (COZAAR) 50 MG tablet Take 1 tablet (50 mg total) by mouth daily. 90 tablet 3   niacinamide 500 MG tablet Take 500 mg by mouth 3 (three) times daily.     Polyethyl Glycol-Propyl Glycol 0.4-0.3 % SOLN Place 1 drop into both eyes 3 (three) times daily.     pyridostigmine (MESTINON) 60 MG tablet Take 1 tablet (60 mg total) by mouth 3 (three) times daily as needed. 270 tablet 3   simvastatin (ZOCOR) 20 MG tablet Take 10 mg by mouth at bedtime.     tiZANidine (ZANAFLEX) 4 MG tablet Take 4 mg by mouth at bedtime.     No current facility-administered medications for this visit.     Past Surgical History:  Procedure Laterality Date   BLADDER SURGERY     removed part of the bladder   BRAIN SURGERY     CARDIOVERSION N/A 11/07/2020   Procedure: CARDIOVERSION;  Surgeon: Antoine Poche, MD;  Location: [facility];  Service: Endoscopy;  Laterality: N/A;   CARDIOVERSION N/A 11/20/2021   Procedure: CARDIOVERSION;  Surgeon: Antoine Poche, MD;  Location: [facility];  Service: Endoscopy;  Laterality: N/A;   CHOLECYSTECTOMY      CLIPPING OF ATRIAL APPENDAGE N/A 04/20/2022   Procedure: CLIPPING OF ATRIAL APPENDAGE USING 45 ATRIAL CLIP;  Surgeon: Lyn Hollingshead, MD;  Location: [facility];  Service: Open Heart Surgery;  Laterality: N/A;   COLONOSCOPY N/A 07/25/2013   Procedure: COLONOSCOPY;  Surgeon: Dalia Heading, MD;  Location: [facility];  Service: Gastroenterology;  Laterality: N/A;  COLONOSCOPY N/A 11/10/2016   Procedure: COLONOSCOPY;  Surgeon: Franky Macho, MD;  Location: [facility];  Service: Gastroenterology;  Laterality: N/A;   IR CATHETER TUBE CHANGE  01/02/2017   IR RADIOLOGIST EVAL & MGMT  01/06/2017   IR RADIOLOGIST EVAL & MGMT  01/20/2017   IR RADIOLOGIST EVAL & MGMT  02/03/2017   IR RADIOLOGIST EVAL & MGMT  02/17/2017   IR SINUS/FIST TUBE CHK-NON GI  03/03/2017   PARTIAL COLECTOMY     RADIOACTIVE SEED IMPLANT     TEE WITHOUT CARDIOVERSION N/A 04/20/2022   Procedure: TRANSESOPHAGEAL ECHOCARDIOGRAM (TEE);  Surgeon: Lyn Hollingshead, MD;  Location[facility];  Service: Open Heart Surgery;  Laterality: N/A;   VIDEO ASSISTED THORACOSCOPY Left 04/20/2022   Procedure: VIDEO ASSISTED THORACOSCOPY;  Surgeon: Lyn Hollingshead, MD;  Location: [facility] OR;  Service: Thoracic;  Laterality: Left;     Allergies  Allergen Reactions   Iodinated Contrast Media Hives     pt was premedicated/hives reaction occurred 24 hours post injection, Onset Date: 16109604    Sulfa Antibiotics Hives, Swelling and Rash   Bactericin [Bacitracin] Other (See Comments)    Unknown reaction    Norvasc [Amlodipine] Swelling    Leg swelling      Family History  Problem Relation Age of Onset   Heart failure Mother    Diabetes Mother    Diabetes Sister    Dementia Sister    Lung cancer Son    Heart failure Daughter    Colon cancer Neg Hx      Social History Kyle [name] reports that he has never smoked. He has never been exposed to tobacco smoke. He has never used smokeless tobacco. Kyle [name] reports no history of alcohol  use.   Review of Systems CONSTITUTIONAL: No weight loss, fever, chills, weakness or fatigue.  HEENT: Eyes: No visual loss, blurred vision, double vision or yellow sclerae.No hearing loss, sneezing, congestion, runny nose or sore throat.  SKIN: No rash or itching.  CARDIOVASCULAR: per hpi RESPIRATORY: per hpi GASTROINTESTINAL: No anorexia, nausea, vomiting or diarrhea. No abdominal pain or blood.  GENITOURINARY: No burning on urination, no polyuria NEUROLOGICAL: No headache, dizziness, syncope, paralysis, ataxia, numbness or tingling in the extremities. No change in bowel or bladder control.  MUSCULOSKELETAL: No muscle, back pain, joint pain or stiffness.  LYMPHATICS: No enlarged nodes. No history of splenectomy.  PSYCHIATRIC: No history of depression or anxiety.  ENDOCRINOLOGIC: No reports of sweating, cold or heat intolerance. No polyuria or polydipsia.  Marland Kitchen   Physical Examination Today's Vitals   05/04/23 1444  BP: 136/68  Pulse: 76  SpO2: 99%  Weight: 156 lb 6.4 oz (70.9 kg)  Height: 5\' 7"  (1.702 m)   Body mass index is 24.5 kg/m.  Gen: resting comfortably, no acute distress HEENT: no scleral icterus, pupils equal round and reactive, no palptable cervical adenopathy,  CV: RRR, 3/6 systolic murmur rusb Resp: Clear to auscultation bilaterally GI: abdomen is soft, non-tender, non-distended, normal bowel sounds, no hepatosplenomegaly MSK: extremities are warm, no edema.  Skin: warm, no rash Neuro:  no focal deficits Psych: appropriate affect   Diagnostic Studies  10/2022 echo 1. Left ventricular ejection fraction, by estimation, is 60 to 65%. The  left ventricle has normal function. The left ventricle has no regional  wall motion abnormalities. There is moderate concentric left ventricular  hypertrophy. Left ventricular  diastolic parameters are indeterminate.   2. Right ventricular systolic function is normal. The right ventricular  size  is normal. There is moderately  elevated pulmonary artery systolic  pressure. The estimated right ventricular systolic pressure is 50.1 mmHg.   3. Left atrial size was severely dilated.   4. The mitral valve is grossly normal. Mild mitral valve regurgitation.  No evidence of mitral stenosis.   5. The tricuspid valve is abnormal. Tricuspid valve regurgitation is mild  to moderate.   6. The aortic valve is tricuspid. There is severe calcifcation of the  aortic valve. Aortic valve regurgitation is mild. Severe aortic valve  stenosis. Aortic valve area, by VTI measures 0.59 cm. Aortic valve mean  gradient measures 32.0 mmHg. Aortic  valve Vmax measures 3.70 m/s. DVI is 0.20.   7. Aortic dilatation noted. There is borderline dilatation of the  ascending aorta, measuring 36 mm. Normal aortic root.   8. The inferior vena cava is normal in size with greater than 50%  respiratory variability, suggesting right atrial pressure of 3 mmHg.   Jan 2025 echo 1. Left ventricular ejection fraction, by estimation, is 60 to 65%. The  left ventricle has normal function. The left ventricle has no regional  wall motion abnormalities. There is moderate concentric left ventricular  hypertrophy. Left ventricular  diastolic parameters are consistent with Grade II diastolic dysfunction  (pseudonormalization).   2. Right ventricular systolic function is normal. The right ventricular  size is normal. There is mildly elevated pulmonary artery systolic  pressure. The estimated right ventricular systolic pressure is 44.2 mmHg.   3. Left atrial size was severely dilated.   4. The mitral valve is degenerative. Mild mitral valve regurgitation.   5. Tricuspid valve regurgitation is moderate.   6. The aortic valve is tricuspid. There is severe calcifcation of the  aortic valve. Aortic valve regurgitation is trivial. Severe aortic valve  stenosis. Aortic valve mean gradient measures 32.5 mmHg. Dimentionless  index 0.24.   7. The inferior vena cava is  normal in size with greater than 50%  respiratory variability, suggesting right atrial pressure of 3 mmHg.  Assessment and Plan   1.Aortic stenosis - severe AS, he has denied symptoms historically however I think we are a point where we need to consider a more objective measure of exercise capacity. This would also be imperative to know given he is considering hernia surgery.  - plan for GXT to further evaluate - sever dye allergy, would need to confer with interventional colleagues our options if he is at a point where we need to consider valve replacement, perhaps a more aggressive dye prep    2.Preoperative evaluation - progressively uncomfortable abdominal hernia - needs GXT to further risk stratify his aortic stenosis to better weigh surgical options     Antoine Poche, M.D.,

## 2023-05-07 ENCOUNTER — Other Ambulatory Visit: Payer: Self-pay | Admitting: Cardiology

## 2023-05-07 DIAGNOSIS — Z79899 Other long term (current) drug therapy: Secondary | ICD-10-CM | POA: Diagnosis not present

## 2023-05-07 DIAGNOSIS — I10 Essential (primary) hypertension: Secondary | ICD-10-CM | POA: Diagnosis not present

## 2023-05-10 ENCOUNTER — Encounter: Payer: Self-pay | Admitting: Physician Assistant

## 2023-05-10 ENCOUNTER — Ambulatory Visit (HOSPITAL_COMMUNITY)
Admission: RE | Admit: 2023-05-10 | Discharge: 2023-05-10 | Disposition: A | Discharge status: Home / Self Care | Payer: Medicare Other | Source: Ambulatory Visit | Attending: Cardiology | Admitting: Cardiology

## 2023-05-10 DIAGNOSIS — I350 Nonrheumatic aortic (valve) stenosis: Secondary | ICD-10-CM | POA: Diagnosis not present

## 2023-05-10 LAB — COMPREHENSIVE METABOLIC PANEL
ALT: 18 [IU]/L (ref 0–44)
AST: 24 [IU]/L (ref 0–40)
Albumin: 4.7 g/dL (ref 3.7–4.7)
Alkaline Phosphatase: 88 [IU]/L (ref 44–121)
BUN/Creatinine Ratio: 17 (ref 10–24)
BUN: 23 mg/dL (ref 8–27)
Bilirubin Total: 0.6 mg/dL (ref 0.0–1.2)
CO2: 18 mmol/L — ABNORMAL LOW (ref 20–29)
Calcium: 10.2 mg/dL (ref 8.6–10.2)
Chloride: 101 mmol/L (ref 96–106)
Creatinine, Ser: 1.35 mg/dL — ABNORMAL HIGH (ref 0.76–1.27)
Globulin, Total: 2.7 g/dL (ref 1.5–4.5)
Glucose: 130 mg/dL — ABNORMAL HIGH (ref 70–99)
Potassium: 4.8 mmol/L (ref 3.5–5.2)
Sodium: 142 mmol/L (ref 134–144)
Total Protein: 7.4 g/dL (ref 6.0–8.5)
eGFR: 51 mL/min/{1.73_m2} — ABNORMAL LOW (ref >59)

## 2023-05-10 LAB — EXERCISE TOLERANCE TEST
Angina Index: 0
Estimated workload: 3.4
Exercise duration (min): 5 min
Exercise duration (sec): 50 s
MPHR: 134 {beats}/min
Peak HR: 123 {beats}/min
Percent HR: 91 %
RPE: 13
Rest HR: 49 {beats}/min

## 2023-05-10 LAB — SPECIMEN STATUS REPORT

## 2023-05-10 NOTE — Progress Notes (Signed)
Confirmed plan for ETT with Dr. Wyline Mood. Plan modified Bruce.

## 2023-05-27 ENCOUNTER — Telehealth: Payer: Self-pay | Admitting: *Deleted

## 2023-05-27 DIAGNOSIS — I350 Nonrheumatic aortic (valve) stenosis: Secondary | ICD-10-CM

## 2023-05-27 NOTE — Telephone Encounter (Signed)
Pt in office to get stress test results.   Exercise test does does some exercise limitations, can [facility]   Dominga Ferry MD  Pt c/o elevated BP. Pt states that he was told by Dr. Wyline Mood that he could take an extra BP pill ( Losartan) as needed. Pt reports that he has been taking 100 mg 4 out of 7 days a week. He repots blood pressure as follows.  141/72 Hr 60 140/78 Hr 76 143/78 HR 62 145/81 Hr 65 146/78 Hr 61  147/ 76 Hr 56 Pt denies chest pain, SOB, dizziness, and headaches. Please advise.

## 2023-05-28 MED ORDER — LOSARTAN POTASSIUM 100 MG PO TABS
100.0000 mg | ORAL_TABLET | Freq: Every day | ORAL | 3 refills | Status: DC
Start: 2023-05-28 — End: 2024-02-03

## 2023-05-28 NOTE — Addendum Note (Signed)
Addended by: Marlyn Corporal A on: 05/28/2023 02:41 PM   Modules accepted: Orders

## 2023-05-28 NOTE — Telephone Encounter (Signed)
BP's too high, please increase his daily losartan to 100mg  daily  Dominga Ferry MD

## 2023-05-28 NOTE — Telephone Encounter (Signed)
I spoke with patient and he agrees to increase his losartan to 100 mg daily and keep daily bp logs and return with readings in 2 weeks

## 2023-06-02 NOTE — H&P (View-Only) (Signed)
 Patient ID: Kyle [name]. MRN: [id number] DOB/AGE: 11/13/1934 89 y.o.  Primary Care Physician:Fusco, Lyman Bishop, MD Primary Cardiologist: Dominga Ferry  CC:  Aortic valvular disease management     FOCUSED PROBLEM LIST:   AS AVA 0.59, DI 0.2, SVI 29, MG 32, EF 60-65% TTE July 2024 AVA 0.74, DI 0.24, MG 33, EF 60-65% January 2025 EKG 1/25 SR without BBB Atrial flutter CV 7/22, 8/23, 11/23, 9/24 S/p thorascopic closure LAA 04/2022 Hypertension Hyperlipidemia Aortic atherosclerosis Chest CT 2024 CKD IIIa BMI 24 Frailty IV dye allergy  Discussed the use of AI scribe software for clinical note transcription with the patient, who gave verbal consent to proceed.  History of Present Illness        2/25:  The patient is a 89M with the above listed problems here for recommendations regarding his aortic stenosis.  He has been followed by Dr. Wyline Mood for some time.  His AS has been noted to be severe and he was referred for an ETT recently which demonstrated reduced exercise capacity and diffuse ST depressions.  He has a history of arrhythmia and has had 3 cardioversions in the past.. He underwent a procedure to close the appendage to discontinue blood thinners and is currently not on blood thinners. He also has a history of hypertension and is on medication for it. No diabetes, but he mentions mild chronic kidney disease.  He has a history of allergy to CT scan dye, causing a severe skin reaction resembling a sunburn, with itching and peeling. This reaction occurred several years ago and again in 2018 with a half dose of the dye. Premedication with three medications was attempted in the past but was ineffective.  He has a left-sided hernia that has been increasing in size over the past six months, causing discomfort, especially when walking. The hernia becomes softer when lying down but hardens when standing.  He has a history of two blood clots in his right leg, resulting in some  persistent swelling. No current significant swelling or problems with breathing while lying flat. He sleeps in a recliner due to leg discomfort and cramps, for which he has been prescribed medications.  He has a history of brain tumor surgery in 1993 and a colon perforation in 2018, which required a tube placement and subsequent monitoring. He also has a history of diverticulitis. No current issues with his teeth, although he had a bridge removed last year and was advised to have two teeth pulled, which are not currently bothering him.         Past Medical History:  Diagnosis Date   Arthritis    Atrial fibrillation (HCC)    Auto immune neutropenia (HCC)    Bullous pemphigoid    Cancer (HCC)    Prostate   Cervical spondylosis without myelopathy 01/22/2016   Chronic kidney disease    Per MD note 05/2021 CKD stage non specified   Complication of anesthesia    difficult to wake up after anesthesia   DVT (deep venous thrombosis) (HCC)    a. Has had a previous right lower extremity DVT in the setting of hospitalization and surgery (after his brain tumor was resected in 1993) but is no longer on Coumadin.   Dyslipidemia    Dysrhythmia    History of kidney stones    History of prostate cancer    Hypertension    Meningioma (HCC)    a. s/p resection 1990s.   Myasthenia (HCC) 02/14/2013   Nocturnal leg  cramps    Ocular myasthenia gravis (HCC)    Peripheral neuropathy 11/23/2018   Seizures (HCC)    Sinus bradycardia    Sleep paralysis    Thyroid nodule, cold     Past Surgical History:  Procedure Laterality Date   BLADDER SURGERY     removed part of the bladder   BRAIN SURGERY     CARDIOVERSION N/A 11/07/2020   Procedure: CARDIOVERSION;  Surgeon: Antoine Poche, MD;  Location: [facility];  Service: Endoscopy;  Laterality: N/A;   CARDIOVERSION N/A 11/20/2021   Procedure: CARDIOVERSION;  Surgeon: Antoine Poche, MD;  Location: [facility];  Service: Endoscopy;  Laterality: N/A;    CHOLECYSTECTOMY     CLIPPING OF ATRIAL APPENDAGE N/A 04/20/2022   Procedure: CLIPPING OF ATRIAL APPENDAGE USING 45 ATRIAL CLIP;  Surgeon: Lyn Hollingshead, MD;  Location: [facility];  Service: Open Heart Surgery;  Laterality: N/A;   COLONOSCOPY N/A 07/25/2013   Procedure: COLONOSCOPY;  Surgeon: Dalia Heading, MD;  Location: [facility];  Service: Gastroenterology;  Laterality: N/A;   COLONOSCOPY N/A 11/10/2016   Procedure: COLONOSCOPY;  Surgeon: Franky Macho, MD;  Location: [facility];  Service: Gastroenterology;  Laterality: N/A;   IR CATHETER TUBE CHANGE  01/02/2017   IR RADIOLOGIST EVAL & MGMT  01/06/2017   IR RADIOLOGIST EVAL & MGMT  01/20/2017   IR RADIOLOGIST EVAL & MGMT  02/03/2017   IR RADIOLOGIST EVAL & MGMT  02/17/2017   IR SINUS/FIST TUBE CHK-NON GI  03/03/2017   PARTIAL COLECTOMY     RADIOACTIVE SEED IMPLANT     TEE WITHOUT CARDIOVERSION N/A 04/20/2022   Procedure: TRANSESOPHAGEAL ECHOCARDIOGRAM (TEE);  Surgeon: Lyn Hollingshead, MD;  Location: [facility] [place] OR;  Service: Open Heart Surgery;  Laterality: N/A;   VIDEO ASSISTED THORACOSCOPY Left 04/20/2022   Procedure: VIDEO ASSISTED THORACOSCOPY;  Surgeon: Lyn Hollingshead, MD;  Location[facility] OR;  Service: Thoracic;  Laterality: Left;    Family History  Problem Relation Age of Onset   Heart failure Mother    Diabetes Mother    Diabetes Sister    Dementia Sister    Lung cancer Son    Heart failure Daughter    Colon cancer Neg Hx     Social History   Socioeconomic History   Marital status: Married    Spouse name: Diplomatic Services operational officer   Number of children: 1   Years of education: 12 TH   Highest education level: Not on file  Occupational History   Occupation: retired  Tobacco Use   Smoking status: Never    Passive exposure: Never   Smokeless tobacco: Never  Vaping Use   Vaping status: Never Used  Substance and Sexual Activity   Alcohol use: No   Drug use: No   Sexual activity: Not on file  Other Topics Concern   Not on file  Social History  Narrative   Patient is right handed.   Patient drinks 1 cup of coffee daily.   Social Drivers of Corporate investment banker Strain: Low Risk  (12/15/2022)   Overall Financial Resource Strain (CARDIA)    Difficulty of Paying Living Expenses: Not hard at all  Food Insecurity: Low Risk  (12/17/2022)   Received from [facility]   Hunger Vital Sign    Worried About Running Out of Food in the Last Year: Never true    Ran Out of Food in the Last Year: Never true  Transportation Needs: No Transportation Needs (12/17/2022)  Received from [facility]    In the past 12 months, has lack of reliable transportation kept you from medical appointments, meetings, work or from getting things needed for daily living? : No  Physical Activity: Sufficiently Active (11/09/2022)   Exercise Vital Sign    Days of Exercise per Week: 7 days    Minutes of Exercise per Session: 30 min  Stress: Not on file  Social Connections: Not on file  Intimate Partner Violence: Not At Risk (06/09/2022)   Humiliation, Afraid, Rape, and Kick questionnaire    Fear of Current or Ex-Partner: No    Emotionally Abused: No    Physically Abused: No    Sexually Abused: No     Prior to Admission medications   Medication Sig Start Date End Date Taking? Authorizing Provider  amiodarone (PACERONE) 200 MG tablet Take 200 mg Daily on Saturday, Sunday Take 100 mg Daily Monday -Friday 11/12/22   Marinus Maw, MD  cholecalciferol (VITAMIN D3) 25 MCG (1000 UNIT) tablet Take 1,000 Units by mouth daily.    [provider]  clobetasol (TEMOVATE) 0.05 % external solution Apply 1 Application topically 2 (two) times daily as needed (blisters).    [provider]  fish oil-omega-3 fatty acids 1000 MG capsule Take 1 g by mouth 4 (four) times daily.    [provider]  Flaxseed, Linseed, (FLAXSEED OIL PO) Take 1 capsule by mouth.    [provider]  gabapentin (NEURONTIN) 600 MG tablet Take 1  tablet (600 mg total) by mouth daily. 12/28/22   Levert Feinstein, MD  Garlic (GARLIQUE PO) Take 1 tablet by mouth daily.    [provider]  HYDROcodone-acetaminophen (NORCO/VICODIN) 5-325 MG tablet Take 1 tablet by mouth 2 (two) times daily. 11/06/21   [provider]  levETIRAcetam (KEPPRA) 500 MG tablet Take 1/2 tablet in the morning, take 1 tablet in the evening 12/28/22   Levert Feinstein, MD  losartan (COZAAR) 100 MG tablet Take 1 tablet (100 mg total) by mouth daily. 05/28/23 08/26/23  Antoine Poche, MD  niacinamide 500 MG tablet Take 500 mg by mouth 3 (three) times daily.    [provider]  Polyethyl Glycol-Propyl Glycol 0.4-0.3 % SOLN Place 1 drop into both eyes 3 (three) times daily.    [provider]  pyridostigmine (MESTINON) 60 MG tablet Take 1 tablet (60 mg total) by mouth 3 (three) times daily as needed. 12/28/22   Levert Feinstein, MD  simvastatin (ZOCOR) 20 MG tablet Take 10 mg by mouth at bedtime. 10/24/21   [provider]  tiZANidine (ZANAFLEX) 4 MG tablet Take 4 mg by mouth at bedtime. 07/19/20   [provider]    Allergies  Allergen Reactions   Iodinated Contrast Media Hives     pt was premedicated/hives reaction occurred 24 hours post injection, Onset Date: [id number]    Sulfa Antibiotics Hives, Swelling and Rash   Bactericin [Bacitracin] Other (See Comments)    Unknown reaction    Norvasc [Amlodipine] Swelling    Leg swelling    REVIEW OF SYSTEMS:  General: no fevers/chills/night sweats Eyes: no blurry vision, diplopia, or amaurosis ENT: no sore throat or hearing loss Resp: no cough, wheezing, or hemoptysis CV: no edema or palpitations GI: no abdominal pain, nausea, vomiting, diarrhea, or constipation GU: no dysuria, frequency, or hematuria Skin: no rash Neuro: no headache, numbness, tingling, or weakness of extremities Musculoskeletal: no joint pain or swelling Heme: no bleeding, DVT, or easy  bruising Endo: no polydipsia  or polyuria  BP 122/82   Pulse 60   Ht 5' 7.5" (1.715 m)   Wt 153 lb (69.4 kg)   SpO2 95%   BMI 23.61 kg/m   PHYSICAL EXAM: GEN:  AO x 3 in no acute distress HEENT: normal Dentition: Normal Neck: JVP normal. +2 carotid upstrokes without bruits. No thyromegaly. Lungs: equal expansion, clear bilaterally CV: Apex is discrete and nondisplaced, RRR with 3/6 SEM Abd: soft, non-tender, non-distended; no bruit; positive bowel sounds Ext: no edema, ecchymoses, or cyanosis; large left inguinal hernia Vascular: 2+ femoral pulses, 2+ radial pulses       Skin: warm and dry without rash Neuro: CN II-XII grossly intact; motor and sensory grossly intact    DATA AND STUDIES:  EKG:  EKG from 1/25 demonstrates SB without BB blocks  EKG Interpretation Date/Time:    Ventricular Rate:    PR Interval:    QRS Duration:    QT Interval:    QTC Calculation:   R Axis:      Text Interpretation:          Cardiac Studies & Procedures   ______________________________________________________________________________________________   STRESS TESTS  EXERCISE TOLERANCE TEST (ETT) 05/10/2023  Narrative   Baseline 0.66mm horizitonal ST depression diffusely.   Baseline diffuse mild diffuse ST depression slightly more prominent in recovery   Exercise capacity (3.4 METs) is 70% of age and gender predicted. May underestimate true exercise capacity as test was stopped primarily due to leg weakness as opposed to fatigue.   ECHOCARDIOGRAM  ECHOCARDIOGRAM COMPLETE 04/19/2023  Narrative ECHOCARDIOGRAM REPORT    Patient Name:   Kyle [name]. Date of Exam: 04/19/2023 Medical Rec #:  [id number]           Height:       67.0 in Accession #:    [id number]          Weight:       154.6 lb Date of Birth:  July 18, 1934            BSA:          1.813 m Patient Age:    88 years            BP:           140/70 mmHg Patient Gender: M                   HR:           72 bpm. Exam Location:  [place]  Procedure: 2D  Echo, Cardiac Doppler and Color Doppler  Indications:    I35.0 Nonrheumatic aortic (valve) stenosis  History:        Patient has prior history of Echocardiogram examinations, most recent 11/10/2022. Aortic Valve Disease, Arrythmias:Atrial Fibrillation and Atrial Flutter; Risk Factors:Hypertension, Dyslipidemia and Non-Smoker.  Sonographer:    Dominica Severin RCS, RVS Referring Phys: [id number] Dorothe Pea BRANCH   Sonographer Comments: Global longitudinal strain was attempted. IMPRESSIONS   1. Left ventricular ejection fraction, by estimation, is 60 to 65%. The left ventricle has normal function. The left ventricle has no regional wall motion abnormalities. There is moderate concentric left ventricular hypertrophy. Left ventricular diastolic parameters are consistent with Grade II diastolic dysfunction (pseudonormalization). 2. Right ventricular systolic function is normal. The right ventricular size is normal. There is mildly elevated pulmonary artery systolic pressure. The estimated right ventricular systolic pressure is 44.2 mmHg. 3. Left atrial size was severely dilated. 4. The mitral valve  is degenerative. Mild mitral valve regurgitation. 5. Tricuspid valve regurgitation is moderate. 6. The aortic valve is tricuspid. There is severe calcifcation of the aortic valve. Aortic valve regurgitation is trivial. Severe aortic valve stenosis. Aortic valve mean gradient measures 32.5 mmHg. Dimentionless index 0.24. 7. The inferior vena cava is normal in size with greater than 50% respiratory variability, suggesting right atrial pressure of 3 mmHg.  Comparison(s): Prior images reviewed side by side. LVEF normal range at 60-65%. Aortic stenosis severe as before with stable mean AV gradient 32 mmHg.  FINDINGS Left Ventricle: Left ventricular ejection fraction, by estimation, is 60 to 65%. The left ventricle has normal function. The left ventricle has no regional wall motion abnormalities. Global  longitudinal strain performed but not reported based on interpreter judgement due to suboptimal tracking. The left ventricular internal cavity size was normal in size. There is moderate concentric left ventricular hypertrophy. Left ventricular diastolic parameters are consistent with Grade II diastolic dysfunction (pseudonormalization).  Right Ventricle: The right ventricular size is normal. No increase in right ventricular wall thickness. Right ventricular systolic function is normal. There is mildly elevated pulmonary artery systolic pressure. The tricuspid regurgitant velocity is 3.21 m/s, and with an assumed right atrial pressure of 3 mmHg, the estimated right ventricular systolic pressure is 44.2 mmHg.  Left Atrium: Left atrial size was severely dilated.  Right Atrium: Right atrial size was normal in size.  Pericardium: Trivial pericardial effusion is present. The pericardial effusion is posterior to the left ventricle.  Mitral Valve: The mitral valve is degenerative in appearance. There is mild thickening of the mitral valve leaflet(s). Mild mitral annular calcification. Mild mitral valve regurgitation. MV peak gradient, 5.4 mmHg. The mean mitral valve gradient is 1.0 mmHg.  Tricuspid Valve: The tricuspid valve is grossly normal. Tricuspid valve regurgitation is moderate.  Aortic Valve: The aortic valve is tricuspid. There is severe calcifcation of the aortic valve. There is moderate aortic valve annular calcification. Aortic valve regurgitation is trivial. Severe aortic stenosis is present. Aortic valve mean gradient measures 32.5 mmHg. Aortic valve peak gradient measures 54.6 mmHg. Aortic valve area, by VTI measures 0.74 cm.  Pulmonic Valve: The pulmonic valve was grossly normal. Pulmonic valve regurgitation is trivial.  Aorta: The aortic root and ascending aorta are structurally normal, with no evidence of dilitation.  Venous: The inferior vena cava is normal in size with greater  than 50% respiratory variability, suggesting right atrial pressure of 3 mmHg.  IAS/Shunts: No atrial level shunt detected by color flow Doppler.   LEFT VENTRICLE PLAX 2D LVIDd:         4.10 cm     Diastology LVIDs:         2.50 cm     LV e' medial:    5.66 cm/s LV PW:         1.40 cm     LV E/e' medial:  13.9 LV IVS:        1.50 cm     LV e' lateral:   7.62 cm/s LVOT diam:     2.00 cm     LV E/e' lateral: 10.3 LV SV:         61 LV SV Index:   33 LVOT Area:     3.14 cm  LV Volumes (MOD) LV vol d, MOD A2C: 64.3 ml LV vol d, MOD A4C: 72.5 ml LV vol s, MOD A2C: 31.6 ml LV vol s, MOD A4C: 26.7 ml LV SV MOD A2C:  32.7 ml LV SV MOD A4C:     72.5 ml LV SV MOD BP:      39.2 ml  RIGHT VENTRICLE RV Basal diam:  4.50 cm RV Mid diam:    3.30 cm RV S prime:     12.60 cm/s TAPSE (M-mode): 1.7 cm  LEFT ATRIUM              Index        RIGHT ATRIUM           Index LA diam:        4.60 cm  2.54 cm/m   RA Area:     19.10 cm LA Vol (A2C):   87.1 ml  48.05 ml/m  RA Volume:   57.50 ml  31.72 ml/m LA Vol (A4C):   108.0 ml 59.58 ml/m LA Biplane Vol: 97.2 ml  53.62 ml/m AORTIC VALVE                     PULMONIC VALVE AV Area (Vmax):    0.69 cm      PV Vmax:          1.12 m/s AV Area (Vmean):   0.69 cm      PV Peak grad:     5.0 mmHg AV Area (VTI):     0.74 cm      PR End Diast Vel: 8.07 msec AV Vmax:           369.50 cm/s AV Vmean:          275.000 cm/s AV VTI:            0.818 m AV Peak Grad:      54.6 mmHg AV Mean Grad:      32.5 mmHg LVOT Vmax:         80.90 cm/s LVOT Vmean:        60.800 cm/s LVOT VTI:          0.193 m LVOT/AV VTI ratio: 0.24  AORTA Ao Root diam: 3.20 cm Ao Asc diam:  3.60 cm  MITRAL VALVE               TRICUSPID VALVE MV Area (PHT): 1.76 cm    TR Peak grad:   41.2 mmHg MV Area VTI:   1.68 cm    TR Vmax:        321.00 cm/s MV Peak grad:  5.4 mmHg MV Mean grad:  1.0 mmHg    SHUNTS MV Vmax:       1.16 m/s    Systemic VTI:  0.19 m MV Vmean:       52.5 cm/s   Systemic Diam: 2.00 cm MV Decel Time: 430 msec MV E velocity: 78.40 cm/s MV A velocity: 35.60 cm/s MV E/A ratio:  2.20  Nona Dell MD Electronically signed by Nona Dell MD Signature Date/Time: 04/19/2023/12:54:48 PM    Final   TEE  ECHO INTRAOPERATIVE TEE 04/20/2022  Narrative *INTRAOPERATIVE TRANSESOPHAGEAL REPORT *    Patient Name:   Kyle [name]. Date of Exam: 04/20/2022 Medical Rec #:  [id number]           Height:       68.0 in Accession #:    [id number]          Weight:       155.9 lb Date of Birth:  [birth year]            BSA:  1.84 m Patient Age:    87 years            BP:           143/78 mmHg Patient Gender: M                   HR:           50 bpm. Exam Location:  Anesthesiology  Transesophogeal exam was perform intraoperatively during surgical procedure. Patient was closely monitored under general anesthesia during the entirety of examination.  Indications:     I48.91* Unspecified atrial fibrillation Sonographer:     Irving Burton Senior RDCS Performing Phys: [id number] Waverly Ferrari ENTER Diagnosing Phys: Marcene Duos MD  Complications: No known complications during this procedure. POST-OP IMPRESSIONS _ Comments: LAA no longer visible following LAA clippng procedure.  PRE-OP FINDINGS Left Ventricle: The left ventricle has normal systolic function, with an ejection fraction of 60-65%. The cavity size was normal. There is no left ventricular hypertrophy.   Right Ventricle: The right ventricle has normal systolic function. The cavity was normal. There is no increase in right ventricular wall thickness.  Left Atrium: Left atrial size was normal in size. No left atrial/left atrial appendage thrombus was detected.  Right Atrium: Right atrial size was normal in size.  Interatrial Septum: No atrial level shunt detected by color flow Doppler.  Pericardium: There is no evidence of pericardial effusion.  Mitral Valve: The mitral valve is  normal in structure. Mitral valve regurgitation is trivial by color flow Doppler. There is No evidence of mitral stenosis.  Tricuspid Valve: The tricuspid valve was normal in structure. Tricuspid valve regurgitation is trivial by color flow Doppler.  Aortic Valve: The aortic valve is tricuspid Aortic valve regurgitation is trivial by color flow Doppler. There is mild stenosis of the aortic valve, with a calculated valve area of 1.12 cm. There is mild thickening and moderate calcification present on the aortic valve right coronary, left coronary and non-coronary cusps with mildly decreased mobility.  Pulmonic Valve: The pulmonic valve was normal in structure. Pulmonic valve regurgitation is trivial by color flow Doppler.   Aorta: The aortic root, ascending aorta and aortic arch are normal in size and structure.  +--------------+--------++ LEFT VENTRICLE         +--------------+--------++ PLAX 2D                +--------------+--------++ LVOT diam:    2.20 cm  +--------------+--------++ LVOT Area:    3.80 cm +--------------+--------++                        +--------------+--------++  +------------------+------------++ AORTIC VALVE                   +------------------+------------++ AV Area (Vmax):   1.03 cm     +------------------+------------++ AV Area (Vmean):  1.07 cm     +------------------+------------++ AV Area (VTI):    1.12 cm     +------------------+------------++ AV Vmax:          254.00 cm/s  +------------------+------------++ AV Vmean:         192.000 cm/s +------------------+------------++ AV VTI:           0.587 m      +------------------+------------++ AV Peak Grad:     25.8 mmHg    +------------------+------------++ AV Mean Grad:     16.0 mmHg    +------------------+------------++ LVOT Vmax:        69.00 cm/s   +------------------+------------++  LVOT Vmean:       54.100 cm/s   +------------------+------------++ LVOT VTI:         0.173 m      +------------------+------------++ LVOT/AV VTI ratio:0.29         +------------------+------------++  +------------+-------++ AORTA               +------------+-------++ Ao Asc diam:3.30 cm +------------+-------++   +--------------+-------+ SHUNTS                +--------------+-------+ Systemic VTI: 0.17 m  +--------------+-------+ Systemic Diam:2.20 cm +--------------+-------+   Marcene Duos MD Electronically signed by Marcene Duos MD Signature Date/Time: 04/23/2022/4:45:39 PM    Final  MONITORS  LONG TERM MONITOR (3-14 DAYS) 07/07/2022  Narrative   14 day monitor   Rare supraventricular ectopy in the form of isolated PACs, couplets, triplets. 12 runs of SVT longest 9 beats.   Rare ventricular ectopy in the form of isolated PVCs, couplets, triplets. Isolated run of NSVT 4 beats.   No symptoms reported   Patch Wear Time:  13 days and 22 hours (2024-03-01T18:33:20-0500 to 2024-03-15T18:28:06-398)  Patient had a min HR of 40 bpm, max HR of 146 bpm, and avg HR of 60 bpm. Predominant underlying rhythm was Sinus Rhythm. 1 run of Ventricular Tachycardia occurred lasting 4 beats with a max rate of 144 bpm (avg 131 bpm). 12 Supraventricular Tachycardia runs occurred, the run with the fastest interval lasting 8 beats with a max rate of 146 bpm, the longest lasting 9 beats with an avg rate of 90 bpm. Isolated SVEs were rare (<1.0%), SVE Couplets were rare (<1.0%), and SVE Triplets were rare (<1.0%). Isolated VEs were rare (<1.0%, 2449), VE Couplets were rare (<1.0%, 29), and VE Triplets were rare (<1.0%, 5). Ventricular Bigeminy and Trigeminy were present.       ______________________________________________________________________________________________      01/10/2023: Hemoglobin 13.0; Magnesium 2.0; Platelets 202 02/09/2023: TSH 0.716 05/07/2023: ALT 18; BUN 23;  Creatinine, Ser 1.35; Potassium 4.8; Sodium 142   STS RISK CALCULATOR: Pending  NHYA CLASS: 2    ASSESSMENT AND PLAN:   1. Nonrheumatic aortic valve stenosis   2. Typical atrial flutter (HCC)   3. Essential hypertension, benign   4. Hyperlipidemia LDL goal <70   5. Aortic atherosclerosis (HCC)   6. Stage 3a chronic kidney disease (HCC)   7. BMI 24.0-24.9, adult   8. Pre-procedure lab exam     AS: The patient has developed severe aortic stenosis.  I think he would be much more symptomatic if he were not limited by his inguinal hernia.  He does report some fatigue as well.  He is requiring inguinal hernia surgery.  For this reason we will pursue an evaluation for an aortic valve intervention.  The patient has a severe allergy to IV dye.  It seems like he was not premedicated with a 3 drug regimen in the past.  I think the thing to do is to proceed to a coronary angiography and right heart catheterization study with standard 3 drug premedication regimen.  If this goes fine and without issues then we can proceed to a CT scan.  If this is problematic then we will have to consider a surgical or aortic valve replacement in this elderly individual.  Will plan premedication with prednisone, Benadryl, and Pepcid. Atrial flutter:  LAA closure, not on A/C.  Continue amiodarone 200mg  twice a week and 100mg  all other days. HTN: Blood pressure is well-controlled today.  Continue losartan 100 mg  daily. Hyperlipidemia:  Continue simvastatin 20mg  daily. Aortic atherosclerosis:  Start ASA 81mg  and continue simvastatin 20mg  daily. CKD IIIa:  Continue losartan 100mg  for renal protection, consider SGLT2i. Elevated BMI:  Diet and exercise modification after AS has been managed.  I have personally reviewed the patients imaging data as summarized above.  I have reviewed the natural history of aortic stenosis with the patient and family members who are present today. We have discussed the limitations of medical  therapy and the poor prognosis associated with symptomatic aortic stenosis. We have also reviewed potential treatment options, including palliative medical therapy, conventional surgical aortic valve replacement, and transcatheter aortic valve replacement. We discussed treatment options in the context of this patient's specific comorbid medical conditions.   All of the patient's questions were answered today. Will make further recommendations based on the results of studies outlined above.   I spent 51 minutes reviewing all clinical data during and prior to this visit including all relevant imaging studies, laboratories, clinical information from other health systems and prior notes from both Cardiology and other specialties, interviewing the patient, conducting a complete physical examination, and coordinating care in order to formulate a comprehensive and personalized evaluation and treatment plan.   Orbie Pyo, MD  06/04/2023 10:08 AM    [facility] [address] [place], [place], [place]  [address] Phone: [phone]; Fax: [phone]

## 2023-06-02 NOTE — Progress Notes (Unsigned)
 Patient ID: Kyle [name]. MRN: [id number] DOB/AGE: 87/20/1936 87 y.o.  Primary Care Physician:Fusco, Lyman Bishop, MD Primary Cardiologist: Dominga Ferry  CC:  Aortic valvular disease management     FOCUSED PROBLEM LIST:   AS AVA 0.59, DI 0.2, SVI 29, MG 32, EF 60-65% TTE July 2024 AVA 0.74, DI 0.24, MG 33, EF 60-65% January 2025 EKG 1/25 SR without BBB Atrial flutter CV 7/22, 8/23, 11/23, 9/24 S/p thorascopic closure LAA 04/2022 Hypertension Hyperlipidemia Aortic atherosclerosis Chest CT 2024 CKD IIIa BMI 24 Frailty IV dye allergy  Discussed the use of AI scribe software for clinical note transcription with the patient, who gave verbal consent to proceed.  History of Present Illness        2/25:  The patient is a 89M with the above listed problems here for recommendations regarding his aortic stenosis.  He has been followed by Dr. Wyline Mood for some time.  His AS has been noted to be severe and he was referred for an ETT recently which demonstrated reduced exercise capacity and diffuse ST depressions.  He has a history of arrhythmia and has had 3 cardioversions in the past.. He underwent a procedure to close the appendage to discontinue blood thinners and is currently not on blood thinners. He also has a history of hypertension and is on medication for it. No diabetes, but he mentions mild chronic kidney disease.  He has a history of allergy to CT scan dye, causing a severe skin reaction resembling a sunburn, with itching and peeling. This reaction occurred several years ago and again in 2018 with a half dose of the dye. Premedication with three medications was attempted in the past but was ineffective.  He has a left-sided hernia that has been increasing in size over the past six months, causing discomfort, especially when walking. The hernia becomes softer when lying down but hardens when standing.  He has a history of two blood clots in his right leg, resulting in some  persistent swelling. No current significant swelling or problems with breathing while lying flat. He sleeps in a recliner due to leg discomfort and cramps, for which he has been prescribed medications.  He has a history of brain tumor surgery in 1993 and a colon perforation in 2018, which required a tube placement and subsequent monitoring. He also has a history of diverticulitis. No current issues with his teeth, although he had a bridge removed last year and was advised to have two teeth pulled, which are not currently bothering him.         Past Medical History:  Diagnosis Date   Arthritis    Atrial fibrillation (HCC)    Auto immune neutropenia (HCC)    Bullous pemphigoid    Cancer (HCC)    Prostate   Cervical spondylosis without myelopathy 01/22/2016   Chronic kidney disease    Per MD note 05/2021 CKD stage non specified   Complication of anesthesia    difficult to wake up after anesthesia   DVT (deep venous thrombosis) (HCC)    a. Has had a previous right lower extremity DVT in the setting of hospitalization and surgery (after his brain tumor was resected in 1993) but is no longer on Coumadin.   Dyslipidemia    Dysrhythmia    History of kidney stones    History of prostate cancer    Hypertension    Meningioma (HCC)    a. s/p resection 1990s.   Myasthenia (HCC) 02/14/2013   Nocturnal leg  cramps    Ocular myasthenia gravis (HCC)    Peripheral neuropathy 11/23/2018   Seizures (HCC)    Sinus bradycardia    Sleep paralysis    Thyroid nodule, cold     Past Surgical History:  Procedure Laterality Date   BLADDER SURGERY     removed part of the bladder   BRAIN SURGERY     CARDIOVERSION N/A 11/07/2020   Procedure: CARDIOVERSION;  Surgeon: Antoine Poche, MD;  Location: [facility];  Service: Endoscopy;  Laterality: N/A;   CARDIOVERSION N/A 11/20/2021   Procedure: CARDIOVERSION;  Surgeon: Antoine Poche, MD;  Location: [facility];  Service: Endoscopy;  Laterality: N/A;    CHOLECYSTECTOMY     CLIPPING OF ATRIAL APPENDAGE N/A 04/20/2022   Procedure: CLIPPING OF ATRIAL APPENDAGE USING 45 ATRIAL CLIP;  Surgeon: Lyn Hollingshead, MD;  Location: [facility];  Service: Open Heart Surgery;  Laterality: N/A;   COLONOSCOPY N/A 07/25/2013   Procedure: COLONOSCOPY;  Surgeon: Dalia Heading, MD;  Location: [facility];  Service: Gastroenterology;  Laterality: N/A;   COLONOSCOPY N/A 11/10/2016   Procedure: COLONOSCOPY;  Surgeon: Franky Macho, MD;  Location: [facility];  Service: Gastroenterology;  Laterality: N/A;   IR CATHETER TUBE CHANGE  01/02/2017   IR RADIOLOGIST EVAL & MGMT  01/06/2017   IR RADIOLOGIST EVAL & MGMT  01/20/2017   IR RADIOLOGIST EVAL & MGMT  02/03/2017   IR RADIOLOGIST EVAL & MGMT  02/17/2017   IR SINUS/FIST TUBE CHK-NON GI  03/03/2017   PARTIAL COLECTOMY     RADIOACTIVE SEED IMPLANT     TEE WITHOUT CARDIOVERSION N/A 04/20/2022   Procedure: TRANSESOPHAGEAL ECHOCARDIOGRAM (TEE);  Surgeon: Lyn Hollingshead, MD;  Location: [facility] OR;  Service: Open Heart Surgery;  Laterality: N/A;   VIDEO ASSISTED THORACOSCOPY Left 04/20/2022   Procedure: VIDEO ASSISTED THORACOSCOPY;  Surgeon: Lyn Hollingshead, MD;  Location: [facility] OR;  Service: Thoracic;  Laterality: Left;    Family History  Problem Relation Age of Onset   Heart failure Mother    Diabetes Mother    Diabetes Sister    Dementia Sister    Lung cancer Son    Heart failure Daughter    Colon cancer Neg Hx     Social History   Socioeconomic History   Marital status: Married    Spouse name: Diplomatic Services operational officer   Number of children: 1   Years of education: 12 TH   Highest education level: Not on file  Occupational History   Occupation: retired  Tobacco Use   Smoking status: Never    Passive exposure: Never   Smokeless tobacco: Never  Vaping Use   Vaping status: Never Used  Substance and Sexual Activity   Alcohol use: No   Drug use: No   Sexual activity: Not on file  Other Topics Concern   Not on file  Social History  Narrative   Patient is right handed.   Patient drinks 1 cup of coffee daily.   Social Drivers of Corporate investment banker Strain: Low Risk  (12/15/2022)   Overall Financial Resource Strain (CARDIA)    Difficulty of Paying Living Expenses: Not hard at all  Food Insecurity: Low Risk  (12/17/2022)   Received from [facility]   Hunger Vital Sign    Worried About Running Out of Food in the Last Year: Never true    Ran Out of Food in the Last Year: Never true  Transportation Needs: No Transportation Needs (12/17/2022)  Received from [facility]    In the past 12 months, has lack of reliable transportation kept you from medical appointments, meetings, work or from getting things needed for daily living? : No  Physical Activity: Sufficiently Active (11/09/2022)   Exercise Vital Sign    Days of Exercise per Week: 7 days    Minutes of Exercise per Session: 30 min  Stress: Not on file  Social Connections: Not on file  Intimate Partner Violence: Not At Risk (06/09/2022)   Humiliation, Afraid, Rape, and Kick questionnaire    Fear of Current or Ex-Partner: No    Emotionally Abused: No    Physically Abused: No    Sexually Abused: No     Prior to Admission medications   Medication Sig Start Date End Date Taking? Authorizing Provider  amiodarone (PACERONE) 200 MG tablet Take 200 mg Daily on Saturday, Sunday Take 100 mg Daily Monday -Friday 11/12/22   Marinus Maw, MD  cholecalciferol (VITAMIN D3) 25 MCG (1000 UNIT) tablet Take 1,000 Units by mouth daily.    [provider]  clobetasol (TEMOVATE) 0.05 % external solution Apply 1 Application topically 2 (two) times daily as needed (blisters).    [provider]  fish oil-omega-3 fatty acids 1000 MG capsule Take 1 g by mouth 4 (four) times daily.    [provider]  Flaxseed, Linseed, (FLAXSEED OIL PO) Take 1 capsule by mouth.    [provider]  gabapentin (NEURONTIN) 600 MG tablet Take 1  tablet (600 mg total) by mouth daily. 12/28/22   Levert Feinstein, MD  Garlic (GARLIQUE PO) Take 1 tablet by mouth daily.    [provider]  HYDROcodone-acetaminophen (NORCO/VICODIN) 5-325 MG tablet Take 1 tablet by mouth 2 (two) times daily. 11/06/21   [provider]  levETIRAcetam (KEPPRA) 500 MG tablet Take 1/2 tablet in the morning, take 1 tablet in the evening 12/28/22   Levert Feinstein, MD  losartan (COZAAR) 100 MG tablet Take 1 tablet (100 mg total) by mouth daily. 05/28/23 08/26/23  Antoine Poche, MD  niacinamide 500 MG tablet Take 500 mg by mouth 3 (three) times daily.    [provider]  Polyethyl Glycol-Propyl Glycol 0.4-0.3 % SOLN Place 1 drop into both eyes 3 (three) times daily.    [provider]  pyridostigmine (MESTINON) 60 MG tablet Take 1 tablet (60 mg total) by mouth 3 (three) times daily as needed. 12/28/22   Levert Feinstein, MD  simvastatin (ZOCOR) 20 MG tablet Take 10 mg by mouth at bedtime. 10/24/21   [provider]  tiZANidine (ZANAFLEX) 4 MG tablet Take 4 mg by mouth at bedtime. 07/19/20   [provider]    Allergies  Allergen Reactions   Iodinated Contrast Media Hives     pt was premedicated/hives reaction occurred 24 hours post injection, Onset Date: [id number]    Sulfa Antibiotics Hives, Swelling and Rash   Bactericin [Bacitracin] Other (See Comments)    Unknown reaction    Norvasc [Amlodipine] Swelling    Leg swelling    REVIEW OF SYSTEMS:  General: no fevers/chills/night sweats Eyes: no blurry vision, diplopia, or amaurosis ENT: no sore throat or hearing loss Resp: no cough, wheezing, or hemoptysis CV: no edema or palpitations GI: no abdominal pain, nausea, vomiting, diarrhea, or constipation GU: no dysuria, frequency, or hematuria Skin: no rash Neuro: no headache, numbness, tingling, or weakness of extremities Musculoskeletal: no joint pain or swelling Heme: no bleeding, DVT, or easy  bruising Endo: no polydipsia  or polyuria  BP 122/82   Pulse 60   Ht 5' 7.5" (1.715 m)   Wt 153 lb (69.4 kg)   SpO2 95%   BMI 23.61 kg/m   PHYSICAL EXAM: GEN:  AO x 3 in no acute distress HEENT: normal Dentition: Normal Neck: JVP normal. +2 carotid upstrokes without bruits. No thyromegaly. Lungs: equal expansion, clear bilaterally CV: Apex is discrete and nondisplaced, RRR with 3/6 SEM Abd: soft, non-tender, non-distended; no bruit; positive bowel sounds Ext: no edema, ecchymoses, or cyanosis; large left inguinal hernia Vascular: 2+ femoral pulses, 2+ radial pulses       Skin: warm and dry without rash Neuro: CN II-XII grossly intact; motor and sensory grossly intact    DATA AND STUDIES:  EKG:  EKG from 1/25 demonstrates SB without BB blocks  EKG Interpretation Date/Time:    Ventricular Rate:    PR Interval:    QRS Duration:    QT Interval:    QTC Calculation:   R Axis:      Text Interpretation:          Cardiac Studies & Procedures   ______________________________________________________________________________________________   STRESS TESTS  EXERCISE TOLERANCE TEST (ETT) 05/10/2023  Narrative   Baseline 0.33mm horizitonal ST depression diffusely.   Baseline diffuse mild diffuse ST depression slightly more prominent in recovery   Exercise capacity (3.4 METs) is 70% of age and gender predicted. May underestimate true exercise capacity as test was stopped primarily due to leg weakness as opposed to fatigue.   ECHOCARDIOGRAM  ECHOCARDIOGRAM COMPLETE 04/19/2023  Narrative ECHOCARDIOGRAM REPORT    Patient Name:   Kyle [name]. Date of Exam: 04/19/2023 Medical Rec #:  [id number]           Height:       67.0 in Accession #:    [id number]          Weight:       154.6 lb Date of Birth:  04-Oct-1934            BSA:          1.813 m Patient Age:    88 years            BP:           140/70 mmHg Patient Gender: M                   HR:           72 bpm. Exam Location:  [place]  Procedure: 2D  Echo, Cardiac Doppler and Color Doppler  Indications:    I35.0 Nonrheumatic aortic (valve) stenosis  History:        Patient has prior history of Echocardiogram examinations, most recent 11/10/2022. Aortic Valve Disease, Arrythmias:Atrial Fibrillation and Atrial Flutter; Risk Factors:Hypertension, Dyslipidemia and Non-Smoker.  Sonographer:    Dominica Severin RCS, RVS Referring Phys: [id number] Dorothe Pea BRANCH   Sonographer Comments: Global longitudinal strain was attempted. IMPRESSIONS   1. Left ventricular ejection fraction, by estimation, is 60 to 65%. The left ventricle has normal function. The left ventricle has no regional wall motion abnormalities. There is moderate concentric left ventricular hypertrophy. Left ventricular diastolic parameters are consistent with Grade II diastolic dysfunction (pseudonormalization). 2. Right ventricular systolic function is normal. The right ventricular size is normal. There is mildly elevated pulmonary artery systolic pressure. The estimated right ventricular systolic pressure is 44.2 mmHg. 3. Left atrial size was severely dilated. 4. The mitral valve  is degenerative. Mild mitral valve regurgitation. 5. Tricuspid valve regurgitation is moderate. 6. The aortic valve is tricuspid. There is severe calcifcation of the aortic valve. Aortic valve regurgitation is trivial. Severe aortic valve stenosis. Aortic valve mean gradient measures 32.5 mmHg. Dimentionless index 0.24. 7. The inferior vena cava is normal in size with greater than 50% respiratory variability, suggesting right atrial pressure of 3 mmHg.  Comparison(s): Prior images reviewed side by side. LVEF normal range at 60-65%. Aortic stenosis severe as before with stable mean AV gradient 32 mmHg.  FINDINGS Left Ventricle: Left ventricular ejection fraction, by estimation, is 60 to 65%. The left ventricle has normal function. The left ventricle has no regional wall motion abnormalities. Global  longitudinal strain performed but not reported based on interpreter judgement due to suboptimal tracking. The left ventricular internal cavity size was normal in size. There is moderate concentric left ventricular hypertrophy. Left ventricular diastolic parameters are consistent with Grade II diastolic dysfunction (pseudonormalization).  Right Ventricle: The right ventricular size is normal. No increase in right ventricular wall thickness. Right ventricular systolic function is normal. There is mildly elevated pulmonary artery systolic pressure. The tricuspid regurgitant velocity is 3.21 m/s, and with an assumed right atrial pressure of 3 mmHg, the estimated right ventricular systolic pressure is 44.2 mmHg.  Left Atrium: Left atrial size was severely dilated.  Right Atrium: Right atrial size was normal in size.  Pericardium: Trivial pericardial effusion is present. The pericardial effusion is posterior to the left ventricle.  Mitral Valve: The mitral valve is degenerative in appearance. There is mild thickening of the mitral valve leaflet(s). Mild mitral annular calcification. Mild mitral valve regurgitation. MV peak gradient, 5.4 mmHg. The mean mitral valve gradient is 1.0 mmHg.  Tricuspid Valve: The tricuspid valve is grossly normal. Tricuspid valve regurgitation is moderate.  Aortic Valve: The aortic valve is tricuspid. There is severe calcifcation of the aortic valve. There is moderate aortic valve annular calcification. Aortic valve regurgitation is trivial. Severe aortic stenosis is present. Aortic valve mean gradient measures 32.5 mmHg. Aortic valve peak gradient measures 54.6 mmHg. Aortic valve area, by VTI measures 0.74 cm.  Pulmonic Valve: The pulmonic valve was grossly normal. Pulmonic valve regurgitation is trivial.  Aorta: The aortic root and ascending aorta are structurally normal, with no evidence of dilitation.  Venous: The inferior vena cava is normal in size with greater  than 50% respiratory variability, suggesting right atrial pressure of 3 mmHg.  IAS/Shunts: No atrial level shunt detected by color flow Doppler.   LEFT VENTRICLE PLAX 2D LVIDd:         4.10 cm     Diastology LVIDs:         2.50 cm     LV e' medial:    5.66 cm/s LV PW:         1.40 cm     LV E/e' medial:  13.9 LV IVS:        1.50 cm     LV e' lateral:   7.62 cm/s LVOT diam:     2.00 cm     LV E/e' lateral: 10.3 LV SV:         61 LV SV Index:   33 LVOT Area:     3.14 cm  LV Volumes (MOD) LV vol d, MOD A2C: 64.3 ml LV vol d, MOD A4C: 72.5 ml LV vol s, MOD A2C: 31.6 ml LV vol s, MOD A4C: 26.7 ml LV SV MOD A2C:  32.7 ml LV SV MOD A4C:     72.5 ml LV SV MOD BP:      39.2 ml  RIGHT VENTRICLE RV Basal diam:  4.50 cm RV Mid diam:    3.30 cm RV S prime:     12.60 cm/s TAPSE (M-mode): 1.7 cm  LEFT ATRIUM              Index        RIGHT ATRIUM           Index LA diam:        4.60 cm  2.54 cm/m   RA Area:     19.10 cm LA Vol (A2C):   87.1 ml  48.05 ml/m  RA Volume:   57.50 ml  31.72 ml/m LA Vol (A4C):   108.0 ml 59.58 ml/m LA Biplane Vol: 97.2 ml  53.62 ml/m AORTIC VALVE                     PULMONIC VALVE AV Area (Vmax):    0.69 cm      PV Vmax:          1.12 m/s AV Area (Vmean):   0.69 cm      PV Peak grad:     5.0 mmHg AV Area (VTI):     0.74 cm      PR End Diast Vel: 8.07 msec AV Vmax:           369.50 cm/s AV Vmean:          275.000 cm/s AV VTI:            0.818 m AV Peak Grad:      54.6 mmHg AV Mean Grad:      32.5 mmHg LVOT Vmax:         80.90 cm/s LVOT Vmean:        60.800 cm/s LVOT VTI:          0.193 m LVOT/AV VTI ratio: 0.24  AORTA Ao Root diam: 3.20 cm Ao Asc diam:  3.60 cm  MITRAL VALVE               TRICUSPID VALVE MV Area (PHT): 1.76 cm    TR Peak grad:   41.2 mmHg MV Area VTI:   1.68 cm    TR Vmax:        321.00 cm/s MV Peak grad:  5.4 mmHg MV Mean grad:  1.0 mmHg    SHUNTS MV Vmax:       1.16 m/s    Systemic VTI:  0.19 m MV Vmean:       52.5 cm/s   Systemic Diam: 2.00 cm MV Decel Time: 430 msec MV E velocity: 78.40 cm/s MV A velocity: 35.60 cm/s MV E/A ratio:  2.20  Nona Dell MD Electronically signed by Nona Dell MD Signature Date/Time: 04/19/2023/12:54:48 PM    Final   TEE  ECHO INTRAOPERATIVE TEE 04/20/2022  Narrative *INTRAOPERATIVE TRANSESOPHAGEAL REPORT *    Patient Name:   Kyle [name]. Date of Exam: 04/20/2022 Medical Rec #:  [id number]           Height:       68.0 in Accession #:    [id number]          Weight:       155.9 lb Date of Birth:  August 03, 1934            BSA:  1.84 m Patient Age:    87 years            BP:           143/78 mmHg Patient Gender: M                   HR:           50 bpm. Exam Location:  Anesthesiology  Transesophogeal exam was perform intraoperatively during surgical procedure. Patient was closely monitored under general anesthesia during the entirety of examination.  Indications:     I48.91* Unspecified atrial fibrillation Sonographer:     Irving Burton Senior RDCS Performing Phys: [id number] Waverly Ferrari ENTER Diagnosing Phys: Marcene Duos MD  Complications: No known complications during this procedure. POST-OP IMPRESSIONS _ Comments: LAA no longer visible following LAA clippng procedure.  PRE-OP FINDINGS Left Ventricle: The left ventricle has normal systolic function, with an ejection fraction of 60-65%. The cavity size was normal. There is no left ventricular hypertrophy.   Right Ventricle: The right ventricle has normal systolic function. The cavity was normal. There is no increase in right ventricular wall thickness.  Left Atrium: Left atrial size was normal in size. No left atrial/left atrial appendage thrombus was detected.  Right Atrium: Right atrial size was normal in size.  Interatrial Septum: No atrial level shunt detected by color flow Doppler.  Pericardium: There is no evidence of pericardial effusion.  Mitral Valve: The mitral valve is  normal in structure. Mitral valve regurgitation is trivial by color flow Doppler. There is No evidence of mitral stenosis.  Tricuspid Valve: The tricuspid valve was normal in structure. Tricuspid valve regurgitation is trivial by color flow Doppler.  Aortic Valve: The aortic valve is tricuspid Aortic valve regurgitation is trivial by color flow Doppler. There is mild stenosis of the aortic valve, with a calculated valve area of 1.12 cm. There is mild thickening and moderate calcification present on the aortic valve right coronary, left coronary and non-coronary cusps with mildly decreased mobility.  Pulmonic Valve: The pulmonic valve was normal in structure. Pulmonic valve regurgitation is trivial by color flow Doppler.   Aorta: The aortic root, ascending aorta and aortic arch are normal in size and structure.  +--------------+--------++ LEFT VENTRICLE         +--------------+--------++ PLAX 2D                +--------------+--------++ LVOT diam:    2.20 cm  +--------------+--------++ LVOT Area:    3.80 cm +--------------+--------++                        +--------------+--------++  +------------------+------------++ AORTIC VALVE                   +------------------+------------++ AV Area (Vmax):   1.03 cm     +------------------+------------++ AV Area (Vmean):  1.07 cm     +------------------+------------++ AV Area (VTI):    1.12 cm     +------------------+------------++ AV Vmax:          254.00 cm/s  +------------------+------------++ AV Vmean:         192.000 cm/s +------------------+------------++ AV VTI:           0.587 m      +------------------+------------++ AV Peak Grad:     25.8 mmHg    +------------------+------------++ AV Mean Grad:     16.0 mmHg    +------------------+------------++ LVOT Vmax:        69.00 cm/s   +------------------+------------++  LVOT Vmean:       54.100 cm/s   +------------------+------------++ LVOT VTI:         0.173 m      +------------------+------------++ LVOT/AV VTI ratio:0.29         +------------------+------------++  +------------+-------++ AORTA               +------------+-------++ Ao Asc diam:3.30 cm +------------+-------++   +--------------+-------+ SHUNTS                +--------------+-------+ Systemic VTI: 0.17 m  +--------------+-------+ Systemic Diam:2.20 cm +--------------+-------+   Marcene Duos MD Electronically signed by Marcene Duos MD Signature Date/Time: 04/23/2022/4:45:39 PM    Final  MONITORS  LONG TERM MONITOR (3-14 DAYS) 07/07/2022  Narrative   14 day monitor   Rare supraventricular ectopy in the form of isolated PACs, couplets, triplets. 12 runs of SVT longest 9 beats.   Rare ventricular ectopy in the form of isolated PVCs, couplets, triplets. Isolated run of NSVT 4 beats.   No symptoms reported   Patch Wear Time:  13 days and 22 hours (2024-03-01T18:33:20-0500 to 2024-03-15T18:28:06-398)  Patient had a min HR of 40 bpm, max HR of 146 bpm, and avg HR of 60 bpm. Predominant underlying rhythm was Sinus Rhythm. 1 run of Ventricular Tachycardia occurred lasting 4 beats with a max rate of 144 bpm (avg 131 bpm). 12 Supraventricular Tachycardia runs occurred, the run with the fastest interval lasting 8 beats with a max rate of 146 bpm, the longest lasting 9 beats with an avg rate of 90 bpm. Isolated SVEs were rare (<1.0%), SVE Couplets were rare (<1.0%), and SVE Triplets were rare (<1.0%). Isolated VEs were rare (<1.0%, 2449), VE Couplets were rare (<1.0%, 29), and VE Triplets were rare (<1.0%, 5). Ventricular Bigeminy and Trigeminy were present.       ______________________________________________________________________________________________      01/10/2023: Hemoglobin 13.0; Magnesium 2.0; Platelets 202 02/09/2023: TSH 0.716 05/07/2023: ALT 18; BUN 23;  Creatinine, Ser 1.35; Potassium 4.8; Sodium 142   STS RISK CALCULATOR: Pending  NHYA CLASS: 2    ASSESSMENT AND PLAN:   1. Nonrheumatic aortic valve stenosis   2. Typical atrial flutter (HCC)   3. Essential hypertension, benign   4. Hyperlipidemia LDL goal <70   5. Aortic atherosclerosis (HCC)   6. Stage 3a chronic kidney disease (HCC)   7. BMI 24.0-24.9, adult   8. Pre-procedure lab exam     AS: The patient has developed severe aortic stenosis.  I think he would be much more symptomatic if he were not limited by his inguinal hernia.  He does report some fatigue as well.  He is requiring inguinal hernia surgery.  For this reason we will pursue an evaluation for an aortic valve intervention.  The patient has a severe allergy to IV dye.  It seems like he was not premedicated with a 3 drug regimen in the past.  I think the thing to do is to proceed to a coronary angiography and right heart catheterization study with standard 3 drug premedication regimen.  If this goes fine and without issues then we can proceed to a CT scan.  If this is problematic then we will have to consider a surgical or aortic valve replacement in this elderly individual.  Will plan premedication with prednisone, Benadryl, and Pepcid. Atrial flutter:  LAA closure, not on A/C.  Continue amiodarone 200mg  twice a week and 100mg  all other days. HTN: Blood pressure is well-controlled today.  Continue losartan 100 mg  daily. Hyperlipidemia:  Continue simvastatin 20mg  daily. Aortic atherosclerosis:  Start ASA 81mg  and continue simvastatin 20mg  daily. CKD IIIa:  Continue losartan 100mg  for renal protection, consider SGLT2i. Elevated BMI:  Diet and exercise modification after AS has been managed.  I have personally reviewed the patients imaging data as summarized above.  I have reviewed the natural history of aortic stenosis with the patient and family members who are present today. We have discussed the limitations of medical  therapy and the poor prognosis associated with symptomatic aortic stenosis. We have also reviewed potential treatment options, including palliative medical therapy, conventional surgical aortic valve replacement, and transcatheter aortic valve replacement. We discussed treatment options in the context of this patient's specific comorbid medical conditions.   All of the patient's questions were answered today. Will make further recommendations based on the results of studies outlined above.   I spent 51 minutes reviewing all clinical data during and prior to this visit including all relevant imaging studies, laboratories, clinical information from other health systems and prior notes from both Cardiology and other specialties, interviewing the patient, conducting a complete physical examination, and coordinating care in order to formulate a comprehensive and personalized evaluation and treatment plan.   Orbie Pyo, MD  06/04/2023 10:08 AM    [facility] [address] [place], [place], [place]  [address] Phone: [phone]; Fax: [phone]

## 2023-06-04 ENCOUNTER — Ambulatory Visit: Payer: Medicare Other | Attending: Internal Medicine | Admitting: Internal Medicine

## 2023-06-04 ENCOUNTER — Other Ambulatory Visit: Payer: Self-pay | Admitting: Internal Medicine

## 2023-06-04 ENCOUNTER — Encounter: Payer: Self-pay | Admitting: Internal Medicine

## 2023-06-04 VITALS — BP 122/82 | HR 60 | Ht 67.5 in | Wt 153.0 lb

## 2023-06-04 DIAGNOSIS — I700 Atherosclerosis of aorta: Secondary | ICD-10-CM

## 2023-06-04 DIAGNOSIS — I350 Nonrheumatic aortic (valve) stenosis: Secondary | ICD-10-CM

## 2023-06-04 DIAGNOSIS — Z01812 Encounter for preprocedural laboratory examination: Secondary | ICD-10-CM | POA: Diagnosis not present

## 2023-06-04 DIAGNOSIS — E785 Hyperlipidemia, unspecified: Secondary | ICD-10-CM | POA: Diagnosis not present

## 2023-06-04 DIAGNOSIS — N1831 Chronic kidney disease, stage 3a: Secondary | ICD-10-CM

## 2023-06-04 DIAGNOSIS — Z6824 Body mass index (BMI) 24.0-24.9, adult: Secondary | ICD-10-CM

## 2023-06-04 DIAGNOSIS — I10 Essential (primary) hypertension: Secondary | ICD-10-CM | POA: Diagnosis not present

## 2023-06-04 DIAGNOSIS — I483 Typical atrial flutter: Secondary | ICD-10-CM | POA: Diagnosis not present

## 2023-06-04 LAB — CBC

## 2023-06-04 MED ORDER — DIPHENHYDRAMINE HCL 50 MG PO CAPS: ORAL_CAPSULE | ORAL | 0 refills | Status: DC

## 2023-06-04 MED ORDER — PREDNISONE 50 MG PO TABS: ORAL_TABLET | ORAL | 0 refills | Status: DC

## 2023-06-04 MED ORDER — ASPIRIN 81 MG PO TBEC: 81.0000 mg | DELAYED_RELEASE_TABLET | Freq: Every day | ORAL | Status: AC

## 2023-06-04 MED ORDER — FAMOTIDINE 20 MG PO TABS: 20.0000 mg | ORAL_TABLET | Freq: Two times a day (BID) | ORAL | 0 refills | Status: DC

## 2023-06-04 NOTE — Addendum Note (Signed)
 Addended by: Franchot Gallo on: 06/04/2023 10:26 AM   Modules accepted: Orders

## 2023-06-04 NOTE — Progress Notes (Addendum)
 Pre Surgical Assessment: 5 M Walk Test  74M=16.69ft  5 Meter Walk Test- trial 1: 6.3 seconds 5 Meter Walk Test- trial 2: 6.35 seconds 5 Meter Walk Test- trial 3: 5.95 seconds 5 Meter Walk Test Average: 6.2 seconds  ______________________  Procedure Type: Isolated AVR Perioperative Outcome Estimate % Operative Mortality 3.99% Morbidity & Mortality 11.9% Stroke 0.996% Renal Failure 2.41% Reoperation 4.4% Prolonged Ventilation 6.79% Deep Sternal Wound Infection 0.038% Long Hospital Stay (>14 days) 7.51% Short Hospital Stay (<6 days)* 28.8%

## 2023-06-04 NOTE — Patient Instructions (Signed)
 Medication Instructions:  Your physician has recommended you make the following change in your medication:   1) START aspirin 81 mg daily  *If you need a refill on your cardiac medications before your next appointment, please call your pharmacy*  Lab Work: TODAY: CBC, BMET If you have labs (blood work) drawn today and your tests are completely normal, you will receive your results only by: MyChart Message (if you have MyChart) OR A paper copy in the mail If you have any lab test that is abnormal or we need to change your treatment, we will call you to review the results.  Testing/Procedures: Your physician has requested that you have a cardiac catheterization. Cardiac catheterization is used to diagnose and/or treat various heart conditions. Doctors may recommend this procedure for a number of different reasons. The most common reason is to evaluate chest pain. Chest pain can be a symptom of coronary artery disease (CAD), and cardiac catheterization can show whether plaque is narrowing or blocking your heart's arteries. This procedure is also used to evaluate the valves, as well as measure the blood flow and oxygen levels in different parts of your heart. For further information please visit [email or web address] Please follow instruction sheet, as given.   Follow-Up: Will be scheduled after your heart catheterization.  Other Instructions       Cardiac/Peripheral Catheterization   You are scheduled for a Cardiac Catheterization on Tuesday, February 25 with Dr. Alverda Skeans.  1. Please arrive at the Ambulatory Care Center (Main Entrance A) at [facility]: [address] [place], [place] [address] at 7:00 AM (This time is 2 hour(s) before your procedure to ensure your preparation).   Free valet parking service is available. You will check in at ADMITTING. The support person will be asked to wait in the waiting room.  It is OK to have someone drop you off and come back when you are ready to  be discharged.        Special note: Every effort is made to have your procedure done on time. Please understand that emergencies sometimes delay scheduled procedures.  2. Diet: Do not eat solid foods after midnight.  You may have clear liquids until 5 AM the day of the procedure.  3. Labs: TODAY: CBC, BMET  4. Medication instructions in preparation for your procedure:   Contrast Allergy: Yes, Please take Prednisone 50mg  by mouth at: Thirteen hours prior to cath 8:00pm on Monday June 07, 2023 Seven hours prior to cath 2:00am on Tuesday June 08, 2023 And prior to leaving home please take last dose of Prednisone 50mg  and Benadryl 50mg  by mouth.    On the morning of your procedure, take Aspirin 81 mg and any morning medicines NOT listed above.  You may use sips of water.  5. Plan to go home the same day, you will only stay overnight if medically necessary. 6. You MUST have a responsible adult to drive you home. 7. An adult MUST be with you the first 24 hours after you arrive home. 8. Bring a current list of your medications, and the last time and date medication taken. 9. Bring ID and current insurance cards. 10.Please wear clothes that are easy to get on and off and wear slip-on shoes.  Thank you for allowing [place] to care for you!   -- [facility]

## 2023-06-05 ENCOUNTER — Encounter: Payer: Self-pay | Admitting: Internal Medicine

## 2023-06-05 LAB — BASIC METABOLIC PANEL
BUN/Creatinine Ratio: 19 (ref 10–24)
BUN/Creatinine Ratio: 20 (ref 10–24)
BUN: 23 mg/dL (ref 8–27)
BUN: 24 mg/dL (ref 8–27)
CO2: 21 mmol/L (ref 20–29)
CO2: 22 mmol/L (ref 20–29)
Calcium: 10 mg/dL (ref 8.6–10.2)
Calcium: 9.8 mg/dL (ref 8.6–10.2)
Chloride: 103 mmol/L (ref 96–106)
Chloride: 103 mmol/L (ref 96–106)
Creatinine, Ser: 1.19 mg/dL (ref 0.76–1.27)
Creatinine, Ser: 1.2 mg/dL (ref 0.76–1.27)
Glucose: 94 mg/dL (ref 70–99)
Glucose: 96 mg/dL (ref 70–99)
Potassium: 4.3 mmol/L (ref 3.5–5.2)
Potassium: 4.8 mmol/L (ref 3.5–5.2)
Sodium: 141 mmol/L (ref 134–144)
Sodium: 141 mmol/L (ref 134–144)
eGFR: 59 mL/min/{1.73_m2} — ABNORMAL LOW (ref >59)
eGFR: 59 mL/min/{1.73_m2} — ABNORMAL LOW (ref >59)

## 2023-06-05 LAB — CBC
Hematocrit: 42.5 % (ref 37.5–51.0)
Hematocrit: 43.7 % (ref 37.5–51.0)
Hemoglobin: 14.3 g/dL (ref 13.0–17.7)
Hemoglobin: 14.5 g/dL (ref 13.0–17.7)
MCH: 31.6 pg (ref 26.6–33.0)
MCH: 31.7 pg (ref 26.6–33.0)
MCHC: 33.2 g/dL (ref 31.5–35.7)
MCHC: 33.6 g/dL (ref 31.5–35.7)
MCV: 94 fL (ref 79–97)
MCV: 96 fL (ref 79–97)
Platelets: 223 10*3/uL (ref 150–450)
Platelets: 225 10*3/uL (ref 150–450)
RBC: 4.52 x10E6/uL (ref 4.14–5.80)
RBC: 4.57 x10E6/uL (ref 4.14–5.80)
RDW: 13 % (ref 11.6–15.4)
RDW: 13.3 % (ref 11.6–15.4)
WBC: 8.5 10*3/uL (ref 3.4–10.8)
WBC: 8.9 10*3/uL (ref 3.4–10.8)

## 2023-06-07 ENCOUNTER — Telehealth: Payer: Self-pay

## 2023-06-07 ENCOUNTER — Telehealth: Payer: Self-pay | Admitting: *Deleted

## 2023-06-07 ENCOUNTER — Encounter: Payer: Self-pay | Admitting: Internal Medicine

## 2023-06-07 NOTE — Telephone Encounter (Signed)
 Left message for patient informing him time of arrival for heart cath procedure with Dr. Lynnette Caffey tomorrow (2/25) is now 11:30 AM. Provided office number for callback if any questions.

## 2023-06-07 NOTE — Telephone Encounter (Signed)
 Cardiac Catheterization scheduled at [facility] for: June 08, 2023 1:30 PM-(time change). Arrival time [facility] Main Entrance A at: 11:30 AM  Nothing to eat after midnight prior to procedure, clear liquids until 5 AM day of procedure.  CONTRAST ALLERGY: 13 hour Prednisone and Benadryl Prep: Patient aware of time changes for prep medications since procedure time has changed. 06/08/23 Prednisone 50 mg 12:30 AM 06/08/23 Prednisone 50 mg 6:30 AM 06/08/23 Prednisone 50 mg and Benadryl 50 mg -just prior to leaving home for hospital. Patient advised not to drive after taking Benadryl.  Medication instructions: -Hold:  Losartan-day before and day of procedure -per protocol GFR < 60 (59)-already taken today -Other usual morning medications can be taken with sips of water including aspirin 81 mg.  Plan to go home the same day, you will only stay overnight if medically necessary.  You must have responsible adult to drive you home.  Someone must be with you the first 24 hours after you arrive home.  Reviewed procedure instructions/time change for procedure with patient.

## 2023-06-08 ENCOUNTER — Other Ambulatory Visit: Payer: Self-pay

## 2023-06-08 ENCOUNTER — Ambulatory Visit (HOSPITAL_COMMUNITY)
Admission: RE | Admit: 2023-06-08 | Discharge: 2023-06-08 | Disposition: A | Discharge status: Home / Self Care | Payer: Medicare Other | Attending: Internal Medicine | Admitting: Internal Medicine

## 2023-06-08 ENCOUNTER — Encounter (HOSPITAL_COMMUNITY)
Admission: RE | Disposition: A | Discharge status: Home / Self Care | Payer: Self-pay | Source: Home / Self Care | Attending: Internal Medicine

## 2023-06-08 DIAGNOSIS — K4090 Unilateral inguinal hernia, without obstruction or gangrene, not specified as recurrent: Secondary | ICD-10-CM | POA: Diagnosis not present

## 2023-06-08 DIAGNOSIS — Z79899 Other long term (current) drug therapy: Secondary | ICD-10-CM | POA: Diagnosis not present

## 2023-06-08 DIAGNOSIS — I700 Atherosclerosis of aorta: Secondary | ICD-10-CM | POA: Insufficient documentation

## 2023-06-08 DIAGNOSIS — I129 Hypertensive chronic kidney disease with stage 1 through stage 4 chronic kidney disease, or unspecified chronic kidney disease: Secondary | ICD-10-CM | POA: Insufficient documentation

## 2023-06-08 DIAGNOSIS — I2510 Atherosclerotic heart disease of native coronary artery without angina pectoris: Secondary | ICD-10-CM

## 2023-06-08 DIAGNOSIS — I483 Typical atrial flutter: Secondary | ICD-10-CM | POA: Diagnosis not present

## 2023-06-08 DIAGNOSIS — E785 Hyperlipidemia, unspecified: Secondary | ICD-10-CM | POA: Insufficient documentation

## 2023-06-08 DIAGNOSIS — N1831 Chronic kidney disease, stage 3a: Secondary | ICD-10-CM | POA: Diagnosis not present

## 2023-06-08 DIAGNOSIS — Z91041 Radiographic dye allergy status: Secondary | ICD-10-CM | POA: Diagnosis not present

## 2023-06-08 DIAGNOSIS — I350 Nonrheumatic aortic (valve) stenosis: Secondary | ICD-10-CM | POA: Diagnosis not present

## 2023-06-08 HISTORY — PX: RIGHT/LEFT HEART CATH AND CORONARY ANGIOGRAPHY: CATH118266

## 2023-06-08 HISTORY — PX: ABDOMINAL AORTOGRAM: CATH118222

## 2023-06-08 LAB — POCT I-STAT EG7
Acid-base deficit: 3 mmol/L — ABNORMAL HIGH (ref 0.0–2.0)
Acid-base deficit: 3 mmol/L — ABNORMAL HIGH (ref 0.0–2.0)
Bicarbonate: 21.7 mmol/L (ref 20.0–28.0)
Bicarbonate: 22 mmol/L (ref 20.0–28.0)
Calcium, Ion: 1.15 mmol/L (ref 1.15–1.40)
Calcium, Ion: 1.19 mmol/L (ref 1.15–1.40)
HCT: 37 % — ABNORMAL LOW (ref 39.0–52.0)
HCT: 38 % — ABNORMAL LOW (ref 39.0–52.0)
Hemoglobin: 12.6 g/dL — ABNORMAL LOW (ref 13.0–17.0)
Hemoglobin: 12.9 g/dL — ABNORMAL LOW (ref 13.0–17.0)
O2 Saturation: 74 %
O2 Saturation: 78 %
Potassium: 3.6 mmol/L (ref 3.5–5.1)
Potassium: 3.7 mmol/L (ref 3.5–5.1)
Sodium: 140 mmol/L (ref 135–145)
Sodium: 140 mmol/L (ref 135–145)
TCO2: 23 mmol/L (ref 22–32)
TCO2: 23 mmol/L (ref 22–32)
pCO2, Ven: 36.8 mmHg — ABNORMAL LOW (ref 44–60)
pCO2, Ven: 38 mmHg — ABNORMAL LOW (ref 44–60)
pH, Ven: 7.37 (ref 7.25–7.43)
pH, Ven: 7.379 (ref 7.25–7.43)
pO2, Ven: 40 mmHg (ref 32–45)
pO2, Ven: 43 mmHg (ref 32–45)

## 2023-06-08 LAB — POCT I-STAT 7, (LYTES, BLD GAS, ICA,H+H)
Acid-base deficit: 4 mmol/L — ABNORMAL HIGH (ref 0.0–2.0)
Bicarbonate: 20.5 mmol/L (ref 20.0–28.0)
Calcium, Ion: 1.2 mmol/L (ref 1.15–1.40)
HCT: 39 % (ref 39.0–52.0)
Hemoglobin: 13.3 g/dL (ref 13.0–17.0)
O2 Saturation: 95 %
Potassium: 3.8 mmol/L (ref 3.5–5.1)
Sodium: 138 mmol/L (ref 135–145)
TCO2: 22 mmol/L (ref 22–32)
pCO2 arterial: 33.4 mmHg (ref 32–48)
pH, Arterial: 7.396 (ref 7.35–7.45)
pO2, Arterial: 77 mmHg — ABNORMAL LOW (ref 83–108)

## 2023-06-08 SURGERY — RIGHT/LEFT HEART CATH AND CORONARY ANGIOGRAPHY
Anesthesia: LOCAL

## 2023-06-08 MED ORDER — HYDRALAZINE HCL 20 MG/ML IJ SOLN: 10.0000 mg | INTRAMUSCULAR | Status: DC | PRN

## 2023-06-08 MED ORDER — FENTANYL CITRATE (PF) 100 MCG/2ML IJ SOLN
INTRAMUSCULAR | Status: AC
  Filled 2023-06-08: qty 2

## 2023-06-08 MED ORDER — HEPARIN SODIUM (PORCINE) 1000 UNIT/ML IJ SOLN
INTRAMUSCULAR | Status: DC | PRN
  Administered 2023-06-08: 5000 [IU] via INTRAVENOUS

## 2023-06-08 MED ORDER — SODIUM CHLORIDE 0.9 % WEIGHT BASED INFUSION
3.0000 mL/kg/h | INTRAVENOUS | Status: AC
  Administered 2023-06-08: 3 mL/kg/h via INTRAVENOUS

## 2023-06-08 MED ORDER — MIDAZOLAM HCL 2 MG/2ML IJ SOLN
INTRAMUSCULAR | Status: DC | PRN
  Administered 2023-06-08: 1 mg via INTRAVENOUS

## 2023-06-08 MED ORDER — SODIUM CHLORIDE 0.9 % IV SOLN: INTRAVENOUS | Status: DC

## 2023-06-08 MED ORDER — ASPIRIN 81 MG PO CHEW: 81.0000 mg | CHEWABLE_TABLET | ORAL | Status: DC

## 2023-06-08 MED ORDER — HEPARIN (PORCINE) IN NACL 2000-0.9 UNIT/L-% IV SOLN
INTRAVENOUS | Status: DC | PRN
  Administered 2023-06-08: 1000 mL

## 2023-06-08 MED ORDER — SODIUM CHLORIDE 0.9 % WEIGHT BASED INFUSION: 1.0000 mL/kg/h | INTRAVENOUS | Status: DC

## 2023-06-08 MED ORDER — SODIUM CHLORIDE 0.9 % IV SOLN: 250.0000 mL | INTRAVENOUS | Status: DC | PRN

## 2023-06-08 MED ORDER — FENTANYL CITRATE (PF) 100 MCG/2ML IJ SOLN
INTRAMUSCULAR | Status: DC | PRN
  Administered 2023-06-08: 25 ug via INTRAVENOUS

## 2023-06-08 MED ORDER — MIDAZOLAM HCL 2 MG/2ML IJ SOLN
INTRAMUSCULAR | Status: AC
  Filled 2023-06-08: qty 2

## 2023-06-08 MED ORDER — ACETAMINOPHEN 325 MG PO TABS: 650.0000 mg | ORAL_TABLET | ORAL | Status: DC | PRN

## 2023-06-08 MED ORDER — SODIUM CHLORIDE 0.9% FLUSH: 3.0000 mL | Freq: Two times a day (BID) | INTRAVENOUS | Status: DC

## 2023-06-08 MED ORDER — IOHEXOL 350 MG/ML SOLN
INTRAVENOUS | Status: DC | PRN
  Administered 2023-06-08: 60 mL

## 2023-06-08 MED ORDER — LIDOCAINE HCL (PF) 1 % IJ SOLN
INTRAMUSCULAR | Status: DC | PRN
  Administered 2023-06-08: 5 mL

## 2023-06-08 MED ORDER — VERAPAMIL HCL 2.5 MG/ML IV SOLN
INTRAVENOUS | Status: DC | PRN
  Administered 2023-06-08: 10 mL via INTRA_ARTERIAL

## 2023-06-08 MED ORDER — HEPARIN (PORCINE) IN NACL 1000-0.9 UT/500ML-% IV SOLN
INTRAVENOUS | Status: DC | PRN
  Administered 2023-06-08: 500 mL

## 2023-06-08 MED ORDER — LIDOCAINE HCL (PF) 1 % IJ SOLN
INTRAMUSCULAR | Status: AC
  Filled 2023-06-08: qty 30

## 2023-06-08 MED ORDER — ONDANSETRON HCL 4 MG/2ML IJ SOLN: 4.0000 mg | Freq: Four times a day (QID) | INTRAMUSCULAR | Status: DC | PRN

## 2023-06-08 MED ORDER — VERAPAMIL HCL 2.5 MG/ML IV SOLN
INTRAVENOUS | Status: AC
  Filled 2023-06-08: qty 2

## 2023-06-08 MED ORDER — SODIUM CHLORIDE 0.9% FLUSH: 3.0000 mL | INTRAVENOUS | Status: DC | PRN

## 2023-06-08 SURGICAL SUPPLY — 13 items
CATH BALLN WEDGE 5F 110CM (CATHETERS) IMPLANT
CATH INFINITI 5FR ANG PIGTAIL (CATHETERS) IMPLANT
CATH INFINITI AMBI 6FR TG (CATHETERS) IMPLANT
DEVICE RAD COMP TR BAND LRG (VASCULAR PRODUCTS) IMPLANT
GLIDESHEATH SLEND SS 6F .021 (SHEATH) IMPLANT
GUIDEWIRE .025 260CM (WIRE) IMPLANT
KIT SINGLE USE MANIFOLD (KITS) IMPLANT
KIT SYRINGE INJ CVI SPIKEX1 (MISCELLANEOUS) IMPLANT
PACK CARDIAC CATHETERIZATION (CUSTOM PROCEDURE TRAY) ×1 IMPLANT
SET ATX-X65L (MISCELLANEOUS) IMPLANT
SHEATH GLIDE SLENDER 4/5FR (SHEATH) IMPLANT
SHEATH PROBE COVER 6X72 (BAG) IMPLANT
WIRE EMERALD 3MM-J .035X260CM (WIRE) IMPLANT

## 2023-06-08 NOTE — Discharge Instructions (Signed)

## 2023-06-08 NOTE — Progress Notes (Signed)
 Patient and wife was given discharge instructions. Both verbalized understanding.

## 2023-06-08 NOTE — Interval H&P Note (Signed)
 History and Physical Interval Note:  06/08/2023 12:47 PM  Kyle [name].  has presented today for surgery, with the diagnosis of AS.  The various methods of treatment have been discussed with the patient and family. After consideration of risks, benefits and other options for treatment, the patient has consented to  Procedure(s): RIGHT/LEFT HEART CATH AND CORONARY ANGIOGRAPHY (N/A) as a surgical intervention.  The patient's history has been reviewed, patient examined, no change in status, stable for surgery.  I have reviewed the patient's chart and labs.  Questions were answered to the patient's satisfaction.     Orbie Pyo

## 2023-06-09 ENCOUNTER — Encounter (HOSPITAL_COMMUNITY): Payer: Self-pay | Admitting: Internal Medicine

## 2023-06-10 ENCOUNTER — Encounter: Payer: Self-pay | Admitting: Physician Assistant

## 2023-06-10 ENCOUNTER — Other Ambulatory Visit: Payer: Self-pay | Admitting: Physician Assistant

## 2023-06-10 DIAGNOSIS — I350 Nonrheumatic aortic (valve) stenosis: Secondary | ICD-10-CM

## 2023-06-10 MED ORDER — FAMOTIDINE 20 MG PO TABS: 20.0000 mg | ORAL_TABLET | ORAL | 0 refills | Status: DC

## 2023-06-10 MED ORDER — PREDNISONE 50 MG PO TABS: ORAL_TABLET | ORAL | 0 refills | Status: DC

## 2023-06-11 ENCOUNTER — Telehealth: Payer: Self-pay | Admitting: Physician Assistant

## 2023-06-11 MED ORDER — PREDNISONE 10 MG (21) PO TBPK: ORAL_TABLET | ORAL | 1 refills | Status: AC

## 2023-06-11 MED ORDER — TRIAMCINOLONE ACETONIDE 0.025 % EX OINT: 1.0000 | TOPICAL_OINTMENT | Freq: Every day | CUTANEOUS | 0 refills | Status: DC | PRN

## 2023-06-11 NOTE — Telephone Encounter (Signed)
  [facility]   MULTIDISCIPLINARY HEART VALVE TEAM  Pt recently underwent cardiac cath for pre TAVR work up. He has a known severe contrast dye allergy and was premedicated with prednisone, Benadryl and Pepcid. Despite this he still has developed a pruritic rash which started on 2/27. He denies and difficulty breathing or oropharyngeal swelling. He said he has been treated with steroid cream in the past with relief. He is also feeling weak and shaky.   Plan: Will call in a steroid taper and steroid ointment. He can take OTC Bendryl as needed. We went over ER precautions. If he has worsening weakness and shakes, he will call primary care or be seen in the ER.    Cline Crock PA-C  [facility]

## 2023-06-14 NOTE — Telephone Encounter (Signed)
 Pt calling back about his contrast reaction. He continues to feel badly with weakness and shakiness but improving very slowly. He still has a rash and taking prednisone, benadryl and steroid ointment. I wonder if we need to treat him with IV steroids and benadryl either before or after his upcoming CT scans.   Cline Crock PA-C  [facility]

## 2023-07-02 ENCOUNTER — Telehealth (HOSPITAL_COMMUNITY): Payer: Self-pay | Admitting: *Deleted

## 2023-07-02 NOTE — Telephone Encounter (Signed)
 Reaching out to patient to offer assistance regarding upcoming cardiac imaging study; pt verbalizes understanding of appt date/time, parking situation and where to check in, pre-test NPO status and medications ordered; name and call back number provided for further questions should they arise  Kyle Brick RN Navigator Cardiac Imaging [facility] [phone] office [phone] cell  Patient verbalized understanding of how to take 13 hour prep.

## 2023-07-05 ENCOUNTER — Ambulatory Visit (HOSPITAL_COMMUNITY)
Admission: RE | Admit: 2023-07-05 | Discharge: 2023-07-05 | Disposition: A | Discharge status: Home / Self Care | Payer: Medicare Other | Source: Ambulatory Visit | Attending: Physician Assistant | Admitting: Physician Assistant

## 2023-07-05 DIAGNOSIS — I350 Nonrheumatic aortic (valve) stenosis: Secondary | ICD-10-CM

## 2023-07-05 DIAGNOSIS — I701 Atherosclerosis of renal artery: Secondary | ICD-10-CM | POA: Diagnosis not present

## 2023-07-05 DIAGNOSIS — Z01810 Encounter for preprocedural cardiovascular examination: Secondary | ICD-10-CM | POA: Diagnosis not present

## 2023-07-05 DIAGNOSIS — K5730 Diverticulosis of large intestine without perforation or abscess without bleeding: Secondary | ICD-10-CM | POA: Diagnosis not present

## 2023-07-05 MED ORDER — IOHEXOL 350 MG/ML SOLN
95.0000 mL | Freq: Once | INTRAVENOUS | Status: AC | PRN
  Administered 2023-07-05: 95 mL via INTRAVENOUS

## 2023-07-05 MED ORDER — METHYLPREDNISOLONE SODIUM SUCC 125 MG IJ SOLR
40.0000 mg | Freq: Once | INTRAMUSCULAR | Status: AC
  Administered 2023-07-05: 40 mg via INTRAVENOUS

## 2023-07-05 MED ORDER — METHYLPREDNISOLONE SODIUM SUCC 125 MG IJ SOLR
INTRAMUSCULAR | Status: AC
Start: 2023-07-05 — End: ?
  Filled 2023-07-05: qty 2

## 2023-07-05 MED ORDER — IOHEXOL 350 MG/ML SOLN: 95.0000 mL | Freq: Once | INTRAVENOUS | Status: DC | PRN

## 2023-07-05 NOTE — Progress Notes (Signed)
 Discussed Carlean Jews, PA regarding patient's contrast allergy. Patient only developed a rash two days after IV contrast injection for his heart cath. Discussed with [facility] CT supervisor, they would not consider this a breakthrough allergy after prep and are ok to proceed with today's planned CT scan.  IV solumedrol placed with verbal order from [place], [place].

## 2023-07-12 ENCOUNTER — Telehealth: Payer: Self-pay | Admitting: Student in an Organized Health Care Education/Training Program

## 2023-07-12 NOTE — Telephone Encounter (Signed)
 I was notified by [facility] that the patient has an incidentally found 6 x 3 mm ureteral stone without associated hydronephrosis in the L kidney on a CT performed early today prior to my shift. I messaged the ordering provider to inform her of this finding.

## 2023-07-19 DIAGNOSIS — R112 Nausea with vomiting, unspecified: Secondary | ICD-10-CM | POA: Diagnosis not present

## 2023-07-19 DIAGNOSIS — N201 Calculus of ureter: Secondary | ICD-10-CM | POA: Diagnosis not present

## 2023-07-21 ENCOUNTER — Institutional Professional Consult (permissible substitution): Payer: Medicare Other | Admitting: Surgery

## 2023-07-21 ENCOUNTER — Encounter: Payer: Self-pay | Admitting: Surgery

## 2023-07-21 VITALS — BP 124/60 | HR 71 | Resp 18 | Ht 67.5 in | Wt 150.0 lb

## 2023-07-21 DIAGNOSIS — I350 Nonrheumatic aortic (valve) stenosis: Secondary | ICD-10-CM | POA: Diagnosis not present

## 2023-07-21 NOTE — Progress Notes (Unsigned)
 Patient ID: Kyle Hook., male   DOB: 09-13-1934, 89 y.o.   MRN: [id number]  [facility]       [address]       [place] [address]             [phone]          CARDIOTHORACIC SURGERY CONSULTATION REPORT  PCP is Elfredia Nevins, MD Referring Provider is Alverda Skeans, MD Primary Cardiologist is Dina Rich, MD  Reason for consultation: Severe aortic stenosis  HPI:  The patient is an 89 year old gentleman with history of hypertension, dyslipidemia, stage III chronic kidney disease, paroxysmal atrial flutter status post multiple cardioversions and status post thoracoscopic closure of the left atrial appendage in January 2024 by Dr. Delia Chimes, kidney stones, prostate cancer, contrast dye allergy, and severe aortic stenosis followed by Dr. Wyline Mood who is referred for consideration of TAVR.  He has an enlarging left-sided hernia over the past 6 months that is causing significant discomfort especially when he walks and will require surgical treatment.  His most recent echo on 04/19/2023 shows a severely calcified and thickened aortic valve with restricted leaflet mobility.  The mean gradient was 32.5 mmHg with a peak of 55 mmHg and a valve area by VTI of 0.74 cm.  Left ventricular ejection fraction is 60 to 65% with moderate concentric LVH and grade 2 diastolic dysfunction.  He underwent an exercise tolerance test since he said that he felt well overall and denied any significant symptoms.  This showed some evidence of exercise limitation with baseline diffuse mild ST depression that was slightly more prominent in recovery.  He subsequently underwent cardiac catheterization showing minimal nonobstructive coronary disease.  Right heart catheterization was fairly unremarkable.  The patient is here today with his daughter.  He lives at home with his wife.  He denies any shortness of breath or fatigue but is not that  active.  His main complaint is of tiredness and weakness in his legs with exertion.  His activity is limited due to arthritis.  He denies any dizziness or syncope.  He has had some peripheral edema particularly in the right lower leg which is the side where he had a DVT in the past.    Past Medical History:  Diagnosis Date   Arthritis    Atrial fibrillation (HCC)    Auto immune neutropenia (HCC)    Bullous pemphigoid    Cancer (HCC)    Prostate   Cervical spondylosis without myelopathy 01/22/2016   Chronic kidney disease    Per MD note 05/2021 CKD stage non specified   Complication of anesthesia    difficult to wake up after anesthesia   DVT (deep venous thrombosis) (HCC)    a. Has had a previous right lower extremity DVT in the setting of hospitalization and surgery (after his brain tumor was resected in 1993) but is no longer on Coumadin.   Dyslipidemia    Dysrhythmia    History of kidney stones    History of prostate cancer    Hypertension    Meningioma (HCC)    a. s/p resection 1990s.   Myasthenia (HCC) 02/14/2013   Nocturnal leg cramps    Ocular myasthenia gravis (HCC)    Peripheral neuropathy 11/23/2018   Seizures (HCC)    Sinus bradycardia    Sleep paralysis    Thyroid nodule, cold     Past Surgical History:  Procedure Laterality Date  ABDOMINAL AORTOGRAM N/A 06/08/2023   Procedure: ABDOMINAL AORTOGRAM;  Surgeon: Orbie Pyo, MD;  Location: [facility] INVASIVE CV LAB;  Service: Cardiovascular;  Laterality: N/A;   BLADDER SURGERY     removed part of the bladder   BRAIN SURGERY     CARDIOVERSION N/A 11/07/2020   Procedure: CARDIOVERSION;  Surgeon: Antoine Poche, MD;  Location: [facility] ORS;  Service: Endoscopy;  Laterality: N/A;   CARDIOVERSION N/A 11/20/2021   Procedure: CARDIOVERSION;  Surgeon: Antoine Poche, MD;  Location: [facility] ORS;  Service: Endoscopy;  Laterality: N/A;   CHOLECYSTECTOMY     CLIPPING OF ATRIAL APPENDAGE N/A 04/20/2022   Procedure: CLIPPING OF  ATRIAL APPENDAGE USING 45 ATRIAL CLIP;  Surgeon: Lyn Hollingshead, MD;  Location: [facility] OR;  Service: Open Heart Surgery;  Laterality: N/A;   COLONOSCOPY N/A 07/25/2013   Procedure: COLONOSCOPY;  Surgeon: Dalia Heading, MD;  Location: [facility] ENDO SUITE;  Service: Gastroenterology;  Laterality: N/A;   COLONOSCOPY N/A 11/10/2016   Procedure: COLONOSCOPY;  Surgeon: Franky Macho, MD;  Location: [facility];  Service: Gastroenterology;  Laterality: N/A;   IR CATHETER TUBE CHANGE  01/02/2017   IR RADIOLOGIST EVAL & MGMT  01/06/2017   IR RADIOLOGIST EVAL & MGMT  01/20/2017   IR RADIOLOGIST EVAL & MGMT  02/03/2017   IR RADIOLOGIST EVAL & MGMT  02/17/2017   IR SINUS/FIST TUBE CHK-NON GI  03/03/2017   PARTIAL COLECTOMY     RADIOACTIVE SEED IMPLANT     RIGHT/LEFT HEART CATH AND CORONARY ANGIOGRAPHY N/A 06/08/2023   Procedure: RIGHT/LEFT HEART CATH AND CORONARY ANGIOGRAPHY;  Surgeon: Orbie Pyo, MD;  Location: [facility] INVASIVE CV LAB;  Service: Cardiovascular;  Laterality: N/A;   TEE WITHOUT CARDIOVERSION N/A 04/20/2022   Procedure: TRANSESOPHAGEAL ECHOCARDIOGRAM (TEE);  Surgeon: Lyn Hollingshead, MD;  Location: [facility] OR;  Service: Open Heart Surgery;  Laterality: N/A;   VIDEO ASSISTED THORACOSCOPY Left 04/20/2022   Procedure: VIDEO ASSISTED THORACOSCOPY;  Surgeon: Lyn Hollingshead, MD;  Location: [facility] OR;  Service: Thoracic;  Laterality: Left;    Family History  Problem Relation Age of Onset   Heart failure Mother    Diabetes Mother    Diabetes Sister    Dementia Sister    Lung cancer Son    Heart failure Daughter    Colon cancer Neg Hx     Social History   Socioeconomic History   Marital status: Married    Spouse name: Diplomatic Services operational officer   Number of children: 1   Years of education: 12 TH   Highest education level: Not on file  Occupational History   Occupation: retired  Tobacco Use   Smoking status: Never    Passive exposure: Never   Smokeless tobacco: Never  Vaping Use   Vaping status: Never Used   Substance and Sexual Activity   Alcohol use: No   Drug use: No   Sexual activity: Not on file  Other Topics Concern   Not on file  Social History Narrative   Patient is right handed.   Patient drinks 1 cup of coffee daily.   Social Drivers of Corporate investment banker Strain: Low Risk  (12/15/2022)   Overall Financial Resource Strain (CARDIA)    Difficulty of Paying Living Expenses: Not hard at all  Food Insecurity: Low Risk  (07/19/2023)   Received from [facility]   Hunger Vital Sign    Worried About Running Out of Food in the Last Year: Never true  Ran Out of Food in the Last Year: Never true  Transportation Needs: No Transportation Needs (07/19/2023)   Received from [facility]    In the past 12 months, has lack of reliable transportation kept you from medical appointments, meetings, work or from getting things needed for daily living? : No  Physical Activity: Sufficiently Active (11/09/2022)   Exercise Vital Sign    Days of Exercise per Week: 7 days    Minutes of Exercise per Session: 30 min  Stress: Not on file  Social Connections: Not on file  Intimate Partner Violence: Not At Risk (06/09/2022)   Humiliation, Afraid, Rape, and Kick questionnaire    Fear of Current or Ex-Partner: No    Emotionally Abused: No    Physically Abused: No    Sexually Abused: No    Prior to Admission medications   Medication Sig Start Date End Date Taking? Authorizing Provider  amiodarone (PACERONE) 200 MG tablet Take 200 mg Daily on Saturday, Sunday Take 100 mg Daily Monday -Friday 11/12/22  Yes Marinus Maw, MD  cholecalciferol (VITAMIN D3) 25 MCG (1000 UNIT) tablet Take 2,000 Units by mouth 2 (two) times daily.   Yes [provider]  clobetasol (TEMOVATE) 0.05 % external solution Apply 1 Application topically 2 (two) times daily as needed (blisters).   Yes [provider]  diphenhydrAMINE (BENADRYL) 50 MG capsule Take 1 capsule with last dose of  prednisone when leaving to come to hospital for heart cath on 06/08/23 06/04/23  Yes Orbie Pyo, MD  famotidine (PEPCID) 20 MG tablet Take 1 tablet (20 mg total) by mouth as directed. Take 1 tablet (20 mg total) by mouth 2 (two) times daily. Take the day before your CTs and the day after your CTs. 06/10/23 06/09/24 Yes Janetta Hora, PA-C  fish oil-omega-3 fatty acids 1000 MG capsule Take 2 g by mouth 2 (two) times daily.   Yes [provider]  Flaxseed, Linseed, (FLAXSEED OIL) 1200 MG CAPS Take 1,200 mg by mouth daily.   Yes [provider]  gabapentin (NEURONTIN) 600 MG tablet Take 1 tablet (600 mg total) by mouth daily. Patient taking differently: Take 600 mg by mouth at bedtime. 12/28/22  Yes Levert Feinstein, MD  Garlic (GARLIQUE PO) Take 1 tablet by mouth daily.   Yes [provider]  HYDROcodone-acetaminophen (NORCO/VICODIN) 5-325 MG tablet Take 1 tablet by mouth 2 (two) times daily. 11/06/21  Yes [provider]  levETIRAcetam (KEPPRA) 500 MG tablet Take 1/2 tablet in the morning, take 1 tablet in the evening 12/28/22  Yes Levert Feinstein, MD  losartan (COZAAR) 100 MG tablet Take 1 tablet (100 mg total) by mouth daily. 05/28/23 08/26/23 Yes BranchDorothe Pea, MD  MAGNESIUM CITRATE PO Take 400 mg by mouth at bedtime. Chew   Yes [provider]  niacinamide 500 MG tablet Take 500 mg by mouth 3 (three) times daily.   Yes [provider]  Polyethyl Glycol-Propyl Glycol 0.4-0.3 % SOLN Place 1 drop into both eyes 3 (three) times daily.   Yes [provider]  predniSONE (DELTASONE) 50 MG tablet Take 1 tablet 13 hours prior to CTs, then take 1 tablet 7 hours prior to CTs and take 1 tablet 1 hour prior to CTs. 06/10/23  Yes Janetta Hora, PA-C  pyridostigmine (MESTINON) 60 MG tablet Take 1 tablet (60 mg total) by mouth 3 (three) times daily as needed. Patient taking differently: Take 60 mg by mouth 4 (four)  times daily. 12/28/22  Yes Levert Feinstein, MD  simvastatin (ZOCOR) 20 MG tablet Take 10 mg by mouth at bedtime. 10/24/21  Yes [provider]  tiZANidine (ZANAFLEX) 4 MG tablet Take 4 mg by mouth at bedtime. 07/19/20  Yes [provider]  triamcinolone (KENALOG) 0.025 % ointment Apply 1 Application topically daily as needed. 06/11/23  Yes Janetta Hora, PA-C  aspirin EC 81 MG tablet Take 1 tablet (81 mg total) by mouth daily. Swallow whole. Patient not taking: Reported on 07/21/2023 06/04/23   Orbie Pyo, MD    Current Outpatient Medications  Medication Sig Dispense Refill   amiodarone (PACERONE) 200 MG tablet Take 200 mg Daily on Saturday, Sunday Take 100 mg Daily Monday -Friday 90 tablet 3   cholecalciferol (VITAMIN D3) 25 MCG (1000 UNIT) tablet Take 2,000 Units by mouth 2 (two) times daily.     clobetasol (TEMOVATE) 0.05 % external solution Apply 1 Application topically 2 (two) times daily as needed (blisters).     diphenhydrAMINE (BENADRYL) 50 MG capsule Take 1 capsule with last dose of prednisone when leaving to come to hospital for heart cath on 06/08/23 1 capsule 0   famotidine (PEPCID) 20 MG tablet Take 1 tablet (20 mg total) by mouth as directed. Take 1 tablet (20 mg total) by mouth 2 (two) times daily. Take the day before your CTs and the day after your CTs. 4 tablet 0   fish oil-omega-3 fatty acids 1000 MG capsule Take 2 g by mouth 2 (two) times daily.     Flaxseed, Linseed, (FLAXSEED OIL) 1200 MG CAPS Take 1,200 mg by mouth daily.     gabapentin (NEURONTIN) 600 MG tablet Take 1 tablet (600 mg total) by mouth daily. (Patient taking differently: Take 600 mg by mouth at bedtime.) 90 tablet 3   Garlic (GARLIQUE PO) Take 1 tablet by mouth daily.     HYDROcodone-acetaminophen (NORCO/VICODIN) 5-325 MG tablet Take 1 tablet by mouth 2 (two) times daily.     levETIRAcetam (KEPPRA) 500 MG tablet Take 1/2 tablet in the morning, take 1 tablet in the evening 135 tablet 3   losartan (COZAAR) 100 MG tablet Take 1  tablet (100 mg total) by mouth daily. 90 tablet 3   MAGNESIUM CITRATE PO Take 400 mg by mouth at bedtime. Chew     niacinamide 500 MG tablet Take 500 mg by mouth 3 (three) times daily.     Polyethyl Glycol-Propyl Glycol 0.4-0.3 % SOLN Place 1 drop into both eyes 3 (three) times daily.     predniSONE (DELTASONE) 50 MG tablet Take 1 tablet 13 hours prior to CTs, then take 1 tablet 7 hours prior to CTs and take 1 tablet 1 hour prior to CTs. 3 tablet 0   pyridostigmine (MESTINON) 60 MG tablet Take 1 tablet (60 mg total) by mouth 3 (three) times daily as needed. (Patient taking differently: Take 60 mg by mouth 4 (four) times daily.) 270 tablet 3   simvastatin (ZOCOR) 20 MG tablet Take 10 mg by mouth at bedtime.     tiZANidine (ZANAFLEX) 4 MG tablet Take 4 mg by mouth at bedtime.     triamcinolone (KENALOG) 0.025 % ointment Apply 1 Application topically daily as needed. 30 g 0   aspirin EC 81 MG tablet Take 1 tablet (81 mg total) by mouth daily. Swallow whole. (Patient not taking: Reported on 07/21/2023)     No current facility-administered medications for this visit.    Allergies  Allergen Reactions  Iodinated Contrast Media Hives     pt was premedicated/hives reaction occurred 24 hours post injection, Onset Date: [id number]    Sulfa Antibiotics Hives, Swelling and Rash   Bactericin [Bacitracin] Other (See Comments)    Unknown reaction    Norvasc [Amlodipine] Swelling    Leg swelling      Review of Systems:   General:  normal appetite, normal energy, no weight gain, no weight loss, no fever  Cardiac:  no chest pain with exertion, no chest pain at rest, no SOB with exertion, no resting SOB, no PND, no orthopnea, no palpitations, no arrhythmia, + atrial flutter, + LE edema, no dizzy spells, no syncope  Respiratory:  no shortness of breath, no home oxygen, no productive cough, no dry cough, no bronchitis, no wheezing, no hemoptysis, no asthma, no pain with inspiration or cough, no sleep apnea,  no CPAP at night  GI:   no difficulty swallowing, no reflux, no frequent heartburn, + hiatal hernia, no abdominal pain, no constipation, no diarrhea, no hematochezia, no hematemesis, no melena  GU:   no dysuria,  no frequency, no urinary tract infection, no hematuria, no enlarged prostate, + kidney stones, + chronic  kidney disease  Vascular:  + pain suggestive of claudication, no pain in feet, no leg cramps, no varicose veins, + DVT, no non-healing foot ulcer  Neuro:   no stroke, no TIA's, + seizures, no headaches, no temporary blindness one eye,  no slurred speech, no peripheral neuropathy, no chronic pain, + instability of gait, no memory/cognitive dysfunction  Musculoskeletal: + arthritis, no joint swelling, no myalgias, + difficulty walking, + decreased mobility   Skin:   no rash, no itching, no skin infections, no pressure sores or ulcerations  Psych:   no anxiety, no depression, no nervousness, no unusual recent stress  Eyes:   no blurry vision, no floaters, no recent vision changes, no  glasses or contacts  ENT:   + hearing loss, no loose or painful teeth, no dentures, no dental issues  Hematologic:  + easy bruising, no abnormal bleeding, no clotting disorder, no frequent epistaxis  Endocrine:  no diabetes, does not check CBG's at home     Physical Exam:   BP 124/60   Pulse 71   Resp 18   Ht 5' 7.5" (1.715 m)   Wt 150 lb (68 kg)   SpO2 95% Comment: RA  BMI 23.15 kg/m   General:  Elderly,  frail-appearing  HEENT:  Unremarkable, NCAT, PERLA, EOMI  Neck:   no JVD, no bruits, no adenopathy   Chest:   clear to auscultation, symmetrical breath sounds, no wheezes, no rhonchi   CV:   RRR, 3/6 systolic murmur RSB, no diastolic murmur  Abdomen:  soft, non-tender, hernia right lower abdomen extending into groin medially.  Extremities:  warm, well-perfused, pedal pulses not palpable, mild lower extremity edema  Rectal/GU  Deferred  Neuro:   Grossly non-focal and symmetrical  throughout  Skin:   Clean and dry, no rashes, no breakdown  Diagnostic Tests:  ECHOCARDIOGRAM REPORT       Patient Name:   Jeramiah Mccaughey. Date of Exam: 04/19/2023  Medical Rec #:  [id number]           Height:       67.0 in  Accession #:    [id number]          Weight:       154.6 lb  Date of Birth:  01-Feb-1935  BSA:          1.813 m  Patient Age:    88 years            BP:           140/70 mmHg  Patient Gender: M                   HR:           72 bpm.  Exam Location:  [place]   Procedure: 2D Echo, Cardiac Doppler and Color Doppler   Indications:    I35.0 Nonrheumatic aortic (valve) stenosis    History:        Patient has prior history of Echocardiogram examinations,  most                 recent 11/10/2022. Aortic Valve Disease, Arrythmias:Atrial                  Fibrillation and Atrial Flutter; Risk  Factors:Hypertension,                 Dyslipidemia and Non-Smoker.    Sonographer:    Dominica Severin RCS, RVS  Referring Phys: [id number] Dorothe Pea BRANCH     Sonographer Comments: Global longitudinal strain was attempted.  IMPRESSIONS     1. Left ventricular ejection fraction, by estimation, is 60 to 65%. The  left ventricle has normal function. The left ventricle has no regional  wall motion abnormalities. There is moderate concentric left ventricular  hypertrophy. Left ventricular  diastolic parameters are consistent with Grade II diastolic dysfunction  (pseudonormalization).   2. Right ventricular systolic function is normal. The right ventricular  size is normal. There is mildly elevated pulmonary artery systolic  pressure. The estimated right ventricular systolic pressure is 44.2 mmHg.   3. Left atrial size was severely dilated.   4. The mitral valve is degenerative. Mild mitral valve regurgitation.   5. Tricuspid valve regurgitation is moderate.   6. The aortic valve is tricuspid. There is severe calcifcation of the  aortic valve. Aortic valve regurgitation is  trivial. Severe aortic valve  stenosis. Aortic valve mean gradient measures 32.5 mmHg. Dimentionless  index 0.24.   7. The inferior vena cava is normal in size with greater than 50%  respiratory variability, suggesting right atrial pressure of 3 mmHg.   Comparison(s): Prior images reviewed side by side. LVEF normal range at  60-65%. Aortic stenosis severe as before with stable mean AV gradient 32  mmHg.   FINDINGS   Left Ventricle: Left ventricular ejection fraction, by estimation, is 60  to 65%. The left ventricle has normal function. The left ventricle has no  regional wall motion abnormalities. Global longitudinal strain performed  but not reported based on  interpreter judgement due to suboptimal tracking. The left ventricular  internal cavity size was normal in size. There is moderate concentric left  ventricular hypertrophy. Left ventricular diastolic parameters are  consistent with Grade II diastolic  dysfunction (pseudonormalization).   Right Ventricle: The right ventricular size is normal. No increase in  right ventricular wall thickness. Right ventricular systolic function is  normal. There is mildly elevated pulmonary artery systolic pressure. The  tricuspid regurgitant velocity is 3.21   m/s, and with an assumed right atrial pressure of 3 mmHg, the estimated  right ventricular systolic pressure is 44.2 mmHg.   Left Atrium: Left atrial size was severely dilated.   Right Atrium: Right atrial size was normal in size.  Pericardium: Trivial pericardial effusion is present. The pericardial  effusion is posterior to the left ventricle.   Mitral Valve: The mitral valve is degenerative in appearance. There is  mild thickening of the mitral valve leaflet(s). Mild mitral annular  calcification. Mild mitral valve regurgitation. MV peak gradient, 5.4  mmHg. The mean mitral valve gradient is 1.0  mmHg.   Tricuspid Valve: The tricuspid valve is grossly normal. Tricuspid valve   regurgitation is moderate.   Aortic Valve: The aortic valve is tricuspid. There is severe calcifcation  of the aortic valve. There is moderate aortic valve annular calcification.  Aortic valve regurgitation is trivial. Severe aortic stenosis is present.  Aortic valve mean gradient  measures 32.5 mmHg. Aortic valve peak gradient measures 54.6 mmHg. Aortic  valve area, by VTI measures 0.74 cm.   Pulmonic Valve: The pulmonic valve was grossly normal. Pulmonic valve  regurgitation is trivial.   Aorta: The aortic root and ascending aorta are structurally normal, with  no evidence of dilitation.   Venous: The inferior vena cava is normal in size with greater than 50%  respiratory variability, suggesting right atrial pressure of 3 mmHg.   IAS/Shunts: No atrial level shunt detected by color flow Doppler.     LEFT VENTRICLE  PLAX 2D  LVIDd:         4.10 cm     Diastology  LVIDs:         2.50 cm     LV e' medial:    5.66 cm/s  LV PW:         1.40 cm     LV E/e' medial:  13.9  LV IVS:        1.50 cm     LV e' lateral:   7.62 cm/s  LVOT diam:     2.00 cm     LV E/e' lateral: 10.3  LV SV:         61  LV SV Index:   33  LVOT Area:     3.14 cm    LV Volumes (MOD)  LV vol d, MOD A2C: 64.3 ml  LV vol d, MOD A4C: 72.5 ml  LV vol s, MOD A2C: 31.6 ml  LV vol s, MOD A4C: 26.7 ml  LV SV MOD A2C:     32.7 ml  LV SV MOD A4C:     72.5 ml  LV SV MOD BP:      39.2 ml   RIGHT VENTRICLE  RV Basal diam:  4.50 cm  RV Mid diam:    3.30 cm  RV S prime:     12.60 cm/s  TAPSE (M-mode): 1.7 cm   LEFT ATRIUM              Index        RIGHT ATRIUM           Index  LA diam:        4.60 cm  2.54 cm/m   RA Area:     19.10 cm  LA Vol (A2C):   87.1 ml  48.05 ml/m  RA Volume:   57.50 ml  31.72 ml/m  LA Vol (A4C):   108.0 ml 59.58 ml/m  LA Biplane Vol: 97.2 ml  53.62 ml/m   AORTIC VALVE                     PULMONIC VALVE  AV Area (Vmax):    0.69 cm  PV Vmax:          1.12 m/s  AV Area  (Vmean):   0.69 cm      PV Peak grad:     5.0 mmHg  AV Area (VTI):     0.74 cm      PR End Diast Vel: 8.07 msec  AV Vmax:           369.50 cm/s  AV Vmean:          275.000 cm/s  AV VTI:            0.818 m  AV Peak Grad:      54.6 mmHg  AV Mean Grad:      32.5 mmHg  LVOT Vmax:         80.90 cm/s  LVOT Vmean:        60.800 cm/s  LVOT VTI:          0.193 m  LVOT/AV VTI ratio: 0.24    AORTA  Ao Root diam: 3.20 cm  Ao Asc diam:  3.60 cm   MITRAL VALVE               TRICUSPID VALVE  MV Area (PHT): 1.76 cm    TR Peak grad:   41.2 mmHg  MV Area VTI:   1.68 cm    TR Vmax:        321.00 cm/s  MV Peak grad:  5.4 mmHg  MV Mean grad:  1.0 mmHg    SHUNTS  MV Vmax:       1.16 m/s    Systemic VTI:  0.19 m  MV Vmean:      52.5 cm/s   Systemic Diam: 2.00 cm  MV Decel Time: 430 msec  MV E velocity: 78.40 cm/s  MV A velocity: 35.60 cm/s  MV E/A ratio:  2.20   Nona Dell MD  Electronically signed by Nona Dell MD  Signature Date/Time: 04/19/2023/12:54:48 PM        Final      Physicians  Panel Physicians Referring Physician Case Authorizing Physician  Orbie Pyo, MD (Primary)     Procedures  RIGHT/LEFT HEART CATH AND CORONARY ANGIOGRAPHY  ABDOMINAL AORTOGRAM   Conclusion      Mid Cx lesion is 40% stenosed.   1.  Minimal, nonobstructive coronary artery disease. 2.  Fick cardiac output of 7.3 L/min and Fick cardiac index of 4.0 L/min/m with the following hemodynamics:            Right atrial pressure mean of 9 mmHg            Right ventricular pressure 41/2 with an end-diastolic pressure of 9 mmHg            Wedge pressure mean of 13 mmHg with V waves to 22 mmHg            PA pressure 45/14 with a mean of 28 mmHg            PVR of 2.1 Woods units            PA pulsatility index of 3.4 3.  Capacious iliofemoral vessels bilaterally.   Recommendation: Continue evaluation for aortic valve intervention.   Indications  Nonrheumatic aortic valve stenosis [I35.0  (ICD-10-CM)]   Procedural Details  Technical Details The patient is an 89 year old male with a history of severe symptomatic aortic stenosis, atrial flutter status post ablation, left atrial appendage ligation, IV dye allergy, hypertension, hyperlipidemia, and CKD  stage IIIa was seen in the outpatient setting due to lifestyle limiting dyspnea.  He is referred for right heart catheterization and coronary angiography study as part of evaluation for intervention for aortic valve stenosis.  After obtaining consent, the patient brought to the cardiac catheterization laboratory and prepped draped sterile fashion.  Xylocaine was used to anesthetize the right wrist and a 6 [place] IMA glide sheath placed there.  5000 units heparin and 5 mg verapamil were administered through the sheath.  Previously placed antecubital IV was exchanged for a 5 [place] Terumo glide sheath.  A 6 [place] TIG catheter used for selective coronary angiography and left heart catheterization.  A 5 French balloontipped catheter was used for right heart catheterization.  A 5 French pigtail catheter was maneuvered to the distal abdominal aorta and abdominal aortography and bilateral iliofemoral angiography was performed.  After review of the angiogram, no further interventions were pursued.  A TR band was placed.  There were no acute complications. Estimated blood loss <50 mL.   During this procedure medications were administered to achieve and maintain moderate conscious sedation while the patient's heart rate, blood pressure, and oxygen saturation were continuously monitored and I was present face-to-face 100% of this time. Laurette Schimke Rad Tech  Oliver Hum Cardiovascular Specialist and [place] Hun Cardiovascular Specialist are independent, trained observers who assisted in the monitoring of the patient's level of consciousness.   Medications (Filter: Administrations occurring from 1716 to 1756 on 06/08/23) fentaNYL  (SUBLIMAZE) injection (mcg)  Total dose: 25 mcg Date/Time Rate/Dose/Volume Action   06/08/23 1732 25 mcg Given   midazolam (VERSED) injection (mg)  Total dose: 1 mg Date/Time Rate/Dose/Volume Action   06/08/23 1732 1 mg Given   lidocaine (PF) (XYLOCAINE) 1 % injection (mL)  Total volume: 5 mL Date/Time Rate/Dose/Volume Action   06/08/23 1734 5 mL Given   Radial Cocktail/Verapamil only (mL)  Total volume: 10 mL Date/Time Rate/Dose/Volume Action   06/08/23 1738 10 mL Given   heparin sodium (porcine) injection (Units)  Total dose: 5,000 Units Date/Time Rate/Dose/Volume Action   06/08/23 1738 5,000 Units Given   Heparin (Porcine) in NaCl 2000-0.9 UNIT/L-% SOLN (mL)  Total volume: 1,000 mL Date/Time Rate/Dose/Volume Action   06/08/23 1740 1,000 mL Given   iohexol (OMNIPAQUE) 350 MG/ML injection (mL)  Total volume: 60 mL Date/Time Rate/Dose/Volume Action   06/08/23 1750 60 mL Given   Heparin (Porcine) in NaCl 1000-0.9 UT/500ML-% SOLN (mL)  Total volume: 500 mL Date/Time Rate/Dose/Volume Action   06/08/23 1750 500 mL Given    Sedation Time  Sedation Time Physician-1: 17 minutes 20 seconds Contrast     Administrations occurring from 1716 to 1756 on 06/08/23:  Medication Name Total Dose  iohexol (OMNIPAQUE) 350 MG/ML injection 60 mL   Radiation/Fluoro  Fluoro time: 3.2 (min) DAP: 8.2 (Gycm2) Cumulative Air Kerma: 106.2 (mGy) Complications  Complications documented before study signed (06/08/2023  6:00 PM)   No complications were associated with this study.  Documented by Pryor Montes, RCIS - 06/08/2023  5:13 PM     Coronary Findings  Diagnostic Dominance: Right Left Anterior Descending  There is mild diffuse disease throughout the vessel.    Left Circumflex  The vessel exhibits minimal luminal irregularities.  Mid Cx lesion is 40% stenosed.    Right Coronary Artery  The vessel exhibits minimal luminal irregularities.    Intervention   No  interventions have been documented.   Coronary Diagrams  Diagnostic Dominance: Right  Intervention   Implants  No implant documentation for this case.   Syngo Images   Show images for CARDIAC CATHETERIZATION Images on Long Term Storage   Show images for [place], [place] to Procedure Log  Procedure Log    Hemodynamics  Pressures Phases Resting  Right     RA Mean  mmHg 9    RA A-Wave  mmHg 12    RA V-Wave  mmHg 10  Pulmonary     PA  mmHg 45/14 (28)    PCW Mean  mmHg 13.0    PCW A-Wave  mmHg 18.0    PCW V-Wave  mmHg 22.0    PAPi   3.4    Saturations Phases Resting    PA  % 74    Arterial  % 95    Hemo Data  Flowsheet Row Most Recent Value  Fick Cardiac Output 7.27 L/min  Fick Cardiac Output Index 4.02 (L/min)/BSA  Aortic Mean Gradient 31.52 mmHg  Aortic Peak Gradient 29.1 mmHg  Aortic Valve Area 1.39  Aortic Value Area Index 0.77 cm2/BSA  RA A Wave 12 mmHg  RA V Wave 10 mmHg  RA Mean 9 mmHg  RV Systolic Pressure 41 mmHg  RV Diastolic Pressure 2 mmHg  RV EDP 9 mmHg  PA Systolic Pressure 45 mmHg  PA Diastolic Pressure 14 mmHg  PA Mean 28 mmHg  PW A Wave 18 mmHg  PW V Wave 22 mmHg  PW Mean 13 mmHg  AO Systolic Pressure 137 mmHg  AO Diastolic Pressure 66 mmHg  AO Mean 96 mmHg  LV Systolic Pressure 169 mmHg  LV Diastolic Pressure 11 mmHg  LV EDP 18 mmHg  Arterial Occlusion Pressure Extended Systolic Pressure 140 mmHg  Arterial Occlusion Pressure Extended Diastolic Pressure 64 mmHg  Arterial Occlusion Pressure Extended Mean Pressure 96 mmHg  Left Ventricular Apex Extended Systolic Pressure 170 mmHg  LVp Diastolic Pressure 10 mmHg  Left Ventricular Apex Extended EDP Pressure 19 mmHg  QP/QS 0.81  TPVR Index 8.6 HRUI  TSVR Index 23.87 HRUI  PVR SVR Ratio 0.21  TPVR/TSVR Ratio 0.36   ADDENDUM REPORT: 07/12/2023 22:15   ADDENDUM: Addendum created by mistake.     Electronically Signed   By: Minerva Fester M.D.   On: 07/12/2023  22:15    Addended by Zadie Cleverly, MD on 07/12/2023 10:17 PM  ADDENDUM REPORT: 07/12/2023 21:00   EXAM: OVER-READ INTERPRETATION  CT CHEST   The following report is an over-read performed by radiologist Dr. Myra Gianotti [facility] on 07/12/2023. This over-read does not include interpretation of cardiac or coronary anatomy or pathology. The coronary calcium score interpretation by the cardiologist is attached.   COMPARISON:  Same day CT chest abdomen pelvis   FINDINGS: Calcified mediastinal lymph nodes. Left atrial appendage occlusion device. No visualized focal consolidation, pleural effusion, or pneumothorax. No acute abnormality in the visualized upper abdomen. No acute osseous abnormality.   IMPRESSION: No acute abnormality. See separate report from same day CTA for more complete evaluation of the chest, abdomen, and pelvis.     Electronically Signed   By: Minerva Fester M.D.   On: 07/12/2023 21:00    Addended by Zadie Cleverly, MD on 07/12/2023  9:03 PM    Study Result  Narrative & Impression  CLINICAL DATA:  Severe Aortic Stenosis.   EXAM: Cardiac TAVR CT   TECHNIQUE: A non-contrast, gated CT scan was obtained with axial slices of 3 mm through the heart for aortic valve calcium  scoring. A 120 kV retrospective, gated, contrast cardiac scan was obtained. Gantry rotation speed was 250 msecs and collimation was 0.6 mm. Nitroglycerin was not given. The 3D data set was reconstructed in 5% intervals of the 0-95% of the R-R cycle. Systolic and diastolic phases were analyzed on a dedicated workstation using MPR, MIP, and VRT modes. The patient received 100 cc of contrast.   FINDINGS: Image quality: Excellent.   Noise artifact is: Limited.   Valve Morphology: Tricuspid aortic valve. Severely calcified leaflets with severely reduced movement in systole. Bulky calcification of the NCC.   Aortic Valve Calcium score: 2906   Aortic annular  dimension:   Phase assessed: 25%   Annular area: 523 mm2   Annular perimeter: 83.1 mm   Max diameter: 29.1 mm   Min diameter: 23.7 mm   Annular and subannular calcification: Single, moderate protruding calcification under then NCC extending into the LVOT.   Membranous septum length: 8.7 mm   Optimal coplanar projection: LAO 12 CAU 16   Coronary Artery Height above Annulus:   Left Main: 16.0 mm   Right Coronary:18.6 mm   Sinus of Valsalva Measurements:   Non-coronary: 31 mm   Right-coronary: 31 mm   Left-coronary: 34 mm   Sinus of Valsalva Height:   Non-coronary: 23.0 mm   Right-coronary: 19.0 mm   Left-coronary: 20.8 mm   Sinotubular Junction: 29 mm   Ascending Thoracic Aorta: 35 mm   Coronary Arteries: Normal coronary origin. Right dominance. The study was performed without use of NTG and is insufficient for plaque evaluation. Please refer to recent cardiac catheterization for coronary assessment.   Cardiac Morphology:   Right Atrium: Right atrial size is dilated.   Right Ventricle: The right ventricular cavity is dilated.   Left Atrium: Left atrial size is dilated. The LAA has been surgically closed without residual connection.   Left Ventricle: The ventricular cavity size is within normal limits.   Pulmonary arteries: Normal in size without proximal filling defect.   Pulmonary veins: Normal pulmonary venous drainage.   Pericardium: Normal thickness with no significant effusion or calcium present.   Mitral Valve: The mitral valve is normal structure without significant calcification.   Extra-cardiac findings: See attached radiology report for non-cardiac structures.   IMPRESSION: 1. Annular measurements support a 26 mm S3. Borderline SoV for 34 mm Evolut Pro.   2. Single, moderate protruding calcification under then NCC extending into the LVOT.   3. Sufficient coronary to annulus distance.   4. Optimal Fluoroscopic Angle for  Delivery: LAO 12 CAU 16   5. The LAA has been surgically closed without residual connection.   Gerri Spore T. Flora Lipps, MD   Electronically Signed: By: Lennie Odor M.D. On: 07/05/2023 14:07       Narrative & Impression  CLINICAL DATA:  Preop evaluation for TAVR.  Aortic atherosclerosis.   EXAM: CT ANGIOGRAPHY CHEST, ABDOMEN AND PELVIS   TECHNIQUE: Chest CT without contrast was not initially obtained.   Multidetector CT imaging through the chest, abdomen and pelvis was performed using the standard protocol during bolus administration of intravenous contrast. Multiplanar reconstructed images and MIPs were obtained and reviewed to evaluate the vascular anatomy.   RADIATION DOSE REDUCTION: This exam was performed according to the departmental dose-optimization program which includes automated exposure control, adjustment of the mA and/or kV according to patient size and/or use of iterative reconstruction technique.   CONTRAST:  95mL OMNIPAQUE IOHEXOL 350 MG/ML SOLN   COMPARISON:  Chest CT without contrast  04/16/2022, CT abdomen and pelvis with IV contrast 01/07/2017, CT abdomen and pelvis without contrast 12/27/2016.   FINDINGS: CTA CHEST FINDINGS   Cardiovascular: There is moderate aortic patchy calcific plaque, scattered plaque in the great vessels.   There is no aneurysm, dissection or flow significant stenosis. Moderate calcifications and thickening noted in the aortic valve leaflets as well.   Representative thoracic aortic measurements are as follows:   Valve: 3.0 x 3.0 cm on 6:92 and 7:121;   Sinuses of Valsalva: 3.4 x 3.3 cm on 6:91 and 7:122, respectively;   Sinotubular junction: 2.6 x 2.2 cm on 6:94 and 7:121, respectively;   Mid ascending aorta: 3.4 x 3.3 cm on 6:97 and 7:112, respectively;   Mid arch: 2.9 x 2.9 cm on 4:54 and 6:87, respectively;   Isthmus: 2.3 cm on 7:117;   Proximal descending aorta at T6-7: 2.6 cm on 7:117;   Distal descending  segment: 2.4 cm on 7:125;   Hiatal segment: 2.4 cm on 7:126.   There is increased moderate global cardiomegaly compared with the prior studies. Patchy three-vessel coronary artery calcifications. No pericardial effusion.   The pulmonary arteries are normal in caliber. There is no extension of the superior pulmonary veins.   Mediastinum/Nodes: There are calcified bilateral hilar and precarinal lymph nodes.   No noncalcified thoracic adenopathy is seen. Axillary spaces are not fully included but clear where visible.   Thoracic trachea contains a mucoid septation and is otherwise unremarkable. The main bronchi are clear. The thoracic esophagus unremarkable.   There are multiple bilateral thyroid nodules. Again, the largest 2 measure 2.2 cm on the right and 3.3 cm on the left, unchanged.   The most recent thyroid ultrasound was 05/13/2020. Consider nonemergent ultrasound follow-up for reevaluation if clinically warranted given advanced age.   Lungs/Pleura: There is mild reticulated scarring at the lung apices. There are scattered linear scar-like opacities in the lung bases, mild posterior atelectasis. No consolidation, effusion or suspicious lung nodule is seen.   Musculoskeletal: There is osteopenia, mild thoracic kyphodextroscoliosis and degenerative changes. No acute or suspicious osseous findings. The visualized chest wall with no mass.   Review of the MIP images confirms the above findings.   CTA ABDOMEN AND PELVIS FINDINGS   VASCULAR   Aorta: There are moderate patchy calcific plaques, scattered wall thrombus. No stenosis, dissection or aneurysm.   Celiac: Patent without evidence of aneurysm, dissection, vasculitis or significant stenosis.   There are nonstenosing ostial calcific plaques superiorly, patchy calcification splenic artery without stenosis or branch occlusion, trace calcification in the right hepatic artery.   SMA: Patent without evidence of  aneurysm, dissection, vasculitis or significant stenosis. There are nonstenosing ostial calcific plaques.   Renals: 2 arteries supply the left kidney, single artery supplies the right. The single right renal artery is widely patent.   The nondominant left renal artery arises first and is widely patent. The dominant left renal artery arises 1 cm below this and has a 50% soft plaque origin stenosis and is otherwise clear.   IMA: Patent without evidence of aneurysm, dissection, vasculitis or significant stenosis.   Inflow: Patent without evidence of aneurysm, dissection, vasculitis or significant stenosis. There are scattered nonstenosing calcific plaques, iliac vessel tortuosity without significant ectasia.   Veins: No obvious venous abnormality within the limitations of this arterial phase study.   Review of the MIP images confirms the above findings.   NON-VASCULAR   Hepatobiliary: No focal liver abnormality is seen. Status post cholecystectomy. No biliary  dilatation. There is a 1.4 cm rounded dense calcification posterior to the right lobe of the liver which is probably an extruded gallstone, alternatively a calcified retroperitoneal lymph node.   Pancreas: No abnormality.   Spleen: No abnormality.   Adrenals/Urinary Tract: There is no adrenal mass. Bilateral renal sinus cysts are again noted.   There is no mass enhancement either side, no hydronephrosis bilaterally.   There is a 3 mm nonobstructive caliceal stone in the inferior pole on the left, a 4 mm nonobstructive caliceal stone right midpole, and an oval 6 x 3 mm distal left ureteral stone 1 cm proximal to the UVJ.   The bladder wall and lumen are unremarkable.   Stomach/Bowel: The stomach is contracted. Small bowel and appendix are normal caliber.   There is diffuse colonic diverticulosis, without evidence of diverticulitis. There is a colocolic surgical anastomosis new since 2018 at the rectosigmoid  junction.   Multiple small bowel segments are contained within a sizable new left inguinal hernia sac, incompletely visualized.   The hernia mouth is 4 cm wide. The largest visualized portion of the hernia sac is 8 x 5.4 x 9.6 cm and displaces the root of the penis to the right.   There are no findings of hernia incarceration in the visualized portion of the hernia and no upstream bowel dilatation.   Lymphatic: No adenopathy is seen.   Reproductive: Prostate brachytherapy seeds are again noted. No prostatomegaly. In   Other: None.   Musculoskeletal: Moderate levorotary lumbar scoliosis and advanced degenerative changes of the lumbar spine. Acquired spinal stenosis L4-5.   Review of the MIP images confirms the above findings.   IMPRESSION: 1. Aortic, aortic valve leaflet, and coronary artery atherosclerosis. No aneurysm, dissection or flow significant stenosis. Representative measurements of the thoracic aorta as above. 2. Increased moderate global cardiomegaly. 3. 50% soft plaque origin stenosis of the dominant left renal artery. No other visceral artery stenosis. 4. Multiple bilateral thyroid nodules, unchanged. Consider nonemergent follow-up ultrasound if clinically warranted given advanced age. 5. Sizable new left inguinal hernia sac containing multiple small bowel segments, incompletely visualized. No findings of hernia incarceration or upstream bowel dilatation. 6. Colonic diverticulosis without evidence of diverticulitis. 7. Osteopenia, scoliosis and degenerative change. 8. Old prostate brachytherapy. Interval new rectosigmoid colocolic surgical anastomosis. 9. 6 x 3 mm oval distal left ureteral stone 1 cm from the UVJ. No upstream hydronephrosis is seen. 10. Nonobstructive nephrolithiasis. 11. These results will be called to the ordering clinician or representative by the Radiologist Assistant, and communication documented in the PACS or [employer].      Electronically Signed   By: Almira Bar M.D.   On: 07/12/2023 21:26     Impression:  This 89 year old gentleman has stage D3, severe, symptomatic aortic stenosis with NYHA class II symptoms that are primarily lower extremity fatigue with exertion.  I have personally reviewed his 2D echocardiogram, cardiac catheterization, and CTA studies.  His echo shows a severely calcified and thickened aortic valve with restricted leaflet mobility.  The mean gradient is 33 mmHg with a peak of 55 mmHg and a valve area of 0.74 cm, DVI of 0.24, and low stroke-volume index of 33 mmHg consistent with severe low-flow/low gradient aortic stenosis.  His ejection fraction is 60 to 65% with moderate concentric LVH and grade 2 diastolic dysfunction.  Cardiac catheterization shows minimal nonobstructive coronary disease.  I agree that aortic valve replacement is indicated in this patient for relief of his symptoms and to prevent  progressive left ventricular dysfunction.  He also has an enlarging left groin hernia which is going to require surgical treatment which will need to be delayed until his valve is replaced.  Given his advanced age and frailty I do not think he is a candidate for open surgical aortic valve replacement but transcatheter aortic valve replacement would be a reasonable option.  His gated cardiac CTA shows anatomy suitable for TAVR using a 26 mm SAPIEN 3 valve.  His abdominal and pelvic CTA shows adequate pelvic vascular anatomy to allow transfemoral insertion although his hernia does extend down into the left groin.  We may be able to displace this cephalad to allow left groin exposure or alternatively we could insert his pigtail through the right radial artery.  The patient and his daughter were counseled at length regarding treatment alternatives for management of severe symptomatic aortic stenosis. The risks and benefits of surgical intervention has been discussed in detail. Long-term prognosis with  medical therapy was discussed. Alternative approaches such as conventional surgical aortic valve replacement, transcatheter aortic valve replacement, and palliative medical therapy were compared and contrasted at length. This discussion was placed in the context of the patient's own specific clinical presentation and past medical history. All of their questions have been addressed.   Following the decision to proceed with transcatheter aortic valve replacement, a discussion was held regarding what types of management strategies would be attempted intraoperatively in the event of life-threatening complications, including whether or not the patient would be considered a candidate for the use of cardiopulmonary bypass and/or conversion to open sternotomy for attempted surgical intervention.  Due to his advanced age, frailty, and other comorbidities I do not think he is a candidate for emergent sternotomy to manage any intraoperative complications.  The patient has been advised of a variety of complications that might develop including but not limited to risks of death, stroke, paravalvular leak, aortic dissection or other major vascular complications, aortic annulus rupture, device embolization, cardiac rupture or perforation, mitral regurgitation, acute myocardial infarction, arrhythmia, heart block or bradycardia requiring permanent pacemaker placement, congestive heart failure, respiratory failure, renal failure, pneumonia, infection, other late complications related to structural valve deterioration or migration, or other complications that might ultimately cause a temporary or permanent loss of functional independence or other long term morbidity. The patient provides full informed consent for the procedure as described and all questions were answered.      Plan:  He will be scheduled for transfemoral TAVR using a SAPIEN 3 valve on 08/17/2023.  He will require premedication for contrast allergy.  I spent  60 minutes performing this consultation and > 50% of this time was spent face to face counseling and coordinating the care of this patient's severe symptomatic aortic stenosis.   Alleen Borne, MD 07/21/2023

## 2023-07-27 ENCOUNTER — Encounter: Payer: Self-pay | Admitting: *Deleted

## 2023-07-27 DIAGNOSIS — I350 Nonrheumatic aortic (valve) stenosis: Secondary | ICD-10-CM | POA: Diagnosis not present

## 2023-07-27 DIAGNOSIS — N201 Calculus of ureter: Secondary | ICD-10-CM | POA: Diagnosis not present

## 2023-07-27 DIAGNOSIS — I517 Cardiomegaly: Secondary | ICD-10-CM | POA: Diagnosis not present

## 2023-07-30 ENCOUNTER — Ambulatory Visit: Payer: Medicare Other | Attending: Cardiology | Admitting: Cardiology

## 2023-07-30 ENCOUNTER — Encounter: Payer: Self-pay | Admitting: Cardiology

## 2023-07-30 VITALS — BP 122/70 | HR 53 | Ht 67.0 in | Wt 155.4 lb

## 2023-07-30 DIAGNOSIS — I10 Essential (primary) hypertension: Secondary | ICD-10-CM

## 2023-07-30 DIAGNOSIS — I4892 Unspecified atrial flutter: Secondary | ICD-10-CM

## 2023-07-30 DIAGNOSIS — I350 Nonrheumatic aortic (valve) stenosis: Secondary | ICD-10-CM | POA: Diagnosis not present

## 2023-07-30 NOTE — Patient Instructions (Signed)
 Medication Instructions:  Your physician recommends that you continue on your current medications as directed. Please refer to the Current Medication list given to you today.  *If you need a refill on your cardiac medications before your next appointment, please call your pharmacy*  Lab Work: None If you have labs (blood work) drawn today and your tests are completely normal, you will receive your results only by: MyChart Message (if you have MyChart) OR A paper copy in the mail If you have any lab test that is abnormal or we need to change your treatment, we will call you to review the results.  Testing/Procedures: None  Follow-Up: At [facility], you and your health needs are our priority.  As part of our continuing mission to provide you with exceptional heart care, our providers are all part of one team.  This team includes your primary Cardiologist (physician) and Advanced Practice Providers or APPs (Physician Assistants and Nurse Practitioners) who all work together to provide you with the care you need, when you need it.  Your next appointment:   6 month(s)  Provider:   You may see Alvan Carrier, MD or one of the following Advanced Practice Providers on your designated Care Team:   Almarie Crate, NP  We recommend signing up for the patient portal called MyChart.  Sign up information is provided on this After Visit Summary.  MyChart is used to connect with patients for Virtual Visits (Telemedicine).  Patients are able to view lab/test results, encounter notes, upcoming appointments, etc.  Non-urgent messages can be sent to your provider as well.   To learn more about what you can do with MyChart, go to [email or web address].   Other Instructions

## 2023-07-30 NOTE — Progress Notes (Signed)
 Clinical Summary Kyle [name] is a 89 y.o.male seen today for follow up of the following medical problems.     1. Aortic stenosis - 09/2020 echo LVEF 65-70%, AV mean grad 10 DI 0.44 AVA VTI 1.39 SVI 32  10/2021 echo LVEF 70-75%, grade II dd, mod AS mean grad 24 DI 0.36 AVA VTI 1.12 - denies any SOB/DOE, no chest pain   10/2022 echo: LVEF 60-65%, indet diastolic fxn, severe AS mean grad 32, AVA VTI 0.59, DI 0.20, SVI 12 May 2023 LVEF 60-65%, grade II dd, mild MR, moderate TR, AV mean 33, DI 0.24, 0.74, SVI    - referred to structural heart team, plans are to pursue TAVR in May - prior issues with contrast allergy but was able to tolerate for cath and CT with appropriate prep    2.Atrial flutter - 11/07/20 DCCV. HRs low after conversion, diltiazem  was stopped and continued on toprol  25mg . Toprol  lowered to 12.5mg  daily after ongoing bradycardia and then later stopped   -recurrent aflutter at 11/13/21 appt,  - 11/20/21 succesful DCCV to SR. Low HRs after conversion, toprol  was stopped - 12/24/21 nursing visit EKG back in aflutter rate 93 -12/26/21 ER visit DCCV again to SR   -DCCV in ER 03/01/22 - amio started 03/03/22 - has maintained SR since then, no recent symptoms    - evaluated for watchman, not able to proceed due to severe contrast allergy  -Dr Cindie had suggested if patient interested referring him to Dr. Murriel to consider thoracoscopic, off-pump left atrial appendage occlusion/clipping.  Patient does have interest, debilitating arthritis unable to take NSAIDs on eliquis .   -s/p thorascopic closure left atrial appendage by Dr Murriel 04/20/22. Has done well        06/2022 monitor: 14 day monitor, rare ectopy, 12 runs SVT longest 9 beats, no symptoms reported   - 01/10/2023 seen in ER with recurrent aflutter, DCCV back to SR in ED.  - home HRs 80s to 90s at times, baseline typically low 50s.  - EKG today sinus brady 53 - Jan 2025 normal LFTs 01/2023 TFTs normal            2,. HTN - recent leg swelling since starting norvasc . He says he thinks years ago was tried on a medication for bp that also causes swelling but not sure of the name.  - off chlortahlidone, admit 05/2022 with dehydration and AKI    - compliant with meds   3. Hyperlipidemia - on simvastatin  - 07/2022 TC 173 TG 84 HDL 88 LDL 70 - upcoming labs with pcp    4. CAD - 05/2023 cath: minimal CAD    4. Preoperative evaluation - considering hernia surgery once valve has been replaced   Past Medical History:  Diagnosis Date   Arthritis    Atrial fibrillation (HCC)    Auto immune neutropenia (HCC)    Bullous pemphigoid    Cancer (HCC)    Prostate   Cervical spondylosis without myelopathy 01/22/2016   Chronic kidney disease    Per MD note 05/2021 CKD stage non specified   Complication of anesthesia    difficult to wake up after anesthesia   DVT (deep venous thrombosis) (HCC)    a. Has had a previous right lower extremity DVT in the setting of hospitalization and surgery (after his brain tumor was resected in 1993) but is no longer on Coumadin.   Dyslipidemia    Dysrhythmia  History of kidney stones    History of prostate cancer    Hypertension    Meningioma (HCC)    a. s/p resection 1990s.   Myasthenia (HCC) 02/14/2013   Nocturnal leg cramps    Ocular myasthenia gravis (HCC)    Peripheral neuropathy 11/23/2018   Seizures (HCC)    Sinus bradycardia    Sleep paralysis    Thyroid  nodule, cold      Allergies  Allergen Reactions   Iodinated Contrast Media Hives     pt was premedicated/hives reaction occurred 24 hours post injection, Onset Date: [id number]    Sulfa Antibiotics Hives, Swelling and Rash   Bactericin [Bacitracin] Other (See Comments)    Unknown reaction    Norvasc  [Amlodipine ] Swelling    Leg swelling     Current Outpatient Medications  Medication Sig Dispense Refill   amiodarone  (PACERONE ) 200 MG tablet Take 200 mg Daily on Saturday, Sunday Take  100 mg Daily Monday -Friday 90 tablet 3   aspirin  EC 81 MG tablet Take 1 tablet (81 mg total) by mouth daily. Swallow whole. (Patient not taking: Reported on 07/21/2023)     cholecalciferol (VITAMIN D3) 25 MCG (1000 UNIT) tablet Take 2,000 Units by mouth 2 (two) times daily.     clobetasol (TEMOVATE) 0.05 % external solution Apply 1 Application topically 2 (two) times daily as needed (blisters).     diphenhydrAMINE  (BENADRYL ) 50 MG capsule Take 1 capsule with last dose of prednisone  when leaving to come to hospital for heart cath on 06/08/23 1 capsule 0   famotidine  (PEPCID ) 20 MG tablet Take 1 tablet (20 mg total) by mouth as directed. Take 1 tablet (20 mg total) by mouth 2 (two) times daily. Take the day before your CTs and the day after your CTs. 4 tablet 0   fish oil-omega-3 fatty acids  1000 MG capsule Take 2 g by mouth 2 (two) times daily.     Flaxseed, Linseed, (FLAXSEED OIL) 1200 MG CAPS Take 1,200 mg by mouth daily.     gabapentin  (NEURONTIN ) 600 MG tablet Take 1 tablet (600 mg total) by mouth daily. (Patient taking differently: Take 600 mg by mouth at bedtime.) 90 tablet 3   Garlic (GARLIQUE PO) Take 1 tablet by mouth daily.     HYDROcodone -acetaminophen  (NORCO/VICODIN) 5-325 MG tablet Take 1 tablet by mouth 2 (two) times daily.     levETIRAcetam  (KEPPRA ) 500 MG tablet Take 1/2 tablet in the morning, take 1 tablet in the evening 135 tablet 3   losartan  (COZAAR ) 100 MG tablet Take 1 tablet (100 mg total) by mouth daily. 90 tablet 3   MAGNESIUM  CITRATE PO Take 400 mg by mouth at bedtime. Chew     niacinamide  500 MG tablet Take 500 mg by mouth 3 (three) times daily.     Polyethyl Glycol-Propyl Glycol 0.4-0.3 % SOLN Place 1 drop into both eyes 3 (three) times daily.     predniSONE  (DELTASONE ) 50 MG tablet Take 1 tablet 13 hours prior to CTs, then take 1 tablet 7 hours prior to CTs and take 1 tablet 1 hour prior to CTs. 3 tablet 0   pyridostigmine  (MESTINON ) 60 MG tablet Take 1 tablet (60 mg total)  by mouth 3 (three) times daily as needed. (Patient taking differently: Take 60 mg by mouth 4 (four) times daily.) 270 tablet 3   simvastatin  (ZOCOR ) 20 MG tablet Take 10 mg by mouth at bedtime.     tiZANidine  (ZANAFLEX ) 4 MG tablet Take 4 mg by  mouth at bedtime.     triamcinolone  (KENALOG ) 0.025 % ointment Apply 1 Application topically daily as needed. 30 g 0   No current facility-administered medications for this visit.     Past Surgical History:  Procedure Laterality Date   ABDOMINAL AORTOGRAM N/A 06/08/2023   Procedure: ABDOMINAL AORTOGRAM;  Surgeon: Wendel Lurena POUR, MD;  Location: [facility] INVASIVE CV LAB;  Service: Cardiovascular;  Laterality: N/A;   BLADDER SURGERY     removed part of the bladder   BRAIN SURGERY     CARDIOVERSION N/A 11/07/2020   Procedure: CARDIOVERSION;  Surgeon: Alvan Dorn FALCON, MD;  Location: [facility] ORS;  Service: Endoscopy;  Laterality: N/A;   CARDIOVERSION N/A 11/20/2021   Procedure: CARDIOVERSION;  Surgeon: Alvan Dorn FALCON, MD;  Location: [facility] ORS;  Service: Endoscopy;  Laterality: N/A;   CHOLECYSTECTOMY     CLIPPING OF ATRIAL APPENDAGE N/A 04/20/2022   Procedure: CLIPPING OF ATRIAL APPENDAGE USING 45 ATRIAL CLIP;  Surgeon: Murriel Toribio DEL, MD;  Location: [facility] OR;  Service: Open Heart Surgery;  Laterality: N/A;   COLONOSCOPY N/A 07/25/2013   Procedure: COLONOSCOPY;  Surgeon: Oneil DELENA Budge, MD;  Location: [facility] ENDO SUITE;  Service: Gastroenterology;  Laterality: N/A;   COLONOSCOPY N/A 11/10/2016   Procedure: COLONOSCOPY;  Surgeon: Budge Oneil, MD;  Location: [facility] END[facility] SUITE;  Service: Gastroenterology;  Laterality: N/A;   IR CATHETER TUBE CHANGE  01/02/2017   IR RADIOLOGIST EVAL & MGMT  01/06/2017   IR RADIOLOGIST EVAL & MGMT  01/20/2017   IR RADIOLOGIST EVAL & MGMT  02/03/2017   IR RADIOLOGIST EVAL & MGMT  02/17/2017   IR SINUS/FIST TUBE CHK-NON GI  03/03/2017   PARTIAL COLECTOMY     RADIOACTIVE SEED IMPLANT     RIGHT/LEFT HEART CATH AND CORONARY ANGIOGRAPHY N/A  06/08/2023   Procedure: RIGHT/LEFT HEART CATH AND CORONARY ANGIOGRAPHY;  Surgeon: Wendel Lurena POUR, MD;  Location: [facility] INVASIVE CV LAB;  Service: Cardiovascular;  Laterality: N/A;   TEE WITHOUT CARDIOVERSION N/A 04/20/2022   Procedure: TRANSESOPHAGEAL ECHOCARDIOGRAM (TEE);  Surgeon: Murriel Toribio DEL, MD;  Location[facility] OR;  Service: Open Heart Surgery;  Laterality: N/A;   VIDEO ASSISTED THORACOSCOPY Left 04/20/2022   Procedure: VIDEO ASSISTED THORACOSCOPY;  Surgeon: Murriel Toribio DEL, MD;  Location: [facility] OR;  Service: Thoracic;  Laterality: Left;     Allergies  Allergen Reactions   Iodinated Contrast Media Hives     pt was premedicated/hives reaction occurred 24 hours post injection, Onset Date: [id number]    Sulfa Antibiotics Hives, Swelling and Rash   Bactericin [Bacitracin] Other (See Comments)    Unknown reaction    Norvasc  [Amlodipine ] Swelling    Leg swelling      Family History  Problem Relation Age of Onset   Heart failure Mother    Diabetes Mother    Diabetes Sister    Dementia Sister    Lung cancer Son    Heart failure Daughter    Colon cancer Neg Hx      Social History Kyle [name] reports that he has never smoked. He has never been exposed to tobacco smoke. He has never used smokeless tobacco. Kyle [name] reports no history of alcohol  use.    Physical Examination Today's Vitals   07/30/23 0921  BP: 122/70  Pulse: (!) 53  SpO2: 94%  Weight: 155 lb 6.4 oz (70.5 kg)  Height: 5' 7 (1.702 m)   Body mass index is 24.34 kg/m.  Gen: resting comfortably, no acute distress HEENT: no  scleral icterus, pupils equal round and reactive, no palptable cervical adenopathy,  CV: RRR 3/6 systolic murmur rusb, no jvd Resp: Clear to auscultation bilaterally GI: abdomen is soft, non-tender, non-distended, normal bowel sounds, no hepatosplenomegaly MSK: extremities are warm, no edema.  Skin: warm, no rash Neuro:  no focal deficits Psych: appropriate affect   Diagnostic  Studies  10/2022 echo 1. Left ventricular ejection fraction, by estimation, is 60 to 65%. The  left ventricle has normal function. The left ventricle has no regional  wall motion abnormalities. There is moderate concentric left ventricular  hypertrophy. Left ventricular  diastolic parameters are indeterminate.   2. Right ventricular systolic function is normal. The right ventricular  size is normal. There is moderately elevated pulmonary artery systolic  pressure. The estimated right ventricular systolic pressure is 50.1 mmHg.   3. Left atrial size was severely dilated.   4. The mitral valve is grossly normal. Mild mitral valve regurgitation.  No evidence of mitral stenosis.   5. The tricuspid valve is abnormal. Tricuspid valve regurgitation is mild  to moderate.   6. The aortic valve is tricuspid. There is severe calcifcation of the  aortic valve. Aortic valve regurgitation is mild. Severe aortic valve  stenosis. Aortic valve area, by VTI measures 0.59 cm. Aortic valve mean  gradient measures 32.0 mmHg. Aortic  valve Vmax measures 3.70 m/s. DVI is 0.20.   7. Aortic dilatation noted. There is borderline dilatation of the  ascending aorta, measuring 36 mm. Normal aortic root.   8. The inferior vena cava is normal in size with greater than 50%  respiratory variability, suggesting right atrial pressure of 3 mmHg.    Jan 2025 echo 1. Left ventricular ejection fraction, by estimation, is 60 to 65%. The  left ventricle has normal function. The left ventricle has no regional  wall motion abnormalities. There is moderate concentric left ventricular  hypertrophy. Left ventricular  diastolic parameters are consistent with Grade II diastolic dysfunction  (pseudonormalization).   2. Right ventricular systolic function is normal. The right ventricular  size is normal. There is mildly elevated pulmonary artery systolic  pressure. The estimated right ventricular systolic pressure is 44.2 mmHg.    3. Left atrial size was severely dilated.   4. The mitral valve is degenerative. Mild mitral valve regurgitation.   5. Tricuspid valve regurgitation is moderate.   6. The aortic valve is tricuspid. There is severe calcifcation of the  aortic valve. Aortic valve regurgitation is trivial. Severe aortic valve  stenosis. Aortic valve mean gradient measures 32.5 mmHg. Dimentionless  index 0.24.   7. The inferior vena cava is normal in size with greater than 50%  respiratory variability, suggesting right atrial pressure of 3 mmHg.    05/2023 cath   Mid Cx lesion is 40% stenosed.   1.  Minimal, nonobstructive coronary artery disease. 2.  Fick cardiac output of 7.3 L/min and Fick cardiac index of 4.0 L/min/m with the following hemodynamics:            Right atrial pressure mean of 9 mmHg            Right ventricular pressure 41/2 with an end-diastolic pressure of 9 mmHg            Wedge pressure mean of 13 mmHg with V waves to 22 mmHg            PA pressure 45/14 with a mean of 28 mmHg  PVR of 2.1 Woods units            PA pulsatility index of 3.4 3.  Capacious iliofemoral vessels bilaterally.    Assessment and Plan   1.Aortic stenosis - severe AS, followed by structural heart team with plans for TAVR in the next few weeks.   2. Aflutter - EKG today shows sinus brady in the 50s - doing well without symptoms, continue current meds - prior LAA surgical closure, he is not on anticoag   3. HTN - at goal, continue current meds  F/u 6months  Dorn PHEBE Ross, M.D.

## 2023-08-04 DIAGNOSIS — M51362 Other intervertebral disc degeneration, lumbar region with discogenic back pain and lower extremity pain: Secondary | ICD-10-CM | POA: Diagnosis not present

## 2023-08-04 DIAGNOSIS — M47816 Spondylosis without myelopathy or radiculopathy, lumbar region: Secondary | ICD-10-CM | POA: Diagnosis not present

## 2023-08-16 ENCOUNTER — Other Ambulatory Visit: Payer: Self-pay

## 2023-08-16 DIAGNOSIS — I350 Nonrheumatic aortic (valve) stenosis: Secondary | ICD-10-CM

## 2023-08-16 MED ORDER — FAMOTIDINE 20 MG PO TABS: ORAL_TABLET | ORAL | 0 refills | Status: DC

## 2023-08-16 MED ORDER — PREDNISONE 50 MG PO TABS: ORAL_TABLET | ORAL | 0 refills | Status: DC

## 2023-08-17 ENCOUNTER — Ambulatory Visit: Payer: Medicare Other | Admitting: "Endocrinology

## 2023-08-20 ENCOUNTER — Ambulatory Visit (HOSPITAL_COMMUNITY)
Admission: RE | Admit: 2023-08-20 | Discharge: 2023-08-20 | Disposition: A | Discharge status: Home / Self Care | Source: Ambulatory Visit | Attending: Internal Medicine | Admitting: Internal Medicine

## 2023-08-20 ENCOUNTER — Other Ambulatory Visit: Payer: Self-pay

## 2023-08-20 ENCOUNTER — Encounter (HOSPITAL_COMMUNITY)
Admission: RE | Admit: 2023-08-20 | Discharge: 2023-08-20 | Disposition: A | Discharge status: Home / Self Care | Source: Ambulatory Visit | Attending: Internal Medicine | Admitting: Internal Medicine

## 2023-08-20 DIAGNOSIS — I350 Nonrheumatic aortic (valve) stenosis: Secondary | ICD-10-CM | POA: Diagnosis not present

## 2023-08-20 DIAGNOSIS — Z01818 Encounter for other preprocedural examination: Secondary | ICD-10-CM | POA: Insufficient documentation

## 2023-08-20 DIAGNOSIS — J984 Other disorders of lung: Secondary | ICD-10-CM | POA: Diagnosis not present

## 2023-08-20 LAB — CBC
HCT: 38.8 % — ABNORMAL LOW (ref 39.0–52.0)
Hemoglobin: 12.7 g/dL — ABNORMAL LOW (ref 13.0–17.0)
MCH: 31.6 pg (ref 26.0–34.0)
MCHC: 32.7 g/dL (ref 30.0–36.0)
MCV: 96.5 fL (ref 80.0–100.0)
Platelets: 199 10*3/uL (ref 150–400)
RBC: 4.02 MIL/uL — ABNORMAL LOW (ref 4.22–5.81)
RDW: 13.3 % (ref 11.5–15.5)
WBC: 6.9 10*3/uL (ref 4.0–10.5)
nRBC: 0 % (ref 0.0–0.2)

## 2023-08-20 LAB — COMPREHENSIVE METABOLIC PANEL WITH GFR
ALT: 15 U/L (ref 0–44)
AST: 24 U/L (ref 15–41)
Albumin: 3.9 g/dL (ref 3.5–5.0)
Alkaline Phosphatase: 49 U/L (ref 38–126)
Anion gap: 11 (ref 5–15)
BUN: 25 mg/dL — ABNORMAL HIGH (ref 8–23)
CO2: 24 mmol/L (ref 22–32)
Calcium: 9.8 mg/dL (ref 8.9–10.3)
Chloride: 105 mmol/L (ref 98–111)
Creatinine, Ser: 1.51 mg/dL — ABNORMAL HIGH (ref 0.61–1.24)
GFR, Estimated: 45 mL/min — ABNORMAL LOW (ref >60)
Glucose, Bld: 104 mg/dL — ABNORMAL HIGH (ref 70–99)
Potassium: 4.5 mmol/L (ref 3.5–5.1)
Sodium: 140 mmol/L (ref 135–145)
Total Bilirubin: 1.1 mg/dL (ref 0.0–1.2)
Total Protein: 7.2 g/dL (ref 6.5–8.1)

## 2023-08-20 LAB — URINALYSIS, ROUTINE W REFLEX MICROSCOPIC
Bilirubin Urine: NEGATIVE
Glucose, UA: NEGATIVE mg/dL
Hgb urine dipstick: NEGATIVE
Ketones, ur: NEGATIVE mg/dL
Leukocytes,Ua: NEGATIVE
Nitrite: NEGATIVE
Protein, ur: NEGATIVE mg/dL
Specific Gravity, Urine: 1.018 (ref 1.005–1.030)
pH: 5 (ref 5.0–8.0)

## 2023-08-20 LAB — SURGICAL PCR SCREEN
MRSA, PCR: NEGATIVE
Staphylococcus aureus: POSITIVE — AB

## 2023-08-20 LAB — PROTIME-INR
INR: 1.1 (ref 0.8–1.2)
Prothrombin Time: 14.7 s (ref 11.4–15.2)

## 2023-08-20 LAB — TYPE AND SCREEN
ABO/RH(D): A POS
Antibody Screen: NEGATIVE

## 2023-08-20 NOTE — Progress Notes (Signed)
 Patient signed all consents at PAT lab appointment. CHG soap and instructions were given to patient. CHG surgical prep reviewed with patient and all questions answered.  Patients chart send to anesthesia for review. Pt denies any respiratory illness/infection in the last two months.

## 2023-08-23 MED ORDER — POTASSIUM CHLORIDE 2 MEQ/ML IV SOLN
80.0000 meq | INTRAVENOUS | Status: DC
  Filled 2023-08-23: qty 40

## 2023-08-23 MED ORDER — DEXMEDETOMIDINE HCL IN NACL 400 MCG/100ML IV SOLN
0.1000 ug/kg/h | INTRAVENOUS | Status: AC
  Administered 2023-08-24: 35.24 ug via INTRAVENOUS
  Administered 2023-08-24: 1 ug/kg/h via INTRAVENOUS
  Filled 2023-08-23: qty 100

## 2023-08-23 MED ORDER — HEPARIN 30,000 UNITS/1000 ML (OHS) CELLSAVER SOLUTION
Status: DC
  Filled 2023-08-23: qty 1000

## 2023-08-23 MED ORDER — NOREPINEPHRINE 4 MG/250ML-% IV SOLN
0.0000 ug/min | INTRAVENOUS | Status: DC
  Filled 2023-08-23: qty 250

## 2023-08-23 MED ORDER — CEFAZOLIN SODIUM-DEXTROSE 2-4 GM/100ML-% IV SOLN
2.0000 g | INTRAVENOUS | Status: AC
  Administered 2023-08-24: 2 g via INTRAVENOUS
  Filled 2023-08-23: qty 100

## 2023-08-23 NOTE — Anesthesia Preprocedure Evaluation (Signed)
 Anesthesia Evaluation  Patient identified by MRN, date of birth, ID band Patient awake    Reviewed: Allergy & Precautions, NPO status , Patient's Chart, lab work & pertinent test results  Airway Mallampati: III  TM Distance: >3 FB Neck ROM: Limited    Dental  (+) Dental Advisory Given, Missing, Chipped, Poor Dentition   Pulmonary neg pulmonary ROS   Pulmonary exam normal breath sounds clear to auscultation       Cardiovascular hypertension, Pt. on medications + dysrhythmias Atrial Fibrillation  Rhythm:Regular Rate:Normal + Systolic murmurs    Neuro/Psych Seizures -, Well Controlled,   Neuromuscular disease    GI/Hepatic negative GI ROS, Neg liver ROS,,,  Endo/Other  negative endocrine ROS    Renal/GU Renal disease     Musculoskeletal  (+) Arthritis ,    Abdominal   Peds  Hematology negative hematology ROS (+)   Anesthesia Other Findings   Reproductive/Obstetrics                             Lab Results  Component Value Date   WBC 6.9 08/20/2023   HGB 12.7 (L) 08/20/2023   HCT 38.8 (L) 08/20/2023   MCV 96.5 08/20/2023   PLT 199 08/20/2023   Lab Results  Component Value Date   CREATININE 1.51 (H) 08/20/2023   BUN 25 (H) 08/20/2023   NA 140 08/20/2023   K 4.5 08/20/2023   CL 105 08/20/2023   CO2 24 08/20/2023    Anesthesia Physical Anesthesia Plan  ASA: 4  Anesthesia Plan: MAC   Post-op Pain Management: Tylenol  PO (pre-op)*   Induction: Intravenous  PONV Risk Score and Plan: 2 and Dexamethasone , Ondansetron  and Treatment may vary due to age or medical condition  Airway Management Planned: Simple Face Mask  Additional Equipment: Arterial line  Intra-op Plan:   Post-operative Plan:   Informed Consent: I have reviewed the patients History and Physical, chart, labs and discussed the procedure including the risks, benefits and alternatives for the proposed anesthesia  with the patient or authorized representative who has indicated his/her understanding and acceptance.     Dental advisory given  Plan Discussed with: CRNA  Anesthesia Plan Comments:         Anesthesia Quick Evaluation

## 2023-08-23 NOTE — H&P (Signed)
 Cardiothoracic Surgery Admission History and Physical   PCP is Kathyleen Parkins, MD Referring Provider is Alyssa Backbone, MD Primary Cardiologist is Armida Lander, MD   Reason for admission: Severe aortic stenosis   HPI:   The patient is an 89 year old gentleman with history of hypertension, dyslipidemia, stage III chronic kidney disease, paroxysmal atrial flutter status post multiple cardioversions and status post thoracoscopic closure of the left atrial appendage in January 2024 by Dr. Narciso Backers, kidney stones, prostate cancer, contrast dye allergy, and severe aortic stenosis followed by Dr. Amanda Jungling who is referred for consideration of TAVR.  He has an enlarging left-sided hernia over the past 6 months that is causing significant discomfort especially when he walks and will require surgical treatment.  His most recent echo on 04/19/2023 shows a severely calcified and thickened aortic valve with restricted leaflet mobility.  The mean gradient was 32.5 mmHg with a peak of 55 mmHg and a valve area by VTI of 0.74 cm.  Left ventricular ejection fraction is 60 to 65% with moderate concentric LVH and grade 2 diastolic dysfunction.  He underwent an exercise tolerance test since he said that he felt well overall and denied any significant symptoms.  This showed some evidence of exercise limitation with baseline diffuse mild ST depression that was slightly more prominent in recovery.  He subsequently underwent cardiac catheterization showing minimal nonobstructive coronary disease.  Right heart catheterization was fairly unremarkable.   He lives at home with his wife.  He denies any shortness of breath or fatigue but is not that active.  His main complaint is of tiredness and weakness in his legs with exertion.  His activity is limited due to arthritis.  He denies any dizziness or syncope.  He has had some peripheral edema particularly in the right lower leg which is the side where he had a DVT in the  past.           Past Medical History:  Diagnosis Date   Arthritis     Atrial fibrillation (HCC)     Auto immune neutropenia (HCC)     Bullous pemphigoid     Cancer (HCC)      Prostate   Cervical spondylosis without myelopathy 01/22/2016   Chronic kidney disease      Per MD note 05/2021 CKD stage non specified   Complication of anesthesia      difficult to wake up after anesthesia   DVT (deep venous thrombosis) (HCC)      a. Has had a previous right lower extremity DVT in the setting of hospitalization and surgery (after his brain tumor was resected in 1993) but is no longer on Coumadin.   Dyslipidemia     Dysrhythmia     History of kidney stones     History of prostate cancer     Hypertension     Meningioma (HCC)      a. s/p resection 1990s.   Myasthenia (HCC) 02/14/2013   Nocturnal leg cramps     Ocular myasthenia gravis (HCC)     Peripheral neuropathy 11/23/2018   Seizures (HCC)     Sinus bradycardia     Sleep paralysis     Thyroid  nodule, cold                 Past Surgical History:  Procedure Laterality Date   ABDOMINAL AORTOGRAM N/A 06/08/2023    Procedure: ABDOMINAL AORTOGRAM;  Surgeon: Kyra Phy, MD;  Location: [facility];  Service: Cardiovascular;  Laterality: N/A;   BLADDER SURGERY        removed part of the bladder   BRAIN SURGERY       CARDIOVERSION N/A 11/07/2020    Procedure: CARDIOVERSION;  Surgeon: Laurann Pollock, MD;  Location: [facility];  Service: Endoscopy;  Laterality: N/A;   CARDIOVERSION N/A 11/20/2021    Procedure: CARDIOVERSION;  Surgeon: Laurann Pollock, MD;  Location: [facility];  Service: Endoscopy;  Laterality: N/A;   CHOLECYSTECTOMY       CLIPPING OF ATRIAL APPENDAGE N/A 04/20/2022    Procedure: CLIPPING OF ATRIAL APPENDAGE USING 45 ATRIAL CLIP;  Surgeon: Eleanora Grew, MD;  Location: [facility];  Service: Open Heart Surgery;  Laterality: N/A;   COLONOSCOPY N/A 07/25/2013    Procedure: COLONOSCOPY;  Surgeon: Beau Bound, MD;   Location: [facility];  Service: Gastroenterology;  Laterality: N/A;   COLONOSCOPY N/A 11/10/2016    Procedure: COLONOSCOPY;  Surgeon: Alanda Allegra, MD;  Location: [facility];  Service: Gastroenterology;  Laterality: N/A;   IR CATHETER TUBE CHANGE   01/02/2017   IR RADIOLOGIST EVAL & MGMT   01/06/2017   IR RADIOLOGIST EVAL & MGMT   01/20/2017   IR RADIOLOGIST EVAL & MGMT   02/03/2017   IR RADIOLOGIST EVAL & MGMT   02/17/2017   IR SINUS/FIST TUBE CHK-NON GI   03/03/2017   PARTIAL COLECTOMY       RADIOACTIVE SEED IMPLANT       RIGHT/LEFT HEART CATH AND CORONARY ANGIOGRAPHY N/A 06/08/2023    Procedure: RIGHT/LEFT HEART CATH AND CORONARY ANGIOGRAPHY;  Surgeon: Kyra Phy, MD;  Location: [facility];  Service: Cardiovascular;  Laterality: N/A;   TEE WITHOUT CARDIOVERSION N/A 04/20/2022    Procedure: TRANSESOPHAGEAL ECHOCARDIOGRAM (TEE);  Surgeon: Eleanora Grew, MD;  Location: [facility] OR;  Service: Open Heart Surgery;  Laterality: N/A;   VIDEO ASSISTED THORACOSCOPY Left 04/20/2022    Procedure: VIDEO ASSISTED THORACOSCOPY;  Surgeon: Eleanora Grew, MD;  Location: [facility] OR;  Service: Thoracic;  Laterality: Left;               Family History  Problem Relation Age of Onset   Heart failure Mother     Diabetes Mother     Diabetes Sister     Dementia Sister     Lung cancer Son     Heart failure Daughter     Colon cancer Neg Hx            Social History         Socioeconomic History   Marital status: Married      Spouse name: Diplomatic Services operational officer   Number of children: 1   Years of education: 12 TH   Highest education level: Not on file  Occupational History   Occupation: retired  Tobacco Use   Smoking status: Never      Passive exposure: Never   Smokeless tobacco: Never  Vaping Use   Vaping status: Never Used  Substance and Sexual Activity   Alcohol  use: No   Drug use: No   Sexual activity: Not on file  Other Topics Concern   Not on file  Social History Narrative    Patient  is right handed.    Patient drinks 1 cup of coffee daily.    Social Drivers of Health        Financial Resource Strain: Low Risk  (12/15/2022)    Overall Financial Resource Strain (CARDIA)  Difficulty of Paying Living Expenses: Not hard at all  Food Insecurity: Low Risk  (07/19/2023)    Received from [facility]    Hunger Vital Sign     Worried About Running Out of Food in the Last Year: Never true     Ran Out of Food in the Last Year: Never true  Transportation Needs: No Transportation Needs (07/19/2023)    Received from [employer]     In the past 12 months, has lack of reliable transportation kept you from medical appointments, meetings, work or from getting things needed for daily living? : No  Physical Activity: Sufficiently Active (11/09/2022)    Exercise Vital Sign     Days of Exercise per Week: 7 days     Minutes of Exercise per Session: 30 min  Stress: Not on file  Social Connections: Not on file  Intimate Partner Violence: Not At Risk (06/09/2022)    Humiliation, Afraid, Rape, and Kick questionnaire     Fear of Current or Ex-Partner: No     Emotionally Abused: No     Physically Abused: No     Sexually Abused: No             Prior to Admission medications   Medication Sig Start Date End Date Taking? Authorizing Provider  amiodarone  (PACERONE ) 200 MG tablet Take 200 mg Daily on Saturday, Sunday Take 100 mg Daily Monday -Friday 11/12/22   Yes Tammie Fall, MD  cholecalciferol (VITAMIN D3) 25 MCG (1000 UNIT) tablet Take 2,000 Units by mouth 2 (two) times daily.     Yes [provider]  clobetasol (TEMOVATE) 0.05 % external solution Apply 1 Application topically 2 (two) times daily as needed (blisters).     Yes [provider]  diphenhydrAMINE  (BENADRYL ) 50 MG capsule Take 1 capsule with last dose of prednisone  when leaving to come to hospital for heart cath on 06/08/23 06/04/23   Yes Thukkani, Arun K, MD  famotidine  (PEPCID ) 20 MG tablet  Take 1 tablet (20 mg total) by mouth as directed. Take 1 tablet (20 mg total) by mouth 2 (two) times daily. Take the day before your CTs and the day after your CTs. 06/10/23 06/09/24 Yes Ardia Kraft, PA-C  fish oil-omega-3 fatty acids  1000 MG capsule Take 2 g by mouth 2 (two) times daily.     Yes [provider]  Flaxseed, Linseed, (FLAXSEED OIL) 1200 MG CAPS Take 1,200 mg by mouth daily.     Yes [provider]  gabapentin  (NEURONTIN ) 600 MG tablet Take 1 tablet (600 mg total) by mouth daily. Patient taking differently: Take 600 mg by mouth at bedtime. 12/28/22   Yes Phebe Brasil, MD  Garlic (GARLIQUE PO) Take 1 tablet by mouth daily.     Yes [provider]  HYDROcodone -acetaminophen  (NORCO/VICODIN) 5-325 MG tablet Take 1 tablet by mouth 2 (two) times daily. 11/06/21   Yes [provider]  levETIRAcetam  (KEPPRA ) 500 MG tablet Take 1/2 tablet in the morning, take 1 tablet in the evening 12/28/22   Yes Phebe Brasil, MD  losartan  (COZAAR ) 100 MG tablet Take 1 tablet (100 mg total) by mouth daily. 05/28/23 08/26/23 Yes BranchJoyceann No, MD  MAGNESIUM  CITRATE PO Take 400 mg by mouth at bedtime. Chew     Yes [provider]  niacinamide  500 MG tablet Take 500 mg by mouth 3 (three) times daily.     Yes [provider]  Polyethyl Glycol-Propyl Glycol  0.4-0.3 % SOLN Place 1 drop into both eyes 3 (three) times daily.     Yes [provider]  predniSONE  (DELTASONE ) 50 MG tablet Take 1 tablet 13 hours prior to CTs, then take 1 tablet 7 hours prior to CTs and take 1 tablet 1 hour prior to CTs. 06/10/23   Yes Ardia Kraft, PA-C  pyridostigmine  (MESTINON ) 60 MG tablet Take 1 tablet (60 mg total) by mouth 3 (three) times daily as needed. Patient taking differently: Take 60 mg by mouth 4 (four) times daily. 12/28/22   Yes Phebe Brasil, MD  simvastatin  (ZOCOR ) 20 MG tablet Take 10 mg by mouth at bedtime. 10/24/21   Yes [provider]   tiZANidine  (ZANAFLEX ) 4 MG tablet Take 4 mg by mouth at bedtime. 07/19/20   Yes [provider]  triamcinolone  (KENALOG ) 0.025 % ointment Apply 1 Application topically daily as needed. 06/11/23   Yes Ardia Kraft, PA-C  aspirin  EC 81 MG tablet Take 1 tablet (81 mg total) by mouth daily. Swallow whole. Patient not taking: Reported on 07/21/2023 06/04/23     Thukkani, Arun K, MD            Current Outpatient Medications  Medication Sig Dispense Refill   amiodarone  (PACERONE ) 200 MG tablet Take 200 mg Daily on Saturday, Sunday Take 100 mg Daily Monday -Friday 90 tablet 3   cholecalciferol (VITAMIN D3) 25 MCG (1000 UNIT) tablet Take 2,000 Units by mouth 2 (two) times daily.       clobetasol (TEMOVATE) 0.05 % external solution Apply 1 Application topically 2 (two) times daily as needed (blisters).       diphenhydrAMINE  (BENADRYL ) 50 MG capsule Take 1 capsule with last dose of prednisone  when leaving to come to hospital for heart cath on 06/08/23 1 capsule 0   famotidine  (PEPCID ) 20 MG tablet Take 1 tablet (20 mg total) by mouth as directed. Take 1 tablet (20 mg total) by mouth 2 (two) times daily. Take the day before your CTs and the day after your CTs. 4 tablet 0   fish oil-omega-3 fatty acids  1000 MG capsule Take 2 g by mouth 2 (two) times daily.       Flaxseed, Linseed, (FLAXSEED OIL) 1200 MG CAPS Take 1,200 mg by mouth daily.       gabapentin  (NEURONTIN ) 600 MG tablet Take 1 tablet (600 mg total) by mouth daily. (Patient taking differently: Take 600 mg by mouth at bedtime.) 90 tablet 3   Garlic (GARLIQUE PO) Take 1 tablet by mouth daily.       HYDROcodone -acetaminophen  (NORCO/VICODIN) 5-325 MG tablet Take 1 tablet by mouth 2 (two) times daily.       levETIRAcetam  (KEPPRA ) 500 MG tablet Take 1/2 tablet in the morning, take 1 tablet in the evening 135 tablet 3   losartan  (COZAAR ) 100 MG tablet Take 1 tablet (100 mg total) by mouth daily. 90 tablet 3   MAGNESIUM  CITRATE PO Take 400 mg by  mouth at bedtime. Chew       niacinamide  500 MG tablet Take 500 mg by mouth 3 (three) times daily.       Polyethyl Glycol-Propyl Glycol 0.4-0.3 % SOLN Place 1 drop into both eyes 3 (three) times daily.       predniSONE  (DELTASONE ) 50 MG tablet Take 1 tablet 13 hours prior to CTs, then take 1 tablet 7 hours prior to CTs and take 1 tablet 1 hour prior to CTs. 3 tablet 0   pyridostigmine  (MESTINON )  60 MG tablet Take 1 tablet (60 mg total) by mouth 3 (three) times daily as needed. (Patient taking differently: Take 60 mg by mouth 4 (four) times daily.) 270 tablet 3   simvastatin  (ZOCOR ) 20 MG tablet Take 10 mg by mouth at bedtime.       tiZANidine  (ZANAFLEX ) 4 MG tablet Take 4 mg by mouth at bedtime.       triamcinolone  (KENALOG ) 0.025 % ointment Apply 1 Application topically daily as needed. 30 g 0   aspirin  EC 81 MG tablet Take 1 tablet (81 mg total) by mouth daily. Swallow whole. (Patient not taking: Reported on 07/21/2023)          No current facility-administered medications for this visit.        Allergies       Allergies  Allergen Reactions   Iodinated Contrast Media Hives       pt was premedicated/hives reaction occurred 24 hours post injection, Onset Date: [id number]     Sulfa Antibiotics Hives, Swelling and Rash   Bactericin [Bacitracin] Other (See Comments)      Unknown reaction    Norvasc  [Amlodipine ] Swelling      Leg swelling            Review of Systems:               General:                      normal appetite, normal energy, no weight gain, no weight loss, no fever             Cardiac:                       no chest pain with exertion, no chest pain at rest, no SOB with exertion, no resting SOB, no PND, no orthopnea, no palpitations, no arrhythmia, + atrial flutter, + LE edema, no dizzy spells, no syncope             Respiratory:                 no shortness of breath, no home oxygen, no productive cough, no dry cough, no bronchitis, no wheezing, no hemoptysis, no  asthma, no pain with inspiration or cough, no sleep apnea, no CPAP at night             GI:                               no difficulty swallowing, no reflux, no frequent heartburn, + hiatal hernia, no abdominal pain, no constipation, no diarrhea, no hematochezia, no hematemesis, no melena             GU:                              no dysuria,  no frequency, no urinary tract infection, no hematuria, no enlarged prostate, + kidney stones, + chronic  kidney disease             Vascular:                     + pain suggestive of claudication, no pain in feet, no leg cramps, no varicose veins, + DVT, no non-healing foot ulcer             Neuro:  no stroke, no TIA's, + seizures, no headaches, no temporary blindness one eye,  no slurred speech, no peripheral neuropathy, no chronic pain, + instability of gait, no memory/cognitive dysfunction             Musculoskeletal:         + arthritis, no joint swelling, no myalgias, + difficulty walking, + decreased mobility              Skin:                            no rash, no itching, no skin infections, no pressure sores or ulcerations             Psych:                         no anxiety, no depression, no nervousness, no unusual recent stress             Eyes:                           no blurry vision, no floaters, no recent vision changes, no  glasses or contacts             ENT:                            + hearing loss, no loose or painful teeth, no dentures, no dental issues             Hematologic:               + easy bruising, no abnormal bleeding, no clotting disorder, no frequent epistaxis             Endocrine:                   no diabetes, does not check CBG's at home                            Physical Exam:               BP 124/60   Pulse 71   Resp 18   Ht 5' 7.5" (1.715 m)   Wt 150 lb (68 kg)   SpO2 95% Comment: RA  BMI 23.15 kg/m              General:                      Elderly,  frail-appearing              HEENT:                       Unremarkable, NCAT, PERLA, EOMI             Neck:                           no JVD, no bruits, no adenopathy              Chest:                          clear to auscultation, symmetrical breath sounds, no wheezes, no rhonchi  CV:                              RRR, 3/6 systolic murmur RSB, no diastolic murmur             Abdomen:                    soft, non-tender, hernia right lower abdomen extending into groin medially.             Extremities:                 warm, well-perfused, pedal pulses not palpable, mild lower extremity edema             Rectal/GU                   Deferred             Neuro:                         Grossly non-focal and symmetrical throughout             Skin:                            Clean and dry, no rashes, no breakdown   Diagnostic Tests:   ECHOCARDIOGRAM REPORT       Patient Name:   Kyle [name]. Date of Exam: 04/19/2023  Medical Rec #:  [id number]           Height:       67.0 in  Accession #:    [id number]          Weight:       154.6 lb  Date of Birth:  1934/06/10            BSA:          1.813 m  Patient Age:    88 years            BP:           140/70 mmHg  Patient Gender: M                   HR:           72 bpm.  Exam Location:  [place]   Procedure: 2D Echo, Cardiac Doppler and Color Doppler   Indications:    I35.0 Nonrheumatic aortic (valve) stenosis    History:        Patient has prior history of Echocardiogram examinations,  most                 recent 11/10/2022. Aortic Valve Disease, Arrythmias:Atrial                  Fibrillation and Atrial Flutter; Risk  Factors:Hypertension,                 Dyslipidemia and Non-Smoker.    Sonographer:    Reed Canes RCS, RVS  Referring Phys: [id number] Joyceann No BRANCH     Sonographer Comments: Global longitudinal strain was attempted.  IMPRESSIONS     1. Left ventricular ejection fraction, by estimation, is 60 to 65%. The  left ventricle has normal  function. The left ventricle has no regional  wall motion abnormalities. There is moderate concentric left ventricular  hypertrophy. Left ventricular  diastolic parameters  are consistent with Grade II diastolic dysfunction  (pseudonormalization).   2. Right ventricular systolic function is normal. The right ventricular  size is normal. There is mildly elevated pulmonary artery systolic  pressure. The estimated right ventricular systolic pressure is 44.2 mmHg.   3. Left atrial size was severely dilated.   4. The mitral valve is degenerative. Mild mitral valve regurgitation.   5. Tricuspid valve regurgitation is moderate.   6. The aortic valve is tricuspid. There is severe calcifcation of the  aortic valve. Aortic valve regurgitation is trivial. Severe aortic valve  stenosis. Aortic valve mean gradient measures 32.5 mmHg. Dimentionless  index 0.24.   7. The inferior vena cava is normal in size with greater than 50%  respiratory variability, suggesting right atrial pressure of 3 mmHg.   Comparison(s): Prior images reviewed side by side. LVEF normal range at  60-65%. Aortic stenosis severe as before with stable mean AV gradient 32  mmHg.   FINDINGS   Left Ventricle: Left ventricular ejection fraction, by estimation, is 60  to 65%. The left ventricle has normal function. The left ventricle has no  regional wall motion abnormalities. Global longitudinal strain performed  but not reported based on  interpreter judgement due to suboptimal tracking. The left ventricular  internal cavity size was normal in size. There is moderate concentric left  ventricular hypertrophy. Left ventricular diastolic parameters are  consistent with Grade II diastolic  dysfunction (pseudonormalization).   Right Ventricle: The right ventricular size is normal. No increase in  right ventricular wall thickness. Right ventricular systolic function is  normal. There is mildly elevated pulmonary artery systolic  pressure. The  tricuspid regurgitant velocity is 3.21   m/s, and with an assumed right atrial pressure of 3 mmHg, the estimated  right ventricular systolic pressure is 44.2 mmHg.   Left At[facility]: Left atrial size was severely dilated.   Right [facility]: Right atrial size was normal in size.   Pericardium: Trivial pericardial effusion is present. The pericardial  effusion is posterior to the left ventricle.   Mitral Valve: The mitral valve is degenerative in appearance. There is  mild thickening of the mitral valve leaflet(s). Mild mitral annular  calcification. Mild mitral valve regurgitation. MV peak gradient, 5.4  mmHg. The mean mitral valve gradient is 1.0  mmHg.   Tricuspid Valve: The tricuspid valve is grossly normal. Tricuspid valve  regurgitation is moderate.   Aortic Valve: The aortic valve is tricuspid. There is severe calcifcation  of the aortic valve. There is moderate aortic valve annular calcification.  Aortic valve regurgitation is trivial. Severe aortic stenosis is present.  Aortic valve mean gradient  measures 32.5 mmHg. Aortic valve peak gradient measures 54.6 mmHg. Aortic  valve area, by VTI measures 0.74 cm.   Pulmonic Valve: The pulmonic valve was grossly normal. Pulmonic valve  regurgitation is trivial.   Aorta: The aortic root and ascending aorta are structurally normal, with  no evidence of dilitation.   Venous: The inferior vena cava is normal in size with greater than 50%  respiratory variability, suggesting right atrial pressure of 3 mmHg.   IAS/Shunts: No atrial level shunt detected by color flow Doppler.     LEFT VENTRICLE  PLAX 2D  LVIDd:         4.10 cm     Diastology  LVIDs:         2.50 cm     LV e' medial:    5.66 cm/s  LV PW:  1.40 cm     LV E/e' medial:  13.9  LV IVS:        1.50 cm     LV e' lateral:   7.62 cm/s  LVOT diam:     2.00 cm     LV E/e' lateral: 10.3  LV SV:         61  LV SV Index:   33  LVOT Area:     3.14 cm     LV Volumes (MOD)  LV vol d, MOD A2C: 64.3 ml  LV vol d, MOD A4C: 72.5 ml  LV vol s, MOD A2C: 31.6 ml  LV vol s, MOD A4C: 26.7 ml  LV SV MOD A2C:     32.7 ml  LV SV MOD A4C:     72.5 ml  LV SV MOD BP:      39.2 ml   RIGHT VENTRICLE  RV Basal diam:  4.50 cm  RV Mid diam:    3.30 cm  RV S prime:     12.60 cm/s  TAPSE (M-mode): 1.7 cm   LEFT ATRIUM              Index        RIGHT ATRIUM           Index  LA diam:        4.60 cm  2.54 cm/m   RA Area:     19.10 cm  LA Vol (A2C):   87.1 ml  48.05 ml/m  RA Volume:   57.50 ml  31.72 ml/m  LA Vol (A4C):   108.0 ml 59.58 ml/m  LA Biplane Vol: 97.2 ml  53.62 ml/m   AORTIC VALVE                     PULMONIC VALVE  AV Area (Vmax):    0.69 cm      PV Vmax:          1.12 m/s  AV Area (Vmean):   0.69 cm      PV Peak grad:     5.0 mmHg  AV Area (VTI):     0.74 cm      PR End Diast Vel: 8.07 msec  AV Vmax:           369.50 cm/s  AV Vmean:          275.000 cm/s  AV VTI:            0.818 m  AV Peak Grad:      54.6 mmHg  AV Mean Grad:      32.5 mmHg  LVOT Vmax:         80.90 cm/s  LVOT Vmean:        60.800 cm/s  LVOT VTI:          0.193 m  LVOT/AV VTI ratio: 0.24    AORTA  Ao Root diam: 3.20 cm  Ao Asc diam:  3.60 cm   MITRAL VALVE               TRICUSPID VALVE  MV Area (PHT): 1.76 cm    TR Peak grad:   41.2 mmHg  MV Area VTI:   1.68 cm    TR Vmax:        321.00 cm/s  MV Peak grad:  5.4 mmHg  MV Mean grad:  1.0 mmHg    SHUNTS  MV Vmax:       1.16  m/s    Systemic VTI:  0.19 m  MV Vmean:      52.5 cm/s   Systemic Diam: 2.00 cm  MV Decel Time: 430 msec  MV E velocity: 78.40 cm/s  MV A velocity: 35.60 cm/s  MV E/A ratio:  2.20   Teddie Favre MD  Electronically signed by Teddie Favre MD  Signature Date/Time: 04/19/2023/12:54:48 PM        Final        Physicians   Panel Physicians Referring Physician Case Authorizing Physician  Kyra Phy, MD (Primary)        Procedures   RIGHT/LEFT HEART CATH AND CORONARY  ANGIOGRAPHY  ABDOMINAL AORTOGRAM    Conclusion       Mid Cx lesion is 40% stenosed.   1.  Minimal, nonobstructive coronary artery disease. 2.  Fick cardiac output of 7.3 L/min and Fick cardiac index of 4.0 L/min/m with the following hemodynamics:            Right atrial pressure mean of 9 mmHg            Right ventricular pressure 41/2 with an end-diastolic pressure of 9 mmHg            Wedge pressure mean of 13 mmHg with V waves to 22 mmHg            PA pressure 45/14 with a mean of 28 mmHg            PVR of 2.1 Woods units            PA pulsatility index of 3.4 3.  Capacious iliofemoral vessels bilaterally.   Recommendation: Continue evaluation for aortic valve intervention.   Indications   Nonrheumatic aortic valve stenosis [I35.0 (ICD-10-CM)]    Procedural Details   Technical Details The patient is an 89 year old male with a history of severe symptomatic aortic stenosis, atrial flutter status post ablation, left atrial appendage ligation, IV dye allergy, hypertension, hyperlipidemia, and CKD stage IIIa was seen in the outpatient setting due to lifestyle limiting dyspnea.  He is referred for right heart catheterization and coronary angiography study as part of evaluation for intervention for aortic valve stenosis.  After obtaining consent, the patient brought to the cardiac catheterization laboratory and prepped draped sterile fashion.  Xylocaine  was used to anesthetize the right wrist and a 6 [place] IMA glide sheath placed there.  5000 units heparin  and 5 mg verapamil  were administered through the sheath.  Previously placed antecubital IV was exchanged for a 5 [place] Terumo glide sheath.  A 6 [place] TIG catheter used for selective coronary angiography and left heart catheterization.  A 5 French balloontipped catheter was used for right heart catheterization.  A 5 French pigtail catheter was maneuvered to the distal abdominal aorta and abdominal aortography and bilateral iliofemoral  angiography was performed.  After review of the angiogram, no further interventions were pursued.  A TR band was placed.  There were no acute complications. Estimated blood loss <50 mL.   During this procedure medications were administered to achieve and maintain moderate conscious sedation while the patient's heart rate, blood pressure, and oxygen saturation were continuously monitored and I was present face-to-face 100% of this time. Burnie Cartwright Rad Tech  Jerelyn Money Cardiovascular Specialist and Alean Hunt Cardiovascular Specialist are independent, trained observers who assisted in the monitoring of the patient's level of consciousness.    Medications (Filter: Administrations occurring from 1716 to 1756 on 06/08/23) fentaNYL  (SUBLIMAZE ) injection (mcg)  Total dose: 25 mcg Date/Time Rate/Dose/Volume Action    06/08/23 1732 25 mcg Given    midazolam  (VERSED ) injection (mg)  Total dose: 1 mg Date/Time Rate/Dose/Volume Action    06/08/23 1732 1 mg Given    lidocaine  (PF) (XYLOCAINE ) 1 % injection (mL)  Total volume: 5 mL Date/Time Rate/Dose/Volume Action    06/08/23 1734 5 mL Given    Radial Cocktail/Verapamil  only (mL)  Total volume: 10 mL Date/Time Rate/Dose/Volume Action    06/08/23 1738 10 mL Given    heparin  sodium (porcine) injection (Units)  Total dose: 5,000 Units Date/Time Rate/Dose/Volume Action    06/08/23 1738 5,000 Units Given    Heparin  (Porcine) in NaCl 2000-0.9 UNIT/L-% SOLN (mL)  Total volume: 1,000 mL Date/Time Rate/Dose/Volume Action    06/08/23 1740 1,000 mL Given    iohexol  (OMNIPAQUE ) 350 MG/ML injection (mL)  Total volume: 60 mL Date/Time Rate/Dose/Volume Action    06/08/23 1750 60 mL Given    Heparin  (Porcine) in NaCl 1000-0.9 UT/500ML-% SOLN (mL)  Total volume: 500 mL Date/Time Rate/Dose/Volume Action    06/08/23 1750 500 mL Given      Sedation Time   Sedation Time Physician-1: 17 minutes 20 seconds Contrast        Administrations  occurring from 1716 to 1756 on 06/08/23:  Medication Name Total Dose  iohexol  (OMNIPAQUE ) 350 MG/ML injection 60 mL    Radiation/Fluoro   Fluoro time: 3.2 (min) DAP: 8.2 (Gycm2) Cumulative Air Kerma: 106.2 (mGy) Complications   Complications documented before study signed (06/08/2023  6:00 PM)    No complications were associated with this study.  Documented by Alinda Apley, RCIS - 06/08/2023  5:13 PM      Coronary Findings   Diagnostic Dominance: Right Left Anterior Descending  There is mild diffuse disease throughout the vessel.    Left Circumflex  The vessel exhibits minimal luminal irregularities.  Mid Cx lesion is 40% stenosed.    Right Coronary Artery  The vessel exhibits minimal luminal irregularities.    Intervention    No interventions have been documented.    Coronary Diagrams   Diagnostic Dominance: Right  Intervention    Implants    No implant documentation for this case.    Syngo Images    Show images for CARDIAC CATHETERIZATION Images on Long Term Storage    Show images for Jaruis, [place] to Procedure Log   Procedure Log    Hemodynamics   Pressures Phases Resting  Right      RA Mean  mmHg 9    RA A-Wave  mmHg 12    RA V-Wave  mmHg 10  Pulmonary      PA  mmHg 45/14 (28)    PCW Mean  mmHg 13.0    PCW A-Wave  mmHg 18.0    PCW V-Wave  mmHg 22.0    PAPi   3.4    Saturations Phases Resting    PA  % 74    Arterial  % 95    Hemo Data   Flowsheet Row Most Recent Value  Fick Cardiac Output 7.27 L/min  Fick Cardiac Output Index 4.02 (L/min)/BSA  Aortic Mean Gradient 31.52 mmHg  Aortic Peak Gradient 29.1 mmHg  Aortic Valve Area 1.39  Aortic Value Area Index 0.77 cm2/BSA  RA A Wave 12 mmHg  RA V Wave 10 mmHg  RA Mean 9 mmHg  RV Systolic Pressure 41 mmHg  RV Diastolic Pressure 2 mmHg  RV EDP 9 mmHg  PA Systolic Pressure 45 mmHg  PA Diastolic Pressure 14 mmHg  PA Mean 28 mmHg  PW A Wave 18 mmHg  PW V Wave 22  mmHg  PW Mean 13 mmHg  AO Systolic Pressure 137 mmHg  AO Diastolic Pressure 66 mmHg  AO Mean 96 mmHg  LV Systolic Pressure 169 mmHg  LV Diastolic Pressure 11 mmHg  LV EDP 18 mmHg  Arterial Occlusion Pressure Extended Systolic Pressure 140 mmHg  Arterial Occlusion Pressure Extended Diastolic Pressure 64 mmHg  Arterial Occlusion Pressure Extended Mean Pressure 96 mmHg  Left Ventricular Apex Extended Systolic Pressure 170 mmHg  LVp Diastolic Pressure 10 mmHg  Left Ventricular Apex Extended EDP Pressure 19 mmHg  QP/QS 0.81  TPVR Index 8.6 HRUI  TSVR Index 23.87 HRUI  PVR SVR Ratio 0.21  TPVR/TSVR Ratio 0.36    ADDENDUM REPORT: 07/12/2023 22:15   ADDENDUM: Addendum created by mistake.     Electronically Signed   By: Rozell Cornet M.D.   On: 07/12/2023 22:15    Addended by Philippa Bray, MD on 07/12/2023 10:17 PM  ADDENDUM REPORT: 07/12/2023 21:00   EXAM: OVER-READ INTERPRETATION  CT CHEST   The following report is an over-read performed by radiologist Dr. Laurier Poplin [facility] on 07/12/2023. This over-read does not include interpretation of cardiac or coronary anatomy or pathology. The coronary calcium score interpretation by the cardiologist is attached.   COMPARISON:  Same day CT chest abdomen pelvis   FINDINGS: Calcified mediastinal lymph nodes. Left atrial appendage occlusion device. No visualized focal consolidation, pleural effusion, or pneumothorax. No acute abnormality in the visualized upper abdomen. No acute osseous abnormality.   IMPRESSION: No acute abnormality. See separate report from same day CTA for more complete evaluation of the chest, abdomen, and pelvis.     Electronically Signed   By: Rozell Cornet M.D.   On: 07/12/2023 21:00    Addended by Philippa Bray, MD on 07/12/2023  9:03 PM    Study Result   Narrative & Impression  CLINICAL DATA:  Severe Aortic Stenosis.   EXAM: Cardiac TAVR CT   TECHNIQUE: A  non-contrast, gated CT scan was obtained with axial slices of 3 mm through the heart for aortic valve calcium scoring. A 120 kV retrospective, gated, contrast cardiac scan was obtained. Gantry rotation speed was 250 msecs and collimation was 0.6 mm. Nitroglycerin  was not given. The 3D data set was reconstructed in 5% intervals of the 0-95% of the R-R cycle. Systolic and diastolic phases were analyzed on a dedicated workstation using MPR, MIP, and VRT modes. The patient received 100 cc of contrast.   FINDINGS: Image quality: Excellent.   Noise artifact is: Limited.   Valve Morphology: Tricuspid aortic valve. Severely calcified leaflets with severely reduced movement in systole. Bulky calcification of the NCC.   Aortic Valve Calcium score: 2906   Aortic annular dimension:   Phase assessed: 25%   Annular area: 523 mm2   Annular perimeter: 83.1 mm   Max diameter: 29.1 mm   Min diameter: 23.7 mm   Annular and subannular calcification: Single, moderate protruding calcification under then NCC extending into the LVOT.   Membranous septum length: 8.7 mm   Optimal coplanar projection: LAO 12 CAU 16   Coronary Artery Height above Annulus:   Left Main: 16.0 mm   Right Coronary:18.6 mm   Sinus of Valsalva Measurements:   Non-coronary: 31 mm   Right-coronary: 31 mm   Left-coronary: 34 mm   Sinus of  Valsalva Height:   Non-coronary: 23.0 mm   Right-coronary: 19.0 mm   Left-coronary: 20.8 mm   Sinotubular Junction: 29 mm   Ascending Thoracic Aorta: 35 mm   Coronary Arteries: Normal coronary origin. Right dominance. The study was performed without use of NTG and is insufficient for plaque evaluation. Please refer to recent cardiac catheterization for coronary assessment.   Cardiac Morphology:   Right Atrium: Right atrial size is dilated.   Right Ventricle: The right ventricular cavity is dilated.   Left Atrium: Left atrial size is dilated. The LAA has  been surgically closed without residual connection.   Left Ventricle: The ventricular cavity size is within normal limits.   Pulmonary arteries: Normal in size without proximal filling defect.   Pulmonary veins: Normal pulmonary venous drainage.   Pericardium: Normal thickness with no significant effusion or calcium present.   Mitral Valve: The mitral valve is normal structure without significant calcification.   Extra-cardiac findings: See attached radiology report for non-cardiac structures.   IMPRESSION: 1. Annular measurements support a 26 mm S3. Borderline SoV for 34 mm Evolut Pro.   2. Single, moderate protruding calcification under then NCC extending into the LVOT.   3. Sufficient coronary to annulus distance.   4. Optimal Fluoroscopic Angle for Delivery: LAO 12 CAU 16   5. The LAA has been surgically closed without residual connection.   Melodee Spruce T. Rolm Clos, MD   Electronically Signed: By: Jackquelyn Mass M.D. On: 07/05/2023 14:07          Narrative & Impression  CLINICAL DATA:  Preop evaluation for TAVR.  Aortic atherosclerosis.   EXAM: CT ANGIOGRAPHY CHEST, ABDOMEN AND PELVIS   TECHNIQUE: Chest CT without contrast was not initially obtained.   Multidetector CT imaging through the chest, abdomen and pelvis was performed using the standard protocol during bolus administration of intravenous contrast. Multiplanar reconstructed images and MIPs were obtained and reviewed to evaluate the vascular anatomy.   RADIATION DOSE REDUCTION: This exam was performed according to the departmental dose-optimization program which includes automated exposure control, adjustment of the mA and/or kV according to patient size and/or use of iterative reconstruction technique.   CONTRAST:  95mL OMNIPAQUE  IOHEXOL  350 MG/ML SOLN   COMPARISON:  Chest CT without contrast 04/16/2022, CT abdomen and pelvis with IV contrast 01/07/2017, CT abdomen and pelvis without contrast  12/27/2016.   FINDINGS: CTA CHEST FINDINGS   Cardiovascular: There is moderate aortic patchy calcific plaque, scattered plaque in the great vessels.   There is no aneurysm, dissection or flow significant stenosis. Moderate calcifications and thickening noted in the aortic valve leaflets as well.   Representative thoracic aortic measurements are as follows:   Valve: 3.0 x 3.0 cm on 6:92 and 7:121;   Sinuses of Valsalva: 3.4 x 3.3 cm on 6:91 and 7:122, respectively;   Sinotubular junction: 2.6 x 2.2 cm on 6:94 and 7:121, respectively;   Mid ascending aorta: 3.4 x 3.3 cm on 6:97 and 7:112, respectively;   Mid arch: 2.9 x 2.9 cm on 4:54 and 6:87, respectively;   Isthmus: 2.3 cm on 7:117;   Proximal descending aorta at T6-7: 2.6 cm on 7:117;   Distal descending segment: 2.4 cm on 7:125;   Hiatal segment: 2.4 cm on 7:126.   There is increased moderate global cardiomegaly compared with the prior studies. Patchy three-vessel coronary artery calcifications. No pericardial effusion.   The pulmonary arteries are normal in caliber. There is no extension of the superior pulmonary veins.   Mediastinum/Nodes:  There are calcified bilateral hilar and precarinal lymph nodes.   No noncalcified thoracic adenopathy is seen. Axillary spaces are not fully included but clear where visible.   Thoracic trachea contains a mucoid septation and is otherwise unremarkable. The main bronchi are clear. The thoracic esophagus unremarkable.   There are multiple bilateral thyroid  nodules. Again, the largest 2 measure 2.2 cm on the right and 3.3 cm on the left, unchanged.   The most recent thyroid  ultrasound was 05/13/2020. Consider nonemergent ultrasound follow-up for reevaluation if clinically warranted given advanced age.   Lungs/Pleura: There is mild reticulated scarring at the lung apices. There are scattered linear scar-like opacities in the lung bases, mild posterior atelectasis. No  consolidation, effusion or suspicious lung nodule is seen.   Musculoskeletal: There is osteopenia, mild thoracic kyphodextroscoliosis and degenerative changes. No acute or suspicious osseous findings. The visualized chest wall with no mass.   Review of the MIP images confirms the above findings.   CTA ABDOMEN AND PELVIS FINDINGS   VASCULAR   Aorta: There are moderate patchy calcific plaques, scattered wall thrombus. No stenosis, dissection or aneurysm.   Celiac: Patent without evidence of aneurysm, dissection, vasculitis or significant stenosis.   There are nonstenosing ostial calcific plaques superiorly, patchy calcification splenic artery without stenosis or branch occlusion, trace calcification in the right hepatic artery.   SMA: Patent without evidence of aneurysm, dissection, vasculitis or significant stenosis. There are nonstenosing ostial calcific plaques.   Renals: 2 arteries supply the left kidney, single artery supplies the right. The single right renal artery is widely patent.   The nondominant left renal artery arises first and is widely patent. The dominant left renal artery arises 1 cm below this and has a 50% soft plaque origin stenosis and is otherwise clear.   IMA: Patent without evidence of aneurysm, dissection, vasculitis or significant stenosis.   Inflow: Patent without evidence of aneurysm, dissection, vasculitis or significant stenosis. There are scattered nonstenosing calcific plaques, iliac vessel tortuosity without significant ectasia.   Veins: No obvious venous abnormality within the limitations of this arterial phase study.   Review of the MIP images confirms the above findings.   NON-VASCULAR   Hepatobiliary: No focal liver abnormality is seen. Status post cholecystectomy. No biliary dilatation. There is a 1.4 cm rounded dense calcification posterior to the right lobe of the liver which is probably an extruded gallstone, alternatively a  calcified retroperitoneal lymph node.   Pancreas: No abnormality.   Spleen: No abnormality.   Adrenals/Urinary Tract: There is no adrenal mass. Bilateral renal sinus cysts are again noted.   There is no mass enhancement either side, no hydronephrosis bilaterally.   There is a 3 mm nonobstructive caliceal stone in the inferior pole on the left, a 4 mm nonobstructive caliceal stone right midpole, and an oval 6 x 3 mm distal left ureteral stone 1 cm proximal to the UVJ.   The bladder wall and lumen are unremarkable.   Stomach/Bowel: The stomach is contracted. Small bowel and appendix are normal caliber.   There is diffuse colonic diverticulosis, without evidence of diverticulitis. There is a colocolic surgical anastomosis new since 2018 at the rectosigmoid junction.   Multiple small bowel segments are contained within a sizable new left inguinal hernia sac, incompletely visualized.   The hernia mouth is 4 cm wide. The largest visualized portion of the hernia sac is 8 x 5.4 x 9.6 cm and displaces the root of the penis to the right.   There are  no findings of hernia incarceration in the visualized portion of the hernia and no upstream bowel dilatation.   Lymphatic: No adenopathy is seen.   Reproductive: Prostate brachytherapy seeds are again noted. No prostatomegaly. In   Other: None.   Musculoskeletal: Moderate levorotary lumbar scoliosis and advanced degenerative changes of the lumbar spine. Acquired spinal stenosis L4-5.   Review of the MIP images confirms the above findings.   IMPRESSION: 1. Aortic, aortic valve leaflet, and coronary artery atherosclerosis. No aneurysm, dissection or flow significant stenosis. Representative measurements of the thoracic aorta as above. 2. Increased moderate global cardiomegaly. 3. 50% soft plaque origin stenosis of the dominant left renal artery. No other visceral artery stenosis. 4. Multiple bilateral thyroid  nodules,  unchanged. Consider nonemergent follow-up ultrasound if clinically warranted given advanced age. 5. Sizable new left inguinal hernia sac containing multiple small bowel segments, incompletely visualized. No findings of hernia incarceration or upstream bowel dilatation. 6. Colonic diverticulosis without evidence of diverticulitis. 7. Osteopenia, scoliosis and degenerative change. 8. Old prostate brachytherapy. Interval new rectosigmoid colocolic surgical anastomosis. 9. 6 x 3 mm oval distal left ureteral stone 1 cm from the UVJ. No upstream hydronephrosis is seen. 10. Nonobstructive nephrolithiasis. 11. These results will be called to the ordering clinician or representative by the Radiologist Assistant, and communication documented in the PACS or [employer].     Electronically Signed   By: Denman Fischer M.D.   On: 07/12/2023 21:26      Impression:   This 89 year old gentleman has stage D3, severe, symptomatic aortic stenosis with NYHA class II symptoms that are primarily lower extremity fatigue with exertion.  I have personally reviewed his 2D echocardiogram, cardiac catheterization, and CTA studies.  His echo shows a severely calcified and thickened aortic valve with restricted leaflet mobility.  The mean gradient is 33 mmHg with a peak of 55 mmHg and a valve area of 0.74 cm, DVI of 0.24, and low stroke-volume index of 33 mmHg consistent with severe low-flow/low gradient aortic stenosis.  His ejection fraction is 60 to 65% with moderate concentric LVH and grade 2 diastolic dysfunction.  Cardiac catheterization shows minimal nonobstructive coronary disease.  I agree that aortic valve replacement is indicated in this patient for relief of his symptoms and to prevent progressive left ventricular dysfunction.  He also has an enlarging left groin hernia which is going to require surgical treatment which will need to be delayed until his valve is replaced.  Given his advanced age and  frailty I do not think he is a candidate for open surgical aortic valve replacement but transcatheter aortic valve replacement would be a reasonable option.  His gated cardiac CTA shows anatomy suitable for TAVR using a 26 mm SAPIEN 3 valve.  His abdominal and pelvic CTA shows adequate pelvic vascular anatomy to allow transfemoral insertion although his hernia does extend down into the left groin.  We may be able to displace this cephalad to allow left groin exposure or alternatively we could insert his pigtail through the right radial artery.   The patient and his daughter were counseled at length regarding treatment alternatives for management of severe symptomatic aortic stenosis. The risks and benefits of surgical intervention has been discussed in detail. Long-term prognosis with medical therapy was discussed. Alternative approaches such as conventional surgical aortic valve replacement, transcatheter aortic valve replacement, and palliative medical therapy were compared and contrasted at length. This discussion was placed in the context of the patient's own specific clinical presentation and past medical  history. All of their questions have been addressed.    Following the decision to proceed with transcatheter aortic valve replacement, a discussion was held regarding what types of management strategies would be attempted intraoperatively in the event of life-threatening complications, including whether or not the patient would be considered a candidate for the use of cardiopulmonary bypass and/or conversion to open sternotomy for attempted surgical intervention.  Due to his advanced age, frailty, and other comorbidities I do not think he is a candidate for emergent sternotomy to manage any intraoperative complications.  The patient has been advised of a variety of complications that might develop including but not limited to risks of death, stroke, paravalvular leak, aortic dissection or other major  vascular complications, aortic annulus rupture, device embolization, cardiac rupture or perforation, mitral regurgitation, acute myocardial infarction, arrhythmia, heart block or bradycardia requiring permanent pacemaker placement, congestive heart failure, respiratory failure, renal failure, pneumonia, infection, other late complications related to structural valve deterioration or migration, or other complications that might ultimately cause a temporary or permanent loss of functional independence or other long term morbidity. The patient provides full informed consent for the procedure as described and all questions were answered.       Plan:   Transfemoral TAVR using a SAPIEN 3 valve.    Bartley Lightning, MD

## 2023-08-24 ENCOUNTER — Ambulatory Visit (HOSPITAL_COMMUNITY): Attending: Internal Medicine

## 2023-08-24 ENCOUNTER — Encounter (HOSPITAL_COMMUNITY)
Admission: RE | Disposition: A | Discharge status: Home / Self Care | Payer: Self-pay | Source: Home / Self Care | Attending: Internal Medicine

## 2023-08-24 ENCOUNTER — Encounter (HOSPITAL_COMMUNITY): Payer: Self-pay | Admitting: Internal Medicine

## 2023-08-24 ENCOUNTER — Inpatient Hospital Stay (HOSPITAL_COMMUNITY): Admitting: Anesthesiology

## 2023-08-24 ENCOUNTER — Other Ambulatory Visit: Payer: Self-pay

## 2023-08-24 ENCOUNTER — Inpatient Hospital Stay (HOSPITAL_COMMUNITY)
Admission: RE | Admit: 2023-08-24 | Discharge: 2023-08-25 | DRG: 267 | Disposition: A | Discharge status: Home / Self Care | Attending: Internal Medicine | Admitting: Internal Medicine

## 2023-08-24 ENCOUNTER — Inpatient Hospital Stay (HOSPITAL_COMMUNITY): Admitting: Physician Assistant

## 2023-08-24 DIAGNOSIS — Z833 Family history of diabetes mellitus: Secondary | ICD-10-CM | POA: Diagnosis not present

## 2023-08-24 DIAGNOSIS — Z79899 Other long term (current) drug therapy: Secondary | ICD-10-CM

## 2023-08-24 DIAGNOSIS — Z888 Allergy status to other drugs, medicaments and biological substances status: Secondary | ICD-10-CM

## 2023-08-24 DIAGNOSIS — K4090 Unilateral inguinal hernia, without obstruction or gangrene, not specified as recurrent: Secondary | ICD-10-CM | POA: Diagnosis present

## 2023-08-24 DIAGNOSIS — Z8249 Family history of ischemic heart disease and other diseases of the circulatory system: Secondary | ICD-10-CM

## 2023-08-24 DIAGNOSIS — Z91041 Radiographic dye allergy status: Secondary | ICD-10-CM | POA: Diagnosis not present

## 2023-08-24 DIAGNOSIS — C61 Malignant neoplasm of prostate: Secondary | ICD-10-CM | POA: Diagnosis present

## 2023-08-24 DIAGNOSIS — I350 Nonrheumatic aortic (valve) stenosis: Secondary | ICD-10-CM | POA: Diagnosis not present

## 2023-08-24 DIAGNOSIS — Z882 Allergy status to sulfonamides status: Secondary | ICD-10-CM | POA: Diagnosis not present

## 2023-08-24 DIAGNOSIS — D6859 Other primary thrombophilia: Secondary | ICD-10-CM | POA: Diagnosis not present

## 2023-08-24 DIAGNOSIS — G40909 Epilepsy, unspecified, not intractable, without status epilepticus: Secondary | ICD-10-CM | POA: Diagnosis present

## 2023-08-24 DIAGNOSIS — E7800 Pure hypercholesterolemia, unspecified: Secondary | ICD-10-CM | POA: Diagnosis not present

## 2023-08-24 DIAGNOSIS — Z952 Presence of prosthetic heart valve: Secondary | ICD-10-CM

## 2023-08-24 DIAGNOSIS — Z86718 Personal history of other venous thrombosis and embolism: Secondary | ICD-10-CM

## 2023-08-24 DIAGNOSIS — I10 Essential (primary) hypertension: Secondary | ICD-10-CM | POA: Diagnosis present

## 2023-08-24 DIAGNOSIS — N1831 Chronic kidney disease, stage 3a: Secondary | ICD-10-CM | POA: Diagnosis not present

## 2023-08-24 DIAGNOSIS — G629 Polyneuropathy, unspecified: Secondary | ICD-10-CM | POA: Diagnosis present

## 2023-08-24 DIAGNOSIS — I129 Hypertensive chronic kidney disease with stage 1 through stage 4 chronic kidney disease, or unspecified chronic kidney disease: Secondary | ICD-10-CM | POA: Diagnosis present

## 2023-08-24 DIAGNOSIS — R54 Age-related physical debility: Secondary | ICD-10-CM | POA: Diagnosis not present

## 2023-08-24 DIAGNOSIS — Z006 Encounter for examination for normal comparison and control in clinical research program: Secondary | ICD-10-CM

## 2023-08-24 DIAGNOSIS — D6869 Other thrombophilia: Secondary | ICD-10-CM | POA: Diagnosis present

## 2023-08-24 DIAGNOSIS — G7000 Myasthenia gravis without (acute) exacerbation: Secondary | ICD-10-CM | POA: Diagnosis present

## 2023-08-24 DIAGNOSIS — E042 Nontoxic multinodular goiter: Secondary | ICD-10-CM | POA: Diagnosis present

## 2023-08-24 DIAGNOSIS — Z801 Family history of malignant neoplasm of trachea, bronchus and lung: Secondary | ICD-10-CM | POA: Diagnosis not present

## 2023-08-24 DIAGNOSIS — I480 Paroxysmal atrial fibrillation: Secondary | ICD-10-CM | POA: Diagnosis not present

## 2023-08-24 DIAGNOSIS — Z8546 Personal history of malignant neoplasm of prostate: Secondary | ICD-10-CM | POA: Diagnosis not present

## 2023-08-24 DIAGNOSIS — I4891 Unspecified atrial fibrillation: Secondary | ICD-10-CM | POA: Diagnosis not present

## 2023-08-24 HISTORY — PX: INTRAOPERATIVE TRANSTHORACIC ECHOCARDIOGRAM: SHX6523

## 2023-08-24 HISTORY — PX: CARDIAC VALVE REPLACEMENT: SHX585

## 2023-08-24 HISTORY — DX: Presence of prosthetic heart valve: Z95.2

## 2023-08-24 LAB — POCT I-STAT, CHEM 8
BUN: 22 mg/dL (ref 8–23)
Calcium, Ion: 1.22 mmol/L (ref 1.15–1.40)
Chloride: 108 mmol/L (ref 98–111)
Creatinine, Ser: 1 mg/dL (ref 0.61–1.24)
Glucose, Bld: 157 mg/dL — ABNORMAL HIGH (ref 70–99)
HCT: 32 % — ABNORMAL LOW (ref 39.0–52.0)
Hemoglobin: 10.9 g/dL — ABNORMAL LOW (ref 13.0–17.0)
Potassium: 3.9 mmol/L (ref 3.5–5.1)
Sodium: 139 mmol/L (ref 135–145)
TCO2: 20 mmol/L — ABNORMAL LOW (ref 22–32)

## 2023-08-24 LAB — ECHOCARDIOGRAM LIMITED
AR max vel: 1.44 cm2
AV Area VTI: 0.8 cm2
AV Area mean vel: 0.79 cm2
AV Mean grad: 17.5 mmHg
AV Peak grad: 7.2 mmHg
Ao pk vel: 1.34 m/s
Area-P 1/2: 2.45 cm2
P 1/2 time: 516 ms
S' Lateral: 3.6 cm

## 2023-08-24 LAB — POCT ACTIVATED CLOTTING TIME
Activated Clotting Time: 233 s
Activated Clotting Time: 89 s

## 2023-08-24 SURGERY — TRANSCATHETER AORTIC VALVE REPLACEMENT, TRANSFEMORAL (CATHLAB)
Anesthesia: Monitor Anesthesia Care

## 2023-08-24 MED ORDER — PROPOFOL 500 MG/50ML IV EMUL
INTRAVENOUS | Status: DC | PRN
  Administered 2023-08-24: 50 ug/kg/min via INTRAVENOUS

## 2023-08-24 MED ORDER — NOREPINEPHRINE 4 MG/250ML-% IV SOLN: 0.0000 ug/min | INTRAVENOUS | Status: DC

## 2023-08-24 MED ORDER — SODIUM CHLORIDE 0.9 % IV SOLN: INTRAVENOUS | Status: AC

## 2023-08-24 MED ORDER — CHLORHEXIDINE GLUCONATE 0.12 % MT SOLN
15.0000 mL | Freq: Once | OROMUCOSAL | Status: AC
  Administered 2023-08-24: 15 mL via OROMUCOSAL
  Filled 2023-08-24: qty 15

## 2023-08-24 MED ORDER — OXYCODONE HCL 5 MG PO TABS
5.0000 mg | ORAL_TABLET | ORAL | Status: DC | PRN
  Administered 2023-08-24: 5 mg via ORAL
  Filled 2023-08-24: qty 1

## 2023-08-24 MED ORDER — IODIXANOL 320 MG/ML IV SOLN
INTRAVENOUS | Status: DC | PRN
  Administered 2023-08-24: 85 mL via INTRA_ARTERIAL

## 2023-08-24 MED ORDER — CHLORHEXIDINE GLUCONATE 4 % EX SOLN
30.0000 mL | CUTANEOUS | Status: DC
  Filled 2023-08-24: qty 30

## 2023-08-24 MED ORDER — LIDOCAINE HCL (PF) 1 % IJ SOLN
INTRAMUSCULAR | Status: DC | PRN
  Administered 2023-08-24: 20 mL

## 2023-08-24 MED ORDER — TRAMADOL HCL 50 MG PO TABS: 50.0000 mg | ORAL_TABLET | ORAL | Status: DC | PRN

## 2023-08-24 MED ORDER — PYRIDOSTIGMINE BROMIDE 60 MG PO TABS
60.0000 mg | ORAL_TABLET | Freq: Two times a day (BID) | ORAL | Status: DC
  Administered 2023-08-24 – 2023-08-25 (×2): 60 mg via ORAL
  Filled 2023-08-24 (×3): qty 1

## 2023-08-24 MED ORDER — SIMVASTATIN 20 MG PO TABS
10.0000 mg | ORAL_TABLET | Freq: Every day | ORAL | Status: DC
Start: 2023-08-24 — End: 2023-08-25
  Administered 2023-08-24: 10 mg via ORAL
  Filled 2023-08-24: qty 1

## 2023-08-24 MED ORDER — MORPHINE SULFATE (PF) 2 MG/ML IV SOLN: 1.0000 mg | INTRAVENOUS | Status: DC | PRN

## 2023-08-24 MED ORDER — DIPHENHYDRAMINE HCL 50 MG/ML IJ SOLN
50.0000 mg | INTRAMUSCULAR | Status: AC
  Administered 2023-08-24: 50 mg via INTRAVENOUS
  Filled 2023-08-24: qty 1

## 2023-08-24 MED ORDER — PROTAMINE SULFATE 10 MG/ML IV SOLN
INTRAVENOUS | Status: DC | PRN
  Administered 2023-08-24: 150 mg via INTRAVENOUS

## 2023-08-24 MED ORDER — SODIUM CHLORIDE 0.9% FLUSH
3.0000 mL | Freq: Two times a day (BID) | INTRAVENOUS | Status: DC
  Administered 2023-08-25: 3 mL via INTRAVENOUS

## 2023-08-24 MED ORDER — LIDOCAINE HCL (PF) 1 % IJ SOLN
INTRAMUSCULAR | Status: AC
  Filled 2023-08-24: qty 30

## 2023-08-24 MED ORDER — SODIUM CHLORIDE 0.9 % IV SOLN: INTRAVENOUS | Status: DC

## 2023-08-24 MED ORDER — OMEGA-3-ACID ETHYL ESTERS 1 G PO CAPS
1.0000 g | ORAL_CAPSULE | Freq: Two times a day (BID) | ORAL | Status: DC
  Administered 2023-08-24 – 2023-08-25 (×2): 1 g via ORAL
  Filled 2023-08-24 (×2): qty 1

## 2023-08-24 MED ORDER — HEPARIN (PORCINE) IN NACL 1000-0.9 UT/500ML-% IV SOLN
INTRAVENOUS | Status: DC | PRN
  Administered 2023-08-24: 2000 mL via SURGICAL_CAVITY

## 2023-08-24 MED ORDER — FAMOTIDINE IN NACL 20-0.9 MG/50ML-% IV SOLN
20.0000 mg | INTRAVENOUS | Status: AC
  Administered 2023-08-24: 20 mg via INTRAVENOUS
  Filled 2023-08-24: qty 50

## 2023-08-24 MED ORDER — SODIUM CHLORIDE 0.9% FLUSH
3.0000 mL | INTRAVENOUS | Status: DC | PRN
Start: 2023-08-25 — End: 2023-08-25

## 2023-08-24 MED ORDER — ONDANSETRON HCL 4 MG/2ML IJ SOLN: 4.0000 mg | Freq: Four times a day (QID) | INTRAMUSCULAR | Status: DC | PRN

## 2023-08-24 MED ORDER — CHLORHEXIDINE GLUCONATE 4 % EX SOLN
60.0000 mL | Freq: Once | CUTANEOUS | Status: DC
  Filled 2023-08-24: qty 60

## 2023-08-24 MED ORDER — METHYLPREDNISOLONE SODIUM SUCC 125 MG IJ SOLR
40.0000 mg | INTRAMUSCULAR | Status: AC
  Administered 2023-08-24: 40 mg via INTRAVENOUS
  Filled 2023-08-24: qty 2

## 2023-08-24 MED ORDER — LEVETIRACETAM 500 MG PO TABS
500.0000 mg | ORAL_TABLET | Freq: Every day | ORAL | Status: DC
  Administered 2023-08-24 – 2023-08-25 (×2): 500 mg via ORAL
  Filled 2023-08-24 (×2): qty 1

## 2023-08-24 MED ORDER — AMIODARONE HCL 200 MG PO TABS
100.0000 mg | ORAL_TABLET | Freq: Every day | ORAL | Status: DC
  Administered 2023-08-24 – 2023-08-25 (×2): 100 mg via ORAL
  Filled 2023-08-24 (×2): qty 1

## 2023-08-24 MED ORDER — SODIUM CHLORIDE 0.9 % IV SOLN: 250.0000 mL | INTRAVENOUS | Status: DC | PRN

## 2023-08-24 MED ORDER — HEPARIN SODIUM (PORCINE) 1000 UNIT/ML IJ SOLN
INTRAMUSCULAR | Status: DC | PRN
Start: 2023-08-24 — End: 2023-08-24
  Administered 2023-08-24: 5000 [IU] via INTRAVENOUS
  Administered 2023-08-24: 10000 [IU] via INTRAVENOUS

## 2023-08-24 MED ORDER — CEFAZOLIN SODIUM-DEXTROSE 2-4 GM/100ML-% IV SOLN: 2.0000 g | Freq: Three times a day (TID) | INTRAVENOUS | Status: DC

## 2023-08-24 MED ORDER — OMEGA-3 FATTY ACIDS 1000 MG PO CAPS: 2.0000 g | ORAL_CAPSULE | Freq: Two times a day (BID) | ORAL | Status: DC

## 2023-08-24 MED ORDER — SODIUM CHLORIDE 0.9 % IV SOLN: INTRAVENOUS | Status: DC | PRN

## 2023-08-24 MED ORDER — NITROGLYCERIN IN D5W 200-5 MCG/ML-% IV SOLN: 0.0000 ug/min | INTRAVENOUS | Status: DC

## 2023-08-24 MED ORDER — CEFAZOLIN SODIUM-DEXTROSE 2-4 GM/100ML-% IV SOLN
2.0000 g | Freq: Three times a day (TID) | INTRAVENOUS | Status: AC
  Administered 2023-08-24 – 2023-08-25 (×2): 2 g via INTRAVENOUS
  Filled 2023-08-24 (×2): qty 100

## 2023-08-24 MED ORDER — ASPIRIN 81 MG PO TBEC
81.0000 mg | DELAYED_RELEASE_TABLET | Freq: Every day | ORAL | Status: DC
  Administered 2023-08-24 – 2023-08-25 (×2): 81 mg via ORAL
  Filled 2023-08-24 (×2): qty 1

## 2023-08-24 MED ORDER — TIZANIDINE HCL 4 MG PO TABS
4.0000 mg | ORAL_TABLET | Freq: Every day | ORAL | Status: DC
  Administered 2023-08-24: 4 mg via ORAL
  Filled 2023-08-24: qty 1

## 2023-08-24 MED ORDER — GABAPENTIN 300 MG PO CAPS
600.0000 mg | ORAL_CAPSULE | Freq: Every day | ORAL | Status: DC
Start: 2023-08-24 — End: 2023-08-25
  Administered 2023-08-24: 600 mg via ORAL
  Filled 2023-08-24: qty 2

## 2023-08-24 MED ORDER — ACETAMINOPHEN 650 MG RE SUPP: 650.0000 mg | Freq: Four times a day (QID) | RECTAL | Status: DC | PRN

## 2023-08-24 MED ORDER — ACETAMINOPHEN 325 MG PO TABS: 650.0000 mg | ORAL_TABLET | Freq: Four times a day (QID) | ORAL | Status: DC | PRN

## 2023-08-24 MED ORDER — PROPOFOL 10 MG/ML IV BOLUS
INTRAVENOUS | Status: DC | PRN
  Administered 2023-08-24 (×4): 20 mg via INTRAVENOUS

## 2023-08-24 SURGICAL SUPPLY — 31 items
BAG SNAP BAND KOVER 36X36 (MISCELLANEOUS) ×2 IMPLANT
BALLOON MUSTANG 10X40X135 (BALLOONS) IMPLANT
CABLE SURGICAL S-101-97-12 (CABLE) IMPLANT
CATH 26 ULTRA DELIVERY (CATHETERS) IMPLANT
CATH DIAG 6FR PIGTAIL ANGLED (CATHETERS) IMPLANT
CATH INFINITI 5FR ANG PIGTAIL (CATHETERS) IMPLANT
CATH INFINITI 6F AL1 (CATHETERS) IMPLANT
CLOSURE MYNX CONTROL 6F/7F (Vascular Products) IMPLANT
CLOSURE PERCLOSE PROSTYLE (VASCULAR PRODUCTS) IMPLANT
CRIMPER (MISCELLANEOUS) IMPLANT
DEVICE INFLATION ATRION QL2530 (MISCELLANEOUS) IMPLANT
DEVICE TORQUE .014-.018 (MISCELLANEOUS) IMPLANT
ELECT DEFIB PAD ADLT CADENCE (PAD) IMPLANT
GLIDESHEATH SLEND SS 6F .021 (SHEATH) IMPLANT
KIT SAPIAN 3 ULTRA RESILIA 26 (Valve) IMPLANT
PACK CARDIAC CATHETERIZATION (CUSTOM PROCEDURE TRAY) ×1 IMPLANT
SET ATX-X65L (MISCELLANEOUS) IMPLANT
SHEATH INTRODUCER SET 20-26 (SHEATH) IMPLANT
SHEATH PINNACLE 6F 10CM (SHEATH) IMPLANT
SHEATH PINNACLE 8F 10CM (SHEATH) IMPLANT
SHEATH PROBE COVER 6X72 (BAG) IMPLANT
STOPCOCK MORSE 400PSI 3WAY (MISCELLANEOUS) ×2 IMPLANT
TRANSDUCER W/STOPCOCK (MISCELLANEOUS) IMPLANT
TUBING ART PRESS 72 MALE/FEM (TUBING) IMPLANT
TUBING CIL FLEX 10 FLL-RA (TUBING) IMPLANT
WIRE AMPLATZ SS-J .035X180CM (WIRE) IMPLANT
WIRE EMERALD 3MM-J .035X150CM (WIRE) IMPLANT
WIRE EMERALD 3MM-J .035X260CM (WIRE) IMPLANT
WIRE EMERALD ST .035X260CM (WIRE) IMPLANT
WIRE MICRO SET SILHO 5FR 7 (SHEATH) IMPLANT
WIRE SAFARI SM CURVE 275 (WIRE) IMPLANT

## 2023-08-24 NOTE — Anesthesia Postprocedure Evaluation (Deleted)
 Anesthesia Post Note  Patient: Kyle [name].  Procedure(s) Performed: Transcatheter Aortic Valve Replacement, Transfemoral ECHOCARDIOGRAM, TRANSTHORACIC     Patient location during evaluation: PACU Anesthesia Type: MAC Level of consciousness: awake and alert Pain management: pain level controlled Vital Signs Assessment: post-procedure vital signs reviewed and stable Respiratory status: spontaneous breathing Cardiovascular status: stable Anesthetic complications: no   There were no known notable events for this encounter.  Last Vitals:  Vitals:   08/24/23 1603 08/24/23 1622  BP:    Pulse: 64   Resp: 13   Temp:  (!) 35.6 C  SpO2:      Last Pain:  Vitals:   08/24/23 1622  TempSrc: Temporal  PainSc: Asleep                 Gorman Laughter

## 2023-08-24 NOTE — Op Note (Signed)
 HEART AND VASCULAR CENTER  TAVR OPERATIVE NOTE   Date of Procedure:  08/24/2023  Preoperative Diagnosis: Severe Aortic Stenosis   Postoperative Diagnosis: Same   Procedure:   Transcatheter Aortic Valve Replacement - Transfemoral Approach     Edwards Sapien 3 Ultra Resilia THV (size 26 mm, model # 9755RSL, serial # [id number])              Co-Surgeons:                        Bartley Lightning, MD and Alyssa Backbone, MD     Anesthesiologist:                  Toy Freund, MD   Echocardiographer:              Junior Olea, MD   Pre-operative Echo Findings: Severe aortic stenosis Normal left ventricular systolic function   Post-operative Echo Findings:  Trace to mild paravalvular leak Normal left ventricular systolic function Left Heart Catheterization Findings: Left ventricular end-diastolic pressure of   BRIEF CLINICAL NOTE AND INDICATIONS FOR SURGERY  The patient is an 89 year old male with a history of severe symptomatic aortic stenosis, hypertension, hyperlipidemia, CKD stage IIIa, paroxysmal atrial flutter status post multiple cardioversions, and left groin hernia requiring surgical repair who was referred for elective transcatheter aortic valve replacement with a 26 mm SAPIEN 3 valve from right transfemoral approach.  During the course of the patient's preoperative work up they have been evaluated comprehensively by a multidisciplinary team of specialists coordinated through the Multidisciplinary Heart Valve Clinic in the [facility].  They have been demonstrated to suffer from symptomatic severe aortic stenosis as noted above. The patient has been counseled extensively as to the relative risks and benefits of all options for the treatment of severe aortic stenosis including long term medical therapy, conventional surgery for aortic valve replacement, and transcatheter aortic valve replacement.  The patient has been independently evaluated by Dr.  Sherene Dilling with CT surgery and they are felt to be at high risk for conventional surgical aortic valve replacement. The surgeon indicated the patient would be a poor candidate for conventional surgery. Based upon review of all of the patient's preoperative diagnostic tests they are felt to be candidate for transcatheter aortic valve replacement using the transfemoral approach as an alternative to high risk conventional surgery.    Following the decision to proceed with transcatheter aortic valve replacement, a discussion has been held regarding what types of management strategies would be attempted intraoperatively in the event of life-threatening complications, including whether or not the patient would be considered a candidate for the use of cardiopulmonary bypass and/or conversion to open sternotomy for attempted surgical intervention.  The patient has been advised of a variety of complications that might develop peculiar to this approach including but not limited to risks of death, stroke, paravalvular leak, aortic dissection or other major vascular complications, aortic annulus rupture, device embolization, cardiac rupture or perforation, acute myocardial infarction, arrhythmia, heart block or bradycardia requiring permanent pacemaker placement, congestive heart failure, respiratory failure, renal failure, pneumonia, infection, other late complications related to structural valve deterioration or migration, or other complications that might ultimately cause a temporary or permanent loss of functional independence or other long term morbidity.  The patient provides full informed consent for the procedure as described and all questions were answered preoperatively.    DETAILS OF THE OPERATIVE PROCEDURE  PREPARATION:  The patient is brought to the operating room on the above mentioned date and central monitoring was established by the anesthesia team. The patient is placed in the supine position on the  operating table.  Intravenous antibiotics are administered. Conscious sedation is used.   Baseline transthoracic echocardiogram was performed. The patient's chest, abdomen, both groins, and both lower extremities are prepared and draped in a sterile manner. A time out procedure is performed.   PERIPHERAL ACCESS:   Using the modified Seldinger technique, femoral arterial and venous access were obtained with placement of a 6 Fr sheath in the left common femoral artery using u/s guidance.  A pigtail diagnostic catheter was passed through the femoral arterial sheath under fluoroscopic guidance into the aortic root.  Aortic root angiography was performed in order to determine the optimal angiographic angle for valve deployment.  TRANSFEMORAL ACCESS:  A micropuncture kit was used to gain access to the right common femoral artery using u/s guidance. Position confirmed with angiography. Pre-closure with double ProGlide closure devices. The patient was heparinized systemically and ACT verified > 250 seconds.    A 14 Fr transfemoral E-sheath was introduced into the right common femoral artery after progressively dilating over an Amplatz superstiff wire. An AL-1 catheter was used to direct a straight-tip exchange length wire across the native aortic valve into the left ventricle. This was exchanged out for a pigtail catheter and position was confirmed in the LV apex. Simultaneous left ventricular, aortic, and left ventricular end-diastolic pressures were recorded.  The pigtail catheter was then exchanged for an Safari wire in the LV apex.  Direct LV pacing thresholds were assessed and found to be adequate.   BALLOON AORTIC VALVULOPLASTY: Balloon aortic valvuloplasty with 10 mm balloon with direct left ventricular rapid pacing was performed without complications.  TRANSCATHETER HEART VALVE DEPLOYMENT:  An Edwards Sapien 3 THV (size 26 mm) was prepared and crimped per manufacturer's guidelines, and the proper  orientation of the valve is confirmed on the [employer] delivery system. The valve was advanced through the introducer sheath using normal technique until in an appropriate position in the abdominal aorta beyond the sheath tip. The balloon was then retracted and using the fine-tuning wheel was centered on the valve. The valve was then advanced across the aortic arch using appropriate flexion of the catheter. The valve was carefully positioned across the aortic valve annulus. The Commander catheter was retracted using normal technique. Once final position of the valve has been confirmed by angiographic assessment, the valve is deployed while temporarily holding ventilation and during rapid ventricular pacing to maintain systolic blood pressure < 50 mmHg and pulse pressure < 10 mmHg. The balloon inflation is held for >3 seconds after reaching full deployment volume. Once the balloon has fully deflated the balloon is retracted into the ascending aorta and valve function is assessed using TTE. There is felt to be mild to moderate paravalvular leak and no central aortic insufficiency.  Therefore post dilation with 1 extra cc was performed with direct left ventricular pacing.  This resulted in trace to mild paravalvular leak.  The deployment balloon and guidewire are both removed. Echo demostrated acceptable post-procedural gradients, stable mitral valve function, and no AI.   PROCEDURE COMPLETION:  The sheath was then removed and closure devices were completed. Protamine was administered once femoral arterial repair was complete. The temporary pacemaker, pigtail catheters and femoral sheaths were removed with a Mynx closure device placed in the artery and manual pressure used for venous hemostasis.  The patient tolerated the procedure well and is transported to the surgical intensive care in stable condition. There were no immediate intraoperative complications. All sponge instrument and needle counts are  verified correct at completion of the operation.   No blood products were administered during the operation.  The patient received a total of 85 mL of intravenous contrast during the procedure.  Brodee Mauritz K Rylen Swindler MD 08/24/2023 7:57 PM

## 2023-08-24 NOTE — Discharge Summary (Incomplete)
 HEART AND VASCULAR CENTER   MULTIDISCIPLINARY HEART VALVE TEAM  Discharge Summary    Patient ID: Kyle [name]. MRN: [id number]; DOB: August 02, 1934  Admit date: 08/24/2023 Discharge date: 08/25/2023  PCP:  Kyle Parkins, MD  [facility] Cardiologist:  Kyle Lander, MD  [facility] Structural heart: Kyle Phy, MD [facility] Electrophysiologist:  Kyle Sells, MD   Discharge Diagnoses    Principal Problem:   S/P TAVR (transcatheter aortic valve replacement) Active Problems:   Ocular Myasthenia (HCC)   Essential hypertension, benign   Peripheral neuropathy   Severe aortic stenosis   Acquired thrombophilia (HCC)   Hypercholesterolemia   Malignant tumor of prostate (HCC)   PAF (paroxysmal atrial fibrillation) (HCC)   Allergies Allergies  Allergen Reactions   Iodinated Contrast Media Hives     pt was premedicated/hives reaction occurred 24 hours post injection, Onset Date: [id number]    Sulfa Antibiotics Hives, Swelling and Rash   Bactericin [Bacitracin] Other (See Comments)    Unknown reaction    Norvasc  [Amlodipine ] Swelling    Leg swelling    Diagnostic Studies/Procedures    TAVR OPERATIVE NOTE     Date of Procedure:                08/24/2023   Preoperative Diagnosis:      Severe Aortic Stenosis    Postoperative Diagnosis:    Same    Procedure:        Transcatheter Aortic Valve Replacement - Percutaneous Right Transfemoral Approach             Edwards Sapien 3 Ultra Resilia THV (size 26 mm, model # 9755RSL, serial # [id number])              Co-Surgeons:                        Kyle Lightning, MD and Kyle Backbone, MD     Anesthesiologist:                  Kyle Freund, MD   Echocardiographer:              Kyle Olea, MD   Pre-operative Echo Findings: Severe aortic stenosis Normal left ventricular systolic function   Post-operative Echo Findings:  Trace to mild paravalvular leak Normal left ventricular systolic  function _____________    Echo 08/25/23: completed but pending formal read at the time of discharge   History of Present Illness     Kyle Brocks. is a 89 y.o. male with a history of DVT, HTN, HLD, CKD stage IIIa, seizure disorder, paroxysmal atrial flutter s/p multiple cardioversions, s/p thorascopic closure of LAA 04/2022, contrast dye allergy, left groin hernia needing surgical repair and severe AS who presented to [facility] for planned TAVR.   He has an enlarging left-sided hernia causing significant discomfort and will require surgical treatment. Echo 04/19/23 showed EF 60%, mod LVH and severe AS with a mean grad 32.5 mmHg, AVA 0.69 cm2, mild MR, mod TR. ETT 05/10/23 showed some evidence of exercise limitation with baseline diffuse mild ST depression that was slightly more prominent in recovery. [facility] 06/08/23 showed minimal, nonobstructive coronary artery disease and essentially normal heart pressures.  The patient was evaluated by the multidisciplinary valve team and felt to have severe, symptomatic aortic stenosis and to be a suitable candidate for TAVR, which was set up for 08/24/23.   Hospital Course     Consultants: none  Severe AS:  -- S/p successful TAVR with a 26 mm Edwards Sapien 3 Ultra Resilia THV via the TF approach on 08/24/23.  -- Post operative echo completed but pending formal read. -- Groin sites are stable.  -- ECG with sinus brady (his baseline) and no high grade heart block. -- Continued on home Asprin 81mg  daily.  -- Met with cardiac rehab to discuss CRP phase II.  -- Plan for discharge home today with close follow up in the outpatient setting.   Prolonged QT: -- QTc at .  -- Discussed with pharmacy who felt it was okay to continue Keppra  and amio. -- Will stop PRN promethazine .   HTN: -- Continue losartan  100mg  daily.  HLD: -- Continue simvastatin  10mg  daily.   PAF: -- S/p multiple ablations. -- S/p thorascopic closure of LAA 04/2022 and taken off  OAC. -- Continue amiodarone  100mg  daily with 200mg  on the weekends.  Left inguinal hernia: -- Surgical clearance will be addressed in the outpatient setting.  Thyroid  nodule: -- Pre TAVR CTs showed multiple bilateral thyroid  nodules, unchanged from previous. -- This will be discussed in the outpatient setting.  _____________  Discharge Vitals Blood pressure (!) 131/57, pulse 70, temperature (!) 97.4 F (36.3 C), temperature source Oral, resp. rate 20, height 5\' 7"  (1.702 m), weight 70 kg, SpO2 95%.  Filed Weights   08/24/23 1038 08/25/23 6578  Weight: 68.9 kg 70 kg     GEN: Well nourished, well developed in no acute distress NECK: No JVD CARDIAC: RRR, no murmurs, rubs, gallops RESPIRATORY:  Clear to auscultation without rales, wheezing or rhonchi  ABDOMEN: Soft, non-tender, non-distended EXTREMITIES:  No edema; No deformity.  Groin sites clear without hematoma or ecchymosis.    Disposition   Pt is being discharged home today in good condition.  Follow-up Plans & Appointments     Follow-up Information     Kyle Kraft, PA-C. Go on 08/30/2023.   Specialties: Cardiology, Radiology Why: @ 9:05am, please arrive at least 15 minutes early. Contact information: [address] [place] [address] [phone]                  Discharge Medications   Allergies as of 08/25/2023       Reactions   Iodinated Contrast Media Hives    pt was premedicated/hives reaction occurred 24 hours post injection, Onset Date: [id number]   Sulfa Antibiotics Hives, Swelling, Rash   Bactericin [bacitracin] Other (See Comments)   Unknown reaction    Norvasc  [amlodipine ] Swelling   Leg swelling        Medication List     STOP taking these medications    famotidine  20 MG tablet Commonly known as: PEPCID    predniSONE  50 MG tablet Commonly known as: DELTASONE    promethazine  12.5 MG tablet Commonly known as: PHENERGAN        TAKE these medications     amiodarone  200 MG tablet Commonly known as: PACERONE  Take 200 mg Daily on Saturday, Sunday Take 100 mg Daily Monday -Friday   aspirin  EC 81 MG tablet Take 1 tablet (81 mg total) by mouth daily. Swallow whole.   cholecalciferol 25 MCG (1000 UNIT) tablet Commonly known as: VITAMIN D3 Take 2,000 Units by mouth 2 (two) times daily.   clobetasol 0.05 % external solution Commonly known as: TEMOVATE Apply 1 Application topically 2 (two) times daily as needed (blisters).   clobetasol cream 0.05 % Commonly known as: TEMOVATE Apply 1 Application topically daily as needed (blisters).  fish oil-omega-3 fatty acids  1000 MG capsule Take 2 g by mouth 2 (two) times daily.   Flaxseed Oil 1200 MG Caps Take 1,200 mg by mouth daily.   gabapentin  600 MG tablet Commonly known as: NEURONTIN  Take 1 tablet (600 mg total) by mouth daily. What changed: when to take this   GARLIQUE PO Take 1 tablet by mouth daily.   HYDROcodone -acetaminophen  5-325 MG tablet Commonly known as: NORCO/VICODIN Take 1 tablet by mouth 2 (two) times daily as needed for moderate pain (pain score 4-6).   levETIRAcetam  500 MG tablet Commonly known as: KEPPRA  Take 1/2 tablet in the morning, take 1 tablet in the evening   losartan  100 MG tablet Commonly known as: COZAAR  Take 1 tablet (100 mg total) by mouth daily.   MAGNESIUM  CITRATE PO Take 400 mg by mouth at bedtime. Chew   niacinamide  500 MG tablet Take 500 mg by mouth 3 (three) times daily.   Polyethyl Glycol-Propyl Glycol 0.4-0.3 % Soln Place 1 drop into both eyes 3 (three) times daily.   pyridostigmine  60 MG tablet Commonly known as: MESTINON  Take 1 tablet (60 mg total) by mouth 3 (three) times daily as needed. What changed: when to take this   simvastatin  20 MG tablet Commonly known as: ZOCOR  Take 10 mg by mouth at bedtime.   tiZANidine  4 MG tablet Commonly known as: ZANAFLEX  Take 4 mg by mouth at bedtime.   triamcinolone  0.025 %  ointment Commonly known as: KENALOG  Apply 1 Application topically daily as needed.        Outstanding Labs/Studies   none ______________________  Duration of Discharge Encounter: APP Time: 20 minutes    Signed, Abagail Hoar, PA-C 08/25/2023, 1:06 PM [phone] '   ATTENDING ATTESTATION:  After conducting a review of all available clinical information with the care team, interviewing the patient, and performing a physical exam, I agree with the findings and plan described in this note.   GEN: No acute distress.   HEENT:  MMM, no JVD, no scleral icterus Cardiac: RRR, no murmurs, rubs, or gallops.  Respiratory: Clear to auscultation bilaterally. GI: Soft, nontender, non-distended  MS: No edema; No deformity. Neuro:  Nonfocal  Vasc:  +2 radial pulses  Patient doing well after uncomplicated TAVR with a 26 mm SAPIEN 3 valve from the right transfemoral approach.  His access sites are stable, there are no signs of heart block, no signs of TIA or stroke, and we are awaiting his follow-up echocardiogram.  Plan on discharge today with close hospital follow-up.  I did discuss with the patient the importance of routine cardiology follow-up and the need for aspirin  81 mg after the TAVR procedure.  We did discuss delaying his hernia repair for a month but I would not be opposed to him having surgery within the few weeks if he is doing well at follow-up.  APP discharge time:20 MD discharge time:25  Kyle Backbone, MD Pager [phone]

## 2023-08-24 NOTE — Progress Notes (Signed)
  Echocardiogram 2D Echocardiogram has been performed.  Dione Franks 08/24/2023, 4:02 PM

## 2023-08-24 NOTE — Anesthesia Procedure Notes (Signed)
 Procedure Name: MAC Date/Time: 08/24/2023 2:48 PM  Performed by: Alphia Jasmine, CRNAPre-anesthesia Checklist: Patient identified, Emergency Drugs available, Suction available, Patient being monitored and Timeout performed Patient Re-evaluated:Patient Re-evaluated prior to induction Oxygen Delivery Method: Simple face mask

## 2023-08-24 NOTE — Discharge Instructions (Signed)

## 2023-08-24 NOTE — Transfer of Care (Signed)
 Immediate Anesthesia Transfer of Care Note  Patient: Kyle [name].  Procedure(s) Performed: Transcatheter Aortic Valve Replacement, Transfemoral ECHOCARDIOGRAM, TRANSTHORACIC  Patient Location: Cath Lab  Anesthesia Type:MAC  Level of Consciousness: drowsy and patient cooperative  Airway & Oxygen Therapy: Patient Spontanous Breathing and Patient connected to face mask oxygen  Post-op Assessment: Report given to RN, Post -op Vital signs reviewed and stable, Patient moving all extremities, Patient moving all extremities X 4, and Patient able to stick tongue midline  Post vital signs: Reviewed and stable  Last Vitals:  Vitals Value Taken Time  BP    Temp    Pulse 54 08/24/23 1623  Resp 15 08/24/23 1623  SpO2 98 % 08/24/23 1623  Vitals shown include unfiled device data.  Last Pain:  Vitals:   08/24/23 1602  TempSrc:   PainSc: Asleep         Complications: There were no known notable events for this encounter.

## 2023-08-24 NOTE — Progress Notes (Signed)
 Pt arrived from cath lab. Oriented to unit, CHG bath, VSS, pt aware he is on bedrest ill 8 pm. Family from waiting room and wife will spend the night. Bilateral groin sites level 0. Pt has noted scrotal swelling. Pt has noted hernia. Benna Brasher, RN

## 2023-08-24 NOTE — Op Note (Signed)
 HEART AND VASCULAR CENTER   MULTIDISCIPLINARY HEART VALVE TEAM   TAVR OPERATIVE NOTE   Date of Procedure:  08/24/2023  Preoperative Diagnosis: Severe Aortic Stenosis   Postoperative Diagnosis: Same   Procedure:   Transcatheter Aortic Valve Replacement - Percutaneous Right Transfemoral Approach  Edwards Sapien 3 Ultra Resilia THV (size 26 mm, model # 9755RSL, serial # [id number])   Co-Surgeons:  Bartley Lightning, MD and Alyssa Backbone, MD   Anesthesiologist:  Toy Freund, MD  Echocardiographer:  Junior Olea, MD  Pre-operative Echo Findings: Severe aortic stenosis Normal left ventricular systolic function  Post-operative Echo Findings:  Trace to mild paravalvular leak Normal left ventricular systolic function   BRIEF CLINICAL NOTE AND INDICATIONS FOR SURGERY  This 89 year old gentleman has stage D3, severe, symptomatic aortic stenosis with NYHA class II symptoms that are primarily lower extremity fatigue with exertion.  I have personally reviewed his 2D echocardiogram, cardiac catheterization, and CTA studies.  His echo shows a severely calcified and thickened aortic valve with restricted leaflet mobility.  The mean gradient is 33 mmHg with a peak of 55 mmHg and a valve area of 0.74 cm, DVI of 0.24, and low stroke-volume index of 33 mmHg consistent with severe low-flow/low gradient aortic stenosis.  His ejection fraction is 60 to 65% with moderate concentric LVH and grade 2 diastolic dysfunction.  Cardiac catheterization shows minimal nonobstructive coronary disease.  I agree that aortic valve replacement is indicated in this patient for relief of his symptoms and to prevent progressive left ventricular dysfunction.  He also has an enlarging left groin hernia which is going to require surgical treatment which will need to be delayed until his valve is replaced.  Given his advanced age and frailty I do not think he is a candidate for open surgical aortic valve replacement but  transcatheter aortic valve replacement would be a reasonable option.  His gated cardiac CTA shows anatomy suitable for TAVR using a 26 mm SAPIEN 3 valve.  His abdominal and pelvic CTA shows adequate pelvic vascular anatomy to allow transfemoral insertion although his hernia does extend down into the left groin.  We may be able to displace this cephalad to allow left groin exposure or alternatively we could insert his pigtail through the right radial artery.   The patient and his daughter were counseled at length regarding treatment alternatives for management of severe symptomatic aortic stenosis. The risks and benefits of surgical intervention has been discussed in detail. Long-term prognosis with medical therapy was discussed. Alternative approaches such as conventional surgical aortic valve replacement, transcatheter aortic valve replacement, and palliative medical therapy were compared and contrasted at length. This discussion was placed in the context of the patient's own specific clinical presentation and past medical history. All of their questions have been addressed.    Following the decision to proceed with transcatheter aortic valve replacement, a discussion was held regarding what types of management strategies would be attempted intraoperatively in the event of life-threatening complications, including whether or not the patient would be considered a candidate for the use of cardiopulmonary bypass and/or conversion to open sternotomy for attempted surgical intervention.  Due to his advanced age, frailty, and other comorbidities I do not think he is a candidate for emergent sternotomy to manage any intraoperative complications.  The patient has been advised of a variety of complications that might develop including but not limited to risks of death, stroke, paravalvular leak, aortic dissection or other major vascular complications, aortic annulus rupture, device embolization, cardiac  rupture or  perforation, mitral regurgitation, acute myocardial infarction, arrhythmia, heart block or bradycardia requiring permanent pacemaker placement, congestive heart failure, respiratory failure, renal failure, pneumonia, infection, other late complications related to structural valve deterioration or migration, or other complications that might ultimately cause a temporary or permanent loss of functional independence or other long term morbidity. The patient provides full informed consent for the procedure as described and all questions were answered.       DETAILS OF THE OPERATIVE PROCEDURE  PREPARATION:    The patient was brought to the operating room on the above mentioned date and appropriate monitoring was established by the anesthesia team. The patient was placed in the supine position on the operating table.  Intravenous antibiotics were administered. The patient was monitored closely throughout the procedure under conscious sedation.   Baseline transthoracic echocardiogram was performed. The patient's abdomen and both groins were prepped and draped in a sterile manner. A time out procedure was performed.   PERIPHERAL ACCESS:    Using the modified Seldinger technique, femoral arterial access was obtained with placement of a 6 Fr sheath on the left side.  A pigtail diagnostic catheter was passed through the left arterial sheath under fluoroscopic guidance into the aortic root. Aortic root angiography was performed in order to determine the optimal angiographic angle for valve deployment.   TRANSFEMORAL ACCESS:   Percutaneous transfemoral access and sheath placement was performed using ultrasound guidance.  The right common femoral artery was cannulated using a micropuncture needle and appropriate location was verified using hand injection angiogram.  A pair of Abbott Perclose percutaneous closure devices were placed and a 6 French sheath replaced into the femoral artery.  The patient was  heparinized systemically and ACT verified > 250 seconds.    A 14 Fr transfemoral E-sheath was introduced into the right common femoral artery after progressively dilating over an Amplatz superstiff wire. An AL-1 catheter was used to direct a straight-tip exchange length wire across the native aortic valve into the left ventricle. This was exchanged out for a pigtail catheter and position was confirmed in the LV apex. Simultaneous LV and Ao pressures were recorded.  Mean gradient was about 60 mm Hg. The pigtail catheter was exchanged for a Safari wire in the LV apex. Direct LV pacing threshold was tested via the [facility] wire and was satisfactory.   BALLOON AORTIC VALVULOPLASTY:   Balloon aortic valvuloplasty was performed using a 10 mm Mustang balloon.  Once optimal position was achieved, BAV was done under rapid ventricular pacing. The patient recovered well hemodynamically.    TRANSCATHETER HEART VALVE DEPLOYMENT:   An Edwards Sapien 3 Ultra transcatheter heart valve (size 26 mm) was prepared and crimped per manufacturer's guidelines, and the proper orientation of the valve is confirmed on the [employer] delivery system. The valve was advanced through the introducer sheath using normal technique until in an appropriate position in the abdominal aorta beyond the sheath tip. The balloon was then retracted and using the fine-tuning wheel was centered on the valve. The valve was then advanced across the aortic arch using appropriate flexion of the catheter. The valve was carefully positioned across the aortic valve annulus. The Commander catheter was retracted using normal technique. Once final position of the valve has been confirmed by angiographic assessment, the valve is deployed during rapid ventricular pacing to maintain systolic blood pressure < 50 mmHg and pulse pressure < 10 mmHg. The balloon inflation is held for >3 seconds after reaching full deployment volume.  Once the balloon has fully  deflated the balloon is retracted into the ascending aorta and valve function is assessed using echocardiography. There is felt to be mild to moderate paravalvular leak and no central aortic insufficiency.  Therefore a post dilation was performed using an extra cc of volume. This improved to paravalvular leak to trace/mild. The patient's hemodynamic recovery following valve deployment is good.  The deployment balloon and guidewire are both removed.    PROCEDURE COMPLETION:   The sheath was removed and femoral artery closure performed.  Protamine was administered once femoral arterial repair was complete. The temporary pacemaker, pigtail catheter and femoral sheaths were removed with manual pressure used for venous hemostasis.  A Mynx femoral closure device was utilized following removal of the diagnostic sheath in the left femoral artery.  The patient tolerated the procedure well and is transported to the cath lab recovery area in stable condition. There were no immediate intraoperative complications. All sponge instrument and needle counts are verified correct at completion of the operation.   No blood products were administered during the operation.  The patient received a total of 85 mL of intravenous contrast during the procedure.   Bartley Lightning, MD 08/24/2023

## 2023-08-25 ENCOUNTER — Encounter (HOSPITAL_COMMUNITY): Payer: Self-pay | Admitting: Internal Medicine

## 2023-08-25 ENCOUNTER — Inpatient Hospital Stay (HOSPITAL_COMMUNITY)

## 2023-08-25 DIAGNOSIS — Z952 Presence of prosthetic heart valve: Secondary | ICD-10-CM

## 2023-08-25 LAB — CBC
HCT: 30.9 % — ABNORMAL LOW (ref 39.0–52.0)
Hemoglobin: 10.6 g/dL — ABNORMAL LOW (ref 13.0–17.0)
MCH: 32.1 pg (ref 26.0–34.0)
MCHC: 34.3 g/dL (ref 30.0–36.0)
MCV: 93.6 fL (ref 80.0–100.0)
Platelets: 135 10*3/uL — ABNORMAL LOW (ref 150–400)
RBC: 3.3 MIL/uL — ABNORMAL LOW (ref 4.22–5.81)
RDW: 13.2 % (ref 11.5–15.5)
WBC: 12.7 10*3/uL — ABNORMAL HIGH (ref 4.0–10.5)
nRBC: 0 % (ref 0.0–0.2)

## 2023-08-25 LAB — BASIC METABOLIC PANEL WITH GFR
Anion gap: 7 (ref 5–15)
BUN: 25 mg/dL — ABNORMAL HIGH (ref 8–23)
CO2: 21 mmol/L — ABNORMAL LOW (ref 22–32)
Calcium: 8.4 mg/dL — ABNORMAL LOW (ref 8.9–10.3)
Chloride: 109 mmol/L (ref 98–111)
Creatinine, Ser: 1.09 mg/dL (ref 0.61–1.24)
GFR, Estimated: >60 mL/min (ref >60)
Glucose, Bld: 136 mg/dL — ABNORMAL HIGH (ref 70–99)
Potassium: 3.9 mmol/L (ref 3.5–5.1)
Sodium: 137 mmol/L (ref 135–145)

## 2023-08-25 LAB — ECHOCARDIOGRAM COMPLETE
AR max vel: 1.33 cm2
AV Area VTI: 1.67 cm2
AV Area mean vel: 1.48 cm2
AV Mean grad: 4.4 mmHg
AV Peak grad: 8.8 mmHg
Ao pk vel: 1.48 m/s
Area-P 1/2: 3.77 cm2
Calc EF: 69 %
Height: 67 in
MV VTI: 1.93 cm2
S' Lateral: 2.5 cm
Single Plane A2C EF: 71.1 %
Single Plane A4C EF: 66.1 %
Weight: 2469.15 [oz_av]

## 2023-08-25 LAB — MAGNESIUM: Magnesium: 1.8 mg/dL (ref 1.7–2.4)

## 2023-08-25 NOTE — Progress Notes (Signed)
  Echocardiogram 2D Echocardiogram has been performed.  Shaletta Hinostroza L Jasime Westergren RDCS 08/25/2023, 1:07 PM

## 2023-08-25 NOTE — Progress Notes (Signed)
 Kyle [name]. to be D/C'd Home per MD order.  Discussed with the patient and all questions fully answered.  VSS, Skin clean, dry and intact without evidence of skin break down, no evidence of skin tears noted. IV catheter discontinued intact. Site without signs and symptoms of complications. Dressing and pressure applied.  An After Visit Summary was printed and given to the patient. Patient received prescription.  D/c education completed with patient/family including follow up instructions, medication list, d/c activities limitations if indicated, with other d/c instructions as indicated by MD - patient able to verbalize understanding, all questions fully answered.   Patient instructed to return to ED, call 911, or call MD for any changes in condition.   Patient escorted via WC, and D/C home via private auto.  Barton Like Orin Eberwein 08/25/2023 2:57 PM

## 2023-08-25 NOTE — Progress Notes (Signed)
 Discussed with pt and wife restrictions, walking as tolerated, and CRPII. Pt receptive but not interested in CRPII. He likes to work in his yard. [id number] German Koller BS, ACSM-CEP 08/25/2023 9:49 AM

## 2023-08-25 NOTE — Progress Notes (Signed)
 Attempted Echocardiogram, patient needed to use the restroom. I waited 10 minutes and he explained that he will be a while and patient care assistance is needed. Will attempt at a later time.

## 2023-08-25 NOTE — Anesthesia Postprocedure Evaluation (Signed)
 Anesthesia Post Note  Patient: Kyle [name].  Procedure(s) Performed: Transcatheter Aortic Valve Replacement, Transfemoral ECHOCARDIOGRAM, TRANSTHORACIC     Patient location during evaluation: PACU Anesthesia Type: MAC Level of consciousness: awake and alert Pain management: pain level controlled Vital Signs Assessment: post-procedure vital signs reviewed and stable Respiratory status: spontaneous breathing Cardiovascular status: stable Anesthetic complications: no   There were no known notable events for this encounter.  Last Vitals:  Vitals:   08/25/23 0632 08/25/23 0637  BP: (!) 107/53   Pulse: (!) 45 (!) 57  Resp: 12 13  Temp:    SpO2:      Last Pain:  Vitals:   08/25/23 0817  TempSrc:   PainSc: 0-No pain                 Gorman Laughter

## 2023-08-25 NOTE — Progress Notes (Signed)
 Mobility Specialist Progress Note:    08/25/23 0944  Mobility  Activity Ambulated with assistance in room;Ambulated with assistance in hallway  Level of Assistance Contact guard assist, steadying assist  Assistive Device Cane  Distance Ambulated (ft) 260 ft  Activity Response Tolerated well  Mobility Referral Yes  Mobility visit 1 Mobility  Mobility Specialist Start Time (ACUTE ONLY) 0930  Mobility Specialist Stop Time (ACUTE ONLY) 0940  Mobility Specialist Time Calculation (min) (ACUTE ONLY) 10 min   Pt received in bed, agreeable to mobility session. Ambulated in hallway with Cane and CGA using gait belt. Tolerated well, no LOB during session. Returned pt to room, sitting up on EOB with all needs met. Wife at bedside, call bell in reach.    Kemuel Buchmann Mobility Specialist Please contact via Special educational needs teacher or  Rehab office at [phone]

## 2023-08-26 ENCOUNTER — Telehealth: Payer: Self-pay | Admitting: Physician Assistant

## 2023-08-26 NOTE — Telephone Encounter (Signed)
  HEART AND VASCULAR CENTER   MULTIDISCIPLINARY HEART VALVE TEAM   Patient contacted regarding discharge from [facility] on 08/25/23  Patient understands to follow up with a structural heart provider on 08/30/23 at [address] [facility]. Patient understands discharge instructions? yes Patient understands medications and regimen? yes Patient understands to bring all medications to this visit? yes  Abagail Hoar PA-C  [facility]

## 2023-08-26 NOTE — Progress Notes (Signed)
 HEART AND VASCULAR CENTER   MULTIDISCIPLINARY HEART VALVE CLINIC                                     Cardiology Office Note:    Date:  08/30/2023   ID:  Barnet Lias., DOB Aug 14, 1934, MRN [id number]  PCP:  Kathyleen Parkins, MD  [facility] Cardiologist:  Armida Lander, MD  [facility] Structural heart: Kyra Phy, MD [facility] Electrophysiologist:  Manya Sells, MD   Referring MD: Kathyleen Parkins, MD   [facility] s/p TAVR  History of Present Illness:    Kyle [name]. is a 89 y.o. male with a hx of DVT, HTN, HLD, CKD stage IIIa, seizure disorder, paroxysmal atrial flutter s/p multiple cardioversions, s/p thorascopic closure of LAA 04/2022, contrast dye allergy, left groin hernia needing surgical repair and severe AS s/p TAVR (08/24/23) who presents to clinic for follow up.   He has an enlarging left-sided hernia causing significant discomfort and will require surgical treatment. Echo 04/19/23 showed EF 60%, mod LVH and severe AS with a mean grad 32.5 mmHg, AVA 0.69 cm2, mild MR, mod TR. ETT 05/10/23 showed some evidence of exercise limitation with baseline diffuse mild ST depression that was slightly more prominent in recovery. [facility] 06/08/23 showed minimal, nonobstructive coronary artery disease and essentially normal heart pressures. S/p TAVR with a 26 mm Edwards Sapien 3 Ultra Resilia THV via the TF approach on 08/24/23. Post operative echo showed EF 65%, mod concentric LVH, mod RVE, normally functioning TAVR with a mean gradient of 4.4 mmHg and trivial to mild PVL as well as mild MR. QT noted to be mildly prolonged and PRN promethazine  was discontinued. He was continued on a baby aspirin  81mg  daily.   Today the patient presents to clinic for follow up. No CP or SOB. No LE edema, orthopnea or PND. No dizziness or syncope. No blood in stool or urine. No palpitations. Later in the day he gets fatigued.    Past Medical History:  Diagnosis Date   Arthritis    Atrial  fibrillation (HCC)    Auto immune neutropenia (HCC)    Bullous pemphigoid    Cancer (HCC)    Prostate   Cervical spondylosis without myelopathy 01/22/2016   Chronic kidney disease    Per MD note 05/2021 CKD stage non specified   Complication of anesthesia    difficult to wake up after anesthesia   DVT (deep venous thrombosis) (HCC)    a. Has had a previous right lower extremity DVT in the setting of hospitalization and surgery (after his brain tumor was resected in 1993) but is no longer on Coumadin.   Dyslipidemia    History of kidney stones    History of prostate cancer    Hypertension    Meningioma (HCC)    a. s/p resection 1990s.   Myasthenia (HCC) 02/14/2013   Nocturnal leg cramps    Ocular myasthenia gravis (HCC)    Peripheral neuropathy 11/23/2018   S/P TAVR (transcatheter aortic valve replacement) 08/24/2023   s/p TAVR with a 26 mm Edwards S3UR via the TF approach by Dr. Lorie Rook and Dr. Sherene Dilling   Seizures [facility])    Sinus bradycardia    Sleep paralysis    Thyroid  nodule, cold      Current Medications: Current Meds  Medication Sig   amiodarone  (PACERONE ) 200 MG tablet Take 200 mg Daily on  Saturday, Sunday Take 100 mg Daily Monday -Friday   amoxicillin  (AMOXIL ) 500 MG tablet Take 4 tablets (2,000 mg total) by mouth as directed. 1 hour prior to dental work including cleanings   aspirin  EC 81 MG tablet Take 1 tablet (81 mg total) by mouth daily. Swallow whole.   cholecalciferol (VITAMIN D3) 25 MCG (1000 UNIT) tablet Take 2,000 Units by mouth 2 (two) times daily.   clobetasol (TEMOVATE) 0.05 % external solution Apply 1 Application topically 2 (two) times daily as needed (blisters).   clobetasol cream (TEMOVATE) 0.05 % Apply 1 Application topically daily as needed (blisters).   fish oil-omega-3 fatty acids  1000 MG capsule Take 2 g by mouth 2 (two) times daily.   Flaxseed, Linseed, (FLAXSEED OIL) 1200 MG CAPS Take 1,200 mg by mouth daily.   gabapentin  (NEURONTIN ) 600 MG  tablet Take 1 tablet (600 mg total) by mouth daily. (Patient taking differently: Take 600 mg by mouth at bedtime.)   Garlic (GARLIQUE PO) Take 1 tablet by mouth daily.   HYDROcodone -acetaminophen  (NORCO/VICODIN) 5-325 MG tablet Take 1 tablet by mouth 2 (two) times daily as needed for moderate pain (pain score 4-6).   levETIRAcetam  (KEPPRA ) 500 MG tablet Take 1/2 tablet in the morning, take 1 tablet in the evening   MAGNESIUM  CITRATE PO Take 400 mg by mouth at bedtime. Chew   niacinamide  500 MG tablet Take 500 mg by mouth 3 (three) times daily.   Polyethyl Glycol-Propyl Glycol 0.4-0.3 % SOLN Place 1 drop into both eyes 3 (three) times daily.   pyridostigmine  (MESTINON ) 60 MG tablet Take 1 tablet (60 mg total) by mouth 3 (three) times daily as needed. (Patient taking differently: Take 60 mg by mouth in the morning and at bedtime.)   simvastatin  (ZOCOR ) 20 MG tablet Take 10 mg by mouth at bedtime.   tiZANidine  (ZANAFLEX ) 4 MG tablet Take 4 mg by mouth at bedtime.   triamcinolone  (KENALOG ) 0.025 % ointment Apply 1 Application topically daily as needed.      ROS:   Please see the history of present illness.    All other systems reviewed and are negative.  EKGs   EKG Interpretation Date/Time:  Monday Aug 30 2023 08:55:24 EDT Ventricular Rate:  50 PR Interval:  180 QRS Duration:  94 QT Interval:  514 QTC Calculation: 468 R Axis:   3  Text Interpretation: Sinus bradycardia with Premature atrial complexes Left ventricular hypertrophy with repolarization abnormality ( R in aVL ) When compared with ECG of 25-Aug-2023 04:33, Premature atrial complexes are now Present T wave inversion now evident in Anterior leads Confirmed by Abagail Hoar [phone]) on 08/30/2023 9:06:53 AM   Risk Assessment/Calculations:    CHA2DS2-VASc Score = 4   This indicates a 4.8% annual risk of stroke. The patient's score is based upon: CHF History: 0 HTN History: 1 Diabetes History: 0 Stroke History: 0 Vascular  Disease History: 1 Age Score: 2 Gender Score: 0          Physical Exam:    VS:  BP (!) 124/56   Pulse (!) 50   Ht 5\' 7"  (1.702 m)   Wt 153 lb 12.8 oz (69.8 kg)   SpO2 98%   BMI 24.09 kg/m     Wt Readings from Last 3 Encounters:  08/30/23 153 lb 12.8 oz (69.8 kg)  08/25/23 154 lb 5.2 oz (70 kg)  07/30/23 155 lb 6.4 oz (70.5 kg)     GEN: Well nourished, well developed in no acute distress  NECK: No JVD CARDIAC: RRR, 1/6 SEM @ RUSB. No rubs, gallops RESPIRATORY:  Clear to auscultation without rales, wheezing or rhonchi  ABDOMEN: Soft, non-tender, non-distended. Large inguinal hernia noted.  EXTREMITIES:  No edema; No deformity.  Groin sites clear without hematoma. Mild ecchymosis on left.   ASSESSMENT:    1. S/P TAVR (transcatheter aortic valve replacement)   2. Essential hypertension, benign   3. Hyperlipidemia LDL goal <70   4. PAF (paroxysmal atrial fibrillation) (HCC)   5. Left inguinal hernia   6. Thyroid  nodule     PLAN:    In order of problems listed above:  Severe AS s/p TAVR:  -- Pt doing well s/p TAVR.  -- ECG with no HAVB.  -- Groin sites healing well.  -- SBE prophylaxis discussed; I have RX'd amoxicillin .   -- Continue Aspirin  81mg  daily. -- Cleared to resume all activities without restriction. -- He will be seen back in June for echo and follow up in [facility].    HTN: -- Continue losartan  100mg  daily.   HLD: -- Continue simvastatin  10mg  daily.    PAF: -- S/p multiple ablations. -- S/p thorascopic closure of LAA 04/2022 and taken off OAC. -- Continue amiodarone  100mg  daily with 200mg  on the weekends.   Left inguinal hernia: -- The patient is cleared from a cardiac standpoint to have his hernia repaired at least 6 weeks out from his TAVR. -- Okay to schedule in July or after.    Thyroid  nodule: -- Pre TAVR CTs showed multiple bilateral thyroid  nodules, unchanged from previous. -- Pt has known about this and it is followed by  another physician for it.   Medication Adjustments/Labs and Tests Ordered: Current medicines are reviewed at length with the patient today.  Concerns regarding medicines are outlined above.  Orders Placed This Encounter  Procedures   EKG 12-Lead   Meds ordered this encounter  Medications   amoxicillin  (AMOXIL ) 500 MG tablet    Sig: Take 4 tablets (2,000 mg total) by mouth as directed. 1 hour prior to dental work including cleanings    Dispense:  12 tablet    Refill:  12    Supervising Provider:   Arnoldo Lapping [[id number]]    Patient Instructions  Medication Instructions:  Please stay on your current medications without change.   An antibiotic has been called into your pharmacy to take as needed for dental work. Please follow instructions on the bottle.   *If you need a refill on your cardiac medications before your next appointment, please call your pharmacy*  Lab Work: None If you have labs (blood work) drawn today and your tests are completely normal, you will receive your results only by: MyChart Message (if you have MyChart) OR A paper copy in the mail If you have any lab test that is abnormal or we need to change your treatment, we will call you to review the results.  Testing/Procedures: None  Follow-Up: At [facility], you and your health needs are our priority.  As part of our continuing mission to provide you with exceptional heart care, our providers are all part of one team.  This team includes your primary Cardiologist (physician) and Advanced Practice Providers or APPs (Physician Assistants and Nurse Practitioners) who all work together to provide you with the care you need, when you need it.  Your next appointment:   You will be seen next month for an echo and office visit in [facility] for follow up. Please  keep these appts.     Signed, Abagail Hoar, PA-C  08/30/2023 9:27 AM    [facility]

## 2023-08-30 ENCOUNTER — Encounter: Payer: Self-pay | Admitting: Physician Assistant

## 2023-08-30 ENCOUNTER — Ambulatory Visit: Attending: Physician Assistant | Admitting: Physician Assistant

## 2023-08-30 VITALS — BP 124/56 | HR 50 | Ht 67.0 in | Wt 153.8 lb

## 2023-08-30 DIAGNOSIS — K4090 Unilateral inguinal hernia, without obstruction or gangrene, not specified as recurrent: Secondary | ICD-10-CM | POA: Diagnosis not present

## 2023-08-30 DIAGNOSIS — I10 Essential (primary) hypertension: Secondary | ICD-10-CM | POA: Diagnosis not present

## 2023-08-30 DIAGNOSIS — E041 Nontoxic single thyroid nodule: Secondary | ICD-10-CM

## 2023-08-30 DIAGNOSIS — Z952 Presence of prosthetic heart valve: Secondary | ICD-10-CM | POA: Diagnosis not present

## 2023-08-30 DIAGNOSIS — I480 Paroxysmal atrial fibrillation: Secondary | ICD-10-CM

## 2023-08-30 DIAGNOSIS — E785 Hyperlipidemia, unspecified: Secondary | ICD-10-CM | POA: Diagnosis not present

## 2023-08-30 MED ORDER — AMOXICILLIN 500 MG PO TABS: 2000.0000 mg | ORAL_TABLET | ORAL | 12 refills | Status: DC

## 2023-08-30 NOTE — Patient Instructions (Addendum)
 Medication Instructions:  Please stay on your current medications without change.   An antibiotic has been called into your pharmacy to take as needed for dental work. Please follow instructions on the bottle.   *If you need a refill on your cardiac medications before your next appointment, please call your pharmacy*  Lab Work: None If you have labs (blood work) drawn today and your tests are completely normal, you will receive your results only by: MyChart Message (if you have MyChart) OR A paper copy in the mail If you have any lab test that is abnormal or we need to change your treatment, we will call you to review the results.  Testing/Procedures: None  Follow-Up: At [facility], you and your health needs are our priority.  As part of our continuing mission to provide you with exceptional heart care, our providers are all part of one team.  This team includes your primary Cardiologist (physician) and Advanced Practice Providers or APPs (Physician Assistants and Nurse Practitioners) who all work together to provide you with the care you need, when you need it.  Your next appointment:   You will be seen next month for an echo and office visit in [facility] for follow up. Please keep these appts.

## 2023-09-03 ENCOUNTER — Telehealth: Payer: Self-pay | Admitting: "Endocrinology

## 2023-09-03 DIAGNOSIS — E042 Nontoxic multinodular goiter: Secondary | ICD-10-CM

## 2023-09-03 NOTE — Telephone Encounter (Signed)
 Pt needs labs updated

## 2023-09-03 NOTE — Telephone Encounter (Signed)
 Labs updated and sent to [facility].

## 2023-09-07 DIAGNOSIS — E042 Nontoxic multinodular goiter: Secondary | ICD-10-CM | POA: Diagnosis not present

## 2023-09-08 LAB — T3, FREE: T3, Free: 2.7 pg/mL (ref 2.0–4.4)

## 2023-09-08 LAB — TSH: TSH: 0.975 u[IU]/mL (ref 0.450–4.500)

## 2023-09-08 LAB — T4, FREE: Free T4: 1.64 ng/dL (ref 0.82–1.77)

## 2023-09-15 ENCOUNTER — Encounter: Payer: Self-pay | Admitting: "Endocrinology

## 2023-09-15 ENCOUNTER — Ambulatory Visit: Admitting: "Endocrinology

## 2023-09-15 VITALS — BP 120/58 | HR 52 | Ht 67.0 in | Wt 154.6 lb

## 2023-09-15 DIAGNOSIS — E042 Nontoxic multinodular goiter: Secondary | ICD-10-CM
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
## 2023-09-21 DIAGNOSIS — Z954 Presence of other heart-valve replacement: Secondary | ICD-10-CM | POA: Diagnosis not present

## 2023-09-21 DIAGNOSIS — Z6822 Body mass index (BMI) 22.0-22.9, adult: Secondary | ICD-10-CM | POA: Diagnosis not present

## 2023-09-21 DIAGNOSIS — I129 Hypertensive chronic kidney disease with stage 1 through stage 4 chronic kidney disease, or unspecified chronic kidney disease: Secondary | ICD-10-CM | POA: Diagnosis not present

## 2023-09-21 DIAGNOSIS — I4891 Unspecified atrial fibrillation: Secondary | ICD-10-CM | POA: Diagnosis not present

## 2023-09-21 DIAGNOSIS — Z0001 Encounter for general adult medical examination with abnormal findings: Secondary | ICD-10-CM | POA: Diagnosis not present

## 2023-09-21 DIAGNOSIS — Z9229 Personal history of other drug therapy: Secondary | ICD-10-CM | POA: Diagnosis not present

## 2023-09-21 DIAGNOSIS — G7000 Myasthenia gravis without (acute) exacerbation: Secondary | ICD-10-CM | POA: Diagnosis not present

## 2023-09-21 DIAGNOSIS — E039 Hypothyroidism, unspecified: Secondary | ICD-10-CM | POA: Diagnosis not present

## 2023-09-21 DIAGNOSIS — N1831 Chronic kidney disease, stage 3a: Secondary | ICD-10-CM | POA: Diagnosis not present

## 2023-09-21 DIAGNOSIS — I10 Essential (primary) hypertension: Secondary | ICD-10-CM | POA: Diagnosis not present

## 2023-09-30 ENCOUNTER — Ambulatory Visit (HOSPITAL_COMMUNITY)
Admission: RE | Admit: 2023-09-30 | Discharge: 2023-09-30 | Disposition: A | Discharge status: Home / Self Care | Source: Ambulatory Visit | Attending: Internal Medicine | Admitting: Internal Medicine

## 2023-09-30 DIAGNOSIS — Z952 Presence of prosthetic heart valve: Secondary | ICD-10-CM | POA: Diagnosis not present

## 2023-09-30 LAB — ECHOCARDIOGRAM COMPLETE
AR max vel: 1.12 cm2
AV Area VTI: 1.3 cm2
AV Area mean vel: 1.29 cm2
AV Mean grad: 13 mmHg
AV Peak grad: 24.1 mmHg
Ao pk vel: 2.46 m/s
Area-P 1/2: 2.91 cm2
MV M vel: 4.56 m/s
MV Peak grad: 83.2 mmHg
P 1/2 time: 680 ms
Radius: 0.55 cm
S' Lateral: 3.3 cm

## 2023-09-30 NOTE — Progress Notes (Signed)
*  PRELIMINARY RESULTS* Echocardiogram 2D Echocardiogram has been performed.  Bernis Brisker 09/30/2023, 1:04 PM

## 2023-10-05 ENCOUNTER — Ambulatory Visit: Payer: Self-pay | Admitting: Physician Assistant

## 2023-10-06 ENCOUNTER — Ambulatory Visit: Attending: Student | Admitting: Student

## 2023-10-06 ENCOUNTER — Encounter: Payer: Self-pay | Admitting: Student

## 2023-10-06 VITALS — BP 132/68 | HR 54 | Ht 67.0 in | Wt 151.0 lb

## 2023-10-06 DIAGNOSIS — I2510 Atherosclerotic heart disease of native coronary artery without angina pectoris: Secondary | ICD-10-CM | POA: Diagnosis not present

## 2023-10-06 DIAGNOSIS — E785 Hyperlipidemia, unspecified: Secondary | ICD-10-CM

## 2023-10-06 DIAGNOSIS — Z952 Presence of prosthetic heart valve: Secondary | ICD-10-CM

## 2023-10-06 DIAGNOSIS — I10 Essential (primary) hypertension: Secondary | ICD-10-CM

## 2023-10-06 DIAGNOSIS — I480 Paroxysmal atrial fibrillation: Secondary | ICD-10-CM | POA: Diagnosis not present

## 2023-10-06 NOTE — Patient Instructions (Signed)
 Medication Instructions:  Your physician recommends that you continue on your current medications as directed. Please refer to the Current Medication list given to you today.  *If you need a refill on your cardiac medications before your next appointment, please call your pharmacy*  Lab Work: None If you have labs (blood work) drawn today and your tests are completely normal, you will receive your results only by: MyChart Message (if you have MyChart) OR A paper copy in the mail If you have any lab test that is abnormal or we need to change your treatment, we will call you to review the results.  Testing/Procedures: None  Follow-Up: At [facility], you and your health needs are our priority.  As part of our continuing mission to provide you with exceptional heart care, our providers are all part of one team.  This team includes your primary Cardiologist (physician) and Advanced Practice Providers or APPs (Physician Assistants and Nurse Practitioners) who all work together to provide you with the care you need, when you need it.  Your next appointment:   6 month(s)  Provider:   Dorn Ross, MD or Laymon Qua, PA-C    We recommend signing up for the patient portal called MyChart.  Sign up information is provided on this After Visit Summary.  MyChart is used to connect with patients for Virtual Visits (Telemedicine).  Patients are able to view lab/test results, encounter notes, upcoming appointments, etc.  Non-urgent messages can be sent to your provider as well.   To learn more about what you can do with MyChart, go to [email or web address].   Other Instructions None

## 2023-10-06 NOTE — Progress Notes (Signed)
 Cardiology Office Note    Date:  10/06/2023  ID:  Kyle [name]., DOB Oct 15, 1934, MRN [id number] Cardiologist: Alvan Carrier, MD   Structural Heart: Dr. Wendel  History of Present Illness:    Kyle [name]. is a 89 y.o. male with past medical history of persistent atrial flutter (s/p DCCV in 10/2020 and 11/2021, prior thoracoscopic closure of LAA in 04/2022, recurrent flutter in 12/2022 and s/p repeat DCCV), CAD (cath in 05/2023 showing minimal nonobstructive CAD), HTN, HLD, multinodular goiter (followed by Endocrinology) and aortic stenosis (s/p TAVR in 08/2023) who presents to the office today for 74-month follow-up.   He recently underwent TAVR on 08/24/2023 by Dr. Wendel and Dr. Lucas. He did have mild PVL following TAVR but was overall doing well and discharged home the following day. He was evaluated by Izetta Hummer, PA on 08/30/2023 and denied any chest pain or significant dyspnea. Did report fatigue throughout the day but activity was limited given his left groin hernia. He was continued on ASA 81mg  daily, Losartan  100mg  daily, Simvastatin  10mg  daily and Amiodarone  100mg  M-F and 200mg  on Saturdays and Sundays. Was cleared to proceed with hernia surgery once 6 weeks out from TAVR. He did have a repeat echo on 09/30/2023 which showed a preserved EF of 60 to 65%. There were no regional wall motion abnormalities. He did have moderate LVH, normal RV function, mildly elevated PASP at 40.7 mmHg and mild to moderate paravalvular leak but no aortic stenosis or regurgitation. Follow-up imaging was recommended in 1 year for reassessment.  In talking with the patient today, he reports overall doing well since his last office visit. He has resumed doing normal activities including yard work and denies any recent chest pain or palpitations with this  He has NYHA class I dyspnea. No specific orthopnea, PND or pitting edema. Does have mild swelling along his right leg which has occurred since a  prior DVT 20+ years ago. Does experience easy bruising with ASA but no melena, hematochezia or hematuria. He checks his blood pressure at home and this is typically overall well-controlled.  Studies Reviewed:   EKG: EKG is not ordered today.  R/LHC: 05/2023   Mid Cx lesion is 40% stenosed.   1.  Minimal, nonobstructive coronary artery disease. 2.  Fick cardiac output of 7.3 L/min and Fick cardiac index of 4.0 L/min/m with the following hemodynamics:            Right atrial pressure mean of 9 mmHg            Right ventricular pressure 41/2 with an end-diastolic pressure of 9 mmHg            Wedge pressure mean of 13 mmHg with V waves to 22 mmHg            PA pressure 45/14 with a mean of 28 mmHg            PVR of 2.1 Woods units            PA pulsatility index of 3.4 3.  Capacious iliofemoral vessels bilaterally.   Recommendation: Continue evaluation for aortic valve intervention.  Echocardiogram: 09/2023 IMPRESSIONS     1. Left ventricular ejection fraction, by estimation, is 60 to 65%. The  left ventricle has normal function. The left ventricle has no regional  wall motion abnormalities. There is moderate left ventricular hypertrophy  of the septal segment. Left  ventricular diastolic function could not be evaluated.   2.  Right ventricular systolic function is normal. The right ventricular  size is mildly enlarged. There is mildly elevated pulmonary artery  systolic pressure. The estimated right ventricular systolic pressure is  40.7 mmHg.   3. Left atrial size was severely dilated.   4. Right atrial size was severely dilated.   5. The mitral valve is normal in structure. Mild mitral valve  regurgitation. No evidence of mitral stenosis.   6. The tricuspid valve is abnormal. Tricuspid valve regurgitation is mild  to moderate.   7. The aortic valve has been repaired/replaced. There is mild to moderate  paravalvular leak along the intervalvular fibrosa and ventricular surface   of anterior mitral valve leaflet. Aortic valve regurgitation is not  visualized. No aortic stenosis is  present. There is a 26 mm Sapien prosthetic (TAVR) valve present in the  aortic position. Procedure Date: 08/24/2023. Aortic valve mean gradient  measures 13.0 mmHg (increased from 4.4 mm Hg on Echo performed on  08/25/2023).   8. The inferior vena cava is normal in size with greater than 50%  respiratory variability, suggesting right atrial pressure of 3 mmHg. - Followed bty    Risk Assessment/Calculations:    CHA2DS2-VASc Score = 4   This indicates a 4.8% annual risk of stroke. The patient's score is based upon: CHF History: 0 HTN History: 1 Diabetes History: 0 Stroke History: 0 Vascular Disease History: 1 Age Score: 2 Gender Score: 0   Physical Exam:   VS:  BP 132/68   Pulse (!) 54   Ht 5' 7 (1.702 m)   Wt 151 lb (68.5 kg)   SpO2 98%   BMI 23.65 kg/m    Wt Readings from Last 3 Encounters:  10/06/23 151 lb (68.5 kg)  09/15/23 154 lb 9.6 oz (70.1 kg)  08/30/23 153 lb 12.8 oz (69.8 kg)     GEN: Pleasant, elderly male appearing in no acute distress NECK: No JVD; No carotid bruits CARDIAC: RRR, no murmurs, rubs, gallops RESPIRATORY:  Clear to auscultation without rales, wheezing or rhonchi  ABDOMEN: Appears non-distended. No obvious abdominal masses. EXTREMITIES: No clubbing or cyanosis. No pitting edema.  Distal pedal pulses are 2+ bilaterally.   Assessment and Plan:   1. S/P TAVR (transcatheter aortic valve replacement) - He recently underwent TAVR with a 26 mm SAPIEN 3 valve on 08/24/2023. Recent echocardiogram earlier this month showed mild to moderate paravalvular leak but no stenosis or regurgitation. Was recommended by Structural Heart to obtain a follow-up echocardiogram in 1 year for reassessment.  - No recent respiratory issues and overall feeling well. Previously cleared by Structural Heart to proceed with inguinal hernia surgery once 6 weeks out from  TAVR.  2. PAF (paroxysmal atrial fibrillation) (HCC) - He underwent cardioversions in the past with most recent in 12/2022. Denies any recent palpitations. He has been continued on Amiodarone  100 mg daily and takes 200 mg on Saturdays and Sundays. TSH was normal at 0.975 when checked on 09/07/2023 and LFT's WNL in 08/2023. - He is no longer on anticoagulation since undergoing thorascopic closure of his left atrial appendage in 04/2022.  3. Coronary artery disease involving native coronary artery of native heart without angina pectoris - Cardiac catheterization in 05/2023 showed 40% mild LCx stenosis but no obstructive disease.   - He denies any recent anginal symptoms. Continue with risk factor modification. Continue ASA 81 mg daily and Simvastatin  10 mg daily  4. Essential hypertension, benign - BP was initially elevated at 144/60 during today's visit,  at 132/68 on recheck. Continue current medical therapy with Losartan  100 mg daily.  5. Hyperlipidemia LDL goal <70 - Followed by PCP. LDL was at 77 when checked earlier this month. He was previously on Simvastatin  20 mg daily but this was reduced to 10 mg daily last year given the concurrent use of Amiodarone . Reviewed options with the patient and he prefers to continue his current medical therapy for now.  If LDL remains above goal, would recommend switching to Crestor 10 mg daily.  Signed, Kyle CHRISTELLA [name], Kyle [name]

## 2023-10-07 ENCOUNTER — Encounter: Payer: Self-pay | Admitting: General Surgery

## 2023-10-07 ENCOUNTER — Ambulatory Visit: Admitting: General Surgery

## 2023-10-07 VITALS — BP 127/66 | HR 50 | Temp 98.1°F | Resp 12 | Ht 67.0 in | Wt 150.0 lb

## 2023-10-07 DIAGNOSIS — K4090 Unilateral inguinal hernia, without obstruction or gangrene, not specified as recurrent: Secondary | ICD-10-CM | POA: Diagnosis not present

## 2023-10-08 NOTE — H&P (Signed)
 Kyle [name]. is an 89 y.o. male.   Chief Complaint: Left inguinal hernia HPI: Patient is an 89 year old white male with a longstanding history of a left inguinal hernia who now presents for repair.  He is status post a TAVR for aortic stenosis recently and has been cleared for surgical intervention.  His hernia extends into the left scrotum and is difficult to reduce.  It is affected his voiding.  Past Medical History:  Diagnosis Date   Arthritis    Atrial fibrillation (HCC)    Auto immune neutropenia (HCC)    Bullous pemphigoid    Cancer (HCC)    Prostate   Cervical spondylosis without myelopathy 01/22/2016   Chronic kidney disease    Per MD note 05/2021 CKD stage non specified   Complication of anesthesia    difficult to wake up after anesthesia   DVT (deep venous thrombosis) (HCC)    a. Has had a previous right lower extremity DVT in the setting of hospitalization and surgery (after his brain tumor was resected in 1993) but is no longer on Coumadin.   Dyslipidemia    History of kidney stones    History of prostate cancer    Hypertension    Meningioma (HCC)    a. s/p resection 1990s.   Myasthenia (HCC) 02/14/2013   Nocturnal leg cramps    Ocular myasthenia gravis (HCC)    Peripheral neuropathy 11/23/2018   S/P TAVR (transcatheter aortic valve replacement) 08/24/2023   s/p TAVR with a 26 mm Edwards S3UR via the TF approach by Dr. Wendel and Dr. Lucas   Seizures [facility])    Sinus bradycardia    Sleep paralysis    Thyroid  nodule, cold     Past Surgical History:  Procedure Laterality Date   ABDOMINAL AORTOGRAM N/A 06/08/2023   Procedure: ABDOMINAL AORTOGRAM;  Surgeon: Wendel Lurena POUR, MD;  Location: [facility];  Service: Cardiovascular;  Laterality: N/A;   BLADDER SURGERY     removed part of the bladder   BRAIN SURGERY     CARDIOVERSION N/A 11/07/2020   Procedure: CARDIOVERSION;  Surgeon: Alvan Dorn FALCON, MD;  Location: [facility];  Service: Endoscopy;   Laterality: N/A;   CARDIOVERSION N/A 11/20/2021   Procedure: CARDIOVERSION;  Surgeon: Alvan Dorn FALCON, MD;  Location: [facility];  Service: Endoscopy;  Laterality: N/A;   CHOLECYSTECTOMY     CLIPPING OF ATRIAL APPENDAGE N/A 04/20/2022   Procedure: CLIPPING OF ATRIAL APPENDAGE USING 45 ATRIAL CLIP;  Surgeon: Murriel Toribio DEL, MD;  Location: [facility] OR;  Service: Open Heart Surgery;  Laterality: N/A;   COLONOSCOPY N/A 07/25/2013   Procedure: COLONOSCOPY;  Surgeon: Oneil DELENA Budge, MD;  Location: [facility];  Service: Gastroenterology;  Laterality: N/A;   COLONOSCOPY N/A 11/10/2016   Procedure: COLONOSCOPY;  Surgeon: Budge Oneil, MD;  Location: [facility];  Service: Gastroenterology;  Laterality: N/A;   INTRAOPERATIVE TRANSTHORACIC ECHOCARDIOGRAM N/A 08/24/2023   Procedure: ECHOCARDIOGRAM, TRANSTHORACIC;  Surgeon: Wendel Lurena POUR, MD;  Location: [facility];  Service: Cardiovascular;  Laterality: N/A;   IR CATHETER TUBE CHANGE  01/02/2017   IR RADIOLOGIST EVAL & MGMT  01/06/2017   IR RADIOLOGIST EVAL & MGMT  01/20/2017   IR RADIOLOGIST EVAL & MGMT  02/03/2017   IR RADIOLOGIST EVAL & MGMT  02/17/2017   IR SINUS/FIST TUBE CHK-NON GI  03/03/2017   PARTIAL COLECTOMY     RADIOACTIVE SEED IMPLANT     RIGHT/LEFT HEART CATH AND CORONARY ANGIOGRAPHY  N/A 06/08/2023   Procedure: RIGHT/LEFT HEART CATH AND CORONARY ANGIOGRAPHY;  Surgeon: Wendel Lurena POUR, MD;  Location: [facility];  Service: Cardiovascular;  Laterality: N/A;   TEE WITHOUT CARDIOVERSION N/A 04/20/2022   Procedure: TRANSESOPHAGEAL ECHOCARDIOGRAM (TEE);  Surgeon: Murriel Toribio DEL, MD;  Location: [facility] OR;  Service: Open Heart Surgery;  Laterality: N/A;   VIDEO ASSISTED THORACOSCOPY Left 04/20/2022   Procedure: VIDEO ASSISTED THORACOSCOPY;  Surgeon: Murriel Toribio DEL, MD;  Location: [facility] OR;  Service: Thoracic;  Laterality: Left;    Family History  Problem Relation Age of Onset   Heart failure Mother    Diabetes Mother    Diabetes Sister     Dementia Sister    Lung cancer Son    Heart failure Daughter    Colon cancer Neg Hx    Social History:  reports that he has never smoked. He has never been exposed to tobacco smoke. He has never used smokeless tobacco. He reports that he does not drink alcohol  and does not use drugs.  Allergies:  Allergies  Allergen Reactions   Iodinated Contrast Media Hives     pt was premedicated/hives reaction occurred 24 hours post injection, Onset Date: [id number]    Sulfa Antibiotics Hives, Swelling and Rash   Bactericin [Bacitracin] Other (See Comments)    Unknown reaction    Norvasc  [Amlodipine ] Swelling    Leg swelling    No medications prior to admission.    No results found for this or any previous visit (from the past 48 hours). No results found.  Review of Systems  Constitutional:  Positive for fatigue.  HENT: Negative.    Eyes: Negative.   Respiratory: Negative.    Cardiovascular: Negative.   Gastrointestinal:  Positive for abdominal pain.  Endocrine: Negative.   Genitourinary:  Positive for difficulty urinating.  Musculoskeletal: Negative.   Skin: Negative.   Allergic/Immunologic: Negative.   Neurological: Negative.   Hematological: Negative.   Psychiatric/Behavioral: Negative.      There were no vitals taken for this visit. Physical Exam Vitals reviewed.  Constitutional:      Appearance: Normal appearance. He is normal weight. He is not ill-appearing.  HENT:     Head: Normocephalic and atraumatic.   Cardiovascular:     Rate and Rhythm: Normal rate and regular rhythm.     Heart sounds: Normal heart sounds. No murmur heard.    No friction rub. No gallop.  Pulmonary:     Effort: Pulmonary effort is normal. No respiratory distress.     Breath sounds: Normal breath sounds. No stridor. No wheezing, rhonchi or rales.  Abdominal:     General: There is no distension.     Palpations: Abdomen is soft. There is no mass.     Tenderness: There is no abdominal  tenderness. There is no guarding or rebound.     Hernia: A hernia is present.     Comments: Large partially reducing left inguinal hernia.  No right inguinal hernia present.  Genitourinary:    Testes: Normal.   Skin:    General: Skin is warm and dry.   Neurological:     Mental Status: He is alert and oriented to person, place, and time.     Cardiology notes reviewed Assessment/Plan Impression: Large left inguinal hernia, stable status post TAVR Plan: Will proceed with a robotic assisted laparoscopic left inguinal herniorrhaphy with mesh, possible open on 10/21/2023.  The patient realizes that this may have to be done using the open technique  given the size of the hernia and the history of bladder surgery.  The risks and benefits of the procedure including bleeding, infection, cardiopulmonary difficulties, recurrence, mesh use, and the possibility of an open procedure were fully explained to the patient, who gave informed consent.  [name], MD 10/08/2023, 8:01 AM

## 2023-10-08 NOTE — Progress Notes (Signed)
 Subjective:     Kyle [name].  Patient presents back for evaluation and treatment of his left inguinal hernia.  He states that the hernia constantly sticks out and is causing him more discomfort.  He has had his aortic stenosis addressed via a TAVR.  He has done well from that procedure and has been cleared by cardiology for hernia repair. Objective:    BP 127/66   Pulse (!) 50   Temp 98.1 F (36.7 C) (Oral)   Resp 12   Ht 5' 7 (1.702 m)   Wt 150 lb (68 kg)   SpO2 94%   BMI 23.49 kg/m   General:  alert, cooperative, and no distress  Head is normocephalic, atraumatic Lungs clear to auscultation with equal breath sounds bilaterally Heart examination reveals a regular rate and rhythm without S3, S4, murmurs Abdomen soft, nontender, nondistended.  He has a large left inguinal hernia which is difficult to reduce.     Assessment:    Large left inguinal hernia, status post TAVR for aortic stenosis.  Cleared by cardiology for surgical intervention    Plan:   Patient is scheduled for a robotic assisted laparoscopic left inguinal herniorrhaphy with mesh, possible open on 10/21/2023.  He does realize that I may have to convert this to an open procedure given the size of the hernia and his previous bladder repair.  The risks and benefits of the procedure including bleeding, infection, cardiopulmonary difficulties, mesh use, the possibility of recurrence, and the possibility of an open procedure were fully explained to the patient, who gave informed consent.  He does realize he is at increased risk for a hematoma or seroma in the scrotum which can be treated with aspiration.

## 2023-10-19 ENCOUNTER — Encounter (HOSPITAL_COMMUNITY)
Admission: RE | Admit: 2023-10-19 | Discharge: 2023-10-19 | Disposition: A | Discharge status: Home / Self Care | Source: Ambulatory Visit | Attending: General Surgery | Admitting: General Surgery

## 2023-10-19 ENCOUNTER — Encounter (HOSPITAL_COMMUNITY): Payer: Self-pay

## 2023-10-19 NOTE — Patient Instructions (Signed)
 Kyle [name].  10/19/2023     @PREFPERIOPPHARMACY @   Your procedure is scheduled on 10/21/2023.   Report to [facility] at 1000 A.M.   Call this number if you have problems the morning of surgery:   [phone]  If you experience any cold or flu symptoms such as cough, fever, chills, shortness of breath, etc. between now and your scheduled surgery, please notify us  at the above number.   Remember:   Do not eat or drink after midnight.      Take these medicines the morning of surgery with A SIP OF WATER : Amiodarone  Hydrocodone  Keppra     Do not wear jewelry, make-up or nail polish, including gel polish,  artificial nails, or any other type of covering on natural nails (fingers and  toes).  Do not wear lotions, powders, or perfumes, or deodorant.  Do not shave 48 hours prior to surgery.  Men may shave face and neck.  Do not bring valuables to the hospital.  [facility] is not responsible for any belongings or valuables.  Contacts, dentures or bridgework may not be worn into surgery.  Leave your suitcase in the car.  After surgery it may be brought to your room.  For patients admitted to the hospital, discharge time will be determined by your treatment team.  Patients discharged the day of surgery will not be allowed to drive home.   Name and phone number of your driver:   Family  Special instructions:  N/A  Please read over the following fact sheets that you were given.  Care and Recovery After Surgery    Laparoscopic Surgery for Belly Hernias: What to Expect  Laparoscopic surgery for belly hernias is a procedure to treat a bulge of tissue that pushes through a weak area of the belly (ventral hernia). This procedure may be done right away if part of your intestine gets trapped inside the hernia and starts to lose its blood supply (strangulation). Laparoscopic surgery is done through small cuts using a scope with a light and camera (laparoscope). Tell a health  care provider about: Any allergies you have. All medicines you're taking. These include vitamins, herbs, eye drops, creams, and over-the-counter medicines. Any problems you or family members have had with anesthesia. Any bleeding problems you have. Any surgeries you've had. Any medical conditions you have. Whether you're pregnant or may be pregnant. What are the risks? Your health care provider will talk with you about risks. These may include: Infection. Bleeding or blood clots. Damage to nearby structures in the belly. Trouble pooping or peeing. The hernia coming back after surgery. Allergy to the mesh, if a mesh was used. Fluid buildup in the area of the hernia. In some cases, your provider may need to switch from a laparoscopic procedure to a procedure that's done through a single, larger incision in the belly (open procedure). You may need an open procedure if: You have a hernia that's hard to repair. Your organs are hard to see with the laparoscope. You have bleeding problems during the laparoscopic procedure. What happens before the procedure? When to stop eating and drinking Eat and drink only as you've been told. You may be told this: 8 hours before your surgery Stop eating most foods. Do not eat meat, fried foods, or fatty foods. Eat only light foods, such as toast or crackers. All liquids are OK except energy drinks and alcohol . 6 hours before your surgery Stop eating. Drink only clear liquids, such as  water , clear fruit juice, black coffee, plain tea, and sports drinks. Do not drink energy drinks or alcohol . 2 hours before your surgery Stop drinking all liquids. You may be allowed to take medicines with small sips of water . If you do not eat and drink as told, your surgery may be delayed or canceled. Medicines Ask about changing or stopping: Any medicines you take. Any vitamins, herbs, or supplements you take. Do not take aspirin  or ibuprofen unless you're told  to. Tests You may have an exam or testing, including: Blood tests. Pee tests. Ultrasound of the belly. Chest X-ray. Electrocardiogram (ECG). Surgery safety For your safety, you may: Need to wash your skin with a soap that kills germs. Get antibiotics. Have your surgery site marked. Have hair removed at the surgery site. General instructions Do not smoke, vape, or use nicotine or tobacco for at least 4 weeks before the surgery. Ask if you'll be staying overnight in the hospital. If you'll be going home right after the procedure, plan to have a responsible adult: Drive you home from the hospital or clinic. You won't be allowed to drive. Stay with you for the time you're told. What happens during the procedure?  An IV will be put into a vein in your hand or arm. You may be given: A sedative to help you relax. Anesthesia to keep you from feeling pain. Many small cuts (incisions) will be made in your belly. Gas will be pumped into your belly through one of the cuts. This will make it easier for your surgeon to see inside your belly during the repair. A laparoscope will be inserted into your belly. This will send pictures to a monitor in the operating room. The instruments needed for the surgery will be placed through the other cuts. The tissue or intestines that make up the hernia will be moved back into place. The edges of the hernia may be stitched together. A piece of mesh may be used to close the hernia. Stitches, clips, or staples will be used to keep the mesh in place. Your cuts will be closed with stitches, skin glue, or tape strips. They may be covered with a bandage. These steps may vary. Ask what you can expect. What happens after the procedure? You will be watched closely until you leave. This includes checking your pain level, blood pressure, heart rate, and breathing rate. You will continue to receive fluids and medicines through an IV. Your IV will be removed when you can  drink clear fluids. This information is not intended to replace advice given to you by your health care provider. Make sure you discuss any questions you have with your health care provider. Document Revised: 10/06/2022 Document Reviewed: 10/06/2022 Elsevier Patient Education  2024 [employer]   General Anesthesia, Adult General anesthesia is the use of medicine to make you fall asleep (unconscious) for a medical procedure. General anesthesia must be used for certain procedures. It is often recommended for surgery or procedures that: Last a long time. Require you to be still or in an unusual position. Are major and can cause blood loss. Affect your breathing. The medicines used for general anesthesia are called general anesthetics. During general anesthesia, these medicines are given along with medicines that: Prevent pain. Control your blood pressure. Relax your muscles. Prevent nausea and vomiting after the procedure. Tell a health care provider about: Any allergies you have. All medicines you are taking, including vitamins, herbs, eye drops, creams, and over-the-counter medicines. Your history  of any: Medical conditions you have, including: High blood pressure. Bleeding problems. Diabetes. Heart or lung conditions, such as: Heart failure. Sleep apnea. Asthma. Chronic obstructive pulmonary disease (COPD). Current or recent illnesses, such as: Upper respiratory, chest, or ear infections. Cough or fever. Tobacco or drug use, including marijuana or alcohol  use. Depression or anxiety. Surgeries and types of anesthetics you have had. Problems you or family members have had with anesthetic medicines. Whether you are pregnant or may be pregnant. Whether you have any chipped or loose teeth, dentures, caps, bridgework, or issues with your mouth, swallowing, or choking. What are the risks? Your health care provider will talk with you about risks. These may include: Allergic  reaction to the medicines. Lung and heart problems. Inhaling food or liquid from the stomach into the lungs (aspiration). Nerve injury. Injury to the lips, mouth, teeth, or gums. Stroke. Waking up during your procedure and being unable to move. This is rare. These problems are more likely to develop if you are having a major surgery or if you have an advanced or serious medical condition. You can prevent some of these complications by answering all of your health care provider's questions thoroughly and by following all instructions before your procedure. General anesthesia can cause side effects, including: Nausea or vomiting. A sore throat or hoarseness from the breathing tube. Wheezing or coughing. Shaking chills or feeling cold. Body aches. Sleepiness. Confusion, agitation (delirium), or anxiety. What happens before the procedure? When to stop eating and drinking Follow instructions from your health care provider about what you may eat and drink before your procedure. If you do not follow your health care provider's instructions, your procedure may be delayed or canceled. Medicines Ask your health care provider about: Changing or stopping your regular medicines. These include any diabetes medicines or blood thinners you take. Taking medicines such as aspirin  and ibuprofen. These medicines can thin your blood. Do not take them unless your health care provider tells you to. Taking over-the-counter medicines, vitamins, herbs, and supplements. General instructions Do not use any products that contain nicotine or tobacco for at least 4 weeks before the procedure. These products include cigarettes, chewing tobacco, and vaping devices, such as e-cigarettes. If you need help quitting, ask your health care provider. If you brush your teeth on the morning of the procedure, make sure to spit out all of the water  and toothpaste. If told by your health care provider, bring your sleep apnea device  with you to surgery (if applicable). If you will be going home right after the procedure, plan to have a responsible adult: Take you home from the hospital or clinic. You will not be allowed to drive. Care for you for the time you are told. What happens during the procedure?  An IV will be inserted into one of your veins. You will be given one or more of the following through a face mask or IV: A sedative. This helps you relax. Anesthesia. This will: Numb certain areas of your body. Make you fall asleep for surgery. After you are unconscious, a breathing tube may be inserted down your throat to help you breathe. This will be removed before you wake up. An anesthesia provider, such as an anesthesiologist, will stay with you throughout your procedure. The anesthesia provider will: Keep you comfortable and safe by continuing to give you medicines and adjusting the amount of medicine that you get. Monitor your blood pressure, heart rate, and oxygen levels to make sure that the  anesthetics do not cause any problems. The procedure may vary among health care providers and hospitals. What happens after the procedure? Your blood pressure, temperature, heart rate, breathing rate, and blood oxygen level will be monitored until you leave the hospital or clinic. You will wake up in a recovery area. You may wake up slowly. You may be given medicine to help you with pain, nausea, or any other side effects from the anesthesia. Summary General anesthesia is the use of medicine to make you fall asleep (unconscious) for a medical procedure. Follow your health care provider's instructions about when to stop eating, drinking, or taking certain medicines before your procedure. Plan to have a responsible adult take you home from the hospital or clinic. This information is not intended to replace advice given to you by your health care provider. Make sure you discuss any questions you have with your health care  provider. Document Revised: 06/26/2021 Document Reviewed: 06/26/2021 Elsevier Patient Education  2024 Else[employer] Inc.  How to Use Chlorhexidine  at Home in the Shower Chlorhexidine  gluconate (CHG) is a germ-killing (antiseptic) wash that's used to clean the skin. It can get rid of the germs that normally live on the skin and can keep them away for about 24 hours. If you're having surgery, you may be told to shower with CHG at home the night before surgery. This can help lower your risk for infection. To use CHG wash in the shower, follow the steps below. Supplies needed: CHG body wash. Clean washcloth. Clean towel. How to use CHG in the shower Follow these steps unless you're told to use CHG in a different way: Start the shower. Use your normal soap and shampoo to wash your face and hair. Turn off the shower or move out of the shower stream. Pour CHG onto a clean washcloth. Do not use any type of brush or rough sponge. Start at your neck, washing your body down to your toes. Make sure you: Wash the part of your body where the surgery will be done for at least 1 minute. Do not scrub. Do not use CHG on your head or face unless your health care provider tells you to. If it gets into your ears or eyes, rinse them well with water . Do not wash your genitals with CHG. Wash your back and under your arms. Make sure to wash skin folds. Let the CHG sit on your skin for 1-2 minutes or as long as told. Rinse your entire body in the shower, including all body creases and folds. Turn off the shower. Dry off with a clean towel. Do not put anything on your skin afterward, such as powder, lotion, or perfume. Put on clean clothes or pajamas. If it's the night before surgery, sleep in clean sheets. General tips Use CHG only as told, and follow the instructions on the label. Use the full amount of CHG as told. This is often one bottle. Do not smoke and stay away from flames after using CHG. Your skin may  feel sticky after using CHG. This is normal. The sticky feeling will go away as the CHG dries. Do not use CHG: If you have a chlorhexidine  allergy or have reacted to chlorhexidine  in the past. On open wounds or areas of skin that have broken skin, cuts, or scrapes. On babies younger than 80 months of age. Contact a health care provider if: You have questions about using CHG. Your skin gets irritated or itchy. You have a rash after using CHG.  You swallow any CHG. Call your local poison control center [phone] in the [place] Your eyes itch badly, or they become very red or swollen. Your hearing changes. You have trouble seeing. If you can't reach your provider, go to an urgent care or emergency room. Do not drive yourself. Get help right away if: You have swelling or tingling in your mouth or throat. You make high-pitched whistling sounds when you breathe, most often when you breathe out (wheeze). You have trouble breathing. These symptoms may be an emergency. Call 911 right away. Do not wait to see if the symptoms will go away. Do not drive yourself to the hospital. This information is not intended to replace advice given to you by your health care provider. Make sure you discuss any questions you have with your health care provider. Document Revised: 10/13/2022 Document Reviewed: 10/09/2021 Elsevier Patient Education  2024 [employer].

## 2023-10-21 ENCOUNTER — Ambulatory Visit (HOSPITAL_COMMUNITY): Admitting: Certified Registered"

## 2023-10-21 ENCOUNTER — Ambulatory Visit (HOSPITAL_COMMUNITY)
Admission: RE | Admit: 2023-10-21 | Discharge: 2023-10-21 | Disposition: A | Discharge status: Home / Self Care | Attending: General Surgery | Admitting: General Surgery

## 2023-10-21 ENCOUNTER — Other Ambulatory Visit: Payer: Self-pay

## 2023-10-21 ENCOUNTER — Encounter (HOSPITAL_COMMUNITY): Payer: Self-pay | Admitting: General Surgery

## 2023-10-21 ENCOUNTER — Encounter (HOSPITAL_COMMUNITY)
Admission: RE | Disposition: A | Discharge status: Home / Self Care | Payer: Self-pay | Source: Home / Self Care | Attending: General Surgery

## 2023-10-21 DIAGNOSIS — R109 Unspecified abdominal pain: Secondary | ICD-10-CM | POA: Diagnosis not present

## 2023-10-21 DIAGNOSIS — R5383 Other fatigue: Secondary | ICD-10-CM | POA: Diagnosis not present

## 2023-10-21 DIAGNOSIS — I480 Paroxysmal atrial fibrillation: Secondary | ICD-10-CM | POA: Diagnosis not present

## 2023-10-21 DIAGNOSIS — Z952 Presence of prosthetic heart valve: Secondary | ICD-10-CM | POA: Insufficient documentation

## 2023-10-21 DIAGNOSIS — R569 Unspecified convulsions: Secondary | ICD-10-CM | POA: Diagnosis not present

## 2023-10-21 DIAGNOSIS — Z8546 Personal history of malignant neoplasm of prostate: Secondary | ICD-10-CM | POA: Diagnosis not present

## 2023-10-21 DIAGNOSIS — I10 Essential (primary) hypertension: Secondary | ICD-10-CM | POA: Diagnosis not present

## 2023-10-21 DIAGNOSIS — R300 Dysuria: Secondary | ICD-10-CM | POA: Insufficient documentation

## 2023-10-21 DIAGNOSIS — I129 Hypertensive chronic kidney disease with stage 1 through stage 4 chronic kidney disease, or unspecified chronic kidney disease: Secondary | ICD-10-CM | POA: Insufficient documentation

## 2023-10-21 DIAGNOSIS — Z87442 Personal history of urinary calculi: Secondary | ICD-10-CM | POA: Insufficient documentation

## 2023-10-21 DIAGNOSIS — Z86718 Personal history of other venous thrombosis and embolism: Secondary | ICD-10-CM | POA: Insufficient documentation

## 2023-10-21 DIAGNOSIS — E785 Hyperlipidemia, unspecified: Secondary | ICD-10-CM | POA: Insufficient documentation

## 2023-10-21 DIAGNOSIS — G7000 Myasthenia gravis without (acute) exacerbation: Secondary | ICD-10-CM | POA: Diagnosis not present

## 2023-10-21 DIAGNOSIS — K4090 Unilateral inguinal hernia, without obstruction or gangrene, not specified as recurrent: Secondary | ICD-10-CM

## 2023-10-21 DIAGNOSIS — I499 Cardiac arrhythmia, unspecified: Secondary | ICD-10-CM | POA: Diagnosis not present

## 2023-10-21 DIAGNOSIS — I4891 Unspecified atrial fibrillation: Secondary | ICD-10-CM | POA: Insufficient documentation

## 2023-10-21 DIAGNOSIS — N189 Chronic kidney disease, unspecified: Secondary | ICD-10-CM | POA: Diagnosis not present

## 2023-10-21 HISTORY — PX: XI ROBOTIC ASSISTED INGUINAL HERNIA REPAIR WITH MESH: SHX6706

## 2023-10-21 SURGERY — REPAIR, HERNIA, INGUINAL, ROBOT-ASSISTED, LAPAROSCOPIC, USING MESH
Anesthesia: General | Site: Groin | Laterality: Left

## 2023-10-21 MED ORDER — DEXAMETHASONE SODIUM PHOSPHATE 4 MG/ML IJ SOLN
INTRAMUSCULAR | Status: DC | PRN
  Administered 2023-10-21 (×2): 5 mg via INTRAVENOUS

## 2023-10-21 MED ORDER — SUGAMMADEX SODIUM 200 MG/2ML IV SOLN
INTRAVENOUS | Status: AC
  Filled 2023-10-21: qty 4

## 2023-10-21 MED ORDER — ROCURONIUM BROMIDE 10 MG/ML (PF) SYRINGE
PREFILLED_SYRINGE | INTRAVENOUS | Status: DC | PRN
  Administered 2023-10-21: 10 mg via INTRAVENOUS
  Administered 2023-10-21: 50 mg via INTRAVENOUS

## 2023-10-21 MED ORDER — ORAL CARE MOUTH RINSE: 15.0000 mL | Freq: Once | OROMUCOSAL | Status: DC

## 2023-10-21 MED ORDER — SUGAMMADEX SODIUM 200 MG/2ML IV SOLN
INTRAVENOUS | Status: DC | PRN
Start: 2023-10-21 — End: 2023-10-21
  Administered 2023-10-21: 200 mg via INTRAVENOUS

## 2023-10-21 MED ORDER — FENTANYL CITRATE (PF) 100 MCG/2ML IJ SOLN
INTRAMUSCULAR | Status: AC
  Filled 2023-10-21: qty 2

## 2023-10-21 MED ORDER — LACTATED RINGERS IV SOLN
INTRAVENOUS | Status: DC | PRN
Start: 2023-10-21 — End: 2023-10-21

## 2023-10-21 MED ORDER — CHLORHEXIDINE GLUCONATE CLOTH 2 % EX PADS
6.0000 | MEDICATED_PAD | Freq: Once | CUTANEOUS | Status: AC
  Administered 2023-10-21: 6 via TOPICAL

## 2023-10-21 MED ORDER — EPHEDRINE SULFATE (PRESSORS) 50 MG/ML IJ SOLN
INTRAMUSCULAR | Status: DC | PRN
Start: 2023-10-21 — End: 2023-10-21
  Administered 2023-10-21: 5 mg via INTRAVENOUS

## 2023-10-21 MED ORDER — FENTANYL CITRATE PF 50 MCG/ML IJ SOSY: 25.0000 ug | PREFILLED_SYRINGE | INTRAMUSCULAR | Status: DC | PRN

## 2023-10-21 MED ORDER — SUGAMMADEX SODIUM 200 MG/2ML IV SOLN
INTRAVENOUS | Status: AC
Start: 2023-10-21 — End: 2023-10-21
  Filled 2023-10-21: qty 2

## 2023-10-21 MED ORDER — CEFAZOLIN SODIUM-DEXTROSE 2-4 GM/100ML-% IV SOLN
INTRAVENOUS | Status: AC
  Filled 2023-10-21: qty 100

## 2023-10-21 MED ORDER — STERILE WATER FOR IRRIGATION IR SOLN
Status: DC | PRN
  Administered 2023-10-21: 500 mL

## 2023-10-21 MED ORDER — PROPOFOL 10 MG/ML IV BOLUS
INTRAVENOUS | Status: AC
Start: 2023-10-21 — End: 2023-10-21
  Filled 2023-10-21: qty 20

## 2023-10-21 MED ORDER — PROPOFOL 10 MG/ML IV BOLUS
INTRAVENOUS | Status: DC | PRN
  Administered 2023-10-21: 100 mg via INTRAVENOUS
  Administered 2023-10-21: 50 mg via INTRAVENOUS

## 2023-10-21 MED ORDER — CHLORHEXIDINE GLUCONATE 0.12 % MT SOLN: 15.0000 mL | Freq: Once | OROMUCOSAL | Status: DC

## 2023-10-21 MED ORDER — GLYCOPYRROLATE PF 0.2 MG/ML IJ SOSY
PREFILLED_SYRINGE | INTRAMUSCULAR | Status: DC | PRN
  Administered 2023-10-21: .2 mg via INTRAVENOUS

## 2023-10-21 MED ORDER — DEXAMETHASONE SODIUM PHOSPHATE 10 MG/ML IJ SOLN
INTRAMUSCULAR | Status: AC
  Filled 2023-10-21: qty 1

## 2023-10-21 MED ORDER — CHLORHEXIDINE GLUCONATE 0.12 % MT SOLN
15.0000 mL | Freq: Once | OROMUCOSAL | Status: AC
  Administered 2023-10-21: 15 mL via OROMUCOSAL

## 2023-10-21 MED ORDER — CHLORHEXIDINE GLUCONATE CLOTH 2 % EX PADS: 6.0000 | MEDICATED_PAD | Freq: Once | CUTANEOUS | Status: DC

## 2023-10-21 MED ORDER — ONDANSETRON HCL 4 MG/2ML IJ SOLN: 4.0000 mg | Freq: Once | INTRAMUSCULAR | Status: DC | PRN

## 2023-10-21 MED ORDER — ONDANSETRON HCL 4 MG/2ML IJ SOLN
INTRAMUSCULAR | Status: DC | PRN
  Administered 2023-10-21: 4 mg via INTRAVENOUS

## 2023-10-21 MED ORDER — ONDANSETRON HCL 4 MG/2ML IJ SOLN
INTRAMUSCULAR | Status: AC
Start: 2023-10-21 — End: 2023-10-21
  Filled 2023-10-21: qty 2

## 2023-10-21 MED ORDER — LACTATED RINGERS IV SOLN: INTRAVENOUS | Status: DC

## 2023-10-21 MED ORDER — CEFAZOLIN SODIUM-DEXTROSE 2-4 GM/100ML-% IV SOLN
2.0000 g | INTRAVENOUS | Status: AC
  Administered 2023-10-21: 2 g via INTRAVENOUS

## 2023-10-21 MED ORDER — BUPIVACAINE HCL (PF) 0.5 % IJ SOLN
INTRAMUSCULAR | Status: AC
  Filled 2023-10-21: qty 30

## 2023-10-21 MED ORDER — OXYCODONE HCL 5 MG/5ML PO SOLN: 5.0000 mg | Freq: Once | ORAL | Status: DC | PRN

## 2023-10-21 MED ORDER — BUPIVACAINE HCL (PF) 0.5 % IJ SOLN
INTRAMUSCULAR | Status: DC | PRN
  Administered 2023-10-21: 30 mL

## 2023-10-21 MED ORDER — OXYCODONE HCL 5 MG PO TABS: 5.0000 mg | ORAL_TABLET | Freq: Once | ORAL | Status: DC | PRN

## 2023-10-21 MED ORDER — FENTANYL CITRATE (PF) 100 MCG/2ML IJ SOLN
INTRAMUSCULAR | Status: DC | PRN
  Administered 2023-10-21 (×2): 50 ug via INTRAVENOUS

## 2023-10-21 SURGICAL SUPPLY — 41 items
CHLORAPREP W/TINT 26 (MISCELLANEOUS) ×1 IMPLANT
COVER LIGHT HANDLE (MISCELLANEOUS) IMPLANT
COVER MAYO STAND XLG (MISCELLANEOUS) ×1 IMPLANT
COVER TIP SHEARS 8 DVNC (MISCELLANEOUS) ×1 IMPLANT
DERMABOND ADVANCED .7 DNX12 (GAUZE/BANDAGES/DRESSINGS) ×1 IMPLANT
DRAPE ARM DVNC X/XI (DISPOSABLE) ×3 IMPLANT
DRAPE COLUMN DVNC XI (DISPOSABLE) ×1 IMPLANT
DRIVER NDL MEGA SUTCUT DVNCXI (INSTRUMENTS) ×1 IMPLANT
DRIVER NDLE MEGA SUTCUT DVNCXI (INSTRUMENTS) ×1 IMPLANT
ELECTRODE REM PT RTRN 9FT ADLT (ELECTROSURGICAL) ×1 IMPLANT
FORCEPS BPLR R/ABLATION 8 DVNC (INSTRUMENTS) ×1 IMPLANT
GAUZE SPONGE 4X4 12PLY STRL (GAUZE/BANDAGES/DRESSINGS) ×1 IMPLANT
GLOVE BIOGEL PI IND STRL 7.0 (GLOVE) ×4 IMPLANT
GLOVE SURG SS PI 7.5 STRL IVOR (GLOVE) ×2 IMPLANT
GOWN STRL REUS W/TWL LRG LVL3 (GOWN DISPOSABLE) ×2 IMPLANT
IRRIGATOR SUCT 8 DISP DVNC XI (IRRIGATION / IRRIGATOR) IMPLANT
KIT PINK PAD W/HEAD ARM REST (MISCELLANEOUS) ×1 IMPLANT
KIT TURNOVER KIT A (KITS) ×1 IMPLANT
MANIFOLD NEPTUNE II (INSTRUMENTS) ×1 IMPLANT
MESH 3DMAX MID 5X7 LT XLRG (Mesh General) IMPLANT
NDL HYPO 21X1.5 SAFETY (NEEDLE) ×1 IMPLANT
NDL INSUFFLATION 14GA 120MM (NEEDLE) ×1 IMPLANT
NEEDLE HYPO 21X1.5 SAFETY (NEEDLE) ×1 IMPLANT
NEEDLE INSUFFLATION 14GA 120MM (NEEDLE) ×1 IMPLANT
OBTURATOR OPTICALSTD 8 DVNC (TROCAR) ×1 IMPLANT
PACK LAP CHOLE LZT030E (CUSTOM PROCEDURE TRAY) ×1 IMPLANT
PENCIL HANDSWITCHING (ELECTRODE) ×1 IMPLANT
POSITIONER HEAD 8X9X4 ADT (SOFTGOODS) ×1 IMPLANT
SCISSORS MNPLR CVD DVNC XI (INSTRUMENTS) ×1 IMPLANT
SEAL UNIV 5-12 XI (MISCELLANEOUS) ×3 IMPLANT
SET BASIN LINEN APH (SET/KITS/TRAYS/PACK) ×1 IMPLANT
SET TUBE SMOKE EVAC HIGH FLOW (TUBING) ×1 IMPLANT
SOL PREP POV-IOD 4OZ 10% (MISCELLANEOUS) ×1 IMPLANT
SUT MNCRL AB 4-0 PS2 18 (SUTURE) ×2 IMPLANT
SUT STRATA 3-0 SH (SUTURE) ×4 IMPLANT
SUT VIC AB 2-0 SH 27X BRD (SUTURE) ×1 IMPLANT
SUT VICRYL 0 UR6 27IN ABS (SUTURE) IMPLANT
SYR 30ML LL (SYRINGE) ×1 IMPLANT
TAPE TRANSPORE STRL 2 31045 (GAUZE/BANDAGES/DRESSINGS) ×1 IMPLANT
TRAY FOL W/BAG SLVR 16FR STRL (SET/KITS/TRAYS/PACK) ×1 IMPLANT
WATER STERILE IRR 500ML POUR (IV SOLUTION) ×1 IMPLANT

## 2023-10-21 NOTE — Anesthesia Procedure Notes (Signed)
 Procedure Name: Intubation Date/Time: 10/21/2023 12:33 PM  Performed by: Dartha Meckel, CRNAPre-anesthesia Checklist: Patient identified, Emergency Drugs available, Suction available and Patient being monitored Patient Re-evaluated:Patient Re-evaluated prior to induction Oxygen Delivery Method: Circle system utilized Preoxygenation: Pre-oxygenation with 100% oxygen Induction Type: IV induction Ventilation: Mask ventilation without difficulty Laryngoscope Size: Mac and 4 Grade View: Grade I Tube type: Oral Tube size: 7.5 mm Number of attempts: 1 Airway Equipment and Method: Stylet and Oral airway Placement Confirmation: ETT inserted through vocal cords under direct vision, positive ETCO2 and breath sounds checked- equal and bilateral Secured at: 23 cm Tube secured with: Tape Dental Injury: Teeth and Oropharynx as per pre-operative assessment

## 2023-10-21 NOTE — Interval H&P Note (Signed)
 History and Physical Interval Note:  10/21/2023 11:21 AM  Kyle [name].  has presented today for surgery, with the diagnosis of HERNIA, INGUINAL LEFT.  The various methods of treatment have been discussed with the patient and family. After consideration of risks, benefits and other options for treatment, the patient has consented to  Procedure(s) with comments: REPAIR, HERNIA, INGUINAL, ROBOT-ASSISTED, LAPAROSCOPIC, USING MESH (Left) - POSSIBLE OPEN as a surgical intervention.  The patient's history has been reviewed, patient examined, no change in status, stable for surgery.  I have reviewed the patient's chart and labs.  Questions were answered to the patient's satisfaction.     [name]

## 2023-10-21 NOTE — Anesthesia Preprocedure Evaluation (Signed)
 Anesthesia Evaluation  Patient identified by MRN, date of birth, ID band Patient awake    Reviewed: Allergy & Precautions, H&P , NPO status , Patient's Chart, lab work & pertinent test results, reviewed documented beta blocker date and time   History of Anesthesia Complications (+) history of anesthetic complications  Airway Mallampati: II  TM Distance: >3 FB Neck ROM: full    Dental no notable dental hx.    Pulmonary neg pulmonary ROS   Pulmonary exam normal breath sounds clear to auscultation       Cardiovascular Exercise Tolerance: Good hypertension, + dysrhythmias  Rhythm:regular Rate:Normal     Neuro/Psych Seizures -,   Neuromuscular disease  negative psych ROS   GI/Hepatic negative GI ROS, Neg liver ROS,,,  Endo/Other  negative endocrine ROS    Renal/GU Renal disease  negative genitourinary   Musculoskeletal   Abdominal   Peds  Hematology negative hematology ROS (+)   Anesthesia Other Findings   Reproductive/Obstetrics negative OB ROS                              Anesthesia Physical Anesthesia Plan  ASA: 3  Anesthesia Plan: General and General ETT   Post-op Pain Management:    Induction:   PONV Risk Score and Plan: Ondansetron   Airway Management Planned:   Additional Equipment:   Intra-op Plan:   Post-operative Plan:   Informed Consent: I have reviewed the patients History and Physical, chart, labs and discussed the procedure including the risks, benefits and alternatives for the proposed anesthesia with the patient or authorized representative who has indicated his/her understanding and acceptance.     Dental Advisory Given  Plan Discussed with: CRNA  Anesthesia Plan Comments:         Anesthesia Quick Evaluation

## 2023-10-21 NOTE — Transfer of Care (Signed)
 Immediate Anesthesia Transfer of Care Note  Patient: Kyle [name].  Procedure(s) Performed: REPAIR, HERNIA, INGUINAL, ROBOT-ASSISTED, LAPAROSCOPIC, USING MESH (Left: Groin)  Patient Location: PACU  Anesthesia Type:General  Level of Consciousness: awake and alert   Airway & Oxygen Therapy: Patient Spontanous Breathing and Patient connected to nasal cannula oxygen  Post-op Assessment: Report given to RN and Post -op Vital signs reviewed and stable  Post vital signs: Reviewed and stable  Last Vitals:  Vitals Value Taken Time  BP 167/85 10/21/23 14:35  Temp    Pulse 79 10/21/23 14:36  Resp 9 10/21/23 14:36  SpO2 100 % 10/21/23 14:36  Vitals shown include unfiled device data.  Last Pain:  Vitals:   10/21/23 1019  TempSrc: Oral  PainSc: 0-No pain         Complications: No notable events documented.

## 2023-10-21 NOTE — Op Note (Signed)
 Patient:  Kyle [name].  DOB:  1934/06/07  MRN:  [id number]   Preop Diagnosis: Left inguinal hernia  Postop Diagnosis: Same  Procedure: Robotic assisted laparoscopic left inguinal herniorrhaphy with mesh  Surgeon: [name], MD  Anes: General Endotracheal  Indications: Patient is an 89 year old white male who presents with a very large left inguinal hernia.  The risks and benefits of the procedure including bleeding, infection, mesh use, and the possibility of recurrence of the hernia were fully explained to the patient, who gave informed consent.  Procedure note: The patient was placed in the supine position.  After induction of general endotracheal anesthesia, the abdomen and perineum were prepped and draped using the usual sterile technique with ChloraPrep and Betadine.  Surgical site confirmation was performed.  An incision was made in the left upper quadrant at Palmer's point.  A Veress needle was introduced into the abdominal cavity and confirmation of placement was done using the saline drop test.  The abdomen was then insufflated to 15 mmHg pressure.  An 8 mm trocar was introduced into the abdominal cavity under direct visualization without difficulty.  The patient was placed in deeper Trendelenburg position and additional 8 mm trocars were placed in the epigastric region and right upper quadrant regions.  The robot was then docked and targeted.  The patient had a large reduced left inguinal hernia which was indirect in nature.  A peritoneal flap was then formed down to Cooper's ligament.  This was carried laterally to the left flank.  The patient's indirect hernia sac was then freed away from the spermatic cord and reduced.  I was able to dissect free the peritoneal flap greater than 7 cm to the posterior edge of the hernia defect.  An extra-large Bard 3D max mesh was then inserted and secured to Cooper's ligament using a 2-0 Vicryl interrupted suture.  This was likewise done to  the anterior abdominal wall just above the hernia defect.  The peritoneal flap was then closed using a 3-0 Stratafix running suture.  A small defect along the upper edge of the peritoneal flap was closed using a 2-0 Vicryl figure-of-eight suture.  Air was evacuated from the abdominal cavity and good approximation of the peritoneum was noted to the mesh.  The robot was undocked and all air was evacuated from the abdominal cavity prior to the removal of the trocars.  All wounds were irrigated with normal saline.  All wounds were injected with 0.5% Sensorcaine .  An additional 0.5% Sensorcaine  was injected into the ileal inguinal nerve area.  The midline fascia was closed using an 0 Vicryl interrupted suture.  The skin was closed using a 4-0 Monocryl subcuticular suture.  Dermabond was applied.  All tape and needle counts were correct at the end of the procedure.  The patient was extubated in the operating room and transferred to PACU in stable condition.  Complications: None  EBL: Minimal  Specimen: None

## 2023-10-22 ENCOUNTER — Encounter (HOSPITAL_COMMUNITY): Payer: Self-pay | Admitting: General Surgery

## 2023-10-22 NOTE — Anesthesia Postprocedure Evaluation (Signed)
 Anesthesia Post Note  Patient: Kyle [name].  Procedure(s) Performed: REPAIR, HERNIA, INGUINAL, ROBOT-ASSISTED, LAPAROSCOPIC, USING MESH (Left: Groin)  Patient location during evaluation: Phase II Anesthesia Type: General Level of consciousness: awake Pain management: pain level controlled Vital Signs Assessment: post-procedure vital signs reviewed and stable Respiratory status: spontaneous breathing and respiratory function stable Cardiovascular status: blood pressure returned to baseline and stable Postop Assessment: no headache and no apparent nausea or vomiting Anesthetic complications: no Comments: Late entry   No notable events documented.   Last Vitals:  Vitals:   10/21/23 1516 10/21/23 1527  BP:    Pulse:    Resp:  16  Temp: 36.6 C   SpO2:  97%    Last Pain:  Vitals:   10/22/23 1245  TempSrc:   PainSc: 3                  Yvonna JINNY Bosworth

## 2023-10-28 ENCOUNTER — Ambulatory Visit (INDEPENDENT_AMBULATORY_CARE_PROVIDER_SITE_OTHER): Admitting: General Surgery

## 2023-10-28 ENCOUNTER — Encounter: Payer: Self-pay | Admitting: General Surgery

## 2023-10-28 VITALS — BP 139/62 | HR 57 | Temp 97.7°F | Resp 12 | Ht 67.0 in | Wt 151.0 lb

## 2023-10-28 DIAGNOSIS — Z09 Encounter for follow-up examination after completed treatment for conditions other than malignant neoplasm: Secondary | ICD-10-CM

## 2023-10-29 NOTE — Progress Notes (Signed)
 Subjective:     Kyle [name].  Patient here for postoperative visit, status post a robotic assisted laparoscopic left inguinal herniorrhaphy with mesh.  He is doing well.  He has had some swelling in the left scrotum.  He otherwise is pleased with the results.  He has had minimal incisional pain. Objective:    BP 139/62   Pulse (!) 57   Temp 97.7 F (36.5 C) (Oral)   Resp 12   Ht 5' 7 (1.702 m)   Wt 151 lb (68.5 kg)   SpO2 98%   BMI 23.65 kg/m   General:  alert, cooperative, and no distress  Abdomen soft, incisions healing well.  120 cc of serosanguineous fluid was aspirated from the residual hernia sac in the scrotum.     Assessment:    Doing well postoperatively. Postoperative hematoma/seroma of the hernia sac    Plan:   He understands this was not an abnormal finding given the size of his hernia.  He will return in 3 weeks for reevaluation.  May increase activity as able.

## 2023-11-18 ENCOUNTER — Ambulatory Visit (INDEPENDENT_AMBULATORY_CARE_PROVIDER_SITE_OTHER): Admitting: General Surgery

## 2023-11-18 ENCOUNTER — Encounter: Payer: Self-pay | Admitting: General Surgery

## 2023-11-18 VITALS — BP 155/68 | HR 70 | Temp 98.2°F | Resp 14 | Ht 67.0 in | Wt 153.0 lb

## 2023-11-18 DIAGNOSIS — Z09 Encounter for follow-up examination after completed treatment for conditions other than malignant neoplasm: Secondary | ICD-10-CM

## 2023-11-18 NOTE — Progress Notes (Addendum)
 Subjective:     Kyle [name].  Patient here for follow-up visit, status post robotic assisted laparoscopic left inguinal herniorrhaphy with mesh.  He states his hematoma/seroma has recurred in the left scrotum, but is not as prominent as it was before.  He otherwise has no other complaints. Objective:    BP (!) 155/68   Pulse 70   Temp 98.2 F (36.8 C) (Oral)   Resp 14   Ht 5' 7 (1.702 m)   Wt 153 lb (69.4 kg)   SpO2 94%   BMI 23.96 kg/m   General:  alert, cooperative, and no distress  Abdomen soft.  Incisions well-healed.  An 80 cc seroma/hematoma was aspirated from the scrotum.     Assessment:    Doing well postoperatively. Postoperative hematoma/seroma    Plan:   He fully realizes that this is a normal occurrence after having repaired such a large left inguinal hernia.  I will see him again in 3 weeks for follow-up.

## 2023-11-26 ENCOUNTER — Other Ambulatory Visit: Payer: Self-pay | Admitting: Neurology

## 2023-11-26 ENCOUNTER — Other Ambulatory Visit: Payer: Self-pay | Admitting: Internal Medicine

## 2023-11-29 DIAGNOSIS — Z79899 Other long term (current) drug therapy: Secondary | ICD-10-CM | POA: Diagnosis not present

## 2023-11-29 DIAGNOSIS — G8929 Other chronic pain: Secondary | ICD-10-CM | POA: Diagnosis not present

## 2023-11-29 DIAGNOSIS — Z954 Presence of other heart-valve replacement: Secondary | ICD-10-CM | POA: Diagnosis not present

## 2023-11-29 DIAGNOSIS — I4891 Unspecified atrial fibrillation: Secondary | ICD-10-CM | POA: Diagnosis not present

## 2023-12-06 ENCOUNTER — Telehealth: Payer: Self-pay | Admitting: Internal Medicine

## 2023-12-06 MED ORDER — AMIODARONE HCL 200 MG PO TABS: ORAL_TABLET | ORAL | 1 refills | Status: DC

## 2023-12-06 NOTE — Telephone Encounter (Signed)
Refilled as request

## 2023-12-06 NOTE — Telephone Encounter (Signed)
 Patient was told he had to make an appointment before receiving the full 90 day quantity.  He is now scheduled to see Dr. Waddell in December. Please call in the full script or enough to last to visit.

## 2023-12-09 ENCOUNTER — Encounter: Payer: Self-pay | Admitting: General Surgery

## 2023-12-09 ENCOUNTER — Ambulatory Visit (INDEPENDENT_AMBULATORY_CARE_PROVIDER_SITE_OTHER): Admitting: General Surgery

## 2023-12-09 VITALS — BP 137/68 | HR 56 | Temp 98.2°F | Resp 12 | Ht 67.0 in | Wt 153.0 lb

## 2023-12-09 DIAGNOSIS — Z09 Encounter for follow-up examination after completed treatment for conditions other than malignant neoplasm: Secondary | ICD-10-CM

## 2023-12-09 NOTE — Progress Notes (Signed)
 Subjective:     Kyle [name].  Patient here for follow-up of hematoma of left scrotum, status post robotic assisted laparoscopic left inguinal herniorrhaphy with mesh.  Patient states that approximately 2 weeks ago, the swelling started back.  It is nontender.  No fevers have been noted. Objective:    BP 137/68   Pulse (!) 56   Temp 98.2 F (36.8 C) (Oral)   Resp 12   Ht 5' 7 (1.702 m)   Wt 153 lb (69.4 kg)   SpO2 93%   BMI 23.96 kg/m   General:  alert, cooperative, and no distress  Abdomen soft.  Incisions well-healed.  120 cc hematoma/seroma aspirated from scrotum.  No recurrent hernia present.     Assessment:    Postoperative hematoma    Plan:   Is difficult for the patient to support his scrotum as he wears depends.  I will see him again in 3 weeks for follow-up.

## 2023-12-16 DIAGNOSIS — N200 Calculus of kidney: Secondary | ICD-10-CM | POA: Diagnosis not present

## 2023-12-17 DIAGNOSIS — N200 Calculus of kidney: Secondary | ICD-10-CM | POA: Diagnosis not present

## 2023-12-21 ENCOUNTER — Telehealth: Payer: Self-pay | Admitting: Internal Medicine

## 2023-12-21 MED ORDER — AMIODARONE HCL 200 MG PO TABS: ORAL_TABLET | ORAL | 0 refills | Status: DC

## 2023-12-21 NOTE — Telephone Encounter (Signed)
 Pt's medication was sent to pt's pharmacy as requested. Confirmation received.

## 2023-12-21 NOTE — Telephone Encounter (Signed)
 Pt needs amiodarone  sent it.  He thought it was sent in but, he hasn't received a message from [facility] about upcoming delivery or the medication yet.

## 2023-12-23 DIAGNOSIS — H02831 Dermatochalasis of right upper eyelid: Secondary | ICD-10-CM | POA: Diagnosis not present

## 2023-12-23 DIAGNOSIS — H02834 Dermatochalasis of left upper eyelid: Secondary | ICD-10-CM | POA: Diagnosis not present

## 2023-12-23 DIAGNOSIS — H25813 Combined forms of age-related cataract, bilateral: Secondary | ICD-10-CM | POA: Diagnosis not present

## 2023-12-23 DIAGNOSIS — H01001 Unspecified blepharitis right upper eyelid: Secondary | ICD-10-CM | POA: Diagnosis not present

## 2023-12-28 ENCOUNTER — Encounter: Payer: Self-pay | Admitting: Neurology

## 2023-12-28 ENCOUNTER — Ambulatory Visit: Payer: Medicare Other | Admitting: Neurology

## 2023-12-28 VITALS — BP 120/58 | HR 54 | Ht 67.0 in | Wt 154.0 lb

## 2023-12-28 DIAGNOSIS — M5416 Radiculopathy, lumbar region: Secondary | ICD-10-CM | POA: Diagnosis not present

## 2023-12-28 DIAGNOSIS — G7000 Myasthenia gravis without (acute) exacerbation: Secondary | ICD-10-CM | POA: Diagnosis not present

## 2023-12-28 DIAGNOSIS — R269 Unspecified abnormalities of gait and mobility: Secondary | ICD-10-CM | POA: Diagnosis not present

## 2023-12-28 DIAGNOSIS — R001 Bradycardia, unspecified: Secondary | ICD-10-CM | POA: Diagnosis not present

## 2023-12-28 MED ORDER — GABAPENTIN 600 MG PO TABS: 600.0000 mg | ORAL_TABLET | Freq: Every day | ORAL | 3 refills | Status: AC

## 2023-12-28 MED ORDER — LEVETIRACETAM 500 MG PO TABS: ORAL_TABLET | ORAL | 3 refills | Status: AC

## 2023-12-28 MED ORDER — PYRIDOSTIGMINE BROMIDE 60 MG PO TABS: 60.0000 mg | ORAL_TABLET | Freq: Three times a day (TID) | ORAL | 3 refills | Status: AC | PRN

## 2023-12-28 NOTE — Progress Notes (Signed)
 Chief Complaint  Patient presents with   New Patient (Initial Visit)    Pt in room 15. Alone. Myasthenia,Lumbar radiculopathy Gait abnormality ,Bradycardia.      ASSESSMENT AND PLAN  Kyle [name]. is a 89 y.o. male   History of left frontal craniotomy for benign left frontal tumor in 1993, with evidence of left frontal encephalomalacia Complex partial seizure  Doing well keep current Keppra  500 mg half tablet in the morning/1 at night,  Lumbar radiculopathy, chronic low back pain, worsening gait abnormality  With pain management, not a surgical candidate, taking gabapentin  600 mg every night, hydrocodone  as needed  His gait abnormality is most related to his deconditioning, lumbar radiculopathy, chronic low back pain, it is not due to myasthenia gravis,  History of ocular myasthenia gravis,  No significant extraocular muscle weakness, no bulbar, proximal upper and lower extremity muscle weakness noted  Bradykinesia, no change after Mestinon  dose was decreased from 90 mg 4 times a day through 3 times a day, advised him further decrease, take it only as needed     DIAGNOSTIC DATA (LABS, IMAGING, TESTING) - I reviewed patient records, labs, notes, testing and imaging myself where available.   MEDICAL HISTORY:  Kyle [name]., is a 89 year old male, follow-up on his neurological condition, ocular myasthenia gravis, complex partial seizure, history of left frontal craniotomy, left lumbar radiculopathy, chronic low back pain, his primary care physician is Dr. Bertell Satterfield, MD  I reviewed and summarized the referring note. PMHx. A fib DVT Left Craniotomy in 1993,  Partial seizure HTN Oculomyasthenia gravis  Patient had a history of left frontal craniotomy for benign brain tumor in 1993, had a complex partial seizure symptoms, doing well taking, and Keppra  500 mg half in the morning, 1 at night, has not had recurrent seizure for many years, described seizure preceded  by feeling weird, then whole body shaking  Personally reviewed CT head without contrast 2014, MRI of the brain with without contrast February 2007,  left craniotomy with a cortical/subcortical resection of the posterior superior left frontal mass, with associated left frontal encephalomalacia  He had a significant arthritis disease, chronic low back pain, radiating pain to bilateral lower extremity, frequent bilateral lower extremity muscle cramp, is under pain management Dr. Bonner, personally reviewed MRI of lumbar in June 2023, multilevel degenerative changes, scoliosis,  moderate spinal and right foraminal stenosis, left L4-5 foraminal impingement, L5-S1 left paracentral disc protrusion impinging on the left S1 nerve roots  He was seen by neurosurgeon, deemed not to be a surgical candidate, now taking gabapentin  600 mg every night, tizanidine  as needed, failed multiple epidural injection recently, taking hydrocodone  low-dose  He also carries a diagnosis of ocular myasthenia gravis, reported transient double vision, response to Mestinon , is on relative high dose 60 mg 1 and half tablets 4 times a day, denies significant side effect, but no longer have double vision, denies bulbar weakness, mild gait abnormality due to his chronic low back problem, multiple hands joint pain, deformity, mild grip weakness  History of atrial fibrillation, on chronic anticoagulation, no longer a candidate for NSAIDs, recent evaluation for possible watchman's procedure, was aborted due to reported history of severe allergic reaction to iodine ,  UPDATE Sept 16th 2024: His Mestinon  was decreased from 90 tablets 4 times a day to 3 times a day, he did not notice any difference, in specific, no diplopia, no dysarthria, dysphagia,  He has chronic low back pain, taking hydrocodone  as needed, slow worsening  gait abnormality  He was recently seen by cardiologist for severe aortic stenosis,  UPDATE Sept 16 2025: He is now  taking mestinon  60mg  tid, it seems to help his double vision, only rarely he has  a flash of double vision.  He has some worsening gait abnormality in the setting of worsening low back pain, lumbar stenosis, he worries that his gait issue is related to the myasthenia gravis, there was no bulbar weakness, no upper extremity weakness, I think his gait abnormality is most related to his low back issues  He has transcatheter aortic valve replacement, on amiodarone  Had left inguinal hernia on October 21 2023. Planning on left cataract surgery on Jan 14 2024,  PHYSICAL EXAM:   Vitals:   12/28/23 1000  BP: (!) 120/58  Pulse: (!) 54  SpO2: 100%  Weight: 154 lb (69.9 kg)  Height: 5' 7 (1.702 m)   Body mass index is 24.12 kg/m.  PHYSICAL EXAMNIATION:  Gen: NAD, conversant, well nourised, well groomed                     Cardiovascular: Regular rate rhythm, no peripheral edema, warm, nontender. Eyes: Conjunctivae clear without exudates or hemorrhage Neck: Supple, no carotid bruits. Pulmonary: Clear to auscultation bilaterally   NEUROLOGICAL EXAM:  MENTAL STATUS: Speech/cognition: Awake, alert, oriented to history taking and casual conversation CRANIAL NERVES: CN II: Visual fields are full to confrontation. Pupils are round equal and briskly reactive to light. CN III, IV, VI: extraocular movement are normal. No ptosis.  Cover and uncover showed no significant extraocular muscle weakness CN V: Facial sensation is intact to light touch CN VII: Face is symmetric with normal eye closure, normal cheek puff, CN VIII: Hearing is normal to causal conversation. CN IX, X: Phonation is normal. CN XI: Head turning and shoulder shrug are intact  MOTOR: Deformity of multiple joints at bilateral hands, slight bilateral hip flexion weakness, no significant ankle plantarflexion weakness  REFLEXES: Reflexes are 1 and symmetric at the biceps, triceps, knees, and absent at ankles. Plantar responses are  flexor.  SENSORY: Mild length-dependent decreased light touch, pinprick to distal shin level  COORDINATION: There is no trunk or limb dysmetria noted.  GAIT/STANCE: Need push up to get up from seated position, antalgic, mildly unsteady  REVIEW OF SYSTEMS:  Full 14 system review of systems performed and notable only for as above All other review of systems were negative.   ALLERGIES: Allergies  Allergen Reactions   Iodinated Contrast Media Hives     pt was premedicated/hives reaction occurred 24 hours post injection, Onset Date: [id number]    Sulfa Antibiotics Hives, Swelling and Rash   Bactericin [Bacitracin] Other (See Comments)    Unknown reaction    Norvasc  [Amlodipine ] Swelling    Leg swelling    HOME MEDICATIONS: Current Outpatient Medications  Medication Sig Dispense Refill   amiodarone  (PACERONE ) 200 MG tablet TAKE 1 TABLET BY MOUTH DAILY ON  SATURDAY AND SUNDAY AND 1/2  TABLET DAILY MONDAY THROUGH  FRIDAY. 75 tablet 0   aspirin  EC 81 MG tablet Take 1 tablet (81 mg total) by mouth daily. Swallow whole.     cholecalciferol (VITAMIN D3) 25 MCG (1000 UNIT) tablet Take 2,000 Units by mouth 2 (two) times daily.     clobetasol (TEMOVATE) 0.05 % external solution Apply 1 Application topically 2 (two) times daily as needed (blisters).     clobetasol cream (TEMOVATE) 0.05 % Apply 1 Application topically daily as needed (blisters).  fish oil-omega-3 fatty acids  1000 MG capsule Take 2 g by mouth 2 (two) times daily.     Flaxseed, Linseed, (FLAXSEED OIL) 1200 MG CAPS Take 1,200 mg by mouth daily.     gabapentin  (NEURONTIN ) 600 MG tablet TAKE 1 TABLET BY MOUTH DAILY 60 tablet 5   Garlic (GARLIQUE PO) Take 1 tablet by mouth daily.     HYDROcodone -acetaminophen  (NORCO/VICODIN) 5-325 MG tablet Take 1 tablet by mouth 2 (two) times daily.     levETIRAcetam  (KEPPRA ) 500 MG tablet TAKE ONE-HALF TABLET BY MOUTH IN THE MORNING, TAKE 1 TABLET IN  THE EVENING 90 tablet 5   losartan  (COZAAR )  100 MG tablet Take 1 tablet (100 mg total) by mouth daily. 90 tablet 3   MAGNESIUM  CITRATE PO Take 400 mg by mouth at bedtime. Chew     niacinamide  500 MG tablet Take 500 mg by mouth 3 (three) times daily.     Polyethyl Glycol-Propyl Glycol 0.4-0.3 % SOLN Place 1 drop into both eyes 3 (three) times daily.     pyridostigmine  (MESTINON ) 60 MG tablet Take 1 tablet (60 mg total) by mouth 3 (three) times daily as needed. (Patient taking differently: Take 60 mg by mouth 3 (three) times daily.) 270 tablet 3   simvastatin  (ZOCOR ) 20 MG tablet Take 10 mg by mouth at bedtime.     tiZANidine  (ZANAFLEX ) 4 MG tablet Take 4 mg by mouth at bedtime.     No current facility-administered medications for this visit.    PAST MEDICAL HISTORY: Past Medical History:  Diagnosis Date   Arthritis    Atrial fibrillation (HCC)    Auto immune neutropenia (HCC)    Bullous pemphigoid    Cancer (HCC)    Prostate   Cervical spondylosis without myelopathy 01/22/2016   Chronic kidney disease    Per MD note 05/2021 CKD stage non specified   Complication of anesthesia    difficult to wake up after anesthesia   DVT (deep venous thrombosis) (HCC)    a. Has had a previous right lower extremity DVT in the setting of hospitalization and surgery (after his brain tumor was resected in 1993) but is no longer on Coumadin.   Dyslipidemia    Dysrhythmia    History of kidney stones    History of prostate cancer    Hypertension    Meningioma (HCC)    a. s/p resection 1990s.   Myasthenia (HCC) 02/14/2013   Nocturnal leg cramps    Ocular myasthenia gravis (HCC)    Peripheral neuropathy 11/23/2018   S/P TAVR (transcatheter aortic valve replacement) 08/24/2023   s/p TAVR with a 26 mm Edwards S3UR via the TF approach by Dr. Wendel and Dr. Lucas   Seizures [facility])    Sinus bradycardia    Sleep paralysis    Thyroid  nodule, cold     PAST SURGICAL HISTORY: Past Surgical History:  Procedure Laterality Date   ABDOMINAL  AORTOGRAM N/A 06/08/2023   Procedure: ABDOMINAL AORTOGRAM;  Surgeon: Wendel Lurena POUR, MD;  Location: [facility];  Service: Cardiovascular;  Laterality: N/A;   BLADDER SURGERY     removed part of the bladder   BRAIN SURGERY     CARDIAC VALVE REPLACEMENT  08/24/2023   CARDIOVERSION N/A 11/07/2020   Procedure: CARDIOVERSION;  Surgeon: Alvan Dorn FALCON, MD;  Location: [facility] ORS;  Service: Endoscopy;  Laterality: N/A;   CARDIOVERSION N/A 11/20/2021   Procedure: CARDIOVERSION;  Surgeon: Alvan Dorn FALCON, MD;  Location: [facility] OR[facility];  Service:  Endoscopy;  Laterality: N/A;   CHOLECYSTECTOMY     CLIPPING OF ATRIAL APPENDAGE N/A 04/20/2022   Procedure: CLIPPING OF ATRIAL APPENDAGE USING 45 ATRIAL CLIP;  Surgeon: Murriel Toribio DEL, MD;  Location: [facility] OR;  Service: Open Heart Surgery;  Laterality: N/A;   COLONOSCOPY N/A 07/25/2013   Procedure: COLONOSCOPY;  Surgeon: Oneil DELENA Budge, MD;  Location: [facility];  Service: Gastroenterology;  Laterality: N/A;   COLONOSCOPY N/A 11/10/2016   Procedure: COLONOSCOPY;  Surgeon: Budge Oneil, MD;  Location: [facility];  Service: Gastroenterology;  Laterality: N/A;   INTRAOPERATIVE TRANSTHORACIC ECHOCARDIOGRAM N/A 08/24/2023   Procedure: ECHOCARDIOGRAM, TRANSTHORACIC;  Surgeon: Wendel Lurena POUR, MD;  Location: [facility];  Service: Cardiovascular;  Laterality: N/A;   IR CATHETER TUBE CHANGE  01/02/2017   IR RADIOLOGIST EVAL & MGMT  01/06/2017   IR RADIOLOGIST EVAL & MGMT  01/20/2017   IR RADIOLOGIST EVAL & MGMT  02/03/2017   IR RADIOLOGIST EVAL & MGMT  02/17/2017   IR SINUS/FIST TUBE CHK-NON GI  03/03/2017   PARTIAL COLECTOMY     RADIOACTIVE SEED IMPLANT     RIGHT/LEFT HEART CATH AND CORONARY ANGIOGRAPHY N/A 06/08/2023   Procedure: RIGHT/LEFT HEART CATH AND CORONARY ANGIOGRAPHY;  Surgeon: Wendel Lurena POUR, MD;  [facility]: [facility];  Service: Cardiovascular;  Laterality: N/A;   TEE WITHOUT CARDIOVERSION N/A 04/20/2022   Procedure:  TRANSESOPHAGEAL ECHOCARDIOGRAM (TEE);  Surgeon: Murriel Toribio DEL, MD;  Location: [facility] OR;  Service: Open Heart Surgery;  Laterality: N/A;   VIDEO ASSISTED THORACOSCOPY Left 04/20/2022   Procedure: VIDEO ASSISTED THORACOSCOPY;  Surgeon: Murriel Toribio DEL, MD;  Location[facility] OR;  Service: Thoracic;  Laterality: Left;   XI ROBOTIC ASSISTED INGUINAL HERNIA REPAIR WITH MESH Left 10/21/2023   Procedure: REPAIR, HERNIA, INGUINAL, ROBOT-ASSISTED, LAPAROSCOPIC, USING MESH;  Surgeon: Budge Oneil, MD;  Location: [facility];  Service: General;  Laterality: Left;    FAMILY HISTORY: Family History  Problem Relation Age of Onset   Heart failure Mother    Diabetes Mother    Diabetes Sister    Dementia Sister    Lung cancer Son    Heart failure Daughter    Colon cancer Neg Hx     SOCIAL HISTORY: Social History   Socioeconomic History   Marital status: Married    Spouse name: Diplomatic Services operational officer   Number of children: 1   Years of education: 12 TH   Highest education level: Not on file  Occupational History   Occupation: retired  Tobacco Use   Smoking status: Never    Passive exposure: Never   Smokeless tobacco: Never  Vaping Use   Vaping status: Never Used  Substance and Sexual Activity   Alcohol  use: No   Drug use: No   Sexual activity: Not on file  Other Topics Concern   Not on file  Social History Narrative   Patient is right handed.   Patient drinks 1 cup of coffee daily.   Social Drivers of Corporate investment banker Strain: Low Risk  (12/15/2022)   Overall Financial Resource Strain (CARDIA)    Difficulty of Paying Living Expenses: Not hard at all  Food Insecurity: No Food Insecurity (08/24/2023)   Hunger Vital Sign    Worried About Running Out of Food in the Last Year: Never true    Ran Out of Food in the Last Year: Never true  Transportation Needs: No Transportation Needs (08/24/2023)   PRAPARE - Transportation    Lack of  Transportation (Medical): No    Lack of Transportation (Non-Medical): No   Physical Activity: Sufficiently Active (11/09/2022)   Exercise Vital Sign    Days of Exercise per Week: 7 days    Minutes of Exercise per Session: 30 min  Stress: Not on file  Social Connections: Socially Integrated (08/24/2023)   Social Connection and Isolation Panel    Frequency of Communication with Friends and Family: Twice a week    Frequency of Social Gatherings with Friends and Family: Once a week    Attends Religious Services: 1 to 4 times per year    Active Member of [employer] or Organizations: Yes    Attends Banker Meetings: 1 to 4 times per year    Marital Status: Married  Catering manager Violence: Not At Risk (08/24/2023)   Humiliation, Afraid, Rape, and Kick questionnaire    Fear of Current or Ex-Partner: No    Emotionally Abused: No    Physically Abused: No    Sexually Abused: No      Modena Callander, M.D. Ph.D.  [facility] [address], [address] [place], [place] [address] Ph: [phone] Fax: [phone]  CC:  Bertell Satterfield, MD [address] [place],  [place] [address]  Bertell Satterfield, MD

## 2023-12-30 ENCOUNTER — Encounter: Payer: Self-pay | Admitting: General Surgery

## 2023-12-30 ENCOUNTER — Ambulatory Visit (INDEPENDENT_AMBULATORY_CARE_PROVIDER_SITE_OTHER): Admitting: General Surgery

## 2023-12-30 VITALS — BP 154/57 | HR 53 | Temp 97.9°F | Resp 16 | Ht 67.0 in | Wt 155.0 lb

## 2023-12-30 DIAGNOSIS — Z09 Encounter for follow-up examination after completed treatment for conditions other than malignant neoplasm: Secondary | ICD-10-CM

## 2023-12-30 NOTE — Progress Notes (Signed)
 Subjective:     Kyle [name].  Patient here for follow-up of hematoma of the left scrotum, status post robotic assisted laparoscopic left inguinal herniorrhaphy with mesh.  He states the swelling seems to be less than when I last saw him.  He has been doing normal activity at home.  No pain has been noted. Objective:    BP (!) 154/57   Pulse (!) 53   Temp 97.9 F (36.6 C) (Oral)   Resp 16   Ht 5' 7 (1.702 m)   Wt 155 lb (70.3 kg)   SpO2 95%   BMI 24.28 kg/m   General:  alert, cooperative, and no distress  Abdomen is soft, incisions are well-healed.  A 65 cc bloody seroma was aspirated.  This is significantly less then his last visit.     Assessment:    Doing well postoperatively. Resolving seroma/hematoma of left groin.    Plan:   Patient to return in 3 weeks for follow-up visit.  Encouraging that the seroma has decreased.

## 2024-01-07 DIAGNOSIS — H25812 Combined forms of age-related cataract, left eye: Secondary | ICD-10-CM | POA: Diagnosis not present

## 2024-01-11 ENCOUNTER — Encounter (HOSPITAL_COMMUNITY)
Admission: RE | Admit: 2024-01-11 | Discharge: 2024-01-11 | Disposition: A | Discharge status: Home / Self Care | Source: Ambulatory Visit | Attending: Ophthalmology | Admitting: Ophthalmology

## 2024-01-11 NOTE — H&P (Signed)
 Surgical History & Physical  Patient Name: Kyle [name]  DOB: February 03, 1935  Surgery: Cataract extraction with intraocular lens implant phacoemulsification; Left Eye Surgeon: Lynwood Hermann MD Surgery Date: 01/14/2024 Pre-Op Date: 12/23/2023  HPI: A Kyle Yr. old male patient 1.  The patient is here for a Cataract eval. Pt. complains of difficulty when recognizing people. Both eyes are affected. The episode is constant. The condition's severity increased since last visit. Symptoms occur when the patient is reading. Near vision is worse than distance. The complaint is associated with blurry vision. Pt. was scheduled for surgery, but was cancelled due to heart problems pt. was treated and now is ready to proceed with surgery. This is negatively affecting the patient's quality of life and the patient is unable to function adequately in life with the current level of vision. HPI Completed by Dr. Lynwood Hermann  Medical History:  Arthritis Heart Problem High Blood Pressure Seizure, Myasthenia Gravis, Bullous Pemphigoid  Review of Systems Cardiovascular High Blood Pressure, Afib Musculoskeletal Arthritis All recorded systems are negative except as noted above  Social Never smoked   Medication Prednisolone-moxiflox-bromfen,  Keppra  (seizure), Pyridostigmine , Niacinamide , Eliquis , Garlic, Hydrocodone , Gabapentin , Tizandine , Gabapentin , Hydrocodone -acetaminophen , Levetiracetam , Tizanidine , Amiodarone , Pyridostigmine  bromide, Losartan , Diclofenac  sodium, Simvastatin   Sx/Procedures Brain Tumor Sx, Gallbladder Sx, Prostate Seeding, Part of colon and bladder removed  Drug Allergies  Sulfa, CT Scan Dye, CAT scan dye  History & Physical: Heent: cataracts NECK: supple without bruits LUNGS: lungs clear to auscultation CV: regular rate and rhythm Abdomen: soft and non-tender  Impression & Plan: Assessment: 1.  COMBINED FORMS AGE RELATED CATARACT; Both Eyes (H25.813) 2.  DERMATOCHALASIS, no  surgery; Right Upper Lid, Left Upper Lid (H02.831, H02.834) 3.  BLEPHARITIS; Right Upper Lid, Right Lower Lid, Left Upper Lid, Left Lower Lid (H01.001, H01.002,H01.004,H01.005) 4.  ARCUS SENILIS; Both Eyes (H18.413) 5.  Pinguecula; Both Eyes (H11.153) 6.  VITREOUS DETACHMENT PVD; Right Eye (H43.811) 7.  ASTIGMATISM, REGULAR; Both Eyes (H52.223)  Plan: 1.  Cataract accounts for the patient's decreased vision. This visual impairment is not correctable with a tolerable change in glasses or contact lenses. Cataract surgery with an implantation of a new lens should significantly improve the visual and functional status of the patient. Discussed all risks, benefits, alternatives, and potential complications. Discussed the procedures and recovery. Patient desires to have surgery. A-scan ordered and performed today for intra-ocular lens calculations. The surgery will be performed in order to improve vision for driving, reading, and for eye examinations. Recommend phacoemulsification with intra-ocular lens. Recommend Dextenza  for post-operative pain and inflammation. History of corneal refractive Surgery: None History of Previous Ocular Surgery (PPV, other): None History of ocular trauma: None Use of Eye Pressure Lowering Drops: None Left Eye worse. OS first, then OD. Dilates poorly - shugarcaine by protocol. Malyugin Ring. Omidira. Standard IOL.  2.  Asymptomatic, recommend observation for now. Findings, prognosis and treatment options reviewed.  3.  Recommend regular lid cleaning.  4.  Discussed significance of finding Answered patient questions about finding  5.  Observe; Artificial tears as needed for irritation.  6.  Old Asymptomatic. RD precautions given. Patient to call with increase in flashing lights/floaters/dark curtain. Symptomatic.  7.  Toric IOL if multifocal.

## 2024-01-14 ENCOUNTER — Encounter (HOSPITAL_COMMUNITY): Admission: RE | Disposition: A | Payer: Self-pay | Source: Home / Self Care | Attending: Ophthalmology

## 2024-01-14 ENCOUNTER — Ambulatory Visit (HOSPITAL_BASED_OUTPATIENT_CLINIC_OR_DEPARTMENT_OTHER): Admitting: Anesthesiology

## 2024-01-14 ENCOUNTER — Ambulatory Visit (HOSPITAL_COMMUNITY): Admitting: Anesthesiology

## 2024-01-14 ENCOUNTER — Ambulatory Visit (HOSPITAL_COMMUNITY)
Admission: RE | Admit: 2024-01-14 | Discharge: 2024-01-14 | Disposition: A | Discharge status: Home / Self Care | Attending: Ophthalmology | Admitting: Ophthalmology

## 2024-01-14 ENCOUNTER — Encounter (HOSPITAL_COMMUNITY): Payer: Self-pay | Admitting: Ophthalmology

## 2024-01-14 DIAGNOSIS — I4891 Unspecified atrial fibrillation: Secondary | ICD-10-CM | POA: Diagnosis not present

## 2024-01-14 DIAGNOSIS — I10 Essential (primary) hypertension: Secondary | ICD-10-CM

## 2024-01-14 DIAGNOSIS — H25811 Combined forms of age-related cataract, right eye: Secondary | ICD-10-CM | POA: Insufficient documentation

## 2024-01-14 DIAGNOSIS — R569 Unspecified convulsions: Secondary | ICD-10-CM | POA: Diagnosis not present

## 2024-01-14 DIAGNOSIS — I480 Paroxysmal atrial fibrillation: Secondary | ICD-10-CM

## 2024-01-14 DIAGNOSIS — H5711 Ocular pain, right eye: Secondary | ICD-10-CM | POA: Diagnosis not present

## 2024-01-14 DIAGNOSIS — G7000 Myasthenia gravis without (acute) exacerbation: Secondary | ICD-10-CM | POA: Diagnosis not present

## 2024-01-14 DIAGNOSIS — H5712 Ocular pain, left eye: Secondary | ICD-10-CM | POA: Diagnosis present

## 2024-01-14 DIAGNOSIS — H25812 Combined forms of age-related cataract, left eye: Secondary | ICD-10-CM | POA: Diagnosis present

## 2024-01-14 HISTORY — PX: CATARACT EXTRACTION W/PHACO: SHX586

## 2024-01-14 HISTORY — PX: INSERTION, STENT, DRUG-ELUTING, LACRIMAL CANALICULUS: SHX7453

## 2024-01-14 SURGERY — PHACOEMULSIFICATION, CATARACT, WITH IOL INSERTION
Anesthesia: Monitor Anesthesia Care | Site: Eye | Laterality: Left

## 2024-01-14 MED ORDER — STERILE WATER FOR IRRIGATION IR SOLN
Status: DC | PRN
  Administered 2024-01-14: 1

## 2024-01-14 MED ORDER — DEXAMETHASONE 0.4 MG OP INST
VAGINAL_INSERT | OPHTHALMIC | Status: AC
  Filled 2024-01-14: qty 1

## 2024-01-14 MED ORDER — TROPICAMIDE 1 % OP SOLN
1.0000 [drp] | OPHTHALMIC | Status: AC | PRN
  Administered 2024-01-14 (×3): 1 [drp] via OPHTHALMIC

## 2024-01-14 MED ORDER — DEXAMETHASONE 0.4 MG OP INST
VAGINAL_INSERT | OPHTHALMIC | Status: DC | PRN
  Administered 2024-01-14: .4 mg via OPHTHALMIC

## 2024-01-14 MED ORDER — MIDAZOLAM HCL 2 MG/2ML IJ SOLN
INTRAMUSCULAR | Status: AC
  Filled 2024-01-14: qty 2

## 2024-01-14 MED ORDER — LIDOCAINE HCL (PF) 1 % IJ SOLN
INTRAMUSCULAR | Status: DC | PRN
  Administered 2024-01-14: 1 mL

## 2024-01-14 MED ORDER — LACTATED RINGERS IV SOLN: INTRAVENOUS | Status: DC

## 2024-01-14 MED ORDER — LIDOCAINE HCL 3.5 % OP GEL
1.0000 | Freq: Once | OPHTHALMIC | Status: AC
  Administered 2024-01-14: 1 via OPHTHALMIC

## 2024-01-14 MED ORDER — PHENYLEPHRINE-KETOROLAC 1-0.3 % IO SOLN
INTRAOCULAR | Status: DC | PRN
  Administered 2024-01-14: 500 mL via OPHTHALMIC

## 2024-01-14 MED ORDER — POVIDONE-IODINE 5 % OP SOLN
OPHTHALMIC | Status: DC | PRN
  Administered 2024-01-14: 1 via OPHTHALMIC

## 2024-01-14 MED ORDER — SODIUM HYALURONATE 23MG/ML IO SOSY
PREFILLED_SYRINGE | INTRAOCULAR | Status: DC | PRN
  Administered 2024-01-14: .6 mL via INTRAOCULAR

## 2024-01-14 MED ORDER — PHENYLEPHRINE HCL 2.5 % OP SOLN
1.0000 [drp] | OPHTHALMIC | Status: AC | PRN
  Administered 2024-01-14 (×3): 1 [drp] via OPHTHALMIC

## 2024-01-14 MED ORDER — BSS IO SOLN
INTRAOCULAR | Status: DC | PRN
  Administered 2024-01-14: 15 mL via INTRAOCULAR

## 2024-01-14 MED ORDER — TETRACAINE HCL 0.5 % OP SOLN
1.0000 [drp] | OPHTHALMIC | Status: AC | PRN
  Administered 2024-01-14 (×3): 1 [drp] via OPHTHALMIC

## 2024-01-14 MED ORDER — MOXIFLOXACIN HCL 5 MG/ML IO SOLN
INTRAOCULAR | Status: DC | PRN
  Administered 2024-01-14: .2 mL via INTRACAMERAL

## 2024-01-14 MED ORDER — SODIUM HYALURONATE 10 MG/ML IO SOLUTION
PREFILLED_SYRINGE | INTRAOCULAR | Status: DC | PRN
  Administered 2024-01-14: .85 mL via INTRAOCULAR

## 2024-01-14 SURGICAL SUPPLY — 11 items
CLOTH BEACON ORANGE TIMEOUT ST (SAFETY) ×1 IMPLANT
EYE SHIELD UNIVERSAL CLEAR (GAUZE/BANDAGES/DRESSINGS) IMPLANT
FEE CATARACT SUITE SIGHTPATH (MISCELLANEOUS) ×1 IMPLANT
GLOVE BIOGEL PI IND STRL 7.0 (GLOVE) ×2 IMPLANT
LENS IOL TECNIS EYHANCE 21.5 (Intraocular Lens) IMPLANT
NDL HYPO 18GX1.5 BLUNT FILL (NEEDLE) ×1 IMPLANT
NEEDLE HYPO 18GX1.5 BLUNT FILL (NEEDLE) ×1 IMPLANT
PAD ARMBOARD POSITIONER FOAM (MISCELLANEOUS) ×1 IMPLANT
SYR TB 1ML LL NO SAFETY (SYRINGE) ×1 IMPLANT
TAPE SURG TRANSPORE 1 IN (GAUZE/BANDAGES/DRESSINGS) IMPLANT
WATER STERILE IRR 250ML POUR (IV SOLUTION) ×1 IMPLANT

## 2024-01-14 NOTE — Anesthesia Postprocedure Evaluation (Signed)
 Anesthesia Post Note  Patient: Makaveli Hoard.  Procedure(s) Performed: PHACOEMULSIFICATION, CATARACT, WITH IOL INSERTION (Left: Eye) INSERTION, STENT, DRUG-ELUTING, LACRIMAL CANALICULUS (Left: Eye)  Patient location during evaluation: PACU Anesthesia Type: MAC Level of consciousness: awake and alert Pain management: pain level controlled Vital Signs Assessment: post-procedure vital signs reviewed and stable Respiratory status: spontaneous breathing, nonlabored ventilation, respiratory function stable and patient connected to nasal cannula oxygen Cardiovascular status: stable and blood pressure returned to baseline Postop Assessment: no apparent nausea or vomiting Anesthetic complications: no   There were no known notable events for this encounter.   Last Vitals:  Vitals:   01/14/24 0956 01/14/24 1048  BP: (!) 149/61 126/66  Pulse: (!) 52 60  Resp: 12 16  Temp: 36.6 C 36.5 C  SpO2: 100% 100%    Last Pain:  Vitals:   01/14/24 1048  TempSrc: Oral  PainSc: 0-No pain                 Lamonica Trueba L Nyzir Dubois

## 2024-01-14 NOTE — Interval H&P Note (Signed)
 History and Physical Interval Note:  01/14/2024 10:20 AM  Kyle [name].  has presented today for surgery, with the diagnosis of combined forms age related cataract, left eye.  The various methods of treatment have been discussed with the patient and family. After consideration of risks, benefits and other options for treatment, the patient has consented to  Procedure(s) with comments: PHACOEMULSIFICATION, CATARACT, WITH IOL INSERTION (Left) - CDE: INSERTION, STENT, DRUG-ELUTING, LACRIMAL CANALICULUS (Left) as a surgical intervention.  The patient's history has been reviewed, patient examined, no change in status, stable for surgery.  I have reviewed the patient's chart and labs.  Questions were answered to the patient's satisfaction.     HARRIE AGENT

## 2024-01-14 NOTE — Discharge Instructions (Addendum)
 Please discharge patient when stable, will follow up today with Dr. June Leap at the [facility] office immediately following discharge.  Leave shield in place until visit.  All paperwork with discharge instructions will be given at the office.  [facility] Address:  [address]  [place], [place] [address]

## 2024-01-14 NOTE — Anesthesia Procedure Notes (Signed)
 Date/Time: 01/14/2024 10:26 AM  Performed by: Barbarann Verneita RAMAN, CRNAPre-anesthesia Checklist: Patient identified, Emergency Drugs available, Suction available, Timeout performed and Patient being monitored Patient Re-evaluated:Patient Re-evaluated prior to induction Oxygen Delivery Method: Nasal Cannula

## 2024-01-14 NOTE — Anesthesia Preprocedure Evaluation (Addendum)
 Anesthesia Evaluation  Patient identified by MRN, date of birth, ID band Patient awake    Reviewed: Allergy & Precautions, H&P , NPO status , Patient's Chart, lab work & pertinent test results, reviewed documented beta blocker date and time   History of Anesthesia Complications (+) history of anesthetic complications  Airway Mallampati: II  TM Distance: >3 FB Neck ROM: full    Dental no notable dental hx. (+) Dental Advisory Given, Teeth Intact, Missing,    Pulmonary neg pulmonary ROS   Pulmonary exam normal breath sounds clear to auscultation       Cardiovascular hypertension, Normal cardiovascular exam+ dysrhythmias Atrial Fibrillation + Valvular Problems/Murmurs  Rhythm:regular Rate:Normal  TAVR. Echo OK   Neuro/Psych Seizures -,  Myasthenia gravis  Neuromuscular disease  negative psych ROS   GI/Hepatic negative GI ROS, Neg liver ROS,,,  Endo/Other  negative endocrine ROS    Renal/GU CRFRenal disease  negative genitourinary   Musculoskeletal  (+) Arthritis , Osteoarthritis,    Abdominal   Peds  Hematology negative hematology ROS (+)   Anesthesia Other Findings Ocular Myasthesia gravis  Reproductive/Obstetrics negative OB ROS                              Anesthesia Physical Anesthesia Plan  ASA: 3  Anesthesia Plan: MAC   Post-op Pain Management: Minimal or no pain anticipated   Induction:   PONV Risk Score and Plan:   Airway Management Planned: Nasal Cannula and Natural Airway  Additional Equipment: None  Intra-op Plan:   Post-operative Plan:   Informed Consent: I have reviewed the patients History and Physical, chart, labs and discussed the procedure including the risks, benefits and alternatives for the proposed anesthesia with the patient or authorized representative who has indicated his/her understanding and acceptance.     Dental Advisory Given  Plan Discussed  with: CRNA  Anesthesia Plan Comments:          Anesthesia Quick Evaluation

## 2024-01-14 NOTE — Transfer of Care (Signed)
 Immediate Anesthesia Transfer of Care Note  Patient: Kyle [name].  Procedure(s) Performed: PHACOEMULSIFICATION, CATARACT, WITH IOL INSERTION (Left: Eye) INSERTION, STENT, DRUG-ELUTING, LACRIMAL CANALICULUS (Left: Eye)  Patient Location: Short Stay  Anesthesia Type:MAC  Level of Consciousness: awake and alert   Airway & Oxygen Therapy: Patient Spontanous Breathing  Post-op Assessment: Report given to RN and Post -op Vital signs reviewed and stable  Post vital signs: Reviewed and stable  Last Vitals:  Vitals Value Taken Time  BP 126/66 01/14/24 10:48  Temp 36.5 C 01/14/24 10:48  Pulse 60 01/14/24 10:48  Resp 16 01/14/24 10:48  SpO2 100 % 01/14/24 10:48    Last Pain:  Vitals:   01/14/24 1048  TempSrc: Oral  PainSc: 0-No pain      Patients Stated Pain Goal: 6 (01/14/24 0956)  Complications: No notable events documented.

## 2024-01-14 NOTE — Op Note (Addendum)
 Date of procedure: 01/14/24  Pre-operative diagnosis:  Visually significant combined form age-related cataract, Left Eye (H25.811)  Post-operative diagnosis:   1. Visually significant combined form age-related cataract, Left Eye (H25.811) 2. Pain and inflammation following cataract surgery Left Eye (H57.11)  Procedure:  Removal of cataract via phacoemulsification and insertion of intra-ocular lens Johnson and Johnson DIB00 +21.5D into the capsular bag of the Left Eye 2. Placement of Dextenza  insert, Left Eye  Attending surgeon: Lynwood LABOR. Lytle Malburg, MD, MA  Anesthesia: MAC, Topical Akten  Complications: None  Estimated Blood Loss: <10mL (minimal)  Specimens: None  Implants: As above  Indications:  Visually significant age-related cataract, Left Eye  Procedure:  The patient was seen and identified in the pre-operative area. The operative eye was identified and dilated.  The operative eye was marked.  Topical anesthesia was administered to the operative eye.     The patient was then to the operative suite and placed in the supine position.  A timeout was performed confirming the patient, procedure to be performed, and all other relevant information.   The patient's face was prepped and draped in the usual fashion for intra-ocular surgery.  A lid speculum was placed into the operative eye and the surgical microscope moved into place and focused.  A superotemporal paracentesis was created using a 20 gauge paracentesis blade. Omidria  was injected into the anterior chamber. Shugarcaine was injected into the anterior chamber.  Viscoelastic was injected into the anterior chamber.  A temporal clear-corneal main wound incision was created using a 2.25mm microkeratome.  A continuous curvilinear capsulorrhexis was initiated using an irrigating cystitome and completed using capsulorrhexis forceps.  Hydrodissection and hydrodeliniation were performed.  Viscoelastic was injected into the anterior chamber.  A  phacoemulsification handpiece and a chopper as a second instrument were used to remove the nucleus and epinucleus. The irrigation/aspiration handpiece was used to remove any remaining cortical material.   The capsular bag was reinflated with viscoelastic, checked, and found to be intact.  The intraocular lens was inserted into the capsular bag.  The irrigation/aspiration handpiece was used to remove any remaining viscoelastic.  The clear corneal wound and paracentesis wounds were then hydrated and checked with Weck-Cels to be watertight. 0.1mL of moxifloxacin was injected into the anterior chamber.  The lid-speculum was removed. The lower punctum was dilated. A Dextenza  implant was placed in the lower canaliculus without complication.  The drape was removed.  The patient's face was cleaned with a wet and dry 4x4. A clear shield was taped over the eye. The patient was taken to the post-operative care unit in good condition, having tolerated the procedure well.  Post-Op Instructions: The patient will follow up at [facility] for a same day post-operative evaluation and will receive all other orders and instructions.

## 2024-01-18 ENCOUNTER — Encounter: Payer: Self-pay | Admitting: General Surgery

## 2024-01-18 ENCOUNTER — Ambulatory Visit (INDEPENDENT_AMBULATORY_CARE_PROVIDER_SITE_OTHER): Admitting: General Surgery

## 2024-01-18 VITALS — BP 136/55 | HR 57 | Temp 98.0°F | Resp 16 | Ht 67.0 in | Wt 150.0 lb

## 2024-01-18 DIAGNOSIS — Z09 Encounter for follow-up examination after completed treatment for conditions other than malignant neoplasm: Secondary | ICD-10-CM

## 2024-01-19 NOTE — Progress Notes (Signed)
 Subjective:     Kyle [name].  Patient here for follow-up of seroma of left groin.  Patient overall is doing well.  He states he has had minimal recurrence of the seroma. Objective:    BP (!) 136/55   Pulse (!) 57   Temp 98 F (36.7 C) (Oral)   Resp 16   Ht 5' 7 (1.702 m)   Wt 150 lb (68 kg)   SpO2 95%   BMI 23.49 kg/m   General:  alert, cooperative, and no distress  Abdomen soft, incisions well-healed.  A 20 cc seroma was aspirated from the left groin.     Assessment:    Postoperative seroma, left groin, resolved    Plan:   I told the patient to return to my care should he have any significant recurrence of the seroma.  Follow-up here as needed.

## 2024-02-03 ENCOUNTER — Other Ambulatory Visit: Payer: Self-pay | Admitting: Cardiology

## 2024-02-04 ENCOUNTER — Other Ambulatory Visit (HOSPITAL_COMMUNITY): Payer: Self-pay | Admitting: Endocrinology

## 2024-02-07 ENCOUNTER — Encounter (HOSPITAL_COMMUNITY)
Admission: RE | Admit: 2024-02-07 | Discharge: 2024-02-07 | Disposition: A | Discharge status: Home / Self Care | Source: Ambulatory Visit | Attending: Ophthalmology | Admitting: Ophthalmology

## 2024-02-07 NOTE — Pre-Procedure Instructions (Signed)
 Attempted pre-op phone call. Left VM for him to call us back.

## 2024-02-07 NOTE — H&P (Signed)
 Surgical History & Physical  Patient Name: Kyle [name]  DOB: 12-25-1934  Surgery: Cataract extraction with intraocular lens implant phacoemulsification; Right Eye Surgeon: Lynwood Hermann MD Surgery Date: 02/11/2024 Pre-Op Date: 01/20/2024  HPI: A 36 Yr. old male patient 1. The patient is returning after cataract surgery. The left eye is affected. Status post cataract surgery, which began 1 weeks ago: Since the last visit, the affected area feels improvement and is doing well. The patient's vision is improved. The patient experiences no diplopia. The condition's severity is constant. Patient is following medication instructions. The patient experiences no flashes, floater, shadow, curtain or veil. OD is blurry- C/O blurry/hazy VA, poor night VA, difficulty seeing signs, seeing captions, glare day and night. Blurry vision is constant and worsening. This is negatively affecting the patient's quality of life and the patient is unable to function adequately in life with the current level of vision. HPI was performed by Lynwood Hermann .  Medical History: PCIOL OS  Arthritis Heart Problem High Blood Pressure Seizure, Myasthenia Gravis, Bullous Pemphigoid  Review of Systems Cardiovascular High Blood Pressure, Afib Musculoskeletal Arthritis All recorded systems are negative except as noted above.  Social Never smoked  Medication Prednisolone-moxiflox-bromfen,  Keppra  (seizure), Pyridostigmine , Niacinamide , Eliquis , Garlic, Hydrocodone , Gabapentin , Tizandine , Gabapentin , Hydrocodone -acetaminophen , Levetiracetam , Tizanidine , Amiodarone , Pyridostigmine  bromide, Losartan , Diclofenac  sodium, Simvastatin   Sx/Procedures Phaco c IOL OS-dextenza ,  Brain Tumor Sx, Gallbladder Sx, Prostate Seeding, Part of colon and bladder removed  Drug Allergies  Sulfa, CT Scan Dye, Sulfa, CAT scan dye  History & Physical: Heent: cataract NECK: supple without bruits LUNGS: lungs clear to auscultation CV:  regular rate and rhythm Abdomen: soft and non-tender  Impression & Plan: Assessment: 1.  CATARACT EXTRACTION STATUS; Left Eye (Z98.42) 2.  COMBINED FORMS AGE RELATED CATARACT; Both Eyes (H25.813)  Plan: 1.  1 week after cataract surgery. Doing well with improved vision and normal eye pressure. Call with any problems or concerns. Continue Pred-Mox-Brom Combo drop 2x/day for 1 more week.  2.  Cataract accounts for the patient's decreased vision. This visual impairment is not correctable with a tolerable change in glasses or contact lenses. Cataract surgery with an implantation of a new lens should significantly improve the visual and functional status of the patient. Discussed all risks, benefits, alternatives, and potential complications. Discussed the procedures and recovery. Patient desires to have surgery. A-scan ordered and performed today for intra-ocular lens calculations. The surgery will be performed in order to improve vision for driving, reading, and for eye examinations. Recommend phacoemulsification with intra-ocular lens. Recommend Dextenza  for post-operative pain and inflammation. History of corneal refractive Surgery: None History of Previous Ocular Surgery (PPV, other): None History of ocular trauma: None Use of Eye Pressure Lowering Drops: None Right Eye. Dilates poorly - shugarcaine by protocol. Malyugin Ring. Omidira. Standard IOL.

## 2024-02-11 ENCOUNTER — Ambulatory Visit (HOSPITAL_COMMUNITY)

## 2024-02-11 ENCOUNTER — Other Ambulatory Visit: Payer: Self-pay

## 2024-02-11 ENCOUNTER — Encounter (HOSPITAL_COMMUNITY)
Admission: RE | Disposition: A | Discharge status: Home / Self Care | Payer: Self-pay | Source: Home / Self Care | Attending: Ophthalmology

## 2024-02-11 ENCOUNTER — Encounter (HOSPITAL_COMMUNITY): Payer: Self-pay | Admitting: Ophthalmology

## 2024-02-11 ENCOUNTER — Ambulatory Visit (HOSPITAL_COMMUNITY)
Admission: RE | Admit: 2024-02-11 | Discharge: 2024-02-11 | Disposition: A | Discharge status: Home / Self Care | Attending: Ophthalmology | Admitting: Ophthalmology

## 2024-02-11 DIAGNOSIS — I4891 Unspecified atrial fibrillation: Secondary | ICD-10-CM | POA: Insufficient documentation

## 2024-02-11 DIAGNOSIS — M1990 Unspecified osteoarthritis, unspecified site: Secondary | ICD-10-CM | POA: Insufficient documentation

## 2024-02-11 DIAGNOSIS — H25811 Combined forms of age-related cataract, right eye: Secondary | ICD-10-CM

## 2024-02-11 DIAGNOSIS — I10 Essential (primary) hypertension: Secondary | ICD-10-CM | POA: Insufficient documentation

## 2024-02-11 DIAGNOSIS — E7800 Pure hypercholesterolemia, unspecified: Secondary | ICD-10-CM | POA: Diagnosis not present

## 2024-02-11 DIAGNOSIS — H5711 Ocular pain, right eye: Secondary | ICD-10-CM | POA: Diagnosis present

## 2024-02-11 HISTORY — PX: INSERTION, STENT, DRUG-ELUTING, LACRIMAL CANALICULUS: SHX7453

## 2024-02-11 HISTORY — PX: CATARACT EXTRACTION W/PHACO: SHX586

## 2024-02-11 SURGERY — PHACOEMULSIFICATION, CATARACT, WITH IOL INSERTION
Anesthesia: Monitor Anesthesia Care | Site: Eye | Laterality: Right

## 2024-02-11 MED ORDER — STERILE WATER FOR IRRIGATION IR SOLN
Status: DC | PRN
  Administered 2024-02-11: 1

## 2024-02-11 MED ORDER — LACTATED RINGERS IV SOLN: INTRAVENOUS | Status: DC

## 2024-02-11 MED ORDER — PHENYLEPHRINE HCL 2.5 % OP SOLN
1.0000 [drp] | OPHTHALMIC | Status: AC | PRN
  Administered 2024-02-11 (×3): 1 [drp] via OPHTHALMIC

## 2024-02-11 MED ORDER — LIDOCAINE HCL 3.5 % OP GEL
1.0000 | Freq: Once | OPHTHALMIC | Status: AC
  Administered 2024-02-11: 1 via OPHTHALMIC

## 2024-02-11 MED ORDER — PHENYLEPHRINE-KETOROLAC 1-0.3 % IO SOLN
INTRAOCULAR | Status: DC | PRN
  Administered 2024-02-11: 500 mL via OPHTHALMIC

## 2024-02-11 MED ORDER — LIDOCAINE HCL (PF) 1 % IJ SOLN
INTRAMUSCULAR | Status: DC | PRN
  Administered 2024-02-11: 1 mL

## 2024-02-11 MED ORDER — SODIUM HYALURONATE 23MG/ML IO SOSY
PREFILLED_SYRINGE | INTRAOCULAR | Status: DC | PRN
Start: 2024-02-11 — End: 2024-02-11
  Administered 2024-02-11: .6 mL via INTRAOCULAR

## 2024-02-11 MED ORDER — DEXAMETHASONE 0.4 MG OP INST
VAGINAL_INSERT | OPHTHALMIC | Status: DC | PRN
  Administered 2024-02-11: .4 mg via OPHTHALMIC

## 2024-02-11 MED ORDER — TETRACAINE HCL 0.5 % OP SOLN
1.0000 [drp] | OPHTHALMIC | Status: AC | PRN
  Administered 2024-02-11 (×3): 1 [drp] via OPHTHALMIC

## 2024-02-11 MED ORDER — TROPICAMIDE 1 % OP SOLN
1.0000 [drp] | OPHTHALMIC | Status: AC | PRN
  Administered 2024-02-11 (×3): 1 [drp] via OPHTHALMIC

## 2024-02-11 MED ORDER — POVIDONE-IODINE 5 % OP SOLN
OPHTHALMIC | Status: DC | PRN
Start: 2024-02-11 — End: 2024-02-11
  Administered 2024-02-11: 1 via OPHTHALMIC

## 2024-02-11 MED ORDER — DEXAMETHASONE 0.4 MG OP INST
VAGINAL_INSERT | OPHTHALMIC | Status: AC
  Filled 2024-02-11: qty 1

## 2024-02-11 MED ORDER — SODIUM HYALURONATE 10 MG/ML IO SOLUTION
PREFILLED_SYRINGE | INTRAOCULAR | Status: DC | PRN
Start: 2024-02-11 — End: 2024-02-11
  Administered 2024-02-11: .85 mL via INTRAOCULAR

## 2024-02-11 MED ORDER — BSS IO SOLN
INTRAOCULAR | Status: DC | PRN
  Administered 2024-02-11: 15 mL via INTRAOCULAR

## 2024-02-11 MED ORDER — MOXIFLOXACIN HCL 5 MG/ML IO SOLN
INTRAOCULAR | Status: DC | PRN
  Administered 2024-02-11: .3 mL via INTRACAMERAL

## 2024-02-11 SURGICAL SUPPLY — 11 items
CLOTH BEACON ORANGE TIMEOUT ST (SAFETY) ×1 IMPLANT
EYE SHIELD UNIVERSAL CLEAR (GAUZE/BANDAGES/DRESSINGS) IMPLANT
FEE CATARACT SUITE SIGHTPATH (MISCELLANEOUS) ×1 IMPLANT
GLOVE BIOGEL PI IND STRL 7.0 (GLOVE) ×2 IMPLANT
LENS IOL TECNIS EYHANCE 22.0 (Intraocular Lens) IMPLANT
NDL HYPO 18GX1.5 BLUNT FILL (NEEDLE) ×1 IMPLANT
NEEDLE HYPO 18GX1.5 BLUNT FILL (NEEDLE) ×1 IMPLANT
PAD ARMBOARD POSITIONER FOAM (MISCELLANEOUS) ×1 IMPLANT
SYR TB 1ML LL NO SAFETY (SYRINGE) ×1 IMPLANT
TAPE SURG TRANSPORE 1 IN (GAUZE/BANDAGES/DRESSINGS) IMPLANT
WATER STERILE IRR 250ML POUR (IV SOLUTION) ×1 IMPLANT

## 2024-02-11 NOTE — Anesthesia Procedure Notes (Signed)
 Date/Time: 02/11/2024 7:52 AM  Performed by: Para Jerelene CROME, CRNAOxygen Delivery Method: Nasal cannula

## 2024-02-11 NOTE — Addendum Note (Signed)
 Addendum  created 02/11/24 0847 by Para Jerelene CROME, CRNA   Clinical Note Signed

## 2024-02-11 NOTE — Transfer of Care (Addendum)
 Immediate Anesthesia Transfer of Care Note  Patient: Kyle [name].  Procedure(s) Performed: PHACOEMULSIFICATION, CATARACT, WITH IOL INSERTION (Right: Eye) INSERTION, STENT, DRUG-ELUTING, LACRIMAL CANALICULUS (Right: Eye)  Patient Location: Short Stay  Anesthesia Type:MAC  Level of Consciousness: awake and patient cooperative  Airway & Oxygen Therapy: Patient Spontanous Breathing  Post-op Assessment: Report given to RN and Post -op Vital signs reviewed and stable  Post vital signs: Reviewed and stable  Last Vitals:  Vitals Value Taken Time  BP 128/57 02/11/24   0813  Temp 36.7 02/11/24   0813  Pulse 49 02/11/24   0813  Resp 16 02/11/24   0813  SpO2 100% 02/11/24   0813    Last Pain:  Vitals:   02/11/24 0657  TempSrc: Oral  PainSc: 0-No pain         Complications: No notable events documented.

## 2024-02-11 NOTE — Interval H&P Note (Signed)
 History and Physical Interval Note:  02/11/2024 7:45 AM  Kyle [name].  has presented today for surgery, with the diagnosis of combined forms age related cataract, right eye.  The various methods of treatment have been discussed with the patient and family. After consideration of risks, benefits and other options for treatment, the patient has consented to  Procedure(s): PHACOEMULSIFICATION, CATARACT, WITH IOL INSERTION (Right) as a surgical intervention.  The patient's history has been reviewed, patient examined, no change in status, stable for surgery.  I have reviewed the patient's chart and labs.  Questions were answered to the patient's satisfaction.     HARRIE AGENT

## 2024-02-11 NOTE — Discharge Instructions (Addendum)
 Please discharge patient when stable, will follow up today with Dr. June Leap at the [facility] office immediately following discharge.  Leave shield in place until visit.  All paperwork with discharge instructions will be given at the office.  [facility] Address:  [address]  [place], [place] [address]

## 2024-02-11 NOTE — Op Note (Signed)
 Date of procedure: 02/11/24  Pre-operative diagnosis:  Visually significant combined form age-related cataract, Right Eye (H25.811)  Post-operative diagnosis:   1. Visually significant combined form age-related cataract, Right Eye (H25.811) 2. Pain and inflammation following cataract surgery Right Eye (H57.11)  Procedure:  Removal of cataract via phacoemulsification and insertion of intra-ocular lens Johnson and Johnson DIB00 +22.0D into the capsular bag of the Right Eye 2. Placement of Dextenza  insert, Right Eye  Attending surgeon: Lynwood LABOR. Lexie Morini, MD, MA  Anesthesia: MAC, Topical Akten  Complications: None  Estimated Blood Loss: <35mL (minimal)  Specimens: None  Implants: As above  Indications:  Visually significant age-related cataract, Right Eye  Procedure:  The patient was seen and identified in the pre-operative area. The operative eye was identified and dilated.  The operative eye was marked.  Topical anesthesia was administered to the operative eye.     The patient was then to the operative suite and placed in the supine position.  A timeout was performed confirming the patient, procedure to be performed, and all other relevant information.   The patient's face was prepped and draped in the usual fashion for intra-ocular surgery.  A lid speculum was placed into the operative eye and the surgical microscope moved into place and focused.  A superotemporal paracentesis was created using a 20 gauge paracentesis blade. Omidria  was injected into the anterior chamber. Shugarcaine was injected into the anterior chamber.  Viscoelastic was injected into the anterior chamber.  A temporal clear-corneal main wound incision was created using a 2.63mm microkeratome.  A continuous curvilinear capsulorrhexis was initiated using an irrigating cystitome and completed using capsulorrhexis forceps.  Hydrodissection and hydrodeliniation were performed.  Viscoelastic was injected into the anterior  chamber.  A phacoemulsification handpiece and a chopper as a second instrument were used to remove the nucleus and epinucleus. The irrigation/aspiration handpiece was used to remove any remaining cortical material.   The capsular bag was reinflated with viscoelastic, checked, and found to be intact.  The intraocular lens was inserted into the capsular bag.  The irrigation/aspiration handpiece was used to remove any remaining viscoelastic.  The clear corneal wound and paracentesis wounds were then hydrated and checked with Weck-Cels to be watertight. 0.1mL of moxifloxacin was injected into the anterior chamber.  The lid-speculum was removed. The lower punctum was dilated. A Dextenza  implant was placed in the lower canaliculus without complication.  The drape was removed.  The patient's face was cleaned with a wet and dry 4x4. A clear shield was taped over the eye. The patient was taken to the post-operative care unit in good condition, having tolerated the procedure well.  Post-Op Instructions: The patient will follow up at [facility] for a same day post-operative evaluation and will receive all other orders and instructions.

## 2024-02-11 NOTE — Anesthesia Postprocedure Evaluation (Signed)
 Anesthesia Post Note  Patient: Kyle [name].  Procedure(s) Performed: PHACOEMULSIFICATION, CATARACT, WITH IOL INSERTION (Right: Eye) INSERTION, STENT, DRUG-ELUTING, LACRIMAL CANALICULUS (Right: Eye)  Patient location during evaluation: Short Stay Anesthesia Type: MAC Level of consciousness: awake and alert Pain management: pain level controlled Vital Signs Assessment: post-procedure vital signs reviewed and stable Respiratory status: spontaneous breathing Cardiovascular status: blood pressure returned to baseline Postop Assessment: no apparent nausea or vomiting Anesthetic complications: no   No notable events documented.   Last Vitals:  Vitals:   02/11/24 0657 02/11/24 0813  BP: (!) 151/65 (!) 128/57  Pulse: (!) 48 (!) 49  Resp: 17 16  Temp: 36.5 C 36.7 C  SpO2: 99% 100%    Last Pain:  Vitals:   02/11/24 0813  TempSrc: Oral  PainSc: 0-No pain                 Belinda L Zair Borawski

## 2024-02-11 NOTE — Anesthesia Preprocedure Evaluation (Addendum)
 Anesthesia Evaluation  Patient identified by MRN, date of birth, ID band Patient awake    Reviewed: Allergy & Precautions, H&P , NPO status , Patient's Chart, lab work & pertinent test results  History of Anesthesia Complications (+) PROLONGED EMERGENCE and history of anesthetic complications  Airway Mallampati: II  TM Distance: >3 FB Neck ROM: Full    Dental  (+) Missing   Pulmonary neg pulmonary ROS   Pulmonary exam normal breath sounds clear to auscultation       Cardiovascular Exercise Tolerance: Poor hypertension, Normal cardiovascular exam+ dysrhythmias Atrial Fibrillation + Valvular Problems/Murmurs AS  Rhythm:Regular Rate:Normal  S/p aortic valve repair 5/25   Neuro/Psych Seizures -,  Seizure s/p brain surgery Last seizure 20 yrs ago  Neuromuscular disease  negative psych ROS   GI/Hepatic negative GI ROS, Neg liver ROS,,,  Endo/Other  negative endocrine ROS    Renal/GU Renal disease  negative genitourinary   Musculoskeletal  (+) Arthritis ,    Abdominal   Peds negative pediatric ROS (+)  Hematology negative hematology ROS (+)   Anesthesia Other Findings   Reproductive/Obstetrics negative OB ROS                              Anesthesia Physical Anesthesia Plan  ASA: 4  Anesthesia Plan: MAC   Post-op Pain Management:    Induction:   PONV Risk Score and Plan:   Airway Management Planned: Nasal Cannula  Additional Equipment:   Intra-op Plan:   Post-operative Plan:   Informed Consent: I have reviewed the patients History and Physical, chart, labs and discussed the procedure including the risks, benefits and alternatives for the proposed anesthesia with the patient or authorized representative who has indicated his/her understanding and acceptance.     Dental advisory given  Plan Discussed with: CRNA  Anesthesia Plan Comments:          Anesthesia Quick  Evaluation

## 2024-02-12 ENCOUNTER — Encounter (HOSPITAL_COMMUNITY): Payer: Self-pay | Admitting: Ophthalmology

## 2024-03-09 ENCOUNTER — Other Ambulatory Visit: Payer: Self-pay | Admitting: Internal Medicine

## 2024-03-09 LAB — T4, FREE: Free T4: 1.61 ng/dL (ref 0.82–1.77)

## 2024-03-09 LAB — T3, FREE: T3, Free: 3 pg/mL (ref 2.0–4.4)

## 2024-03-09 LAB — TSH: TSH: 0.815 u[IU]/mL (ref 0.450–4.500)

## 2024-03-16 ENCOUNTER — Ambulatory Visit: Admitting: "Endocrinology

## 2024-03-16 ENCOUNTER — Encounter: Payer: Self-pay | Admitting: "Endocrinology

## 2024-03-16 VITALS — BP 118/54 | HR 54 | Ht 66.0 in | Wt 150.0 lb

## 2024-03-16 DIAGNOSIS — E042 Nontoxic multinodular goiter: Secondary | ICD-10-CM | POA: Diagnosis not present
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
## 2024-03-24 ENCOUNTER — Encounter: Payer: Self-pay | Admitting: Internal Medicine

## 2024-03-24 ENCOUNTER — Ambulatory Visit: Attending: Internal Medicine | Admitting: Internal Medicine

## 2024-03-24 VITALS — BP 134/62 | HR 54 | Ht 67.0 in | Wt 149.4 lb

## 2024-03-24 DIAGNOSIS — I484 Atypical atrial flutter: Secondary | ICD-10-CM

## 2024-03-24 NOTE — Patient Instructions (Signed)
 Medication Instructions:  Your physician recommends that you continue on your current medications as directed. Please refer to the Current Medication list given to you today.   Labwork: None today  Testing/Procedures: None today  Follow-Up: 1 year Dr.Carlisle  Any Other Special Instructions Will Be Listed Below (If Applicable).  If you need a refill on your cardiac medications before your next appointment, please call your pharmacy.

## 2024-03-24 NOTE — Progress Notes (Signed)
 HPI Kyle [name] returns today for evaluation of atrial flutter. He is a pleasant 89 yo man with recurrent atrial flutter s/p multiple DCCV's. Review of his ECG's demonstrates atypical atrial flutter with a RVR. He was placed on amiodarone  after his last DCCV. He was thought to be a poor candidate for systemic anti-coag and underwent surgical LAA closure by Dr. Murriel. The patient had AS and underwent TAVR. He has recently had a hernia repair as well. He feels well but admits to being sedentary.  Allergies[1]   Current Outpatient Medications  Medication Sig Dispense Refill   amiodarone  (PACERONE ) 200 MG tablet TAKE 1 TABLET BY MOUTH DAILY ON  SATURDAY AND SUNDAY AND 1/2  TABLET DAILY MONDAY THROUGH  FRIDAY 65 tablet 2   aspirin  EC 81 MG tablet Take 1 tablet (81 mg total) by mouth daily. Swallow whole.     cholecalciferol (VITAMIN D3) 25 MCG (1000 UNIT) tablet Take 2,000 Units by mouth 2 (two) times daily.     clobetasol (TEMOVATE) 0.05 % external solution Apply 1 Application topically 2 (two) times daily as needed (blisters).     clobetasol cream (TEMOVATE) 0.05 % Apply 1 Application topically daily as needed (blisters).     diclofenac  (VOLTAREN ) 75 MG EC tablet Take 75 mg by mouth 2 (two) times daily.     fish oil-omega-3 fatty acids  1000 MG capsule Take 2 g by mouth 2 (two) times daily.     Flaxseed, Linseed, (FLAXSEED OIL) 1200 MG CAPS Take 1,200 mg by mouth daily.     gabapentin  (NEURONTIN ) 600 MG tablet Take 1 tablet (600 mg total) by mouth daily. 90 tablet 3   Garlic (GARLIQUE PO) Take 1 tablet by mouth daily.     HYDROcodone -acetaminophen  (NORCO/VICODIN) 5-325 MG tablet Take 1 tablet by mouth 2 (two) times daily.     levETIRAcetam  (KEPPRA ) 500 MG tablet TAKE ONE-HALF TABLET BY MOUTH IN THE MORNING, TAKE 1 TABLET IN  THE EVENING 140 tablet 3   losartan  (COZAAR ) 100 MG tablet TAKE 1 TABLET BY MOUTH DAILY 100 tablet 2   MAGNESIUM  CITRATE PO Take 400 mg by mouth at bedtime. Chew      niacinamide  500 MG tablet Take 500 mg by mouth 3 (three) times daily.     Polyethyl Glycol-Propyl Glycol 0.4-0.3 % SOLN Place 1 drop into both eyes 3 (three) times daily.     pyridostigmine  (MESTINON ) 60 MG tablet Take 1 tablet (60 mg total) by mouth 3 (three) times daily as needed. 270 tablet 3   simvastatin  (ZOCOR ) 20 MG tablet Take 10 mg by mouth at bedtime.     tiZANidine  (ZANAFLEX ) 4 MG tablet Take 4 mg by mouth at bedtime.     No current facility-administered medications for this visit.     Past Medical History:  Diagnosis Date   Arthritis    Atrial fibrillation (HCC)    Auto immune neutropenia    Bullous pemphigoid (HCC)    Cancer (HCC)    Prostate   Cervical spondylosis without myelopathy 01/22/2016   Chronic kidney disease    Per MD note 05/2021 CKD stage non specified   Complication of anesthesia    difficult to wake up after anesthesia   DVT (deep venous thrombosis) (HCC)    a. Has had a previous right lower extremity DVT in the setting of hospitalization and surgery (after his brain tumor was resected in 1993) but is no longer on Coumadin.   Dyslipidemia  Dysrhythmia    History of kidney stones    History of prostate cancer    Hypertension    Meningioma (HCC)    a. s/p resection 1990s.   Myasthenia (HCC) 02/14/2013   Nocturnal leg cramps    Ocular myasthenia gravis (HCC)    Peripheral neuropathy 11/23/2018   S/P TAVR (transcatheter aortic valve replacement) 08/24/2023   s/p TAVR with a 26 mm Edwards S3UR via the TF approach by Dr. Wendel and Dr. Lucas   Seizures [facility])    Sinus bradycardia    Sleep paralysis    Thyroid  nodule, cold     ROS:   All systems reviewed and negative except as noted in the HPI.   Past Surgical History:  Procedure Laterality Date   ABDOMINAL AORTOGRAM N/A 06/08/2023   Procedure: ABDOMINAL AORTOGRAM;  Surgeon: Wendel Lurena POUR, MD;  Location: [facility] INVASIVE CV LAB;  Service: Cardiovascular;  Laterality: N/A;   BLADDER SURGERY      removed part of the bladder   BRAIN SURGERY     CARDIAC VALVE REPLACEMENT  08/24/2023   CARDIOVERSION N/A 11/07/2020   Procedure: CARDIOVERSION;  Surgeon: Alvan Dorn FALCON, MD;  Location: [facility] ORS;  Service: Endoscopy;  Laterality: N/A;   CARDIOVERSION N/A 11/20/2021   Procedure: CARDIOVERSION;  Surgeon: Alvan Dorn FALCON, MD;  Location: [facility] ORS;  Service: Endoscopy;  Laterality: N/A;   CATARACT EXTRACTION W/PHACO Left 01/14/2024   Procedure: PHACOEMULSIFICATION, CATARACT, WITH IOL INSERTION;  Surgeon: Harrie Agent, MD;  Location: [facility] ORS;  Service: Ophthalmology;  Laterality: Left;  CDE: 7.56   CATARACT EXTRACTION W/PHACO Right 02/11/2024   Procedure: PHACOEMULSIFICATION, CATARACT, WITH IOL INSERTION;  Surgeon: Harrie Agent, MD;  Location: [facility] ORS;  Service: Ophthalmology;  Laterality: Right;  CDE 9.96   CHOLECYSTECTOMY     CLIPPING OF ATRIAL APPENDAGE N/A 04/20/2022   Procedure: CLIPPING OF ATRIAL APPENDAGE USING 45 ATRIAL CLIP;  Surgeon: Murriel Toribio DEL, MD;  Location: [facility] OR;  Service: Open Heart Surgery;  Laterality: N/A;   COLONOSCOPY N/A 07/25/2013   Procedure: COLONOSCOPY;  Surgeon: Oneil DELENA Budge, MD;  Location: [facility] ENDO SUITE;  Service: Gastroenterology;  Laterality: N/A;   COLONOSCOPY N/A 11/10/2016   Procedure: COLONOSCOPY;  Surgeon: Budge Oneil, MD;  Location: [facility] ENDO SU[facility];  Service: Gastroenterology;  Laterality: N/A;   INSERTION, STENT, DRUG-ELUTING, LACRIMAL CANALICULUS Left 01/14/2024   Procedure: INSERTION, STENT, DRUG-ELUTING, LACRIMAL CANALICULUS;  Surgeon: Harrie Agent, MD;  Location: [facility] ORS;  Service: Ophthalmology;  Laterality: Left;   INSERTION, STENT, DRUG-ELUTING, LACRIMAL CANALICULUS Right 02/11/2024   Procedure: INSERTION, STENT, DRUG-ELUTING, LACRIMAL CANALICULUS;  Surgeon: Harrie Agent, MD;  Location: [facility];  Service: Ophthalmology;  Laterality: Right;   INTRAOPERATIVE TRANSTHORACIC ECHOCARDIOGRAM N/A 08/24/2023   Procedure: ECHOCARDIOGRAM, TRANSTHORACIC;   Surgeon: Wendel Lurena POUR, MD;  Location: [facility] INVASIVE CV LAB;  Service: Cardiovascular;  Laterality: N/A;   IR CATHETER TUBE CHANGE  01/02/2017   IR RADIOLOGIST EVAL & MGMT  01/06/2017   IR RADIOLOGIST EVAL & MGMT  01/20/2017   IR RADIOLOGIST EVAL & MGMT  02/03/2017   IR RADIOLOGIST EVAL & MGMT  02/17/2017   IR SINUS/FIST TUBE CHK-NON GI  03/03/2017   PARTIAL COLECTOMY     RADIOACTIVE SEED IMPLANT     RIGHT/LEFT HEART CATH AND CORONARY ANGIOGRAPHY N/A 06/08/2023   Procedure: RIGHT/LEFT HEART CATH AND CORONARY ANGIOGRAPHY;  Surgeon: Wendel Lurena POUR, MD;  Location: [facility] INVASIVE CV LAB;  Service: Cardiovascular;  Laterality: N/A;   TEE WITHOUT CARDIOVERSION N/A 04/20/2022  Procedure: TRANSESOPHAGEAL ECHOCARDIOGRAM (TEE);  Surgeon: Murriel Toribio DEL, MD;  Location: [facility] OR;  Service: Open Heart Surgery;  Laterality: N/A;   VIDEO ASSISTED THORACOSCOPY Left 04/20/2022   Procedure: VIDEO ASSISTED THORACOSCOPY;  Surgeon: Murriel Toribio DEL, MD;  Location: [facility] OR;  Service: Thoracic;  Laterality: Left;   XI ROBOTIC ASSISTED INGUINAL HERNIA REPAIR WITH MESH Left 10/21/2023   Procedure: REPAIR, HERNIA, INGUINAL, ROBOT-ASSISTED, LAPAROSCOPIC, USING MESH;  Surgeon: Mavis Anes, MD;  Location: [facility] ORS;  Service: General;  Laterality: Left;     Family History  Problem Relation Age of Onset   Heart failure Mother    Diabetes Mother    Diabetes Sister    Dementia Sister    Lung cancer Son    Heart failure Daughter    Colon cancer Neg Hx      Social History   Socioeconomic History   Marital status: Married    Spouse name: Diplomatic Services Operational Officer   Number of children: 1   Years of education: 12 TH   Highest education level: Not on file  Occupational History   Occupation: retired  Tobacco Use   Smoking status: Never    Passive exposure: Never   Smokeless tobacco: Never  Vaping Use   Vaping status: Never Used  Substance and Sexual Activity   Alcohol  use: No   Drug use: No   Sexual activity: Not on file   Other Topics Concern   Not on file  Social History Narrative   Patient is right handed.   Patient drinks 1 cup of coffee daily.   Social Drivers of Health   Tobacco Use: Low Risk (03/24/2024)   Patient History    Smoking Tobacco Use: Never    Smokeless Tobacco Use: Never    Passive Exposure: Never  Financial Resource Strain: Low Risk (12/15/2022)   Overall Financial Resource Strain (CARDIA)    Difficulty of Paying Living Expenses: Not hard at all  Food Insecurity: No Food Insecurity (08/24/2023)   Hunger Vital Sign    Worried About Running Out of Food in the Last Year: Never true    Ran Out of Food in the Last Year: Never true  Transportation Needs: No Transportation Needs (08/24/2023)   PRAPARE - Administrator, Civil Service (Medical): No    Lack of Transportation (Non-Medical): No  Physical Activity: Sufficiently Active (11/09/2022)   Exercise Vital Sign    Days of Exercise per Week: 7 days    Minutes of Exercise per Session: 30 min  Stress: Not on file  Social Connections: Socially Integrated (08/24/2023)   Social Connection and Isolation Panel    Frequency of Communication with Friends and Family: Twice a week    Frequency of Social Gatherings with Friends and Family: Once a week    Attends Religious Services: 1 to 4 times per year    Active Member of [employer] or Organizations: Yes    Attends Banker Meetings: 1 to 4 times per year    Marital Status: Married  Catering Manager Violence: Not At Risk (08/24/2023)   Humiliation, Afraid, Rape, and Kick questionnaire    Fear of Current or Ex-Partner: No    Emotionally Abused: No    Physically Abused: No    Sexually Abused: No  Depression (PHQ2-9): Not on file  Alcohol  Screen: Not on file  Housing: Low Risk (08/24/2023)   Housing Stability Vital Sign    Unable to Pay for Housing in the Last Year: No    Number  of Times Moved in the Last Year: 0    Homeless in the Last Year: No  Utilities: Not At Risk  (08/24/2023)   AHC Utilities    Threatened with loss of utilities: No  Health Literacy: Not on file     BP 134/62 (BP Location: Left Arm)   Pulse (!) 54   Ht 5' 7 (1.702 m)   Wt 149 lb 6.4 oz (67.8 kg)   SpO2 97%   BMI 23.40 kg/m   Physical Exam:  Well appearing NAD HEENT: Unremarkable Neck:  No JVD, no thyromegally Lymphatics:  No adenopathy Back:  No CVA tenderness Lungs:  Clear with no wheezes HEART:  Regular rate rhythm, soft AI murmur, no rubs, no clicks Abd:  soft, positive bowel sounds, no organomegally, no rebound, no guarding Ext:  2 plus pulses, no edema, no cyanosis, no clubbing Skin:  No rashes no nodules Neuro:  CN II through XII intact, motor grossly intact   Assess/Plan:  Recurrent LA flutter - he will continue low dose amiodarone . He is maintaining NSR very nicely. Sinus node dysfunction - no evidence for the need for PPM at this time but we will undergo watchful waiting.  HTN - his b p is elevated but is usually much better at home.  AS - he has undergone TAVR. He has a minimal amount of AI on exam.    Phyillis Dascoli,MD    [1]  Allergies Allergen Reactions   Iodinated Contrast Media Hives     pt was premedicated/hives reaction occurred 24 hours post injection, Onset Date: [id number]    Sulfa Antibiotics Hives, Swelling and Rash   Bactericin [Bacitracin] Other (See Comments)    Unknown reaction    Iodine  Other (See Comments)   Norvasc  [Amlodipine ] Swelling    Leg swelling   Sulfur Other (See Comments)

## 2024-03-28 ENCOUNTER — Ambulatory Visit: Admitting: Cardiology

## 2024-04-18 ENCOUNTER — Telehealth: Payer: Self-pay | Admitting: Internal Medicine

## 2024-04-18 NOTE — Telephone Encounter (Signed)
 Pt came into the [place] office stating that he has not received his amiodarone  (PACERONE ) 200 MG tablet [[id number]]  From [facility]

## 2024-04-21 MED ORDER — AMIODARONE HCL 200 MG PO TABS: ORAL_TABLET | ORAL | 3 refills | Status: AC

## 2024-04-21 NOTE — Telephone Encounter (Signed)
 Refills has been sent to the pharmacy.

## 2024-05-10 NOTE — Progress Notes (Unsigned)
 "  Cardiology Office Note    Date:  05/11/2024  ID:  Kyle GORMAN [name]., DOB 10-26-1934, MRN [id number] Cardiologist: Alvan Carrier, MD Electrophysiologist:  Danelle Birmingham, MD  Structural Heart:  Lurena MARLA Red, MD{ :  History of Present Illness:    Kyle [name]. is a 89 y.o. male with past medical history of persistent atrial flutter (s/p DCCV in 10/2020 and 11/2021, prior thoracoscopic closure of LAA in 04/2022, recurrent flutter in 12/2022 and s/p repeat DCCV), CAD (cath in 05/2023 showing minimal nonobstructive CAD), HTN, HLD, multinodular goiter (followed by Endocrinology) and aortic stenosis (s/p TAVR in 08/2023) who presents to the office today for 4-month follow-up.  He was examined by myself in 09/2023 and reported overall feeling well at that time and denied any recent anginal symptoms. He was continued on his current cardiac medications with ASA 81 mg daily, Amiodarone  200 mg on weekends and then 100 mg daily during the week, Losartan  100 mg daily and Simvastatin  10 mg daily. It was recommended if LDL was above goal, would need to switch Simvastatin  to Crestor since dose adjustment of this was limited given the concurrent use of Amiodarone . He was examined by Dr. Birmingham in 03/2024 and was overall sedentary at that time but denied any anginal symptoms. Was maintaining normal sinus rhythm and no changes were made to his medications.  In talking with the patient today, he reports things have overall been stable since his last office visit. He does report progressive weakness along his legs and is unsure if this is due to his myasthenia gravis or secondary to age. He does carry a cane with him but does not use this routinely. He denies any recent chest pain or dyspnea on exertion. No recent palpitations, orthopnea, PND or pitting edema. Does have occasional swelling along his right ankle which has been present for many years. He does check his blood pressure at home and this has overall  been well-controlled with SBP typically in the 130's.  Studies Reviewed:   EKG: EKG is ordered today and demonstrates:   EKG Interpretation Date/Time:  Thursday May 11 2024 13:01:45 EST Ventricular Rate:  53 PR Interval:  186 QRS Duration:  90 QT Interval:  516 QTC Calculation: 484 R Axis:   46  Text Interpretation: Sinus bradycardia ST abnormality along anterolateral leads as noted on prior tracings. Confirmed by Johnson Grate ([id number]) on 05/11/2024 1:06:51 PM       R/LHC: 05/2023   Mid Cx lesion is 40% stenosed.   1.  Minimal, nonobstructive coronary artery disease. 2.  Fick cardiac output of 7.3 L/min and Fick cardiac index of 4.0 L/min/m with the following hemodynamics:            Right atrial pressure mean of 9 mmHg            Right ventricular pressure 41/2 with an end-diastolic pressure of 9 mmHg            Wedge pressure mean of 13 mmHg with V waves to 22 mmHg            PA pressure 45/14 with a mean of 28 mmHg            PVR of 2.1 Woods units            PA pulsatility index of 3.4 3.  Capacious iliofemoral vessels bilaterally.   Recommendation: Continue evaluation for aortic valve intervention.   Echocardiogram: 09/2023 IMPRESSIONS     1.  Left ventricular ejection fraction, by estimation, is 60 to 65%. The  left ventricle has normal function. The left ventricle has no regional  wall motion abnormalities. There is moderate left ventricular hypertrophy  of the septal segment. Left  ventricular diastolic function could not be evaluated.   2. Right ventricular systolic function is normal. The right ventricular  size is mildly enlarged. There is mildly elevated pulmonary artery  systolic pressure. The estimated right ventricular systolic pressure is  40.7 mmHg.   3. Left atrial size was severely dilated.   4. Right atrial size was severely dilated.   5. The mitral valve is normal in structure. Mild mitral valve  regurgitation. No evidence of mitral  stenosis.   6. The tricuspid valve is abnormal. Tricuspid valve regurgitation is mild  to moderate.   7. The aortic valve has been repaired/replaced. There is mild to moderate  paravalvular leak along the intervalvular fibrosa and ventricular surface  of anterior mitral valve leaflet. Aortic valve regurgitation is not  visualized. No aortic stenosis is  present. There is a 26 mm Sapien prosthetic (TAVR) valve present in the  aortic position. Procedure Date: 08/24/2023. Aortic valve mean gradient  measures 13.0 mmHg (increased from 4.4 mm Hg on Echo performed on  08/25/2023).   8. The inferior vena cava is normal in size with greater than 50%  respiratory variability, suggesting right atrial pressure of 3 mmHg.    Physical Exam:   VS:  BP 136/62 (BP Location: Left Arm, Cuff Size: Normal)   Pulse (!) 53   Ht 5' 7 (1.702 m)   Wt 148 lb 9.6 oz (67.4 kg)   SpO2 97%   BMI 23.27 kg/m    Wt Readings from Last 3 Encounters:  05/11/24 148 lb 9.6 oz (67.4 kg)  03/24/24 149 lb 6.4 oz (67.8 kg)  03/16/24 150 lb (68 kg)     GEN: Pleasant, elderly male appearing in no acute distress NECK: No JVD; No carotid bruits CARDIAC: Regular rhythm, bradycardiac rate, no murmurs, rubs, gallops RESPIRATORY:  Clear to auscultation without rales, wheezing or rhonchi  ABDOMEN: Appears non-distended. No obvious abdominal masses. EXTREMITIES: No clubbing or cyanosis. No pitting edema.  Distal pedal pulses are 2+ bilaterally.   Assessment and Plan:   1. S/P TAVR (transcatheter aortic valve replacement) - He previously underwent TAVR in 08/2023 and most recent echocardiogram in 09/2023 showed this was functioning normally with mean gradient at 13 mmHg. He did have mild to moderate paravalvular leak. Was recommended to plan for a follow-up echocardiogram in 1 year and this has already been scheduled for 08/28/2024.  2. Coronary artery disease involving native coronary artery of native heart without angina  pectoris - Cardiac catheterization in 05/2023 only showed 40% mid LCx stenosis with no obstructive disease. He denies any recent anginal symptoms.  - Continue current medical therapy with ASA 81 mg daily and Simvastatin  10 mg daily.   3. PAF (paroxysmal atrial fibrillation) (HCC) - He has undergone multiple cardioversions in the past with most recent in 12/2022. He has been continued on Amiodarone  per EP and takes 100 mg on weekdays and 200 mg daily on the weekend. TSH was normal when checked in 02/2024. Will recheck LFT's. Not on AV nodal blocking agents given baseline bradycardia.  - He is no longer on anticoagulation given prior thoracoscopic closure of LAA in 04/2022.  4. Hyperlipidemia LDL goal <70 - His LDL was at 77 in 09/2023. Simvastatin  dose was previously reduced to 10  mg daily given the concurrent use of Amiodarone  and he did not wish to switch to Crestor at the time of his last visit. Will order a follow-up FLP and CMET.   5. Essential hypertension, benign - His blood pressure is well-controlled at 136/62 during today's visit. Continue current medical therapy with Losartan  100 mg daily. Creatinine was stable at 1.2 when checked in 09/2023.   Signed, Laymon CHRISTELLA Qua, PA-C   "

## 2024-05-11 ENCOUNTER — Ambulatory Visit: Admitting: Student

## 2024-05-11 ENCOUNTER — Encounter: Payer: Self-pay | Admitting: Student

## 2024-05-11 VITALS — BP 136/62 | HR 53 | Ht 67.0 in | Wt 148.6 lb

## 2024-05-11 DIAGNOSIS — I480 Paroxysmal atrial fibrillation: Secondary | ICD-10-CM

## 2024-05-11 DIAGNOSIS — E785 Hyperlipidemia, unspecified: Secondary | ICD-10-CM | POA: Diagnosis not present

## 2024-05-11 DIAGNOSIS — Z952 Presence of prosthetic heart valve: Secondary | ICD-10-CM | POA: Diagnosis not present

## 2024-05-11 DIAGNOSIS — I2510 Atherosclerotic heart disease of native coronary artery without angina pectoris: Secondary | ICD-10-CM

## 2024-05-11 DIAGNOSIS — I10 Essential (primary) hypertension: Secondary | ICD-10-CM

## 2024-05-11 NOTE — Patient Instructions (Addendum)
 Medication Instructions:   Continue current cardiac medications.   *If you need a refill on your cardiac medications before your next appointment, please call your pharmacy*  Lab Work: Lipid Panel and CMET   If you have labs (blood work) drawn today and your tests are completely normal, you will receive your results only by: MyChart Message (if you have MyChart) OR A paper copy in the mail If you have any lab test that is abnormal or we need to change your treatment, we will call you to review the results.   Follow-Up: At [facility], you and your health needs are our priority.  As part of our continuing mission to provide you with exceptional heart care, our providers are all part of one team.  This team includes your primary Cardiologist (physician) and Advanced Practice Providers or APPs (Physician Assistants and Nurse Practitioners) who all work together to provide you with the care you need, when you need it.  Your next appointment:   6 month(s)  Provider:   You may see Alvan Carrier, MD or one of the following Advanced Practice Providers on your designated Care Team:   Laymon Qua, PA-C  Scotesia Carlisle, [place] Olivia Pavy, [place]     We recommend signing up for the patient portal called MyChart.  Sign up information is provided on this After Visit Summary.  MyChart is used to connect with patients for Virtual Visits (Telemedicine).  Patients are able to view lab/test results, encounter notes, upcoming appointments, etc.  Non-urgent messages can be sent to your provider as well.   To learn more about what you can do with MyChart, go to [email or web address].

## 2024-05-12 ENCOUNTER — Other Ambulatory Visit: Payer: Self-pay | Admitting: Physician Assistant

## 2024-05-12 DIAGNOSIS — Z952 Presence of prosthetic heart valve: Secondary | ICD-10-CM

## 2024-08-28 ENCOUNTER — Other Ambulatory Visit (HOSPITAL_COMMUNITY)

## 2024-08-28 ENCOUNTER — Ambulatory Visit: Admitting: Physician Assistant

## 2024-09-14 ENCOUNTER — Ambulatory Visit: Admitting: "Endocrinology

## 2024-12-27 ENCOUNTER — Ambulatory Visit: Admitting: Neurology
